# Patient Record
Sex: Female | Born: 1945 | Race: Black or African American | Hispanic: No | Marital: Single | Smoking: Never smoker
Health system: Southern US, Community
[De-identification: ages and names within clinical notes are randomized; demographics above are authoritative.]

## PROBLEM LIST (undated history)

## (undated) DIAGNOSIS — F0390 Unspecified dementia without behavioral disturbance: Secondary | ICD-10-CM

## (undated) DIAGNOSIS — E079 Disorder of thyroid, unspecified: Secondary | ICD-10-CM

## (undated) DIAGNOSIS — H4720 Unspecified optic atrophy: Secondary | ICD-10-CM

## (undated) DIAGNOSIS — R1310 Dysphagia, unspecified: Secondary | ICD-10-CM

## (undated) DIAGNOSIS — E785 Hyperlipidemia, unspecified: Secondary | ICD-10-CM

## (undated) DIAGNOSIS — H269 Unspecified cataract: Secondary | ICD-10-CM

## (undated) DIAGNOSIS — K029 Dental caries, unspecified: Secondary | ICD-10-CM

## (undated) DIAGNOSIS — E039 Hypothyroidism, unspecified: Secondary | ICD-10-CM

## (undated) DIAGNOSIS — A419 Sepsis, unspecified organism: Secondary | ICD-10-CM

## (undated) DIAGNOSIS — I2510 Atherosclerotic heart disease of native coronary artery without angina pectoris: Secondary | ICD-10-CM

## (undated) DIAGNOSIS — R569 Unspecified convulsions: Secondary | ICD-10-CM

## (undated) DIAGNOSIS — E7800 Pure hypercholesterolemia, unspecified: Secondary | ICD-10-CM

## (undated) DIAGNOSIS — I959 Hypotension, unspecified: Secondary | ICD-10-CM

## (undated) DIAGNOSIS — D649 Anemia, unspecified: Secondary | ICD-10-CM

## (undated) DIAGNOSIS — I509 Heart failure, unspecified: Secondary | ICD-10-CM

## (undated) DIAGNOSIS — G8220 Paraplegia, unspecified: Secondary | ICD-10-CM

## (undated) DIAGNOSIS — K219 Gastro-esophageal reflux disease without esophagitis: Secondary | ICD-10-CM

## (undated) DIAGNOSIS — I209 Angina pectoris, unspecified: Secondary | ICD-10-CM

## (undated) DIAGNOSIS — I480 Paroxysmal atrial fibrillation: Secondary | ICD-10-CM

## (undated) HISTORY — DX: Disorder of thyroid, unspecified: E07.9

## (undated) HISTORY — DX: Unspecified convulsions: R56.9

## (undated) HISTORY — PX: OTHER SURGICAL HISTORY: SHX169

## (undated) HISTORY — DX: Paraplegia, unspecified: G82.20

## (undated) HISTORY — DX: Unspecified cataract: H26.9

## (undated) HISTORY — PX: GASTROSTOMY TUBE PLACEMENT: SHX655

## (undated) HISTORY — DX: Pure hypercholesterolemia, unspecified: E78.00

## (undated) HISTORY — DX: Atherosclerotic heart disease of native coronary artery without angina pectoris: I25.10

---

## 1998-02-02 ENCOUNTER — Encounter (HOSPITAL_COMMUNITY): Admission: RE | Admit: 1998-02-02 | Discharge: 1998-05-05 | Payer: Self-pay | Admitting: Family Medicine

## 1999-05-20 ENCOUNTER — Emergency Department (HOSPITAL_COMMUNITY): Admission: EM | Admit: 1999-05-20 | Discharge: 1999-05-20 | Payer: Self-pay | Admitting: Emergency Medicine

## 1999-10-22 ENCOUNTER — Emergency Department (HOSPITAL_COMMUNITY): Admission: EM | Admit: 1999-10-22 | Discharge: 1999-10-23 | Payer: Self-pay | Admitting: *Deleted

## 2000-06-12 ENCOUNTER — Encounter: Payer: Self-pay | Admitting: Family Medicine

## 2000-06-12 ENCOUNTER — Encounter: Admission: RE | Admit: 2000-06-12 | Discharge: 2000-06-12 | Payer: Self-pay | Admitting: Family Medicine

## 2001-07-04 ENCOUNTER — Encounter: Admission: RE | Admit: 2001-07-04 | Discharge: 2001-07-04 | Payer: Self-pay | Admitting: Obstetrics & Gynecology

## 2001-07-04 ENCOUNTER — Encounter: Payer: Self-pay | Admitting: Family Medicine

## 2002-08-05 ENCOUNTER — Encounter: Admission: RE | Admit: 2002-08-05 | Discharge: 2002-08-05 | Payer: Self-pay | Admitting: Family Medicine

## 2003-02-15 ENCOUNTER — Encounter: Payer: Self-pay | Admitting: Family Medicine

## 2003-02-15 ENCOUNTER — Encounter: Admission: RE | Admit: 2003-02-15 | Discharge: 2003-02-15 | Payer: Self-pay | Admitting: Family Medicine

## 2004-02-11 ENCOUNTER — Ambulatory Visit (HOSPITAL_COMMUNITY): Admission: RE | Admit: 2004-02-11 | Discharge: 2004-02-11 | Payer: Self-pay | Admitting: Urology

## 2004-03-31 ENCOUNTER — Encounter: Admission: RE | Admit: 2004-03-31 | Discharge: 2004-03-31 | Payer: Self-pay | Admitting: Family Medicine

## 2005-04-04 ENCOUNTER — Encounter: Admission: RE | Admit: 2005-04-04 | Discharge: 2005-04-04 | Payer: Self-pay | Admitting: Family Medicine

## 2006-05-17 ENCOUNTER — Encounter: Admission: RE | Admit: 2006-05-17 | Discharge: 2006-05-17 | Payer: Self-pay | Admitting: Family Medicine

## 2006-08-26 ENCOUNTER — Encounter: Admission: RE | Admit: 2006-08-26 | Discharge: 2006-08-26 | Payer: Self-pay | Admitting: Family Medicine

## 2006-09-10 ENCOUNTER — Encounter: Admission: RE | Admit: 2006-09-10 | Discharge: 2006-09-10 | Payer: Self-pay | Admitting: Family Medicine

## 2006-10-23 ENCOUNTER — Emergency Department (HOSPITAL_COMMUNITY): Admission: EM | Admit: 2006-10-23 | Discharge: 2006-10-23 | Payer: Self-pay | Admitting: Emergency Medicine

## 2006-10-25 ENCOUNTER — Emergency Department (HOSPITAL_COMMUNITY): Admission: EM | Admit: 2006-10-25 | Discharge: 2006-10-26 | Payer: Self-pay | Admitting: Emergency Medicine

## 2007-05-16 ENCOUNTER — Encounter: Admission: RE | Admit: 2007-05-16 | Discharge: 2007-05-16 | Payer: Self-pay | Admitting: Family Medicine

## 2007-05-19 ENCOUNTER — Encounter: Admission: RE | Admit: 2007-05-19 | Discharge: 2007-05-19 | Payer: Self-pay | Admitting: Family Medicine

## 2007-05-30 ENCOUNTER — Encounter: Admission: RE | Admit: 2007-05-30 | Discharge: 2007-05-30 | Payer: Self-pay | Admitting: Family Medicine

## 2007-06-04 ENCOUNTER — Encounter: Admission: RE | Admit: 2007-06-04 | Discharge: 2007-06-04 | Payer: Self-pay | Admitting: Family Medicine

## 2007-06-20 ENCOUNTER — Encounter: Admission: RE | Admit: 2007-06-20 | Discharge: 2007-06-20 | Payer: Self-pay | Admitting: Family Medicine

## 2007-12-05 ENCOUNTER — Ambulatory Visit (HOSPITAL_COMMUNITY): Admission: RE | Admit: 2007-12-05 | Discharge: 2007-12-05 | Payer: Self-pay | Admitting: Family Medicine

## 2008-01-14 ENCOUNTER — Encounter: Admission: RE | Admit: 2008-01-14 | Discharge: 2008-01-14 | Payer: Self-pay | Admitting: Family Medicine

## 2008-05-24 ENCOUNTER — Encounter: Admission: RE | Admit: 2008-05-24 | Discharge: 2008-05-24 | Payer: Self-pay | Admitting: Family Medicine

## 2008-06-29 ENCOUNTER — Ambulatory Visit (HOSPITAL_COMMUNITY): Admission: RE | Admit: 2008-06-29 | Discharge: 2008-06-29 | Payer: Self-pay | Admitting: Family Medicine

## 2008-09-27 ENCOUNTER — Encounter: Admission: RE | Admit: 2008-09-27 | Discharge: 2008-09-27 | Payer: Self-pay | Admitting: Family Medicine

## 2008-11-11 ENCOUNTER — Ambulatory Visit: Payer: Self-pay | Admitting: Gynecology

## 2008-11-11 ENCOUNTER — Encounter: Payer: Self-pay | Admitting: Gynecology

## 2008-11-11 ENCOUNTER — Other Ambulatory Visit: Admission: RE | Admit: 2008-11-11 | Discharge: 2008-11-11 | Payer: Self-pay | Admitting: Gynecology

## 2009-05-30 ENCOUNTER — Encounter: Admission: RE | Admit: 2009-05-30 | Discharge: 2009-05-30 | Payer: Self-pay | Admitting: Family Medicine

## 2010-08-24 ENCOUNTER — Encounter: Admission: RE | Admit: 2010-08-24 | Discharge: 2010-08-24 | Payer: Self-pay | Admitting: Family Medicine

## 2010-11-04 ENCOUNTER — Encounter: Payer: Self-pay | Admitting: Family Medicine

## 2010-11-05 ENCOUNTER — Encounter: Payer: Self-pay | Admitting: Family Medicine

## 2010-12-20 ENCOUNTER — Other Ambulatory Visit (HOSPITAL_COMMUNITY): Payer: Self-pay | Admitting: Gastroenterology

## 2010-12-20 DIAGNOSIS — R634 Abnormal weight loss: Secondary | ICD-10-CM

## 2010-12-29 ENCOUNTER — Ambulatory Visit (HOSPITAL_COMMUNITY)
Admission: RE | Admit: 2010-12-29 | Discharge: 2010-12-29 | Disposition: A | Discharge status: Home / Self Care | Payer: Medicare Other | Source: Ambulatory Visit | Attending: Gastroenterology | Admitting: Gastroenterology

## 2010-12-29 DIAGNOSIS — R918 Other nonspecific abnormal finding of lung field: Secondary | ICD-10-CM | POA: Insufficient documentation

## 2010-12-29 DIAGNOSIS — I517 Cardiomegaly: Secondary | ICD-10-CM | POA: Insufficient documentation

## 2010-12-29 DIAGNOSIS — N269 Renal sclerosis, unspecified: Secondary | ICD-10-CM | POA: Insufficient documentation

## 2010-12-29 DIAGNOSIS — R634 Abnormal weight loss: Secondary | ICD-10-CM | POA: Insufficient documentation

## 2010-12-29 DIAGNOSIS — J9819 Other pulmonary collapse: Secondary | ICD-10-CM | POA: Insufficient documentation

## 2010-12-29 DIAGNOSIS — M4120 Other idiopathic scoliosis, site unspecified: Secondary | ICD-10-CM | POA: Insufficient documentation

## 2010-12-29 MED ORDER — IOHEXOL 300 MG/ML  SOLN
100.0000 mL | Freq: Once | INTRAMUSCULAR | Status: AC | PRN
  Administered 2010-12-29: 100 mL via INTRAVENOUS

## 2011-03-02 NOTE — Op Note (Signed)
NAME:  Dawn [name], Dawn [name]                      ACCOUNT NO.:  [id number]   MEDICAL RECORD NO.:  [id number]                   PATIENT TYPE:  AMB   LOCATION:  DAY                                  FACILITY:  [facility]   PHYSICIAN:  Lindaann Slough, M.D.               DATE OF BIRTH:  03-Jul-1946   DATE OF PROCEDURE:  02/11/2004  DATE OF DISCHARGE:                                 OPERATIVE REPORT   PREOPERATIVE DIAGNOSIS:  Recurrent urinary tract infection and left  hydronephrosis.   POSTOPERATIVE DIAGNOSIS:  Recurrent urinary tract infection and left  hydronephrosis secondary to ureteropelvic junction obstruction and meatal  stenosis.   PROCEDURE:  Cystoscopy, urethral dilation, left retrograde pyelogram, and  insertion of double-J catheter.   SURGEON:  Lindaann Slough, M.D.   ANESTHESIA:  General anesthesia.   INDICATIONS FOR PROCEDURE:  The patient is a 65 year old female with  cerebral palsy who has a history of recurrent urinary tract infections.  Renal ultrasound showed mild left hydronephrosis.  The patient is scheduled  today for cystoscopy and retrograde pyelogram.   DESCRIPTION OF PROCEDURE:  Under general anesthesia, the patient was prepped  and draped and placed in the dorsal lithotomy position.  A #22 Wappler  cystoscope could not inserted in the bladder because of meatal stenosis.  The urethra was then dilated with #32-French sound.  Then, the cystoscope  was passed into the bladder.  The bladder mucosa was reddened.  There was no  stone or tumor in the bladder.  The ureters were in normal position and  shape with clear efflux.  A cone-tipped catheter was then passed through the  cystoscope into the left ureteral orifice.  Contrast had been injected  through the cone-tipped catheter.  The ureter was normal.  There was  narrowing of UPJ with dilation of the renal pelvis and the collecting  system.  The cone-tipped catheter was then removed.  A glide wire was passed  through  an open-end catheter and the glide wire was passed through the  cystoscope and into the ureter.  Then the glide wire was passed up to the  UPJ and the open-end catheter was advanced over the UPJ into the renal  pelvis.  The glide wire was then removed and replaced with a guide wire and  the open-end catheter was removed.  A #6-24 double-J catheter was then  passed over the guide wire.  The proximal curl of the double-J  catheter is in the renal pelvis.  The distal curl is in the bladder.  The  bladder was then emptied and cystoscope removed.   The patient tolerated the procedure well and left the OR in satisfactory  condition to the post-anesthesia care unit.  Lindaann Slough, M.D.    MN/MEDQ  D:  02/11/2004  T:  02/11/2004  Job:  [id number]   cc:   Teena Irani. Arlyce Dice, M.D.  [id number]  [place]  [place] [address]  Fax: [phone]

## 2011-08-15 ENCOUNTER — Other Ambulatory Visit: Payer: Self-pay | Admitting: Family Medicine

## 2011-08-15 DIAGNOSIS — Z1231 Encounter for screening mammogram for malignant neoplasm of breast: Secondary | ICD-10-CM

## 2011-09-13 ENCOUNTER — Ambulatory Visit
Admission: RE | Admit: 2011-09-13 | Discharge: 2011-09-13 | Disposition: A | Discharge status: Home / Self Care | Payer: Medicare Other | Source: Ambulatory Visit | Attending: Family Medicine | Admitting: Family Medicine

## 2011-09-13 DIAGNOSIS — Z1231 Encounter for screening mammogram for malignant neoplasm of breast: Secondary | ICD-10-CM

## 2012-01-03 ENCOUNTER — Encounter: Payer: Medicare Other | Admitting: Gynecology

## 2012-01-15 ENCOUNTER — Encounter: Payer: Medicare Other | Admitting: Gynecology

## 2012-02-12 ENCOUNTER — Ambulatory Visit (INDEPENDENT_AMBULATORY_CARE_PROVIDER_SITE_OTHER): Payer: Medicare Other | Admitting: Gynecology

## 2012-02-12 ENCOUNTER — Encounter: Payer: Self-pay | Admitting: Gynecology

## 2012-02-12 DIAGNOSIS — N952 Postmenopausal atrophic vaginitis: Secondary | ICD-10-CM

## 2012-02-12 DIAGNOSIS — M810 Age-related osteoporosis without current pathological fracture: Secondary | ICD-10-CM

## 2012-02-12 NOTE — Patient Instructions (Signed)
Follow-up in one year.

## 2012-02-12 NOTE — Progress Notes (Signed)
66 year old G0 follow up exam from [facility] with a number of issues to include paraparesis, seizure disorder, mental retardation, cerebral palsy, wheelchair-bound. She is not having any issues but it has been 3 years since her last exam and she presents for routine examination. No history of vaginal bleeding reported by the nursing staff. Recently had mammography November 2012. Pap smear 2010 was negative.  She does have a large midline scar the nursing staff is unsure why that was done and I was unable to locate this in her medical chart.  GYN exam with Sherrilyn Rist chaperone present Breasts:  Right/left without masses retractions discharge adenopathy. Abdomen:  Midline scar.  Without masses, tenderness, rebound, guarding or organomegaly. Pelvic:  External with old apparent left labia minora tear. Atrophic changes noted. BUS vagina atrophic. Bimanual without gross masses or tenderness. Somewhat limited by voluntary guarding. Rectovaginal exam is normal.  Assessment and plan:  15. 66 year old with grossly normal GYN exam. Given the lack of other symptoms to include bleeding or visual/palpable abnormalities I do not feel any further evaluation needed at this point. Pap smear was not done. Discussed with nurses who accompanied her at age 66 without a history of abnormalities and given her clinical situation I feel no further Pap smears needed.  Did recommend though clinical exam annually particularly with breast exam and cursory pelvic. Need to report any vaginal bleeding or other abnormalities.  Continue with annual mammography. Colonoscopy at her primary physician's recommendation. Does have a history of osteoporosis with DEXA 2009 showing a T score of -3.7. I think given her situation some form of treatment particularly with her risk of falling would be prudent. If weekly bisphosphonate or monthly oral not possible alternatives such as once a year IV Reclast or twice yearly Prolia subcutaneous  injection would be suggested. I will leave that decision to her primary physician's discretion.

## 2012-05-30 ENCOUNTER — Other Ambulatory Visit (HOSPITAL_COMMUNITY): Payer: Self-pay | Admitting: Family Medicine

## 2012-05-30 DIAGNOSIS — R569 Unspecified convulsions: Secondary | ICD-10-CM

## 2012-06-25 ENCOUNTER — Ambulatory Visit (HOSPITAL_COMMUNITY)
Admission: RE | Admit: 2012-06-25 | Discharge: 2012-06-25 | Disposition: A | Discharge status: Home / Self Care | Payer: Medicare Other | Source: Ambulatory Visit | Attending: Family Medicine | Admitting: Family Medicine

## 2012-06-25 DIAGNOSIS — Z1389 Encounter for screening for other disorder: Secondary | ICD-10-CM | POA: Insufficient documentation

## 2012-06-25 DIAGNOSIS — R569 Unspecified convulsions: Secondary | ICD-10-CM

## 2012-06-25 NOTE — Progress Notes (Signed)
EEG completed.

## 2012-06-27 NOTE — Procedures (Signed)
EEG# [id number]  This routine EEG was requested in this 66 year old female with a history of mental retardation and frequent seizures.  She has episodes of body jerking followed by confusion and sleepiness.  Medications include Keppra and Depakote.  The EEG was done with the patient awake and drowsy.  Periods of maximal wakefulness were difficulty to interpret due to significant muscle artifact.  However, it did appear to be composed of low amplitude beta activities with no clear posterior dominant rhythm.  There was no photic driving response.  During drowsiness background activities were composed of diffuse theta and delta activities that had overlying frontally dominant as well as diffuse beta activities.  At times slowing appeared to be more pronounced over left hemisphere in particular the posterior regions while at other times the right hemisphere appeared slightly slower.  There did appear to be less fast activities consistently over the right hemisphere.  Infrequent left central parietal sharp waves were seen.  Clinical Interpretation:  This routine EEG done with the patient drowsy and awake is abnormal.  There are sharp waves best seen in the left central parietal region suggesting a possible source of seizures. There does appear to be multifocal slowing during drowsiness, but this is hard to discern due to generalized slowing related to the drowsy state.  An asymmetry of faster activities during drowsiness with faster activities being more prominent over the left hemisphere may suggest that the likely diffuse cortical dysfunction is worse over the right hemisphere.  Lupita Raider Modesto Charon, MD [facility], [place]

## 2012-07-08 ENCOUNTER — Ambulatory Visit (HOSPITAL_COMMUNITY): Admission: RE | Admit: 2012-07-08 | Payer: Medicare Other | Source: Ambulatory Visit

## 2012-07-29 ENCOUNTER — Ambulatory Visit (HOSPITAL_COMMUNITY)
Admission: RE | Admit: 2012-07-29 | Discharge: 2012-07-29 | Disposition: A | Discharge status: Home / Self Care | Payer: Medicare Other | Source: Ambulatory Visit | Attending: Family Medicine | Admitting: Family Medicine

## 2012-07-29 DIAGNOSIS — F79 Unspecified intellectual disabilities: Secondary | ICD-10-CM | POA: Insufficient documentation

## 2012-07-29 DIAGNOSIS — R011 Cardiac murmur, unspecified: Secondary | ICD-10-CM | POA: Insufficient documentation

## 2012-07-29 DIAGNOSIS — G809 Cerebral palsy, unspecified: Secondary | ICD-10-CM | POA: Insufficient documentation

## 2012-07-29 NOTE — Progress Notes (Signed)
  Echocardiogram 2D Echocardiogram has been performed.  Azarian Starace 07/29/2012, 1:40 PM

## 2012-08-27 ENCOUNTER — Other Ambulatory Visit: Payer: Self-pay | Admitting: Family Medicine

## 2012-08-27 DIAGNOSIS — Z1231 Encounter for screening mammogram for malignant neoplasm of breast: Secondary | ICD-10-CM

## 2012-10-10 ENCOUNTER — Ambulatory Visit: Payer: Medicare Other

## 2012-10-30 ENCOUNTER — Ambulatory Visit: Payer: Medicare Other

## 2012-11-06 ENCOUNTER — Ambulatory Visit: Payer: Medicare Other

## 2012-11-14 ENCOUNTER — Ambulatory Visit: Payer: Medicare Other

## 2012-12-05 ENCOUNTER — Ambulatory Visit
Admission: RE | Admit: 2012-12-05 | Discharge: 2012-12-05 | Disposition: A | Discharge status: Home / Self Care | Payer: Medicare Other | Source: Ambulatory Visit | Attending: Family Medicine | Admitting: Family Medicine

## 2012-12-05 DIAGNOSIS — Z1231 Encounter for screening mammogram for malignant neoplasm of breast: Secondary | ICD-10-CM

## 2013-03-23 ENCOUNTER — Encounter: Payer: Self-pay | Admitting: Gynecology

## 2013-10-28 ENCOUNTER — Inpatient Hospital Stay (HOSPITAL_COMMUNITY)
Admission: EM | Admit: 2013-10-28 | Discharge: 2013-11-02 | DRG: 281 | Disposition: A | Discharge status: Home / Self Care | Payer: Medicare Other | Attending: Internal Medicine | Admitting: Internal Medicine

## 2013-10-28 ENCOUNTER — Emergency Department (HOSPITAL_COMMUNITY): Payer: Medicare Other

## 2013-10-28 ENCOUNTER — Encounter (HOSPITAL_COMMUNITY): Payer: Self-pay | Admitting: Emergency Medicine

## 2013-10-28 DIAGNOSIS — M810 Age-related osteoporosis without current pathological fracture: Secondary | ICD-10-CM | POA: Diagnosis present

## 2013-10-28 DIAGNOSIS — I5030 Unspecified diastolic (congestive) heart failure: Secondary | ICD-10-CM

## 2013-10-28 DIAGNOSIS — E162 Hypoglycemia, unspecified: Secondary | ICD-10-CM | POA: Diagnosis not present

## 2013-10-28 DIAGNOSIS — G40909 Epilepsy, unspecified, not intractable, without status epilepticus: Secondary | ICD-10-CM

## 2013-10-28 DIAGNOSIS — H4720 Unspecified optic atrophy: Secondary | ICD-10-CM | POA: Diagnosis present

## 2013-10-28 DIAGNOSIS — N390 Urinary tract infection, site not specified: Secondary | ICD-10-CM | POA: Diagnosis present

## 2013-10-28 DIAGNOSIS — R799 Abnormal finding of blood chemistry, unspecified: Secondary | ICD-10-CM

## 2013-10-28 DIAGNOSIS — E039 Hypothyroidism, unspecified: Secondary | ICD-10-CM

## 2013-10-28 DIAGNOSIS — Z79899 Other long term (current) drug therapy: Secondary | ICD-10-CM

## 2013-10-28 DIAGNOSIS — D696 Thrombocytopenia, unspecified: Secondary | ICD-10-CM

## 2013-10-28 DIAGNOSIS — E785 Hyperlipidemia, unspecified: Secondary | ICD-10-CM

## 2013-10-28 DIAGNOSIS — R7989 Other specified abnormal findings of blood chemistry: Secondary | ICD-10-CM

## 2013-10-28 DIAGNOSIS — R627 Adult failure to thrive: Secondary | ICD-10-CM | POA: Diagnosis present

## 2013-10-28 DIAGNOSIS — D649 Anemia, unspecified: Secondary | ICD-10-CM

## 2013-10-28 DIAGNOSIS — B961 Klebsiella pneumoniae [K. pneumoniae] as the cause of diseases classified elsewhere: Secondary | ICD-10-CM | POA: Diagnosis present

## 2013-10-28 DIAGNOSIS — K219 Gastro-esophageal reflux disease without esophagitis: Secondary | ICD-10-CM | POA: Diagnosis present

## 2013-10-28 DIAGNOSIS — I959 Hypotension, unspecified: Secondary | ICD-10-CM

## 2013-10-28 DIAGNOSIS — E860 Dehydration: Secondary | ICD-10-CM

## 2013-10-28 DIAGNOSIS — I214 Non-ST elevation (NSTEMI) myocardial infarction: Secondary | ICD-10-CM

## 2013-10-28 DIAGNOSIS — R1310 Dysphagia, unspecified: Secondary | ICD-10-CM | POA: Diagnosis present

## 2013-10-28 DIAGNOSIS — H269 Unspecified cataract: Secondary | ICD-10-CM | POA: Diagnosis present

## 2013-10-28 DIAGNOSIS — E870 Hyperosmolality and hypernatremia: Secondary | ICD-10-CM | POA: Diagnosis present

## 2013-10-28 DIAGNOSIS — F79 Unspecified intellectual disabilities: Secondary | ICD-10-CM

## 2013-10-28 DIAGNOSIS — E46 Unspecified protein-calorie malnutrition: Secondary | ICD-10-CM | POA: Diagnosis present

## 2013-10-28 DIAGNOSIS — I4891 Unspecified atrial fibrillation: Principal | ICD-10-CM

## 2013-10-28 DIAGNOSIS — R778 Other specified abnormalities of plasma proteins: Secondary | ICD-10-CM

## 2013-10-28 HISTORY — DX: Angina pectoris, unspecified: I20.9

## 2013-10-28 HISTORY — DX: Hypothyroidism, unspecified: E03.9

## 2013-10-28 LAB — CBC WITH DIFFERENTIAL/PLATELET
Basophils Absolute: 0 10*3/uL (ref 0.0–0.1)
Basophils Relative: 0 % (ref 0–1)
EOS ABS: 0.1 10*3/uL (ref 0.0–0.7)
EOS PCT: 2 % (ref 0–5)
HCT: 32.2 % — ABNORMAL LOW (ref 36.0–46.0)
Hemoglobin: 10.7 g/dL — ABNORMAL LOW (ref 12.0–15.0)
LYMPHS ABS: 2.1 10*3/uL (ref 0.7–4.0)
Lymphocytes Relative: 28 % (ref 12–46)
MCH: 33.5 pg (ref 26.0–34.0)
MCHC: 33.2 g/dL (ref 30.0–36.0)
MCV: 100.9 fL — ABNORMAL HIGH (ref 78.0–100.0)
Monocytes Absolute: 0.6 10*3/uL (ref 0.1–1.0)
Monocytes Relative: 7 % (ref 3–12)
NEUTROS PCT: 63 % (ref 43–77)
Neutro Abs: 4.8 10*3/uL (ref 1.7–7.7)
PLATELETS: 95 10*3/uL — AB (ref 150–400)
RBC: 3.19 MIL/uL — AB (ref 3.87–5.11)
RDW: 15.7 % — AB (ref 11.5–15.5)
WBC: 7.6 10*3/uL (ref 4.0–10.5)

## 2013-10-28 LAB — COMPREHENSIVE METABOLIC PANEL
ALT: 36 U/L — AB (ref 0–35)
AST: 42 U/L — AB (ref 0–37)
Albumin: 2.5 g/dL — ABNORMAL LOW (ref 3.5–5.2)
Alkaline Phosphatase: 133 U/L — ABNORMAL HIGH (ref 39–117)
BUN: 26 mg/dL — ABNORMAL HIGH (ref 6–23)
CO2: 29 mEq/L (ref 19–32)
Calcium: 9.2 mg/dL (ref 8.4–10.5)
Chloride: 107 mEq/L (ref 96–112)
Creatinine, Ser: 0.6 mg/dL (ref 0.50–1.10)
GFR calc non Af Amer: >90 mL/min (ref >90)
GLUCOSE: 109 mg/dL — AB (ref 70–99)
POTASSIUM: 4 meq/L (ref 3.7–5.3)
SODIUM: 148 meq/L — AB (ref 137–147)
TOTAL PROTEIN: 6.7 g/dL (ref 6.0–8.3)
Total Bilirubin: <0.2 mg/dL — ABNORMAL LOW (ref 0.3–1.2)

## 2013-10-28 LAB — PROTIME-INR
INR: 1.05 (ref 0.00–1.49)
Prothrombin Time: 13.5 seconds (ref 11.6–15.2)

## 2013-10-28 LAB — TROPONIN I
TROPONIN I: 1.94 ng/mL — AB (ref <0.30)
Troponin I: 0.31 ng/mL (ref <0.30)

## 2013-10-28 LAB — GLUCOSE, CAPILLARY
Glucose-Capillary: 104 mg/dL — ABNORMAL HIGH (ref 70–99)
Glucose-Capillary: 67 mg/dL — ABNORMAL LOW (ref 70–99)
Glucose-Capillary: 99 mg/dL (ref 70–99)

## 2013-10-28 LAB — VALPROIC ACID LEVEL: Valproic Acid Lvl: 63.5 ug/mL (ref 50.0–100.0)

## 2013-10-28 LAB — MAGNESIUM: Magnesium: 1.8 mg/dL (ref 1.5–2.5)

## 2013-10-28 LAB — APTT: APTT: 32 s (ref 24–37)

## 2013-10-28 LAB — PRO B NATRIURETIC PEPTIDE: Pro B Natriuretic peptide (BNP): 488.2 pg/mL — ABNORMAL HIGH (ref 0–125)

## 2013-10-28 LAB — MRSA PCR SCREENING: MRSA by PCR: NEGATIVE

## 2013-10-28 MED ORDER — SODIUM CHLORIDE 0.9 % IV BOLUS (SEPSIS)
1000.0000 mL | Freq: Once | INTRAVENOUS | Status: AC
  Administered 2013-10-28: 1000 mL via INTRAVENOUS

## 2013-10-28 MED ORDER — HEPARIN (PORCINE) IN NACL 100-0.45 UNIT/ML-% IJ SOLN
600.0000 [IU]/h | INTRAMUSCULAR | Status: DC
  Administered 2013-10-28: 750 [IU]/h via INTRAVENOUS
  Filled 2013-10-28: qty 250

## 2013-10-28 MED ORDER — DIGOXIN 125 MCG PO TABS
0.1250 mg | ORAL_TABLET | Freq: Once | ORAL | Status: AC
  Administered 2013-10-29: 0.125 mg via ORAL
  Filled 2013-10-28: qty 1

## 2013-10-28 MED ORDER — LEVETIRACETAM 750 MG PO TABS
750.0000 mg | ORAL_TABLET | Freq: Three times a day (TID) | ORAL | Status: DC
  Administered 2013-10-28 – 2013-11-02 (×14): 750 mg via ORAL
  Filled 2013-10-28 (×17): qty 1

## 2013-10-28 MED ORDER — DEXTROSE-NACL 5-0.45 % IV SOLN
INTRAVENOUS | Status: DC
  Administered 2013-10-28: 21:00:00 via INTRAVENOUS

## 2013-10-28 MED ORDER — ATORVASTATIN CALCIUM 10 MG PO TABS
10.0000 mg | ORAL_TABLET | Freq: Every day | ORAL | Status: DC
  Administered 2013-10-28 – 2013-11-02 (×6): 10 mg via ORAL
  Filled 2013-10-28 (×6): qty 1

## 2013-10-28 MED ORDER — ASPIRIN EC 81 MG PO TBEC
81.0000 mg | DELAYED_RELEASE_TABLET | Freq: Every day | ORAL | Status: DC
  Administered 2013-10-28 – 2013-11-02 (×6): 81 mg via ORAL
  Filled 2013-10-28 (×6): qty 1

## 2013-10-28 MED ORDER — SODIUM CHLORIDE 0.9 % IV SOLN
INTRAVENOUS | Status: DC
  Administered 2013-10-28: 16:00:00 via INTRAVENOUS
  Administered 2013-10-29: 125 mL/h via INTRAVENOUS
  Administered 2013-10-29 – 2013-10-30 (×3): via INTRAVENOUS
  Administered 2013-10-30: 125 mL/h via INTRAVENOUS
  Administered 2013-10-31: 03:00:00 via INTRAVENOUS

## 2013-10-28 MED ORDER — DILTIAZEM HCL 100 MG IV SOLR
5.0000 mg/h | INTRAVENOUS | Status: DC
  Filled 2013-10-28: qty 100

## 2013-10-28 MED ORDER — LEVOTHYROXINE SODIUM 75 MCG PO TABS
75.0000 ug | ORAL_TABLET | Freq: Every day | ORAL | Status: DC
  Administered 2013-10-29 – 2013-11-02 (×5): 75 ug via ORAL
  Filled 2013-10-28 (×5): qty 1

## 2013-10-28 MED ORDER — CAMPHOR-MENTHOL 0.5-0.5 % EX LOTN: 1.0000 "application " | TOPICAL_LOTION | CUTANEOUS | Status: DC | PRN

## 2013-10-28 MED ORDER — ACETAMINOPHEN 325 MG PO TABS
650.0000 mg | ORAL_TABLET | Freq: Four times a day (QID) | ORAL | Status: DC | PRN
  Filled 2013-10-28: qty 2

## 2013-10-28 MED ORDER — ACETAMINOPHEN 650 MG RE SUPP: 650.0000 mg | Freq: Four times a day (QID) | RECTAL | Status: DC | PRN

## 2013-10-28 MED ORDER — ONDANSETRON HCL 4 MG/2ML IJ SOLN: 4.0000 mg | Freq: Four times a day (QID) | INTRAMUSCULAR | Status: DC | PRN

## 2013-10-28 MED ORDER — ONDANSETRON HCL 4 MG PO TABS: 4.0000 mg | ORAL_TABLET | Freq: Four times a day (QID) | ORAL | Status: DC | PRN

## 2013-10-28 MED ORDER — CALCIUM CARBONATE-VITAMIN D 500-200 MG-UNIT PO TABS
1.0000 | ORAL_TABLET | Freq: Every day | ORAL | Status: DC
  Administered 2013-10-29 – 2013-11-02 (×5): 1 via ORAL
  Filled 2013-10-28 (×5): qty 1

## 2013-10-28 MED ORDER — DIVALPROEX SODIUM 500 MG PO DR TAB
750.0000 mg | DELAYED_RELEASE_TABLET | Freq: Three times a day (TID) | ORAL | Status: DC
  Administered 2013-10-28 – 2013-11-02 (×14): 750 mg via ORAL
  Filled 2013-10-28 (×18): qty 1

## 2013-10-28 MED ORDER — VITAMIN C 500 MG PO TABS
500.0000 mg | ORAL_TABLET | Freq: Three times a day (TID) | ORAL | Status: DC
  Administered 2013-10-28 – 2013-11-02 (×14): 500 mg via ORAL
  Filled 2013-10-28 (×17): qty 1

## 2013-10-28 MED ORDER — DIGOXIN 0.25 MG/ML IJ SOLN: 0.1250 mg | Freq: Once | INTRAMUSCULAR | Status: DC

## 2013-10-28 MED ORDER — PANTOPRAZOLE SODIUM 40 MG PO TBEC
40.0000 mg | DELAYED_RELEASE_TABLET | Freq: Every day | ORAL | Status: DC
  Administered 2013-10-29 – 2013-11-02 (×4): 40 mg via ORAL
  Filled 2013-10-28 (×4): qty 1

## 2013-10-28 MED ORDER — DILTIAZEM HCL 100 MG IV SOLR: 5.0000 mg/h | Freq: Once | INTRAVENOUS | Status: DC

## 2013-10-28 MED ORDER — HEPARIN BOLUS VIA INFUSION
2500.0000 [IU] | Freq: Once | INTRAVENOUS | Status: AC
Start: 2013-10-28 — End: 2013-10-28
  Administered 2013-10-28: 2500 [IU] via INTRAVENOUS
  Filled 2013-10-28: qty 2500

## 2013-10-28 NOTE — ED Provider Notes (Signed)
CSN: [id number]     Arrival date & time 10/28/13  1513 History   First MD Initiated Contact with Patient 10/28/13 1517     Chief Complaint  Patient presents with  . Seizures   (Consider location/radiation/quality/duration/timing/severity/associated sxs/prior Treatment) Patient is a 68 y.o. female presenting with seizures. The history is provided by the EMS personnel. The history is limited by the condition of the patient.  Seizures  patient here after having a possible witnessed seizure at the nursing home. Patient does have a history of seizures. According to EMS, patient is noted in rocking back and forth. They observed was felt to be his seizure activity and did not look like a focal or generalized seizure. There was no postictal period. EMS did do an EKG which showed the patient to be in what is a new onset atrial fibrillation. Patient has a history of MR and cannot give any further history  Past Medical History  Diagnosis Date  . Seizures   . Osteoporosis   . Thyroid disease     hypothyroid  . High cholesterol   . Cataracts, bilateral   . Optic atrophy   . Paraparesis     mild   Past Surgical History  Procedure Laterality Date  . Midline incision     History reviewed. No pertinent family history. History  Substance Use Topics  . Smoking status: Never Smoker   . Smokeless tobacco: Never Used  . Alcohol Use: No   OB History   Grav Para Term Preterm Abortions TAB SAB Ect Mult Living   0              Review of Systems  Unable to perform ROS Neurological: Positive for seizures.    Allergies  Ppd  Home Medications   Current Outpatient Rx  Name  Route  Sig  Dispense  Refill  . atorvastatin (LIPITOR) 10 MG tablet   Oral   Take 10 mg by mouth daily.         . calcium-vitamin D (OYSTER CALCIUM 500 + D) 500-200 MG-UNIT per tablet   Oral   Take 1 tablet by mouth daily.         . camphor-menthol (SARNA) lotion   Topical   Apply 1 application topically 2 (two)  times daily.         . clobetasol (TEMOVATE) 0.05 % external solution   Topical   Apply 1 application topically at bedtime.          . divalproex (DEPAKOTE) 250 MG DR tablet   Oral   Take 750 mg by mouth 3 (three) times daily.          Marland Kitchen levETIRAcetam (KEPPRA) 750 MG tablet   Oral   Take 750 mg by mouth 3 (three) times daily.          Marland Kitchen levothyroxine (SYNTHROID, LEVOTHROID) 75 MCG tablet   Oral   Take 75 mcg by mouth daily.         . nitrofurantoin (MACRODANTIN) 50 MG capsule   Oral   Take 50 mg by mouth at bedtime.          Marland Kitchen tolnaftate (TINACTIN) 1 % spray   Topical   Apply 1 application topically at bedtime.          . vitamin C (ASCORBIC ACID) 500 MG tablet   Oral   Take 500 mg by mouth 3 (three) times daily.           There  were no vitals taken for this visit. Physical Exam  Nursing note and vitals reviewed. Constitutional: She appears well-developed and well-nourished.  Non-toxic appearance. No distress.  HENT:  Head: Normocephalic and atraumatic.  Eyes: Conjunctivae, EOM and lids are normal. Pupils are equal, round, and reactive to light.  Neck: Normal range of motion. Neck supple. No tracheal deviation present. No mass present.  Cardiovascular: Normal rate, regular rhythm and normal heart sounds.  Exam reveals no gallop.   No murmur heard. Pulmonary/Chest: Effort normal and breath sounds normal. No stridor. No respiratory distress. She has no decreased breath sounds. She has no wheezes. She has no rhonchi. She has no rales.  Abdominal: Soft. Normal appearance and bowel sounds are normal. She exhibits no distension. There is no tenderness. There is no rebound and no CVA tenderness.  Musculoskeletal: Normal range of motion. She exhibits no edema and no tenderness.  Neurological: She is alert. She exhibits normal muscle tone. GCS eye subscore is 4. GCS verbal subscore is 3. GCS motor subscore is 6.  Patient moves all 4 extremities  Skin: Skin is warm  and dry. No abrasion and no rash noted.  Psychiatric: Her affect is blunt. She is slowed.    ED Course  Procedures (including critical care time) Labs Review Labs Reviewed  TROPONIN I  CBC WITH DIFFERENTIAL  COMPREHENSIVE METABOLIC PANEL  VALPROIC ACID LEVEL  PROTIME-INR  APTT   Imaging Review Dg Chest Port 1 View  10/28/2013   CLINICAL DATA:  Seizure with pain  EXAM: PORTABLE CHEST - 1 VIEW  COMPARISON:  Chest CT December 29, 2010 and chest radiograph January 14, 2008  FINDINGS: The degree of inspiration is shallow. There is no edema or consolidation. Heart size and pulmonary vascularity are normal. No pneumothorax. No adenopathy. No bone lesions.  IMPRESSION: No edema or consolidation.  Note shallow degree of inspiration.   Electronically Signed   By: Lowella Grip M.D.   On: 10/28/2013 15:47    EKG Interpretation    Date/Time:  Wednesday October 28 2013 15:26:56 EST Ventricular Rate:  121 PR Interval:    QRS Duration: 92 QT Interval:  274 QTC Calculation: 389 R Axis:   30 Text Interpretation:  Atrial fibrillation Abnormal R-wave progression, early transition Repol abnrm suggests ischemia, diffuse leads Confirmed by [place] ([id number]) on 10/28/2013 3:55:58 PM            MDM  No diagnosis found. Patient has no prior history of CHF but does have an ejection fraction of 80% as noted by cardiac echo from 2013.  5:58 PM Patient given IV fluids here and remains in atrial fibrillation with rapid ventricular rate response of 240. She was started on Cardizem drip at 5 mg per hour. He was reassessed multiple times and heart rate has only slightly decreased. Cardizem drip was titrated up to 10 mg. She was also given an additional bolus of IV saline. I consult to cardiology due 2 the patient's elevated troponin. They recommended to continue to titrate the diltiazem drip to control the patient's rate. I spoke with the hospitalist service and they will come to admit the  patient.  CRITICAL CARE Performed by: Leota Jacobsen Total critical care time: 60 Critical care time was exclusive of separately billable procedures and treating other patients. Critical care was necessary to treat or prevent imminent or life-threatening deterioration. Critical care was time spent personally by me on the following activities: development of treatment plan with patient and/or surrogate as  well as nursing, discussions with consultants, evaluation of patient's response to treatment, examination of patient, obtaining history from patient or surrogate, ordering and performing treatments and interventions, ordering and review of laboratory studies, ordering and review of radiographic studies, pulse oximetry and re-evaluation of patient's condition.   Leota Jacobsen, MD 10/28/13 [phone]

## 2013-10-28 NOTE — H&P (Signed)
[facility] History and Physical  Dawn [name] [id number] DOB: [birth year] DOA: 10/28/2013  Referring physician: Dr. Zenia Resides PCP: Clint Guy, MD   Chief Complaint: a. Fib with RVR  HPI: Dawn [name] is a 68 y.o. female with pmh significant for HLD, MR and seizure disorder brought to ED from group home due to changes in usual behavior. Initially thought patient had seizure activity, but that was r/o by EMS as patient was able to look for her name after been called during activity and also no post ictal; found to be on a.fib with RVR and per staff not drinking a lot in past 1-2 days. Patient w/o fever, chills, nausea, vomiting, hematemesis, melena or hematochezia.  In ED confirmed to be on a.fib with RVR and with slight elevation on Troponin. TRH called to admit for further evaluation and treatment. New onset a. Fib.   Review of Systems:  unbale to be reviewed due to patient underlying MR. Per PE and records negative except as mentioned on HPI.  Past Medical History  Diagnosis Date  . Seizures   . Osteoporosis   . Thyroid disease     hypothyroid  . High cholesterol   . Cataracts, bilateral   . Optic atrophy   . Paraparesis     mild   Past Surgical History  Procedure Laterality Date  . Midline incision     Social History:  reports that she has never smoked. She has never used smokeless tobacco. She reports that she does not drink alcohol. Her drug history is not on file.  Allergies  Allergen Reactions  . Ppd [Tuberculin Purified Protein Derivative]     Per MAR    Family history: unable to reviewed due to patient mental status    Prior to Admission medications   Medication Sig Start Date End Date Taking? Authorizing Provider  atorvastatin (LIPITOR) 10 MG tablet Take 10 mg by mouth daily.   Yes Historical Provider, MD  calcium-vitamin D (OYSTER CALCIUM 500 + D) 500-200 MG-UNIT per tablet Take 1 tablet by mouth daily.   Yes Historical Provider, MD   camphor-menthol Timoteo Ace) lotion Apply 1 application topically 2 (two) times daily.   Yes Historical Provider, MD  clobetasol (TEMOVATE) 0.05 % external solution Apply 1 application topically at bedtime.    Yes Historical Provider, MD  divalproex (DEPAKOTE) 250 MG DR tablet Take 750 mg by mouth 3 (three) times daily.    Yes Historical Provider, MD  levETIRAcetam (KEPPRA) 750 MG tablet Take 750 mg by mouth 3 (three) times daily.    Yes Historical Provider, MD  levothyroxine (SYNTHROID, LEVOTHROID) 75 MCG tablet Take 75 mcg by mouth daily.   Yes Historical Provider, MD  nitrofurantoin (MACRODANTIN) 50 MG capsule Take 50 mg by mouth at bedtime.    Yes Historical Provider, MD  tolnaftate (TINACTIN) 1 % spray Apply 1 application topically at bedtime.    Yes Historical Provider, MD  vitamin C (ASCORBIC ACID) 500 MG tablet Take 500 mg by mouth 3 (three) times daily.    Yes Historical Provider, MD   Physical Exam: Filed Vitals:   10/28/13 1907  BP: 82/61  Pulse: 144  Resp: 19    BP 82/61  Pulse 144  Resp 19  SpO2 99%  General:  Appears calm and comfortable; slight dryness of MM; no fever. Nonverbal at baseline Eyes: PERRL, normal lids, irises & conjunctiva ENT: no drainage out of ears or nostrils, slight dryness of MM, no erythema or exudates inside her  mouth. Neck: no LAD, masses or thyromegaly Cardiovascular: soft SEM, no rubs or gallops; irregular EKG and telemetry: a.fib with RVR on EKG and tele  Respiratory: CTA bilaterally, no w/r/r. Normal respiratory effort. Abdomen: soft, nt/nd; positive BS Musculoskeletal: grossly normal tone Bilaterally, no joint swelling Neurologic: no new focal deficit appreciated; patient exam limited due to MR and inability to follow commands.          Labs on Admission:  Basic Metabolic Panel:  Recent Labs Lab 10/28/13 1547  NA 148*  K 4.0  CL 107  CO2 29  GLUCOSE 109*  BUN 26*  CREATININE 0.60  CALCIUM 9.2   Liver Function Tests:  Recent  Labs Lab 10/28/13 1547  AST 42*  ALT 36*  ALKPHOS 133*  BILITOT <0.2*  PROT 6.7  ALBUMIN 2.5*   CBC:  Recent Labs Lab 10/28/13 1547  WBC 7.6  NEUTROABS 4.8  HGB 10.7*  HCT 32.2*  MCV 100.9*  PLT 95*   Cardiac Enzymes:  Recent Labs Lab 10/28/13 1547  TROPONINI 0.31*    BNP (last 3 results)  Recent Labs  10/28/13 1547  PROBNP 488.2*   CBG:  Recent Labs Lab 10/28/13 1531  GLUCAP 104*    Radiological Exams on Admission: Dg Chest Port 1 View  10/28/2013   CLINICAL DATA:  Seizure with pain  EXAM: PORTABLE CHEST - 1 VIEW  COMPARISON:  Chest CT December 29, 2010 and chest radiograph January 14, 2008  FINDINGS: The degree of inspiration is shallow. There is no edema or consolidation. Heart size and pulmonary vascularity are normal. No pneumothorax. No adenopathy. No bone lesions.  IMPRESSION: No edema or consolidation.  Note shallow degree of inspiration.   Electronically Signed   By: Lowella Grip M.D.   On: 10/28/2013 15:47    EKG:  Ventricular Rate: 121  PR Interval:  QRS Duration: 92  QT Interval: 274  QTC Calculation: 389  R Axis: 30  Text Interpretation: Atrial fibrillation Abnormal R-wave progression  Assessment/Plan 1-Atrial fibrillation with rapid ventricular response: new onset. -patient EKG with a. Fib with RVR; no prior hx of rhythm disorder -admit to stepdown -IVF's (as patient mild dehydrated and with hypernatremia) -cardizem drip and heparin drip -will check 2-D echo, troponin and Thyroid function. -will check UA (to r/o infections); CXR w/o acute cardiopulmonary process -will cycle cardiac enzymes  2-Seizure disorder: will closely monitor for any seizure activity. -depakote level WNL  3-MR (mental retardation): no verbal at baseline. Will provide support care. -once medically stable plan is for patient to go back to group home  4-Hypothyroidism: continue synthroid for now. Will check TSH  5-HLD (hyperlipidemia): continue  statins  6-Elevated troponin:slightly elevated at 0.31 due to a. Fib most likely. -will start ASA and continue statins -will cycle CE'z and check 2-D echo -no B-blocker given soft BP and needs for cardizem to control rate.  7-GERD and GI prophylaxis: started on PPI    8-Dysphagia: continue dysphagia 1 and thin liquids as provided in outpatient setting.   Cardiology consulted by ED (Dr. Gwenlyn Found)  Code Status: Full Family Communication: nurse from group home at bedside and group home charge nurse by phone Disposition Plan: admit to stepdown, LOS > 2 midnights, inpatient  Time spent: 50 minutes  Jonatan Wilsey [facility] Pager [phone]

## 2013-10-28 NOTE — ED Notes (Signed)
Pt comes from a [place] home for seizures, per facility pt had 5 seizures in 21mins. This was not typical of her other seizures. Upon EMS arrival they witnessed the pt having agitation and purposeful movement, when they called pt's name during episode she was able to turn in their direction. Pt is non verbal per norm. When pt was put on monitor pt showed A-fib and she has no hx. 22 LAC

## 2013-10-28 NOTE — ED Notes (Addendum)
Receiving Nurse to call back after report.

## 2013-10-28 NOTE — Progress Notes (Signed)
ANTICOAGULATION CONSULT NOTE - Initial Consult  Pharmacy Consult for heparin Indication: atrial fibrillation  Allergies  Allergen Reactions  . Ppd [Tuberculin Purified Protein Derivative]     Per [facility]    Patient Measurements: Heparin Dosing Weight: 52.6 kg  Vital Signs: BP: 82/61 mmHg (01/14 1907) Pulse Rate: 144 (01/14 1907)  Labs:  Recent Labs  10/28/13 1547  HGB 10.7*  HCT 32.2*  PLT 95*  APTT 32  LABPROT 13.5  INR 1.05  CREATININE 0.60  TROPONINI 0.31*    Medical History: Past Medical History  Diagnosis Date  . Seizures   . Osteoporosis   . Thyroid disease     hypothyroid  . High cholesterol   . Cataracts, bilateral   . Optic atrophy   . Paraparesis     mild    Medications:  Prescriptions prior to admission  Medication Sig Dispense Refill  . atorvastatin (LIPITOR) 10 MG tablet Take 10 mg by mouth daily.      . calcium-vitamin D (OYSTER CALCIUM 500 + D) 500-200 MG-UNIT per tablet Take 1 tablet by mouth daily.      . camphor-menthol (SARNA) lotion Apply 1 application topically 2 (two) times daily.      . clobetasol (TEMOVATE) 0.05 % external solution Apply 1 application topically at bedtime.       . divalproex (DEPAKOTE) 250 MG DR tablet Take 750 mg by mouth 3 (three) times daily.       Marland Kitchen levETIRAcetam (KEPPRA) 750 MG tablet Take 750 mg by mouth 3 (three) times daily.       Marland Kitchen levothyroxine (SYNTHROID, LEVOTHROID) 75 MCG tablet Take 75 mcg by mouth daily.      . nitrofurantoin (MACRODANTIN) 50 MG capsule Take 50 mg by mouth at bedtime.       Marland Kitchen tolnaftate (TINACTIN) 1 % spray Apply 1 application topically at bedtime.       . vitamin C (ASCORBIC ACID) 500 MG tablet Take 500 mg by mouth 3 (three) times daily.         Assessment: Pt presented with seizures-found new onset afib with rvr to 240.  Rx consulted to dose heparin.  Hgb 10.7, plts 95.  No bleeding noted.  Goal of Therapy:  INR 2-3 Heparin level 0.3-0.7 units/ml Monitor platelets by  anticoagulation protocol: Yes   Plan:  Heparin 2500 units bolus x1 Heparin drip 750 units/hr Daily HL/CBC  Hughes Better, PharmD, BCPS Clinical Pharmacist 10/28/2013 8:15 PM

## 2013-10-29 ENCOUNTER — Encounter (HOSPITAL_COMMUNITY): Payer: Self-pay

## 2013-10-29 DIAGNOSIS — I959 Hypotension, unspecified: Secondary | ICD-10-CM

## 2013-10-29 DIAGNOSIS — I517 Cardiomegaly: Secondary | ICD-10-CM

## 2013-10-29 DIAGNOSIS — E860 Dehydration: Secondary | ICD-10-CM | POA: Diagnosis present

## 2013-10-29 LAB — CBC
HCT: 29.2 % — ABNORMAL LOW (ref 36.0–46.0)
Hemoglobin: 9.8 g/dL — ABNORMAL LOW (ref 12.0–15.0)
MCH: 33.2 pg (ref 26.0–34.0)
MCHC: 33.6 g/dL (ref 30.0–36.0)
MCV: 99 fL (ref 78.0–100.0)
PLATELETS: 88 10*3/uL — AB (ref 150–400)
RBC: 2.95 MIL/uL — ABNORMAL LOW (ref 3.87–5.11)
RDW: 15.7 % — AB (ref 11.5–15.5)
WBC: 9.7 10*3/uL (ref 4.0–10.5)

## 2013-10-29 LAB — TSH: TSH: 1.26 u[IU]/mL (ref 0.350–4.500)

## 2013-10-29 LAB — BASIC METABOLIC PANEL WITH GFR
BUN: 21 mg/dL (ref 6–23)
CO2: 27 meq/L (ref 19–32)
Calcium: 8.3 mg/dL — ABNORMAL LOW (ref 8.4–10.5)
Chloride: 111 meq/L (ref 96–112)
Creatinine, Ser: 0.54 mg/dL (ref 0.50–1.10)
GFR calc Af Amer: >90 mL/min
GFR calc non Af Amer: >90 mL/min
Glucose, Bld: 77 mg/dL (ref 70–99)
Potassium: 4.3 meq/L (ref 3.7–5.3)
Sodium: 145 meq/L (ref 137–147)

## 2013-10-29 LAB — TROPONIN I
Troponin I: 3.56 ng/mL
Troponin I: 9.33 ng/mL

## 2013-10-29 LAB — HEPARIN LEVEL (UNFRACTIONATED)
Heparin Unfractionated: 0.71 [IU]/mL — ABNORMAL HIGH (ref 0.30–0.70)
Heparin Unfractionated: 0.85 [IU]/mL — ABNORMAL HIGH (ref 0.30–0.70)

## 2013-10-29 LAB — URINALYSIS, ROUTINE W REFLEX MICROSCOPIC
Bilirubin Urine: NEGATIVE
Glucose, UA: NEGATIVE mg/dL
Hgb urine dipstick: NEGATIVE
Ketones, ur: NEGATIVE mg/dL
Leukocytes, UA: NEGATIVE
Nitrite: NEGATIVE
Protein, ur: NEGATIVE mg/dL
Specific Gravity, Urine: 1.018 (ref 1.005–1.030)
Urobilinogen, UA: 1 mg/dL (ref 0.0–1.0)
pH: 5.5 (ref 5.0–8.0)

## 2013-10-29 MED ORDER — ASPIRIN EC 81 MG PO TBEC: 81.0000 mg | DELAYED_RELEASE_TABLET | Freq: Every day | ORAL | Status: DC

## 2013-10-29 MED ORDER — CARVEDILOL 3.125 MG PO TABS
3.1250 mg | ORAL_TABLET | Freq: Two times a day (BID) | ORAL | Status: DC
  Administered 2013-10-29 – 2013-10-30 (×2): 3.125 mg via ORAL
  Filled 2013-10-29 (×4): qty 1

## 2013-10-29 NOTE — Plan of Care (Signed)
Presented with new AF/RVR and FTT for several days- felt to be dehydrated. Also TNI mildly elevated. Since admission was started on Cardizem IV. Later developed hypotension as low as 69/52. Converted to NSR ~3 am. TNI now 9.33 so will need cardiology eval. EKG from 3 am with NS ST changes. ECHO pending, Hopefully just demand ischemia. OVS ordered but pt unable to stand per RN notes. Since BP soft will dc Cardizem gtt and continue IVFs at 125/hr- will hold on rate control agents since RVR may have been either ischemic or volume depletion mediated- if AF/RVR recurs may need IV Amiodarone.  Erin Hearing, ANP

## 2013-10-29 NOTE — Progress Notes (Signed)
Pt has converted back into NSR and vital signs are stable. Tylene Fantasia, NP notified via text page.

## 2013-10-29 NOTE — Progress Notes (Signed)
Pt remains hypotensive, unable to start Cardizem gtt. Notified Tylene Fantasia, NP. New orders given.

## 2013-10-29 NOTE — Progress Notes (Signed)
Unable to obtain orthostatic vital signs because pt is not able to tolerate standing position.

## 2013-10-29 NOTE — Progress Notes (Signed)
[facility] Progress Note    Dawn [name] IRS:[id number] DOB: Feb 01, 1946 DOA: 10/28/2013 PCP: [name]  Brief narrative: 68 -year-old female with developmental disability and seizure disorder. Brought to the emergency department from her group home because of changes in behavior which were not typical for her. Patient is normally nonverbal and visually impaired so has very limited communication. Initially it was felt the patient may have had seizure activity but she displayed no typical postictal phase. Upon arrival to the emergency department she was found to be in atrial fibrillation with RVR which would be a new problem for her. The staff did note that the patient had not been drinking enough fluids over the past one to 2 days. She not go had any other constitutional symptoms or any signs of GI bleeding. In addition to the atrial fibrillation with RVR the patient has a subtle elevation in troponin.  Assessment/Plan: Active Problems:   Atrial fibrillation with rapid ventricular response -Converted to NSR @ 3 am - Have not ordered a rate controlling agent 2/2 hypotension -Appreciate cardiology assistance -Consider beta blocker once blood pressure stabilized -Agree not good candidate for anticoagulation with history of seizures and current thrombocytopenia noting [place] Vasc score only 2- d/c heparin     Hypotension -Multifactorial: Remission volume depletion, IV Cardizem infusion, RVR -BP still soft so continue IV fluids    Dehydration -Unable to obtain OVS to patient inability to cooperate -Continue fluids    Hypothyroidism -Continue Synthroid -TSH 1.26     Elevated troponin -Has peaked to 9.33-rpt in a.m. -Cardiology also monitoring -Echocardiogram this admission with preserved LV function and no regional wall motion abnormalities -Suspect demand ischemia from hypotension and RVR -Not appropriate candidate for cath or stress testing and recommend aspirin and  beta blocker only    New finding grade 2 diastolic dysfunction -Appears to be compensated without any respiratory symptoms    Thrombocytopenia -No signs of gram-negative infection and a baseline unknown -Suspect may be related to antiepileptic medications -Follow labs -Since maintaining sinus rhythm and not having any true ischemic symptoms such as EKG changes or regional wall motion abnormalities on echo, will discontinue IV heparin infusion    Anemia -Likely chronic disease nature -Check anemia panel    Seizure disorder -Continue home medications    MR (mental retardation)    HLD (hyperlipidemia)   DVT prophylaxis: Was on IV heparin infusion but likely can stop in favor of daily Lovenox versus SCDs since thrombocytopenia Code Status: Full Family Communication: No family at bedside Disposition Plan/Expected LOS: Remain in step down   Consultants: Cardiology  Procedures: 2-D echocardiogram - Procedure narrative: Transthoracic echocardiography. Image quality was poor. The study was technically difficult, as a result of poor acoustic windows, poor patient compliance, and restricted patient mobility. - Left ventricle: The cavity size was normal. There was mild focal basal hypertrophy of the septum. Systolic function was normal. The estimated ejection fraction was in the range of 60% to 65%. Wall motion was normal; there were no regional wall motion abnormalities. Features are consistent with a pseudonormal left ventricular filling pattern, with concomitant abnormal relaxation and increased filling pressure (grade 2 diastolic dysfunction).   Antibiotics: None  HPI/Subjective: Patient nonverbal. No apparent complaints. Slightly restless.  Objective: Blood pressure 90/58, pulse 84, temperature 97.4 F (36.3 C), temperature source Oral, resp. rate 18, height 5\' 4"  (1.626 m), weight 129 lb 13.6 oz (58.9 kg), SpO2 97.00%.  Intake/Output Summary (Last 24 hours) at  10/29/13 1429 Last  data filed at 10/29/13 1000  Gross per 24 hour  Intake 2804.85 ml  Output    150 ml  Net 2654.85 ml     Exam: General: No acute respiratory distress Lungs: Clear to auscultation bilaterally without wheezes or crackles, RA Cardiovascular: Regular rate and rhythm without murmur gallop or rub normal S1 and S2, no peripheral edema or JVD Abdomen: Nontender, nondistended, soft, bowel sounds positive, no rebound, no ascites, no appreciable mass Musculoskeletal: No significant cyanosis, clubbing of bilateral lower extremities Neurological: Awake but due to nonverbal state chronically unable to ascertain orientation, has spontaneous movement of all 4 extremities, prefers keeping legs flexed the chest but no evidence of contractures  Scheduled Meds:  Scheduled Meds: . aspirin EC  81 mg Oral Daily  . atorvastatin  10 mg Oral Daily  . calcium-vitamin D  1 tablet Oral Daily  . carvedilol  3.125 mg Oral BID WC  . divalproex  750 mg Oral TID  . levETIRAcetam  750 mg Oral TID  . levothyroxine  75 mcg Oral Daily  . pantoprazole  40 mg Oral Q1200  . vitamin C  500 mg Oral TID   Continuous Infusions: . sodium chloride 125 mL/hr at 10/29/13 0533  . heparin 600 Units/hr (10/29/13 1313)    **Reviewed in detail by the Attending Physician  Data Reviewed: Basic Metabolic Panel:  Recent Labs Lab 10/28/13 1547 10/28/13 2136 10/29/13 0406  NA 148*  --  145  K 4.0  --  4.3  CL 107  --  111  CO2 29  --  27  GLUCOSE 109*  --  77  BUN 26*  --  21  CREATININE 0.60  --  0.54  CALCIUM 9.2  --  8.3*  MG  --  1.8  --    Liver Function Tests:  Recent Labs Lab 10/28/13 1547  AST 42*  ALT 36*  ALKPHOS 133*  BILITOT <0.2*  PROT 6.7  ALBUMIN 2.5*   No results found for this basename: LIPASE, AMYLASE,  in the last 168 hours No results found for this basename: AMMONIA,  in the last 168 hours CBC:  Recent Labs Lab 10/28/13 1547 10/29/13 0406  WBC 7.6 9.7  NEUTROABS  4.8  --   HGB 10.7* 9.8*  HCT 32.2* 29.2*  MCV 100.9* 99.0  PLT 95* 88*   Cardiac Enzymes:  Recent Labs Lab 10/28/13 1547 10/28/13 1930 10/29/13 0105 10/29/13 0700  TROPONINI 0.31* 1.94* 3.56* 9.33*   BNP (last 3 results)  Recent Labs  10/28/13 1547  PROBNP 488.2*   CBG:  Recent Labs Lab 10/28/13 1531 10/28/13 2031 10/28/13 2258  GLUCAP 104* 67* 99    Recent Results (from the past 240 hour(s))  MRSA PCR SCREENING     Status: None   Collection Time    10/28/13  7:58 PM      Result Value Range Status   MRSA by PCR NEGATIVE  NEGATIVE Final   Comment:            The GeneXpert MRSA Assay (FDA     approved for NASAL specimens     only), is one component of a     comprehensive MRSA colonization     surveillance program. It is not     intended to diagnose MRSA     infection nor to guide or     monitor treatment for     MRSA infections.     Studies:  Recent x-ray studies have been  reviewed in detail by the Attending Physician  Time spent :     Erin Hearing, ANP [facility] Office  [phone] Pager [phone]  **If unable to reach the above provider after paging please contact the [place] @ [phone]  On-Call/Text Page:      [email or web address]      password [email or web address]  If 7PM-7AM, please contact night-coverage [email or web address] Password [facility] 10/29/2013, 2:29 PM   LOS: 1 day   I have examined the patient, reviewed the chart and modified the above note which I agree with.   [id number] 10/29/2013, 5:07 PM

## 2013-10-29 NOTE — Progress Notes (Signed)
Echo Lab  2D Echocardiogram completed.  [place], RDCS 10/29/2013 8:53 AM

## 2013-10-29 NOTE — Progress Notes (Addendum)
Clinical Social Work Department BRIEF PSYCHOSOCIAL ASSESSMENT 10/29/2013  Patient:  Dawn [name], Dawn [name]     Account Number:  [id number]     Admit date:  10/28/2013  Clinical Social Worker:  Freeman Caldron  Date/Time:  10/29/2013 04:17 PM  Referred by:  Physician  Date Referred:  10/29/2013 Referred for  Other - See comment   Other Referral:   From a group home   Interview type:  Other - See comment Other interview type:   Facility    PSYCHOSOCIAL DATA Living Status:  FACILITY Admitted from facility:  [place] Level of care:  Group Home Primary support name:  [name] ([phone]) and Amanda Cockayne, RN [phone]) Primary support relationship to patient:  SIBLING and RN at group home Degree of support available:   Good--pt lives in a group home in [place].    CURRENT CONCERNS Current Concerns  Post-Acute Placement   Other Concerns:    SOCIAL WORK ASSESSMENT / PLAN CSW left a voicemail for pt's sister requesting a call back to confirm pt is from [facility] group home. CSW found contact information for group home on the internet and spoke with administrator at group home. CSW explained role in discharge and asked administrator what paperwork they need when pt is ready for discharge. Administration states they need the discharge summary and if the pt is here for an extended period of time, 30 days or more, the facility will need SNF documents. Administrator explained that they also need to change information on documents they receive from the hospital. CSW provided the phone number for the hospital and recommended administrator ask for medical records to explain error in documentation. CSW waiting on return call from pt's sister.   Assessment/plan status:  Psychosocial Support/Ongoing Assessment of Needs Other assessment/ plan:   Information/referral to community resources:   Group Home ([facility]).    PATIENT'S/FAMILY'S RESPONSE TO PLAN OF  CARE: Good--administrator understanding of CSW role and will contact CSW if they need anything when pt is ready for discharge in addition to the discharge summary. CSW waiting for return call from pt's sister.       Ky Barban, MSW, [facility] Clinical Social Worker [phone]

## 2013-10-29 NOTE — Care Management Note (Addendum)
    Page 1 of 1   11/02/2013     4:20:51 PM   CARE MANAGEMENT NOTE 11/02/2013  Patient:  Dawn [name], Dawn [name]   Account Number:  [id number]  Date Initiated:  10/29/2013  Documentation initiated by:  Elissa Hefty  Subjective/Objective Assessment:   adm w at fib     Action/Plan:   from [place] Date:  11/02/2013   Anticipated DC Plan:  [place] referral  Clinical Social Worker      DC Planning Services  CM consult      Choice offered to / List presented to:             Status of service:  Completed, signed off Medicare Important Message given?   (If response is "NO", the following Medicare IM given date fields will be blank) Date Medicare IM given:   Date Additional Medicare IM given:    Discharge Disposition:  GROUP HOME  Per UR Regulation:  Reviewed for med. necessity/level of care/duration of stay  If discussed at [place] of Stay Meetings, dates discussed:    Comments:  11/02/13 Dawn Rapozo,RN,BSN [phone] PT DISCHARGED TO [place], PER CSW ARRANGEMENTS.

## 2013-10-29 NOTE — Progress Notes (Signed)
[place] for heparin Indication: atrial fibrillation  Allergies  Allergen Reactions  . Ppd [Tuberculin Purified Protein Derivative]     Per [facility]    Patient Measurements: Heparin Dosing Weight: 52.6 kg  Vital Signs: Temp: 97.4 F (36.3 C) (01/15 1134) Temp src: Oral (01/15 1134) BP: 90/58 mmHg (01/15 1134) Pulse Rate: 84 (01/15 1134)  Labs:  Recent Labs  10/28/13 1547 10/28/13 1930 10/29/13 0105 10/29/13 0406 10/29/13 0700 10/29/13 1209  HGB 10.7*  --   --  9.8*  --   --   HCT 32.2*  --   --  29.2*  --   --   PLT 95*  --   --  88*  --   --   APTT 32  --   --   --   --   --   LABPROT 13.5  --   --   --   --   --   INR 1.05  --   --   --   --   --   HEPARINUNFRC  --   --   --  0.71*  --  0.85*  CREATININE 0.60  --   --  0.54  --   --   TROPONINI 0.31* 1.94* 3.56*  --  9.33*  --    Assessment: 68 yo female with Afib for heparin. HL trended up to 0.85 (supratherapeutic) despite decrease in infusion rate. Plt trended down this AM to 88. H/H 9.8/29.2. Heparin line seems to be infusing well. No bleeding noted.   Goal of Therapy:  Heparin level 0.3-0.7 units/ml Monitor platelets by anticoagulation protocol: Yes   Plan:  1) Decrease heparin infusion rate to 600 units/hr  2) F/u 6 hour HL  3) Monitor daily CBC, HL, and s/s of bleeding   Albertina Parr, PharmD.  Clinical Pharmacist Pager [phone]

## 2013-10-29 NOTE — Progress Notes (Signed)
[place] for heparin Indication: atrial fibrillation  Allergies  Allergen Reactions  . Ppd [Tuberculin Purified Protein Derivative]     Per [facility]    Patient Measurements: Heparin Dosing Weight: 52.6 kg  Vital Signs: Temp: 98.6 F (37 C) (01/15 0338) Temp src: Oral (01/15 0338) BP: 96/54 mmHg (01/15 0200) Pulse Rate: 100 (01/15 0200)  Labs:  Recent Labs  10/28/13 1547 10/28/13 1930 10/29/13 0105 10/29/13 0406  HGB 10.7*  --   --  9.8*  HCT 32.2*  --   --  29.2*  PLT 95*  --   --  88*  APTT 32  --   --   --   LABPROT 13.5  --   --   --   INR 1.05  --   --   --   HEPARINUNFRC  --   --   --  0.71*  CREATININE 0.60  --   --   --   TROPONINI 0.31* 1.94* 3.56*  --    Assessment: 68 yo female with Afib for heparin Goal of Therapy:  INR 2-3 Heparin level 0.3-0.7 units/ml Monitor platelets by anticoagulation protocol: Yes   Plan:  Decrease heparin 700 units/hr  Phillis Knack, PharmD, BCPS   10/29/2013 5:02 AM

## 2013-10-29 NOTE — Consult Note (Signed)
CARDIOLOGY CONSULT NOTE       Patient ID: Dawn [name] MRN: [id number] DOB/AGE: 68-09-1946 68 y.o.  Admit date: 10/28/2013 Referring Physician:  Wynelle Cleveland Primary Physician: Clint Guy, MD Primary Cardiologist:  New Reason for Consultation: Afib elevated troponin  Principal Problem:   Atrial fibrillation with rapid ventricular response Active Problems:   Seizure disorder   MR (mental retardation)   Hypothyroidism   HLD (hyperlipidemia)   Elevated troponin   Atrial fibrillation with RVR   HPI:   68 yo with mental retardation lives in group home History of seizures  Taken to hospital for change in [place].  She is non verbal.  No seizure activity seen.  EEG in 2013 showed structural foci for seizure activity  Valproic Acid level normal on admission  Noted to be in afib in ER  Hemodynamics stable and now back in NSR.  Troponin minimally elevated with no acute ECG changes and as far as we can tell she is not in distress.  TTE done shows normal EF 60-65% with no RWMA;s  CRF;s elevated lipids  No history of CAD, stroke syncope.  No bowel or bladder incontinence   ROS All other systems reviewed and negative except as noted above  Past Medical History  Diagnosis Date  . Seizures   . Osteoporosis   . Thyroid disease     hypothyroid  . High cholesterol   . Cataracts, bilateral   . Optic atrophy   . Paraparesis     mild  . Hypothyroidism   . Anginal pain     History reviewed. No pertinent family history.  History   Social History  . Marital Status: Single    Spouse Name: N/A    Number of Children: N/A  . Years of Education: N/A   Occupational History  . Not on file.   Social History Main Topics  . Smoking status: Never Smoker   . Smokeless tobacco: Never Used  . Alcohol Use: No  . Drug Use: Not on file  . Sexual Activity: No   Other Topics Concern  . Not on file   Social History Narrative  . No narrative on file    Past Surgical History  Procedure  Laterality Date  . Midline incision       . aspirin EC  81 mg Oral Daily  . atorvastatin  10 mg Oral Daily  . calcium-vitamin D  1 tablet Oral Daily  . divalproex  750 mg Oral TID  . levETIRAcetam  750 mg Oral TID  . levothyroxine  75 mcg Oral Daily  . pantoprazole  40 mg Oral Q1200  . vitamin C  500 mg Oral TID   . sodium chloride 125 mL/hr at 10/29/13 0533  . heparin 700 Units/hr (10/29/13 0512)    Physical Exam: Blood pressure 92/59, pulse 88, temperature 97.2 F (36.2 C), temperature source Oral, resp. rate 18, height 5\' 4"  (1.626 m), weight 129 lb 13.6 oz (58.9 kg), SpO2 96.00%.   Non verbal  Chronically ill black female  HEENT: normal Neck supple with no adenopathy JVP normal no bruits no thyromegaly Lungs clear with no wheezing and good diaphragmatic motion Heart:  S1/S2 no murmur, no rub, gallop or click PMI normal Abdomen: benighn, BS positve, no tenderness, no AAA mid line scar  no bruit.  No HSM or HJR Distal pulses intact with no bruits No edema Skin warm and dry [place] back and forth Tracks with eyes    Labs:   Lab  Results  Component Value Date   WBC 9.7 10/29/2013   HGB 9.8* 10/29/2013   HCT 29.2* 10/29/2013   MCV 99.0 10/29/2013   PLT 88* 10/29/2013    Recent Labs Lab 10/28/13 1547 10/29/13 0406  NA 148* 145  K 4.0 4.3  CL 107 111  CO2 29 27  BUN 26* 21  CREATININE 0.60 0.54  CALCIUM 9.2 8.3*  PROT 6.7  --   BILITOT <0.2*  --   ALKPHOS 133*  --   ALT 36*  --   AST 42*  --   GLUCOSE 109* 77   Lab Results  Component Value Date   TROPONINI 9.33* 10/29/2013       Radiology: Dg Chest Port 1 View  10/28/2013   CLINICAL DATA:  Seizure with pain  EXAM: PORTABLE CHEST - 1 VIEW  COMPARISON:  Chest CT December 29, 2010 and chest radiograph January 14, 2008  FINDINGS: The degree of inspiration is shallow. There is no edema or consolidation. Heart size and pulmonary vascularity are normal. No pneumothorax. No adenopathy. No bone lesions.  IMPRESSION: No  edema or consolidation.  Note shallow degree of inspiration.   Electronically Signed   By: Lowella Grip M.D.   On: 10/28/2013 15:47    EKG:  Afib rate 118 LVH with strain no ST elevation  FU SR rate 85 normal minimal nonspecific ST changes    ASSESSMENT AND PLAN:  Elevated Troponin:  .33 then 9.33 this am  However patient non verbal and hemodynamically stable.  ECG with no acute changes and Echo with normal EF and no RWMAls  Given her overall functional  Status would not pursue cath or stress testing ASA and beta blocker only PAF:  Maint NSR not a good candidate for anticoagulation with seizures  ASA and beta blocker Seizures:  Neuro to see?/  Valproic Acid level ok on admission  Thyroid:  TSH normal 1.2  Continue replacement  Anemia/Thrombocytopenia :  Plan per primary service low PLT;s would also make anticoagulation riskier and CHADVASC score only 2 for age and female sex  Signed: Jenkins Rouge 10/29/2013, 10:23 AM

## 2013-10-30 DIAGNOSIS — I5030 Unspecified diastolic (congestive) heart failure: Secondary | ICD-10-CM

## 2013-10-30 LAB — CBC
HEMATOCRIT: 27.1 % — AB (ref 36.0–46.0)
HEMOGLOBIN: 9.1 g/dL — AB (ref 12.0–15.0)
MCH: 33.1 pg (ref 26.0–34.0)
MCHC: 33.6 g/dL (ref 30.0–36.0)
MCV: 98.5 fL (ref 78.0–100.0)
Platelets: 75 10*3/uL — ABNORMAL LOW (ref 150–400)
RBC: 2.75 MIL/uL — ABNORMAL LOW (ref 3.87–5.11)
RDW: 16.1 % — ABNORMAL HIGH (ref 11.5–15.5)
WBC: 9.9 10*3/uL (ref 4.0–10.5)

## 2013-10-30 LAB — IRON AND TIBC
Iron: 42 ug/dL (ref 42–135)
SATURATION RATIOS: 19 % — AB (ref 20–55)
TIBC: 222 ug/dL — AB (ref 250–470)
UIBC: 180 ug/dL (ref 125–400)

## 2013-10-30 LAB — RETICULOCYTES
RBC.: 2.75 MIL/uL — AB (ref 3.87–5.11)
Retic Count, Absolute: 68.8 10*3/uL (ref 19.0–186.0)
Retic Ct Pct: 2.5 % (ref 0.4–3.1)

## 2013-10-30 LAB — TROPONIN I: Troponin I: 4.07 ng/mL (ref <0.30)

## 2013-10-30 LAB — VITAMIN B12: Vitamin B-12: 708 pg/mL (ref 211–911)

## 2013-10-30 LAB — FERRITIN: Ferritin: 367 ng/mL — ABNORMAL HIGH (ref 10–291)

## 2013-10-30 LAB — FOLATE: FOLATE: 6.8 ng/mL

## 2013-10-30 MED ORDER — ENSURE PUDDING PO PUDG
1.0000 | Freq: Three times a day (TID) | ORAL | Status: DC
  Administered 2013-10-30 – 2013-11-02 (×7): 1 via ORAL

## 2013-10-30 MED ORDER — CARVEDILOL 3.125 MG PO TABS
3.1250 mg | ORAL_TABLET | Freq: Two times a day (BID) | ORAL | Status: DC
  Administered 2013-10-31 – 2013-11-01 (×4): 3.125 mg via ORAL
  Filled 2013-10-30 (×9): qty 1

## 2013-10-30 MED ORDER — CARVEDILOL 6.25 MG PO TABS
6.2500 mg | ORAL_TABLET | Freq: Two times a day (BID) | ORAL | Status: DC
  Filled 2013-10-30 (×2): qty 1

## 2013-10-30 MED ORDER — HEPARIN SODIUM (PORCINE) 5000 UNIT/ML IJ SOLN
5000.0000 [IU] | Freq: Three times a day (TID) | INTRAMUSCULAR | Status: DC
  Administered 2013-10-30 – 2013-10-31 (×2): 5000 [IU] via SUBCUTANEOUS
  Filled 2013-10-30 (×5): qty 1

## 2013-10-30 NOTE — Progress Notes (Signed)
Received pt to 2W-34. Pt's drowsy hard to arouse. SBP in the 80's. Dr. Wynelle Cleveland notified and aware of pt condition. No new order. Continue to monitor.

## 2013-10-30 NOTE — Progress Notes (Signed)
Patient ID: Dawn [name], female   DOB: 1946/03/15, 68 y.o.   MRN: [id number]    Subjective:  Nonverbal   Objective:  Filed Vitals:   10/30/13 0000 10/30/13 0400 10/30/13 0510 10/30/13 0800  BP: 82/46 99/50  84/55  Pulse: 88 86  103  Temp:   98.3 F (36.8 C) 98.7 F (37.1 C)  TempSrc:   Oral Oral  Resp: 15 16    Height:      Weight:   131 lb 9.8 oz (59.7 kg)   SpO2: 99% 95%  95%    Intake/Output from previous day:  Intake/Output Summary (Last 24 hours) at 10/30/13 9528 Last data filed at 10/30/13 0600  Gross per 24 hour  Intake 3290.02 ml  Output      0 ml  Net 3290.02 ml    Physical Exam: Non verbal  Chronically ill black female  HEENT: normal Neck supple with no adenopathy JVP normal no bruits no thyromegaly Lungs clear with no wheezing and good diaphragmatic motion Heart:  S1/S2 no murmur, no rub, gallop or click PMI normal Abdomen: benighn, BS positve, no tenderness, no AAA no bruit.  No HSM or HJR Distal pulses intact with no bruits Plus one bilateral  edema Neuro non-focal Skin warm and dry No muscular weakness   Lab Results: Basic Metabolic Panel:  Recent Labs  10/28/13 1547 10/28/13 2136 10/29/13 0406  NA 148*  --  145  K 4.0  --  4.3  CL 107  --  111  CO2 29  --  27  GLUCOSE 109*  --  77  BUN 26*  --  21  CREATININE 0.60  --  0.54  CALCIUM 9.2  --  8.3*  MG  --  1.8  --    Liver Function Tests:  Recent Labs  10/28/13 1547  AST 42*  ALT 36*  ALKPHOS 133*  BILITOT <0.2*  PROT 6.7  ALBUMIN 2.5*   CBC:  Recent Labs  10/28/13 1547 10/29/13 0406 10/30/13 0300  WBC 7.6 9.7 9.9  NEUTROABS 4.8  --   --   HGB 10.7* 9.8* 9.1*  HCT 32.2* 29.2* 27.1*  MCV 100.9* 99.0 98.5  PLT 95* 88* 75*   Cardiac Enzymes:  Recent Labs  10/29/13 0105 10/29/13 0700 10/30/13 0300  TROPONINI 3.56* 9.33* 4.07*   BNP: Thyroid Function Tests:  Recent Labs  10/28/13 2136  TSH 1.260   Anemia Panel:  Recent Labs  10/30/13 0300    RETICCTPCT 2.5    Imaging: Dg Chest Port 1 View  10/28/2013   CLINICAL DATA:  Seizure with pain  EXAM: PORTABLE CHEST - 1 VIEW  COMPARISON:  Chest CT December 29, 2010 and chest radiograph January 14, 2008  FINDINGS: The degree of inspiration is shallow. There is no edema or consolidation. Heart size and pulmonary vascularity are normal. No pneumothorax. No adenopathy. No bone lesions.  IMPRESSION: No edema or consolidation.  Note shallow degree of inspiration.   Electronically Signed   By: Lowella Grip M.D.   On: 10/28/2013 15:47    Cardiac Studies:  ECG:  afib 118 nonspecific ST/T wave changes    Telemetry:  SR rates 90-100  Echo:  Study Conclusions  - Procedure narrative: Transthoracic echocardiography. Image quality was poor. The study was technically difficult, as a result of poor acoustic windows, poor patient compliance, and restricted patient mobility. - Left ventricle: The cavity size was normal. There was mild focal basal hypertrophy of the septum. Systolic  function was normal. The estimated ejection fraction was in the range of 60% to 65%. Wall motion was normal; there were no regional wall motion abnormalities. Features are consistent with a pseudonormal left ventricular filling pattern, with concomitant abnormal relaxation and increased filling pressure (grade 2 diastolic  Medications:   . aspirin EC  81 mg Oral Daily  . atorvastatin  10 mg Oral Daily  . calcium-vitamin D  1 tablet Oral Daily  . carvedilol  3.125 mg Oral BID WC  . divalproex  750 mg Oral TID  . levETIRAcetam  750 mg Oral TID  . levothyroxine  75 mcg Oral Daily  . pantoprazole  40 mg Oral Q1200  . vitamin C  500 mg Oral TID     . sodium chloride 125 mL/hr at 10/30/13 0100    Assessment/Plan:  PAF:  Continue low dose beta blocker increase to 6.25 bid No anticoagulation  Troponin:  Echo with no RWMA  No cath or stress testing  Thyroid:  Continue replacement Anemia:  ? Further w/u  Hct down  to 27.1 now   Will sign off   Jenkins Rouge 10/30/2013, 9:26 AM

## 2013-10-30 NOTE — Progress Notes (Signed)
Report called to [facility] floor nurse

## 2013-10-30 NOTE — Progress Notes (Signed)
INITIAL NUTRITION ASSESSMENT  DOCUMENTATION CODES Per approved criteria  -Not Applicable   INTERVENTION: 1.  Supplements; Ensure Pudding po TID, each supplement provides 170 kcal and 4 grams of protein with medications 2.  General healthful diet; encourage intake of foods and beverages as able.  RD to follow and assess for nutritional adequacy.    NUTRITION DIAGNOSIS: Inadequate oral intake related to AMS as evidenced by PO 25% of meals.   Monitor:  1.  Food/Beverage; pt meeting >/=90% estimated needs with tolerance. 2.  Wt/wt change; monitor trends  Reason for Assessment: Low Braden  68 y.o. 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  Pt has resumed home diet of Dysphagia 1, thin liquids.  Pt with declining intake PTA, and has continued with suboptimal intake since admission.   Nutrition Focused Physical Exam: Subcutaneous Fat:  Orbital Region: WNL Upper Arm Region: WNL Thoracic and Lumbar Region: not assesed  Muscle:  Temple Region: WNL Clavicle Bone Region: not assessed Clavicle and Acromion Bone Region: WNL Scapular Bone Region: WNL Dorsal Hand: not assessed Patellar Region: not assessed Anterior Thigh Region: not assessed Posterior Calf Region: not assessed  Edema: none present  RD to order supplements to be given with medication or as needed. Will monitor for improvement in intake.   RD to follow.   Height: Ht Readings from Last 1 Encounters:  10/28/13 5\' 4"  (1.626 m)    Weight: Wt Readings from Last 1 Encounters:  10/30/13 131 lb 9.8 oz (59.7 kg)    Ideal Body Weight: 120 lbs  % Ideal Body Weight: 109%  Wt Readings from Last 10 Encounters:  10/30/13 131 lb 9.8 oz (59.7 kg)  02/12/12 116 lb (52.617 kg)    Usual Body Weight: unknown  % Usual Body Weight: unable to assess  BMI:  Body mass index is 22.58 kg/(m^2).  Estimated Nutritional Needs: Kcal:  3810-1751 Protein: 60-70g Fluid: ~1.8 L/day  Skin: intact  Diet Order: Dysphagia 1, thin  EDUCATION NEEDS: -Education needs addressed   Intake/Output Summary (Last 24 hours) at 10/30/13 1453 Last data filed at 10/30/13 0600  Gross per 24 hour  Intake 3050.02 ml  Output      0 ml  Net 3050.02 ml    Last BM: PTA  Labs:   Recent Labs Lab 10/28/13 1547 10/28/13 2136 10/29/13 0406  NA 148*  --  145  K 4.0  --  4.3  CL 107  --  111  CO2 29  --  27  BUN 26*  --  21  CREATININE 0.60  --  0.54  CALCIUM 9.2  --  8.3*  MG  --  1.8  --   GLUCOSE 109*  --  77    CBG (last 3)   Recent Labs  10/28/13 1531 10/28/13 2031 10/28/13 2258  GLUCAP 104* 67* 99    Scheduled Meds: . aspirin EC  81 mg Oral Daily  . atorvastatin  10 mg Oral Daily  . calcium-vitamin D  1 tablet Oral Daily  . carvedilol  3.125 mg Oral BID WC  . divalproex  750 mg Oral TID  . levETIRAcetam  750 mg Oral TID  . levothyroxine  75 mcg Oral Daily  . pantoprazole  40 mg Oral Q1200  . vitamin C  500 mg Oral TID    Continuous Infusions: . sodium chloride 125 mL/hr (10/30/13 1027)    Past Medical History  Diagnosis Date  .  Seizures   . Osteoporosis   . Thyroid disease     hypothyroid  . High cholesterol   . Cataracts, bilateral   . Optic atrophy   . Paraparesis     mild  . Hypothyroidism   . Anginal pain     Past Surgical History  Procedure Laterality Date  . Midline incision      Brynda Greathouse, MS RD LDN Clinical Inpatient Dietitian Pager: [phone] Weekend/After hours pager: [phone]

## 2013-10-30 NOTE — Progress Notes (Signed)
Administrator/RN from pt's group home called CSW asked if pt is discharging today. CSW explained that there is not a discharge note in the chart yet, and asked RN to call pt's RN here at the hospital for a clinical update on pt's progress. CSW provided the contact number for nursing station and explained that she can ask for the RN for room 20. Administrator thanked CSW and will do this.   Ky Barban, MSW, [facility] Clinical Social Worker [phone]

## 2013-10-30 NOTE — Progress Notes (Signed)
[facility] Progress Note    Dawn [name] KGU:[id number] DOB: 1946/04/03 DOA: 10/28/2013 PCP: [name]  Brief narrative: 68 -year-old female with developmental disability and seizure disorder. Brought to the emergency department from her group home because of changes in behavior which were not typical for her. Patient is normally nonverbal and visually impaired so has very limited communication. Initially it was felt the patient may have had seizure activity but she displayed no typical postictal phase. Upon arrival to the emergency department she was found to be in atrial fibrillation with RVR which would be a new problem for her. The staff did note that the patient had not been drinking enough fluids over the past one to 2 days. She not go had any other constitutional symptoms or any signs of GI bleeding. In addition to the atrial fibrillation with RVR the patient has a subtle elevation in troponin.  Assessment/Plan: Active Problems:   Atrial fibrillation with rapid ventricular response -Converted to NSR @ 3 am 1/15 -Appreciate cardiology assistance -started low dose Coreg 1/15- BP still soft so will defer up titration for now -Agree not good candidate for anticoagulation with history of seizures and current thrombocytopenia noting [place] Vasc score only 2- d/c'd heparin     Hypotension -Multifactorial: pre admission volume depletion, IV Cardizem infusion, RVR -BP still soft so continue IV fluids    Dehydration -Unable to obtain OVS to patient inability to cooperate -Continue fluids noting calculated water deficit is 2.3 L    Hypothyroidism -Continue Synthroid -TSH 1.26     Elevated troponin -Has peaked to 9.33-rpt today down to 4 -Cardiology also monitoring -Echocardiogram this admission with preserved LV function and no regional wall motion abnormalities -Suspect demand ischemia from hypotension and RVR -Not appropriate candidate for cath or stress testing -  recommend aspirin and beta blocker only    New finding grade 2 diastolic dysfunction -Appears to be compensated without any respiratory symptoms    Thrombocytopenia -No signs of gram-negative infection and a baseline unknown -Suspect may be related to antiepileptic medications -Follow labs -Since maintaining sinus rhythm and not having any true ischemic symptoms such as EKG changes or regional wall motion abnormalities on echo, will discontinue IV heparin infusion    Anemia -Likely chronic disease nature -Anemia panel pending    Seizure disorder -Continue home medications    MR (mental retardation)    HLD (hyperlipidemia)   DVT prophylaxis: Heparin Code Status: Full Family Communication: No family at bedside Disposition Plan/Expected LOS: Transfer to Telemetry   Consultants: Cardiology  Procedures: 2-D echocardiogram - Procedure narrative: Transthoracic echocardiography. Image quality was poor. The study was technically difficult, as a result of poor acoustic windows, poor patient compliance, and restricted patient mobility. - Left ventricle: The cavity size was normal. There was mild focal basal hypertrophy of the septum. Systolic function was normal. The estimated ejection fraction was in the range of 60% to 65%. Wall motion was normal; there were no regional wall motion abnormalities. Features are consistent with a pseudonormal left ventricular filling pattern, with concomitant abnormal relaxation and increased filling pressure (grade 2 diastolic dysfunction).   Antibiotics: None  HPI/Subjective: Patient nonverbal. No apparent complaints. Not restless today  Objective: Blood pressure 107/61, pulse 103, temperature 98.7 F (37.1 C), temperature source Oral, resp. rate 22, height 5\' 4"  (1.626 m), weight 131 lb 9.8 oz (59.7 kg), SpO2 95.00%.  Intake/Output Summary (Last 24 hours) at 10/30/13 1128 Last data filed at 10/30/13 0600  Gross per 24  hour  Intake  3050.02 ml  Output      0 ml  Net 3050.02 ml     Exam: General: No acute respiratory distress Lungs: Clear to auscultation bilaterally without wheezes or crackles, RA Cardiovascular: Regular somewhat tachycardic rate with SINUS rhythm without murmur gallop or rub normal S1 and S2, no peripheral edema or JVD Abdomen: Nontender, nondistended, soft, bowel sounds positive, no rebound, no ascites, no appreciable mass Musculoskeletal: No significant cyanosis, clubbing of bilateral lower extremities Neurological: Awake but due to nonverbal state chronically unable to ascertain orientation, has spontaneous movement of all 4 extremities, prefers keeping legs flexed the chest but no evidence of contractures  Scheduled Meds:  Scheduled Meds: . aspirin EC  81 mg Oral Daily  . atorvastatin  10 mg Oral Daily  . calcium-vitamin D  1 tablet Oral Daily  . carvedilol  3.125 mg Oral BID WC  . divalproex  750 mg Oral TID  . levETIRAcetam  750 mg Oral TID  . levothyroxine  75 mcg Oral Daily  . pantoprazole  40 mg Oral Q1200  . vitamin C  500 mg Oral TID   Continuous Infusions: . sodium chloride 125 mL/hr (10/30/13 1027)    **Reviewed in detail by the Attending Physician  Data Reviewed: Basic Metabolic Panel:  Recent Labs Lab 10/28/13 1547 10/28/13 2136 10/29/13 0406  NA 148*  --  145  K 4.0  --  4.3  CL 107  --  111  CO2 29  --  27  GLUCOSE 109*  --  77  BUN 26*  --  21  CREATININE 0.60  --  0.54  CALCIUM 9.2  --  8.3*  MG  --  1.8  --    Liver Function Tests:  Recent Labs Lab 10/28/13 1547  AST 42*  ALT 36*  ALKPHOS 133*  BILITOT <0.2*  PROT 6.7  ALBUMIN 2.5*   No results found for this basename: LIPASE, AMYLASE,  in the last 168 hours No results found for this basename: AMMONIA,  in the last 168 hours CBC:  Recent Labs Lab 10/28/13 1547 10/29/13 0406 10/30/13 0300  WBC 7.6 9.7 9.9  NEUTROABS 4.8  --   --   HGB 10.7* 9.8* 9.1*  HCT 32.2* 29.2* 27.1*  MCV 100.9*  99.0 98.5  PLT 95* 88* 75*   Cardiac Enzymes:  Recent Labs Lab 10/28/13 1547 10/28/13 1930 10/29/13 0105 10/29/13 0700 10/30/13 0300  TROPONINI 0.31* 1.94* 3.56* 9.33* 4.07*   BNP (last 3 results)  Recent Labs  10/28/13 1547  PROBNP 488.2*   CBG:  Recent Labs Lab 10/28/13 1531 10/28/13 2031 10/28/13 2258  GLUCAP 104* 67* 99    Recent Results (from the past 240 hour(s))  MRSA PCR SCREENING     Status: None   Collection Time    10/28/13  7:58 PM      Result Value Range Status   MRSA by PCR NEGATIVE  NEGATIVE Final   Comment:            The GeneXpert MRSA Assay (FDA     approved for NASAL specimens     only), is one component of a     comprehensive MRSA colonization     surveillance program. It is not     intended to diagnose MRSA     infection nor to guide or     monitor treatment for     MRSA infections.  URINE CULTURE     Status: None  Collection Time    10/29/13  5:15 AM      Result Value Range Status   Specimen Description URINE, CATHETERIZED   Final   Special Requests NONE   Final   Culture  Setup Time     Final   Value: 10/29/2013 06:19     Performed at [place]     Final   Value: 75,000 COLONIES/ML     Performed at [facility]   Culture     Final   Value: [place]     Performed at [facility]   Report Status PENDING   Incomplete     Studies:  Recent x-ray studies have been reviewed in detail by the Attending Physician  Time spent :     Erin Hearing, [place] [facility] Office  [phone] Pager [phone]  **If unable to reach the above provider after paging please contact the [place] @ [phone]  On-Call/Text Page:      [email or web address]      password [email or web address]  If 7PM-7AM, please contact night-coverage [email or web address] Password [email or web address] 10/30/2013, 11:28 AM   LOS: 2 days

## 2013-10-31 ENCOUNTER — Other Ambulatory Visit (HOSPITAL_COMMUNITY): Payer: Medicare Other

## 2013-10-31 LAB — BASIC METABOLIC PANEL
BUN: 9 mg/dL (ref 6–23)
CO2: 25 meq/L (ref 19–32)
CREATININE: 0.53 mg/dL (ref 0.50–1.10)
Calcium: 8.6 mg/dL (ref 8.4–10.5)
Chloride: 116 mEq/L — ABNORMAL HIGH (ref 96–112)
GFR calc Af Amer: >90 mL/min (ref >90)
GFR calc non Af Amer: >90 mL/min (ref >90)
Glucose, Bld: 65 mg/dL — ABNORMAL LOW (ref 70–99)
Potassium: 3.9 mEq/L (ref 3.7–5.3)
Sodium: 151 mEq/L — ABNORMAL HIGH (ref 137–147)

## 2013-10-31 LAB — GLUCOSE, CAPILLARY: Glucose-Capillary: 105 mg/dL — ABNORMAL HIGH (ref 70–99)

## 2013-10-31 LAB — CBC
HEMATOCRIT: 40.8 % (ref 36.0–46.0)
Hemoglobin: 13.6 g/dL (ref 12.0–15.0)
MCH: 32.8 pg (ref 26.0–34.0)
MCHC: 33.3 g/dL (ref 30.0–36.0)
MCV: 98.3 fL (ref 78.0–100.0)
PLATELETS: 47 10*3/uL — AB (ref 150–400)
RBC: 4.15 MIL/uL (ref 3.87–5.11)
RDW: 16.7 % — ABNORMAL HIGH (ref 11.5–15.5)
WBC: 4.9 10*3/uL (ref 4.0–10.5)

## 2013-10-31 MED ORDER — SODIUM CHLORIDE 0.45 % IV SOLN
INTRAVENOUS | Status: DC
Start: 2013-10-31 — End: 2013-11-01
  Administered 2013-10-31 (×2): via INTRAVENOUS

## 2013-10-31 NOTE — Progress Notes (Signed)
[facility] Progress Note    Dawn [name] [id number] DOB: 01/05/1946 DOA: 10/28/2013 PCP: [name]  Brief narrative: 68 -year-old female with developmental disability and seizure disorder. Brought to the emergency department from her group home because of changes in behavior which were not typical for her. Patient is normally nonverbal and visually impaired so has very limited communication. Initially it was felt the patient may have had seizure activity but she displayed no typical postictal phase. Upon arrival to the emergency department she was found to be in atrial fibrillation with RVR which would be a new problem for her. The staff did note that the patient had not been drinking enough fluids over the past one to 2 days. She not go had any other constitutional symptoms or any signs of GI bleeding. In addition to the atrial fibrillation with RVR the patient has a subtle elevation in troponin.  Assessment/Plan: Active Problems:   Atrial fibrillation with rapid ventricular response -Converted to NSR @ 3 am 1/15 -Appreciate cardiology assistance -started low dose Coreg 1/15- BP still soft so will defer up titration for now -Agree not good candidate for anticoagulation with history of seizures and current thrombocytopenia noting [place] Vasc score only 2- d/c'd heparin     Hypotension -Multifactorial: pre admission volume depletion, IV Cardizem infusion, RVR -BP still soft so continue IV fluids  Thrombocytopenia - stop heparin- follow platelets carefully   Hypoglycemia - check sugars tid ac    Dehydration -Unable to obtain OVS to patient inability to cooperate -Continue fluids noting calculated water deficit is 2.3 L    Hypothyroidism -Continue Synthroid -TSH 1.26     Elevated troponin -Has peaked to 9.33-rpt today down to 4 -Cardiology also monitoring -Echocardiogram this admission with preserved LV function and no regional wall motion  abnormalities -Suspect demand ischemia from hypotension and RVR -Not appropriate candidate for cath or stress testing - recommend aspirin and beta blocker only    New finding grade 2 diastolic dysfunction -Appears to be compensated without any respiratory symptoms    Thrombocytopenia -No signs of gram-negative infection and a baseline unknown -Suspect may be related to antiepileptic medications -Follow labs -Since maintaining sinus rhythm and not having any true ischemic symptoms such as EKG changes or regional wall motion abnormalities on echo, will discontinue IV heparin infusion    Anemia -Likely chronic disease nature -Anemia panel pending    Seizure disorder -Continue home medications    MR (mental retardation)    HLD (hyperlipidemia)   DVT prophylaxis: Heparin Code Status: Full Family Communication: No family at bedside Disposition Plan/Expected LOS: Transfer to Telemetry- back to group home   Consultants: Cardiology  Procedures: 2-D echocardiogram - Procedure narrative: Transthoracic echocardiography. Image quality was poor. The study was technically difficult, as a result of poor acoustic windows, poor patient compliance, and restricted patient mobility. - Left ventricle: The cavity size was normal. There was mild focal basal hypertrophy of the septum. Systolic function was normal. The estimated ejection fraction was in the range of 60% to 65%. Wall motion was normal; there were no regional wall motion abnormalities. Features are consistent with a pseudonormal left ventricular filling pattern, with concomitant abnormal relaxation and increased filling pressure (grade 2 diastolic dysfunction).   Antibiotics: None  HPI/Subjective: Patient nonverbal. No apparent complaints. Not restless today  Objective: Blood pressure 102/69, pulse 88, temperature 97.6 F (36.4 C), temperature source Axillary, resp. rate 19, height 5\' 4"  (1.626 m), weight 61.1 kg (134  lb 11.2 oz),  SpO2 96.00%.  Intake/Output Summary (Last 24 hours) at 10/31/13 1540 Last data filed at 10/31/13 1300  Gross per 24 hour  Intake   2295 ml  Output      0 ml  Net   2295 ml     Exam: General: No acute respiratory distress Lungs: Clear to auscultation bilaterally without wheezes or crackles, RA Cardiovascular: Regular somewhat tachycardic rate with SINUS rhythm without murmur gallop or rub normal S1 and S2, no peripheral edema or JVD Abdomen: Nontender, nondistended, soft, bowel sounds positive, no rebound, no ascites, no appreciable mass Musculoskeletal: No significant cyanosis, clubbing of bilateral lower extremities Neurological: Awake but due to nonverbal state chronically unable to ascertain orientation, has spontaneous movement of all 4 extremities, prefers keeping legs flexed the chest but no evidence of contractures  Scheduled Meds:  Scheduled Meds: . aspirin EC  81 mg Oral Daily  . atorvastatin  10 mg Oral Daily  . calcium-vitamin D  1 tablet Oral Daily  . carvedilol  3.125 mg Oral BID WC  . divalproex  750 mg Oral TID  . feeding supplement (ENSURE)  1 Container Oral TID BM  . levETIRAcetam  750 mg Oral TID  . levothyroxine  75 mcg Oral Daily  . pantoprazole  40 mg Oral Q1200  . vitamin C  500 mg Oral TID   Continuous Infusions: . sodium chloride 100 mL/hr at 10/31/13 1134    **Reviewed in detail by the Attending Physician  Data Reviewed: Basic Metabolic Panel:  Recent Labs Lab 10/28/13 1547 10/28/13 2136 10/29/13 0406 10/31/13 0315  NA 148*  --  145 151*  K 4.0  --  4.3 3.9  CL 107  --  111 116*  CO2 29  --  27 25  GLUCOSE 109*  --  77 65*  BUN 26*  --  21 9  CREATININE 0.60  --  0.54 0.53  CALCIUM 9.2  --  8.3* 8.6  MG  --  1.8  --   --    Liver Function Tests:  Recent Labs Lab 10/28/13 1547  AST 42*  ALT 36*  ALKPHOS 133*  BILITOT <0.2*  PROT 6.7  ALBUMIN 2.5*   No results found for this basename: LIPASE, AMYLASE,  in the  last 168 hours No results found for this basename: AMMONIA,  in the last 168 hours CBC:  Recent Labs Lab 10/28/13 1547 10/29/13 0406 10/30/13 0300 10/31/13 0315  WBC 7.6 9.7 9.9 4.9  NEUTROABS 4.8  --   --   --   HGB 10.7* 9.8* 9.1* 13.6  HCT 32.2* 29.2* 27.1* 40.8  MCV 100.9* 99.0 98.5 98.3  PLT 95* 88* 75* 47*   Cardiac Enzymes:  Recent Labs Lab 10/28/13 1547 10/28/13 1930 10/29/13 0105 10/29/13 0700 10/30/13 0300  TROPONINI 0.31* 1.94* 3.56* 9.33* 4.07*   BNP (last 3 results)  Recent Labs  10/28/13 1547  PROBNP 488.2*   CBG:  Recent Labs Lab 10/28/13 1531 10/28/13 2031 10/28/13 2258  GLUCAP 104* 67* 99    Recent Results (from the past 240 hour(s))  MRSA PCR SCREENING     Status: None   Collection Time    10/28/13  7:58 PM      Result Value Range Status   MRSA by PCR NEGATIVE  NEGATIVE Final   Comment:            The GeneXpert MRSA Assay (FDA     approved for NASAL specimens     only), is  one component of a     comprehensive MRSA colonization     surveillance program. It is not     intended to diagnose MRSA     infection nor to guide or     monitor treatment for     MRSA infections.  URINE CULTURE     Status: None   Collection Time    10/29/13  5:15 AM      Result Value Range Status   Specimen Description URINE, CATHETERIZED   Final   Special Requests NONE   Final   Culture  Setup Time     Final   Value: 10/29/2013 06:19     Performed at [place]     Final   Value: 75,000 COLONIES/ML     Performed at [facility]   Culture     Final   Value: KLEBSIELLA PNEUMONIAE     Performed at [facility]   Report Status PENDING   Incomplete   Organism ID, Bacteria KLEBSIELLA PNEUMONIAE   Final     Studies:  Recent x-ray studies have been reviewed in detail by the Attending Physician  Time spent :     Erin Hearing, ANP [facility]  [phone] Pager [phone]  **If unable to  reach the above provider after paging please contact the [place] @ [phone]  On-Call/Text Page:      [email or web address]      password [email or web address]  If 7PM-7AM, please contact night-coverage [email or web address] Password [email or web address] 10/31/2013, 3:40 PM   LOS: 3 days

## 2013-11-01 DIAGNOSIS — I214 Non-ST elevation (NSTEMI) myocardial infarction: Secondary | ICD-10-CM

## 2013-11-01 LAB — URINE CULTURE: Colony Count: 75000

## 2013-11-01 LAB — CBC
HCT: 29.2 % — ABNORMAL LOW (ref 36.0–46.0)
HEMOGLOBIN: 10 g/dL — AB (ref 12.0–15.0)
MCH: 33.6 pg (ref 26.0–34.0)
MCHC: 34.2 g/dL (ref 30.0–36.0)
MCV: 98 fL (ref 78.0–100.0)
Platelets: 76 10*3/uL — ABNORMAL LOW (ref 150–400)
RBC: 2.98 MIL/uL — ABNORMAL LOW (ref 3.87–5.11)
RDW: 16.2 % — ABNORMAL HIGH (ref 11.5–15.5)
WBC: 8.2 10*3/uL (ref 4.0–10.5)

## 2013-11-01 LAB — GLUCOSE, CAPILLARY
GLUCOSE-CAPILLARY: 80 mg/dL (ref 70–99)
Glucose-Capillary: 114 mg/dL — ABNORMAL HIGH (ref 70–99)
Glucose-Capillary: 103 mg/dL — ABNORMAL HIGH (ref 70–99)

## 2013-11-01 MED ORDER — ATORVASTATIN CALCIUM 10 MG PO TABS: 10.0000 mg | ORAL_TABLET | Freq: Every day | ORAL | Status: DC

## 2013-11-01 MED ORDER — CARVEDILOL 3.125 MG PO TABS: 3.1250 mg | ORAL_TABLET | Freq: Two times a day (BID) | ORAL | Status: DC

## 2013-11-01 NOTE — Discharge Summary (Addendum)
Physician Discharge Summary  Dawn [name] [id number] DOB: 04/05/1946 DOA: 10/28/2013  PCP: [name]  Admit date: 10/28/2013 Discharge date: 11/02/2013  Time spent: >45 minutes: Discharge delayed until 11/02/13 due to staffing issues at group home  Recommendations for Outpatient Follow-up:  1. oupt f/u with PCP in 1-2 wks 2. CBC in one week 3. Goals of care re: FTT and evolving EOL issues- recommend MOST form completion  Discharge Diagnoses:  Principal Problem:   Atrial fibrillation with rapid ventricular response Active Problems:   Hypotension   Dehydration   Hypothyroidism   Non-ST elevation myocardial infarction (NSTEMI)   Thrombocytopenia   Anemia   Seizure disorder   MR (mental retardation)   HLD (hyperlipidemia)   Diastolic heart failure, NYHA class 2   Discharge Condition: stable   Diet recommendation: heart healthy  Filed Weights   10/30/13 0510 10/31/13 0354 11/01/13 0440  Weight: 131 lb 9.8 oz (59.7 kg) 134 lb 11.2 oz (61.1 kg) 137 lb 2 oz (62.2 kg)    History of present illness:  68 -year-old female with developmental disability and seizure disorder. Brought to the emergency department from her group home because of changes in behavior which were not typical for her. Patient is normally nonverbal and visually impaired so has very limited communication. Initially it was felt the patient may have had seizure activity but she displayed no typical postictal phase. Upon arrival to the emergency department she was found to be in atrial fibrillation with RVR which would be a new problem for her. The staff did note that the patient had not been drinking enough fluids over the past one to 2 days. She not go had any other constitutional symptoms or any signs of GI bleeding. In addition to the atrial fibrillation with RVR the patient has a subtle elevation in troponin.   Hospital Course:  Atrial fibrillation with rapid ventricular response  -Converted to NSR @  3 am  On 1/15  -Appreciate cardiology assistance  -started low dose Coreg 1/15 and will continue after dc -Agree with Cardiology not good candidate for anticoagulation with history of seizures and current thrombocytopenia noting [place] Vasc score only 2  Hypotension  -Multifactorial: pre admission volume depletion, IV Cardizem infusion, a-fib with RVR  - now resolved   Klebsiella UTI -75,000 colonies but given presentation will treat as active infection -give Ceftin x 7 days  Dehydration/FTT -hydrated aggressively during the hospital stay - continue to ensure she has appropriate by mouth intake of liquids -based on anemia panel seems to have chronic anemia c/w malnutrition -need to clarify with guardian goals of care and any potential living will recommendations- needs to have MOST form completed after discharge.  Hypothyroidism  -Continue Synthroid  -TSH 1.26   NSTEMI? Elevated troponin  -Has peaked to 9.33- and has resolved -Cardiology also monitoring  -Echocardiogram this admission with preserved LV function and no regional wall motion abnormalities  -Not appropriate candidate for cath or stress testing - recommended aspirin and beta blocker only   New finding grade 2 diastolic dysfunction  -Appears to be compensated without any respiratory symptoms   Thrombocytopenia  - chronic and liklel precipitated by malnutrition  Anemia  -Chronic disease nature based on anemia panel -Hgb after rehydration was 8.8- will need FU CBC in one week  Seizure disorder  -Continue home medications   MR (mental retardation)   HLD (hyperlipidemia)   Procedures:  none  Consultations:  Cardiology  Discharge Exam: Filed Vitals:   11/02/13  1055  BP: 96/65  Pulse: 94  Temp:   Resp:    General: No acute respiratory distress  Lungs: Clear to auscultation bilaterally without wheezes or crackles, RA  Cardiovascular: Regular somewhat tachycardic rate with SINUS rhythm without murmur  gallop or rub normal S1 and S2, no peripheral edema or JVD  Abdomen: Nontender, nondistended, soft, bowel sounds positive, no rebound, no ascites, no appreciable mass  Musculoskeletal: No significant cyanosis, clubbing of bilateral lower extremities  Neurological: Awake but due to nonverbal state chronically unable to ascertain orientation, has spontaneous movement of all 4 extremities, prefers keeping legs flexed the chest but no evidence of contractures    Discharge Instructions      Discharge Orders   Future Orders Complete By Expires   Call MD for:  difficulty breathing, headache or visual disturbances  As directed    Call MD for:  persistant nausea and vomiting  As directed    Call MD for:  temperature >100.4  As directed    Diet - low sodium heart healthy  As directed         Increase activity slowly  As directed             Medication List         atorvastatin 10 MG tablet  Commonly known as:  LIPITOR  Take 10 mg by mouth daily.     camphor-menthol lotion  Commonly known as:  SARNA  Apply 1 application topically 2 (two) times daily.     carvedilol 3.125 MG tablet  Commonly known as:  COREG  Take 1 tablet (3.125 mg total) by mouth 2 (two) times daily with a meal.     cefUROXime 250 MG/5ML suspension  Commonly known as:  CEFTIN  Take 5 mLs (250 mg total) by mouth 2 (two) times daily.     clobetasol 0.05 % external solution  Commonly known as:  TEMOVATE  Apply 1 application topically at bedtime.     divalproex 250 MG DR tablet  Commonly known as:  DEPAKOTE  Take 750 mg by mouth 3 (three) times daily.     levETIRAcetam 750 MG tablet  Commonly known as:  KEPPRA  Take 750 mg by mouth 3 (three) times daily.     levothyroxine 75 MCG tablet  Commonly known as:  SYNTHROID, LEVOTHROID  Take 75 mcg by mouth daily.     nitrofurantoin 50 MG capsule  Commonly known as:  MACRODANTIN  Take 50 mg by mouth at bedtime.     OYSTER CALCIUM 500 + D 500-200 MG-UNIT per  tablet  Generic drug:  calcium-vitamin D  Take 1 tablet by mouth daily.     tolnaftate 1 % spray  Commonly known as:  TINACTIN  Apply 1 application topically at bedtime.     vitamin C 500 MG tablet  Commonly known as:  ASCORBIC ACID  Take 500 mg by mouth 3 (three) times daily.       Allergies  Allergen Reactions  . Ppd [Tuberculin Purified Protein Derivative]     Per MAR      The results of significant diagnostics from this hospitalization (including imaging, microbiology, ancillary and laboratory) are listed below for reference.    Significant Diagnostic Studies: Dg Chest Port 1 View  10/28/2013   CLINICAL DATA:  Seizure with pain  EXAM: PORTABLE CHEST - 1 VIEW  COMPARISON:  Chest CT December 29, 2010 and chest radiograph January 14, 2008  FINDINGS: The degree of inspiration is shallow.  There is no edema or consolidation. Heart size and pulmonary vascularity are normal. No pneumothorax. No adenopathy. No bone lesions.  IMPRESSION: No edema or consolidation.  Note shallow degree of inspiration.   Electronically Signed   By: Lowella Grip M.D.   On: 10/28/2013 15:47    Microbiology: Recent Results (from the past 240 hour(s))  MRSA PCR SCREENING     Status: None   Collection Time    10/28/13  7:58 PM      Result Value Range Status   MRSA by PCR NEGATIVE  NEGATIVE Final   Comment:            The GeneXpert MRSA Assay (FDA     approved for NASAL specimens     only), is one component of a     comprehensive MRSA colonization     surveillance program. It is not     intended to diagnose MRSA     infection nor to guide or     monitor treatment for     MRSA infections.  URINE CULTURE     Status: None   Collection Time    10/29/13  5:15 AM      Result Value Range Status   Specimen Description URINE, CATHETERIZED   Final   Special Requests NONE   Final   Culture  Setup Time     Final   Value: 10/29/2013 06:19     Performed at [facility]     Final    Value: 75,000 COLONIES/ML     Performed at [facility]   Culture     Final   Value: KLEBSIELLA PNEUMONIAE     Performed at [facility]   Report Status 11/01/2013 FINAL   Final   Organism ID, Bacteria KLEBSIELLA PNEUMONIAE   Final     Labs: Basic Metabolic Panel:  Recent Labs Lab 10/28/13 1547 10/28/13 2136 10/29/13 0406 10/31/13 0315  NA 148*  --  145 151*  K 4.0  --  4.3 3.9  CL 107  --  111 116*  CO2 29  --  27 25  GLUCOSE 109*  --  77 65*  BUN 26*  --  21 9  CREATININE 0.60  --  0.54 0.53  CALCIUM 9.2  --  8.3* 8.6  MG  --  1.8  --   --    Liver Function Tests:  Recent Labs Lab 10/28/13 1547  AST 42*  ALT 36*  ALKPHOS 133*  BILITOT <0.2*  PROT 6.7  ALBUMIN 2.5*   No results found for this basename: LIPASE, AMYLASE,  in the last 168 hours No results found for this basename: AMMONIA,  in the last 168 hours CBC:  Recent Labs Lab 10/28/13 1547 10/29/13 0406 10/30/13 0300 10/31/13 0315 11/01/13 0457 11/02/13 0345  WBC 7.6 9.7 9.9 4.9 8.2 8.2  NEUTROABS 4.8  --   --   --   --   --   HGB 10.7* 9.8* 9.1* 13.6 10.0* 8.8*  HCT 32.2* 29.2* 27.1* 40.8 29.2* 25.8*  MCV 100.9* 99.0 98.5 98.3 98.0 98.9  PLT 95* 88* 75* 47* 76* 77*   Cardiac Enzymes:  Recent Labs Lab 10/28/13 1547 10/28/13 1930 10/29/13 0105 10/29/13 0700 10/30/13 0300  TROPONINI 0.31* 1.94* 3.56* 9.33* 4.07*   BNP: BNP (last 3 results)  Recent Labs  10/28/13 1547  PROBNP 488.2*   CBG:  Recent Labs Lab 11/01/13 1126 11/01/13 1622 11/02/13 0622  11/02/13 0705 11/02/13 1110  GLUCAP 114* 103* 63* 89 88       Signed:  Debbe Odea MD  [facility] 11/01/2013, 11:22 AM

## 2013-11-01 NOTE — Progress Notes (Signed)
CSW spoke with group home and MD.  Unable to accept pt back today due to staffing issues, and having RN or doctor approve discharge orders.  Plan for pt to discharge Monday 1/19.  CSW continues to follow to assist with discharge planning.  [phone] (weekend CSW)

## 2013-11-01 NOTE — Progress Notes (Signed)
PT Cancellation Note  Patient Details Name: Dawn [name] MRN: [id number] DOB: 05-Nov-1945   Cancelled Treatment:    Reason Eval/Treat Not Completed: PT screened, no needs identified, will sign off Spoke with RN.  Pt was w/c dependent with hoyer lift for transfers PTA.    Dawn [name] 11/01/2013, 8:57 AM  Lorrin Goodell, PT  Office # [phone] Pager [phone]

## 2013-11-02 LAB — GLUCOSE, CAPILLARY
GLUCOSE-CAPILLARY: 89 mg/dL (ref 70–99)
GLUCOSE-CAPILLARY: 88 mg/dL (ref 70–99)
Glucose-Capillary: 63 mg/dL — ABNORMAL LOW (ref 70–99)

## 2013-11-02 LAB — CBC
HCT: 25.8 % — ABNORMAL LOW (ref 36.0–46.0)
Hemoglobin: 8.8 g/dL — ABNORMAL LOW (ref 12.0–15.0)
MCH: 33.7 pg (ref 26.0–34.0)
MCHC: 34.1 g/dL (ref 30.0–36.0)
MCV: 98.9 fL (ref 78.0–100.0)
PLATELETS: 77 10*3/uL — AB (ref 150–400)
RBC: 2.61 MIL/uL — ABNORMAL LOW (ref 3.87–5.11)
RDW: 16.3 % — AB (ref 11.5–15.5)
WBC: 8.2 10*3/uL (ref 4.0–10.5)

## 2013-11-02 MED ORDER — CEFUROXIME AXETIL 250 MG/5ML PO SUSR: 250.0000 mg | Freq: Two times a day (BID) | ORAL | Status: AC

## 2013-11-02 NOTE — Progress Notes (Addendum)
Update: [name] from group home stated they will pick her up this afternoon. CSW notified MD   CSW spoke to [name] from group home to inform her of dc today. [name] asked for dc summary by fax and then stated she will call social worker back to arrange transportation.  Jeanette Caprice, MSW, [place]

## 2013-11-02 NOTE — Progress Notes (Addendum)
[facility] Progress Note    Dawn [name] [id number] DOB: November 24, 1945 DOA: 10/28/2013 PCP: [name]  Brief narrative: 68 -year-old female with developmental disability and seizure disorder. Brought to the emergency department from her group home because of changes in behavior which were not typical for her. Patient is normally nonverbal and visually impaired so has very limited communication. Initially it was felt the patient may have had seizure activity but she displayed no typical postictal phase. Upon arrival to the emergency department she was found to be in atrial fibrillation with RVR which would be a new problem for her. The staff did note that the patient had not been drinking enough fluids over the past one to 2 days. She not go had any other constitutional symptoms or any signs of GI bleeding. In addition to the atrial fibrillation with RVR the patient has a subtle elevation in troponin.  Assessment/Plan:    Atrial fibrillation with rapid ventricular response -Converted to NSR @ 3 am On 1/15  -Appreciate cardiology assistance  -started low dose Coreg 1/15 and will continue after dc  -Agree with Cardiology not good candidate for anticoagulation with history of seizures and current thrombocytopenia noting [place] Vasc score only 2    Hypotension -Multifactorial: pre admission volume depletion, IV Cardizem infusion, RVR -resolved    Klebsiella UTI  -75,000 colonies but given presentation will treat as active infection  -give Ceftin x 7 days  Thrombocytopenia - chronic and likely precipitated by malnutrition  Dehydration/FTT  -hydrated aggressively during the hospital stay  - continue to ensure she has appropriate by mouth intake of liquids  -based on anemia panel seems to have chronic anemia c/w malnutrition  -need to clarify with guardian goals of care and any potential living will recommendations- needs to have MOST form completed after  discharge  Hypothyroidism -Continue Synthroid -TSH 1.26   NSTEMI? Elevated troponin  -Has peaked to 9.33- and has resolved  -Cardiology also monitoring  -Echocardiogram this admission with preserved LV function and no regional wall motion abnormalities  -Not appropriate candidate for cath or stress testing - recommended aspirin and beta blocker only    New finding grade 2 diastolic dysfunction -Appears to be compensated without any respiratory symptoms    Anemia -Chronic disease nature based on anemia panel  -Hgb after rehydration was 8.8- will need FU CBC in one week   Seizure disorder -Continue home medications    MR (mental retardation)    HLD (hyperlipidemia)   DVT prophylaxis: Heparin Code Status: Full Family Communication: No family at bedside Disposition Plan/Expected LOS: Return to group home today- please see dc summary completed 11/01/13   Consultants: Cardiology  Procedures: 2-D echocardiogram - Procedure narrative: Transthoracic echocardiography. Image quality was poor. The study was technically difficult, as a result of poor acoustic windows, poor patient compliance, and restricted patient mobility. - Left ventricle: The cavity size was normal. There was mild focal basal hypertrophy of the septum. Systolic function was normal. The estimated ejection fraction was in the range of 60% to 65%. Wall motion was normal; there were no regional wall motion abnormalities. Features are consistent with a pseudonormal left ventricular filling pattern, with concomitant abnormal relaxation and increased filling pressure (grade 2 diastolic dysfunction).   Antibiotics: None  HPI/Subjective: Patient nonverbal. Appears comfortable and in no distress  Objective: Blood pressure 96/65, pulse 94, temperature 98.9 F (37.2 C), temperature source Oral, resp. rate 19, height 5\' 4"  (1.626 m), weight 137 lb 2 oz (62.2 kg),  SpO2 96.00%.  Intake/Output Summary (Last 24  hours) at 11/02/13 1124 Last data filed at 11/01/13 1800  Gross per 24 hour  Intake    360 ml  Output      0 ml  Net    360 ml     Exam: General: No acute respiratory distress Lungs: Clear to auscultation bilaterally without wheezes or crackles, RA Cardiovascular: Regular somewhat tachycardic rate with SINUS rhythm without murmur gallop or rub normal S1 and S2, no peripheral edema or JVD Abdomen: Nontender, nondistended, soft, bowel sounds positive, no rebound, no ascites, no appreciable mass Musculoskeletal: No significant cyanosis, clubbing of bilateral lower extremities Neurological: Awake but due to nonverbal state chronically unable to ascertain orientation, has spontaneous movement of all 4 extremities, prefers keeping legs flexed the chest but no evidence of contractures  Scheduled Meds:  Scheduled Meds: . aspirin EC  81 mg Oral Daily  . atorvastatin  10 mg Oral Daily  . calcium-vitamin D  1 tablet Oral Daily  . carvedilol  3.125 mg Oral BID WC  . divalproex  750 mg Oral TID  . feeding supplement (ENSURE)  1 Container Oral TID BM  . levETIRAcetam  750 mg Oral TID  . levothyroxine  75 mcg Oral Daily  . pantoprazole  40 mg Oral Q1200  . vitamin C  500 mg Oral TID   Continuous Infusions:    **Reviewed in detail by the Attending Physician  Data Reviewed: Basic Metabolic Panel:  Recent Labs Lab 10/28/13 1547 10/28/13 2136 10/29/13 0406 10/31/13 0315  NA 148*  --  145 151*  K 4.0  --  4.3 3.9  CL 107  --  111 116*  CO2 29  --  27 25  GLUCOSE 109*  --  77 65*  BUN 26*  --  21 9  CREATININE 0.60  --  0.54 0.53  CALCIUM 9.2  --  8.3* 8.6  MG  --  1.8  --   --    Liver Function Tests:  Recent Labs Lab 10/28/13 1547  AST 42*  ALT 36*  ALKPHOS 133*  BILITOT <0.2*  PROT 6.7  ALBUMIN 2.5*   No results found for this basename: LIPASE, AMYLASE,  in the last 168 hours No results found for this basename: AMMONIA,  in the last 168 hours CBC:  Recent Labs Lab  10/28/13 1547 10/29/13 0406 10/30/13 0300 10/31/13 0315 11/01/13 0457 11/02/13 0345  WBC 7.6 9.7 9.9 4.9 8.2 8.2  NEUTROABS 4.8  --   --   --   --   --   HGB 10.7* 9.8* 9.1* 13.6 10.0* 8.8*  HCT 32.2* 29.2* 27.1* 40.8 29.2* 25.8*  MCV 100.9* 99.0 98.5 98.3 98.0 98.9  PLT 95* 88* 75* 47* 76* 77*   Cardiac Enzymes:  Recent Labs Lab 10/28/13 1547 10/28/13 1930 10/29/13 0105 10/29/13 0700 10/30/13 0300  TROPONINI 0.31* 1.94* 3.56* 9.33* 4.07*   BNP (last 3 results)  Recent Labs  10/28/13 1547  PROBNP 488.2*   CBG:  Recent Labs Lab 11/01/13 1126 11/01/13 1622 11/02/13 0622 11/02/13 0705 11/02/13 1110  GLUCAP 114* 103* 63* 89 88    Recent Results (from the past 240 hour(s))  MRSA PCR SCREENING     Status: None   Collection Time    10/28/13  7:58 PM      Result Value Range Status   MRSA by PCR NEGATIVE  NEGATIVE Final   Comment:  The GeneXpert MRSA Assay (FDA     approved for NASAL specimens     only), is one component of a     comprehensive MRSA colonization     surveillance program. It is not     intended to diagnose MRSA     infection nor to guide or     monitor treatment for     MRSA infections.  URINE CULTURE     Status: None   Collection Time    10/29/13  5:15 AM      Result Value Range Status   Specimen Description URINE, CATHETERIZED   Final   Special Requests NONE   Final   Culture  Setup Time     Final   Value: 10/29/2013 06:19     Performed at [facility]     Final   Value: 75,000 COLONIES/ML     Performed at [facility]   Culture     Final   Value: KLEBSIELLA PNEUMONIAE     Performed at [facility]   Report Status 11/01/2013 FINAL   Final   Organism ID, Bacteria KLEBSIELLA PNEUMONIAE   Final     Studies:  Recent x-ray studies have been reviewed in detail by the Attending Physician  Time spent :     Erin Hearing, ANP [facility] Office  [phone] Pager  [phone]  **If unable to reach the above provider after paging please contact the [facility] @ [phone]  On-Call/Text Page:      [email or web address]      password [email or web address]  If 7PM-7AM, please contact night-coverage [email or web address] Password [email or web address] 11/02/2013, 11:24 AM   LOS: 5 days   I have personally examined this patient and reviewed the entire database. I have reviewed the above note, made any necessary editorial changes, and agree with its content.  Cherene Altes, MD [facility]

## 2013-11-02 NOTE — Progress Notes (Signed)
Clinical Social Worker facilitated patient discharge by contacting patient's group home. The group home is setting up transport to pick patient up. CSW will sign off, as social work intervention is no longer needed.  Jeanette Caprice, MSW, [place]

## 2013-11-02 NOTE — Progress Notes (Signed)
Assisted patient in getting dressed. Used lift to transfer patient to personal wheelchair. Caregiver from patient's group home her to transport patient back to home. Belongings with caregiver.Cindee Salt

## 2013-11-02 NOTE — Progress Notes (Signed)
Hypoglycemic Event  CBG: 63  Treatment: applesauce  Symptoms: none  Follow-up CBG: Time:0700 CBG Result:83  Possible Reasons for Event: unknown  Comments/MD notified:no    [place] Shark, [place]  Remember to initiate Hypoglycemia Order Set & complete

## 2013-11-17 ENCOUNTER — Other Ambulatory Visit: Payer: Self-pay | Admitting: Family Medicine

## 2013-11-17 DIAGNOSIS — Z1231 Encounter for screening mammogram for malignant neoplasm of breast: Secondary | ICD-10-CM

## 2013-12-15 ENCOUNTER — Emergency Department (HOSPITAL_COMMUNITY): Payer: Medicare Other

## 2013-12-15 ENCOUNTER — Encounter (HOSPITAL_COMMUNITY): Payer: Self-pay | Admitting: Emergency Medicine

## 2013-12-15 ENCOUNTER — Inpatient Hospital Stay (HOSPITAL_COMMUNITY)
Admission: EM | Admit: 2013-12-15 | Discharge: 2013-12-21 | DRG: 948 | Disposition: A | Discharge status: Home / Self Care | Payer: Medicare Other | Attending: Internal Medicine | Admitting: Internal Medicine

## 2013-12-15 DIAGNOSIS — E039 Hypothyroidism, unspecified: Secondary | ICD-10-CM

## 2013-12-15 DIAGNOSIS — D72829 Elevated white blood cell count, unspecified: Secondary | ICD-10-CM

## 2013-12-15 DIAGNOSIS — R627 Adult failure to thrive: Secondary | ICD-10-CM | POA: Diagnosis present

## 2013-12-15 DIAGNOSIS — R509 Fever, unspecified: Secondary | ICD-10-CM

## 2013-12-15 DIAGNOSIS — K5900 Constipation, unspecified: Secondary | ICD-10-CM | POA: Diagnosis present

## 2013-12-15 DIAGNOSIS — E860 Dehydration: Secondary | ICD-10-CM

## 2013-12-15 DIAGNOSIS — I5030 Unspecified diastolic (congestive) heart failure: Secondary | ICD-10-CM

## 2013-12-15 DIAGNOSIS — I2510 Atherosclerotic heart disease of native coronary artery without angina pectoris: Secondary | ICD-10-CM | POA: Diagnosis present

## 2013-12-15 DIAGNOSIS — M810 Age-related osteoporosis without current pathological fracture: Secondary | ICD-10-CM | POA: Diagnosis present

## 2013-12-15 DIAGNOSIS — T68XXXA Hypothermia, initial encounter: Secondary | ICD-10-CM

## 2013-12-15 DIAGNOSIS — F79 Unspecified intellectual disabilities: Secondary | ICD-10-CM

## 2013-12-15 DIAGNOSIS — Z79899 Other long term (current) drug therapy: Secondary | ICD-10-CM

## 2013-12-15 DIAGNOSIS — R6510 Systemic inflammatory response syndrome (SIRS) of non-infectious origin without acute organ dysfunction: Secondary | ICD-10-CM

## 2013-12-15 DIAGNOSIS — I214 Non-ST elevation (NSTEMI) myocardial infarction: Secondary | ICD-10-CM

## 2013-12-15 DIAGNOSIS — R740 Nonspecific elevation of levels of transaminase and lactic acid dehydrogenase [LDH]: Secondary | ICD-10-CM

## 2013-12-15 DIAGNOSIS — H543 Unqualified visual loss, both eyes: Secondary | ICD-10-CM | POA: Diagnosis present

## 2013-12-15 DIAGNOSIS — R1310 Dysphagia, unspecified: Secondary | ICD-10-CM | POA: Diagnosis present

## 2013-12-15 DIAGNOSIS — D696 Thrombocytopenia, unspecified: Secondary | ICD-10-CM

## 2013-12-15 DIAGNOSIS — R7402 Elevation of levels of lactic acid dehydrogenase (LDH): Secondary | ICD-10-CM | POA: Diagnosis present

## 2013-12-15 DIAGNOSIS — R680 Hypothermia, not associated with low environmental temperature: Principal | ICD-10-CM | POA: Diagnosis present

## 2013-12-15 DIAGNOSIS — D649 Anemia, unspecified: Secondary | ICD-10-CM

## 2013-12-15 DIAGNOSIS — G40909 Epilepsy, unspecified, not intractable, without status epilepticus: Secondary | ICD-10-CM

## 2013-12-15 DIAGNOSIS — R7401 Elevation of levels of liver transaminase levels: Secondary | ICD-10-CM | POA: Diagnosis present

## 2013-12-15 DIAGNOSIS — I959 Hypotension, unspecified: Secondary | ICD-10-CM

## 2013-12-15 DIAGNOSIS — E785 Hyperlipidemia, unspecified: Secondary | ICD-10-CM

## 2013-12-15 DIAGNOSIS — G8220 Paraplegia, unspecified: Secondary | ICD-10-CM | POA: Diagnosis present

## 2013-12-15 DIAGNOSIS — Z810 Family history of intellectual disabilities: Secondary | ICD-10-CM

## 2013-12-15 DIAGNOSIS — I4891 Unspecified atrial fibrillation: Secondary | ICD-10-CM

## 2013-12-15 LAB — CBC WITH DIFFERENTIAL/PLATELET
BASOS PCT: 0 % (ref 0–1)
Basophils Absolute: 0 10*3/uL (ref 0.0–0.1)
Eosinophils Absolute: 0.1 10*3/uL (ref 0.0–0.7)
Eosinophils Relative: 3 % (ref 0–5)
HCT: 27.2 % — ABNORMAL LOW (ref 36.0–46.0)
Hemoglobin: 9.3 g/dL — ABNORMAL LOW (ref 12.0–15.0)
Lymphocytes Relative: 35 % (ref 12–46)
Lymphs Abs: 2 10*3/uL (ref 0.7–4.0)
MCH: 34.6 pg — AB (ref 26.0–34.0)
MCHC: 34.2 g/dL (ref 30.0–36.0)
MCV: 101.1 fL — ABNORMAL HIGH (ref 78.0–100.0)
MONOS PCT: 5 % (ref 3–12)
Monocytes Absolute: 0.3 10*3/uL (ref 0.1–1.0)
NEUTROS ABS: 3.3 10*3/uL (ref 1.7–7.7)
Neutrophils Relative %: 58 % (ref 43–77)
PLATELETS: 73 10*3/uL — AB (ref 150–400)
RBC: 2.69 MIL/uL — AB (ref 3.87–5.11)
RDW: 15.9 % — ABNORMAL HIGH (ref 11.5–15.5)
WBC: 5.7 10*3/uL (ref 4.0–10.5)

## 2013-12-15 LAB — I-STAT TROPONIN, ED: Troponin i, poc: 0.06 ng/mL (ref 0.00–0.08)

## 2013-12-15 LAB — CBG MONITORING, ED: Glucose-Capillary: 90 mg/dL (ref 70–99)

## 2013-12-15 LAB — URINALYSIS, ROUTINE W REFLEX MICROSCOPIC
BILIRUBIN URINE: NEGATIVE
Glucose, UA: NEGATIVE mg/dL
Hgb urine dipstick: NEGATIVE
Ketones, ur: 15 mg/dL — AB
LEUKOCYTES UA: NEGATIVE
Nitrite: NEGATIVE
Protein, ur: NEGATIVE mg/dL
Specific Gravity, Urine: 1.027 (ref 1.005–1.030)
UROBILINOGEN UA: 1 mg/dL (ref 0.0–1.0)
pH: 5.5 (ref 5.0–8.0)

## 2013-12-15 LAB — HEPATIC FUNCTION PANEL
ALK PHOS: 195 U/L — AB (ref 39–117)
ALT: 56 U/L — AB (ref 0–35)
AST: 56 U/L — ABNORMAL HIGH (ref 0–37)
Albumin: 2.6 g/dL — ABNORMAL LOW (ref 3.5–5.2)
Bilirubin, Direct: <0.2 mg/dL (ref 0.0–0.3)
Total Bilirubin: <0.2 mg/dL — ABNORMAL LOW (ref 0.3–1.2)
Total Protein: 7.2 g/dL (ref 6.0–8.3)

## 2013-12-15 LAB — ABO/RH: ABO/RH(D): B POS

## 2013-12-15 LAB — BASIC METABOLIC PANEL
BUN: 26 mg/dL — ABNORMAL HIGH (ref 6–23)
CHLORIDE: 107 meq/L (ref 96–112)
CO2: 29 mEq/L (ref 19–32)
Calcium: 9.2 mg/dL (ref 8.4–10.5)
Creatinine, Ser: 0.62 mg/dL (ref 0.50–1.10)
GFR calc Af Amer: >90 mL/min (ref >90)
Glucose, Bld: 97 mg/dL (ref 70–99)
POTASSIUM: 5.1 meq/L (ref 3.7–5.3)
SODIUM: 146 meq/L (ref 137–147)

## 2013-12-15 LAB — GLUCOSE, CAPILLARY
GLUCOSE-CAPILLARY: 47 mg/dL — AB (ref 70–99)
GLUCOSE-CAPILLARY: 114 mg/dL — AB (ref 70–99)

## 2013-12-15 LAB — TYPE AND SCREEN
ABO/RH(D): B POS
ANTIBODY SCREEN: NEGATIVE

## 2013-12-15 LAB — LACTIC ACID, PLASMA: LACTIC ACID, VENOUS: 2.5 mmol/L — AB (ref 0.5–2.2)

## 2013-12-15 LAB — POC OCCULT BLOOD, ED: Fecal Occult Bld: NEGATIVE

## 2013-12-15 LAB — MRSA PCR SCREENING: MRSA by PCR: NEGATIVE

## 2013-12-15 MED ORDER — TOLNAFTATE 1 % EX CREA
TOPICAL_CREAM | Freq: Every day | CUTANEOUS | Status: DC
  Filled 2013-12-15: qty 30

## 2013-12-15 MED ORDER — ONDANSETRON HCL 4 MG/2ML IJ SOLN: 4.0000 mg | Freq: Four times a day (QID) | INTRAMUSCULAR | Status: DC | PRN

## 2013-12-15 MED ORDER — CLOTRIMAZOLE 1 % EX CREA
TOPICAL_CREAM | Freq: Two times a day (BID) | CUTANEOUS | Status: DC
  Administered 2013-12-16: 1 via TOPICAL
  Administered 2013-12-16 – 2013-12-17 (×2): via TOPICAL
  Administered 2013-12-17: 1 via TOPICAL
  Administered 2013-12-18 – 2013-12-19 (×3): via TOPICAL
  Administered 2013-12-19: 1 via TOPICAL
  Administered 2013-12-20: 10:00:00 via TOPICAL
  Administered 2013-12-20: 1 via TOPICAL
  Administered 2013-12-21: 11:00:00 via TOPICAL
  Filled 2013-12-15: qty 15

## 2013-12-15 MED ORDER — POLYETHYLENE GLYCOL 3350 17 G PO PACK
17.0000 g | PACK | Freq: Every day | ORAL | Status: DC
  Administered 2013-12-16 – 2013-12-21 (×5): 17 g via ORAL
  Filled 2013-12-15 (×6): qty 1

## 2013-12-15 MED ORDER — ATORVASTATIN CALCIUM 10 MG PO TABS
10.0000 mg | ORAL_TABLET | Freq: Every day | ORAL | Status: DC
  Administered 2013-12-15 – 2013-12-20 (×6): 10 mg via ORAL
  Filled 2013-12-15 (×8): qty 1

## 2013-12-15 MED ORDER — PIPERACILLIN-TAZOBACTAM 3.375 G IVPB
3.3750 g | Freq: Three times a day (TID) | INTRAVENOUS | Status: DC
  Administered 2013-12-16 (×3): 3.375 g via INTRAVENOUS
  Filled 2013-12-15 (×4): qty 50

## 2013-12-15 MED ORDER — LEVETIRACETAM 750 MG PO TABS
750.0000 mg | ORAL_TABLET | Freq: Three times a day (TID) | ORAL | Status: DC
  Administered 2013-12-15 – 2013-12-20 (×16): 750 mg via ORAL
  Filled 2013-12-15 (×19): qty 1

## 2013-12-15 MED ORDER — SODIUM CHLORIDE 0.9 % IV SOLN
INTRAVENOUS | Status: DC
  Administered 2013-12-15: 16:00:00 via INTRAVENOUS

## 2013-12-15 MED ORDER — DEXTROSE-NACL 5-0.9 % IV SOLN
INTRAVENOUS | Status: DC
  Administered 2013-12-15 – 2013-12-16 (×2): via INTRAVENOUS
  Administered 2013-12-16: 1 mL via INTRAVENOUS
  Administered 2013-12-16: 05:00:00 via INTRAVENOUS

## 2013-12-15 MED ORDER — DIVALPROEX SODIUM 500 MG PO DR TAB
750.0000 mg | DELAYED_RELEASE_TABLET | Freq: Three times a day (TID) | ORAL | Status: DC
  Administered 2013-12-15 – 2013-12-17 (×5): 750 mg via ORAL
  Filled 2013-12-15 (×7): qty 1

## 2013-12-15 MED ORDER — VANCOMYCIN HCL IN DEXTROSE 1-5 GM/200ML-% IV SOLN
1000.0000 mg | Freq: Once | INTRAVENOUS | Status: DC
  Filled 2013-12-15: qty 200

## 2013-12-15 MED ORDER — ONDANSETRON HCL 4 MG PO TABS
4.0000 mg | ORAL_TABLET | Freq: Four times a day (QID) | ORAL | Status: DC | PRN
Start: 2013-12-15 — End: 2013-12-21

## 2013-12-15 MED ORDER — CLOBETASOL PROPIONATE 0.05 % EX SOLN: 1.0000 "application " | Freq: Every day | CUTANEOUS | Status: DC

## 2013-12-15 MED ORDER — LEVOTHYROXINE SODIUM 75 MCG PO TABS
75.0000 ug | ORAL_TABLET | Freq: Every day | ORAL | Status: DC
  Administered 2013-12-16 – 2013-12-21 (×6): 75 ug via ORAL
  Filled 2013-12-15 (×7): qty 1

## 2013-12-15 MED ORDER — SODIUM CHLORIDE 0.9 % IV SOLN
500.0000 mg | Freq: Two times a day (BID) | INTRAVENOUS | Status: DC
  Administered 2013-12-15 – 2013-12-16 (×3): 500 mg via INTRAVENOUS
  Filled 2013-12-15 (×4): qty 500

## 2013-12-15 MED ORDER — GUAIFENESIN-DM 100-10 MG/5ML PO SYRP: 5.0000 mL | ORAL_SOLUTION | ORAL | Status: DC | PRN

## 2013-12-15 MED ORDER — SODIUM CHLORIDE 0.9 % IV SOLN
INTRAVENOUS | Status: DC
  Administered 2013-12-15: 18:00:00 via INTRAVENOUS

## 2013-12-15 MED ORDER — SODIUM CHLORIDE 0.9 % IV BOLUS (SEPSIS)
500.0000 mL | Freq: Once | INTRAVENOUS | Status: AC
  Administered 2013-12-15: 500 mL via INTRAVENOUS

## 2013-12-15 MED ORDER — VITAMIN C 500 MG PO TABS
500.0000 mg | ORAL_TABLET | Freq: Three times a day (TID) | ORAL | Status: DC
  Administered 2013-12-15 – 2013-12-21 (×17): 500 mg via ORAL
  Filled 2013-12-15 (×19): qty 1

## 2013-12-15 MED ORDER — PIPERACILLIN-TAZOBACTAM 3.375 G IVPB 30 MIN
3.3750 g | Freq: Once | INTRAVENOUS | Status: AC
Start: 2013-12-15 — End: 2013-12-15
  Administered 2013-12-15: 3.375 g via INTRAVENOUS
  Filled 2013-12-15: qty 50

## 2013-12-15 MED ORDER — CAMPHOR-MENTHOL 0.5-0.5 % EX LOTN
1.0000 "application " | TOPICAL_LOTION | Freq: Two times a day (BID) | CUTANEOUS | Status: DC
  Administered 2013-12-15 – 2013-12-21 (×4): 1 via TOPICAL
  Filled 2013-12-15 (×2): qty 222

## 2013-12-15 MED ORDER — ALUM & MAG HYDROXIDE-SIMETH 200-200-20 MG/5ML PO SUSP: 30.0000 mL | Freq: Four times a day (QID) | ORAL | Status: DC | PRN

## 2013-12-15 MED ORDER — ACETAMINOPHEN 325 MG PO TABS: 650.0000 mg | ORAL_TABLET | Freq: Four times a day (QID) | ORAL | Status: DC | PRN

## 2013-12-15 MED ORDER — SODIUM CHLORIDE 0.9 % IV BOLUS (SEPSIS)
1000.0000 mL | Freq: Once | INTRAVENOUS | Status: AC
Start: 2013-12-15 — End: 2013-12-15
  Administered 2013-12-15: 1000 mL via INTRAVENOUS

## 2013-12-15 MED ORDER — ACETAMINOPHEN 650 MG RE SUPP: 650.0000 mg | Freq: Four times a day (QID) | RECTAL | Status: DC | PRN

## 2013-12-15 MED ORDER — ALBUTEROL SULFATE (2.5 MG/3ML) 0.083% IN NEBU: 2.5000 mg | INHALATION_SOLUTION | RESPIRATORY_TRACT | Status: DC | PRN

## 2013-12-15 MED ORDER — CLOBETASOL PROPIONATE 0.05 % EX CREA
TOPICAL_CREAM | Freq: Every day | CUTANEOUS | Status: DC
  Administered 2013-12-15 – 2013-12-16 (×2): via TOPICAL
  Administered 2013-12-17: 1 via TOPICAL
  Administered 2013-12-18: 23:00:00 via TOPICAL
  Administered 2013-12-19 – 2013-12-20 (×2): 1 via TOPICAL
  Filled 2013-12-15: qty 60
  Filled 2013-12-15: qty 15

## 2013-12-15 MED ORDER — TOLNAFTATE 1 % EX AERP: 1.0000 "application " | INHALATION_SPRAY | Freq: Every day | CUTANEOUS | Status: DC

## 2013-12-15 MED ORDER — CALCIUM CARBONATE-VITAMIN D 500-200 MG-UNIT PO TABS
1.0000 | ORAL_TABLET | Freq: Every day | ORAL | Status: DC
  Administered 2013-12-16 – 2013-12-21 (×6): 1 via ORAL
  Filled 2013-12-15 (×7): qty 1

## 2013-12-15 NOTE — H&P (Addendum)
PATIENT DETAILS Name: Dawn [name] Age: 68 y.o. Sex: female Date of Birth: [birth year] Admit Date: 12/15/2013 [id number], [place]   CHIEF COMPLAINT:  Hypothermia  HPI: Dawn [name] is a 68 y.o. female with a Past Medical History of mental retardation, seizure disorder who lives in a group home, brought to the emergency room after she was found to have hypothermia on a routine vital sign check at the facility. Please note, this patient is completely nonverbal, and most of the history is obtained from the ED staff and also from her to caregivers were present at bedside. Apparently, patient was in usual state of health, her caregivers have not noticed any unusual events, there is no history of any headache, nausea, vomiting, diarrhea. Patient has not indicated to her caregivers about any pain. She was at the daycare center at the facility, where a routine set of vitals found her to have hypothermia, she was then transferred to the emergency room. A chest x-ray was negative for pneumonia, urinalysis negative for UTI, she was placed on a bair hugger, given IV fluids, I was then asked to admit this patient for further evaluation and treatment  ALLERGIES:   Allergies  Allergen Reactions  . Ppd [Tuberculin Purified Protein Derivative]     Per [facility]    PAST MEDICAL HISTORY: Past Medical History  Diagnosis Date  . Seizures   . Osteoporosis   . Thyroid disease     hypothyroid  . High cholesterol   . Cataracts, bilateral   . Optic atrophy   . Paraparesis     mild  . Hypothyroidism   . Anginal pain     PAST SURGICAL HISTORY: Past Surgical History  Procedure Laterality Date  . Midline incision      MEDICATIONS AT HOME: Prior to Admission medications   Medication Sig Start Date End Date Taking? Authorizing Provider  atorvastatin (LIPITOR) 10 MG tablet Take 10 mg by mouth daily.   Yes Historical Provider, MD  calcium-vitamin D (OYSTER CALCIUM 500 + D) 500-200 MG-UNIT  per tablet Take 1 tablet by mouth daily.   Yes Historical Provider, MD  camphor-menthol Timoteo Ace) lotion Apply 1 application topically 2 (two) times daily.   Yes Historical Provider, MD  clobetasol (TEMOVATE) 0.05 % external solution Apply 1 application topically at bedtime.    Yes Historical Provider, MD  divalproex (DEPAKOTE) 250 MG DR tablet Take 750 mg by mouth 3 (three) times daily.    Yes Historical Provider, MD  levETIRAcetam (KEPPRA) 750 MG tablet Take 750 mg by mouth 3 (three) times daily.    Yes Historical Provider, MD  levothyroxine (SYNTHROID, LEVOTHROID) 75 MCG tablet Take 75 mcg by mouth daily.   Yes Historical Provider, MD  LORazepam (ATIVAN) 2 MG tablet Take 2 mg by mouth See admin instructions. Take 2mg  1hour prior to dental procedure   Yes Historical Provider, MD  nitrofurantoin (MACRODANTIN) 50 MG capsule Take 50 mg by mouth at bedtime.    Yes Historical Provider, MD  polyethylene glycol (MIRALAX / GLYCOLAX) packet Take 17 g by mouth daily.   Yes Historical Provider, MD  tolnaftate (TINACTIN) 1 % spray Apply 1 application topically at bedtime.    Yes Historical Provider, MD  vitamin C (ASCORBIC ACID) 500 MG tablet Take 500 mg by mouth 3 (three) times daily.    Yes Historical Provider, MD    FAMILY HISTORY: Family history is obtainable at this time given patient's mental retardation  SOCIAL  HISTORY:  reports that she has never smoked. She has never used smokeless tobacco. She reports that she does not drink alcohol. Her drug history is not on file.  REVIEW OF SYSTEMS: Per primary caregiver was negative for the following Constitutional:   No  weight loss, night sweats,  Fevers, chills, fatigue.  HEENT:    No headaches, Difficulty swallowing,Tooth/dental problems,Sore throat,  No sneezing, itching, ear ache, nasal congestion, post nasal drip,   Cardio-vascular: No chest pain,  Orthopnea, PND, swelling in lower extremities, anasarca,dizziness, palpitations  GI:  No  heartburn, indigestion, abdominal pain, nausea, vomiting, diarrhea, change in       bowel habits, loss of appetite  Resp: No shortness of breath with exertion or at rest.  No excess mucus, no productive cough, No non-productive cough,  No coughing up of blood.No change in color of mucus.No wheezing.No chest wall deformity  Skin:  no rash or lesions.  GU:  no dysuria, change in color of urine, no urgency or frequency.  No flank pain.  Musculoskeletal: No joint pain or swelling.  No decreased range of motion.  No back pain.  Psych: No change in mood or affect. No depression or anxiety.  No memory loss.   PHYSICAL EXAM: Blood pressure 88/70, pulse 86, temperature 93.6 F (34.2 C), temperature source Rectal, resp. rate 18, SpO2 100.00%.  General appearance:Awake,non verbal- not in any distress. Her caregivers at bedside at baseline HEENT: Atraumatic and Normocephalic Neck: supple, no JVD. No cervical lymphadenopathy.  Chest:Good air entry bilaterally, no added sounds  CVS: S1 S2 regular, no murmurs.  Abdomen: Bowel sounds present, Non tender and not distended with no gaurding, rigidity or rebound. Extremities: B/L Lower Ext seems to be contracted, however no cellulitis seen in the legs. No edema seen. Neurology: Awake, nonverbal at baseline. Nonambulatory due to contractures-dependent on caregivers for 24/7 care. Skin:No Rash Wounds:N/A  LABS ON ADMISSION:   Recent Labs  12/15/13 1608  NA 146  K 5.1  CL 107  CO2 29  GLUCOSE 97  BUN 26*  CREATININE 0.62  CALCIUM 9.2    Recent Labs  12/15/13 1608  AST 56*  ALT 56*  ALKPHOS 195*  BILITOT <0.2*  PROT 7.2  ALBUMIN 2.6*   No results found for this basename: LIPASE, AMYLASE,  in the last 72 hours  Recent Labs  12/15/13 1608  WBC 5.7  NEUTROABS 3.3  HGB 9.3*  HCT 27.2*  MCV 101.1*  PLT 73*   No results found for this basename: CKTOTAL, CKMB, CKMBINDEX, TROPONINI,  in the last 72 hours No results found for  this basename: DDIMER,  in the last 72 hours No components found with this basename: POCBNP,    RADIOLOGIC STUDIES ON ADMISSION: Dg Chest Port 1 View  12/15/2013   CLINICAL DATA:  Cold exposure.  EXAM: PORTABLE CHEST - 1 VIEW  COMPARISON:  10/28/2013.  FINDINGS: The cardiac silhouette, mediastinal and hilar contours are stable. There is tortuosity and ectasia of the thoracic aorta. Low lung volumes with vascular crowding and basilar atelectasis but no infiltrates, edema or effusions. The bony thorax is grossly normal.  IMPRESSION: No acute cardiopulmonary findings.   Electronically Signed   By: Kalman Jewels M.D.   On: 12/15/2013 16:34     ASSESSMENT AND PLAN: Present on Admission:  . SIRS (systemic inflammatory response syndrome) - Likely secondary to a occult infection, no foci of infection evident, will treat with IV fluids, empiric vancomycin and Zosyn. Await cultures.  Marland Kitchen  Hypothermia - Suspect likely secondary to an infection that is not evident. - Will start antibiotics, IV fluids, supportive care with warming blanket. Check TSH and cortisol level.  . Seizure disorder - Continue Depakote and Keppra  . History of mental retardation/dysphagia/incontinence - In a group home-dependent on caregivers for 24/7 care. Her caregivers at bedside, on a pure/minced diet, we'll cautiously start a dysphagia 1 diet and see how she does.  . History of CAD - Recent non-STEMI, currently initial troponin negative.  Marland Kitchen History of diastolic dysfunction - Currently compensated, watch while on IV fluids.  .Thrombocytopenia - Appears chronic, monitor. Avoid Lovenox/heparin   Further plan will depend as patient's clinical course evolves and further radiologic and laboratory data become available. Patient will be monitored closely.  Above noted plan was discussed with patient's sister-Ms. Sellers over the phone, t she was in agreement.   DVT Prophylaxis: SCD's  Code Status: Full Code- confirmed  with sister. Poor quality of life, poor overall prognosis. Sister at this time wants aggressive care.  Total time spent for admission equals 45 minutes.  [facility] Pager [phone]  If 7PM-7AM, please contact night-coverage [email or web address] Password [email or web address] 12/15/2013, 6:20 PM

## 2013-12-15 NOTE — ED Provider Notes (Signed)
  Face-to-face evaluation   History: Patient with presentation for evaluation of hypothermia. Otherwise, asymptomatic. She cannot contribute a history. She is here with caregivers from the group home, where she lives  Physical exam: Appears older than stated age. Heart regular rate and rhythm. No murmur. Lungs clear anteriorly and posteriorly.  Medical screening examination/treatment/procedure(s) were conducted as a shared visit with non-physician practitioner(s) and myself.  I personally evaluated the patient during the encounter  Richarda Blade, MD 12/15/13 2039

## 2013-12-15 NOTE — Progress Notes (Signed)
Hypoglycemic Event  CBG: 47  Treatment: 15 GM carbohydrate snack  Symptoms: None  Follow-up CBG: Time:2159 CBG Result:114  Possible Reasons for Event: Inadequate meal intake  Comments/MD notified: Schorr, NP notified.    Dawn [name]  Remember to initiate Hypoglycemia Order Set & complete

## 2013-12-15 NOTE — ED Notes (Signed)
Pt arrived per EMS from [place] day care sent related to temperature of 85 tympanic. Denies any other complaints. Pt at baseline. Tympanic temperature for  EMS 97.3

## 2013-12-15 NOTE — ED Notes (Signed)
Pt place on bear hugger related to temperature.

## 2013-12-15 NOTE — Progress Notes (Signed)
ANTIBIOTIC CONSULT NOTE - INITIAL  Pharmacy Consult for vancomycin and Zosyn Indication: rule out sepsis  Allergies  Allergen Reactions  . Ppd [Tuberculin Purified Protein Derivative]     [facility]    Patient Measurements: Height: 5\' 4"  (162.6 cm) Weight: 128 lb (58.06 kg) IBW/kg (Calculated) : 54.7  Vital Signs: Temp: 93.6 F (34.2 C) (03/03 1726) Temp src: Rectal (03/03 1535) BP: 88/70 mmHg (03/03 1715) Pulse Rate: 86 (03/03 1715) Intake/Output from previous day:   Intake/Output from this shift: Total I/O In: 1000 [I.V.:1000] Out: -   Labs:  Recent Labs  12/15/13 1608  WBC 5.7  HGB 9.3*  PLT 73*  CREATININE 0.62   Estimated Creatinine Clearance: 58.9 ml/min (by C-G formula based on Cr of 0.62). No results found for this basename: VANCOTROUGH, VANCOPEAK, VANCORANDOM, GENTTROUGH, GENTPEAK, GENTRANDOM, TOBRATROUGH, TOBRAPEAK, TOBRARND, AMIKACINPEAK, AMIKACINTROU, AMIKACIN,  in the last 72 hours   Microbiology: No results found for this or any previous visit (from the past 720 hour(s)).  Medical History: Past Medical History  Diagnosis Date  . Seizures   . Osteoporosis   . Thyroid disease     hypothyroid  . High cholesterol   . Cataracts, bilateral   . Optic atrophy   . Paraparesis     mild  . Hypothyroidism   . Anginal pain     Medications:  Infusions:  . sodium chloride 125 mL/hr at 12/15/13 1626  . sodium chloride 100 mL/hr at 12/15/13 1823  . piperacillin-tazobactam    . vancomycin     Assessment: 68 yo AAF (with CP, seizure disorder, non-verbal, partial blindness, and paraparesis) presents to ED from [place] d/t changes in behavior and recent decrease in fluid intake over past 2 days.  Patient found to be in new onset afib w/RVR, hypothermic, and hypotensive. Pharmacy consulted to dose vancomycin and Zosyn for empiric therapy.  Initial labs reveal WBC wnl and SCr 0.62 with estimated CrCl ~59.    Goal of Therapy:  Vancomycin trough level  15-20 mcg/ml Eradication of possible infection  Plan:  - give Vancomycin IV 1gm, followed by 500mg  q12h - draw VT when appropriate, consider earlier level in cerebral palsy patient - start Zosyn 3.375gm q8h (4 hour extended infusion) - monitor kidney function, WBC, temperature curve, and clinical progression  Ovid Curd E. Jacqlyn Larsen, PharmD Clinical Pharmacist - Resident Pager: [phone] Pharmacy: [phone] 12/15/2013 7:18 PM

## 2013-12-15 NOTE — ED Notes (Signed)
Pt's CBG is 90 mg/dl. RN notified.

## 2013-12-15 NOTE — ED Provider Notes (Signed)
CSN: [id number]     Arrival date & time 12/15/13  1515 History   First MD Initiated Contact with Patient 12/15/13 1516     Chief Complaint  Patient presents with  . Cold Exposure     (Consider location/radiation/quality/duration/timing/severity/associated sxs/prior Treatment) HPI Level 5 caveat- pt is nonverbal, CP, MR CODE STATUS: Full Code as of 10/2013  Patient to the ER, 68 -year-old female with developmental disability and seizure disorder.from [facility] day care for hypothermic temperature. She has CP, seizure disorder, is nonverbal with partial blindness, paraparesis, thyroid disease. Per day care facility she was acting completely normal and ate well today. They found the BP of 85 Tympanic incidentally during her discharge vitals.    Brought to the emergency department from her group home because of changes in behavior which were not typical for her. Patient is normally nonverbal and visually impaired so has very limited communication. Initially it was felt the patient may have had seizure activity but she displayed no typical postictal phase. Upon arrival to the emergency department she was found to be in atrial fibrillation with RVR which would be a new problem for her. The staff did note that the patient had not been drinking enough fluids over the past one to 2 days. She not go had any other constitutional symptoms or any signs of GI bleeding. In addition to the atrial fibrillation with RVR the patient has a subtle elevation in troponin.      Past Medical History  Diagnosis Date  . Seizures   . Osteoporosis   . Thyroid disease     hypothyroid  . High cholesterol   . Cataracts, bilateral   . Optic atrophy   . Paraparesis     mild  . Hypothyroidism   . Anginal pain    Past Surgical History  Procedure Laterality Date  . Midline incision     History reviewed. No pertinent family history. History  Substance Use Topics  . Smoking status: Never Smoker   . Smokeless tobacco:  Never Used  . Alcohol Use: No   OB History   Grav Para Term Preterm Abortions TAB SAB Ect Mult Living   0              Review of Systems  Level 5 caveat- pt is nonverbal, CP, MR  Allergies  Ppd  Home Medications   Current Outpatient Rx  Name  Route  Sig  Dispense  Refill  . atorvastatin (LIPITOR) 10 MG tablet   Oral   Take 10 mg by mouth daily.         . calcium-vitamin D (OYSTER CALCIUM 500 + D) 500-200 MG-UNIT per tablet   Oral   Take 1 tablet by mouth daily.         . camphor-menthol (SARNA) lotion   Topical   Apply 1 application topically 2 (two) times daily.         . carvedilol (COREG) 3.125 MG tablet   Oral   Take 1 tablet (3.125 mg total) by mouth 2 (two) times daily with a meal.   60 tablet   0   . clobetasol (TEMOVATE) 0.05 % external solution   Topical   Apply 1 application topically at bedtime.          . divalproex (DEPAKOTE) 250 MG DR tablet   Oral   Take 750 mg by mouth 3 (three) times daily.          Marland Kitchen levETIRAcetam (KEPPRA) 750 MG tablet  Oral   Take 750 mg by mouth 3 (three) times daily.          Marland Kitchen levothyroxine (SYNTHROID, LEVOTHROID) 75 MCG tablet   Oral   Take 75 mcg by mouth daily.         . nitrofurantoin (MACRODANTIN) 50 MG capsule   Oral   Take 50 mg by mouth at bedtime.          Marland Kitchen tolnaftate (TINACTIN) 1 % spray   Topical   Apply 1 application topically at bedtime.          . vitamin C (ASCORBIC ACID) 500 MG tablet   Oral   Take 500 mg by mouth 3 (three) times daily.           BP 88/70  Pulse 86  Temp(Src) 93.6 F (34.2 C) (Rectal)  Resp 18  SpO2 100% Physical Exam  Nursing note and vitals reviewed. Constitutional: She appears well-developed and well-nourished. She appears distressed (hypothermic).  HENT:  Head: Normocephalic and atraumatic.  Eyes: Pupils are equal, round, and reactive to light.  Pt keeps her eyes closed and will not allow me to open them to examine (she squints)  Neck:  Normal range of motion. Neck supple.  Cardiovascular: Normal rate and regular rhythm.   Pulmonary/Chest: Effort normal.  Abdominal: Soft. There is no tenderness. There is no guarding.  Musculoskeletal:  Good pulses to bilateral lower extremities, no color change or ulcerations noted.  Neurological: She is alert.  Unable to evaluate neurologic status Pt nonverbal and MR, does not follow commands and baseline  Skin: Skin is warm and dry.    ED Course  Procedures (including critical care time) Labs Review Labs Reviewed  URINALYSIS, ROUTINE W REFLEX MICROSCOPIC - Abnormal; Notable for the following:    Ketones, ur 15 (*)    All other components within normal limits  CBC WITH DIFFERENTIAL - Abnormal; Notable for the following:    RBC 2.69 (*)    Hemoglobin 9.3 (*)    HCT 27.2 (*)    MCV 101.1 (*)    MCH 34.6 (*)    RDW 15.9 (*)    Platelets 73 (*)    All other components within normal limits  BASIC METABOLIC PANEL - Abnormal; Notable for the following:    BUN 26 (*)    All other components within normal limits  LACTIC ACID, PLASMA - Abnormal; Notable for the following:    Lactic Acid, Venous 2.5 (*)    All other components within normal limits  CULTURE, BLOOD (ROUTINE X 2)  CULTURE, BLOOD (ROUTINE X 2)  OCCULT BLOOD X 1 CARD TO LAB, STOOL  HEPATIC FUNCTION PANEL  I-STAT TROPOININ, ED  CBG MONITORING, ED  TYPE AND SCREEN  ABO/RH   Imaging Review Dg Chest Port 1 View  12/15/2013   CLINICAL DATA:  Cold exposure.  EXAM: PORTABLE CHEST - 1 VIEW  COMPARISON:  10/28/2013.  FINDINGS: The cardiac silhouette, mediastinal and hilar contours are stable. There is tortuosity and ectasia of the thoracic aorta. Low lung volumes with vascular crowding and basilar atelectasis but no infiltrates, edema or effusions. The bony thorax is grossly normal.  IMPRESSION: No acute cardiopulmonary findings.   Electronically Signed   By: Kalman Jewels M.D.   On: 12/15/2013 16:34     EKG  Interpretation None      MDM   Final diagnoses:  Hypothermia  Hypotension   CRITICAL CARE Performed by: Linus Mako Total critical care time: 35  Critical care time was exclusive of separately billable procedures and treating other patients. Critical care was necessary to treat or prevent imminent or life-threatening deterioration. Critical care was time spent personally by me on the following activities: development of treatment plan with patient and/or surrogate as well as nursing, discussions with consultants, evaluation of patient's response to treatment, examination of patient, obtaining history from patient or surrogate, ordering and performing treatments and interventions, ordering and review of laboratory studies, ordering and review of radiographic studies, pulse oximetry and re-evaluation of patient's condition.  Patients blood pressure continues to drop and is now 88/56, she is receiving fluid boluses. She also continues to be hypthermic at 93 despite the warmer.  Unable to find source of infection. Urinalysis and chest xray show no infection, labs are baseline for patient.  [place], stepdown, [place], [address]    Linus Mako, PA-C 12/15/13 1748  Linus Mako, PA-C 12/15/13 1749

## 2013-12-15 NOTE — Progress Notes (Signed)
Pt admitted to ED with no belongings. Pt is non verbal and unable to follow commands. Group Home Manager at bedside and confirms that this is her baseline. T 100.8 (R) BP 75/36 H 97 RR 19 SpO2 100% on room air. Schorr, NP notified about BP. New orders received for 500 ml NS bolus. Will carry out orders and continue to monitor.

## 2013-12-16 LAB — GLUCOSE, CAPILLARY
GLUCOSE-CAPILLARY: 108 mg/dL — AB (ref 70–99)
GLUCOSE-CAPILLARY: 116 mg/dL — AB (ref 70–99)
GLUCOSE-CAPILLARY: 131 mg/dL — AB (ref 70–99)
GLUCOSE-CAPILLARY: 87 mg/dL (ref 70–99)
Glucose-Capillary: 93 mg/dL (ref 70–99)
Glucose-Capillary: 89 mg/dL (ref 70–99)
Glucose-Capillary: 85 mg/dL (ref 70–99)
Glucose-Capillary: 85 mg/dL (ref 70–99)
Glucose-Capillary: 138 mg/dL — ABNORMAL HIGH (ref 70–99)
Glucose-Capillary: 110 mg/dL — ABNORMAL HIGH (ref 70–99)

## 2013-12-16 LAB — BASIC METABOLIC PANEL
BUN: 21 mg/dL (ref 6–23)
CHLORIDE: 109 meq/L (ref 96–112)
CO2: 25 mEq/L (ref 19–32)
Calcium: 8.5 mg/dL (ref 8.4–10.5)
Creatinine, Ser: 0.71 mg/dL (ref 0.50–1.10)
GFR calc non Af Amer: 87 mL/min — ABNORMAL LOW (ref >90)
Glucose, Bld: 88 mg/dL (ref 70–99)
POTASSIUM: 5.6 meq/L — AB (ref 3.7–5.3)
SODIUM: 142 meq/L (ref 137–147)

## 2013-12-16 LAB — TSH: TSH: 2.495 u[IU]/mL (ref 0.350–4.500)

## 2013-12-16 LAB — CBC
HEMATOCRIT: 27.7 % — AB (ref 36.0–46.0)
HEMOGLOBIN: 9.2 g/dL — AB (ref 12.0–15.0)
MCH: 33 pg (ref 26.0–34.0)
MCHC: 33.2 g/dL (ref 30.0–36.0)
MCV: 99.3 fL (ref 78.0–100.0)
Platelets: 92 10*3/uL — ABNORMAL LOW (ref 150–400)
RBC: 2.79 MIL/uL — ABNORMAL LOW (ref 3.87–5.11)
RDW: 15.8 % — ABNORMAL HIGH (ref 11.5–15.5)
WBC: 6.5 10*3/uL (ref 4.0–10.5)

## 2013-12-16 LAB — CORTISOL: CORTISOL PLASMA: 13.5 ug/dL

## 2013-12-16 MED ORDER — ENSURE PUDDING PO PUDG
1.0000 | Freq: Three times a day (TID) | ORAL | Status: DC
  Administered 2013-12-16 – 2013-12-21 (×14): 1 via ORAL

## 2013-12-16 NOTE — Progress Notes (Signed)
Clinical Social Work Department BRIEF PSYCHOSOCIAL ASSESSMENT 12/16/2013  Patient:  Dawn [name], Dawn [name]     Account Number:  [id number]     Admit date:  12/15/2013  Clinical Social Worker:  Su Monks  Date/Time:  12/16/2013 02:42 PM  Referred by:  RN  Date Referred:  12/16/2013 Referred for  Other - See comment   Other Referral:   Patient from [place] type:  Family Other interview type:   CSW spoke with patient's sister Dawn [name] [phone] via telephone    PSYCHOSOCIAL DATA Living Status:  FACILITY Admitted from facility:  [facility] Level of care:  Group Home Primary support name:  [name] [phone] Primary support relationship to patient:  [place] Degree of support available:   Adequate    CURRENT CONCERNS Current Concerns  Post-Acute Placement   Other Concerns:    SOCIAL WORK ASSESSMENT / PLAN CSW informed by RN that patient from [facility] in [place]. Patient is developmentally delayed and is nonverbal and not oriented x4 per medical chart. CSW contacted sister [name] via telephone to complete assessment. CSW introduced self and explained CSW role, sister agreeable to speaking. Sister reports that patient has been living at group home for over 10 years. Sister does not have any concerns with the group home and reports that the faciity takes appropriate care of patient. Sister states that plan is for patient to return to group home. Sister inquired about medical status of patient- CSW provided sister with unit telephone number to speak with RN.    CSW left voicemail for group home administrator [phone] regarding patient return. Awaiting call back.   Assessment/plan status:  Psychosocial Support/Ongoing Assessment of Needs Other assessment/ plan:   CSW to follow for support and to facilitate transition back to group home when medically stable.   Information/referral to community resources:     PATIENT'S/FAMILY'S RESPONSE TO PLAN OF CARE: Patient's sister Dawn [name] states that her sister will be returning to [facility] at discharge. She reports that the group home "takes good care" of patient. Sister would like to be updated on patient's care/discharge planning.     Tilden Fossa, MSW, [place] Social Worker [phone]

## 2013-12-16 NOTE — Progress Notes (Signed)
Roxane, RN at group home where patient is from was called at [phone] and updated on patients new location and current status.

## 2013-12-16 NOTE — Progress Notes (Signed)
INITIAL NUTRITION ASSESSMENT  DOCUMENTATION CODES Per approved criteria  -Not Applicable   INTERVENTION: Add Ensure Pudding po TID, each supplement provides 170 kcal and 4 grams of protein. May provide with meds or between meals. RD to continue to follow nutrition care plan.  NUTRITION DIAGNOSIS: Increased nutrient needs related to acute illness as evidenced by estimated needs.   Goal: Intake to meet >90% of estimated nutrition needs.  Monitor:  weight trends, lab trends, I/O's, PO intake, supplement tolerance  Reason for Assessment: Malnutrition Screening Tool  68 y.o. female  Admitting Dx: SIRS  ASSESSMENT: PMHx significant for mental retardation, seizure disorder, from group home. Admitted with hypothermia. Work-up reveals SIRS.  Pt with hypoglycemic episode yesterday afternoon. Group home manager reported on admission that pt has normal appetite. Pt noted to have poor quality of life and poor overall prognosis, however sister wants aggressive care.  Currently ordered for Dysphagia 1 (pureed) diet with thin liquids. Pt requires full assistance with feeding. RN fed pt this morning, ate a majority of her foods. Did not eat eggs, but was able to eat oatmeal and magic cup.  Potassium is elevated at 5.6.  Height: Ht Readings from Last 1 Encounters:  12/15/13 5\' 3"  (1.6 m)    Weight: Wt Readings from Last 1 Encounters:  12/16/13 125 lb 10.6 oz (57 kg)    Ideal Body Weight: 115 lb  % Ideal Body Weight: 109%  Wt Readings from Last 10 Encounters:  12/16/13 125 lb 10.6 oz (57 kg)  11/01/13 137 lb 2 oz (62.2 kg)  02/12/12 116 lb (52.617 kg)    Usual Body Weight: n/a  % Usual Body Weight: n/a  BMI:  Body mass index is 22.27 kg/(m^2). Normal weight  Estimated Nutritional Needs: Kcal: 1500 - 1650 Protein: 60 - 70 grams Fluid: approx 1.8 liters daily  Skin: R heel blister  Diet Order: Dysphagia 1; thin liquids  EDUCATION NEEDS: -No education needs identified  at this time   Intake/Output Summary (Last 24 hours) at 12/16/13 1024 Last data filed at 12/16/13 0826  Gross per 24 hour  Intake 2919.17 ml  Output      0 ml  Net 2919.17 ml    Last BM: PTA  Labs:   Recent Labs Lab 12/15/13 1608 12/16/13 0240  NA 146 142  K 5.1 5.6*  CL 107 109  CO2 29 25  BUN 26* 21  CREATININE 0.62 0.71  CALCIUM 9.2 8.5  GLUCOSE 97 88    CBG (last 3)   Recent Labs  12/16/13 0410 12/16/13 0558 12/16/13 0801  GLUCAP 89 85 85    Scheduled Meds: . atorvastatin  10 mg Oral QHS  . calcium-vitamin D  1 tablet Oral Q breakfast  . camphor-menthol  1 application Topical BID  . clobetasol cream   Topical QHS  . clotrimazole   Topical BID  . divalproex  750 mg Oral TID  . levETIRAcetam  750 mg Oral TID  . levothyroxine  75 mcg Oral QAC breakfast  . piperacillin-tazobactam (ZOSYN)  IV  3.375 g Intravenous Q8H  . polyethylene glycol  17 g Oral Daily  . vancomycin  500 mg Intravenous Q12H  . vancomycin  1,000 mg Intravenous Once  . vitamin C  500 mg Oral TID    Continuous Infusions: . dextrose 5 % and 0.9% NaCl 125 mL/hr at 12/16/13 8299    Past Medical History  Diagnosis Date  . Seizures   . Osteoporosis   . Thyroid disease  hypothyroid  . High cholesterol   . Cataracts, bilateral   . Optic atrophy   . Paraparesis     mild  . Hypothyroidism   . Anginal pain     Past Surgical History  Procedure Laterality Date  . Midline incision      Inda Coke MS, RD, LDN Inpatient Registered Dietitian Pager: [phone] After-hours pager: [phone]

## 2013-12-16 NOTE — Progress Notes (Signed)
Utilization review completed.  

## 2013-12-16 NOTE — Progress Notes (Signed)
[facility]  SHELL BLANCHETTE KVQ:[id number] DOB: 1946/10/15 DOA: 12/15/2013 PCP: [name]  Brief narrative: 68 year old female with known developmental disability who is nonverbal and lives in a group home. She has a seizure disorder. Brought to the emergency department after being determined to be hypothermic on routine vital sign check. No apparent recent changes in status to explain the hypothermia. In the ER chest x-ray was unremarkable, urinalysis was negative for UTI. Treatment consisted of re warming with bair hugger and IV fluids.  Assessment/Plan:    Hypotension/Dehydration/ Hypothermia -Cortisol 13.7 so doubt adrenal insufficiency -Suspect patient has failure to thrive related to her underlying mental status -During previous admission suggested that facility have meeting with family to at least discuss goals of care and to determine if a MOST form was appropriate -At time of this admission The attending physician spoke with the patient's sister who confirmed the patient was to remain a FULL CODE despite poor quality of life and poor overall prognosis - sister stted she still wants aggressive care. -After hydration patient is now normotensive but until can establish eating adequately we'll continue IV fluids for at least another 24 hours    SIRS -No clear source of infection - discontinue antibiotics - blood cultures obtained at time of admission and chest x-ray negative    Hypothyroidism -TSH pending -Continue Synthroid    Mild transaminitis  -F/u in AM     Thrombocytopenia -Chronic and stable    Anemia -Chronic and stable    Seizure disorder -Continue home medications    Developmental disability -At baseline/nonverbal    HLD (hyperlipidemia)    Diastolic heart failure, NYHA class 2 -Compensated  DVT prophylaxis: SCDs  Code Status: Full  Family Communication: No family at bedside  Disposition Plan/Expected LOS:  Transfer to floor  Consultants: None  Procedures: None  Antibiotics: Zosyn 3/3 >>3/4 Vancomycin 3/3 >>3/4  HPI/Subjective: Patient awake but keeps eyes closed and is nonverbal, as per baseline.    Objective: Blood pressure 122/49, pulse 94, temperature 98.2 F (36.8 C), temperature source Oral, resp. rate 15, height 5\' 3"  (1.6 m), weight 125 lb 10.6 oz (57 kg), SpO2 98.00%.  Intake/Output Summary (Last 24 hours) at 12/16/13 1325 Last data filed at 12/16/13 1113  Gross per 24 hour  Intake 3557.09 ml  Output      0 ml  Net 3557.09 ml   Exam: General: No acute respiratory distress Lungs: Clear to auscultation bilaterally without wheezes or crackles, RA Cardiovascular: Regular rate and rhythm without murmur gallop or rub normal S1 and S2, no peripheral edema or JVD Abdomen: Nontender, nondistended, soft, bowel sounds positive, no rebound, no ascites, no appreciable mass Musculoskeletal: No significant cyanosis, clubbing of bilateral lower extremities  Scheduled Meds:  Scheduled Meds: . atorvastatin  10 mg Oral QHS  . calcium-vitamin D  1 tablet Oral Q breakfast  . camphor-menthol  1 application Topical BID  . clobetasol cream   Topical QHS  . clotrimazole   Topical BID  . divalproex  750 mg Oral TID  . feeding supplement (ENSURE)  1 Container Oral TID BM  . levETIRAcetam  750 mg Oral TID  . levothyroxine  75 mcg Oral QAC breakfast  . piperacillin-tazobactam (ZOSYN)  IV  3.375 g Intravenous Q8H  . polyethylene glycol  17 g Oral Daily  . vancomycin  500 mg Intravenous Q12H  . vancomycin  1,000 mg Intravenous Once  . vitamin C  500 mg Oral TID   Data Reviewed: Basic Metabolic Panel:  Recent Labs Lab 12/15/13 1608 12/16/13 0240  NA 146 142  K 5.1 5.6*  CL 107 109  CO2 29 25  GLUCOSE 97 88  BUN 26* 21  CREATININE 0.62 0.71  CALCIUM 9.2 8.5   Liver Function Tests:  Recent Labs Lab 12/15/13 1608  AST 56*  ALT 56*  ALKPHOS 195*  BILITOT <0.2*  PROT 7.2    ALBUMIN 2.6*   CBC:  Recent Labs Lab 12/15/13 1608 12/16/13 0240  WBC 5.7 6.5  NEUTROABS 3.3  --   HGB 9.3* 9.2*  HCT 27.2* 27.7*  MCV 101.1* 99.3  PLT 73* 92*   BNP (last 3 results)  Recent Labs  10/28/13 1547  PROBNP 488.2*   CBG:  Recent Labs Lab 12/16/13 0558 12/16/13 0801 12/16/13 1013 12/16/13 1114 12/16/13 1206  GLUCAP 85 85 116* 108* 138*    Recent Results (from the past 240 hour(s))  CULTURE, BLOOD (ROUTINE X 2)     Status: None   Collection Time    12/15/13  4:08 PM      Result Value Ref Range Status   Specimen Description BLOOD HAND LEFT   Final   Special Requests BOTTLES DRAWN AEROBIC AND ANAEROBIC 3CCBLUE 2CCRED   Final   Culture  Setup Time     Final   Value: 12/15/2013 22:32     Performed at [facility]   Culture     Final   Value:        BLOOD CULTURE RECEIVED NO GROWTH TO DATE CULTURE WILL BE HELD FOR 5 DAYS BEFORE ISSUING A FINAL NEGATIVE REPORT     Performed at [facility]   Report Status PENDING   Incomplete  CULTURE, BLOOD (ROUTINE X 2)     Status: None   Collection Time    12/15/13  4:24 PM      Result Value Ref Range Status   Specimen Description BLOOD HAND RIGHT   Final   Special Requests BOTTLES DRAWN AEROBIC AND ANAEROBIC 10CC   Final   Culture  Setup Time     Final   Value: 12/15/2013 22:32     Performed at [facility]   Culture     Final   Value:        BLOOD CULTURE RECEIVED NO GROWTH TO DATE CULTURE WILL BE HELD FOR 5 DAYS BEFORE ISSUING A FINAL NEGATIVE REPORT     Performed at [facility]   Report Status PENDING   Incomplete  MRSA PCR SCREENING     Status: None   Collection Time    12/15/13  8:58 PM      Result Value Ref Range Status   MRSA by PCR NEGATIVE  NEGATIVE Final   Comment:            The GeneXpert MRSA Assay (FDA     approved for NASAL specimens     only), is one component of a     comprehensive MRSA colonization     surveillance program. It is not     intended to  diagnose MRSA     infection nor to guide or     monitor treatment for     MRSA infections.     Studies:  Recent x-ray studies have been reviewed in detail by the Attending Physician  Time spent : 87mins     Allison Ellis, [place] [facility] Office  [phone]  Pager [phone]  **If unable to reach the above provider after paging please contact the Flow Manager @ [phone]  On-Call/Text Page:      [email or web address]      password [email or web address]  If 7PM-7AM, please contact night-coverage [email or web address] Password [email or web address] 12/16/2013, 1:25 PM   LOS: 1 day   I have personally examined this patient and reviewed the entire database. I have reviewed the above note, made any necessary editorial changes, and agree with its content.  Cherene Altes, MD [facility]

## 2013-12-17 ENCOUNTER — Ambulatory Visit: Payer: Medicare Other

## 2013-12-17 DIAGNOSIS — R509 Fever, unspecified: Secondary | ICD-10-CM

## 2013-12-17 DIAGNOSIS — E039 Hypothyroidism, unspecified: Secondary | ICD-10-CM

## 2013-12-17 DIAGNOSIS — R609 Edema, unspecified: Secondary | ICD-10-CM

## 2013-12-17 LAB — CBC
HCT: 26.7 % — ABNORMAL LOW (ref 36.0–46.0)
HEMOGLOBIN: 8.9 g/dL — AB (ref 12.0–15.0)
MCH: 33.3 pg (ref 26.0–34.0)
MCHC: 33.3 g/dL (ref 30.0–36.0)
MCV: 100 fL (ref 78.0–100.0)
Platelets: 105 10*3/uL — ABNORMAL LOW (ref 150–400)
RBC: 2.67 MIL/uL — AB (ref 3.87–5.11)
RDW: 16.6 % — ABNORMAL HIGH (ref 11.5–15.5)
WBC: 8.6 10*3/uL (ref 4.0–10.5)

## 2013-12-17 LAB — COMPREHENSIVE METABOLIC PANEL
ALT: 42 U/L — ABNORMAL HIGH (ref 0–35)
AST: 38 U/L — ABNORMAL HIGH (ref 0–37)
Albumin: 2.3 g/dL — ABNORMAL LOW (ref 3.5–5.2)
Alkaline Phosphatase: 169 U/L — ABNORMAL HIGH (ref 39–117)
BUN: 14 mg/dL (ref 6–23)
CALCIUM: 9.2 mg/dL (ref 8.4–10.5)
CO2: 24 mEq/L (ref 19–32)
CREATININE: 0.76 mg/dL (ref 0.50–1.10)
Chloride: 110 mEq/L (ref 96–112)
GFR calc Af Amer: >90 mL/min (ref >90)
GFR, EST NON AFRICAN AMERICAN: 85 mL/min — AB (ref >90)
GLUCOSE: 86 mg/dL (ref 70–99)
Potassium: 5.4 mEq/L — ABNORMAL HIGH (ref 3.7–5.3)
Sodium: 144 mEq/L (ref 137–147)
Total Bilirubin: 0.2 mg/dL — ABNORMAL LOW (ref 0.3–1.2)
Total Protein: 6.5 g/dL (ref 6.0–8.3)

## 2013-12-17 LAB — VALPROIC ACID LEVEL: Valproic Acid Lvl: 70 ug/mL (ref 50.0–100.0)

## 2013-12-17 LAB — GLUCOSE, CAPILLARY
GLUCOSE-CAPILLARY: 83 mg/dL (ref 70–99)
GLUCOSE-CAPILLARY: 75 mg/dL (ref 70–99)
GLUCOSE-CAPILLARY: 81 mg/dL (ref 70–99)
Glucose-Capillary: 99 mg/dL (ref 70–99)

## 2013-12-17 MED ORDER — DIVALPROEX SODIUM 125 MG PO CPSP
750.0000 mg | ORAL_CAPSULE | Freq: Three times a day (TID) | ORAL | Status: DC
  Administered 2013-12-17 – 2013-12-21 (×12): 750 mg via ORAL
  Filled 2013-12-17 (×16): qty 6

## 2013-12-17 NOTE — Progress Notes (Addendum)
Progress Note  Dawn [name] UXN:[id number] DOB: [birth year] DOA: 12/15/2013 PCP: [name]  Brief narrative: 68 year old female with known developmental disability who is nonverbal and lives in a group home. She has a seizure disorder. Brought to the emergency department after being determined to be hypothermic on routine vital sign check. No apparent recent changes in status to explain the hypothermia. In the ER chest x-ray was unremarkable, urinalysis was negative for UTI. Treatment consisted of re warming with bair hugger and IV fluids.  Assessment/Plan:    Hypotension/Dehydration/ Hypothermia -Cortisol 13.7 so doubt adrenal insufficiency -Suspect patient has failure to thrive related to her underlying mental status -During previous admission suggested that facility have meeting with family to at least discuss goals of care and to determine if a MOST form was appropriate - patient's sister who confirmed the patient was to remain a FULL CODE despite poor quality of life and poor overall prognosis - sister stated she still wants aggressive care. -After hydration patient is now normotensive but until can establish eating adequately we'll continue IV fluids for at least another 24 hours    SIRS/mild fever -No clear source of infection - discontinue antibiotics - blood cultures obtained at time of admission and chest x-ray negative Mild fever- will check duplex of LE    Hypothyroidism -TSH ok -Continue Synthroid    Mild transaminitis  -resolving    Thrombocytopenia -Chronic and stable    Anemia -Chronic and stable    Seizure disorder -Continue home medications    Developmental disability -At baseline/nonverbal    HLD (hyperlipidemia)    Diastolic heart failure, NYHA class 2 -Compensated  DVT prophylaxis: SCDs  Code Status: Full  Family Communication: No family at bedside  Disposition Plan/Expected LOS: back to group home 2-3  days  Consultants: None  Procedures: None  Antibiotics: Zosyn 3/3 >>3/4 Vancomycin 3/3 >>3/4  HPI/Subjective: Patient awake but keeps eyes closed and is nonverbal, as per baseline.    Objective: Blood pressure 135/82, pulse 101, temperature 100.4 F (38 C), temperature source Oral, resp. rate 16, height 5\' 3"  (1.6 m), weight 61.5 kg (135 lb 9.3 oz), SpO2 97.00%.  Intake/Output Summary (Last 24 hours) at 12/17/13 1107 Last data filed at 12/17/13 2542  Gross per 24 hour  Intake 1750.83 ml  Output      0 ml  Net 1750.83 ml   Exam: General: No acute respiratory distress Lungs: Clear to auscultation bilaterally without wheezes or crackles, RA Cardiovascular: Regular rate and rhythm without murmur gallop or rub normal S1 and S2, no peripheral edema or JVD Abdomen: Nontender, nondistended, soft, bowel sounds positive, no rebound, no ascites, no appreciable mass Musculoskeletal: No significant cyanosis, clubbing of bilateral lower extremities  Scheduled Meds:  Scheduled Meds: . atorvastatin  10 mg Oral QHS  . calcium-vitamin D  1 tablet Oral Q breakfast  . camphor-menthol  1 application Topical BID  . clobetasol cream   Topical QHS  . clotrimazole   Topical BID  . divalproex  750 mg Oral TID  . feeding supplement (ENSURE)  1 Container Oral TID BM  . levETIRAcetam  750 mg Oral TID  . levothyroxine  75 mcg Oral QAC breakfast  . polyethylene glycol  17 g Oral Daily  . vitamin C  500 mg Oral TID   Data Reviewed: Basic Metabolic Panel:  Recent Labs Lab 12/15/13 1608 12/16/13 0240 12/17/13 0311  NA 146 142 144  K 5.1 5.6* 5.4*  CL 107 109 110  CO2 29 25 24   GLUCOSE 97 88 86  BUN 26* 21 14  CREATININE 0.62 0.71 0.76  CALCIUM 9.2 8.5 9.2   Liver Function Tests:  Recent Labs Lab 12/15/13 1608 12/17/13 0311  AST 56* 38*  ALT 56* 42*  ALKPHOS 195* 169*  BILITOT <0.2* 0.2*  PROT 7.2 6.5  ALBUMIN 2.6* 2.3*   CBC:  Recent Labs Lab 12/15/13 1608  12/16/13 0240 12/17/13 0311  WBC 5.7 6.5 8.6  NEUTROABS 3.3  --   --   HGB 9.3* 9.2* 8.9*  HCT 27.2* 27.7* 26.7*  MCV 101.1* 99.3 100.0  PLT 73* 92* 105*   BNP (last 3 results)  Recent Labs  10/28/13 1547  PROBNP 488.2*   CBG:  Recent Labs Lab 12/16/13 1114 12/16/13 1206 12/16/13 1630 12/16/13 2150 12/17/13 0637  GLUCAP 108* 138* 131* 110* 83    Recent Results (from the past 240 hour(s))  CULTURE, BLOOD (ROUTINE X 2)     Status: None   Collection Time    12/15/13  4:08 PM      Result Value Ref Range Status   Specimen Description BLOOD HAND LEFT   Final   Special Requests BOTTLES DRAWN AEROBIC AND ANAEROBIC 3CCBLUE 2CCRED   Final   Culture  Setup Time     Final   Value: 12/15/2013 22:32     Performed at [facility]   Culture     Final   Value:        BLOOD CULTURE RECEIVED NO GROWTH TO DATE CULTURE WILL BE HELD FOR 5 DAYS BEFORE ISSUING A FINAL NEGATIVE REPORT     Performed at [facility]   Report Status PENDING   Incomplete  CULTURE, BLOOD (ROUTINE X 2)     Status: None   Collection Time    12/15/13  4:24 PM      Result Value Ref Range Status   Specimen Description BLOOD HAND RIGHT   Final   Special Requests BOTTLES DRAWN AEROBIC AND ANAEROBIC 10CC   Final   Culture  Setup Time     Final   Value: 12/15/2013 22:32     Performed at [facility]   Culture     Final   Value:        BLOOD CULTURE RECEIVED NO GROWTH TO DATE CULTURE WILL BE HELD FOR 5 DAYS BEFORE ISSUING A FINAL NEGATIVE REPORT     Performed at [facility]   Report Status PENDING   Incomplete  MRSA PCR SCREENING     Status: None   Collection Time    12/15/13  8:58 PM      Result Value Ref Range Status   MRSA by PCR NEGATIVE  NEGATIVE Final   Comment:            The GeneXpert MRSA Assay (FDA     approved for NASAL specimens     only), is one component of a     comprehensive MRSA colonization     surveillance program. It is not     intended to diagnose MRSA      infection nor to guide or     monitor treatment for     MRSA infections.     Studies:  Recent x-ray studies have been reviewed in detail by the Attending Physician  Time spent : 7mins     Yanuel Tagg DO [facility] Office  [phone] Pager [phone]  **If unable to reach the above provider  after paging please contact the Flow Manager @ [phone]  On-Call/Text Page:      [email or web address]      password [email or web address]  If 7PM-7AM, please contact night-coverage [email or web address] Password [email or web address] 12/17/2013, 11:07 AM   LOS: 2 days

## 2013-12-17 NOTE — Progress Notes (Signed)
VASCULAR LAB PRELIMINARY  PRELIMINARY  PRELIMINARY  PRELIMINARY  Bilateral lower extremity venous duplex completed.    Preliminary report:  Technically difficult and limited due to patient condition. No obvious evidence of DVT, superficial thrombosis, or Baker's cyst.  Dawn [name], RVS 12/17/2013, 2:36 PM

## 2013-12-18 ENCOUNTER — Inpatient Hospital Stay (HOSPITAL_COMMUNITY): Payer: Medicare Other

## 2013-12-18 DIAGNOSIS — D72829 Elevated white blood cell count, unspecified: Secondary | ICD-10-CM

## 2013-12-18 DIAGNOSIS — E860 Dehydration: Secondary | ICD-10-CM

## 2013-12-18 LAB — URINALYSIS W MICROSCOPIC (NOT AT ARMC)
BILIRUBIN URINE: NEGATIVE
Glucose, UA: NEGATIVE mg/dL
Hgb urine dipstick: NEGATIVE
Ketones, ur: NEGATIVE mg/dL
LEUKOCYTES UA: NEGATIVE
Nitrite: NEGATIVE
PROTEIN: NEGATIVE mg/dL
Specific Gravity, Urine: 1.013 (ref 1.005–1.030)
Urobilinogen, UA: 1 mg/dL (ref 0.0–1.0)
pH: 7 (ref 5.0–8.0)

## 2013-12-18 LAB — GLUCOSE, CAPILLARY
GLUCOSE-CAPILLARY: 103 mg/dL — AB (ref 70–99)
Glucose-Capillary: 85 mg/dL (ref 70–99)
Glucose-Capillary: 136 mg/dL — ABNORMAL HIGH (ref 70–99)
Glucose-Capillary: 91 mg/dL (ref 70–99)

## 2013-12-18 LAB — CBC
HCT: 30.5 % — ABNORMAL LOW (ref 36.0–46.0)
Hemoglobin: 10.2 g/dL — ABNORMAL LOW (ref 12.0–15.0)
MCH: 33 pg (ref 26.0–34.0)
MCHC: 33.4 g/dL (ref 30.0–36.0)
MCV: 98.7 fL (ref 78.0–100.0)
PLATELETS: 119 10*3/uL — AB (ref 150–400)
RBC: 3.09 MIL/uL — AB (ref 3.87–5.11)
RDW: 16.2 % — ABNORMAL HIGH (ref 11.5–15.5)
WBC: 17.3 10*3/uL — AB (ref 4.0–10.5)

## 2013-12-18 LAB — BASIC METABOLIC PANEL
BUN: 10 mg/dL (ref 6–23)
CHLORIDE: 102 meq/L (ref 96–112)
CO2: 24 meq/L (ref 19–32)
Calcium: 9.9 mg/dL (ref 8.4–10.5)
Creatinine, Ser: 0.57 mg/dL (ref 0.50–1.10)
GFR calc Af Amer: >90 mL/min (ref >90)
GFR calc non Af Amer: >90 mL/min (ref >90)
Glucose, Bld: 67 mg/dL — ABNORMAL LOW (ref 70–99)
Potassium: 5.2 mEq/L (ref 3.7–5.3)
Sodium: 140 mEq/L (ref 137–147)

## 2013-12-18 MED ORDER — BISACODYL 10 MG RE SUPP
10.0000 mg | Freq: Once | RECTAL | Status: AC
  Administered 2013-12-18: 10 mg via RECTAL
  Filled 2013-12-18: qty 1

## 2013-12-18 MED ORDER — SODIUM CHLORIDE 0.9 % IV SOLN
INTRAVENOUS | Status: DC
  Administered 2013-12-18 – 2013-12-21 (×8): via INTRAVENOUS

## 2013-12-18 NOTE — Progress Notes (Signed)
Progress Note  Dawn [name] NTI:[id number] DOB: [birth year] DOA: 12/15/2013 PCP: [name]  Brief narrative: 68 year old female with known developmental disability who is nonverbal and lives in a group home. She has a seizure disorder. Brought to the emergency department after being determined to be hypothermic on routine vital sign check. No apparent recent changes in status to explain the hypothermia. In the ER chest x-ray was unremarkable, urinalysis was negative for UTI. Treatment consisted of re warming with bair hugger and IV fluids.  Assessment/Plan:    Hypotension/Dehydration/ Hypothermia -Cortisol 13.7 so doubt adrenal insufficiency -Suspect patient has failure to thrive related to her underlying mental status -During previous admission suggested that facility have meeting with family to at least discuss goals of care and to determine if a MOST form was appropriate - patient's sister who confirmed the patient was to remain a FULL CODE despite poor quality of life and poor overall prognosis - sister stated she still wants aggressive care.     SIRS/mild fever -No clear source of infection - discontinue antibiotics - blood cultures obtained at time of admission and chest x-ray negative Mild fever- will check duplex of LE    Hypothyroidism -TSH ok -Continue Synthroid    Mild transaminitis  -resolving    Thrombocytopenia -Chronic and stable    Anemia -Chronic and stable    Seizure disorder -Continue home medications    Developmental disability -At baseline/nonverbal    HLD (hyperlipidemia)    Diastolic heart failure, NYHA class 2 -Compensated  DVT prophylaxis: SCDs  Code Status: Full  Family Communication: called sister Dawn [name]- LM on VM Disposition Plan/Expected LOS: back to group home 2-3 days  Consultants: None  Procedures: None  Antibiotics: Zosyn 3/3 >>3/4 Vancomycin 3/3 >>3/4  HPI/Subjective: Awake- opens eyes- non-verbal     Objective: Blood pressure 142/95, pulse 94, temperature 97.9 F (36.6 C), temperature source Axillary, resp. rate 20, height 5\' 3"  (1.6 m), weight 61.5 kg (135 lb 9.3 oz), SpO2 99.00%.  Intake/Output Summary (Last 24 hours) at 12/18/13 1053 Last data filed at 12/18/13 0807  Gross per 24 hour  Intake     75 ml  Output      0 ml  Net     75 ml   Exam: General: No acute respiratory distress Lungs: Clear to auscultation bilaterally without wheezes or crackles, RA Cardiovascular: Regular rate and rhythm without murmur gallop or rub normal S1 and S2, no peripheral edema or JVD Abdomen: Nontender, nondistended, soft, bowel sounds positive, no rebound, no ascites, no appreciable mass Musculoskeletal: No significant cyanosis, clubbing of bilateral lower extremities  Scheduled Meds:  Scheduled Meds: . atorvastatin  10 mg Oral QHS  . bisacodyl  10 mg Rectal Once  . calcium-vitamin D  1 tablet Oral Q breakfast  . camphor-menthol  1 application Topical BID  . clobetasol cream   Topical QHS  . clotrimazole   Topical BID  . divalproex  750 mg Oral 3 times per day  . feeding supplement (ENSURE)  1 Container Oral TID BM  . levETIRAcetam  750 mg Oral TID  . levothyroxine  75 mcg Oral QAC breakfast  . polyethylene glycol  17 g Oral Daily  . vitamin C  500 mg Oral TID   Data Reviewed: Basic Metabolic Panel:  Recent Labs Lab 12/15/13 1608 12/16/13 0240 12/17/13 0311 12/18/13 0500  NA 146 142 144 140  K 5.1 5.6* 5.4* 5.2  CL 107 109 110 102  CO2 29  25 24 24   GLUCOSE 97 88 86 67*  BUN 26* 21 14 10   CREATININE 0.62 0.71 0.76 0.57  CALCIUM 9.2 8.5 9.2 9.9   Liver Function Tests:  Recent Labs Lab 12/15/13 1608 12/17/13 0311  AST 56* 38*  ALT 56* 42*  ALKPHOS 195* 169*  BILITOT <0.2* 0.2*  PROT 7.2 6.5  ALBUMIN 2.6* 2.3*   CBC:  Recent Labs Lab 12/15/13 1608 12/16/13 0240 12/17/13 0311 12/18/13 0500  WBC 5.7 6.5 8.6 17.3*  NEUTROABS 3.3  --   --   --   HGB 9.3* 9.2*  8.9* 10.2*  HCT 27.2* 27.7* 26.7* 30.5*  MCV 101.1* 99.3 100.0 98.7  PLT 73* 92* 105* 119*   BNP (last 3 results)  Recent Labs  10/28/13 1547  PROBNP 488.2*   CBG:  Recent Labs Lab 12/17/13 0637 12/17/13 1202 12/17/13 1638 12/17/13 2217 12/18/13 0655  GLUCAP 83 75 99 81 85    Recent Results (from the past 240 hour(s))  CULTURE, BLOOD (ROUTINE X 2)     Status: None   Collection Time    12/15/13  4:08 PM      Result Value Ref Range Status   Specimen Description BLOOD HAND LEFT   Final   Special Requests BOTTLES DRAWN AEROBIC AND ANAEROBIC 3CCBLUE 2CCRED   Final   Culture  Setup Time     Final   Value: 12/15/2013 22:32     Performed at [facility]   Culture     Final   Value:        BLOOD CULTURE RECEIVED NO GROWTH TO DATE CULTURE WILL BE HELD FOR 5 DAYS BEFORE ISSUING A FINAL NEGATIVE REPORT     Performed at [facility]   Report Status PENDING   Incomplete  CULTURE, BLOOD (ROUTINE X 2)     Status: None   Collection Time    12/15/13  4:24 PM      Result Value Ref Range Status   Specimen Description BLOOD HAND RIGHT   Final   Special Requests BOTTLES DRAWN AEROBIC AND ANAEROBIC 10CC   Final   Culture  Setup Time     Final   Value: 12/15/2013 22:32     Performed at [facility]   Culture     Final   Value:        BLOOD CULTURE RECEIVED NO GROWTH TO DATE CULTURE WILL BE HELD FOR 5 DAYS BEFORE ISSUING A FINAL NEGATIVE REPORT     Performed at [facility]   Report Status PENDING   Incomplete  MRSA PCR SCREENING     Status: None   Collection Time    12/15/13  8:58 PM      Result Value Ref Range Status   MRSA by PCR NEGATIVE  NEGATIVE Final   Comment:            The GeneXpert MRSA Assay (FDA     approved for NASAL specimens     only), is one component of a     comprehensive MRSA colonization     surveillance program. It is not     intended to diagnose MRSA     infection nor to guide or     monitor treatment for     MRSA  infections.       Time spent : 48mins     Audryana Hockenberry DO [facility]  [phone] Pager [phone]  **If unable to reach the above provider  after paging please contact the Flow Manager @ [phone]  On-Call/Text Page:      [email or web address]      password [email or web address]  If 7PM-7AM, please contact night-coverage [email or web address] Password [email or web address] 12/18/2013, 10:53 AM   LOS: 3 days

## 2013-12-18 NOTE — Social Work (Signed)
Attempted to contact faciity to update and inquire about weekend admissions but no answer- will ask weekend CSW to follow up with ALF to confirm this and advise MD.  Eduard Clos, MSW, [place]

## 2013-12-19 DIAGNOSIS — F79 Unspecified intellectual disabilities: Secondary | ICD-10-CM

## 2013-12-19 LAB — BASIC METABOLIC PANEL
BUN: 14 mg/dL (ref 6–23)
CHLORIDE: 108 meq/L (ref 96–112)
CO2: 27 mEq/L (ref 19–32)
Calcium: 9 mg/dL (ref 8.4–10.5)
Creatinine, Ser: 0.6 mg/dL (ref 0.50–1.10)
GFR calc non Af Amer: >90 mL/min (ref >90)
Glucose, Bld: 63 mg/dL — ABNORMAL LOW (ref 70–99)
POTASSIUM: 4.6 meq/L (ref 3.7–5.3)
SODIUM: 144 meq/L (ref 137–147)

## 2013-12-19 LAB — CBC WITH DIFFERENTIAL/PLATELET
Basophils Absolute: 0 10*3/uL (ref 0.0–0.1)
Basophils Relative: 0 % (ref 0–1)
Eosinophils Absolute: 0.2 10*3/uL (ref 0.0–0.7)
Eosinophils Relative: 1 % (ref 0–5)
HCT: 27.4 % — ABNORMAL LOW (ref 36.0–46.0)
HEMOGLOBIN: 9.4 g/dL — AB (ref 12.0–15.0)
LYMPHS PCT: 21 % (ref 12–46)
Lymphs Abs: 2.9 10*3/uL (ref 0.7–4.0)
MCH: 34.4 pg — ABNORMAL HIGH (ref 26.0–34.0)
MCHC: 34.3 g/dL (ref 30.0–36.0)
MCV: 100.4 fL — ABNORMAL HIGH (ref 78.0–100.0)
MONOS PCT: 9 % (ref 3–12)
Monocytes Absolute: 1.2 10*3/uL — ABNORMAL HIGH (ref 0.1–1.0)
NEUTROS ABS: 9.5 10*3/uL — AB (ref 1.7–7.7)
NEUTROS PCT: 69 % (ref 43–77)
Platelets: 110 10*3/uL — ABNORMAL LOW (ref 150–400)
RBC: 2.73 MIL/uL — ABNORMAL LOW (ref 3.87–5.11)
RDW: 16.3 % — ABNORMAL HIGH (ref 11.5–15.5)
WBC: 13.8 10*3/uL — AB (ref 4.0–10.5)

## 2013-12-19 LAB — GLUCOSE, CAPILLARY
GLUCOSE-CAPILLARY: 64 mg/dL — AB (ref 70–99)
GLUCOSE-CAPILLARY: 125 mg/dL — AB (ref 70–99)
Glucose-Capillary: 87 mg/dL (ref 70–99)
Glucose-Capillary: 101 mg/dL — ABNORMAL HIGH (ref 70–99)
Glucose-Capillary: 104 mg/dL — ABNORMAL HIGH (ref 70–99)

## 2013-12-19 NOTE — Progress Notes (Signed)
Hypoglycemic Event  CBG: 64  Treatment: 15 GM carbohydrate snack  Symptoms: None  Follow-up CBG: [id number] CBG Result:87  Possible Reasons for Event: Inadequate meal intake  Comments/MD notified:    [place]  Remember to initiate Hypoglycemia Order Set & complete

## 2013-12-19 NOTE — Progress Notes (Signed)
Progress Note  Dawn [name] QIH:[id number] DOB: [birth year] DOA: 12/15/2013 PCP: [name]  Brief narrative: 68 year old female with known developmental disability who is nonverbal and lives in a group home. She has a seizure disorder. Brought to the emergency department after being determined to be hypothermic on routine vital sign check. No apparent recent changes in status to explain the hypothermia. In the ER chest x-ray was unremarkable, urinalysis was negative for UTI. Treatment consisted of re warming with bair hugger and IV fluids.  Assessment/Plan:    Hypotension/Dehydration/ Hypothermia -Cortisol 13.7 so doubt adrenal insufficiency -Suspect patient has failure to thrive related to her underlying mental status -During previous admission suggested that facility have meeting with family to at least discuss goals of care and to determine if a MOST form was appropriate - patient's sister who confirmed the patient was to remain a FULL CODE despite poor quality of life and poor overall prognosis - sister stated she still wants aggressive care.  Leukocytosis -decreasing    SIRS/mild fever- resolved -No clear source of infection - discontinue antibiotics - blood cultures obtained at time of admission and chest x-ray negative Mild fever- will check duplex of LE    Hypothyroidism -TSH ok -Continue Synthroid    Mild transaminitis  -resolving    Thrombocytopenia -Chronic and stable    Anemia -Chronic and stable    Seizure disorder -Continue home medications    Developmental disability -At baseline/nonverbal    HLD (hyperlipidemia)    Diastolic heart failure, NYHA class 2 -Compensated  DVT prophylaxis: SCDs  Code Status: Full  Family Communication: called sister [name] Sellars- LM on VM Disposition Plan/Expected LOS: back to group home Monday  Consultants: None  Procedures: None  Antibiotics: Zosyn 3/3 >>3/4 Vancomycin 3/3  >>3/4  HPI/Subjective: Awake- opens eyes- non-verbal    Objective: Blood pressure 123/71, pulse 91, temperature 97.9 F (36.6 C), temperature source Axillary, resp. rate 18, height 5\' 3"  (1.6 m), weight 61.3 kg (135 lb 2.3 oz), SpO2 95.00%.  Intake/Output Summary (Last 24 hours) at 12/19/13 0945 Last data filed at 12/18/13 1227  Gross per 24 hour  Intake     50 ml  Output      0 ml  Net     50 ml   Exam: General: No acute respiratory distress Lungs: Clear to auscultation bilaterally without wheezes or crackles, RA Cardiovascular: Regular rate and rhythm without murmur gallop or rub normal S1 and S2, no peripheral edema or JVD Abdomen: Nontender, nondistended, soft, bowel sounds positive, no rebound, no ascites, no appreciable mass Musculoskeletal: No significant cyanosis, clubbing of bilateral lower extremities  Scheduled Meds:  Scheduled Meds: . atorvastatin  10 mg Oral QHS  . calcium-vitamin D  1 tablet Oral Q breakfast  . camphor-menthol  1 application Topical BID  . clobetasol cream   Topical QHS  . clotrimazole   Topical BID  . divalproex  750 mg Oral 3 times per day  . feeding supplement (ENSURE)  1 Container Oral TID BM  . levETIRAcetam  750 mg Oral TID  . levothyroxine  75 mcg Oral QAC breakfast  . polyethylene glycol  17 g Oral Daily  . vitamin C  500 mg Oral TID   Data Reviewed: Basic Metabolic Panel:  Recent Labs Lab 12/15/13 1608 12/16/13 0240 12/17/13 0311 12/18/13 0500 12/19/13 0510  NA 146 142 144 140 144  K 5.1 5.6* 5.4* 5.2 4.6  CL 107 109 110 102 108  CO2 29 25  24 24 27   GLUCOSE 97 88 86 67* 63*  BUN 26* 21 14 10 14   CREATININE 0.62 0.71 0.76 0.57 0.60  CALCIUM 9.2 8.5 9.2 9.9 9.0   Liver Function Tests:  Recent Labs Lab 12/15/13 1608 12/17/13 0311  AST 56* 38*  ALT 56* 42*  ALKPHOS 195* 169*  BILITOT <0.2* 0.2*  PROT 7.2 6.5  ALBUMIN 2.6* 2.3*   CBC:  Recent Labs Lab 12/15/13 1608 12/16/13 0240 12/17/13 0311 12/18/13 0500  12/19/13 0510  WBC 5.7 6.5 8.6 17.3* 13.8*  NEUTROABS 3.3  --   --   --  9.5*  HGB 9.3* 9.2* 8.9* 10.2* 9.4*  HCT 27.2* 27.7* 26.7* 30.5* 27.4*  MCV 101.1* 99.3 100.0 98.7 100.4*  PLT 73* 92* 105* 119* 110*   BNP (last 3 results)  Recent Labs  10/28/13 1547  PROBNP 488.2*   CBG:  Recent Labs Lab 12/18/13 1152 12/18/13 1605 12/18/13 2232 12/19/13 0625 12/19/13 0655  GLUCAP 103* 136* 91 64* 87    Recent Results (from the past 240 hour(s))  CULTURE, BLOOD (ROUTINE X 2)     Status: None   Collection Time    12/15/13  4:08 PM      Result Value Ref Range Status   Specimen Description BLOOD HAND LEFT   Final   Special Requests BOTTLES DRAWN AEROBIC AND ANAEROBIC 3CCBLUE 2CCRED   Final   Culture  Setup Time     Final   Value: 12/15/2013 22:32     Performed at [facility]   Culture     Final   Value:        BLOOD CULTURE RECEIVED NO GROWTH TO DATE CULTURE WILL BE HELD FOR 5 DAYS BEFORE ISSUING A FINAL NEGATIVE REPORT     Performed at [facility]   Report Status PENDING   Incomplete  CULTURE, BLOOD (ROUTINE X 2)     Status: None   Collection Time    12/15/13  4:24 PM      Result Value Ref Range Status   Specimen Description BLOOD HAND RIGHT   Final   Special Requests BOTTLES DRAWN AEROBIC AND ANAEROBIC 10CC   Final   Culture  Setup Time     Final   Value: 12/15/2013 22:32     Performed at [facility]   Culture     Final   Value:        BLOOD CULTURE RECEIVED NO GROWTH TO DATE CULTURE WILL BE HELD FOR 5 DAYS BEFORE ISSUING A FINAL NEGATIVE REPORT     Performed at [facility]   Report Status PENDING   Incomplete  MRSA PCR SCREENING     Status: None   Collection Time    12/15/13  8:58 PM      Result Value Ref Range Status   MRSA by PCR NEGATIVE  NEGATIVE Final   Comment:            The GeneXpert MRSA Assay (FDA     approved for NASAL specimens     only), is one component of a     comprehensive MRSA colonization     surveillance  program. It is not     intended to diagnose MRSA     infection nor to guide or     monitor treatment for     MRSA infections.       Time spent : 27mins     Ilan Kahrs DO [facility] Office  [phone] Pager [phone]  **If unable to reach the above provider after paging please contact the Flow Manager @ [phone]  On-Call/Text Page:      [email or web address]      password [email or web address]  If 7PM-7AM, please contact night-coverage [email or web address] Password [email or web address] 12/19/2013, 9:45 AM   LOS: 4 days

## 2013-12-19 NOTE — Clinical Social Work Note (Signed)
Clinical Education officer, museum (CSW) continues to assess for D/C plan. CSW spoke with [facility] nurse administrator, Heath Gold [phone]) who made CSW aware of facility not accepting residents on the weekends due to difficulty with obtaining patient's medication. Nurse administrator further stated facility receives patients Monday through Friday. CSW to continue to be available as needs arise.    [place], [place] Weekend Clinical Social Worker [phone]

## 2013-12-20 DIAGNOSIS — D649 Anemia, unspecified: Secondary | ICD-10-CM

## 2013-12-20 LAB — GLUCOSE, CAPILLARY
GLUCOSE-CAPILLARY: 72 mg/dL (ref 70–99)
GLUCOSE-CAPILLARY: 105 mg/dL — AB (ref 70–99)
GLUCOSE-CAPILLARY: 97 mg/dL (ref 70–99)
Glucose-Capillary: 71 mg/dL (ref 70–99)

## 2013-12-20 LAB — BASIC METABOLIC PANEL
BUN: 15 mg/dL (ref 6–23)
CALCIUM: 8.7 mg/dL (ref 8.4–10.5)
CO2: 26 meq/L (ref 19–32)
CREATININE: 0.53 mg/dL (ref 0.50–1.10)
Chloride: 105 mEq/L (ref 96–112)
GFR calc Af Amer: >90 mL/min (ref >90)
GLUCOSE: 72 mg/dL (ref 70–99)
Potassium: 4.3 mEq/L (ref 3.7–5.3)
SODIUM: 140 meq/L (ref 137–147)

## 2013-12-20 LAB — CBC
HCT: 25.2 % — ABNORMAL LOW (ref 36.0–46.0)
HEMOGLOBIN: 8.4 g/dL — AB (ref 12.0–15.0)
MCH: 33.5 pg (ref 26.0–34.0)
MCHC: 33.3 g/dL (ref 30.0–36.0)
MCV: 100.4 fL — AB (ref 78.0–100.0)
Platelets: 104 10*3/uL — ABNORMAL LOW (ref 150–400)
RBC: 2.51 MIL/uL — ABNORMAL LOW (ref 3.87–5.11)
RDW: 16.3 % — ABNORMAL HIGH (ref 11.5–15.5)
WBC: 10.9 10*3/uL — ABNORMAL HIGH (ref 4.0–10.5)

## 2013-12-20 MED ORDER — FOLIC ACID 1 MG PO TABS
1.0000 mg | ORAL_TABLET | Freq: Every day | ORAL | Status: DC
  Administered 2013-12-20 – 2013-12-21 (×2): 1 mg via ORAL
  Filled 2013-12-20 (×2): qty 1

## 2013-12-20 MED ORDER — SODIUM CHLORIDE 0.9 % IV BOLUS (SEPSIS)
500.0000 mL | Freq: Once | INTRAVENOUS | Status: AC
  Administered 2013-12-20: 500 mL via INTRAVENOUS

## 2013-12-20 NOTE — Progress Notes (Signed)
Progress Note  Dawn [name] CZY:[id number] DOB: October 03, 1946 DOA: 12/15/2013 PCP: [name]  Brief narrative: 68 year old female with known developmental disability who is nonverbal and lives in a group home. She has a seizure disorder. Brought to the emergency department after being determined to be hypothermic on routine vital sign check. No apparent recent changes in status to explain the hypothermia. In the ER chest x-ray was unremarkable, urinalysis was negative for UTI. Treatment consisted of re warming with bair hugger and IV fluids.  Assessment/Plan:    Hypotension/Dehydration/ Hypothermia -Cortisol 13.7 so doubt adrenal insufficiency -Suspect patient has failure to thrive related to her underlying mental status -During previous admission suggested that facility have meeting with family to at least discuss goals of care and to determine if a MOST form was appropriate - patient's sister who confirmed the patient was to remain a FULL CODE despite poor quality of life and poor overall prognosis - sister stated she still wants aggressive care.  Leukocytosis -decreasing -?etiology    SIRS/mild fever- resolved -No clear source of infection - discontinue antibiotics - blood cultures obtained at time of admission and chest x-ray negative Mild fever- will check duplex of LE    Hypothyroidism -TSH ok -Continue Synthroid    Mild transaminitis  -resolving    Thrombocytopenia -Chronic and stable    Anemia -Chronic and stable -will add folic acid as on depakote    Seizure disorder -Continue home medications    Developmental disability -At baseline/nonverbal    HLD (hyperlipidemia)    Diastolic heart failure, NYHA class 2 -Compensated  DVT prophylaxis: SCDs  Code Status: Full  Family Communication: spoke with sister, several days ago Disposition Plan/Expected LOS: back to group home Monday  Consultants: None  Procedures: None  Antibiotics: Zosyn 3/3  >>3/4 Vancomycin 3/3 >>3/4  HPI/Subjective: Awake- opens eyes- non-verbal   Eating > 50% of meals  Objective: Blood pressure 119/44, pulse 82, temperature 97.5 F (36.4 C), temperature source Oral, resp. rate 18, height 5\' 3"  (1.6 m), weight 61.5 kg (135 lb 9.3 oz), SpO2 98.00%.  Intake/Output Summary (Last 24 hours) at 12/20/13 1100 Last data filed at 12/19/13 1800  Gross per 24 hour  Intake   1860 ml  Output      0 ml  Net   1860 ml   Exam: General: No acute respiratory distress Lungs: Clear to auscultation bilaterally without wheezes or crackles, RA Cardiovascular: Regular rate and rhythm without murmur gallop or rub normal S1 and S2, no peripheral edema or JVD Abdomen: Nontender, nondistended, soft, bowel sounds positive, no rebound, no ascites, no appreciable mass Musculoskeletal: No significant cyanosis, clubbing of bilateral lower extremities  Scheduled Meds:  Scheduled Meds: . atorvastatin  10 mg Oral QHS  . calcium-vitamin D  1 tablet Oral Q breakfast  . camphor-menthol  1 application Topical BID  . clobetasol cream   Topical QHS  . clotrimazole   Topical BID  . divalproex  750 mg Oral 3 times per day  . feeding supplement (ENSURE)  1 Container Oral TID BM  . levETIRAcetam  750 mg Oral TID  . levothyroxine  75 mcg Oral QAC breakfast  . polyethylene glycol  17 g Oral Daily  . vitamin C  500 mg Oral TID   Data Reviewed: Basic Metabolic Panel:  Recent Labs Lab 12/16/13 0240 12/17/13 0311 12/18/13 0500 12/19/13 0510 12/20/13 0705  NA 142 144 140 144 140  K 5.6* 5.4* 5.2 4.6 4.3  CL 109  110 102 108 105  CO2 25 24 24 27 26   GLUCOSE 88 86 67* 63* 72  BUN 21 14 10 14 15   CREATININE 0.71 0.76 0.57 0.60 0.53  CALCIUM 8.5 9.2 9.9 9.0 8.7   Liver Function Tests:  Recent Labs Lab 12/15/13 1608 12/17/13 0311  AST 56* 38*  ALT 56* 42*  ALKPHOS 195* 169*  BILITOT <0.2* 0.2*  PROT 7.2 6.5  ALBUMIN 2.6* 2.3*   CBC:  Recent Labs Lab 12/15/13 1608  12/16/13 0240 12/17/13 0311 12/18/13 0500 12/19/13 0510 12/20/13 0705  WBC 5.7 6.5 8.6 17.3* 13.8* 10.9*  NEUTROABS 3.3  --   --   --  9.5*  --   HGB 9.3* 9.2* 8.9* 10.2* 9.4* 8.4*  HCT 27.2* 27.7* 26.7* 30.5* 27.4* 25.2*  MCV 101.1* 99.3 100.0 98.7 100.4* 100.4*  PLT 73* 92* 105* 119* 110* 104*   BNP (last 3 results)  Recent Labs  10/28/13 1547  PROBNP 488.2*   CBG:  Recent Labs Lab 12/19/13 0655 12/19/13 1130 12/19/13 1638 12/19/13 2315 12/20/13 0609  GLUCAP 87 101* 125* 104* 71    Recent Results (from the past 240 hour(s))  CULTURE, BLOOD (ROUTINE X 2)     Status: None   Collection Time    12/15/13  4:08 PM      Result Value Ref Range Status   Specimen Description BLOOD HAND LEFT   Final   Special Requests BOTTLES DRAWN AEROBIC AND ANAEROBIC 3CCBLUE 2CCRED   Final   Culture  Setup Time     Final   Value: 12/15/2013 22:32     Performed at [facility]   Culture     Final   Value:        BLOOD CULTURE RECEIVED NO GROWTH TO DATE CULTURE WILL BE HELD FOR 5 DAYS BEFORE ISSUING A FINAL NEGATIVE REPORT     Performed at [facility]   Report Status PENDING   Incomplete  CULTURE, BLOOD (ROUTINE X 2)     Status: None   Collection Time    12/15/13  4:24 PM      Result Value Ref Range Status   Specimen Description BLOOD HAND RIGHT   Final   Special Requests BOTTLES DRAWN AEROBIC AND ANAEROBIC 10CC   Final   Culture  Setup Time     Final   Value: 12/15/2013 22:32     Performed at [facility]   Culture     Final   Value:        BLOOD CULTURE RECEIVED NO GROWTH TO DATE CULTURE WILL BE HELD FOR 5 DAYS BEFORE ISSUING A FINAL NEGATIVE REPORT     Performed at [facility]   Report Status PENDING   Incomplete  MRSA PCR SCREENING     Status: None   Collection Time    12/15/13  8:58 PM      Result Value Ref Range Status   MRSA by PCR NEGATIVE  NEGATIVE Final   Comment:            The GeneXpert MRSA Assay (FDA     approved for NASAL  specimens     only), is one component of a     comprehensive MRSA colonization     surveillance program. It is not     intended to diagnose MRSA     infection nor to guide or     monitor treatment for     MRSA infections.  Time spent : 56mins     Linda Biehn DO [facility] Office  [phone] Pager [phone]  **If unable to reach the above provider after paging please contact the [place] @ [phone]  On-Call/Text Page:      [email or web address]      password [email or web address]  If 7PM-7AM, please contact night-coverage [email or web address] Password [email or web address] 12/20/2013, 11:00 AM   LOS: 5 days

## 2013-12-20 NOTE — Progress Notes (Signed)
Pt's BP down to 90/61and temp 97.9. Paged on call  Physician and page returned with orders to bolus patient with 559ml NS. Will continue to monitor. Crista Nuon, Rande Brunt

## 2013-12-21 LAB — CULTURE, BLOOD (ROUTINE X 2)
Culture: NO GROWTH
Culture: NO GROWTH

## 2013-12-21 LAB — CBC
HCT: 23.6 % — ABNORMAL LOW (ref 36.0–46.0)
Hemoglobin: 8.1 g/dL — ABNORMAL LOW (ref 12.0–15.0)
MCH: 34.3 pg — ABNORMAL HIGH (ref 26.0–34.0)
MCHC: 34.3 g/dL (ref 30.0–36.0)
MCV: 100 fL (ref 78.0–100.0)
Platelets: 105 10*3/uL — ABNORMAL LOW (ref 150–400)
RBC: 2.36 MIL/uL — AB (ref 3.87–5.11)
RDW: 15.8 % — AB (ref 11.5–15.5)
WBC: 8.1 10*3/uL (ref 4.0–10.5)

## 2013-12-21 LAB — BASIC METABOLIC PANEL
BUN: 13 mg/dL (ref 6–23)
CHLORIDE: 108 meq/L (ref 96–112)
CO2: 27 meq/L (ref 19–32)
CREATININE: 0.45 mg/dL — AB (ref 0.50–1.10)
Calcium: 8.9 mg/dL (ref 8.4–10.5)
GFR calc non Af Amer: >90 mL/min (ref >90)
Glucose, Bld: 73 mg/dL (ref 70–99)
POTASSIUM: 4.2 meq/L (ref 3.7–5.3)
SODIUM: 145 meq/L (ref 137–147)

## 2013-12-21 LAB — GLUCOSE, CAPILLARY: GLUCOSE-CAPILLARY: 86 mg/dL (ref 70–99)

## 2013-12-21 MED ORDER — LEVETIRACETAM 750 MG PO TABS: 750.0000 mg | ORAL_TABLET | Freq: Two times a day (BID) | ORAL | Status: DC

## 2013-12-21 MED ORDER — LEVETIRACETAM 750 MG PO TABS
750.0000 mg | ORAL_TABLET | Freq: Every evening | ORAL | Status: DC
  Filled 2013-12-21: qty 1

## 2013-12-21 MED ORDER — FOLIC ACID 1 MG PO TABS: 1.0000 mg | ORAL_TABLET | Freq: Every day | ORAL | Status: DC

## 2013-12-21 MED ORDER — LEVETIRACETAM 500 MG PO TABS: 500.0000 mg | ORAL_TABLET | Freq: Every morning | ORAL | Status: DC

## 2013-12-21 MED ORDER — LEVETIRACETAM 750 MG PO TABS
750.0000 mg | ORAL_TABLET | Freq: Two times a day (BID) | ORAL | Status: DC
  Filled 2013-12-21 (×2): qty 1

## 2013-12-21 MED ORDER — ENSURE PUDDING PO PUDG: 1.0000 | Freq: Three times a day (TID) | ORAL | Status: DC

## 2013-12-21 MED ORDER — LEVETIRACETAM 500 MG PO TABS
500.0000 mg | ORAL_TABLET | Freq: Every morning | ORAL | Status: DC
  Filled 2013-12-21: qty 1

## 2013-12-21 MED ORDER — CLOBETASOL PROPIONATE 0.05 % EX CREA
TOPICAL_CREAM | Freq: Every day | CUTANEOUS | Status: DC
Start: 2013-12-21 — End: 2014-05-14

## 2013-12-21 MED ORDER — LEVETIRACETAM 750 MG PO TABS
750.0000 mg | ORAL_TABLET | Freq: Three times a day (TID) | ORAL | Status: DC
  Administered 2013-12-21: 750 mg via ORAL
  Filled 2013-12-21 (×3): qty 1

## 2013-12-21 NOTE — Social Work (Signed)
Patient for d/c today back to [facility] group home- plans confirmed with Suanne Marker at facility- MD spoke with Suanne Marker directly to get meds understood. Family and patient agreeable to this plan- plan transfer via group home staff. Eduard Clos, MSW, [place]

## 2013-12-21 NOTE — Progress Notes (Signed)
Pt d/c to group home . Condition at baseline  .picked up by group home transport representative. Ms Dawn [name] . Report called to receiving facility.

## 2013-12-21 NOTE — Discharge Summary (Addendum)
Physician Discharge Summary  Dawn [name] [id number] DOB: 14-Mar-1946 DOA: 12/15/2013  PCP: [name]  Admit date: 12/15/2013 Discharge date: 12/21/2013  Time spent: 35 minutes  Recommendations for Outpatient Follow-up:  1. Patient needs to be fed all meals and offered liquid and encouraged to drink every hour during the day 2. Periodic CBC 3. Check HR- can resume coreg if HR > 100 and SBP> 100  Discharge Diagnoses:  Active Problems:   Seizure disorder   MR (mental retardation)   Hypothyroidism   HLD (hyperlipidemia)   Thrombocytopenia   Hypotension   Dehydration   Anemia   Diastolic heart failure, NYHA class 2   Hypothermia   SIRS (systemic inflammatory response syndrome)   Discharge Condition: improved  Diet recommendation: DYS 1 thin  Filed Weights   12/19/13 0500 12/20/13 0458 12/21/13 0500  Weight: 61.3 kg (135 lb 2.3 oz) 61.5 kg (135 lb 9.3 oz) 60.8 kg (134 lb 0.6 oz)    History of present illness:  Dawn [name] is a 68 y.o. female with a Past Medical History of mental retardation, seizure disorder who lives in a group home, brought to the emergency room after she was found to have hypothermia on a routine vital sign check at the facility. Please note, this patient is completely nonverbal, and most of the history is obtained from the ED staff and also from her to caregivers were present at bedside. Apparently, patient was in usual state of health, her caregivers have not noticed any unusual events, there is no history of any headache, nausea, vomiting, diarrhea. Patient has not indicated to her caregivers about any pain. She was at the daycare center at the facility, where a routine set of vitals found her to have hypothermia, she was then transferred to the emergency room. A chest x-ray was negative for pneumonia, urinalysis negative for UTI, she was placed on a bair hugger, given IV fluids, I was then asked to admit this patient for further evaluation and  treatment   Hospital Course:  Hypotension/Dehydration/ Hypothermia  -Cortisol 13.7 so doubt adrenal insufficiency  -Suspect patient has failure to thrive related to her underlying mental status  -During previous admission suggested that facility have meeting with family to at least discuss goals of care and to determine if a MOST form was appropriate  - patient's sister who confirmed the patient was to remain a FULL CODE despite poor quality of life and poor overall prognosis - sister stated she still wants aggressive care.   Leukocytosis  -resolved -appears reactive to constipation  SIRS/mild fever- resolved  -No clear source of infection - discontinue antibiotics - blood cultures obtained at time of admission and chest x-ray negative  LE duplex negative for DVT  Hypothyroidism  -TSH ok  -Continue Synthroid   Mild transaminitis  -resolving   Thrombocytopenia  -Chronic and stable   Anemia  -Chronic and stable  -will add folic acid as on depakote  -heme negative  Seizure disorder  -Continue home medications  -keppra dosage in question- per Jan 2015 admission dose was 750 mg TID- dose upon admission this visit was verified with group home at 750 mg TID- now group home saying patient takes 500 mg in AM and 750 q PM  Developmental disability  -At baseline/nonverbal   HLD (hyperlipidemia)   Diastolic heart failure, NYHA class 2  -Compensated  H/o a fib -not been on coreg since she has been here- and has not had ANY fast HR  Per  [name] at group home has card appointment   Procedures:  none  Consultations:  none  Discharge Exam: Filed Vitals:   12/21/13 0544  BP: 118/67  Pulse: 74  Temp: 98.4 F (36.9 C)  Resp: 18    General: awake, non verbal Cardiovascular: rrr Respiratory: clear anterior  Discharge Instructions      Discharge Orders   Future Appointments Provider Department Dept Phone   12/29/2013 8:45 AM Pixie Casino, MD [facility]  [facility] [phone]   Future Orders Complete By Expires   Discharge instructions  As directed    Comments:     DYS diet thin liquids Encourage PO intake- requires full assistance Periodic cbc, bmp   Increase activity slowly  As directed        Medication List    STOP taking these medications       clobetasol 0.05 % external solution  Commonly known as:  TEMOVATE  Replaced by:  clobetasol cream 0.05 %     nitrofurantoin 50 MG capsule  Commonly known as:  MACRODANTIN      TAKE these medications       atorvastatin 10 MG tablet  Commonly known as:  LIPITOR  Take 10 mg by mouth daily.     camphor-menthol lotion  Commonly known as:  SARNA  Apply 1 application topically 2 (two) times daily.     clobetasol cream 0.05 %  Commonly known as:  TEMOVATE  Apply topically at bedtime.     divalproex 250 MG DR tablet  Commonly known as:  DEPAKOTE  Take 750 mg by mouth 3 (three) times daily.     feeding supplement (ENSURE) Pudg  Take 1 Container by mouth 3 (three) times daily between meals.     folic acid 1 MG tablet  Commonly known as:  FOLVITE  Take 1 tablet (1 mg total) by mouth daily.     levETIRAcetam 750 MG tablet  Commonly known as:  KEPPRA  Take 1 tablet (750 mg total) by mouth 2 (two) times daily before lunch and supper.     levETIRAcetam 500 MG tablet  Commonly known as:  KEPPRA  Take 1 tablet (500 mg total) by mouth every morning.  Start taking on:  12/22/2013     levothyroxine 75 MCG tablet  Commonly known as:  SYNTHROID, LEVOTHROID  Take 75 mcg by mouth daily.     LORazepam 2 MG tablet  Commonly known as:  ATIVAN  Take 2 mg by mouth See admin instructions. Take 2mg  1hour prior to dental procedure     OYSTER CALCIUM 500 + D 500-200 MG-UNIT per tablet  Generic drug:  calcium-vitamin D  Take 1 tablet by mouth daily.     polyethylene glycol packet  Commonly known as:  MIRALAX / GLYCOLAX  Take 17 g by mouth daily.     tolnaftate 1 % spray  Commonly  known as:  TINACTIN  Apply 1 application topically at bedtime.     vitamin C 500 MG tablet  Commonly known as:  ASCORBIC ACID  Take 500 mg by mouth 3 (three) times daily.       Allergies  Allergen Reactions  . Ppd [Tuberculin Purified Protein Derivative]     Per MAR      The results of significant diagnostics from this hospitalization (including imaging, microbiology, ancillary and laboratory) are listed below for reference.    Significant Diagnostic Studies: Dg Chest Port 1 View  12/18/2013   CLINICAL DATA:  Fever and  elevated white blood cell count  EXAM: PORTABLE CHEST - 1 VIEW  COMPARISON:  December 15, 2013  FINDINGS: Patient's mandible obscures much of the chest. There is no demonstrable edema or consolidation. The heart size and pulmonary vascularity appear within normal limits. No bone lesions. No adenopathy.  IMPRESSION: Somewhat limited examination due to patient's mandible obscuring much of the chest. No edema or consolidation identified.   Electronically Signed   By: Lowella Grip M.D.   On: 12/18/2013 08:48   Dg Chest Port 1 View  12/15/2013   CLINICAL DATA:  Cold exposure.  EXAM: PORTABLE CHEST - 1 VIEW  COMPARISON:  10/28/2013.  FINDINGS: The cardiac silhouette, mediastinal and hilar contours are stable. There is tortuosity and ectasia of the thoracic aorta. Low lung volumes with vascular crowding and basilar atelectasis but no infiltrates, edema or effusions. The bony thorax is grossly normal.  IMPRESSION: No acute cardiopulmonary findings.   Electronically Signed   By: Kalman Jewels M.D.   On: 12/15/2013 16:34    Microbiology: Recent Results (from the past 240 hour(s))  CULTURE, BLOOD (ROUTINE X 2)     Status: None   Collection Time    12/15/13  4:08 PM      Result Value Ref Range Status   Specimen Description BLOOD HAND LEFT   Final   Special Requests BOTTLES DRAWN AEROBIC AND ANAEROBIC 3CCBLUE 2CCRED   Final   Culture  Setup Time     Final   Value: 12/15/2013  22:32     Performed at [facility]   Culture     Final   Value: NO GROWTH 5 DAYS     Performed at [facility]   Report Status 12/21/2013 FINAL   Final  CULTURE, BLOOD (ROUTINE X 2)     Status: None   Collection Time    12/15/13  4:24 PM      Result Value Ref Range Status   Specimen Description BLOOD HAND RIGHT   Final   Special Requests BOTTLES DRAWN AEROBIC AND ANAEROBIC 10CC   Final   Culture  Setup Time     Final   Value: 12/15/2013 22:32     Performed at [facility]   Culture     Final   Value: NO GROWTH 5 DAYS     Performed at [facility]   Report Status 12/21/2013 FINAL   Final  MRSA PCR SCREENING     Status: None   Collection Time    12/15/13  8:58 PM      Result Value Ref Range Status   MRSA by PCR NEGATIVE  NEGATIVE Final   Comment:            The GeneXpert MRSA Assay (FDA     approved for NASAL specimens     only), is one component of a     comprehensive MRSA colonization     surveillance program. It is not     intended to diagnose MRSA     infection nor to guide or     monitor treatment for     MRSA infections.     Labs: Basic Metabolic Panel:  Recent Labs Lab 12/17/13 0311 12/18/13 0500 12/19/13 0510 12/20/13 0705 12/21/13 0729  NA 144 140 144 140 145  K 5.4* 5.2 4.6 4.3 4.2  CL 110 102 108 105 108  CO2 24 24 27 26 27   GLUCOSE 86 67* 63* 72 73  BUN 14 10 14  15 13  CREATININE 0.76 0.57 0.60 0.53 0.45*  CALCIUM 9.2 9.9 9.0 8.7 8.9   Liver Function Tests:  Recent Labs Lab 12/15/13 1608 12/17/13 0311  AST 56* 38*  ALT 56* 42*  ALKPHOS 195* 169*  BILITOT <0.2* 0.2*  PROT 7.2 6.5  ALBUMIN 2.6* 2.3*   No results found for this basename: LIPASE, AMYLASE,  in the last 168 hours No results found for this basename: AMMONIA,  in the last 168 hours CBC:  Recent Labs Lab 12/15/13 1608  12/17/13 0311 12/18/13 0500 12/19/13 0510 12/20/13 0705 12/21/13 0729  WBC 5.7  < > 8.6 17.3* 13.8* 10.9* 8.1   NEUTROABS 3.3  --   --   --  9.5*  --   --   HGB 9.3*  < > 8.9* 10.2* 9.4* 8.4* 8.1*  HCT 27.2*  < > 26.7* 30.5* 27.4* 25.2* 23.6*  MCV 101.1*  < > 100.0 98.7 100.4* 100.4* 100.0  PLT 73*  < > 105* 119* 110* 104* 105*  < > = values in this interval not displayed. Cardiac Enzymes: No results found for this basename: CKTOTAL, CKMB, CKMBINDEX, TROPONINI,  in the last 168 hours BNP: BNP (last 3 results)  Recent Labs  10/28/13 1547  PROBNP 488.2*   CBG:  Recent Labs Lab 12/20/13 0609 12/20/13 1155 12/20/13 1615 12/20/13 2254 12/21/13 0642  GLUCAP 71 72 105* 97 86       Signed:  Gurvir Schrom  [facility] 12/21/2013, 11:59 AM

## 2013-12-29 ENCOUNTER — Encounter: Payer: Self-pay | Admitting: Internal Medicine

## 2013-12-29 ENCOUNTER — Ambulatory Visit (INDEPENDENT_AMBULATORY_CARE_PROVIDER_SITE_OTHER): Payer: Medicare Other | Admitting: Internal Medicine

## 2013-12-29 VITALS — BP 123/76 | HR 76 | Ht 62.0 in | Wt 125.2 lb

## 2013-12-29 DIAGNOSIS — I214 Non-ST elevation (NSTEMI) myocardial infarction: Secondary | ICD-10-CM

## 2013-12-29 DIAGNOSIS — I5030 Unspecified diastolic (congestive) heart failure: Secondary | ICD-10-CM

## 2013-12-29 DIAGNOSIS — F79 Unspecified intellectual disabilities: Secondary | ICD-10-CM

## 2013-12-29 DIAGNOSIS — G40909 Epilepsy, unspecified, not intractable, without status epilepticus: Secondary | ICD-10-CM

## 2013-12-29 DIAGNOSIS — I959 Hypotension, unspecified: Secondary | ICD-10-CM

## 2013-12-29 DIAGNOSIS — I4891 Unspecified atrial fibrillation: Secondary | ICD-10-CM

## 2013-12-29 NOTE — Progress Notes (Signed)
OFFICE NOTE  Chief Complaint:  New patient, recent hospitalization for PAF, hypotension  Primary Care Physician: [name]  HPI:  Dawn [name] is a 68 y.o. female with a Past Medical History of mental retardation, PAF, recent NSTEMI, seizure disorder who lives in a group home.  She was initially seen in January and had atrial fibrillation with rapid ventricular response. She was consult on by Dr. Frances Nickels, who felt that she was not a candidate for long-term anticoagulation secondary to seizure disorder and mental retardation. She is noncommunicative. She was placed on low-dose Lipitor and carvedilol. 2 months later she was admitted again with hypotension and hypothermia. There was concern for sepsis. Her beta blocker was stopped and she remains normotensive. Her only cardiac meds he is Lipitor. As previously mentioned she cannot provide any symptoms. She is accompanied today by a group home representative and transportation provider.    PMHx:  Past Medical History  Diagnosis Date  . Seizures   . Osteoporosis   . Thyroid disease     hypothyroid  . High cholesterol   . Cataracts, bilateral   . Optic atrophy   . Paraparesis     mild  . Hypothyroidism   . Anginal pain   . Coronary artery disease     Past Surgical History  Procedure Laterality Date  . Midline incision      FAMHx:  History reviewed. No pertinent family history.  SOCHx:   reports that she has never smoked. She has never used smokeless tobacco. She reports that she does not drink alcohol. Her drug history is not on file.  ALLERGIES:  Allergies  Allergen Reactions  . Carvedilol   . Ppd [Tuberculin Purified Protein Derivative]     Per [facility]    ROS: Review of systems not obtained due to patient factors.  HOME MEDS: Current Outpatient Prescriptions  Medication Sig Dispense Refill  . atorvastatin (LIPITOR) 10 MG tablet Take 10 mg by mouth daily.      . calcium-vitamin D (OYSTER CALCIUM 500 + D)  500-200 MG-UNIT per tablet Take 1 tablet by mouth daily.      . camphor-menthol (SARNA) lotion Apply 1 application topically 2 (two) times daily.      . clobetasol cream (TEMOVATE) 0.05 % Apply topically at bedtime.  30 g  0  . divalproex (DEPAKOTE) 250 MG DR tablet Take 750 mg by mouth 3 (three) times daily.       . feeding supplement, ENSURE, (ENSURE) PUDG Take 1 Container by mouth 3 (three) times daily between meals.    0  . folic acid (FOLVITE) 1 MG tablet Take 1 tablet (1 mg total) by mouth daily.  30 tablet  0  . levETIRAcetam (KEPPRA) 500 MG tablet Take 1 tablet (500 mg total) by mouth every morning.      . levETIRAcetam (KEPPRA) 750 MG tablet Take 1 tablet (750 mg total) by mouth 2 (two) times daily before lunch and supper.      . levothyroxine (SYNTHROID, LEVOTHROID) 75 MCG tablet Take 75 mcg by mouth daily.      Marland Kitchen LORazepam (ATIVAN) 2 MG tablet Take 2 mg by mouth See admin instructions. Take 2mg  1hour prior to dental procedure      . polyethylene glycol (MIRALAX / GLYCOLAX) packet Take 17 g by mouth daily.      Marland Kitchen tolnaftate (TINACTIN) 1 % spray Apply 1 application topically at bedtime.       . vitamin C (ASCORBIC  ACID) 500 MG tablet Take 500 mg by mouth 3 (three) times daily.        No current facility-administered medications for this visit.    LABS/IMAGING: No results found for this or any previous visit (from the past 48 hour(s)). No results found.  VITALS: BP 123/76  Pulse 76  Ht 5\' 2"  (1.575 m)  Wt 125 lb 3.2 oz (56.79 kg)  BMI 22.89 kg/m2  EXAM: General appearance: braced in a wheelchair, moves, but not purposefully Neck: no carotid bruit, no JVD and thyroid not enlarged, symmetric, no tenderness/mass/nodules Lungs: clear to auscultation bilaterally Heart: regular rate and rhythm Abdomen: soft, non-tender; bowel sounds normal; no masses,  no organomegaly Extremities: extremities normal, atraumatic, no cyanosis or edema and TED hose in place Pulses: 2+ and  symmetric Skin: Skin color, texture, turgor normal. No rashes or lesions Neurologic: Mental status: Non-communicative .  EKG: Sinus rhythm at 76  ASSESSMENT: 1. PAF 2. Dyslipidemia 3. Hypertension and recent hypothermia 4. Mental retardation and non-communicative 5. Low functional status 6. Seizure disorder-not an anticoagulation candidate 7. Recent NSTEMI  PLAN: 1.  Unfortunately Dawn [name] has advanced mental retardation and a seizure disorder. She recently had a non-ST elevation MI in the setting of paroxysmal atrial fibrillation. This resolves spontaneously. I agree that she is not a candidate for warfarin or a novel oral anticoagulant. I do feel that she would tolerate low-dose aspirin 81 mg daily, which would be beneficial for both PAF and for her recent MI at reducing risk somewhat. It is unlikely that she'll have life-threatening bleeding on low-dose aspirin. I agree with holding carvedilol at this point due to her recent unexplained hypotension and hypothermia. I would continue her low-dose Lipitor. There are no plans in the future for cardiac catheterization or stress testing. She is not a candidate for advanced cardiac therapies.  Thank your kind referral. She could followup with me as needed.  Dawn Casino, MD, [facility] Attending Cardiologist [facility]  Dawn [name] C 12/29/2013, 9:08 AM

## 2014-02-16 ENCOUNTER — Encounter (INDEPENDENT_AMBULATORY_CARE_PROVIDER_SITE_OTHER): Payer: Self-pay

## 2014-02-16 ENCOUNTER — Ambulatory Visit
Admission: RE | Admit: 2014-02-16 | Discharge: 2014-02-16 | Disposition: A | Discharge status: Home / Self Care | Payer: Medicare Other | Source: Ambulatory Visit | Attending: Family Medicine | Admitting: Family Medicine

## 2014-02-16 DIAGNOSIS — Z1231 Encounter for screening mammogram for malignant neoplasm of breast: Secondary | ICD-10-CM

## 2014-05-14 ENCOUNTER — Encounter (HOSPITAL_COMMUNITY): Payer: Self-pay | Admitting: Emergency Medicine

## 2014-05-14 ENCOUNTER — Inpatient Hospital Stay (HOSPITAL_COMMUNITY)
Admission: EM | Admit: 2014-05-14 | Discharge: 2014-06-09 | DRG: 871 | Disposition: A | Discharge status: Skilled Nursing Facility | Payer: Medicare Other | Attending: Internal Medicine | Admitting: Internal Medicine

## 2014-05-14 ENCOUNTER — Emergency Department (HOSPITAL_COMMUNITY): Payer: Medicare Other

## 2014-05-14 DIAGNOSIS — Z6826 Body mass index (BMI) 26.0-26.9, adult: Secondary | ICD-10-CM | POA: Diagnosis not present

## 2014-05-14 DIAGNOSIS — F72 Severe intellectual disabilities: Secondary | ICD-10-CM | POA: Diagnosis present

## 2014-05-14 DIAGNOSIS — N390 Urinary tract infection, site not specified: Secondary | ICD-10-CM | POA: Diagnosis present

## 2014-05-14 DIAGNOSIS — A4151 Sepsis due to Escherichia coli [E. coli]: Principal | ICD-10-CM | POA: Diagnosis present

## 2014-05-14 DIAGNOSIS — G809 Cerebral palsy, unspecified: Secondary | ICD-10-CM | POA: Diagnosis present

## 2014-05-14 DIAGNOSIS — R29810 Facial weakness: Secondary | ICD-10-CM

## 2014-05-14 DIAGNOSIS — Y833 Surgical operation with formation of external stoma as the cause of abnormal reaction of the patient, or of later complication, without mention of misadventure at the time of the procedure: Secondary | ICD-10-CM | POA: Diagnosis present

## 2014-05-14 DIAGNOSIS — D649 Anemia, unspecified: Secondary | ICD-10-CM | POA: Diagnosis present

## 2014-05-14 DIAGNOSIS — K9422 Gastrostomy infection: Secondary | ICD-10-CM | POA: Diagnosis not present

## 2014-05-14 DIAGNOSIS — E039 Hypothyroidism, unspecified: Secondary | ICD-10-CM | POA: Diagnosis present

## 2014-05-14 DIAGNOSIS — E870 Hyperosmolality and hypernatremia: Secondary | ICD-10-CM | POA: Diagnosis present

## 2014-05-14 DIAGNOSIS — E785 Hyperlipidemia, unspecified: Secondary | ICD-10-CM | POA: Diagnosis present

## 2014-05-14 DIAGNOSIS — E8809 Other disorders of plasma-protein metabolism, not elsewhere classified: Secondary | ICD-10-CM | POA: Diagnosis present

## 2014-05-14 DIAGNOSIS — I5032 Chronic diastolic (congestive) heart failure: Secondary | ICD-10-CM | POA: Diagnosis present

## 2014-05-14 DIAGNOSIS — R32 Unspecified urinary incontinence: Secondary | ICD-10-CM | POA: Diagnosis not present

## 2014-05-14 DIAGNOSIS — R6520 Severe sepsis without septic shock: Secondary | ICD-10-CM

## 2014-05-14 DIAGNOSIS — R4702 Dysphasia: Secondary | ICD-10-CM

## 2014-05-14 DIAGNOSIS — Z7982 Long term (current) use of aspirin: Secondary | ICD-10-CM

## 2014-05-14 DIAGNOSIS — G808 Other cerebral palsy: Secondary | ICD-10-CM

## 2014-05-14 DIAGNOSIS — F0390 Unspecified dementia without behavioral disturbance: Secondary | ICD-10-CM | POA: Diagnosis present

## 2014-05-14 DIAGNOSIS — F79 Unspecified intellectual disabilities: Secondary | ICD-10-CM

## 2014-05-14 DIAGNOSIS — D352 Benign neoplasm of pituitary gland: Secondary | ICD-10-CM | POA: Diagnosis present

## 2014-05-14 DIAGNOSIS — Z515 Encounter for palliative care: Secondary | ICD-10-CM | POA: Diagnosis not present

## 2014-05-14 DIAGNOSIS — D353 Benign neoplasm of craniopharyngeal duct: Secondary | ICD-10-CM

## 2014-05-14 DIAGNOSIS — E43 Unspecified severe protein-calorie malnutrition: Secondary | ICD-10-CM | POA: Diagnosis present

## 2014-05-14 DIAGNOSIS — A419 Sepsis, unspecified organism: Secondary | ICD-10-CM | POA: Diagnosis not present

## 2014-05-14 DIAGNOSIS — I2510 Atherosclerotic heart disease of native coronary artery without angina pectoris: Secondary | ICD-10-CM | POA: Diagnosis present

## 2014-05-14 DIAGNOSIS — T182XXA Foreign body in stomach, initial encounter: Secondary | ICD-10-CM | POA: Diagnosis present

## 2014-05-14 DIAGNOSIS — R509 Fever, unspecified: Secondary | ICD-10-CM

## 2014-05-14 DIAGNOSIS — E7800 Pure hypercholesterolemia, unspecified: Secondary | ICD-10-CM | POA: Diagnosis present

## 2014-05-14 DIAGNOSIS — D696 Thrombocytopenia, unspecified: Secondary | ICD-10-CM | POA: Diagnosis present

## 2014-05-14 DIAGNOSIS — R1310 Dysphagia, unspecified: Secondary | ICD-10-CM | POA: Diagnosis present

## 2014-05-14 DIAGNOSIS — E878 Other disorders of electrolyte and fluid balance, not elsewhere classified: Secondary | ICD-10-CM | POA: Diagnosis present

## 2014-05-14 DIAGNOSIS — I4891 Unspecified atrial fibrillation: Secondary | ICD-10-CM | POA: Diagnosis present

## 2014-05-14 DIAGNOSIS — Z452 Encounter for adjustment and management of vascular access device: Secondary | ICD-10-CM

## 2014-05-14 DIAGNOSIS — I509 Heart failure, unspecified: Secondary | ICD-10-CM | POA: Diagnosis present

## 2014-05-14 DIAGNOSIS — I480 Paroxysmal atrial fibrillation: Secondary | ICD-10-CM

## 2014-05-14 DIAGNOSIS — I959 Hypotension, unspecified: Secondary | ICD-10-CM

## 2014-05-14 DIAGNOSIS — G9341 Metabolic encephalopathy: Secondary | ICD-10-CM | POA: Diagnosis present

## 2014-05-14 DIAGNOSIS — E860 Dehydration: Secondary | ICD-10-CM | POA: Diagnosis present

## 2014-05-14 DIAGNOSIS — R601 Generalized edema: Secondary | ICD-10-CM

## 2014-05-14 DIAGNOSIS — G40909 Epilepsy, unspecified, not intractable, without status epilepticus: Secondary | ICD-10-CM | POA: Diagnosis present

## 2014-05-14 DIAGNOSIS — Z993 Dependence on wheelchair: Secondary | ICD-10-CM

## 2014-05-14 DIAGNOSIS — K029 Dental caries, unspecified: Secondary | ICD-10-CM | POA: Diagnosis present

## 2014-05-14 DIAGNOSIS — Z66 Do not resuscitate: Secondary | ICD-10-CM | POA: Diagnosis present

## 2014-05-14 DIAGNOSIS — T68XXXA Hypothermia, initial encounter: Secondary | ICD-10-CM

## 2014-05-14 DIAGNOSIS — E876 Hypokalemia: Secondary | ICD-10-CM | POA: Diagnosis present

## 2014-05-14 DIAGNOSIS — J690 Pneumonitis due to inhalation of food and vomit: Secondary | ICD-10-CM

## 2014-05-14 DIAGNOSIS — R6521 Severe sepsis with septic shock: Secondary | ICD-10-CM

## 2014-05-14 DIAGNOSIS — J90 Pleural effusion, not elsewhere classified: Secondary | ICD-10-CM

## 2014-05-14 LAB — I-STAT TROPONIN, ED: TROPONIN I, POC: 0.02 ng/mL (ref 0.00–0.08)

## 2014-05-14 LAB — URINALYSIS, ROUTINE W REFLEX MICROSCOPIC
Bilirubin Urine: NEGATIVE
GLUCOSE, UA: NEGATIVE mg/dL
Ketones, ur: NEGATIVE mg/dL
Nitrite: POSITIVE — AB
Protein, ur: 30 mg/dL — AB
SPECIFIC GRAVITY, URINE: 1.015 (ref 1.005–1.030)
UROBILINOGEN UA: 1 mg/dL (ref 0.0–1.0)
pH: 5.5 (ref 5.0–8.0)

## 2014-05-14 LAB — COMPREHENSIVE METABOLIC PANEL
ALBUMIN: 1.1 g/dL — AB (ref 3.5–5.2)
ALK PHOS: 108 U/L (ref 39–117)
ALT: 13 U/L (ref 0–35)
ANION GAP: 12 (ref 5–15)
AST: 40 U/L — ABNORMAL HIGH (ref 0–37)
BUN: 38 mg/dL — ABNORMAL HIGH (ref 6–23)
CO2: 26 mEq/L (ref 19–32)
CREATININE: 1.38 mg/dL — AB (ref 0.50–1.10)
Calcium: 8.1 mg/dL — ABNORMAL LOW (ref 8.4–10.5)
Chloride: 128 mEq/L — ABNORMAL HIGH (ref 96–112)
GFR calc Af Amer: 45 mL/min — ABNORMAL LOW (ref >90)
GFR calc non Af Amer: 39 mL/min — ABNORMAL LOW (ref >90)
Glucose, Bld: 70 mg/dL (ref 70–99)
POTASSIUM: 4.8 meq/L (ref 3.7–5.3)
Sodium: 166 mEq/L (ref 137–147)
Total Bilirubin: 0.3 mg/dL (ref 0.3–1.2)
Total Protein: 6.9 g/dL (ref 6.0–8.3)

## 2014-05-14 LAB — I-STAT CHEM 8, ED
BUN: 44 mg/dL — AB (ref 6–23)
Calcium, Ion: 1.08 mmol/L — ABNORMAL LOW (ref 1.13–1.30)
Chloride: 128 mEq/L — ABNORMAL HIGH (ref 96–112)
Creatinine, Ser: 1.4 mg/dL — ABNORMAL HIGH (ref 0.50–1.10)
Glucose, Bld: 71 mg/dL (ref 70–99)
HCT: 22 % — ABNORMAL LOW (ref 36.0–46.0)
Hemoglobin: 7.5 g/dL — ABNORMAL LOW (ref 12.0–15.0)
POTASSIUM: 4.5 meq/L (ref 3.7–5.3)
SODIUM: 167 meq/L — AB (ref 137–147)
TCO2: 27 mmol/L (ref 0–100)

## 2014-05-14 LAB — CBC WITH DIFFERENTIAL/PLATELET
Basophils Absolute: 0.3 10*3/uL — ABNORMAL HIGH (ref 0.0–0.1)
Basophils Relative: 1 % (ref 0–1)
EOS PCT: 0 % (ref 0–5)
Eosinophils Absolute: 0 10*3/uL (ref 0.0–0.7)
HCT: 29.6 % — ABNORMAL LOW (ref 36.0–46.0)
Hemoglobin: 9.3 g/dL — ABNORMAL LOW (ref 12.0–15.0)
Lymphocytes Relative: 11 % — ABNORMAL LOW (ref 12–46)
Lymphs Abs: 3 10*3/uL (ref 0.7–4.0)
MCH: 30.7 pg (ref 26.0–34.0)
MCHC: 31.4 g/dL (ref 30.0–36.0)
MCV: 97.7 fL (ref 78.0–100.0)
MONOS PCT: 11 % (ref 3–12)
Monocytes Absolute: 3 10*3/uL — ABNORMAL HIGH (ref 0.1–1.0)
NEUTROS PCT: 77 % (ref 43–77)
Neutro Abs: 20.9 10*3/uL — ABNORMAL HIGH (ref 1.7–7.7)
PLATELETS: 159 10*3/uL (ref 150–400)
RBC: 3.03 MIL/uL — AB (ref 3.87–5.11)
RDW: 19.5 % — ABNORMAL HIGH (ref 11.5–15.5)
WBC: 27.2 10*3/uL — AB (ref 4.0–10.5)

## 2014-05-14 LAB — URINE MICROSCOPIC-ADD ON

## 2014-05-14 LAB — LACTIC ACID, PLASMA: Lactic Acid, Venous: 2.6 mmol/L — ABNORMAL HIGH (ref 0.5–2.2)

## 2014-05-14 LAB — I-STAT CG4 LACTIC ACID, ED: Lactic Acid, Venous: 4.51 mmol/L — ABNORMAL HIGH (ref 0.5–2.2)

## 2014-05-14 LAB — VALPROIC ACID LEVEL: VALPROIC ACID LVL: 98.9 ug/mL (ref 50.0–100.0)

## 2014-05-14 MED ORDER — PIPERACILLIN-TAZOBACTAM 3.375 G IVPB
3.3750 g | Freq: Three times a day (TID) | INTRAVENOUS | Status: DC
  Administered 2014-05-14 – 2014-05-17 (×9): 3.375 g via INTRAVENOUS
  Filled 2014-05-14 (×11): qty 50

## 2014-05-14 MED ORDER — DEXTROSE-NACL 5-0.45 % IV SOLN
INTRAVENOUS | Status: DC
  Administered 2014-05-14: 1000 mL via INTRAVENOUS

## 2014-05-14 MED ORDER — PIPERACILLIN-TAZOBACTAM 3.375 G IVPB 30 MIN
3.3750 g | Freq: Once | INTRAVENOUS | Status: AC
  Administered 2014-05-14: 3.375 g via INTRAVENOUS
  Filled 2014-05-14: qty 50

## 2014-05-14 MED ORDER — SODIUM CHLORIDE 0.9 % IV BOLUS (SEPSIS)
1000.0000 mL | Freq: Once | INTRAVENOUS | Status: AC
  Administered 2014-05-14: 1000 mL via INTRAVENOUS

## 2014-05-14 MED ORDER — LEVOTHYROXINE SODIUM 100 MCG IV SOLR
37.5000 ug | Freq: Every day | INTRAVENOUS | Status: DC
  Administered 2014-05-14 – 2014-05-22 (×9): 37.5 ug via INTRAVENOUS
  Filled 2014-05-14 (×9): qty 5

## 2014-05-14 MED ORDER — ALBUTEROL SULFATE (2.5 MG/3ML) 0.083% IN NEBU
2.5000 mg | INHALATION_SOLUTION | RESPIRATORY_TRACT | Status: DC | PRN
  Administered 2014-05-21 (×3): 2.5 mg via RESPIRATORY_TRACT
  Filled 2014-05-14 (×3): qty 3

## 2014-05-14 MED ORDER — LEVETIRACETAM IN NACL 500 MG/100ML IV SOLN
500.0000 mg | INTRAVENOUS | Status: DC
  Administered 2014-05-15 – 2014-05-22 (×8): 500 mg via INTRAVENOUS
  Filled 2014-05-14 (×9): qty 100

## 2014-05-14 MED ORDER — VANCOMYCIN HCL IN DEXTROSE 1-5 GM/200ML-% IV SOLN
1000.0000 mg | Freq: Once | INTRAVENOUS | Status: AC
  Administered 2014-05-14: 1000 mg via INTRAVENOUS
  Filled 2014-05-14: qty 200

## 2014-05-14 MED ORDER — SODIUM CHLORIDE 0.9 % IV SOLN
750.0000 mg | INTRAVENOUS | Status: DC
  Administered 2014-05-14 – 2014-05-22 (×16): 750 mg via INTRAVENOUS
  Filled 2014-05-14 (×17): qty 7.5

## 2014-05-14 MED ORDER — VANCOMYCIN HCL IN DEXTROSE 750-5 MG/150ML-% IV SOLN
750.0000 mg | INTRAVENOUS | Status: DC
  Administered 2014-05-15 – 2014-05-16 (×2): 750 mg via INTRAVENOUS
  Filled 2014-05-14 (×3): qty 150

## 2014-05-14 MED ORDER — LEVETIRACETAM IN NACL 500 MG/100ML IV SOLN: 500.0000 mg | INTRAVENOUS | Status: DC

## 2014-05-14 MED ORDER — PIPERACILLIN-TAZOBACTAM 3.375 G IVPB 30 MIN: 3.3750 g | Freq: Three times a day (TID) | INTRAVENOUS | Status: DC

## 2014-05-14 MED ORDER — PANTOPRAZOLE SODIUM 40 MG IV SOLR
40.0000 mg | Freq: Every day | INTRAVENOUS | Status: DC
  Administered 2014-05-14 – 2014-05-21 (×8): 40 mg via INTRAVENOUS
  Filled 2014-05-14 (×9): qty 40

## 2014-05-14 NOTE — ED Notes (Signed)
Pt caretaker at group home states that pt has been altered since yesterday. Caretakers were able to get pt to swallow yesterday but has had difficulty and is scheduled for a swallow study. Pt is non-responsive to verbal commands but is responsive to pain stimuli.

## 2014-05-14 NOTE — ED Notes (Signed)
Main lab at bedside to collect  chem 8

## 2014-05-14 NOTE — ED Notes (Signed)
Dawn [name], EDP made aware of CG4 Lactic results.

## 2014-05-14 NOTE — H&P (Signed)
PCP:  Dawn [name]  GI  Chief Complaint:  Stopped eating  HPI: Dawn [name] is a 68 y.o. female   has a past medical history of Seizures; Osteoporosis; Thyroid disease; High cholesterol; Cataracts, bilateral; Optic atrophy; Paraparesis; Hypothyroidism; Anginal pain; and Coronary artery disease.   Presented with  Baseline mental retardation wheelchair bound and non-verbal. Patient have stopped eating for the past few days seems to be unable to swallow. Nursing stuff denies any fever or chills. She has been pocketing food in her mouth. Reportedly had dental visit last week. Reportedly she had similar episodes in the past and recovered.  Upon arrival to emerge department patient was noted to be hypotensive down to 70s. She was given IV fluids and after 2 L blood pressure improved to her baseline of 110s. Sister at bedside discussed goals of care. Family wishes for her to be limited code. Patient appears to have urinary tract infection leading to sepsis. In emergency department Zosyn and vancomycin have been started. She is suspected to have aspiration although chest x-ray at this point is clear. Patient has been on mechanical soft diet for years with thin fluids for the past few days she was switched to nectar thick due to questions if she was aspirating. The past 24 hours she have stopped eating and drinking altogether.  Per group home 2 weeks ago patient was treated for mild pneumonia.  Hospitalist was called for admission for  sepsis likely secondary to UTI with possible severe dysphasia.  Review of Systems:   Unable to obtain  Constitutional:  No weight loss, night sweats, Fevers, chills, fatigue, weight loss  HEENT:  No headaches, Difficulty swallowing,Tooth/dental problems,Sore throat,  No sneezing, itching, ear ache, nasal congestion, post nasal drip,  Cardio-vascular:  No chest pain, Orthopnea, PND, anasarca, dizziness, palpitations.no Bilateral lower extremity  swelling  GI:  No heartburn, indigestion, abdominal pain, nausea, vomiting, diarrhea, change in bowel habits, loss of appetite, melena, blood in stool, hematemesis Resp:  no shortness of breath at rest. No dyspnea on exertion, No excess mucus, no productive cough, No non-productive cough, No coughing up of blood.No change in color of mucus.No wheezing. Skin:  no rash or lesions. No jaundice GU:  no dysuria, change in color of urine, no urgency or frequency. No straining to urinate.  No flank pain.  Musculoskeletal:  No joint pain or no joint swelling. No decreased range of motion. No back pain.  Psych:  No change in mood or affect. No depression or anxiety. No memory loss.  Neuro: no localizing neurological complaints, no tingling, no weakness, no double vision, no gait abnormality, no slurred speech, no confusion  Otherwise ROS are negative except for above, 10 systems were reviewed  Past Medical History: Past Medical History  Diagnosis Date  . Seizures   . Osteoporosis   . Thyroid disease     hypothyroid  . High cholesterol   . Cataracts, bilateral   . Optic atrophy   . Paraparesis     mild  . Hypothyroidism   . Anginal pain   . Coronary artery disease    Past Surgical History  Procedure Laterality Date  . Midline incision       Medications: Prior to Admission medications   Medication Sig Start Date End Date Taking? Authorizing Provider  aspirin 81 MG chewable tablet Chew 81 mg by mouth daily.   Yes Historical Provider, MD  atorvastatin (LIPITOR) 10 MG tablet Take 10 mg by mouth daily.  Yes Historical Provider, MD  calcium-vitamin D (OYSTER CALCIUM 500 + D) 500-200 MG-UNIT per tablet Take 1 tablet by mouth daily.   Yes Historical Provider, MD  divalproex (DEPAKOTE ER) 250 MG 24 hr tablet Take 750 mg by mouth 3 (three) times daily.   Yes Historical Provider, MD  levETIRAcetam (KEPPRA) 500 MG tablet Take 750 mg by mouth 2 (two) times daily.   Yes Historical Provider,  MD  levothyroxine (SYNTHROID, LEVOTHROID) 75 MCG tablet Take 75 mcg by mouth daily.   Yes Historical Provider, MD  LORazepam (ATIVAN) 2 MG tablet Take 2 mg by mouth See admin instructions. Take 2mg  1hour prior to dental procedure   Yes Historical Provider, MD  polyethylene glycol (MIRALAX / GLYCOLAX) packet Take 17 g by mouth daily.   Yes Historical Provider, MD  vitamin C (ASCORBIC ACID) 500 MG tablet Take 500 mg by mouth 3 (three) times daily.    Yes Historical Provider, MD    Allergies:   Allergies  Allergen Reactions  . Carvedilol   . Ppd [Tuberculin Purified Protein Derivative]     Per [facility]    Social History:  Ambulatory wheelchair bound   From facility [facility]  Dawn [name] [phone] Sister   reports that she has never smoked. She has never used smokeless tobacco. She reports that she does not drink alcohol or use illicit drugs.    Family History: family history includes Colon cancer in her mother; Diabetes type II in her sister.    Physical Exam: Patient Vitals for the past 24 hrs:  BP Temp Temp src Pulse Resp SpO2  05/14/14 1830 111/41 mmHg - - 90 22 100 %  05/14/14 1810 108/77 mmHg - - 91 18 99 %  05/14/14 1800 97/40 mmHg - - 95 15 98 %  05/14/14 1750 94/61 mmHg - - - - -  05/14/14 1740 92/73 mmHg - - - 18 -  05/14/14 1649 90/69 mmHg 99 F (37.2 C) Rectal 119 20 99 %  05/14/14 1617 112/94 mmHg 98.7 F (37.1 C) Oral 87 17 96 %    1. General:  in No Acute distress 2. Psychological: Alert but not verbal 3. Head/ENT:   Moist Mucous Membranes                          Head Non traumatic, neck supple                          very Poor Dentition with eroded teeth down to the gumline 4. SKIN: decreased Skin turgor,  Skin clean Dry and intact no rash 5. Heart: Regular rate and rhythm no Murmur, Rub or gallop 6. Lungs:, no wheezes or crackles   7. Abdomen: Soft, non-tender, Non distended 8. Lower extremities: no clubbing, cyanosis, or  edema 9. Neurologically Grossly intact, moving all 4 extremities equally not cooperative with exam 10. MSK: Normal range of motion  body mass index is unknown because there is no weight on file.   Labs on Admission:   Recent Labs  05/14/14 1912  NA 167*  K 4.5  CL 128*  GLUCOSE 71  BUN 44*  CREATININE 1.40*   No results found for this basename: AST, ALT, ALKPHOS, BILITOT, PROT, ALBUMIN,  in the last 72 hours No results found for this basename: LIPASE, AMYLASE,  in the last 72 hours  Recent Labs  05/14/14 1746 05/14/14 1912  WBC 27.2*  --   NEUTROABS 20.9*  --   HGB 9.3* 7.5*  HCT 29.6* 22.0*  MCV 97.7  --   PLT 159  --    No results found for this basename: CKTOTAL, CKMB, CKMBINDEX, TROPONINI,  in the last 72 hours No results found for this basename: TSH, T4TOTAL, FREET3, T3FREE, THYROIDAB,  in the last 72 hours No results found for this basename: VITAMINB12, FOLATE, FERRITIN, TIBC, IRON, RETICCTPCT,  in the last 72 hours No results found for this basename: HGBA1C    The CrCl is unknown because both a height and weight (above a minimum accepted value) are required for this calculation. ABG    Component Value Date/Time   TCO2 27 05/14/2014 1912     No results found for this basename: DDIMER    ECHO January 2015 Study Conclusions  - Procedure narrative: Transthoracic echocardiography. Image quality was poor. The study was technically difficult, as a result of poor acoustic windows, poor patient compliance, and restricted patient mobility. - Left ventricle: The cavity size was normal. There was mild focal basal hypertrophy of the septum. Systolic function was normal. The estimated ejection fraction was in the range of 60% to 65%. Wall motion was normal; there were no regional wall motion abnormalities. Features are consistent with a pseudonormal left ventricular filling pattern, with concomitant abnormal relaxation and increased filling pressure (grade 2 diastolic  dysfunction).   Other results:  I have pearsonaly reviewed this: ECG REPORT  Rate: 119   Rhythm: ST ST&T Change: repolarization abnormality   UA too numerous to count WBC  BNP (last 3 results)  Recent Labs  10/28/13 1547  PROBNP 488.2*    There were no vitals filed for this visit.   Cultures:    Component Value Date/Time   SDES BLOOD HAND RIGHT 12/15/2013 1624   SPECREQUEST BOTTLES DRAWN AEROBIC AND ANAEROBIC 10CC 12/15/2013 1624   CULT  Value: NO GROWTH 5 DAYS Performed at [facility] 12/15/2013 1624   REPTSTATUS 12/21/2013 FINAL 12/15/2013 1624      Radiological Exams on Admission: Dg Chest Portable 1 View  05/14/2014   CLINICAL DATA:  Decreased responsiveness  EXAM: PORTABLE CHEST - 1 VIEW  COMPARISON:  12/18/2013  FINDINGS: The heart size and mediastinal contours are within normal limits. Both lungs are clear. The visualized skeletal structures are unremarkable.  IMPRESSION: No active disease.   Electronically Signed   By: Inez Catalina M.D.   On: 05/14/2014 18:21    Chart has been reviewed  Assessment/Plan 68 yo F with mental retardation here with sepsis due to UTI and possible aspiration, severe dehydration due to poor by mouth intake likely secondary to severe tooth decay resulted in hypernatremia.   Present on Admission:  . Sepsis - admit to step down, give IV fluids, blood cultures, urine culture, cover with vancomycin and Zosyn. Repeat lactic acid  . Diastolic heart failure, NYHA class 2 - well compensated baseline currently appears to be dehydrated  . Paroxysmal a-fib - currently appears to be in sinus rhythm at baseline not on anticoagulation given anemia to hold off on anticoagulation for now  . Dysphasia - speech pathology evaluation in a.m. the likely need tooth care question tooth extraction. If no improvement with treatment of sepsis and rehydration may need tube feeding due to severe malnutrition  Severe protein malnutrition likely but poor by mouth  intake. Will need dietary consult once by mouth intake can be tolerated and or if tube feeding started  .  Anemia - obtain anemia panel and Hemoccult stool . UTI (lower urinary tract infection) - await results of urine culture for now cover with Zosyn  . Tooth decay- likely contributing to poor by mouth intake will need to be addressed  . Dehydration IV fluids with half normal saline D5 given hypernatremia  . Hypernatremia -likely due to poor by mouth intake and dehydration, will volume resuscitate the D5 half normal saline at 125 monitor frequent sodium adjust as needed avoid rapid correction History of seizure disorder. Will continue Keppra and Depakote IV per pharmacy  Prophylaxis: SCD hold off on Lovenox until bleeding excluded given anemia , Protonix  CODE STATUS:  limited code as per sister patient is okay to be defibrillated, IV pressors administered on having central axis otherwise  Other plan as per orders.  I have spent a total of 80 min on this admission extra time taken to discuss care with pharmacy, and PCCM  [place] 05/14/2014, 7:22 PM  [facility]  Pager [phone]   If 7AM-7PM, please contact the day team taking care of the patient  [email or web address]  Password [email or web address]

## 2014-05-14 NOTE — ED Notes (Signed)
Attempted IV access twice with no success. Dr Darl Householder notified

## 2014-05-14 NOTE — ED Notes (Addendum)
Per group home staff. Pt has become less interactive over past two days. Yesterday they had to hold head up to get pt to swallow. Pt responds to sternal rub, but not verbal stimuli. Recently treated for pnuemonia

## 2014-05-14 NOTE — ED Provider Notes (Signed)
CSN: [id number]     Arrival date & time 05/14/14  1609 History   First MD Initiated Contact with Patient 05/14/14 1627     Chief Complaint  Patient presents with  . Altered Mental Status     (Consider location/radiation/quality/duration/timing/severity/associated sxs/prior Treatment) The history is provided by a caregiver.  Dawn [name] is a 68 y.o. female hx of seizure, HL, CAD, mental retardation who is wheelchair bound here with AMS. Patient resides at [facility] group home. Has dysphagia at baseline. She wasn't able to swallow since yesterday. She was also noted to be less responsive. Per the aid, no vomiting but has been spitting up all food. She was recently given azithromycin for sinusitis.   Level V caveat- condition of patient    Past Medical History  Diagnosis Date  . Seizures   . Osteoporosis   . Thyroid disease     hypothyroid  . High cholesterol   . Cataracts, bilateral   . Optic atrophy   . Paraparesis     mild  . Hypothyroidism   . Anginal pain   . Coronary artery disease    Past Surgical History  Procedure Laterality Date  . Midline incision     History reviewed. No pertinent family history. History  Substance Use Topics  . Smoking status: Never Smoker   . Smokeless tobacco: Never Used  . Alcohol Use: No   OB History   Grav Para Term Preterm Abortions TAB SAB Ect Mult Living   0              Review of Systems  Unable to perform ROS: Mental status change      Allergies  Carvedilol and Ppd  Home Medications   Prior to Admission medications   Medication Sig Start Date End Date Taking? Authorizing Provider  aspirin 81 MG chewable tablet Chew 81 mg by mouth daily.   Yes Historical Provider, MD  atorvastatin (LIPITOR) 10 MG tablet Take 10 mg by mouth daily.   Yes Historical Provider, MD  calcium-vitamin D (OYSTER CALCIUM 500 + D) 500-200 MG-UNIT per tablet Take 1 tablet by mouth daily.   Yes Historical Provider, MD  divalproex (DEPAKOTE ER) 250  MG 24 hr tablet Take 750 mg by mouth 3 (three) times daily.   Yes Historical Provider, MD  levETIRAcetam (KEPPRA) 500 MG tablet Take 750 mg by mouth 2 (two) times daily.   Yes Historical Provider, MD  levothyroxine (SYNTHROID, LEVOTHROID) 75 MCG tablet Take 75 mcg by mouth daily.   Yes Historical Provider, MD  LORazepam (ATIVAN) 2 MG tablet Take 2 mg by mouth See admin instructions. Take 2mg  1hour prior to dental procedure   Yes Historical Provider, MD  polyethylene glycol (MIRALAX / GLYCOLAX) packet Take 17 g by mouth daily.   Yes Historical Provider, MD  vitamin C (ASCORBIC ACID) 500 MG tablet Take 500 mg by mouth 3 (three) times daily.    Yes Historical Provider, MD   BP 111/41  Pulse 90  Temp(Src) 99 F (37.2 C) (Rectal)  Resp 22  SpO2 100% Physical Exam  Nursing note and vitals reviewed. Constitutional:  Ill appearing   HENT:  Head: Normocephalic.  MM dry, dry food in mouth. Lower lip swollen (chronic)   Eyes: Conjunctivae are normal. Pupils are equal, round, and reactive to light.  Neck: Normal range of motion. Neck supple.  Cardiovascular: Regular rhythm and normal heart sounds.   tachy  Pulmonary/Chest:  Dec breath sounds bilaterally   Abdominal: Soft.  Bowel sounds are normal. She exhibits no distension. There is no tenderness. There is no rebound.  Musculoskeletal: Normal range of motion. She exhibits no edema.  Neurological:  Poorly responsive. Wakes up to sternal rub. Contracted.   Skin: Skin is dry.  Psychiatric:  Unable     ED Course  Procedures (including critical care time)  Angiocath insertion Performed by: Darl Householder, DAVID  Consent: Verbal consent obtained. Risks and benefits: risks, benefits and alternatives were discussed Time out: Immediately prior to procedure a "time out" was called to verify the correct patient, procedure, equipment, support staff and site/side marked as required.  Preparation: Patient was prepped and draped in the usual sterile  fashion.  Vein Location: L brachial  Ultrasound Guided  Gauge: 20 long   Normal blood return and flush without difficulty Patient tolerance: Patient tolerated the procedure well with no immediate complications.   CRITICAL CARE Performed by: Darl Householder, DAVID   Total critical care time: 30 min   Critical care time was exclusive of separately billable procedures and treating other patients.  Critical care was necessary to treat or prevent imminent or life-threatening deterioration.  Critical care was time spent personally by me on the following activities: development of treatment plan with patient and/or surrogate as well as nursing, discussions with consultants, evaluation of patient's response to treatment, examination of patient, obtaining history from patient or surrogate, ordering and performing treatments and interventions, ordering and review of laboratory studies, ordering and review of radiographic studies, pulse oximetry and re-evaluation of patient's condition.   Labs Review Labs Reviewed  CBC WITH DIFFERENTIAL - Abnormal; Notable for the following:    WBC 27.2 (*)    RBC 3.03 (*)    Hemoglobin 9.3 (*)    HCT 29.6 (*)    RDW 19.5 (*)    Lymphocytes Relative 11 (*)    Neutro Abs 20.9 (*)    Monocytes Absolute 3.0 (*)    Basophils Absolute 0.3 (*)    All other components within normal limits  URINALYSIS, ROUTINE W REFLEX MICROSCOPIC - Abnormal; Notable for the following:    APPearance TURBID (*)    Hgb urine dipstick LARGE (*)    Protein, ur 30 (*)    Nitrite POSITIVE (*)    Leukocytes, UA LARGE (*)    All other components within normal limits  I-STAT CG4 LACTIC ACID, ED - Abnormal; Notable for the following:    Lactic Acid, Venous 4.51 (*)    All other components within normal limits  I-STAT CHEM 8, ED - Abnormal; Notable for the following:    Sodium 167 (*)    Chloride 128 (*)    BUN 44 (*)    Creatinine, Ser 1.40 (*)    Calcium, Ion 1.08 (*)    Hemoglobin 7.5  (*)    HCT 22.0 (*)    All other components within normal limits  URINE CULTURE  CULTURE, BLOOD (ROUTINE X 2)  CULTURE, BLOOD (ROUTINE X 2)  VALPROIC ACID LEVEL  URINE MICROSCOPIC-ADD ON  COMPREHENSIVE METABOLIC PANEL  I-STAT TROPOININ, ED    Imaging Review Dg Chest Portable 1 View  05/14/2014   CLINICAL DATA:  Decreased responsiveness  EXAM: PORTABLE CHEST - 1 VIEW  COMPARISON:  12/18/2013  FINDINGS: The heart size and mediastinal contours are within normal limits. Both lungs are clear. The visualized skeletal structures are unremarkable.  IMPRESSION: No active disease.   Electronically Signed   By: Inez Catalina M.D.   On: 05/14/2014 18:21  EKG Interpretation   Date/Time:  Friday May 14 2014 16:49:49 EDT Ventricular Rate:  119 PR Interval:  99 QRS Duration: 71 QT Interval:  306 QTC Calculation: 430 R Axis:   44 Text Interpretation:  Sinus tachycardia Abnormal R-wave progression, early  transition Repol abnrm suggests ischemia, diffuse leads Since last tracing  rate faster Confirmed by YAO  MD, DAVID ([id number]) on 05/14/2014 4:58:54 PM      MDM   Final diagnoses:  None    Dawn [name] is a 68 y.o. female here with AMS. Hypotensive, tachy. Concerned for severe sepsis. Will do sepsis workup. Will need admission.   7:17 PM BP improved to 108/80 after 2 L NS. Clinically has aspiration pneumonia but CXR clear. Does have UTI. Given vanc/zosyn. WBC 27, lactate 4. Likely has severe sepsis. Also dehydrated with Na 167. Will admit to step down.    Wandra Arthurs, MD 05/14/14 [phone]

## 2014-05-14 NOTE — Progress Notes (Signed)
ANTIBIOTIC CONSULT NOTE - INITIAL  Pharmacy Consult for vancomycin Indication: rule out pneumonia and rule out sepsis  Allergies  Allergen Reactions  . Carvedilol   . Ppd [Tuberculin Purified Protein Derivative]     Per [facility]    Patient Measurements: Height: 5' 1.81" (157 cm) Weight: 125 lb 3.5 oz (56.8 kg) IBW/kg (Calculated) : 49.67  Vital Signs: Temp: 99 F (37.2 C) (07/31 1649) Temp src: Rectal (07/31 1649) BP: 111/41 mmHg (07/31 1830) Pulse Rate: 90 (07/31 1830)  Labs:  Recent Labs  05/14/14 1746 05/14/14 1905 05/14/14 1912  WBC 27.2*  --   --   HGB 9.3*  --  7.5*  PLT 159  --   --   CREATININE  --  1.38* 1.40*   Estimated Creatinine Clearance: 30.6 ml/min (by C-G formula based on Cr of 1.4).  Medical History: Past Medical History  Diagnosis Date  . Seizures   . Osteoporosis   . Thyroid disease     hypothyroid  . High cholesterol   . Cataracts, bilateral   . Optic atrophy   . Paraparesis     mild  . Hypothyroidism   . Anginal pain   . Coronary artery disease     Medications:  Prescriptions prior to admission  Medication Sig Dispense Refill  . aspirin 81 MG chewable tablet Chew 81 mg by mouth daily.      Marland Kitchen atorvastatin (LIPITOR) 10 MG tablet Take 10 mg by mouth daily.      . calcium-vitamin D (OYSTER CALCIUM 500 + D) 500-200 MG-UNIT per tablet Take 1 tablet by mouth daily.      . divalproex (DEPAKOTE ER) 250 MG 24 hr tablet Take 750 mg by mouth 3 (three) times daily.      Marland Kitchen levETIRAcetam (KEPPRA) 500 MG tablet Take 500-750 mg by mouth 3 (three) times daily. 500 mg at 0800, 750 mg at 1400 and 750 mg at 2000      . levothyroxine (SYNTHROID, LEVOTHROID) 75 MCG tablet Take 75 mcg by mouth daily.      Marland Kitchen LORazepam (ATIVAN) 2 MG tablet Take 2 mg by mouth See admin instructions. Take 2mg  1hour prior to dental procedure      . polyethylene glycol (MIRALAX / GLYCOLAX) packet Take 17 g by mouth daily.      . vitamin C (ASCORBIC ACID) 500 MG tablet Take 500 mg  by mouth 3 (three) times daily.        Scheduled:  . levETIRAcetam  750 mg Intravenous 2 times per day  . [START ON 05/15/2014] levETIRAcetam  500 mg Intravenous Q24H  . levothyroxine  37.5 mcg Intravenous Daily  . pantoprazole (PROTONIX) IV  40 mg Intravenous QHS  . piperacillin-tazobactam (ZOSYN)  IV  3.375 g Intravenous 3 times per day  . sodium chloride  1,000 mL Intravenous Once   Infusions:  . dextrose 5 % and 0.45% NaCl 1,000 mL (05/14/14 2103)    Assessment: 68yo female recently tx'd for PNA now less interactive w/ group home staff over past two days, newest CXR clear, to begin IV ABX for possible sepsis d/t UTI and possible aspiration.  Goal of Therapy:  Vancomycin trough level 15-20 mcg/ml  Plan:  Rec'd vanc 1g at [facility]; will continue with vancomycin 750mg  IV Q24H and monitor CBC, Cx, levels prn.  Wynona Neat, PharmD, BCPS  05/14/2014,11:26 PM

## 2014-05-15 ENCOUNTER — Inpatient Hospital Stay (HOSPITAL_COMMUNITY): Payer: Medicare Other

## 2014-05-15 ENCOUNTER — Encounter (HOSPITAL_COMMUNITY): Payer: Self-pay | Admitting: Internal Medicine

## 2014-05-15 DIAGNOSIS — R6521 Severe sepsis with septic shock: Secondary | ICD-10-CM

## 2014-05-15 DIAGNOSIS — A419 Sepsis, unspecified organism: Secondary | ICD-10-CM

## 2014-05-15 DIAGNOSIS — G40909 Epilepsy, unspecified, not intractable, without status epilepticus: Secondary | ICD-10-CM

## 2014-05-15 DIAGNOSIS — I959 Hypotension, unspecified: Secondary | ICD-10-CM

## 2014-05-15 DIAGNOSIS — I5032 Chronic diastolic (congestive) heart failure: Secondary | ICD-10-CM

## 2014-05-15 DIAGNOSIS — I4891 Unspecified atrial fibrillation: Secondary | ICD-10-CM

## 2014-05-15 HISTORY — PX: CENTRAL VENOUS CATHETER INSERTION: SHX401

## 2014-05-15 LAB — CBC WITH DIFFERENTIAL/PLATELET
BASOS ABS: 0 10*3/uL (ref 0.0–0.1)
BASOS ABS: 0.2 10*3/uL — AB (ref 0.0–0.1)
Basophils Relative: 0 % (ref 0–1)
Basophils Relative: 1 % (ref 0–1)
Eosinophils Absolute: 0 10*3/uL (ref 0.0–0.7)
Eosinophils Absolute: 0 10*3/uL (ref 0.0–0.7)
Eosinophils Relative: 0 % (ref 0–5)
Eosinophils Relative: 0 % (ref 0–5)
HCT: 39.8 % (ref 36.0–46.0)
HEMATOCRIT: 17.3 % — AB (ref 36.0–46.0)
Hemoglobin: 5.2 g/dL — CL (ref 12.0–15.0)
Hemoglobin: 13.5 g/dL (ref 12.0–15.0)
LYMPHS PCT: 7 % — AB (ref 12–46)
Lymphocytes Relative: 12 % (ref 12–46)
Lymphs Abs: 1.8 10*3/uL (ref 0.7–4.0)
Lymphs Abs: 1.4 10*3/uL (ref 0.7–4.0)
MCH: 29.9 pg (ref 26.0–34.0)
MCH: 30.5 pg (ref 26.0–34.0)
MCHC: 30.1 g/dL (ref 30.0–36.0)
MCHC: 33.9 g/dL (ref 30.0–36.0)
MCV: 99.4 fL (ref 78.0–100.0)
MCV: 90 fL (ref 78.0–100.0)
MONOS PCT: 2 % — AB (ref 3–12)
Monocytes Absolute: 0.9 10*3/uL (ref 0.1–1.0)
Monocytes Absolute: 0.4 10*3/uL (ref 0.1–1.0)
Monocytes Relative: 6 % (ref 3–12)
NEUTROS ABS: 12.7 10*3/uL — AB (ref 1.7–7.7)
Neutro Abs: 17.3 10*3/uL — ABNORMAL HIGH (ref 1.7–7.7)
Neutrophils Relative %: 82 % — ABNORMAL HIGH (ref 43–77)
Neutrophils Relative %: 90 % — ABNORMAL HIGH (ref 43–77)
PLATELETS: 80 10*3/uL — AB (ref 150–400)
Platelets: 70 10*3/uL — ABNORMAL LOW (ref 150–400)
RBC: 1.74 MIL/uL — ABNORMAL LOW (ref 3.87–5.11)
RBC: 4.42 MIL/uL (ref 3.87–5.11)
RDW: 18.9 % — ABNORMAL HIGH (ref 11.5–15.5)
RDW: 16.8 % — AB (ref 11.5–15.5)
WBC Morphology: INCREASED
WBC: 15.4 10*3/uL — AB (ref 4.0–10.5)
WBC: 19.3 10*3/uL — AB (ref 4.0–10.5)

## 2014-05-15 LAB — COMPREHENSIVE METABOLIC PANEL
ALBUMIN: 2.2 g/dL — AB (ref 3.5–5.2)
ALK PHOS: 70 U/L (ref 39–117)
ALK PHOS: 92 U/L (ref 39–117)
ALT: 12 U/L (ref 0–35)
ALT: 18 U/L (ref 0–35)
ANION GAP: 8 (ref 5–15)
ANION GAP: 16 — AB (ref 5–15)
AST: 32 U/L (ref 0–37)
AST: 36 U/L (ref 0–37)
Albumin: 1.8 g/dL — ABNORMAL LOW (ref 3.5–5.2)
BUN: 30 mg/dL — AB (ref 6–23)
BUN: 28 mg/dL — AB (ref 6–23)
CO2: 24 meq/L (ref 19–32)
CO2: 25 mEq/L (ref 19–32)
Calcium: 7.3 mg/dL — ABNORMAL LOW (ref 8.4–10.5)
Calcium: 7.9 mg/dL — ABNORMAL LOW (ref 8.4–10.5)
Chloride: 130 mEq/L — ABNORMAL HIGH (ref 96–112)
Chloride: 123 mEq/L — ABNORMAL HIGH (ref 96–112)
Creatinine, Ser: 1.24 mg/dL — ABNORMAL HIGH (ref 0.50–1.10)
Creatinine, Ser: 1.25 mg/dL — ABNORMAL HIGH (ref 0.50–1.10)
GFR calc Af Amer: 51 mL/min — ABNORMAL LOW (ref >90)
GFR calc Af Amer: 50 mL/min — ABNORMAL LOW (ref >90)
GFR calc non Af Amer: 44 mL/min — ABNORMAL LOW (ref >90)
GFR calc non Af Amer: 43 mL/min — ABNORMAL LOW (ref >90)
GLUCOSE: 148 mg/dL — AB (ref 70–99)
Glucose, Bld: 195 mg/dL — ABNORMAL HIGH (ref 70–99)
POTASSIUM: 3.6 meq/L — AB (ref 3.7–5.3)
Potassium: 3.7 mEq/L (ref 3.7–5.3)
SODIUM: 164 meq/L — AB (ref 137–147)
Sodium: 162 mEq/L — ABNORMAL HIGH (ref 137–147)
TOTAL PROTEIN: 5.6 g/dL — AB (ref 6.0–8.3)
Total Bilirubin: 0.3 mg/dL (ref 0.3–1.2)
Total Bilirubin: 1.2 mg/dL (ref 0.3–1.2)
Total Protein: 7 g/dL (ref 6.0–8.3)

## 2014-05-15 LAB — BASIC METABOLIC PANEL
BUN: 33 mg/dL — ABNORMAL HIGH (ref 6–23)
CO2: 26 meq/L (ref 19–32)
Calcium: 7.3 mg/dL — ABNORMAL LOW (ref 8.4–10.5)
Chloride: >130 mEq/L (ref 96–112)
Creatinine, Ser: 1.28 mg/dL — ABNORMAL HIGH (ref 0.50–1.10)
GFR calc Af Amer: 49 mL/min — ABNORMAL LOW (ref >90)
GFR, EST NON AFRICAN AMERICAN: 42 mL/min — AB (ref >90)
Glucose, Bld: 88 mg/dL (ref 70–99)
POTASSIUM: 3.8 meq/L (ref 3.7–5.3)
SODIUM: 166 meq/L — AB (ref 137–147)

## 2014-05-15 LAB — BLOOD GAS, ARTERIAL
ACID-BASE DEFICIT: 3.3 mmol/L — AB (ref 0.0–2.0)
Bicarbonate: 21.3 mEq/L (ref 20.0–24.0)
Drawn by: 246861
FIO2: 0.21 %
O2 SAT: 98.1 %
PO2 ART: 113 mmHg — AB (ref 80.0–100.0)
Patient temperature: 98.6
TCO2: 22.6 mmol/L (ref 0–100)
pCO2 arterial: 39.5 mmHg (ref 35.0–45.0)
pH, Arterial: 7.353 (ref 7.350–7.450)

## 2014-05-15 LAB — TROPONIN I: Troponin I: <0.3 ng/mL (ref <0.30)

## 2014-05-15 LAB — PROTIME-INR
INR: 1.46 (ref 0.00–1.49)
PROTHROMBIN TIME: 17.7 s — AB (ref 11.6–15.2)

## 2014-05-15 LAB — MRSA PCR SCREENING: MRSA by PCR: NEGATIVE

## 2014-05-15 LAB — PROCALCITONIN: Procalcitonin: 2.15 ng/mL

## 2014-05-15 LAB — VALPROIC ACID LEVEL: VALPROIC ACID LVL: 63.8 ug/mL (ref 50.0–100.0)

## 2014-05-15 LAB — ALBUMIN: ALBUMIN: 2 g/dL — AB (ref 3.5–5.2)

## 2014-05-15 LAB — PREPARE RBC (CROSSMATCH)

## 2014-05-15 LAB — RETICULOCYTES
RBC.: 1.74 MIL/uL — ABNORMAL LOW (ref 3.87–5.11)
RETIC CT PCT: 1.1 % (ref 0.4–3.1)
Retic Count, Absolute: 19.1 10*3/uL (ref 19.0–186.0)

## 2014-05-15 LAB — IRON AND TIBC
Iron: 38 ug/dL — ABNORMAL LOW (ref 42–135)
Saturation Ratios: 54 % (ref 20–55)
TIBC: 71 ug/dL — ABNORMAL LOW (ref 250–470)
UIBC: 33 ug/dL — ABNORMAL LOW (ref 125–400)

## 2014-05-15 LAB — FOLATE: Folate: 8.5 ng/mL

## 2014-05-15 LAB — VITAMIN B12: Vitamin B-12: 1503 pg/mL — ABNORMAL HIGH (ref 211–911)

## 2014-05-15 LAB — FERRITIN: FERRITIN: 1878 ng/mL — AB (ref 10–291)

## 2014-05-15 LAB — LACTIC ACID, PLASMA: Lactic Acid, Venous: 2.9 mmol/L — ABNORMAL HIGH (ref 0.5–2.2)

## 2014-05-15 LAB — STREP PNEUMONIAE URINARY ANTIGEN: Strep Pneumo Urinary Antigen: NEGATIVE

## 2014-05-15 MED ORDER — DOPAMINE-DEXTROSE 3.2-5 MG/ML-% IV SOLN
5.0000 ug/kg/min | INTRAVENOUS | Status: DC
  Administered 2014-05-15: 5 ug/kg/min via INTRAVENOUS
  Filled 2014-05-15: qty 250

## 2014-05-15 MED ORDER — CHLORHEXIDINE GLUCONATE 0.12 % MT SOLN
15.0000 mL | Freq: Two times a day (BID) | OROMUCOSAL | Status: DC
  Administered 2014-05-15 – 2014-06-09 (×52): 15 mL via OROMUCOSAL
  Filled 2014-05-15 (×56): qty 15

## 2014-05-15 MED ORDER — VALPROATE SODIUM 500 MG/5ML IV SOLN
750.0000 mg | Freq: Three times a day (TID) | INTRAVENOUS | Status: DC
  Administered 2014-05-15 – 2014-05-22 (×22): 750 mg via INTRAVENOUS
  Filled 2014-05-15 (×25): qty 7.5

## 2014-05-15 MED ORDER — SODIUM CHLORIDE 0.9 % IV BOLUS (SEPSIS): 1000.0000 mL | Freq: Once | INTRAVENOUS | Status: DC

## 2014-05-15 MED ORDER — SODIUM CHLORIDE 0.9 % IV SOLN
Freq: Once | INTRAVENOUS | Status: AC
  Administered 2014-05-15: 13:00:00 via INTRAVENOUS

## 2014-05-15 MED ORDER — SODIUM CHLORIDE 0.9 % IV SOLN
Freq: Once | INTRAVENOUS | Status: AC
  Administered 2014-05-15: 11:00:00 via INTRAVENOUS

## 2014-05-15 MED ORDER — DEXTROSE-NACL 5-0.45 % IV SOLN
INTRAVENOUS | Status: DC
  Administered 2014-05-15 – 2014-05-16 (×5): via INTRAVENOUS

## 2014-05-15 MED ORDER — ALBUMIN HUMAN 25 % IV SOLN
12.5000 g | Freq: Once | INTRAVENOUS | Status: AC
Start: 2014-05-15 — End: 2014-05-15
  Administered 2014-05-15: 12.5 g via INTRAVENOUS
  Filled 2014-05-15: qty 50

## 2014-05-15 MED ORDER — ALBUMIN HUMAN 5 % IV SOLN
25.0000 g | Freq: Once | INTRAVENOUS | Status: AC
  Administered 2014-05-15: 25 g via INTRAVENOUS
  Filled 2014-05-15: qty 500

## 2014-05-15 MED ORDER — CETYLPYRIDINIUM CHLORIDE 0.05 % MT LIQD
7.0000 mL | Freq: Two times a day (BID) | OROMUCOSAL | Status: DC
Start: 2014-05-15 — End: 2014-06-09
  Administered 2014-05-15 – 2014-06-09 (×51): 7 mL via OROMUCOSAL

## 2014-05-15 MED ORDER — FUROSEMIDE 10 MG/ML IJ SOLN
20.0000 mg | Freq: Two times a day (BID) | INTRAMUSCULAR | Status: DC
  Administered 2014-05-15 – 2014-05-16 (×3): 20 mg via INTRAVENOUS
  Filled 2014-05-15 (×2): qty 2

## 2014-05-15 MED ORDER — DEXTROSE 5 % IV BOLUS
1000.0000 mL | Freq: Once | INTRAVENOUS | Status: AC
  Administered 2014-05-15: 1000 mL via INTRAVENOUS

## 2014-05-15 MED ORDER — SODIUM CHLORIDE 0.9 % IV BOLUS (SEPSIS)
1000.0000 mL | Freq: Once | INTRAVENOUS | Status: AC
  Administered 2014-05-15: 1000 mL via INTRAVENOUS

## 2014-05-15 MED ORDER — HYDROCORTISONE NA SUCCINATE PF 100 MG IJ SOLR
100.0000 mg | Freq: Three times a day (TID) | INTRAMUSCULAR | Status: AC
  Administered 2014-05-15 (×3): 100 mg via INTRAVENOUS
  Filled 2014-05-15 (×3): qty 2

## 2014-05-15 NOTE — Progress Notes (Addendum)
[facility] TEAM 1 - Stepdown/ICU TEAM Progress Note  Dawn [name] MBT:[id number] DOB: 05-14-1946 DOA: 05/14/2014 PCP: [name]  Admit HPI / Brief Narrative: Dawn [name] is a 68 y.o. BF PMHx Seizures; diastolic CHF, atrial fibrillation, pituitary microadenoma (diagnosed 2009) hypothyroidism, Osteoporosis; Thyroid disease; High cholesterol; Cataracts, bilateral; Optic atrophy; Paraparesis; Hypothyroidism; Anginal pain; and Coronary artery disease.  Presented with  Baseline mental retardation wheelchair bound and non-verbal. Patient have stopped eating for the past few days seems to be unable to swallow. Nursing stuff denies any fever or chills. She has been pocketing food in her mouth. Reportedly had dental visit last week. Reportedly she had similar episodes in the past and recovered.  Upon arrival to emerge department patient was noted to be hypotensive down to 70s. She was given IV fluids and after 2 L blood pressure improved to her baseline of 110s. Sister at bedside discussed goals of care. Family wishes for her to be limited code. Patient appears to have urinary tract infection leading to sepsis. In emergency department Zosyn and vancomycin have been started. She is suspected to have aspiration although chest x-ray at this point is clear.  Patient has been on mechanical soft diet for years with thin fluids for the past few days she was switched to nectar thick due to questions if she was aspirating. The past 24 hours she have stopped eating and drinking altogether.  Per group home 2 weeks ago patient was treated for mild pneumonia.  Hospitalist was called for admission for sepsis likely secondary to UTI with possible severe dysphasia.   HPI/Subjective: 8/1 A/O. x0, will open eyes to painful stimuli, will not follow directions  Assessment/Plan: Goals of care; -Spoke with [name] [phone] via phone who is patient's guardian has okayed all treatment modalities  required to include central venous line placement, blooSpoke with [name] [phone] d products. Has also stated that she would like patient code to be changed to DO NOT RESUSCITATE  Sepsis/septic shock -Patient has received 2 L fluid in the ED which has improved patient's BP. Currently MAP> 60 -Fluid change from D5 and 1/2 NS and asked to D5W bolus secondary to patient's hypernatremia. -At completion of 1 L bolus D5W will restart D5 1/2 NS at 150 ml/hr  Hypotension -Patient continues to be borderline hypotensive secondary to sepsis. -Dopamine drip at 5 mcg/ kilogram/min, for goal MAP > 65, until patient is a volume resuscitated. -PCCM. aware of patient if with fluid and blood resuscitation along with low-dose dopamine on able to maintain adequate MAP were transfer patient to ICU   Hypernatremia -Bolus 1 L D5W, then restart D5W 1/2NS at 120ml/hr  Symptomatic anemia -Transfuse 3 units PRBC -Obtain CBC at 0488  Chronic Diastolic heart failure  -Patient will need to be monitored closely for Fluid overload   Paroxysmal atrial fibrillation  - Currrently NSR   Dehydration  -Continue aggressive hydration, will have to monitor closely for fluid overload  Hypocalcemia -Corrected calcium = 9.6  Seizure disorder  -Continue Keppra 500 mg qAm and 750 mg  @ 1400 and 2000 -Obtain Keppra level  -Continue Depacon 750 mg TID -Obtain Depacon level    Code Status: DNR Family Communication: Spoke with [name] [phone] via phone who is patient's guardian on home. Disposition Plan: Resolution septic shock   Consultants: NA   Procedure/Significant Events: 1/15 echocardiogram - Left ventricle: mild focal basal hypertrophy of the septum. -LVEF= 60% to 65%.  -(grade 2 diastolic  dysfunction).     Culture 7/31 urine pending 7/31 blood pending 7/31 MRSA by PCR negative   Antibiotics: Zosyn 7/31>> Vancomycin 7/31>>   DVT  prophylaxis: SCD   Devices NA   LINES / TUBES:  7/31 ga left antecubital 8./1 22ga right forearm 8/1 20ga left EJ       Continuous Infusions:   Objective: VITAL SIGNS: Temp: 92.2 F (33.4 C) (08/01 0818) Temp src: Core (Comment) (08/01 0818) BP: 91/65 mmHg (08/01 0830) Pulse Rate: 57 (08/01 0830) SPO2; 100% on 2 L via [place] FIO2:   Intake/Output Summary (Last 24 hours) at 05/15/14 0859 Last data filed at 05/15/14 0820  Gross per 24 hour  Intake 1501.25 ml  Output    550 ml  Net 951.25 ml     Exam: General: Lethargic, arousable, does not follow commands, contracted into fetal position (per Ms. Sellers this is her baseline physician), No acute respiratory distress Lungs: Clear to auscultation bilaterally without wheezes or crackles Cardiovascular:  Bradycardic,Regular rhythm without murmur gallop or rub normal S1 and S2 Abdomen: Nontender, nondistended, soft, bowel sounds positive, no rebound, no ascites, no appreciable mass Extremities: No significant cyanosis, clubbing. +2+ pitting edema bilateral lower extremities  Data Reviewed: Basic Metabolic Panel:  Recent Labs Lab 05/14/14 1905 05/14/14 1912 05/15/14 0056 05/15/14 0600  NA 166* 167* 166* 162*  K 4.8 4.5 3.8 3.6*  CL 128* 128* >130* 130*  CO2 26  --  26 24  GLUCOSE 70 71 88 148*  BUN 38* 44* 33* 30*  CREATININE 1.38* 1.40* 1.28* 1.24*  CALCIUM 8.1*  --  7.3* 7.3*   Liver Function Tests:  Recent Labs Lab 05/14/14 1905 05/15/14 0600  AST 40* 32  ALT 13 12  ALKPHOS 108 70  BILITOT 0.3 0.3  PROT 6.9 5.6*  ALBUMIN 1.1* 1.8*   No results found for this basename: LIPASE, AMYLASE,  in the last 168 hours No results found for this basename: AMMONIA,  in the last 168 hours CBC:  Recent Labs Lab 05/14/14 1746 05/14/14 1912 05/15/14 0740  WBC 27.2*  --  15.4*  NEUTROABS 20.9*  --  12.7*  HGB 9.3* 7.5* 5.2*  HCT 29.6* 22.0* 17.3*  MCV 97.7  --  99.4  PLT 159  --  70*   Cardiac  Enzymes:  Recent Labs Lab 05/15/14 0056 05/15/14 0600  TROPONINI <0.30 <0.30   BNP (last 3 results)  Recent Labs  10/28/13 1547  PROBNP 488.2*   CBG: No results found for this basename: GLUCAP,  in the last 168 hours  Recent Results (from the past 240 hour(s))  MRSA PCR SCREENING     Status: None   Collection Time    05/14/14 10:29 PM      Result Value Ref Range Status   MRSA by PCR NEGATIVE  NEGATIVE Final   Comment:            The GeneXpert MRSA Assay (FDA     approved for NASAL specimens     only), is one component of a     comprehensive MRSA colonization     surveillance program. It is not     intended to diagnose MRSA     infection nor to guide or     monitor treatment for     MRSA infections.     Studies:  Recent x-ray studies have been reviewed in detail by the Attending Physician  Scheduled Meds:  Scheduled Meds: . sodium chloride   Intravenous  Once  . antiseptic oral rinse  7 mL Mouth Rinse BID  . chlorhexidine  15 mL Mouth Rinse BID  . hydrocortisone sod succinate (SOLU-CORTEF) inj  100 mg Intravenous Q8H  . levETIRAcetam  750 mg Intravenous 2 times per day  . levETIRAcetam  500 mg Intravenous Q24H  . levothyroxine  37.5 mcg Intravenous Daily  . pantoprazole (PROTONIX) IV  40 mg Intravenous QHS  . piperacillin-tazobactam (ZOSYN)  IV  3.375 g Intravenous 3 times per day  . vancomycin  750 mg Intravenous Q24H    Time spent on care of this patient: 40 mins   Allie Bossier , MD   [facility] Office  [phone] Pager [phone]  On-Call/Text Page:      [email or web address]      password [email or web address]  If 7PM-7AM, please contact night-coverage [email or web address] Password [email or web address] 05/15/2014, 8:59 AM   LOS: 1 day

## 2014-05-15 NOTE — Progress Notes (Signed)
MD is notified of pt's following vital signs: Temp (core): 92.7, HR: 56, SBP: 70s-80s. New orders received. Will continue to monitor and assess pt.

## 2014-05-15 NOTE — Progress Notes (Signed)
CRITICAL VALUE ALERT  Critical value received:  Hgb= 5.2  Date of notification:  05/15/14   Time of notification:  0812  Critical value read back:Yes.    Nurse who received alert:   Luretha Eberly   MD notified (1st page):  Dr. Sherral Hammers  Time of first page:  0815  MD notified (2nd page):  Time of second page:  Responding MD:  Dr. Sherral Hammers  Time MD responded:  [phone]

## 2014-05-15 NOTE — Progress Notes (Signed)
SLP Cancellation Note  Patient Details Name: Dawn [name] MRN: [id number] DOB: [birth year]   Cancelled treatment:       Reason Eval/Treat Not Completed: Patient not medically ready. Pt not responsive this a.m.; RN and MD in room reported pt not ready. Will reattempt tomorrow.    Kern Reap, MA, CCC-SLP 05/15/2014, 8:45 AM

## 2014-05-15 NOTE — Procedures (Deleted)
After explaining the risk and benefits over the phone to Dawn [name] [phone] permission was granted to place central venous access. The area was prepared and a sterile fashion using chlorhexidine, and sterile drape, all person maintain sterility during procedure. A timeout was conducted to confirm site of procedure. 1% lidocaine without epinephrine was for local anesthesia, and a left subclavian central venous line (triple-lumen) was successfully placed. A stat CXR was obtained in order to confirm placement, and line is certified for use. Minimal blood loss, patient tolerated procedure well.

## 2014-05-15 NOTE — Consult Note (Signed)
PULMONARY / CRITICAL CARE MEDICINE   Name: Dawn [name] MRN: [id number] DOB: [birth year]    ADMISSION DATE:  05/14/2014 CONSULTATION DATE:  05/15/2014   REFERRING MD :  Dr. Roel Cluck  CHIEF COMPLAINT:  Sepsis from a urinary source, hypotension  INITIAL PRESENTATION: Brought to [facility] from SNF with fever.  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  Found to have very dirty urine, elevated WBC count and hypotension.  Was bolused 2L saline in ED with good response in BP (510 systolic from 25E), transferred to step down and BPs decreased again.  Does respond transiently to fluids but has now received 4L NS with persistent hypotension.  Of note she is also hypernatremic and hyperchloremic.  She has not been eating or drinking well for the last few days.  She does have a history of aspiration and dysphagia.   PAST MEDICAL HISTORY :  Past Medical History  Diagnosis Date  . Seizures   . Osteoporosis   . Thyroid disease     hypothyroid  . High cholesterol   . Cataracts, bilateral   . Optic atrophy   . Paraparesis     mild  . Hypothyroidism   . Anginal pain   . Coronary artery disease    Past Surgical History  Procedure Laterality Date  . Midline incision     Prior to Admission medications   Medication Sig Start Date End Date Taking? Authorizing Provider  aspirin 81 MG chewable tablet Chew 81 mg by mouth daily.   Yes Historical Provider, MD  atorvastatin (LIPITOR) 10 MG tablet Take 10 mg by mouth daily.   Yes Historical Provider, MD  calcium-vitamin D (OYSTER CALCIUM 500 + D) 500-200 MG-UNIT per tablet Take 1 tablet by mouth daily.   Yes Historical Provider, MD  divalproex (DEPAKOTE ER) 250 MG 24 hr tablet Take 750 mg by mouth 3 (three) times daily.   Yes Historical Provider, MD  levETIRAcetam (KEPPRA) 500 MG tablet Take 500-750 mg by mouth 3 (three) times  daily. 500 mg at 0800, 750 mg at 1400 and 750 mg at 2000   Yes Historical Provider, MD  levothyroxine (SYNTHROID, LEVOTHROID) 75 MCG tablet Take 75 mcg by mouth daily.   Yes Historical Provider, MD  LORazepam (ATIVAN) 2 MG tablet Take 2 mg by mouth See admin instructions. Take 2mg  1hour prior to dental procedure   Yes Historical Provider, MD  polyethylene glycol (MIRALAX / GLYCOLAX) packet Take 17 g by mouth daily.   Yes Historical Provider, MD  vitamin C (ASCORBIC ACID) 500 MG tablet Take 500 mg by mouth 3 (three) times daily.    Yes Historical Provider, MD   Allergies  Allergen Reactions  . Carvedilol   . Ppd [Tuberculin Purified Protein Derivative]     Per MAR    FAMILY HISTORY:  Family History  Problem Relation Age of Onset  . Colon cancer Mother   . Diabetes type II Sister    SOCIAL HISTORY:  reports that she has never smoked. She has never used smokeless tobacco. She reports that she does not drink alcohol or use illicit drugs.  REVIEW OF SYSTEMS:  Pt is non-verbal, not able to obtain full review of systems  SUBJECTIVE:   VITAL SIGNS: Temp:  [94.1 F (34.5 C)-99 F (37.2 C)] 94.1 F (34.5 C) (08/01 0045) Pulse Rate:  [  63-119] 65 (08/01 0230) Resp:  [9-26] 14 (08/01 0230) BP: (66-112)/(40-94) 66/44 mmHg (08/01 0230) SpO2:  [96 %-100 %] 99 % (08/01 0230) Weight:  [56.8 kg (125 lb 3.5 oz)] 56.8 kg (125 lb 3.5 oz) (07/31 2300) HEMODYNAMICS:   VENTILATOR SETTINGS:   INTAKE / OUTPUT:  Intake/Output Summary (Last 24 hours) at 05/15/14 0348 Last data filed at 05/14/14 2200  Gross per 24 hour  Intake      0 ml  Output     50 ml  Net    -50 ml    PHYSICAL EXAMINATION: General:  Laying in bed, no acute distress, non-verbal Neuro:  Non-verbal, localizes to pain (with IV stick) HEENT:  PERRL, EOMI, microcephalic Cardiovascular:  NRRR, no MRG Lungs:  CTAB, no w/r/r Abdomen:  Soft, NTND, +BS, no HSM Musculoskeletal:  Contractures of arms and legs Skin:  Multiple  bruises at IV stick sites.  Pressure ulcers on left arm and leg dressed.   LABS:  CBC  Recent Labs Lab 05/14/14 1746 05/14/14 1912  WBC 27.2*  --   HGB 9.3* 7.5*  HCT 29.6* 22.0*  PLT 159  --    Coag's No results found for this basename: APTT, INR,  in the last 168 hours BMET  Recent Labs Lab 05/14/14 1905 05/14/14 1912 05/15/14 0056  NA 166* 167* 166*  K 4.8 4.5 3.8  CL 128* 128* >130*  CO2 26  --  26  BUN 38* 44* 33*  CREATININE 1.38* 1.40* 1.28*  GLUCOSE 70 71 88   Electrolytes  Recent Labs Lab 05/14/14 1905 05/15/14 0056  CALCIUM 8.1* 7.3*   Sepsis Markers  Recent Labs Lab 05/14/14 0056 05/14/14 1740 05/14/14 2021  LATICACIDVEN  --  4.51* 2.6*  PROCALCITON 2.15  --   --    ABG No results found for this basename: PHART, PCO2ART, PO2ART,  in the last 168 hours Liver Enzymes  Recent Labs Lab 05/14/14 1905  AST 40*  ALT 13  ALKPHOS 108  BILITOT 0.3  ALBUMIN 1.1*   Cardiac Enzymes  Recent Labs Lab 05/15/14 0056  TROPONINI <0.30   Glucose No results found for this basename: GLUCAP,  in the last 168 hours  Imaging Dg Chest Portable 1 View  05/14/2014   CLINICAL DATA:  Decreased responsiveness  EXAM: PORTABLE CHEST - 1 VIEW  COMPARISON:  12/18/2013  FINDINGS: The heart size and mediastinal contours are within normal limits. Both lungs are clear. The visualized skeletal structures are unremarkable.  IMPRESSION: No active disease.   Electronically Signed   By: Inez Catalina M.D.   On: 05/14/2014 18:21     ASSESSMENT / PLAN:  PULMONARY  A:Currently stable on room air P:   H/x of aspiration No thin liquids Swallow eval pending  CARDIOVASCULAR Left EJ A: Hypotension P:  Transiently fluid responsive.  Albumin only 1.1.  Will transfuse 25g 5% albumin (596ml) Will also give 24 hours of hydrocortisone for fluid refractory shock. Would like to avoid central line placement and pressors if possibly but if patient does not respond to the  above measures this will be the next step.   RENAL A:  ARF P:   Likely 2/2 to volume depletion and shock Monitor UOP and Cr  GASTROINTESTINAL A:  Dysphagia P:   Nectar thick liquids Speech to eval swallow   HEMATOLOGIC A:  Anemia P:  Unclear etiology Guaiac stools, follow H&H Transfuse for hemoglobin less than 7.   INFECTIOUS A:  UTI P:  Continue  broad spectrum abx pending cx results BCx2 7/31 UC 7/31  Abx: Vanc/Zosyn, start date7/31, day 1/7  ENDOCRINE A:  Hypothyroidism   P:   TSH to be obtained with next blood draw.      TODAY'S SUMMARY: 68 y/o with CPMR and sepsis from a urinary source.  Treating hypotension with fluids, albumin (given hypoalbuminemia) and hydrocortisone, may need central line and pressors.  Continuing broad spectrum abx for UTI pending cx results.   I have personally obtained a history, examined the patient, evaluated laboratory and imaging results, formulated the assessment and plan and placed orders. CRITICAL CARE: The patient is critically ill with multiple organ systems failure and requires high complexity decision making for assessment and support, frequent evaluation and titration of therapies, application of advanced monitoring technologies and extensive interpretation of multiple databases. Critical Care Time devoted to patient care services described in this note is 45 minutes.    Pulmonary and [place] Pager: [phone]  05/15/2014, 3:48 AM

## 2014-05-16 DIAGNOSIS — E870 Hyperosmolality and hypernatremia: Secondary | ICD-10-CM

## 2014-05-16 DIAGNOSIS — I509 Heart failure, unspecified: Secondary | ICD-10-CM

## 2014-05-16 LAB — BASIC METABOLIC PANEL
ANION GAP: 12 (ref 5–15)
ANION GAP: 14 (ref 5–15)
Anion gap: 12 (ref 5–15)
BUN: 25 mg/dL — ABNORMAL HIGH (ref 6–23)
BUN: 22 mg/dL (ref 6–23)
BUN: 22 mg/dL (ref 6–23)
CALCIUM: 8 mg/dL — AB (ref 8.4–10.5)
CHLORIDE: 116 meq/L — AB (ref 96–112)
CO2: 27 mEq/L (ref 19–32)
CO2: 28 mEq/L (ref 19–32)
CO2: 29 mEq/L (ref 19–32)
Calcium: 7.8 mg/dL — ABNORMAL LOW (ref 8.4–10.5)
Calcium: 7.7 mg/dL — ABNORMAL LOW (ref 8.4–10.5)
Chloride: 126 mEq/L — ABNORMAL HIGH (ref 96–112)
Chloride: 116 mEq/L — ABNORMAL HIGH (ref 96–112)
Creatinine, Ser: 1.33 mg/dL — ABNORMAL HIGH (ref 0.50–1.10)
Creatinine, Ser: 1.24 mg/dL — ABNORMAL HIGH (ref 0.50–1.10)
Creatinine, Ser: 1.19 mg/dL — ABNORMAL HIGH (ref 0.50–1.10)
GFR calc Af Amer: 47 mL/min — ABNORMAL LOW (ref >90)
GFR calc Af Amer: 51 mL/min — ABNORMAL LOW (ref >90)
GFR calc non Af Amer: 44 mL/min — ABNORMAL LOW (ref >90)
GFR calc non Af Amer: 46 mL/min — ABNORMAL LOW (ref >90)
GFR, EST AFRICAN AMERICAN: 54 mL/min — AB (ref >90)
GFR, EST NON AFRICAN AMERICAN: 40 mL/min — AB (ref >90)
Glucose, Bld: 162 mg/dL — ABNORMAL HIGH (ref 70–99)
Glucose, Bld: 204 mg/dL — ABNORMAL HIGH (ref 70–99)
Glucose, Bld: 153 mg/dL — ABNORMAL HIGH (ref 70–99)
POTASSIUM: 2.6 meq/L — AB (ref 3.7–5.3)
Potassium: 3.4 mEq/L — ABNORMAL LOW (ref 3.7–5.3)
Potassium: 3.3 mEq/L — ABNORMAL LOW (ref 3.7–5.3)
SODIUM: 165 meq/L — AB (ref 137–147)
Sodium: 158 mEq/L — ABNORMAL HIGH (ref 137–147)
Sodium: 157 mEq/L — ABNORMAL HIGH (ref 137–147)

## 2014-05-16 LAB — TYPE AND SCREEN
ABO/RH(D): B POS
Antibody Screen: NEGATIVE
UNIT DIVISION: 0
Unit division: 0
Unit division: 0

## 2014-05-16 LAB — POTASSIUM: POTASSIUM: 2.5 meq/L — AB (ref 3.7–5.3)

## 2014-05-16 LAB — URINE CULTURE: Colony Count: >=100000

## 2014-05-16 LAB — LACTIC ACID, PLASMA
LACTIC ACID, VENOUS: 2.8 mmol/L — AB (ref 0.5–2.2)
Lactic Acid, Venous: 1.6 mmol/L (ref 0.5–2.2)

## 2014-05-16 LAB — PROCALCITONIN: PROCALCITONIN: 1.98 ng/mL

## 2014-05-16 LAB — MAGNESIUM: Magnesium: 2 mg/dL (ref 1.5–2.5)

## 2014-05-16 MED ORDER — POTASSIUM CHLORIDE 10 MEQ/100ML IV SOLN
10.0000 meq | INTRAVENOUS | Status: AC
  Administered 2014-05-16 (×5): 10 meq via INTRAVENOUS
  Filled 2014-05-16 (×4): qty 100

## 2014-05-16 MED ORDER — DEXTROSE 5 % IV SOLN
INTRAVENOUS | Status: DC
  Administered 2014-05-16 (×3): via INTRAVENOUS

## 2014-05-16 MED ORDER — DEXTROSE 5 % IV BOLUS
2000.0000 mL | Freq: Once | INTRAVENOUS | Status: AC
  Administered 2014-05-16: 2000 mL via INTRAVENOUS

## 2014-05-16 MED ORDER — POTASSIUM CL IN DEXTROSE 5% 20 MEQ/L IV SOLN
20.0000 meq | INTRAVENOUS | Status: DC
  Administered 2014-05-16 – 2014-05-17 (×2): 20 meq via INTRAVENOUS
  Filled 2014-05-16 (×4): qty 1000

## 2014-05-16 NOTE — Progress Notes (Signed)
CRITICAL VALUE ALERT  Critical value received:  K 2.6  Date of notification:  05/16/14   Time of notification:  5726  Critical value read back:Yes.    Nurse who received alert:  Curt Bears RN  MD notified (1st page):  Dr. Sherral Hammers  Time of first page:  1455  MD notified (2nd page):  Time of second page:  Responding MD:     Time MD responded:   [phone]

## 2014-05-16 NOTE — Evaluation (Signed)
Clinical/Bedside Swallow Evaluation Patient Details  Name: Dawn [name] MRN: [id number] Date of Birth: Mar 26, 1946  Today's Date: 05/16/2014 Time: 9562-1308 SLP Time Calculation (min): 15 min  Past Medical History:  Past Medical History  Diagnosis Date  . Seizures   . Osteoporosis   . Thyroid disease     hypothyroid  . High cholesterol   . Cataracts, bilateral   . Optic atrophy   . Paraparesis     mild  . Hypothyroidism   . Anginal pain   . Coronary artery disease    Past Surgical History:  Past Surgical History  Procedure Laterality Date  . Midline incision    . Central venous catheter insertion  05/15/2014        HPI:  Pt is a 68 y.o. female w/ PMH of Seizures; Osteoporosis; Thyroid disease; High cholesterol; Cataracts, bilateral; Optic atrophy; Paraparesis; Hypothyroidism; Anginal pain; and Coronary artery disease. Baseline intellectual disability, wheelchair bound, nonverbal. stopped eating past few days and seemed unable to swallow and has been pocketing food. Has had similar episodes in the past and has recovered. Likely UTI. Suspected aspiration but CXR clear. Normally on mech soft diet/ thin but downgraded to NTL at facility. Treated for PNA 2 weeks ago. Bedside swallow eval ordered to assess swallow function given hx of possible dysphagia.    Assessment / Plan / Recommendation Clinical Impression  Pt alert today and tolerated oral care with suction; however was unable to follow directions for oral motor exam. Provided ice chips- pt did not demonstrate labial closure during trials, so most of bolus lost anteriorly. Pt did trigger a swallow with 3/3 ice chip trials without overt s/s of aspiration but demonstrated decreased sustained attention to bolus as evidenced by anterior loss and impulsivity/ figdety throughout evaluation, frequently pulling at cords- informed RN. Given these findings, pt continues to be at high risk of aspiration. Pt has hx of intellectual  disability/ nonverbal/ impulsive and it is not known how far from baseline the pt currently is. Recent hx of PNA per MD report also risk factor. Recommend that pt remain NPO at this time, meds alt means. ST will continue to follow for PO readiness.    Aspiration Risk  Severe    Diet Recommendation NPO   Medication Administration: Via alternative means    Other  Recommendations     Follow Up Recommendations       Frequency and Duration min 2x/week  2 weeks   Pertinent Vitals/Pain n/a    SLP Swallow Goals     Swallow Study Prior Functional Status       General HPI: Pt is a 68 y.o. female w/ PMH of Seizures; Osteoporosis; Thyroid disease; High cholesterol; Cataracts, bilateral; Optic atrophy; Paraparesis; Hypothyroidism; Anginal pain; and Coronary artery disease. Baseline intellectual disability, wheelchair bound, nonverbal. stopped eating past few days and seemed unable to swallow and has been pocketing food. Has had similar episodes in the past and has recovered. Likely UTI. Suspected aspiration but CXR clear. Normally on mech soft diet/ thin but downgraded to NTL at facility. Treated for PNA 2 weeks ago. Bedside swallow eval ordered to assess swallow function given hx of possible dysphagia.  Type of Study: Bedside swallow evaluation Diet Prior to this Study: NPO Temperature Spikes Noted: No Respiratory Status: Room air History of Recent Intubation: No Behavior/Cognition: Alert;Requires cueing;Doesn't follow directions;Impulsive Patient Positioning: Upright in bed Baseline Vocal Quality: Other (comment) (did not vocalize) Volitional Cough: Cognitively unable to elicit Volitional Swallow: Unable to elicit  Oral/Motor/Sensory Function Overall Oral Motor/Sensory Function: Other (comment) (unable to fully assess)   Ice Chips Ice chips: Impaired Presentation: Spoon Oral Phase Impairments: Reduced labial seal;Poor awareness of bolus Oral Phase Functional Implications: Left  anterior spillage   Thin Liquid Thin Liquid: Not tested    Nectar Thick Nectar Thick Liquid: Not tested   Honey Thick Honey Thick Liquid: Not tested   Puree Puree: Not tested   Solid   GO    Solid: Not tested       Gilmore Laroche, Alister Staver K, MA, CCC-SLP 05/16/2014,9:53 AM

## 2014-05-16 NOTE — Progress Notes (Addendum)
[facility] TEAM 1 - Stepdown/ICU TEAM Progress Note  [name] WER:[id number] DOB: Mar 17, 1946 DOA: 05/14/2014 PCP: [name]  Admit HPI / Brief Narrative: Dawn [name] is a 68 y.o. BF PMHx Seizures; diastolic CHF, atrial fibrillation, pituitary microadenoma (diagnosed 2009) hypothyroidism, Osteoporosis; Thyroid disease; High cholesterol; Cataracts, bilateral; Optic atrophy; Paraparesis; Hypothyroidism; Anginal pain; and Coronary artery disease.  Presented with  Baseline mental retardation wheelchair bound and non-verbal. Patient have stopped eating for the past few days seems to be unable to swallow. Nursing stuff denies any fever or chills. She has been pocketing food in her mouth. Reportedly had dental visit last week. Reportedly she had similar episodes in the past and recovered.  Upon arrival to emerge department patient was noted to be hypotensive down to 70s. She was given IV fluids and after 2 L blood pressure improved to her baseline of 110s. Sister at bedside discussed goals of care. Family wishes for her to be limited code. Patient appears to have urinary tract infection leading to sepsis. In emergency department Zosyn and vancomycin have been started. She is suspected to have aspiration although chest x-ray at this point is clear.  Patient has been on mechanical soft diet for years with thin fluids for the past few days she was switched to nectar thick due to questions if she was aspirating. The past 24 hours she have stopped eating and drinking altogether.  Per group home 2 weeks ago patient was treated for mild pneumonia.  Hospitalist was called for admission for sepsis likely secondary to UTI with possible severe dysphasia.   HPI/Subjective: 8/2 actively moving around, spontaneously opens eyes, does not follow commands, pulling at minute mittens   Assessment/Plan: Goals of care; -Spoke with [name] [phone] via phone who is patient's guardian has  okayed all treatment modalities required to include central venous line placement, blooSpoke with [name] [phone] d products. Has also stated that she would like patient code to be changed to DO NOT RESUSCITATE  Sepsis/septic shock -Per intake/output intake patient is -1.7 liters (unsure this is accurate given him out all fluids patient has received) -Bolus of D5W 2 L, at completion continue D5W + K 27meq/ L at 168ml/hr  -Lactic Acid= 2.8  Hypotension -Patient continues to be borderline hypotensive secondary to sepsis. -continue Dopamine drip at 5 mcg/ kilogram/min, for goal MAP > 65, until patient is a volume resuscitated. -CVP =6  Hypernatremia -Bolus 2 L D5W, - repeat BMP q 6 hr and Lactric acid at1600  Hypokalemia -DC D5W, start D5W+ potassium 20 mEq/L @100ml /hr -Potassium 10 mEq x 5 runs -Obtain stat repeat potassium, and magnesium  Symptomatic anemia -8/1 Transfused 3 units PRBC; anemia resolved  -Obtain CBC at 8250  Chronic Diastolic heart failure  -Patient will need to be monitored closely for Fluid overload -CVP=6   Paroxysmal atrial fibrillation  - Currrently NSR   Dehydration  -Continue aggressive hydration, will have to monitor closely for fluid overload  Hypocalcemia -Corrected calcium = 9.6  Seizure disorder  -Continue Keppra 500 mg qAm and 750 mg  @ 1400 and 2000 -Keppra level pending -Continue Depacon 750 mg TID -Depacon level = 62 point    Code Status: DNR Family Communication: Spoke with [name] [phone] via phone who is patient's guardian on home. Disposition Plan: Resolution septic shock   Consultants: NA   Procedure/Significant Events: 1/15 echocardiogram - Left ventricle: mild focal basal hypertrophy of the septum. -LVEF= 60% to 65%.  -(grade  2 diastolic dysfunction).     Culture 7/31 urine positive gram-negative rods; awaiting speciation/susceptibility 7/31 blood right antecubital x2 NGTD 7/31 MRSA by  PCR negative   Antibiotics: Zosyn 7/31>> Vancomycin 7/31>>   DVT prophylaxis: SCD   Devices NA   LINES / TUBES:  7/31 ga left antecubital>> DC 8/1 8./1 22ga right forearm>> DC 8/1 8/1 20ga left EJ>> DC 8/1 8/1 triple-lumen CVC left subclavian       Continuous Infusions: . dextrose 150 mL/hr at 05/16/14 0853  . DOPamine 5 mcg/kg/min (05/15/14 1854)    Objective: VITAL SIGNS: Temp: 98.9 F (37.2 C) (08/02 0755) Temp src: Core (Comment) (08/02 0755) BP: 103/78 mmHg (08/02 1000) Pulse Rate: 66 (08/02 1000) SPO2; 100% on 2 L via [place] FIO2:   Intake/Output Summary (Last 24 hours) at 05/16/14 1049 Last data filed at 05/16/14 0836  Gross per 24 hour  Intake 3936.63 ml  Output   6850 ml  Net -2913.37 ml     Exam: General: Awake, does not follow commands, veins in fetal position (per Ms. Sellers this is her baseline physician), No acute respiratory distress Lungs: Clear to auscultation bilaterally without wheezes or crackles Cardiovascular:  Regular rhythm and rate without murmur gallop or rub normal S1 and S2 Abdomen: Nontender, nondistended, soft, bowel sounds positive, no rebound, no ascites, no appreciable mass Extremities: No significant cyanosis, clubbing. +2+ pitting edema bilateral lower extremities  Data Reviewed: Basic Metabolic Panel:  Recent Labs Lab 05/14/14 1905 05/14/14 1912 05/15/14 0056 05/15/14 0600 05/15/14 2119 05/16/14 0555  NA 166* 167* 166* 162* 164* 165*  K 4.8 4.5 3.8 3.6* 3.7 3.4*  CL 128* 128* >130* 130* 123* 126*  CO2 26  --  26 24 25 27   GLUCOSE 70 71 88 148* 195* 162*  BUN 38* 44* 33* 30* 28* 25*  CREATININE 1.38* 1.40* 1.28* 1.24* 1.25* 1.33*  CALCIUM 8.1*  --  7.3* 7.3* 7.9* 8.0*   Liver Function Tests:  Recent Labs Lab 05/14/14 1905 05/15/14 0600 05/15/14 0740 05/15/14 2119  AST 40* 32  --  36  ALT 13 12  --  18  ALKPHOS 108 70  --  92  BILITOT 0.3 0.3  --  1.2  PROT 6.9 5.6*  --  7.0  ALBUMIN 1.1* 1.8*  2.0* 2.2*   No results found for this basename: LIPASE, AMYLASE,  in the last 168 hours No results found for this basename: AMMONIA,  in the last 168 hours CBC:  Recent Labs Lab 05/14/14 1746 05/14/14 1912 05/15/14 0740 05/15/14 2119  WBC 27.2*  --  15.4* 19.3*  NEUTROABS 20.9*  --  12.7* 17.3*  HGB 9.3* 7.5* 5.2* 13.5  HCT 29.6* 22.0* 17.3* 39.8  MCV 97.7  --  99.4 90.0  PLT 159  --  70* 80*   Cardiac Enzymes:  Recent Labs Lab 05/15/14 0056 05/15/14 0600 05/15/14 2119  TROPONINI <0.30 <0.30 <0.30   BNP (last 3 results)  Recent Labs  10/28/13 1547  PROBNP 488.2*   CBG: No results found for this basename: GLUCAP,  in the last 168 hours  Recent Results (from the past 240 hour(s))  URINE CULTURE     Status: None   Collection Time    05/14/14  5:19 PM      Result Value Ref Range Status   Specimen Description URINE, CATHETERIZED   Final   Special Requests NONE   Final   Culture  Setup Time     Final  Value: 05/14/2014 21:39     Performed at [facility]     Final   Value: >=100,000 COLONIES/ML     Performed at [facility]   Culture     Final   Value: [place]     Performed at [facility]   Report Status PENDING   Incomplete  CULTURE, BLOOD (ROUTINE X 2)     Status: None   Collection Time    05/14/14  5:47 PM      Result Value Ref Range Status   Specimen Description BLOOD RIGHT ANTECUBITAL   Final   Special Requests BOTTLES DRAWN AEROBIC AND ANAEROBIC 2CC   Final   Culture  Setup Time     Final   Value: 05/14/2014 21:13     Performed at [facility]   Culture     Final   Value:        BLOOD CULTURE RECEIVED NO GROWTH TO DATE CULTURE WILL BE HELD FOR 5 DAYS BEFORE ISSUING A FINAL NEGATIVE REPORT     Performed at [facility]   Report Status PENDING   Incomplete  CULTURE, BLOOD (ROUTINE X 2)     Status: None   Collection Time    05/14/14  5:47 PM      Result Value Ref Range Status    Specimen Description BLOOD RIGHT ANTECUBITAL   Final   Special Requests BOTTLES DRAWN AEROBIC AND ANAEROBIC 2CC   Final   Culture  Setup Time     Final   Value: 05/14/2014 21:13     Performed at [facility]   Culture     Final   Value:        BLOOD CULTURE RECEIVED NO GROWTH TO DATE CULTURE WILL BE HELD FOR 5 DAYS BEFORE ISSUING A FINAL NEGATIVE REPORT     Performed at [facility]   Report Status PENDING   Incomplete  MRSA PCR SCREENING     Status: None   Collection Time    05/14/14 10:29 PM      Result Value Ref Range Status   MRSA by PCR NEGATIVE  NEGATIVE Final   Comment:            The GeneXpert MRSA Assay (FDA     approved for NASAL specimens     only), is one component of a     comprehensive MRSA colonization     surveillance program. It is not     intended to diagnose MRSA     infection nor to guide or     monitor treatment for     MRSA infections.     Studies:  Recent x-ray studies have been reviewed in detail by the Attending Physician  Scheduled Meds:  Scheduled Meds: . antiseptic oral rinse  7 mL Mouth Rinse BID  . chlorhexidine  15 mL Mouth Rinse BID  . furosemide  20 mg Intravenous BID  . levETIRAcetam  750 mg Intravenous 2 times per day  . levETIRAcetam  500 mg Intravenous Q24H  . levothyroxine  37.5 mcg Intravenous Daily  . pantoprazole (PROTONIX) IV  40 mg Intravenous QHS  . piperacillin-tazobactam (ZOSYN)  IV  3.375 g Intravenous 3 times per day  . valproate sodium  750 mg Intravenous 3 times per day  . vancomycin  750 mg Intravenous Q24H    Time spent on care of this patient: 40 mins   Timmey Lamba, J ,  MD   [facility] Office  [phone] Pager - [phone]  On-Call/Text Page:      [email or web address]      password [email or web address]  If 7PM-7AM, please contact night-coverage [email or web address] Password [email or web address] 05/16/2014, 10:49 AM   LOS: 2 days

## 2014-05-17 LAB — BASIC METABOLIC PANEL
ANION GAP: 10 (ref 5–15)
Anion gap: 9 (ref 5–15)
Anion gap: 8 (ref 5–15)
BUN: 20 mg/dL (ref 6–23)
BUN: 20 mg/dL (ref 6–23)
BUN: 20 mg/dL (ref 6–23)
CALCIUM: 7.4 mg/dL — AB (ref 8.4–10.5)
CALCIUM: 7.7 mg/dL — AB (ref 8.4–10.5)
CHLORIDE: 120 meq/L — AB (ref 96–112)
CO2: 28 mEq/L (ref 19–32)
CO2: 30 mEq/L (ref 19–32)
CO2: 27 meq/L (ref 19–32)
CREATININE: 1.08 mg/dL (ref 0.50–1.10)
CREATININE: 1.13 mg/dL — AB (ref 0.50–1.10)
Calcium: 8 mg/dL — ABNORMAL LOW (ref 8.4–10.5)
Chloride: 117 mEq/L — ABNORMAL HIGH (ref 96–112)
Chloride: 116 mEq/L — ABNORMAL HIGH (ref 96–112)
Creatinine, Ser: 1.05 mg/dL (ref 0.50–1.10)
GFR calc Af Amer: 57 mL/min — ABNORMAL LOW (ref >90)
GFR calc non Af Amer: 54 mL/min — ABNORMAL LOW (ref >90)
GFR calc non Af Amer: 49 mL/min — ABNORMAL LOW (ref >90)
GFR, EST AFRICAN AMERICAN: 62 mL/min — AB (ref >90)
GFR, EST AFRICAN AMERICAN: 60 mL/min — AB (ref >90)
GFR, EST NON AFRICAN AMERICAN: 52 mL/min — AB (ref >90)
GLUCOSE: 109 mg/dL — AB (ref 70–99)
GLUCOSE: 109 mg/dL — AB (ref 70–99)
Glucose, Bld: 110 mg/dL — ABNORMAL HIGH (ref 70–99)
POTASSIUM: 3.9 meq/L (ref 3.7–5.3)
Potassium: 3.8 mEq/L (ref 3.7–5.3)
Potassium: 3.6 mEq/L — ABNORMAL LOW (ref 3.7–5.3)
SODIUM: 155 meq/L — AB (ref 137–147)
Sodium: 155 mEq/L — ABNORMAL HIGH (ref 137–147)
Sodium: 155 mEq/L — ABNORMAL HIGH (ref 137–147)

## 2014-05-17 LAB — LEGIONELLA ANTIGEN, URINE: Legionella Antigen, Urine: NEGATIVE

## 2014-05-17 LAB — MAGNESIUM: Magnesium: 1.8 mg/dL (ref 1.5–2.5)

## 2014-05-17 MED ORDER — DEXTROSE 5 % IV SOLN
1.0000 g | INTRAVENOUS | Status: DC
  Administered 2014-05-17 – 2014-05-21 (×5): 1 g via INTRAVENOUS
  Filled 2014-05-17 (×6): qty 10

## 2014-05-17 MED ORDER — SODIUM CHLORIDE 0.9 % IV BOLUS (SEPSIS)
500.0000 mL | Freq: Once | INTRAVENOUS | Status: AC
  Administered 2014-05-17: 500 mL via INTRAVENOUS

## 2014-05-17 MED ORDER — DEXTROSE-NACL 5-0.45 % IV SOLN
INTRAVENOUS | Status: DC
  Administered 2014-05-17: 500 mL via INTRAVENOUS
  Administered 2014-05-17: 800 mL via INTRAVENOUS
  Administered 2014-05-18: 1000 mL via INTRAVENOUS
  Administered 2014-05-18: via INTRAVENOUS
  Administered 2014-05-18: 1000 mL via INTRAVENOUS
  Administered 2014-05-19 (×2): via INTRAVENOUS

## 2014-05-17 NOTE — Progress Notes (Signed)
ANTIBIOTIC CONSULT NOTE - FOLLOW UP  Pharmacy Consult for vancomycin and Zosyn Indication: sepsis, UTI  Allergies  Allergen Reactions  . Carvedilol   . Ppd [Tuberculin Purified Protein Derivative]     Per [facility]    Patient Measurements: Height: 5' 1.81" (157 cm) Weight: 131 lb 13.4 oz (59.8 kg) IBW/kg (Calculated) : 49.67  Vital Signs: Temp: 98.1 F (36.7 C) (08/03 0749) Temp src: Axillary (08/03 0749) BP: 88/51 mmHg (08/03 0749) Pulse Rate: 101 (08/03 0749) Intake/Output from previous day: 08/02 0701 - 08/03 0700 In: 2433.6 [I.V.:1933.6; IV Piggyback:500] Out: 4600 [Urine:4600] Intake/Output from this shift: Total I/O In: 250 [I.V.:200; IV Piggyback:50] Out: -   Labs:  Recent Labs  05/14/14 1746  05/14/14 1912  05/15/14 0740 05/15/14 2119  05/16/14 1745 05/17/14 0026 05/17/14 0611  WBC 27.2*  --   --   --  15.4* 19.3*  --   --   --   --   HGB 9.3*  --  7.5*  --  5.2* 13.5  --   --   --   --   PLT 159  --   --   --  70* 80*  --   --   --   --   CREATININE  --   < > 1.40*  < >  --  1.25*  < > 1.19* 1.05 1.08  < > = values in this interval not displayed. Estimated Creatinine Clearance: 42.9 ml/min (by C-G formula based on Cr of 1.08). No results found for this basename: VANCOTROUGH, Corlis Leak, VANCORANDOM, [place], [place], GENTRANDOM, [place], TOBRAPEAK, TOBRARND, AMIKACINPEAK, AMIKACINTROU, AMIKACIN,  in the last 72 hours   Microbiology: Recent Results (from the past 720 hour(s))  URINE CULTURE     Status: None   Collection Time    05/14/14  5:19 PM      Result Value Ref Range Status   Specimen Description URINE, CATHETERIZED   Final   Special Requests NONE   Final   Culture  Setup Time     Final   Value: 05/14/2014 21:39     Performed at [facility]     Final   Value: >=100,000 COLONIES/ML     Performed at [facility]   Culture     Final   Value: ESCHERICHIA COLI     Performed at [facility]   Report  Status 05/16/2014 FINAL   Final   Organism ID, Bacteria ESCHERICHIA COLI   Final  CULTURE, BLOOD (ROUTINE X 2)     Status: None   Collection Time    05/14/14  5:47 PM      Result Value Ref Range Status   Specimen Description BLOOD RIGHT ANTECUBITAL   Final   Special Requests BOTTLES DRAWN AEROBIC AND ANAEROBIC 2CC   Final   Culture  Setup Time     Final   Value: 05/14/2014 21:13     Performed at [facility]   Culture     Final   Value:        BLOOD CULTURE RECEIVED NO GROWTH TO DATE CULTURE WILL BE HELD FOR 5 DAYS BEFORE ISSUING A FINAL NEGATIVE REPORT     Performed at [facility]   Report Status PENDING   Incomplete  CULTURE, BLOOD (ROUTINE X 2)     Status: None   Collection Time    05/14/14  5:47 PM      Result Value Ref Range Status  Specimen Description BLOOD RIGHT ANTECUBITAL   Final   Special Requests BOTTLES DRAWN AEROBIC AND ANAEROBIC 2CC   Final   Culture  Setup Time     Final   Value: 05/14/2014 21:13     Performed at [facility]   Culture     Final   Value:        BLOOD CULTURE RECEIVED NO GROWTH TO DATE CULTURE WILL BE HELD FOR 5 DAYS BEFORE ISSUING A FINAL NEGATIVE REPORT     Performed at [facility]   Report Status PENDING   Incomplete  MRSA PCR SCREENING     Status: None   Collection Time    05/14/14 10:29 PM      Result Value Ref Range Status   MRSA by PCR NEGATIVE  NEGATIVE Final   Comment:            The GeneXpert MRSA Assay (FDA     approved for NASAL specimens     only), is one component of a     comprehensive MRSA colonization     surveillance program. It is not     intended to diagnose MRSA     infection nor to guide or     monitor treatment for     MRSA infections.    Anti-infectives   Start     Dose/Rate Route Frequency Ordered Stop   05/15/14 1800  vancomycin (VANCOCIN) IVPB 750 mg/150 ml premix     750 mg 150 mL/hr over 60 Minutes Intravenous Every 24 hours 05/14/14 2331     05/15/14 0000   piperacillin-tazobactam (ZOSYN) IVPB 3.375 g     3.375 g 12.5 mL/hr over 240 Minutes Intravenous 3 times per day 05/14/14 2316     05/14/14 2315  piperacillin-tazobactam (ZOSYN) IVPB 3.375 g  Status:  Discontinued     3.375 g 100 mL/hr over 30 Minutes Intravenous 3 times per day 05/14/14 2309 05/14/14 2316   05/14/14 1830  vancomycin (VANCOCIN) IVPB 1000 mg/200 mL premix     1,000 mg 200 mL/hr over 60 Minutes Intravenous  Once 05/14/14 1822 05/14/14 1958   05/14/14 1830  piperacillin-tazobactam (ZOSYN) IVPB 3.375 g     3.375 g 100 mL/hr over 30 Minutes Intravenous  Once 05/14/14 1822 05/14/14 1857      Assessment: 68 y/o female admitted with sepsis from a urinary source. She is on day 3 vancomycin and Zosyn for sepsis. Urine culture is growing E coli which is pan sensitive. ARF is improving. She is afebrile, WBC are trending down.  Goal of Therapy:  Vancomycin trough level 15-20 mcg/ml Eradication of infection  Plan:  - Continue vancomycin 750 mg IV q24h - Continue Zosyn 3.375 g IV q8h to be infused over 4 hours - Consider discontinue vancomycin since E coli growing and narrow Zosyn to ceftriaxone - Monitor renal function, clinical progress, and culture data   [facility], [place].D., BCPS Clinical Pharmacist Pager: [phone] 05/17/2014 9:10 AM

## 2014-05-17 NOTE — Progress Notes (Signed)
[facility] TEAM 1 - Stepdown/ICU TEAM Progress Note  Dawn [name] PXT:[id number] DOB: 12/11/1945 DOA: 05/14/2014 PCP: [name]  Admit HPI / Brief Narrative: Dawn [name] is a 68 y.o. PMH of Seizures; diastolic CHF, atrial fibrillation, pituitary microadenoma (diagnosed 2009), hypothyroidism; High cholesterol and CAD. She also has developmental disability and at baseline non verbal and wheelchair bound status  She presented after staff at the group home noticed she had stopped eating for the past few days and seemed to be unable to swallow. No apparent fever or chills. She had been pocketing food in her mouth.  Had dental visit the previous week. Reportedly she had similar episodes of these symptoms in the past and recovered.   Upon arrival to emerge department patient was noted to be hypotensive (SBP down to 70s). She was given IV fluids and after 2 L blood pressure improved to her baseline of 110s. Sister was at the bedside so the admitting MD discussed goals of care. Family wished for her to be limited code. Patient appeared to have urinary tract infection leading to sepsis. Zosyn and vancomycin had been started by the EDP. There was also concern over possible aspiration although chest x-ray was unremarkable. She had been on mechanical soft diet for years with thin fluids but for several days prior to presentation she was switched to nectar thick due to concerns of active aspiration.dysphagia progression. For the 24 hours prior to presenatation she had stopped eating and drinking altogether. Per group home 2 weeks ago patient was treated for mild pneumonia.   HPI/Subjective: Awakens but seems very sleepy  Assessment/Plan:  Sepsis/septic shock due to E.Coli UTI -Felt to be volume depleted based on negative balance on I/O -Source appears to be E. Coli urinary tract infection continue empiric anbx's-consider narrow and dc Vancomycin -Lactic Acid has decreased from 2.8 to  1.6 -BP remains soft but suspect remains volume depleted as evidenced by ongonig hypernatremia and low CVP (4-6) so will continue IVFs but increase rate and wean/dc Dopamine -Blood cx NGTD  Hypernatremia with dehydration -Was bolus with 2 L D5W on 8/2 but hypernatremia persists -change to D51/2 NS and increase rate to 150 cc/hr -follow lytes q 12 hours  Hypokalemia -follow lytes and replete prn -Mg stable  Symptomatic anemia -8/1 Transfused 3 units PRBC; anemia resolved and now hgb 62.7  Chronic Diastolic heart failure  -Patient will need to be monitored closely for Fluid overload with rehydration  Paroxysmal atrial fibrillation  - Currrently in NSR   Dehydration  -Continue aggressive hydration, will have to monitor closely for fluid overload  Hypocalcemia -Corrected calcium = 9.6  Seizure disorder  -Continue Keppra and Depacon -Valproic acid level 63.8 -Depacon level = 62 point    Code Status: DNR Family Communication: Spoke with Dawn [name] [phone] via phone who is patient's guardian on home. Goals of care reviewed by MD on 8/2 at which time code status was changed to DNR Disposition Plan: Resolution septic shock  Consultants: NA  Procedure/Significant Events: 1/15 echocardiogram - Left ventricle: mild focal basal hypertrophy of the septum. -LVEF= 60% to 65%.  -(grade 2 diastolic dysfunction).  Culture 7/31 urine cx: E Coli resistant to Ampicillin 7/31 blood cx x2 NGTD 7/31 MRSA by PCR negative  Antibiotics: Zosyn 7/31>> Vancomycin 7/31>>  DVT prophylaxis: SCD  Devices NA   LINES / TUBES:  7/31 ga left antecubital>> DC 8/1 8./1 22ga right forearm>> DC 8/1 8/1 20ga left EJ>> DC 8/1 8/1 triple-lumen CVC  left subclavian  Objective: VITAL SIGNS: Temp: 97.6 F (36.4 C) (08/03 1220) Temp src: Axillary (08/03 1220) BP: 90/58 mmHg (08/03 1220) Pulse Rate: 80 (08/03 1220) SPO2; 100% on 2 L via [place] FIO2:   Intake/Output Summary (Last 24  hours) at 05/17/14 1310 Last data filed at 05/17/14 1222  Gross per 24 hour  Intake 2063.6 ml  Output   2800 ml  Net -736.4 ml   Exam: General: Awake, does not follow commands, remains in fetal position (per Dawn [name] this is her baseline), No acute respiratory distress ENT: No jaw or facial swelling or cervical adenopathy, no obvious erythema at gumline Lungs: Clear to auscultation bilaterally without wheezes or crackles, RA Cardiovascular:  Regular rhythm and rate without murmur gallop or rub normal S1 and S2-SBP soft on Dopamine gtt Abdomen: Nontender, nondistended, soft, bowel sounds positive, no rebound, no ascites, no appreciable mass Extremities: No significant cyanosis, clubbing. 2+ pitting edema bilateral lower extremities  Data Reviewed: Basic Metabolic Panel:  Recent Labs Lab 05/16/14 0555 05/16/14 1200 05/16/14 1503 05/16/14 1745 05/17/14 0026 05/17/14 0611  NA 165* 158*  --  157* 155* 155*  K 3.4* 2.6* 2.5* 3.3* 3.9 3.8  CL 126* 116*  --  116* 117* 116*  CO2 27 28  --  29 28 30   GLUCOSE 162* 204*  --  153* 110* 109*  BUN 25* 22  --  22 20 20   CREATININE 1.33* 1.24*  --  1.19* 1.05 1.08  CALCIUM 8.0* 7.8*  --  7.7* 7.7* 8.0*  MG  --   --  2.0  --   --  1.8   Liver Function Tests:  Recent Labs Lab 05/14/14 1905 05/15/14 0600 05/15/14 0740 05/15/14 2119  AST 40* 32  --  36  ALT 13 12  --  18  ALKPHOS 108 70  --  92  BILITOT 0.3 0.3  --  1.2  PROT 6.9 5.6*  --  7.0  ALBUMIN 1.1* 1.8* 2.0* 2.2*   No results found for this basename: LIPASE, AMYLASE,  in the last 168 hours No results found for this basename: AMMONIA,  in the last 168 hours CBC:  Recent Labs Lab 05/14/14 1746 05/14/14 1912 05/15/14 0740 05/15/14 2119  WBC 27.2*  --  15.4* 19.3*  NEUTROABS 20.9*  --  12.7* 17.3*  HGB 9.3* 7.5* 5.2* 13.5  HCT 29.6* 22.0* 17.3* 39.8  MCV 97.7  --  99.4 90.0  PLT 159  --  70* 80*   Cardiac Enzymes:  Recent Labs Lab 05/15/14 0056  05/15/14 0600 05/15/14 2119  TROPONINI <0.30 <0.30 <0.30   BNP (last 3 results)  Recent Labs  10/28/13 1547  PROBNP 488.2*   CBG: No results found for this basename: GLUCAP,  in the last 168 hours  Recent Results (from the past 240 hour(s))  URINE CULTURE     Status: None   Collection Time    05/14/14  5:19 PM      Result Value Ref Range Status   Specimen Description URINE, CATHETERIZED   Final   Special Requests NONE   Final   Culture  Setup Time     Final   Value: 05/14/2014 21:39     Performed at [facility]     Final   Value: >=100,000 COLONIES/ML     Performed at [facility]   Culture     Final   Value: ESCHERICHIA COLI  Performed at [facility]   Report Status 05/16/2014 FINAL   Final   Organism ID, Bacteria ESCHERICHIA COLI   Final  CULTURE, BLOOD (ROUTINE X 2)     Status: None   Collection Time    05/14/14  5:47 PM      Result Value Ref Range Status   Specimen Description BLOOD RIGHT ANTECUBITAL   Final   Special Requests BOTTLES DRAWN AEROBIC AND ANAEROBIC 2CC   Final   Culture  Setup Time     Final   Value: 05/14/2014 21:13     Performed at [facility]   Culture     Final   Value:        BLOOD CULTURE RECEIVED NO GROWTH TO DATE CULTURE WILL BE HELD FOR 5 DAYS BEFORE ISSUING A FINAL NEGATIVE REPORT     Performed at [facility]   Report Status PENDING   Incomplete  CULTURE, BLOOD (ROUTINE X 2)     Status: None   Collection Time    05/14/14  5:47 PM      Result Value Ref Range Status   Specimen Description BLOOD RIGHT ANTECUBITAL   Final   Special Requests BOTTLES DRAWN AEROBIC AND ANAEROBIC 2CC   Final   Culture  Setup Time     Final   Value: 05/14/2014 21:13     Performed at [facility]   Culture     Final   Value:        BLOOD CULTURE RECEIVED NO GROWTH TO DATE CULTURE WILL BE HELD FOR 5 DAYS BEFORE ISSUING A FINAL NEGATIVE REPORT     Performed at [facility]   Report  Status PENDING   Incomplete  MRSA PCR SCREENING     Status: None   Collection Time    05/14/14 10:29 PM      Result Value Ref Range Status   MRSA by PCR NEGATIVE  NEGATIVE Final   Comment:            The GeneXpert MRSA Assay (FDA     approved for NASAL specimens     only), is one component of a     comprehensive MRSA colonization     surveillance program. It is not     intended to diagnose MRSA     infection nor to guide or     monitor treatment for     MRSA infections.     Studies:  Recent x-ray studies have been reviewed in detail by the Attending Physician  Scheduled Meds:  Scheduled Meds: . antiseptic oral rinse  7 mL Mouth Rinse BID  . chlorhexidine  15 mL Mouth Rinse BID  . levETIRAcetam  750 mg Intravenous 2 times per day  . levETIRAcetam  500 mg Intravenous Q24H  . levothyroxine  37.5 mcg Intravenous Daily  . pantoprazole (PROTONIX) IV  40 mg Intravenous QHS  . piperacillin-tazobactam (ZOSYN)  IV  3.375 g Intravenous 3 times per day  . valproate sodium  750 mg Intravenous 3 times per day  . vancomycin  750 mg Intravenous Q24H    Time spent on care of this patient: 35 mins   ELLIS,ALLISON L. , ANP  [facility] Office  [phone] Pager - [phone]  On-Call/Text Page:      [email or web address]      password [email or web address]  If 7PM-7AM, please contact night-coverage [email or web address] Password [email or web address] 05/17/2014, 1:10 PM   LOS: 3 days   I have personally examined this  patient and reviewed the entire database. I have reviewed the above note, made any necessary editorial changes, and agree with its content.  Cherene Altes, MD [facility]

## 2014-05-17 NOTE — Progress Notes (Signed)
Speech Language Pathology Treatment: Dysphagia  Patient Details Name: Dawn [name] MRN: [id number] DOB: 11-29-1945 Today's Date: 05/17/2014 Time: 6063-0160 SLP Time Calculation (min): 24 min  Assessment / Plan / Recommendation Clinical Impression  Pt awake but minimally responsive to stimuli. SLP provided total assist for appropriate posture and head positioning for PO trials. Pt initially responds to tactile sensation of spoon to mouth with brief labial closure but no lingual manipulation or transit of bolus observed. With total assist for labial closure and posterior head tilt pt was able to trigger a significantly delayed swallow response with 3 trials of ice cream. Severe oral and likely oropharyngeal residual remain. Attempted straw sips, again with initial oral response, but no active transit of bolus. Pts swallow function appears primarily impacted by mental status. Swallow response is present, but is mostly reflexive at this time, not active for safe PO intake. Aspiration risk is high. Continue to recommend NPO.    HPI HPI: Pt is a 68 y.o. female w/ PMH of Seizures; Osteoporosis; Thyroid disease; High cholesterol; Cataracts, bilateral; Optic atrophy; Paraparesis; Hypothyroidism; Anginal pain; and Coronary artery disease. Baseline intellectual disability, wheelchair bound, nonverbal. stopped eating past few days and seemed unable to swallow and has been pocketing food. Has had similar episodes in the past and has recovered. Likely UTI. Suspected aspiration but CXR clear. Normally on mech soft diet/ thin but downgraded to NTL at facility. Treated for PNA 2 weeks ago. Bedside swallow eval ordered to assess swallow function given hx of possible dysphagia.    Pertinent Vitals NA  SLP Plan  Continue with current plan of care    Recommendations Diet recommendations: NPO Medication Administration: Via alternative means              Oral Care Recommendations: Oral care BID Follow up  Recommendations: Skilled Nursing facility Plan: Continue with current plan of care    GO    [facility], MA CCC-SLP [phone]  [name] 05/17/2014, 9:37 AM

## 2014-05-18 ENCOUNTER — Inpatient Hospital Stay (HOSPITAL_COMMUNITY): Payer: Medicare Other

## 2014-05-18 DIAGNOSIS — D696 Thrombocytopenia, unspecified: Secondary | ICD-10-CM

## 2014-05-18 DIAGNOSIS — E876 Hypokalemia: Secondary | ICD-10-CM

## 2014-05-18 DIAGNOSIS — R29810 Facial weakness: Secondary | ICD-10-CM

## 2014-05-18 LAB — CBC
HEMATOCRIT: 38.2 % (ref 36.0–46.0)
Hemoglobin: 12.9 g/dL (ref 12.0–15.0)
MCH: 30.2 pg (ref 26.0–34.0)
MCHC: 33.8 g/dL (ref 30.0–36.0)
MCV: 89.5 fL (ref 78.0–100.0)
Platelets: 42 10*3/uL — ABNORMAL LOW (ref 150–400)
RBC: 4.27 MIL/uL (ref 3.87–5.11)
RDW: 17 % — AB (ref 11.5–15.5)
WBC: 17 10*3/uL — ABNORMAL HIGH (ref 4.0–10.5)

## 2014-05-18 LAB — BASIC METABOLIC PANEL
Anion gap: 9 (ref 5–15)
Anion gap: 8 (ref 5–15)
BUN: 18 mg/dL (ref 6–23)
BUN: 13 mg/dL (ref 6–23)
CALCIUM: 7.4 mg/dL — AB (ref 8.4–10.5)
CHLORIDE: 118 meq/L — AB (ref 96–112)
CO2: 27 meq/L (ref 19–32)
CO2: 26 mEq/L (ref 19–32)
CREATININE: 1.1 mg/dL (ref 0.50–1.10)
Calcium: 7.7 mg/dL — ABNORMAL LOW (ref 8.4–10.5)
Chloride: 117 mEq/L — ABNORMAL HIGH (ref 96–112)
Creatinine, Ser: 0.84 mg/dL (ref 0.50–1.10)
GFR calc Af Amer: 59 mL/min — ABNORMAL LOW (ref >90)
GFR calc non Af Amer: 51 mL/min — ABNORMAL LOW (ref >90)
GFR, EST AFRICAN AMERICAN: 82 mL/min — AB (ref >90)
GFR, EST NON AFRICAN AMERICAN: 70 mL/min — AB (ref >90)
GLUCOSE: 129 mg/dL — AB (ref 70–99)
GLUCOSE: 118 mg/dL — AB (ref 70–99)
POTASSIUM: 3.6 meq/L — AB (ref 3.7–5.3)
Potassium: 3.3 mEq/L — ABNORMAL LOW (ref 3.7–5.3)
Sodium: 154 mEq/L — ABNORMAL HIGH (ref 137–147)
Sodium: 151 mEq/L — ABNORMAL HIGH (ref 137–147)

## 2014-05-18 LAB — MAGNESIUM: Magnesium: 1.8 mg/dL (ref 1.5–2.5)

## 2014-05-18 MED ORDER — POTASSIUM CHLORIDE 10 MEQ/100ML IV SOLN
10.0000 meq | INTRAVENOUS | Status: AC
  Administered 2014-05-18 (×4): 10 meq via INTRAVENOUS
  Filled 2014-05-18 (×3): qty 100

## 2014-05-18 NOTE — Progress Notes (Signed)
[facility] TEAM 1 - Stepdown/ICU TEAM Progress Note  Dawn [name] SAY:[id number] DOB: 10-21-1945 DOA: 05/14/2014 PCP: [name]  Admit HPI / Brief Narrative: Dawn [name] is a 68 y.o. PMH of Seizures; diastolic CHF, atrial fibrillation, pituitary microadenoma (diagnosed 2009), hypothyroidism; High cholesterol and CAD. She also has developmental disability and at baseline non verbal and wheelchair bound status  She presented after staff at the group home noticed she had stopped eating for the past few days and seemed to be unable to swallow. No apparent fever or chills. She had been pocketing food in her mouth.  Had dental visit the previous week. Reportedly she had similar episodes of these symptoms in the past and recovered.   Upon arrival to emerge department patient was noted to be hypotensive (SBP down to 70s). She was given IV fluids and after 2 L blood pressure improved to her baseline of 110s. Sister was at the bedside so the admitting MD discussed goals of care. Family wished for her to be limited code. Patient appeared to have urinary tract infection leading to sepsis. Zosyn and vancomycin had been started by the EDP. There was also concern over possible aspiration although chest x-ray was unremarkable. She had been on mechanical soft diet for years with thin fluids but for several days prior to presentation she was switched to nectar thick due to concerns of active aspiration.dysphagia progression. For the 24 hours prior to presenatation she had stopped eating and drinking altogether. Per group home 2 weeks ago patient was treated for mild pneumonia.   HPI/Subjective: More awake today but seemed sleepy earlier this am- later when SLP evaluated pt she was more awake and able to smile- noted ? Right facial droop  Assessment/Plan:  Sepsis/septic shock due to E.Coli UTI -Felt to be volume depleted based on negative balance on I/O -Source appears to be E. Coli urinary tract  infection continue empiric anbx's-consider narrow and dc Vancomycin -Lactic Acid has decreased from 2.8 to 1.6 -BP remains soft but suspect remains volume depleted as evidenced by ongonig hypernatremia and low CVP (3-4) so continue IVFs-Dopamine has been weaned and dc'd -Blood cx NGTD  ? Right facial droop -possible low perfusion CVA in setting of recent hypotension -Non contrasted CT Head normal 8/4  Hypernatremia with dehydration -Was bolused with 2 L D5W on 8/2 but hypernatremia persists -changed to D51/2 NS on 8/3 and increased rate to 150 cc/hr -Na slowly trending down -follow lytes q 12 hours  Hypokalemia -follow lytes and replete prn -Mg stable  Symptomatic anemia -8/1 Transfused 3 units PRBC; anemia resolved and now hgb 12  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- follow  Chronic Diastolic heart failure  -Patient will need to be monitored closely for Fluid overload with rehydration  Paroxysmal atrial fibrillation  - Currrently in NSR   Dehydration  -Continue aggressive hydration, will have to monitor closely for fluid overload  Hypocalcemia -Corrected calcium = 9.6  Seizure disorder  -Continue Keppra and Depacon -Valproic acid level 63.8 -Depacon level = 62 point   DVT Prophylaxis: SCDs Code Status: DNR Family Communication: [name] [phone] via phone who is patient's guardian on home. Goals of care reviewed by MD on 8/2 at which time code status was changed to DNR-Today no family at bedside Disposition Plan: Resolution septic shock  Consultants: NA  Procedure/Significant Events: 1/15 echocardiogram - Left ventricle: mild focal basal hypertrophy of the septum. -LVEF= 60% to 65%.  -(grade 2 diastolic dysfunction).  8./4 CT  head without contrast;No acute intracranial abnormality. Prominent atrophy.    Culture 7/31 urine cx: E Coli resistant to Ampicillin 7/31 blood cx x2 NGTD 7/31 MRSA by PCR negative  Antibiotics: Zosyn  7/31>> Vancomycin 7/31>>  DVT prophylaxis: SCD  Devices NA   LINES / TUBES:  7/31 ga left antecubital>> DC 8/1 8./1 22ga right forearm>> DC 8/1 8/1 20ga left EJ>> DC 8/1 8/1 triple-lumen CVC left subclavian  Objective: VITAL SIGNS: Temp: 97.5 F (36.4 C) (08/04 0818) Temp src: Axillary (08/04 0818) BP: 99/47 mmHg (08/04 0818) Pulse Rate: 74 (08/04 0818) SPO2; 100% on 2 L via [place] FIO2:   Intake/Output Summary (Last 24 hours) at 05/18/14 1054 Last data filed at 05/18/14 2229  Gross per 24 hour  Intake   2450 ml  Output   2250 ml  Net    200 ml   Exam: General: Awake, does not follow commands, remains in fetal position (per Ms. Sellers this is her baseline), No acute respiratory distress ENT: No jaw or facial swelling or cervical adenopathy, no obvious erythema at gumline Lungs: Clear to auscultation bilaterally without wheezes or crackles, RA Cardiovascular:  Regular rhythm and rate without murmur gallop or rub normal S1 and S2-SBP soft on Dopamine gtt Abdomen: Nontender, nondistended, soft, bowel sounds positive, no rebound, no ascites, no appreciable mass Extremities: No significant cyanosis, clubbing. 2+ pitting edema primarily feet which may be chronic given immobile status at baseline  Data Reviewed: Basic Metabolic Panel:  Recent Labs Lab 05/16/14 1503 05/16/14 1745 05/17/14 0026 05/17/14 0611 05/17/14 1845 05/18/14 0342  NA  --  157* 155* 155* 155* 154*  K 2.5* 3.3* 3.9 3.8 3.6* 3.3*  CL  --  116* 117* 116* 120* 118*  CO2  --  29 28 30 27 27   GLUCOSE  --  153* 110* 109* 109* 129*  BUN  --  22 20 20 20 18   CREATININE  --  1.19* 1.05 1.08 1.13* 1.10  CALCIUM  --  7.7* 7.7* 8.0* 7.4* 7.4*  MG 2.0  --   --  1.8  --  1.8   Liver Function Tests:  Recent Labs Lab 05/14/14 1905 05/15/14 0600 05/15/14 0740 05/15/14 2119  AST 40* 32  --  36  ALT 13 12  --  18  ALKPHOS 108 70  --  92  BILITOT 0.3 0.3  --  1.2  PROT 6.9 5.6*  --  7.0  ALBUMIN 1.1*  1.8* 2.0* 2.2*   No results found for this basename: LIPASE, AMYLASE,  in the last 168 hours No results found for this basename: AMMONIA,  in the last 168 hours CBC:  Recent Labs Lab 05/14/14 1746 05/14/14 1912 05/15/14 0740 05/15/14 2119 05/18/14 0345  WBC 27.2*  --  15.4* 19.3* 17.0*  NEUTROABS 20.9*  --  12.7* 17.3*  --   HGB 9.3* 7.5* 5.2* 13.5 12.9  HCT 29.6* 22.0* 17.3* 39.8 38.2  MCV 97.7  --  99.4 90.0 89.5  PLT 159  --  70* 80* 42*   Cardiac Enzymes:  Recent Labs Lab 05/15/14 0056 05/15/14 0600 05/15/14 2119  TROPONINI <0.30 <0.30 <0.30   BNP (last 3 results)  Recent Labs  10/28/13 1547  PROBNP 488.2*   CBG: No results found for this basename: GLUCAP,  in the last 168 hours  Recent Results (from the past 240 hour(s))  URINE CULTURE     Status: None   Collection Time    05/14/14  5:19 PM  Result Value Ref Range Status   Specimen Description URINE, CATHETERIZED   Final   Special Requests NONE   Final   Culture  Setup Time     Final   Value: 05/14/2014 21:39     Performed at [facility]     Final   Value: >=100,000 COLONIES/ML     Performed at [facility]   Culture     Final   Value: ESCHERICHIA COLI     Performed at [facility]   Report Status 05/16/2014 FINAL   Final   Organism ID, Bacteria ESCHERICHIA COLI   Final  CULTURE, BLOOD (ROUTINE X 2)     Status: None   Collection Time    05/14/14  5:47 PM      Result Value Ref Range Status   Specimen Description BLOOD RIGHT ANTECUBITAL   Final   Special Requests BOTTLES DRAWN AEROBIC AND ANAEROBIC 2CC   Final   Culture  Setup Time     Final   Value: 05/14/2014 21:13     Performed at [facility]   Culture     Final   Value:        BLOOD CULTURE RECEIVED NO GROWTH TO DATE CULTURE WILL BE HELD FOR 5 DAYS BEFORE ISSUING A FINAL NEGATIVE REPORT     Performed at [facility]   Report Status PENDING   Incomplete  CULTURE, BLOOD (ROUTINE X  2)     Status: None   Collection Time    05/14/14  5:47 PM      Result Value Ref Range Status   Specimen Description BLOOD RIGHT ANTECUBITAL   Final   Special Requests BOTTLES DRAWN AEROBIC AND ANAEROBIC 2CC   Final   Culture  Setup Time     Final   Value: 05/14/2014 21:13     Performed at [facility]   Culture     Final   Value:        BLOOD CULTURE RECEIVED NO GROWTH TO DATE CULTURE WILL BE HELD FOR 5 DAYS BEFORE ISSUING A FINAL NEGATIVE REPORT     Performed at [facility]   Report Status PENDING   Incomplete  MRSA PCR SCREENING     Status: None   Collection Time    05/14/14 10:29 PM      Result Value Ref Range Status   MRSA by PCR NEGATIVE  NEGATIVE Final   Comment:            The GeneXpert MRSA Assay (FDA     approved for NASAL specimens     only), is one component of a     comprehensive MRSA colonization     surveillance program. It is not     intended to diagnose MRSA     infection nor to guide or     monitor treatment for     MRSA infections.     Studies:  Recent x-ray studies have been reviewed in detail by the Attending Physician  Scheduled Meds:  Scheduled Meds: . antiseptic oral rinse  7 mL Mouth Rinse BID  . cefTRIAXone (ROCEPHIN)  IV  1 g Intravenous Q24H  . chlorhexidine  15 mL Mouth Rinse BID  . levETIRAcetam  750 mg Intravenous 2 times per day  . levETIRAcetam  500 mg Intravenous Q24H  . levothyroxine  37.5 mcg Intravenous Daily  . pantoprazole (PROTONIX) IV  40 mg Intravenous QHS  . valproate  sodium  750 mg Intravenous 3 times per day    Time spent on care of this patient: 35 mins   ELLIS,ALLISON L. , ANP  [facility] Office  [phone] Pager - [phone]  On-Call/Text Page:      [email or web address]      password [email or web address]  If 7PM-7AM, please contact night-coverage [email or web address] Password [email or web address] 05/18/2014, 10:54 AM   LOS: 4 days    Examining patient and discussed assessment and plan with ANP Ebony Hail and agree with the  above. Patient with multiple complex medical problems per patient care>35 min

## 2014-05-18 NOTE — Progress Notes (Deleted)
INITIAL NUTRITION ASSESSMENT  DOCUMENTATION CODES Per approved criteria  -Not Applicable   INTERVENTION: Advance diet as medically appropriate  If TF started, recommend Jevity 1.2 formula -- initiate at 15 ml/hr and increase by 10 ml every 4 hours to goal rate of 55 ml/hr to provide 1584 kcals, 73 gm protein, 1065 ml of free water RD to follow for nutrition care plan  NUTRITION DIAGNOSIS: Inadequate oral intake related to inability to eat as evidenced by NPO status  Goal: Pt to meet >/= 90% of their estimated nutrition needs   Monitor:  PO diet advancement & intake, weight, labs, I/O's  Reason for Assessment: Low Braden  68 y.o. female  Admitting Dx: stopped eating, hypotension   ASSESSMENT: 68 y.o. Female with PMH of seizures, osteoporosis, thyroid disease, and CAD; admitted with sepsis due to UTI and possible aspiration, severe dehydration due to poor by mouth intake likely secondary to severe tooth decay resulted in hypernatremia.   RD unable to obtain nutrition hx; patient nonverbal; presented from her group home after she had stopped eating for the past few days and seemed unable to swallow; s/p bedside swallow evaluation 8/2 pt at severe aspiration risk, not ready for PO diet; question whether patient with need temporary means of nutrition.  Low braden score places patient at risk for skin breakdown.  RD unable to complete Nutrition Focused Physical Exam at this time.  Height: Ht Readings from Last 1 Encounters:  05/14/14 5' 1.81" (1.57 m)    Weight: Wt Readings from Last 1 Encounters:  05/17/14 131 lb 13.4 oz (59.8 kg)    Ideal Body Weight: 105 lb  % Ideal Body Weight: 125%  Wt Readings from Last 10 Encounters:  05/17/14 131 lb 13.4 oz (59.8 kg)  12/29/13 125 lb 3.2 oz (56.79 kg)  12/21/13 134 lb 0.6 oz (60.8 kg)  11/01/13 137 lb 2 oz (62.2 kg)  02/12/12 116 lb (52.617 kg)    Usual Body Weight: 134 lb  % Usual Body Weight: 97%  BMI:  Body mass index  is 24.26 kg/(m^2).  Estimated Nutritional Needs: Kcal: 1500-1700 Protein: 70-80 gm Fluid: 1.5-1.7 L  Skin: Intact  Diet Order: NPO  EDUCATION NEEDS: -No education needs identified at this time   Intake/Output Summary (Last 24 hours) at 05/18/14 1143 Last data filed at 05/18/14 0821  Gross per 24 hour  Intake   2450 ml  Output   2250 ml  Net    200 ml   Labs:   Recent Labs Lab 05/16/14 1503  05/17/14 0611 05/17/14 1845 05/18/14 0342  NA  --   < > 155* 155* 154*  K 2.5*  < > 3.8 3.6* 3.3*  CL  --   < > 116* 120* 118*  CO2  --   < > 30 27 27   BUN  --   < > 20 20 18   CREATININE  --   < > 1.08 1.13* 1.10  CALCIUM  --   < > 8.0* 7.4* 7.4*  MG 2.0  --  1.8  --  1.8  GLUCOSE  --   < > 109* 109* 129*  < > = values in this interval not displayed.  Scheduled Meds: . antiseptic oral rinse  7 mL Mouth Rinse BID  . cefTRIAXone (ROCEPHIN)  IV  1 g Intravenous Q24H  . chlorhexidine  15 mL Mouth Rinse BID  . levETIRAcetam  750 mg Intravenous 2 times per day  . levETIRAcetam  500 mg Intravenous Q24H  .  levothyroxine  37.5 mcg Intravenous Daily  . pantoprazole (PROTONIX) IV  40 mg Intravenous QHS  . potassium chloride  10 mEq Intravenous Q1 Hr x 4  . valproate sodium  750 mg Intravenous 3 times per day    Continuous Infusions: . dextrose 5 % and 0.45% NaCl 150 mL/hr at 05/18/14 2197    Past Medical History  Diagnosis Date  . Seizures   . Osteoporosis   . Thyroid disease     hypothyroid  . High cholesterol   . Cataracts, bilateral   . Optic atrophy   . Paraparesis     mild  . Hypothyroidism   . Anginal pain   . Coronary artery disease     Past Surgical History  Procedure Laterality Date  . Midline incision    . Central venous catheter insertion  05/15/2014         Arthur Holms, RD, LDN Pager #: [phone] After-Hours Pager #: [phone]

## 2014-05-18 NOTE — Care Management Note (Signed)
    Page 1 of 1   05/18/2014     10:08:47 AM CARE MANAGEMENT NOTE 05/18/2014  Patient:  Dawn [name], Dawn [name]   Account Number:  [id number]  Date Initiated:  05/18/2014  Documentation initiated by:  Cheila Wickstrom  Subjective/Objective Assessment:   dx hypotension; resident of group home     In-house referral  [place] consult      Medicare Important Message given?  YES (If response is "NO", the following Medicare IM given date fields will be blank) Date Medicare IM given:  05/17/2014 Medicare IM given by:  Jaslynn Thome Date Additional Medicare IM given:   Additional Medicare IM given by:

## 2014-05-18 NOTE — Progress Notes (Addendum)
INITIAL NUTRITION ASSESSMENT  DOCUMENTATION CODES Per approved criteria  -Not Applicable   INTERVENTION: Advance diet as medically appropriate  If TF started, recommend Jevity 1.2 formula -- initiate at 15 ml/hr and increase by 10 ml every 4 hours to goal rate of 55 ml/hr to provide 1584 kcals, 73 gm protein, 1065 ml of free water RD to follow for nutrition care plan  NUTRITION DIAGNOSIS: Inadequate oral intake related to inability to eat as evidenced by NPO status  Goal: Pt to meet >/= 90% of their estimated nutrition needs   Monitor:  PO diet advancement & intake, weight, labs, I/O's  Reason for Assessment: Low Braden  68 y.o. female  Admitting Dx: stopped eating, hypotension   ASSESSMENT: 68 y.o. Female with PMH of seizures, osteoporosis, thyroid disease, and CAD; admitted with sepsis due to UTI and possible aspiration, severe dehydration due to poor by mouth intake likely secondary to severe tooth decay resulted in hypernatremia.   RD unable to obtain nutrition hx; patient nonverbal; presented from her group home after she had stopped eating for the past few days and seemed unable to swallow; s/p bedside swallow evaluation 8/2 pt at severe aspiration risk, not ready for PO diet; question initiation of temporary means of nutrition.  Low braden score places patient at risk for skin breakdown.  RD unable to complete Nutrition Focused Physical Exam at this time.  Height: Ht Readings from Last 1 Encounters:  05/14/14 5' 1.81" (1.57 m)    Weight: Wt Readings from Last 1 Encounters:  05/17/14 131 lb 13.4 oz (59.8 kg)    Ideal Body Weight: 105 lb  % Ideal Body Weight: 125%  Wt Readings from Last 10 Encounters:  05/17/14 131 lb 13.4 oz (59.8 kg)  12/29/13 125 lb 3.2 oz (56.79 kg)  12/21/13 134 lb 0.6 oz (60.8 kg)  11/01/13 137 lb 2 oz (62.2 kg)  02/12/12 116 lb (52.617 kg)    Usual Body Weight: 134 lb  % Usual Body Weight: 97%  BMI:  Body mass index is 24.26  kg/(m^2).  Estimated Nutritional Needs: Kcal: 1500-1700 Protein: 70-80 gm Fluid: 1.5-1.7 L  Skin: Intact  Diet Order: NPO  EDUCATION NEEDS: -No education needs identified at this time   Intake/Output Summary (Last 24 hours) at 05/18/14 1133 Last data filed at 05/18/14 0821  Gross per 24 hour  Intake   2450 ml  Output   2250 ml  Net    200 ml   Labs:   Recent Labs Lab 05/16/14 1503  05/17/14 0611 05/17/14 1845 05/18/14 0342  NA  --   < > 155* 155* 154*  K 2.5*  < > 3.8 3.6* 3.3*  CL  --   < > 116* 120* 118*  CO2  --   < > 30 27 27   BUN  --   < > 20 20 18   CREATININE  --   < > 1.08 1.13* 1.10  CALCIUM  --   < > 8.0* 7.4* 7.4*  MG 2.0  --  1.8  --  1.8  GLUCOSE  --   < > 109* 109* 129*  < > = values in this interval not displayed.  Scheduled Meds: . antiseptic oral rinse  7 mL Mouth Rinse BID  . cefTRIAXone (ROCEPHIN)  IV  1 g Intravenous Q24H  . chlorhexidine  15 mL Mouth Rinse BID  . levETIRAcetam  750 mg Intravenous 2 times per day  . levETIRAcetam  500 mg Intravenous Q24H  .  levothyroxine  37.5 mcg Intravenous Daily  . pantoprazole (PROTONIX) IV  40 mg Intravenous QHS  . potassium chloride  10 mEq Intravenous Q1 Hr x 4  . valproate sodium  750 mg Intravenous 3 times per day    Continuous Infusions: . dextrose 5 % and 0.45% NaCl 150 mL/hr at 05/18/14 0569    Past Medical History  Diagnosis Date  . Seizures   . Osteoporosis   . Thyroid disease     hypothyroid  . High cholesterol   . Cataracts, bilateral   . Optic atrophy   . Paraparesis     mild  . Hypothyroidism   . Anginal pain   . Coronary artery disease     Past Surgical History  Procedure Laterality Date  . Midline incision    . Central venous catheter insertion  05/15/2014         Arthur Holms, RD, LDN Pager #: [phone] After-Hours Pager #: [phone]

## 2014-05-18 NOTE — Procedures (Signed)
After explaining the risk and benefits over the phone to Dawn [name] [phone] permission was granted to place central venous access.  The area was prepared in a sterile fashion using chlorhexidine, and sterile drape, all persons maintained sterility during procedure. A timeout was conducted to confirm site of procedure. 1% lidocaine without epinephrine was used for local anesthesia, and a left subclavian central venous line (triple-lumen) was successfully placed. A stat CXR was obtained in order to confirm placement, and line is certified for use. Minimal blood loss, patient tolerated procedure well.

## 2014-05-18 NOTE — Progress Notes (Signed)
Speech Language Pathology Treatment: Dysphagia  Patient Details Name: Dawn [name] MRN: [id number] DOB: [birth year] Today's Date: 05/18/2014 Time: 9381-0175 SLP Time Calculation (min): 17 min  Assessment / Plan / Recommendation Clinical Impression  Pt demonstrates improved arousal today. Pt making active attempts to consume PO, taking sips through a straw, though she is still unable to sustain any labial closure or lingually manipulate bolus for posterior propulsion. Pt demonstrated a few reflexive swallows but the majority of every bolus spilled from mouth. Pt is improving, but still not appropriate for PO diet. Discussed question of right labial/lingal weakness with MD.    HPI HPI: Pt is a 68 y.o. female w/ PMH of Seizures; Osteoporosis; Thyroid disease; High cholesterol; Cataracts, bilateral; Optic atrophy; Paraparesis; Hypothyroidism; Anginal pain; and Coronary artery disease. Baseline intellectual disability, wheelchair bound, nonverbal. stopped eating past few days and seemed unable to swallow and has been pocketing food. Has had similar episodes in the past and has recovered. Likely UTI. Suspected aspiration but CXR clear. Normally on mech soft diet/ thin but downgraded to NTL at facility. Treated for PNA 2 weeks ago. Bedside swallow eval ordered to assess swallow function given hx of possible dysphagia.    Pertinent Vitals NA  SLP Plan  Continue with current plan of care    Recommendations Diet recommendations: NPO Medication Administration: Via alternative means              Oral Care Recommendations: Oral care BID Follow up Recommendations: Skilled Nursing facility Plan: Continue with current plan of care    GO    [facility], [place] CCC-SLP [phone]  [name] 05/18/2014, 9:23 AM

## 2014-05-19 ENCOUNTER — Inpatient Hospital Stay (HOSPITAL_COMMUNITY): Payer: Medicare Other

## 2014-05-19 LAB — CBC WITH DIFFERENTIAL/PLATELET
Basophils Absolute: 0 10*3/uL (ref 0.0–0.1)
Basophils Relative: 0 % (ref 0–1)
EOS PCT: 0 % (ref 0–5)
Eosinophils Absolute: 0 10*3/uL (ref 0.0–0.7)
HEMATOCRIT: 40.3 % (ref 36.0–46.0)
Hemoglobin: 13.3 g/dL (ref 12.0–15.0)
LYMPHS ABS: 1.5 10*3/uL (ref 0.7–4.0)
LYMPHS PCT: 10 % — AB (ref 12–46)
MCH: 30.6 pg (ref 26.0–34.0)
MCHC: 33 g/dL (ref 30.0–36.0)
MCV: 92.6 fL (ref 78.0–100.0)
MONO ABS: 1.1 10*3/uL — AB (ref 0.1–1.0)
Monocytes Relative: 8 % (ref 3–12)
Neutro Abs: 11.7 10*3/uL — ABNORMAL HIGH (ref 1.7–7.7)
Neutrophils Relative %: 82 % — ABNORMAL HIGH (ref 43–77)
Platelets: 31 10*3/uL — ABNORMAL LOW (ref 150–400)
RBC: 4.35 MIL/uL (ref 3.87–5.11)
RDW: 17.1 % — ABNORMAL HIGH (ref 11.5–15.5)
WBC: 14.3 10*3/uL — AB (ref 4.0–10.5)

## 2014-05-19 LAB — BASIC METABOLIC PANEL
Anion gap: 10 (ref 5–15)
BUN: 11 mg/dL (ref 6–23)
CALCIUM: 7.7 mg/dL — AB (ref 8.4–10.5)
CO2: 25 meq/L (ref 19–32)
Chloride: 119 mEq/L — ABNORMAL HIGH (ref 96–112)
Creatinine, Ser: 0.76 mg/dL (ref 0.50–1.10)
GFR calc Af Amer: >90 mL/min (ref >90)
GFR calc non Af Amer: 85 mL/min — ABNORMAL LOW (ref >90)
GLUCOSE: 136 mg/dL — AB (ref 70–99)
Potassium: 3.6 mEq/L — ABNORMAL LOW (ref 3.7–5.3)
Sodium: 154 mEq/L — ABNORMAL HIGH (ref 137–147)

## 2014-05-19 LAB — LEVETIRACETAM LEVEL: LEVETIRACETAM: 54.2 ug/mL

## 2014-05-19 MED ORDER — JEVITY 1.2 CAL PO LIQD
1000.0000 mL | ORAL | Status: DC
Start: 2014-05-19 — End: 2014-06-09
  Administered 2014-05-19 – 2014-05-21 (×3): 1000 mL
  Administered 2014-05-22: 08:00:00
  Administered 2014-05-24 – 2014-06-02 (×9): 1000 mL
  Administered 2014-06-03: 22:00:00
  Administered 2014-06-04 (×2): 1000 mL
  Administered 2014-06-05: 15:00:00
  Administered 2014-06-06 – 2014-06-09 (×4): 1000 mL
  Filled 2014-05-19 (×28): qty 1000

## 2014-05-19 MED ORDER — FREE WATER
200.0000 mL | Freq: Four times a day (QID) | Status: DC
  Administered 2014-05-19 – 2014-06-09 (×74): 200 mL

## 2014-05-19 MED ORDER — FREE WATER
100.0000 mL | Freq: Three times a day (TID) | Status: DC
  Administered 2014-05-19: 100 mL

## 2014-05-19 MED ORDER — DEXTROSE 5 % IV SOLN
INTRAVENOUS | Status: DC
  Administered 2014-05-19: 1000 mL via INTRAVENOUS
  Administered 2014-05-21 – 2014-05-26 (×3): via INTRAVENOUS

## 2014-05-19 NOTE — Progress Notes (Signed)
NGT was inserted as ordered.  KUB done and Ms. Erin Hearing, NP was called to inform her the KUB result was in.  While talking to her, she ordered to place the mittens on so pt will not pull the tube out and when I went to see the patient, NGT is out lying on top of her.  Her mittens were off at that time.  Another NGT is reinserted, mittens were applied.

## 2014-05-19 NOTE — Progress Notes (Signed)
Ms. Dawn Hearing, NP read the KUB result and ordered that tube feeding can be started.

## 2014-05-19 NOTE — Progress Notes (Signed)
Dawn [name] - Stepdown/ICU TEAM Progress Note  Dawn [name] TGG:[id number] DOB: 06/21/1946 DOA: 05/14/2014 PCP: [name]  Admit HPI / Brief Narrative: 68 y.o. PMH of Seizures; diastolic CHF, atrial fibrillation, pituitary microadenoma (diagnosed 2009), hypothyroidism; High cholesterol and CAD. She also has developmental disability and at baseline non verbal and wheelchair bound status  She presented after staff at the group home noticed she had stopped eating for the past few days and seemed to be unable to swallow. No apparent fever or chills. She had been pocketing food in her mouth.  Had dental visit the previous week. Reportedly she had similar episodes of these symptoms in the past and recovered.   Upon arrival to emerge department patient was noted to be hypotensive (SBP down to 70s). She was given IV fluids and after 2 L blood pressure improved to her baseline of 110s. Sister was at the bedside so the admitting MD discussed goals of care. Family wished for her to be limited code. Patient appeared to have urinary tract infection leading to sepsis. Zosyn and vancomycin had been started by the EDP. There was also concern over possible aspiration although chest x-ray was unremarkable. She had been on mechanical soft diet for years with thin fluids but for several days prior to presentation she was switched to nectar thick due to concerns of active aspiration.dysphagia progression. For the 24 hours prior to presenatation she had stopped eating and drinking altogether. Per group home 2 weeks ago patient was treated for mild pneumonia.   HPI/Subjective: Awake and sitting up in bed with eyes open - grunting but not able to communicate effectively.    Assessment/Plan:  Sepsis/septic shock due to E.Coli UTI -Source appears to be E. Coli urinary tract infection - continue anbx -Lactic Acid has decreased from 2.8 to [name].6 -Blood cx NGTD  ? Right facial droop -possible low  perfusion CVA in setting of recent hypotension -non contrasted CT Head normal 8/4 -suspect this is her baseline facies    Cerebral palsy/Acute metabolic encephalopathy/dysphagia -has chronic dysphagia  -SLP has evaluated for several days and continue to recommend alternate route for diet/meds -today is day #6 so will place feeding tube and begin feeds  Hypernatremia with dehydration -Was bolused with 2 L D5W on 8/2 but hypernatremia persists -changed to D51/2 NS on 8/3 and increased rate to 150 cc/hr but Na has risen again so will change to D5W and will also add free water per tube once PANDA placed  Hypokalemia -follow lytes and replete prn -Mg stable  Symptomatic anemia -8/[name] Transfused 3 units PRBC; anemia resolved and now hgb >12  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- follow trends  Chronic Diastolic heart failure  -Patient will need to be monitored closely for fluid overload with rehydration  Paroxysmal atrial fibrillation  -Currrently in NSR   Dehydration  -Continue hydration, will have to monitor closely for fluid overload  Hypocalcemia -Corrected calcium = 9.6  Seizure disorder  -Continue Keppra and Depacon -Valproic acid level 63.8 -Depacon level = 62 point   DVT Prophylaxis: SCDs Code Status: DNR Family Communication: Dawn [name] [phone] Goals of care reviewed by MD on 8/2 at which time code status was changed to DNR - no family at bedside 8/5 Disposition Plan: SDU  Consultants: NA  Procedure/Significant Events: [name]/15 echocardiogram - Left ventricle: mild focal basal hypertrophy of the septum. -LVEF= 60% to 65%.  -(grade 2 diastolic dysfunction).  8./4 CT head without contrast;No acute intracranial abnormality. Prominent  atrophy.  Culture 7/31 urine cx: E Coli resistant to Ampicillin 7/31 blood cx x2 NGTD 7/31 MRSA by PCR negative  Antibiotics: Zosyn 7/31>>8/03 Vancomycin 7/31>>8/03 Rocephin 8/03 >>  DVT  prophylaxis: SCD  Devices NA   Objective: VITAL SIGNS: Temp: 97.4 F (36.3 C) (08/05 0738) Temp src: Axillary (08/05 0738) BP: 104/77 mmHg (08/05 0738) Pulse Rate: 84 (08/05 0800) SPO2; 100% on 2 L via Webber FIO2:  Intake/Output Summary (Last 24 hours) at 05/19/14 1128 Last data filed at 05/19/14 0800  Gross per 24 hour  Intake 3522.92 ml  Output   1500 ml  Net 2022.92 ml   Exam: General: In no respiratory distress-awake with eyes open- grunts (baseline) Lungs: Clear to auscultation bilaterally without wheezes or crackles, RA Cardiovascular:  Regular rhythm and rate without murmur gallop or rub  Abdomen: Nontender, nondistended, soft, bowel sounds positive, no rebound, no ascites, no appreciable mass Extremities: No significant cyanosis, clubbing. [name]+ edema  Data Reviewed: Basic Metabolic Panel:  Recent Labs Lab 05/16/14 1503  05/17/14 0611 05/17/14 1845 05/18/14 0342 05/18/14 1700 05/19/14 0449  NA  --   < > 155* 155* 154* 151* 154*  K 2.5*  < > 3.8 3.6* 3.3* 3.6* 3.6*  CL  --   < > 116* 120* 118* 117* 119*  CO2  --   < > 30 27 27 26 25   GLUCOSE  --   < > 109* 109* 129* 118* 136*  BUN  --   < > 20 20 18 13 11   CREATININE  --   < > [name].08 [name].13* [name].10 0.84 0.76  CALCIUM  --   < > 8.0* 7.4* 7.4* 7.7* 7.7*  MG 2.0  --  [name].8  --  [name].8  --   --   < > = values in this interval not displayed.  Liver Function Tests:  Recent Labs Lab 05/14/14 1905 05/15/14 0600 05/15/14 0740 05/15/14 2119  AST 40* 32  --  36  ALT 13 12  --  18  ALKPHOS 108 70  --  92  BILITOT 0.3 0.3  --  [name].2  PROT 6.9 5.6*  --  7.0  ALBUMIN [name].[name]* [name].8* 2.0* 2.2*   No results found for this basename: LIPASE, AMYLASE,  in the last 168 hours No results found for this basename: AMMONIA,  in the last 168 hours CBC:  Recent Labs Lab 05/14/14 1746 05/14/14 1912 05/15/14 0740 05/15/14 2119 05/18/14 0345 05/19/14 0449  WBC 27.2*  --  15.4* 19.3* 17.0* 14.3*  NEUTROABS 20.9*  --  12.7* 17.3*  --   11.7*  HGB 9.3* 7.5* 5.2* 13.5 12.9 13.3  HCT 29.6* 22.0* 17.3* 39.8 38.2 40.3  MCV 97.7  --  99.4 90.0 89.5 92.6  PLT 159  --  70* 80* 42* 31*   Cardiac Enzymes:  Recent Labs Lab 05/15/14 0056 05/15/14 0600 05/15/14 2119  TROPONINI <0.30 <0.30 <0.30   BNP (last 3 results)  Recent Labs  10/28/13 1547  PROBNP 488.2*   CBG: No results found for this basename: GLUCAP,  in the last 168 hours  Recent Results (from the past 240 hour(s))  URINE CULTURE     Status: None   Collection Time    05/14/14  5:19 PM      Result Value Ref Range Status   Specimen Description URINE, CATHETERIZED   Final   Special Requests NONE   Final   Culture  Setup Time     Final  Value: 05/14/2014 21:39     Performed at [facility]     Final   Value: >=100,000 COLONIES/ML     Performed at [facility]   Culture     Final   Value: ESCHERICHIA COLI     Performed at [facility]   Report Status 05/16/2014 FINAL   Final   Organism ID, Bacteria ESCHERICHIA COLI   Final  CULTURE, BLOOD (ROUTINE X 2)     Status: None   Collection Time    05/14/14  5:47 PM      Result Value Ref Range Status   Specimen Description BLOOD RIGHT ANTECUBITAL   Final   Special Requests BOTTLES DRAWN AEROBIC AND ANAEROBIC 2CC   Final   Culture  Setup Time     Final   Value: 05/14/2014 21:13     Performed at [facility]   Culture     Final   Value:        BLOOD CULTURE RECEIVED NO GROWTH TO DATE CULTURE WILL BE HELD FOR 5 DAYS BEFORE ISSUING A FINAL NEGATIVE REPORT     Performed at [facility]   Report Status PENDING   Incomplete  CULTURE, BLOOD (ROUTINE X 2)     Status: None   Collection Time    05/14/14  5:47 PM      Result Value Ref Range Status   Specimen Description BLOOD RIGHT ANTECUBITAL   Final   Special Requests BOTTLES DRAWN AEROBIC AND ANAEROBIC 2CC   Final   Culture  Setup Time     Final   Value: 05/14/2014 21:13     Performed at [facility]   Culture     Final   Value:        BLOOD CULTURE RECEIVED NO GROWTH TO DATE CULTURE WILL BE HELD FOR 5 DAYS BEFORE ISSUING A FINAL NEGATIVE REPORT     Performed at [facility]   Report Status PENDING   Incomplete  MRSA PCR SCREENING     Status: None   Collection Time    05/14/14 10:29 PM      Result Value Ref Range Status   MRSA by PCR NEGATIVE  NEGATIVE Final   Comment:            The GeneXpert MRSA Assay (FDA     approved for NASAL specimens     only), is one component of a     comprehensive MRSA colonization     surveillance program. It is not     intended to diagnose MRSA     infection nor to guide or     monitor treatment for     MRSA infections.     Studies:  Recent x-ray studies have been reviewed in detail by the Attending Physician  Scheduled Meds:  Scheduled Meds: . antiseptic oral rinse  7 mL Mouth Rinse BID  . cefTRIAXone (ROCEPHIN)  IV  [name] g Intravenous Q24H  . chlorhexidine  15 mL Mouth Rinse BID  . feeding supplement (JEVITY [name].2 CAL)  [name],000 mL Per Tube Q24H  . free water  100 mL Per Tube 3 times per day  . levETIRAcetam  750 mg Intravenous 2 times per day  . levETIRAcetam  500 mg Intravenous Q24H  . levothyroxine  37.5 mcg Intravenous Daily  . pantoprazole (PROTONIX) IV  40 mg Intravenous QHS  . valproate sodium  750 mg Intravenous 3 times per day  Time spent on care of this patient: 35 mins   ELLIS,ALLISON L. , ANP  [facility] Office  [phone] Pager - [phone]  On-Call/Text Page:      [email or web address]      password [email or web address]  If 7PM-7AM, please contact night-coverage [email or web address] Password [email or web address] 05/19/2014, 11:28 AM   LOS: 5 days    I have personally examined this patient and reviewed the entire database. I have reviewed the above note, made any necessary editorial changes, and agree with its content.  Cherene Altes, MD [facility]

## 2014-05-19 NOTE — Progress Notes (Signed)
Patient is on bilateral mittens.  She has several attempts to pull out the NGT.

## 2014-05-19 NOTE — Progress Notes (Signed)
Speech Language Pathology Treatment: Dysphagia  Patient Details Name: Dawn [name] MRN: [id number] DOB: 1946-08-04 Today's Date: 05/19/2014 Time: 1829-9371 SLP Time Calculation (min): 19 min  Assessment / Plan / Recommendation Clinical Impression  The pt again demonstrates modest improvements in swallow function. She is more aware and alert, smiling regularly, still with a noticeably decreased mobility of the right side of lips (head CT negative for acute findings). Pt eagerly responded to PO trials, closing lips and attempting to transit bolus, though lingual function still severely impaired with only minimal tongue thrusting and no functional oral transit noted despite SLP facilitating head position and tight labial closure.  All of puree boluses were suctioned from the oral cavity. She was suprisingly able to take straw sips of water, but could only consistently trigger the swallow when allowed to take large consecutive sips. Though there was no evidence of aspiration with these trials, the swallow was significantly delayed with high concern for aspiration. Pt has been NPO with no alternate means of nutrition for many days. Hopeful for continued improvement tomorrow, perhaps even enough to consider an MBS to objectively determine if pt could try thin liquids via straw. Still, suspect aspiration risk would be significant. Should pt have short term alternate method of feeding until consistent swallow recovery can be achieved?    HPI HPI: Pt is a 68 y.o. female w/ PMH of Seizures; Osteoporosis; Thyroid disease; High cholesterol; Cataracts, bilateral; Optic atrophy; Paraparesis; Hypothyroidism; Anginal pain; and Coronary artery disease. Baseline intellectual disability, wheelchair bound, nonverbal. stopped eating past few days and seemed unable to swallow and has been pocketing food. Has had similar episodes in the past and has recovered. Likely UTI. Suspected aspiration but CXR clear. Normally on mech  soft diet/ thin but downgraded to NTL at facility. Treated for PNA 2 weeks ago. Bedside swallow eval ordered to assess swallow function given hx of possible dysphagia.    Pertinent Vitals NA  SLP Plan  Continue with current plan of care    Recommendations Diet recommendations: NPO Medication Administration: Via alternative means              Oral Care Recommendations: Oral care BID Follow up Recommendations: Skilled Nursing facility Plan: Continue with current plan of care    GO    [facility], MA CCC-SLP [phone]  [name] 05/19/2014, 9:20 AM

## 2014-05-20 DIAGNOSIS — R4789 Other speech disturbances: Secondary | ICD-10-CM

## 2014-05-20 DIAGNOSIS — G808 Other cerebral palsy: Secondary | ICD-10-CM

## 2014-05-20 DIAGNOSIS — G9341 Metabolic encephalopathy: Secondary | ICD-10-CM

## 2014-05-20 LAB — CULTURE, BLOOD (ROUTINE X 2)
Culture: NO GROWTH
Culture: NO GROWTH

## 2014-05-20 LAB — CBC
HEMATOCRIT: 39.3 % (ref 36.0–46.0)
Hemoglobin: 13.1 g/dL (ref 12.0–15.0)
MCH: 29.8 pg (ref 26.0–34.0)
MCHC: 33.3 g/dL (ref 30.0–36.0)
MCV: 89.5 fL (ref 78.0–100.0)
Platelets: 31 10*3/uL — ABNORMAL LOW (ref 150–400)
RBC: 4.39 MIL/uL (ref 3.87–5.11)
RDW: 16.5 % — AB (ref 11.5–15.5)
WBC: 17.5 10*3/uL — ABNORMAL HIGH (ref 4.0–10.5)

## 2014-05-20 LAB — BASIC METABOLIC PANEL
Anion gap: 11 (ref 5–15)
BUN: 10 mg/dL (ref 6–23)
CALCIUM: 7.9 mg/dL — AB (ref 8.4–10.5)
CHLORIDE: 111 meq/L (ref 96–112)
CO2: 25 mEq/L (ref 19–32)
CREATININE: 0.82 mg/dL (ref 0.50–1.10)
GFR, EST AFRICAN AMERICAN: 84 mL/min — AB (ref >90)
GFR, EST NON AFRICAN AMERICAN: 72 mL/min — AB (ref >90)
Glucose, Bld: 115 mg/dL — ABNORMAL HIGH (ref 70–99)
Potassium: 3.2 mEq/L — ABNORMAL LOW (ref 3.7–5.3)
Sodium: 147 mEq/L (ref 137–147)

## 2014-05-20 MED ORDER — WHITE PETROLATUM GEL
Status: AC
  Administered 2014-05-20: 09:00:00
  Filled 2014-05-20: qty 5

## 2014-05-20 MED ORDER — POTASSIUM CHLORIDE 20 MEQ/15ML (10%) PO LIQD
40.0000 meq | Freq: Once | ORAL | Status: AC
  Administered 2014-05-20: 40 meq
  Filled 2014-05-20: qty 30

## 2014-05-20 NOTE — Progress Notes (Signed)
[place] TEAM 1 - Stepdown/ICU TEAM Progress Note  Dawn [name] GLO:[id number] DOB: January 02, 1946 DOA: 05/14/2014 PCP: [name]  Admit HPI / Brief Narrative: 68 y.o. PMH of Seizures; diastolic CHF, atrial fibrillation, pituitary microadenoma (diagnosed 2009), hypothyroidism; High cholesterol and CAD. She also has developmental disability and at baseline non verbal and wheelchair bound status  She presented after staff at the group home noticed she had stopped eating for the past few days and seemed to be unable to swallow. No apparent fever or chills. She had been pocketing food in her mouth.  Had dental visit the previous week. Reportedly she had similar episodes of these symptoms in the past and recovered.   Upon arrival to emerge department patient was noted to be hypotensive (SBP down to 70s). She was given IV fluids and after 2 L blood pressure improved to her baseline of 110s. Sister was at the bedside so the admitting MD discussed goals of care. Family wished for her to be limited code. Patient appeared to have urinary tract infection leading to sepsis. Zosyn and vancomycin had been started by the EDP. There was also concern over possible aspiration although chest x-ray was unremarkable. She had been on mechanical soft diet for years with thin fluids but for several days prior to presentation she was switched to nectar thick due to concerns of active aspiration.dysphagia progression. For the 24 hours prior to presenatation she had stopped eating and drinking altogether. Per group home 2 weeks ago patient was treated for mild pneumonia.   HPI/Subjective: Less awake and sitting up in bed-opens eyes to voice- grunting but not able to communicate effectively.    Assessment/Plan:  Sepsis/septic shock due to E.Coli UTI -Source E. Coli urinary tract infection - continue anbx -Lactic Acid decreased from 2.8 to 1.6 -Blood cx NGTD  ? Right facial droop -possible low perfusion CVA in  setting of recent hypotension -non contrasted CT Head normal 8/4 -suspect this is her baseline facies    Cerebral palsy/Acute metabolic encephalopathy/dysphagia -has chronic dysphagia  -SLP has evaluated for several days and continue to recommend alternate route for diet/meds -placed feeding tube 8/5 and began feeds  Hypernatremia with dehydration -Was bolused with 2 L D5W on 8/2 but hypernatremia persisted -changed to D51/2 NS on 8/3 and increased rate to 150 cc/hr  -Na had risen again on 8/5 so changed to D5W and added free water per tube -Na has normalized to 147 so will decrease rate of D5W and follow add'l 24hrs  Hypokalemia -follow lytes potassium 40 mEq x1 -Mg stable  Symptomatic anemia -8/1 Transfused 3 units PRBC; anemia resolved and now hgb >12  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 Dawn [name] [phone] Goals of care reviewed by MD on 8/2 at which time code status was changed to DNR - 8/6 Spoke at length with Dawn [name]- discussed need for temporary feeding tube and concerns over possible need for PEG-made aware that PEG may only prolong her life in setting of progressive dementia Disposition Plan: SDU  Consultants: NA  Procedure/Significant Events: 1/15  echocardiogram - Left ventricle: mild focal basal hypertrophy of the septum. -LVEF= 60% to 65%.  -(grade 2 diastolic dysfunction).  8./4 CT head without contrast;No acute intracranial abnormality. Prominent atrophy.  Culture 7/31 urine  cx: E Coli resistant to Ampicillin 7/31 blood cx x2 NGTD 7/31 MRSA by PCR negative  Antibiotics: Zosyn 7/31>>8/03 Vancomycin 7/31>>8/03 Rocephin 8/03 >>  DVT prophylaxis: SCD  Devices NA   Objective: VITAL SIGNS: Temp: 97.2 F (36.2 C) (08/06 0758) Temp src: Axillary (08/06 0758) BP: 142/94 mmHg (08/06 0758) Pulse Rate: 93 (08/06 0758) SPO2; 100% on 2 L via [place] FIO2:  Intake/Output Summary (Last 24 hours) at 05/20/14 1039 Last data filed at 05/20/14 0951  Gross per 24 hour  Intake 2961.25 ml  Output   1250 ml  Net 1711.25 ml   Exam: General: In no respiratory distress-awake with eyes open- grunts (baseline) Lungs: Clear to auscultation bilaterally without wheezes or crackles, RA Cardiovascular:  Regular rhythm and rate without murmur gallop or rub  Abdomen: Nontender, nondistended, soft, bowel sounds positive, no rebound, no ascites, no appreciable mass Extremities: No significant cyanosis, clubbing. 1+ edema  Data Reviewed: Basic Metabolic Panel:  Recent Labs Lab 05/16/14 1503  05/17/14 0611 05/17/14 1845 05/18/14 0342 05/18/14 1700 05/19/14 0449 05/20/14 0500  NA  --   < > 155* 155* 154* 151* 154* 147  K 2.5*  < > 3.8 3.6* 3.3* 3.6* 3.6* 3.2*  CL  --   < > 116* 120* 118* 117* 119* 111  CO2  --   < > 30 27 27 26 25 25   GLUCOSE  --   < > 109* 109* 129* 118* 136* 115*  BUN  --   < > 20 20 18 13 11 10   CREATININE  --   < > 1.08 1.13* 1.10 0.84 0.76 0.82  CALCIUM  --   < > 8.0* 7.4* 7.4* 7.7* 7.7* 7.9*  MG 2.0  --  1.8  --  1.8  --   --   --   < > = values in this interval not displayed.  Liver Function Tests:  Recent Labs Lab 05/14/14 1905 05/15/14 0600 05/15/14 0740 05/15/14 2119  AST 40* 32  --  36  ALT 13 12  --  18  ALKPHOS 108 70  --  92  BILITOT 0.3 0.3  --  1.2  PROT 6.9 5.6*  --  7.0  ALBUMIN 1.1* 1.8* 2.0* 2.2*   No results found for this basename: LIPASE, AMYLASE,  in the last 168 hours No results found for this basename: AMMONIA,   in the last 168 hours CBC:  Recent Labs Lab 05/14/14 1746  05/15/14 0740 05/15/14 2119 05/18/14 0345 05/19/14 0449 05/20/14 0159  WBC 27.2*  --  15.4* 19.3* 17.0* 14.3* 17.5*  NEUTROABS 20.9*  --  12.7* 17.3*  --  11.7*  --   HGB 9.3*  < > 5.2* 13.5 12.9 13.3 13.1  HCT 29.6*  < > 17.3* 39.8 38.2 40.3 39.3  MCV 97.7  --  99.4 90.0 89.5 92.6 89.5  PLT 159  --  70* 80* 42* 31* 31*  < > = values in this interval not displayed. Cardiac Enzymes:  Recent Labs Lab 05/15/14 0056 05/15/14 0600 05/15/14 2119  TROPONINI <0.30 <0.30 <0.30   BNP (last 3 results)  Recent Labs  10/28/13 1547  PROBNP 488.2*   CBG: No results found for this basename: GLUCAP,  in the last 168 hours  Recent Results (from  the past 240 hour(s))  URINE CULTURE     Status: None   Collection Time    05/14/14  5:19 PM      Result Value Ref Range Status   Specimen Description URINE, CATHETERIZED   Final   Special Requests NONE   Final   Culture  Setup Time     Final   Value: 05/14/2014 21:39     Performed at [facility]     Final   Value: >=100,000 COLONIES/ML     Performed at [facility]   Culture     Final   Value: ESCHERICHIA COLI     Performed at [facility]   Report Status 05/16/2014 FINAL   Final   Organism ID, Bacteria ESCHERICHIA COLI   Final  CULTURE, BLOOD (ROUTINE X 2)     Status: None   Collection Time    05/14/14  5:47 PM      Result Value Ref Range Status   Specimen Description BLOOD RIGHT ANTECUBITAL   Final   Special Requests BOTTLES DRAWN AEROBIC AND ANAEROBIC 2CC   Final   Culture  Setup Time     Final   Value: 05/14/2014 21:13     Performed at [facility]   Culture     Final   Value: NO GROWTH 5 DAYS     Performed at [facility]   Report Status 05/20/2014 FINAL   Final  CULTURE, BLOOD (ROUTINE X 2)     Status: None   Collection Time    05/14/14  5:47 PM      Result Value Ref Range Status   Specimen Description  BLOOD RIGHT ANTECUBITAL   Final   Special Requests BOTTLES DRAWN AEROBIC AND ANAEROBIC 2CC   Final   Culture  Setup Time     Final   Value: 05/14/2014 21:13     Performed at [facility]   Culture     Final   Value: NO GROWTH 5 DAYS     Performed at [facility]   Report Status 05/20/2014 FINAL   Final  MRSA PCR SCREENING     Status: None   Collection Time    05/14/14 10:29 PM      Result Value Ref Range Status   MRSA by PCR NEGATIVE  NEGATIVE Final   Comment:            The GeneXpert MRSA Assay (FDA     approved for NASAL specimens     only), is one component of a     comprehensive MRSA colonization     surveillance program. It is not     intended to diagnose MRSA     infection nor to guide or     monitor treatment for     MRSA infections.     Studies:  Recent x-ray studies have been reviewed in detail by the Attending Physician  Scheduled Meds:  Scheduled Meds: . antiseptic oral rinse  7 mL Mouth Rinse BID  . cefTRIAXone (ROCEPHIN)  IV  1 g Intravenous Q24H  . chlorhexidine  15 mL Mouth Rinse BID  . feeding supplement (JEVITY 1.2 CAL)  1,000 mL Per Tube Q24H  . free water  200 mL Per Tube 4 times per day  . levETIRAcetam  750 mg Intravenous 2 times per day  . levETIRAcetam  500 mg Intravenous Q24H  . levothyroxine  37.5 mcg Intravenous Daily  .  pantoprazole (PROTONIX) IV  40 mg Intravenous QHS  . valproate sodium  750 mg Intravenous 3 times per day    Time spent on care of this patient: 35 mins   ELLIS,ALLISON L. , ANP  [facility] Office  [phone] Pager - [phone]  On-Call/Text Page:      [email or web address]      password [email or web address]  If 7PM-7AM, please contact night-coverage [email or web address] Password [email or web address] 05/20/2014, 10:39 AM   LOS: 6 days   Examined patient with ANP Ebony Hail and agree with assessment and plan. Patient with multiple complex medical problems direct patient care> 40 minute

## 2014-05-20 NOTE — Clinical Social Work Psychosocial (Signed)
Clinical Social Work Department BRIEF PSYCHOSOCIAL ASSESSMENT 05/20/2014  Patient:  Dawn [name], Dawn [name]     Account Number:  [id number]     Admit date:  05/14/2014  Clinical Social Worker:  Dawn [name]  Date/Time:  05/20/2014 01:46 PM  Referred by:  Physician  Date Referred:  05/20/2014 Referred for  SNF Placement   Other Referral:   Interview type:  Family Other interview type:   SISTERBerneta [name] [phone]    PSYCHOSOCIAL DATA Living Status:  FACILITY Admitted from facility:  [facility] Level of care:  Assisted Living Primary support name:  Dawn [name] Primary support relationship to patient:  FAMILY Degree of support available:   GOOD    CURRENT CONCERNS Current Concerns  Post-Acute Placement   Other Concerns:    SOCIAL WORK ASSESSMENT / PLAN CSW spoke with sister who is the [place] as well as with Dawn [name] at [facility]. Pateint has resided at the group home for over 12 years and is "like family to [place]" the staff share.  They are hopeful for take her back at discharge but this will depend on how she progresses, if she requires PEG tube, etc-  Discussed with both sister/guardian as well as the group home staff the possible need for a higher level (SNF?) which they understand but are hopeful will not be necessary.   Assessment/plan status:  Other - See comment Other assessment/ plan:   FL2 completed- PASARR pending for SNF IF needed-   Information/referral to community resources:    PATIENT'S/FAMILY'S RESPONSE TO PLAN OF CARE: Both sister and group home staff are very supportive and understand the complexity of her needs at this time and the possibility of her needing SNF level of care at discharge. CSW reassured them we will keep them posted and involved in decsion making- both sister and group home staff appreciative of call and follow up.       Dawn [name], MSW, [place]

## 2014-05-20 NOTE — Progress Notes (Signed)
Speech Language Pathology Treatment: Dysphagia  Patient Details Name: Dawn [name] MRN: [id number] DOB: 1945-10-21 Today's Date: 05/20/2014 Time: 7782-4235 SLP Time Calculation (min): 14 min  Assessment / Plan / Recommendation Clinical Impression  Pt with no functional gains this session; pt is less responsive than yesterday to PO trials. With total assist for labial closure and transit of bolus, pt made no attempt to manipulate ice chip or sip water from straw, as seen yesterday. No swallow was elicited this session. SLP will continue efforts, but given slow progress, suggest conversation with family regarding comfort care vs alternative methods of nutrition beyond a few days.    HPI HPI: Pt is a 68 y.o. female w/ PMH of Seizures; Osteoporosis; Thyroid disease; High cholesterol; Cataracts, bilateral; Optic atrophy; Paraparesis; Hypothyroidism; Anginal pain; and Coronary artery disease. Baseline intellectual disability, wheelchair bound, nonverbal. stopped eating past few days and seemed unable to swallow and has been pocketing food. Has had similar episodes in the past and has recovered. Likely UTI. Suspected aspiration but CXR clear. Normally on mech soft diet/ thin but downgraded to NTL at facility. Treated for PNA 2 weeks ago. Bedside swallow eval ordered to assess swallow function given hx of possible dysphagia.    Pertinent Vitals    SLP Plan  Continue with current plan of care    Recommendations Diet recommendations: NPO Medication Administration: Via alternative means              Oral Care Recommendations: Oral care BID;Oral care Q4 per protocol Follow up Recommendations: Skilled Nursing facility Plan: Continue with current plan of care    GO    [facility], [place] CCC-SLP [phone]  [name] 05/20/2014, 1:41 PM

## 2014-05-20 NOTE — Progress Notes (Signed)
Patient's sister, Bertram Millard, called this AM for updates and to start communication with Pt's group home about change in feeding status and whether she will be eligible to remain in group home. Enquiry passed to PA and Case Manager.

## 2014-05-21 ENCOUNTER — Inpatient Hospital Stay (HOSPITAL_COMMUNITY): Payer: Medicare Other

## 2014-05-21 MED ORDER — DOCUSATE SODIUM 50 MG/5ML PO LIQD
100.0000 mg | Freq: Two times a day (BID) | ORAL | Status: DC
  Administered 2014-05-21 – 2014-05-27 (×14): 100 mg
  Filled 2014-05-21 (×16): qty 10

## 2014-05-21 MED ORDER — POTASSIUM CHLORIDE 20 MEQ/15ML (10%) PO LIQD
40.0000 meq | Freq: Two times a day (BID) | ORAL | Status: AC
  Administered 2014-05-21 (×2): 40 meq via ORAL
  Filled 2014-05-21 (×2): qty 30

## 2014-05-21 MED ORDER — BISACODYL 5 MG PO TBEC
10.0000 mg | DELAYED_RELEASE_TABLET | Freq: Once | ORAL | Status: AC
  Administered 2014-05-21: 10 mg via ORAL
  Filled 2014-05-21: qty 2

## 2014-05-21 MED ORDER — MAGNESIUM HYDROXIDE 400 MG/5ML PO SUSP
15.0000 mL | Freq: Once | ORAL | Status: AC
  Administered 2014-05-21: 15 mL
  Filled 2014-05-21: qty 30

## 2014-05-21 NOTE — Progress Notes (Signed)
Rt gave pt neb tx. Pt has wheezing and some wob. Rn aware, x-ray pending.

## 2014-05-21 NOTE — Progress Notes (Signed)
Speech Language Pathology Treatment: Dysphagia  Patient Details Name: Dawn [name] MRN: [id number] DOB: Nov 21, 1945 Today's Date: 05/21/2014 Time: 1205-1220 SLP Time Calculation (min): 15 min  Assessment / Plan / Recommendation Clinical Impression  Patient presents with severe cognitive-based oral dysphagia. Patient opened mouth and readily accepted puree solids and pudding thick liquid boluses into oral cavity, however did not attempt to elicit swallow, and no further oral manipulation of bolus. SLP removed boluses from oral cavity after attempts to use dry spoon and tactile cues to elicit swallow.    HPI HPI: Pt is a 68 y.o. female w/ PMH of Seizures; Osteoporosis; Thyroid disease; High cholesterol; Cataracts, bilateral; Optic atrophy; Paraparesis; Hypothyroidism; Anginal pain; and Coronary artery disease. Baseline intellectual disability, wheelchair bound, nonverbal. stopped eating past few days and seemed unable to swallow and has been pocketing food. Has had similar episodes in the past and has recovered. Likely UTI. Suspected aspiration but CXR clear. Normally on mech soft diet/ thin but downgraded to NTL at facility. Treated for PNA 2 weeks ago. Bedside swallow eval ordered to assess swallow function given hx of possible dysphagia.    Pertinent Vitals Pain Assessment: No/denies pain  SLP Plan  Continue with current plan of care    Recommendations Diet recommendations: NPO Medication Administration: Via alternative means              Oral Care Recommendations: Oral care Q4 per protocol Follow up Recommendations: Skilled Nursing facility Plan: Continue with current plan of care    GO     Dannial Monarch 05/21/2014, 2:54 PM  Sonia Baller, MA, CCC-SLP [facility] Speech-Language Pathologist

## 2014-05-21 NOTE — Progress Notes (Signed)
Notified RT Lattie Haw about audible exp wheeze.

## 2014-05-21 NOTE — Progress Notes (Signed)
[place] TEAM 1 - Stepdown/ICU TEAM Progress Note  Dawn QUEBEDEAUX OFB:[id number] DOB: 05/09/1946 DOA: 05/14/2014 PCP: [name]  Admit HPI / Brief Narrative: 68 y.o. PMH of Seizures; diastolic CHF, atrial fibrillation, pituitary microadenoma (diagnosed 2009), hypothyroidism; High cholesterol and CAD. She also has developmental disability and at baseline non verbal and wheelchair bound status  She presented after staff at the group home noticed she had stopped eating for the past few days and seemed to be unable to swallow. No apparent fever or chills. She had been pocketing food in her mouth.  Had dental visit the previous week. Reportedly she had similar episodes of these symptoms in the past and recovered.   Upon arrival to emerge department patient was noted to be hypotensive (SBP down to 70s). She was given IV fluids and after 2 L blood pressure improved to her baseline of 110s. Sister was at the bedside so the admitting MD discussed goals of care. Family wished for her to be limited code. Patient appeared to have urinary tract infection leading to sepsis. Zosyn and vancomycin had been started by the EDP. There was also concern over possible aspiration although chest x-ray was unremarkable. She had been on mechanical soft diet for years with thin fluids but for several days prior to presentation she was switched to nectar thick due to concerns of active aspiration.dysphagia progression. For the 24 hours prior to presenatation she had stopped eating and drinking altogether. Per group home 2 weeks ago patient was treated for mild pneumonia.   HPI/Subjective: Awake and nonverbal - baseline    Assessment/Plan:  Sepsis/septic shock due to E coli UTI -Source E. Coli urinary tract infection - continue anbx -Lactic Acid decreased from 2.8 to 1.6 -Blood cx NGTD  Right facial droop -possible low perfusion CVA in setting of recent hypotension -non contrasted CT Head normal 8/4 -suspect  this is her baseline facies   Cerebral palsy / Acute metabolic encephalopathy / dysphagia -has chronic dysphagia  -SLP has evaluated for several days and continue to recommend alternate route for diet/meds -placed feeding tube 8/5 and began feeds -has started wheezing so check PCXR to ensure no aspiration and proper placement if feeding tube -no BM since admit so begin laxatives  Hypernatremia with dehydration -Na has normalized   Hypokalemia -replete per tube  Symptomatic anemia -8/1 Transfused 3 units PRBC; anemia resolved and now hgb >12  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 [phone] Goals of care reviewed by MD on 8/2 at which time code status was changed to DNR - 8/6 Spoke at length with [name]- discussed need for temporary feeding tube and concerns over possible need for PEG-made aware that PEG may only prolong her life in setting of progressive dementia Disposition Plan: SDU  Consultants: NA  Procedure: 1/15 echocardiogram - Left ventricle: mild focal basal hypertrophy of the septum. -LVEF= 60% to 65%.  -(grade 2 diastolic dysfunction).  8/4 CT head without contrast;No acute intracranial abnormality. Prominent atrophy.  Antibiotics: Zosyn 7/31>>8/03 Vancomycin 7/31>>8/03 Rocephin 8/03 >>  DVT prophylaxis: SCD   Objective: VITAL SIGNS: Temp: 98.3 F (36.8 C) (08/07  1153) Temp src: Axillary (08/07 1153) BP: 136/83 mmHg (08/07 1153) Pulse Rate: 113 (08/07 1153) SPO2; 100% on 2 L via [place] FIO2:  Intake/Output  Summary (Last 24 hours) at 05/21/14 1256 Last data filed at 05/21/14 1201  Gross per 24 hour  Intake 2811.08 ml  Output   1325 ml  Net 1486.08 ml   Exam: General: In no respiratory distress - awake with eyes open Lungs: Clear to auscultation bilaterally - later developed wheezes, RA Cardiovascular:  Regular rhythm and rate without murmur gallop or rub  Abdomen: Nontender, nondistended, soft, bowel sounds positive, no rebound, no ascites, no appreciable mass Extremities: No significant cyanosis, clubbing - trace edema  Data Reviewed:  Basic Metabolic Panel:  Recent Labs Lab 05/16/14 1503  05/17/14 0611 05/17/14 1845 05/18/14 0342 05/18/14 1700 05/19/14 0449 05/20/14 0500  NA  --   < > 155* 155* 154* 151* 154* 147  K 2.5*  < > 3.8 3.6* 3.3* 3.6* 3.6* 3.2*  CL  --   < > 116* 120* 118* 117* 119* 111  CO2  --   < > 30 27 27 26 25 25   GLUCOSE  --   < > 109* 109* 129* 118* 136* 115*  BUN  --   < > 20 20 18 13 11 10   CREATININE  --   < > 1.08 1.13* 1.10 0.84 0.76 0.82  CALCIUM  --   < > 8.0* 7.4* 7.4* 7.7* 7.7* 7.9*  MG 2.0  --  1.8  --  1.8  --   --   --   < > = values in this interval not displayed.  Liver Function Tests:  Recent Labs Lab 05/14/14 1905 05/15/14 0600 05/15/14 0740 05/15/14 2119  AST 40* 32  --  36  ALT 13 12  --  18  ALKPHOS 108 70  --  92  BILITOT 0.3 0.3  --  1.2  PROT 6.9 5.6*  --  7.0  ALBUMIN 1.1* 1.8* 2.0* 2.2*   No results found for this basename: LIPASE, AMYLASE,  in the last 168 hours No results found for this basename: AMMONIA,  in the last 168 hours CBC:  Recent Labs Lab 05/14/14 1746  05/15/14 0740 05/15/14 2119 05/18/14 0345 05/19/14 0449 05/20/14 0159  WBC 27.2*  --  15.4* 19.3* 17.0* 14.3* 17.5*  NEUTROABS 20.9*  --  12.7* 17.3*  --  11.7*  --   HGB 9.3*  < > 5.2* 13.5 12.9 13.3 13.1  HCT 29.6*  < > 17.3* 39.8 38.2 40.3 39.3  MCV 97.7  --  99.4 90.0 89.5 92.6 89.5  PLT 159  --  70* 80* 42* 31* 31*  < > = values in this  interval not displayed. Cardiac Enzymes:  Recent Labs Lab 05/15/14 0056 05/15/14 0600 05/15/14 2119  TROPONINI <0.30 <0.30 <0.30   BNP (last 3 results)  Recent Labs  10/28/13 1547  PROBNP 488.2*   CBG: No results found for this basename: GLUCAP,  in the last 168 hours  Recent Results (from the past 240 hour(s))  URINE CULTURE     Status: None   Collection Time    05/14/14  5:19 PM      Result Value Ref Range Status   Specimen Description URINE, CATHETERIZED   Final   Special Requests NONE   Final   Culture  Setup Time     Final   Value: 05/14/2014 21:39     Performed at [facility]  Colony Count     Final   Value: >=100,000 COLONIES/ML     Performed at [facility]   Culture     Final   Value: ESCHERICHIA COLI     Performed at [facility]   Report Status 05/16/2014 FINAL   Final   Organism ID, Bacteria ESCHERICHIA COLI   Final  CULTURE, BLOOD (ROUTINE X 2)     Status: None   Collection Time    05/14/14  5:47 PM      Result Value Ref Range Status   Specimen Description BLOOD RIGHT ANTECUBITAL   Final   Special Requests BOTTLES DRAWN AEROBIC AND ANAEROBIC 2CC   Final   Culture  Setup Time     Final   Value: 05/14/2014 21:13     Performed at [facility]   Culture     Final   Value: NO GROWTH 5 DAYS     Performed at [facility]   Report Status 05/20/2014 FINAL   Final  CULTURE, BLOOD (ROUTINE X 2)     Status: None   Collection Time    05/14/14  5:47 PM      Result Value Ref Range Status   Specimen Description BLOOD RIGHT ANTECUBITAL   Final   Special Requests BOTTLES DRAWN AEROBIC AND ANAEROBIC 2CC   Final   Culture  Setup Time     Final   Value: 05/14/2014 21:13     Performed at [facility]   Culture     Final   Value: NO GROWTH 5 DAYS     Performed at [facility]   Report Status 05/20/2014 FINAL   Final  MRSA PCR SCREENING     Status: None   Collection Time    05/14/14 10:29 PM      Result  Value Ref Range Status   MRSA by PCR NEGATIVE  NEGATIVE Final   Comment:            The GeneXpert MRSA Assay (FDA     approved for NASAL specimens     only), is one component of a     comprehensive MRSA colonization     surveillance program. It is not     intended to diagnose MRSA     infection nor to guide or     monitor treatment for     MRSA infections.     Studies:  Recent x-ray studies have been reviewed in detail by the Attending Physician  Scheduled Meds:  Scheduled Meds: . antiseptic oral rinse  7 mL Mouth Rinse BID  . cefTRIAXone (ROCEPHIN)  IV  1 g Intravenous Q24H  . chlorhexidine  15 mL Mouth Rinse BID  . docusate  100 mg Per Tube BID  . feeding supplement (JEVITY 1.2 CAL)  1,000 mL Per Tube Q24H  . free water  200 mL Per Tube 4 times per day  . levETIRAcetam  750 mg Intravenous 2 times per day  . levETIRAcetam  500 mg Intravenous Q24H  . levothyroxine  37.5 mcg Intravenous Daily  . magnesium hydroxide  15 mL Per Tube Once  . pantoprazole (PROTONIX) IV  40 mg Intravenous QHS  . valproate sodium  750 mg Intravenous 3 times per day    Time spent on care of this patient: 25 mins   ELLIS,ALLISON L. , ANP  [facility] Office  [phone] Pager - [phone]  On-Call/Text Page:      [email or web address]  password [email or web address]  If 7PM-7AM, please contact night-coverage [email or web address] Password [email or web address] 05/21/2014, 12:56 PM   LOS: 7 days   I have personally examined this patient and reviewed the entire database. I have reviewed the above note, made any necessary editorial changes, and agree with its content.  Cherene Altes, MD [facility]

## 2014-05-21 NOTE — Progress Notes (Signed)
Speech Language Pathology Treatment: Dysphagia  Patient Details Name: Dawn [name] MRN: [id number] DOB: Feb 20, 1946 Today's Date: 05/21/2014 Time: 6203-5597 SLP Time Calculation (min): 21 min  Assessment / Plan / Recommendation Clinical Impression  Pt continues with no functional gains this session.  Pt required total cues/verbal/visual/tactile to awaken to participate in therapy.  Open lower labia posture noted at baseline. She did open mouth to accept po intake of ice and tsp apple juice with adequate reflexive labial closure but she did not transit boluses - requiring SlP to orally suction to remove. No swallow was elicited this session and pt with minimal amount of wheeze and increased RR rate.  Given pt needed modification to diet prior to admission and was previously tx for asp pna, suspect poor prognosis for functional swallow function during this acute stay.  Agree with previous SLP for indication of discussion re: comfort care vs alternative methods of nutrition beyond a few days.   Aspiration will not be prevented with feeding tube but would provide nutrition for pt.  Will follow up for family education, continued therapy efforts - will down grade to 2 times a week due to lack of current progress.  Please call if pt demonstrates improved mentation and indication of possible ability to consume po present.  Thanks.     HPI HPI: Pt is a 68 y.o. female w/ PMH of Seizures; Osteoporosis; Thyroid disease; High cholesterol; Cataracts, bilateral; Optic atrophy; Paraparesis; Hypothyroidism; Anginal pain; and Coronary artery disease. Baseline intellectual disability, wheelchair bound, nonverbal. stopped eating past few days and seemed unable to swallow and has been pocketing food. Has had similar episodes in the past and has recovered. Likely UTI. Suspected aspiration but CXR clear. Normally on mech soft diet/ thin but downgraded to NTL at facility. Treated for PNA 2 weeks ago. Bedside swallow eval  ordered to assess swallow function given hx of possible dysphagia.    Pertinent Vitals Pain Assessment: No/denies pain (no s/s of pain)  SLP Plan  Continue with current plan of care    Recommendations Diet recommendations: NPO Medication Administration: Via alternative means              Oral Care Recommendations: Oral care Q4 per protocol Follow up Recommendations: Skilled Nursing facility Plan: Continue with current plan of care    [place], [place] [facility]  SLP [phone]

## 2014-05-22 LAB — BASIC METABOLIC PANEL
Anion gap: 8 (ref 5–15)
BUN: 12 mg/dL (ref 6–23)
CO2: 28 mEq/L (ref 19–32)
Calcium: 7.8 mg/dL — ABNORMAL LOW (ref 8.4–10.5)
Chloride: 112 mEq/L (ref 96–112)
Creatinine, Ser: 0.77 mg/dL (ref 0.50–1.10)
GFR calc Af Amer: >90 mL/min (ref >90)
GFR calc non Af Amer: 85 mL/min — ABNORMAL LOW (ref >90)
GLUCOSE: 139 mg/dL — AB (ref 70–99)
POTASSIUM: 4.9 meq/L (ref 3.7–5.3)
Sodium: 148 mEq/L — ABNORMAL HIGH (ref 137–147)

## 2014-05-22 LAB — CBC
HCT: 34.8 % — ABNORMAL LOW (ref 36.0–46.0)
HEMOGLOBIN: 11.6 g/dL — AB (ref 12.0–15.0)
MCH: 30.3 pg (ref 26.0–34.0)
MCHC: 33.3 g/dL (ref 30.0–36.0)
MCV: 90.9 fL (ref 78.0–100.0)
PLATELETS: 41 10*3/uL — AB (ref 150–400)
RBC: 3.83 MIL/uL — AB (ref 3.87–5.11)
RDW: 17.1 % — ABNORMAL HIGH (ref 11.5–15.5)
WBC: 12.1 10*3/uL — ABNORMAL HIGH (ref 4.0–10.5)

## 2014-05-22 LAB — VALPROIC ACID LEVEL: Valproic Acid Lvl: 75.1 ug/mL (ref 50.0–100.0)

## 2014-05-22 MED ORDER — LEVOTHYROXINE SODIUM 75 MCG PO TABS
75.0000 ug | ORAL_TABLET | Freq: Every day | ORAL | Status: DC
  Administered 2014-05-23 – 2014-05-27 (×5): 75 ug
  Filled 2014-05-22 (×8): qty 1

## 2014-05-22 MED ORDER — VALPROIC ACID 250 MG/5ML PO SYRP
750.0000 mg | ORAL_SOLUTION | Freq: Three times a day (TID) | ORAL | Status: DC
  Administered 2014-05-22 – 2014-05-31 (×25): 750 mg
  Administered 2014-05-31: 250 mg
  Administered 2014-05-31 – 2014-06-09 (×27): 750 mg
  Filled 2014-05-22 (×60): qty 15

## 2014-05-22 MED ORDER — LEVETIRACETAM 100 MG/ML PO SOLN
750.0000 mg | Freq: Two times a day (BID) | ORAL | Status: DC
  Administered 2014-05-22 – 2014-06-09 (×36): 750 mg
  Filled 2014-05-22 (×37): qty 7.5

## 2014-05-22 MED ORDER — LEVETIRACETAM 100 MG/ML PO SOLN
500.0000 mg | Freq: Every day | ORAL | Status: DC
  Administered 2014-05-23 – 2014-05-27 (×5): 500 mg
  Filled 2014-05-22 (×9): qty 5

## 2014-05-22 NOTE — Progress Notes (Signed)
[place] TEAM 1 - Stepdown/ICU TEAM Progress Note  Dawn [name] KYH:[id number] DOB: [birth year] DOA: 05/14/2014 PCP: [name]  Admit HPI / Brief Narrative: 68 y.o. PMH of Seizures; diastolic CHF, atrial fibrillation, pituitary microadenoma (diagnosed 2009), hypothyroidism; High cholesterol and CAD. She also has developmental disability and at baseline non verbal and wheelchair bound status  She presented after staff at the group home noticed she had stopped eating for the past few days and seemed to be unable to swallow. No apparent fever or chills. She had been pocketing food in her mouth.  Had dental visit the previous week. Reportedly she had similar episodes of these symptoms in the past and recovered.   Upon arrival to emerge department patient was noted to be hypotensive (SBP down to 70s). She was given IV fluids and after 2 L blood pressure improved to her baseline of 110s. Sister was at the bedside so the admitting MD discussed goals of care. Family wished for her to be limited code. Patient appeared to have urinary tract infection leading to sepsis. Zosyn and vancomycin had been started by the EDP. There was also concern over possible aspiration although chest x-ray was unremarkable. She had been on mechanical soft diet for years with thin fluids but for several days prior to presentation she was switched to nectar thick due to concerns of active aspiration.dysphagia progression. For the 24 hours prior to presenatation she had stopped eating and drinking altogether. Per group home 2 weeks ago patient was treated for mild pneumonia.   HPI/Subjective: Awake.  Non communicative as per baseline.      Assessment/Plan:  Sepsis / septic shock due to pan sensitive E coli UTI -continued anbx to complete a 7 day course - blood cx negative x 5 days - sepsis physiology has resolved   Right facial droop -non contrasted CT Head normal 8/4 - suspect this is her baseline facies    Cerebral palsy / Acute metabolic encephalopathy / dysphagia -has chronic dysphagia  -SLP has evaluated for several days and continue to recommend alternate route for diet/meds -placed feeding tube 8/5 and began feeds  Hypernatremia with dehydration -Na improved but fluctuating - follow trend   Hypokalemia -repleted per tube  Symptomatic anemia -8/1 transfused 3 units PRBC - anemia resolved - Hgb stable   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 [phone] Goals of care reviewed by MD on 8/2 at which time code status was changed to DNR - 8/6 Spoke at length with [name]- discussed need for temporary feeding tube and concerns over possible need for PEG - made aware that PEG may only prolong her life in setting of progressive dementia Disposition Plan: SDU due to inability to communicate needs to staff/call for help and high risk of aspiration if NG removed/dislodged   Consultants: NA  Procedures:  8/4 CT head without contrast - No acute intracranial abnormality. Prominent atrophy.  Antibiotics: Zosyn 7/31>>8/03 Vancomycin 7/31>>8/03 Rocephin 8/03 >>8/07  DVT prophylaxis: SCD   Objective: VITAL SIGNS: Temp: 99.5 F (37.5 C) (08/08 1300) Temp src: Axillary (08/08 1300) BP: 104/50 mmHg (08/08 1300) Pulse Rate: 97 (08/08 1300)  SPO2; 100% on 2 L via [place] FIO2:  Intake/Output Summary (Last 24 hours) at 05/22/14 1546 Last data filed at 05/22/14 1500  Gross per 24 hour  Intake 2942.83 ml  Output   3050 ml  Net -107.17 ml   Exam: General: no respiratory distress - awake  with eyes open Lungs: Clear to auscultation bilaterally w/o wheeze  Cardiovascular:  Regular rhythm without murmur gallop or rub  Abdomen: Nontender, nondistended, soft, bowel sounds positive, no rebound, no ascites, no appreciable mass Extremities: No significant cyanosis, clubbing - trace edema  Data Reviewed:  Basic Metabolic Panel:  Recent Labs Lab 05/16/14 1503  05/17/14 0611  05/18/14 0342 05/18/14 1700 05/19/14 0449 05/20/14 0500 05/22/14 0420  NA  --   < > 155*  < > 154* 151* 154* 147 148*  K 2.5*  < > 3.8  < > 3.3* 3.6* 3.6* 3.2* 4.9  CL  --   < > 116*  < > 118* 117* 119* 111 112  CO2  --   < > 30  < > 27 26 25 25 28   GLUCOSE  --   < > 109*  < > 129* 118* 136* 115* 139*  BUN  --   < > 20  < > 18 13 11 10 12   CREATININE  --   < > 1.08  < > 1.10 0.84 0.76 0.82 0.77  CALCIUM  --   < > 8.0*  < > 7.4* 7.7* 7.7* 7.9* 7.8*  MG 2.0  --  1.8  --  1.8  --   --   --   --   < > = values in this interval not displayed.  Liver Function Tests:  Recent Labs Lab 05/15/14 2119  AST 36  ALT 18  ALKPHOS 92  BILITOT 1.2  PROT 7.0  ALBUMIN 2.2*   CBC:  Recent Labs Lab 05/15/14 2119 05/18/14 0345 05/19/14 0449 05/20/14 0159 05/22/14 0420  WBC 19.3* 17.0* 14.3* 17.5* 12.1*  NEUTROABS 17.3*  --  11.7*  --   --   HGB 13.5 12.9 13.3 13.1 11.6*  HCT 39.8 38.2 40.3 39.3 34.8*  MCV 90.0 89.5 92.6 89.5 90.9  PLT 80* 42* 31* 31* 41*   Cardiac Enzymes:  Recent Labs Lab 05/15/14 2119  TROPONINI <0.30   BNP (last 3 results)  Recent Labs  10/28/13 1547  PROBNP 488.2*    Recent Results (from the past 240 hour(s))  URINE CULTURE     Status: None   Collection Time    05/14/14  5:19 PM      Result Value Ref Range Status   Specimen Description URINE, CATHETERIZED   Final   Special Requests NONE   Final   Culture  Setup Time     Final   Value: 05/14/2014 21:39     Performed at [place]     Final   Value: >=100,000 COLONIES/ML      Performed at [facility]   Culture     Final   Value: ESCHERICHIA COLI     Performed at [facility]   Report Status 05/16/2014 FINAL   Final   Organism ID, Bacteria ESCHERICHIA COLI   Final  CULTURE, BLOOD (ROUTINE X 2)     Status: None   Collection Time    05/14/14  5:47 PM      Result Value Ref Range Status   Specimen Description BLOOD RIGHT ANTECUBITAL   Final  Special Requests BOTTLES DRAWN AEROBIC AND ANAEROBIC 2CC   Final   Culture  Setup Time     Final   Value: 05/14/2014 21:13     Performed at [facility]   Culture     Final   Value: NO GROWTH 5 DAYS     Performed at [facility]   Report Status 05/20/2014 FINAL   Final  CULTURE, BLOOD (ROUTINE X 2)     Status: None   Collection Time    05/14/14  5:47 PM      Result Value Ref Range Status   Specimen Description BLOOD RIGHT ANTECUBITAL   Final   Special Requests BOTTLES DRAWN AEROBIC AND ANAEROBIC 2CC   Final   Culture  Setup Time     Final   Value: 05/14/2014 21:13     Performed at [facility]   Culture     Final   Value: NO GROWTH 5 DAYS     Performed at [facility]   Report Status 05/20/2014 FINAL   Final  MRSA PCR SCREENING     Status: None   Collection Time    05/14/14 10:29 PM      Result Value Ref Range Status   MRSA by PCR NEGATIVE  NEGATIVE Final   Comment:            The GeneXpert MRSA Assay (FDA     approved for NASAL specimens     only), is one component of a     comprehensive MRSA colonization     surveillance program. It is not     intended to diagnose MRSA     infection nor to guide or     monitor treatment for     MRSA infections.     Studies:  Recent x-ray studies have been reviewed in detail by the Attending Physician  Scheduled Meds:  Scheduled Meds: . antiseptic oral rinse  7 mL Mouth Rinse BID  . cefTRIAXone (ROCEPHIN)  IV  1 g Intravenous Q24H  . chlorhexidine  15 mL Mouth Rinse BID  . docusate  100 mg Per Tube BID  . feeding  supplement (JEVITY 1.2 CAL)  1,000 mL Per Tube Q24H  . free water  200 mL Per Tube 4 times per day  . levETIRAcetam  750 mg Intravenous 2 times per day  . levETIRAcetam  500 mg Intravenous Q24H  . levothyroxine  37.5 mcg Intravenous Daily  . pantoprazole (PROTONIX) IV  40 mg Intravenous QHS  . valproate sodium  750 mg Intravenous 3 times per day    Time spent on care of this patient: 25 mins  Cherene Altes, MD [facility] For Consults/Admissions - Flow Manager - [phone] Office  [phone] Pager [phone]  On-Call/Text Page:      [email or web address]      password [facility]  05/22/2014, 3:46 PM   LOS: 8 days

## 2014-05-23 LAB — HEPATIC FUNCTION PANEL
ALBUMIN: 1.3 g/dL — AB (ref 3.5–5.2)
ALT: 11 U/L (ref 0–35)
AST: 19 U/L (ref 0–37)
Alkaline Phosphatase: 148 U/L — ABNORMAL HIGH (ref 39–117)
Bilirubin, Direct: <0.2 mg/dL (ref 0.0–0.3)
TOTAL PROTEIN: 6.1 g/dL (ref 6.0–8.3)
Total Bilirubin: 0.2 mg/dL — ABNORMAL LOW (ref 0.3–1.2)

## 2014-05-23 LAB — BASIC METABOLIC PANEL
ANION GAP: 8 (ref 5–15)
BUN: 12 mg/dL (ref 6–23)
CALCIUM: 7.8 mg/dL — AB (ref 8.4–10.5)
CO2: 29 mEq/L (ref 19–32)
Chloride: 108 mEq/L (ref 96–112)
Creatinine, Ser: 0.63 mg/dL (ref 0.50–1.10)
GFR calc non Af Amer: >90 mL/min (ref >90)
GLUCOSE: 147 mg/dL — AB (ref 70–99)
POTASSIUM: 4.5 meq/L (ref 3.7–5.3)
SODIUM: 145 meq/L (ref 137–147)

## 2014-05-23 NOTE — Progress Notes (Signed)
[facility] TEAM 1 - Stepdown/ICU TEAM Progress Note  Dawn [name] XBD:[id number] DOB: 09-12-1946 DOA: 05/14/2014 PCP: [name]  Admit HPI / Brief Narrative: 68 y.o. PMH of Seizures; diastolic CHF, atrial fibrillation, pituitary microadenoma (diagnosed 2009), hypothyroidism; High cholesterol and CAD. She also has developmental disability and at baseline non verbal and wheelchair bound status  She presented after staff at the group home noticed she had stopped eating for the past few days and seemed to be unable to swallow. No apparent fever or chills. She had been pocketing food in her mouth.  Had dental visit the previous week. Reportedly she had similar episodes of these symptoms in the past and recovered.   Upon arrival to emerge department patient was noted to be hypotensive (SBP down to 70s). She was given IV fluids and after 2 L blood pressure improved to her baseline of 110s. Sister was at the bedside so the admitting MD discussed goals of care. Family wished for her to be limited code. Patient appeared to have urinary tract infection leading to sepsis. Zosyn and vancomycin had been started by the EDP. There was also concern over possible aspiration although chest x-ray was unremarkable. She had been on mechanical soft diet for years with thin fluids but for several days prior to presentation she was switched to nectar thick due to concerns of active aspiration.dysphagia progression. For the 24 hours prior to presenatation she had stopped eating and drinking altogether. Per group home 2 weeks ago patient was treated for mild pneumonia.   HPI/Subjective: No new issues noted today.  Non communicative as per baseline.      Assessment/Plan:  Sepsis / septic shock due to pan sensitive E coli UTI -completed a 7 day abx course - blood cx negative x 5 days - sepsis physiology has resolved   Right facial droop -non contrasted CT Head normal 8/4 - suspect this is her baseline facies    Cerebral palsy / Acute metabolic encephalopathy / dysphagia -has chronic dysphagia  -SLP has evaluated for several days and continue to recommend alternate route for diet/meds -placed feeding tube 8/5 and began feeds -doubtful she will improve to point of oral intake - will need to address long term goals in regard to PEG w/ family this week   Hypernatremia with dehydration -Na improved / normalized   Hypokalemia -repleted per tube and normal   Symptomatic anemia -8/1 transfused 3 units PRBC - anemia resolved - Hgb stable   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 [phone] Goals of care reviewed by MD on 8/2 at which time code status was changed to DNR - 8/6 Spoke at length with [name]- discussed need for temporary feeding tube and concerns over possible need for PEG - made aware that PEG may only prolong her life in setting of progressive dementia Disposition Plan: SDU due to inability to communicate needs to staff/call for help and high risk of aspiration if NG removed/dislodged   Consultants: NA  Procedures:  8/4 CT head without contrast - No acute intracranial abnormality. Prominent atrophy.  Antibiotics: Zosyn 7/31>>8/03 Vancomycin 7/31>>8/03 Rocephin 8/03 >>8/07  DVT prophylaxis: SCD  Objective: VITAL SIGNS: Temp: 99 F (37.2 C) (08/09 1154) Temp src: Oral (08/09 1154) BP: 115/55 mmHg (08/09 1154) Pulse Rate: 95 (08/09 1154) SPO2; 100% on 2 L via [place] FIO2:  Intake/Output Summary (Last 24 hours) at 05/23/14 1239 Last data filed at 05/23/14 1157  Gross per 24 hour   Intake 1003.91 ml  Output   2700 ml  Net -1696.09 ml   Exam: General: no respiratory distress - awake with eyes open Lungs: Clear to auscultation bilaterally Cardiovascular:  Regular rhythm without murmur gallop or rub  Abdomen: Nontender, nondistended, soft, bowel sounds positive, no rebound, no ascites, no appreciable mass Extremities: No significant cyanosis, clubbing - trace edema  Data Reviewed:  Basic Metabolic Panel:  Recent Labs Lab 05/16/14 1503  05/17/14 0611  05/18/14 0342 05/18/14 1700 05/19/14 0449 05/20/14 0500 05/22/14 0420 05/23/14 0300  NA  --   < > 155*  < > 154* 151* 154* 147 148* 145  K 2.5*  < > 3.8  < > 3.3* 3.6* 3.6* 3.2* 4.9 4.5  CL  --   < > 116*  < > 118* 117* 119* 111 112 108  CO2  --   < > 30  < > 27 26 25 25 28 29   GLUCOSE  --   < > 109*  < > 129* 118* 136* 115* 139* 147*  BUN  --   < > 20  < > 18 13 11 10 12 12   CREATININE  --   < > 1.08  < > 1.10 0.84 0.76 0.82 0.77 0.63  CALCIUM  --   < > 8.0*  < > 7.4* 7.7* 7.7* 7.9* 7.8* 7.8*  MG 2.0  --  1.8  --  1.8  --   --   --   --   --   < > = values in this interval not displayed.  Liver Function Tests:  Recent Labs Lab 05/23/14 0300  AST 19  ALT 11  ALKPHOS 148*  BILITOT 0.2*  PROT 6.1  ALBUMIN 1.3*   CBC:  Recent Labs Lab 05/18/14 0345 05/19/14 0449 05/20/14 0159 05/22/14 0420  WBC 17.0* 14.3* 17.5* 12.1*  NEUTROABS  --  11.7*  --   --   HGB 12.9 13.3 13.1 11.6*  HCT 38.2 40.3 39.3 34.8*  MCV 89.5 92.6 89.5 90.9  PLT 42* 31* 31* 41*    Recent Results (from the past 240 hour(s))  URINE CULTURE     Status: None   Collection Time    05/14/14  5:19 PM      Result Value Ref Range Status   Specimen Description URINE, CATHETERIZED   Final   Special Requests NONE   Final   Culture  Setup Time     Final   Value: 05/14/2014 21:39     Performed at [facility]     Final   Value: >=100,000 COLONIES/ML     Performed at [facility]   Culture      Final   Value: ESCHERICHIA COLI     Performed at [facility]   Report Status 05/16/2014 FINAL   Final   Organism ID, Bacteria ESCHERICHIA COLI   Final  CULTURE, BLOOD (ROUTINE X 2)     Status: None   Collection Time    05/14/14  5:47 PM      Result Value Ref Range Status   Specimen Description BLOOD RIGHT ANTECUBITAL   Final  Special Requests BOTTLES DRAWN AEROBIC AND ANAEROBIC 2CC   Final   Culture  Setup Time     Final   Value: 05/14/2014 21:13     Performed at [facility]   Culture     Final   Value: NO GROWTH 5 DAYS     Performed at [facility]   Report Status 05/20/2014 FINAL   Final  CULTURE, BLOOD (ROUTINE X 2)     Status: None   Collection Time    05/14/14  5:47 PM      Result Value Ref Range Status   Specimen Description BLOOD RIGHT ANTECUBITAL   Final   Special Requests BOTTLES DRAWN AEROBIC AND ANAEROBIC 2CC   Final   Culture  Setup Time     Final   Value: 05/14/2014 21:13     Performed at [facility]   Culture     Final   Value: NO GROWTH 5 DAYS     Performed at [facility]   Report Status 05/20/2014 FINAL   Final  MRSA PCR SCREENING     Status: None   Collection Time    05/14/14 10:29 PM      Result Value Ref Range Status   MRSA by PCR NEGATIVE  NEGATIVE Final   Comment:            The GeneXpert MRSA Assay (FDA     approved for NASAL specimens     only), is one component of a     comprehensive MRSA colonization     surveillance program. It is not     intended to diagnose MRSA     infection nor to guide or     monitor treatment for     MRSA infections.     Studies:  Recent x-ray studies have been reviewed in detail by the Attending Physician  Scheduled Meds:  Scheduled Meds: . antiseptic oral rinse  7 mL Mouth Rinse BID  . chlorhexidine  15 mL Mouth Rinse BID  . docusate  100 mg Per Tube BID  . feeding supplement (JEVITY 1.2 CAL)  1,000 mL Per Tube Q24H  . free water  200 mL Per Tube 4 times per day    . levETIRAcetam  500 mg Per Tube Q0600  . levETIRAcetam  750 mg Per Tube BID  . levothyroxine  75 mcg Per Tube QAC breakfast  . Valproic Acid  750 mg Per Tube TID    Time spent on care of this patient: 25 mins  Cherene Altes, MD [facility] For Consults/Admissions - Flow Manager - [phone] Office  [phone] Pager [phone]  On-Call/Text Page:      [email or web address]      password [facility]  05/23/2014, 12:39 PM   LOS: 9 days

## 2014-05-24 ENCOUNTER — Inpatient Hospital Stay (HOSPITAL_COMMUNITY): Payer: Medicare Other

## 2014-05-24 LAB — BASIC METABOLIC PANEL
ANION GAP: 11 (ref 5–15)
BUN: 12 mg/dL (ref 6–23)
CALCIUM: 8 mg/dL — AB (ref 8.4–10.5)
CHLORIDE: 103 meq/L (ref 96–112)
CO2: 28 mEq/L (ref 19–32)
Creatinine, Ser: 0.64 mg/dL (ref 0.50–1.10)
GFR calc Af Amer: >90 mL/min (ref >90)
GFR calc non Af Amer: >90 mL/min (ref >90)
GLUCOSE: 142 mg/dL — AB (ref 70–99)
POTASSIUM: 4.9 meq/L (ref 3.7–5.3)
SODIUM: 142 meq/L (ref 137–147)

## 2014-05-24 LAB — CBC
HCT: 36.5 % (ref 36.0–46.0)
Hemoglobin: 11.5 g/dL — ABNORMAL LOW (ref 12.0–15.0)
MCH: 30 pg (ref 26.0–34.0)
MCHC: 31.5 g/dL (ref 30.0–36.0)
MCV: 95.3 fL (ref 78.0–100.0)
Platelets: 77 10*3/uL — ABNORMAL LOW (ref 150–400)
RBC: 3.83 MIL/uL — ABNORMAL LOW (ref 3.87–5.11)
RDW: 17.3 % — ABNORMAL HIGH (ref 11.5–15.5)
WBC: 12.2 10*3/uL — ABNORMAL HIGH (ref 4.0–10.5)

## 2014-05-24 MED ORDER — LORAZEPAM 2 MG/ML IJ SOLN
1.0000 mg | Freq: Once | INTRAMUSCULAR | Status: AC
  Administered 2014-05-24: 05:00:00 via INTRAVENOUS
  Filled 2014-05-24: qty 1

## 2014-05-24 MED ORDER — ACETAMINOPHEN 650 MG RE SUPP
650.0000 mg | Freq: Four times a day (QID) | RECTAL | Status: DC | PRN
  Administered 2014-05-24 – 2014-05-30 (×3): 650 mg via RECTAL
  Filled 2014-05-24 (×3): qty 1

## 2014-05-24 MED ORDER — SODIUM CHLORIDE 0.9 % IV BOLUS (SEPSIS)
250.0000 mL | Freq: Once | INTRAVENOUS | Status: AC
  Administered 2014-05-24: 250 mL via INTRAVENOUS

## 2014-05-24 MED ORDER — OXYCODONE HCL 5 MG/5ML PO SOLN
5.0000 mg | Freq: Once | ORAL | Status: AC
  Administered 2014-05-24: 5 mg via ORAL
  Filled 2014-05-24: qty 5

## 2014-05-24 NOTE — Progress Notes (Signed)
Pt has been very restless this evening. While repositioning pt, RN noticed pt left grand toe nail was bent back/hanging. RN clipped off toe nail, slight bleeding occurred. Washed with warm soapy water, placed ointment and covered with dressing. Pt tolerated well.

## 2014-05-24 NOTE — Progress Notes (Signed)
Speech Language Pathology Treatment: Dysphagia  Patient Details Name: Dawn [name] MRN: [id number] DOB: 02-22-1946 Today's Date: 05/24/2014 Time: 1017-5102 SLP Time Calculation (min): 10 min  Assessment / Plan / Recommendation Clinical Impression  Pt. Lethargic requiring total multimodal stimulation for moderate arousal.  Labial seal on spoon appeared to be reflexive as lips immediately closed around spoon as puree placed in oral cavity then opened when spoon removed.  Despite manual labial closure via SLP , dry spoon presentations, pt. Unable to manipulate or propel applesauce which eventually was suctioned.  Prognosis for po's at present appears fair-poor.  Agree that family should decide on more permanent means of nutrition (with risks) or Palliative/comfort feeds.  Frequency has been decreased to 2 x week due to decreased progress toward goals.  SLP will check on status later in week.    HPI HPI: Pt is a 68 y.o. female w/ PMH of Seizures; Osteoporosis; Thyroid disease; High cholesterol; Cataracts, bilateral; Optic atrophy; Paraparesis; Hypothyroidism; Anginal pain; and Coronary artery disease. Baseline intellectual disability, wheelchair bound, nonverbal. stopped eating past few days and seemed unable to swallow and has been pocketing food. Has had similar episodes in the past and has recovered. Likely UTI. Suspected aspiration but CXR clear. Normally on mech soft diet/ thin but downgraded to NTL at facility. Treated for PNA 2 weeks ago. Bedside swallow eval ordered to assess swallow function given hx of possible dysphagia.    Pertinent Vitals Pain Assessment: No/denies pain  SLP Plan  Continue with current plan of care    Recommendations Diet recommendations: Pudding-thick liquid Medication Administration: Via alternative means              Oral Care Recommendations:  (per protocol) Follow up Recommendations: Skilled Nursing facility Plan: Continue with current plan of care          Houston Siren M.Ed [employer] [phone]  05/24/2014

## 2014-05-24 NOTE — Progress Notes (Signed)
Pt remains restless with minimal sleep t/o night. [name], with Rapid response, at the bedside assessing with primary RN. Pt HR remains in the 130's. SR. Lung sounds are diminished on the LLL. Audible wheeze occasionally has been baseline. BS are hypoactive, some gaurding noted. Foley intact and draining. [facility] texted. NP called RN with new orders.

## 2014-05-24 NOTE — Progress Notes (Signed)
Dawn [name] TEAM 1 - Stepdown/ICU TEAM Progress Note  Dawn [name] OFB:[id number] DOB: 11/30/1945 DOA: 05/14/2014 PCP: [name]  Admit HPI / Brief Narrative: 68 y.o. PMH of Seizures; diastolic CHF, atrial fibrillation, pituitary microadenoma (diagnosed 2009), hypothyroidism; High cholesterol and CAD. She also has developmental disability and at baseline non verbal and wheelchair bound status  She presented after staff at the group home noticed she had stopped eating for the past few days and seemed to be unable to swallow. No apparent fever or chills. She had been pocketing food in her mouth.  Had dental visit the previous week. Reportedly she had similar episodes of these symptoms in the past and recovered.   Upon arrival to emerge department patient was noted to be hypotensive (SBP down to 70s). She was given IV fluids and after 2 L blood pressure improved to her baseline of 110s. Sister was at the bedside so the admitting MD discussed goals of care. Family wished for her to be limited code. Patient appeared to have urinary tract infection leading to sepsis. Zosyn and vancomycin had been started by the EDP. There was also concern over possible aspiration although chest x-ray was unremarkable. She had been on mechanical soft diet for years with thin fluids but for several days prior to presentation she was switched to nectar thick due to concerns of active aspiration.dysphagia progression. For the 24 hours prior to presenatation she had stopped eating and drinking altogether. Per group home 2 weeks ago patient was treated for mild pneumonia.   HPI/Subjective: Non communicative as per baseline.   Assessment/Plan:  Sepsis / septic shock due to pan sensitive E coli UTI -completed a 7 day abx course - blood cx negative x 5 days - sepsis physiology has resolved   Low grade fever -pt with tachycardia and wheezing last night and now with low grade fever so will check PCXR  Right facial  droop -non contrasted CT Head normal 8/4 - suspect this is her baseline facies   Cerebral palsy / Acute metabolic encephalopathy / dysphagia -has chronic dysphagia  -SLP has evaluated for several days and continue to recommend alternate route for diet/meds -placed feeding tube 8/5 and began feeds -SLP eval 8/10 with recommendation for pudding thick liquids at best but still rec PEG - d/w'd sister Rod Holler who wishes to pursue a PEG so ordered to be done by IR  Hypernatremia with dehydration -Na improved / normalized   Hypokalemia -repleted per tube and now resolved  Symptomatic anemia -8/1 transfused 3 units PRBC - anemia resolved - Hgb stable   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 [phone] Goals of care reviewed by MD on 8/2 at which time code status was changed to DNR - 8/6 Spoke at length with [name]- discussed need for temporary feeding tube and concerns over possible need for PEG - made aware that PEG may only prolong her life in setting of progressive dementia but still desires to pursue PEG (8/10) Disposition Plan: SDU due to inability to communicate needs to staff/call for help and high risk of aspiration if NG removed/dislodged - PEG pending  Consultants: NA  Procedures:  8/4 CT head without contrast - No acute intracranial abnormality. Prominent atrophy.  Antibiotics: Zosyn 7/31>>8/03 Vancomycin 7/31>>8/03 Rocephin 8/03 >>8/07  DVT prophylaxis: SCD   Objective: VITAL SIGNS: Temp: 99.8 F (37.7 C) (08/10 1130) Temp src: Oral (08/10 1130) BP: 111/52 mmHg (08/10  1130) Pulse Rate: 104 (08/10 1130) SPO2; 100% on 2 L via [place] FIO2:  Intake/Output Summary (Last 24 hours) at 05/24/14 1344 Last data filed at 05/24/14 0826  Gross per 24 hour  Intake    325 ml  Output   1775 ml  Net  -1450 ml   Exam: General: no respiratory distress - awake with eyes open Lungs: Clear to auscultation bilaterally Cardiovascular:  Regular rhythm without murmur gallop or rub  Abdomen: Nontender, nondistended, soft, bowel sounds positive, no rebound, no ascites, no appreciable mass-TF infusing per NGT Extremities: No significant cyanosis, clubbing - trace edema  Data Reviewed:  Basic Metabolic Panel:  Recent Labs Lab 05/18/14 0342  05/19/14 0449 05/20/14 0500 05/22/14 0420 05/23/14 0300 05/24/14 0245  NA 154*  < > 154* 147 148* 145 142  K 3.3*  < > 3.6* 3.2* 4.9 4.5 4.9  CL 118*  < > 119* 111 112 108 103  CO2 27  < > 25 25 28 29 28   GLUCOSE 129*  < > 136* 115* 139* 147* 142*  BUN 18  < > 11 10 12 12 12   CREATININE 1.10  < > 0.76 0.82 0.77 0.63 0.64  CALCIUM 7.4*  < > 7.7* 7.9* 7.8* 7.8* 8.0*  MG 1.8  --   --   --   --   --   --   < > = values in this interval not displayed.  Liver Function Tests:  Recent Labs Lab 05/23/14 0300  AST 19  ALT 11  ALKPHOS 148*  BILITOT 0.2*  PROT 6.1  ALBUMIN 1.3*   CBC:  Recent Labs Lab 05/18/14 0345 05/19/14 0449 05/20/14 0159 05/22/14 0420 05/24/14 0245  WBC 17.0* 14.3* 17.5* 12.1* 12.2*  NEUTROABS  --  11.7*  --   --   --   HGB 12.9 13.3 13.1 11.6* 11.5*  HCT 38.2 40.3 39.3 34.8* 36.5  MCV 89.5 92.6 89.5 90.9 95.3  PLT 42* 31* 31* 41* 77*    Recent Results (from the past 240 hour(s))  URINE CULTURE     Status: None   Collection Time    05/14/14  5:19 PM      Result Value Ref Range Status   Specimen Description URINE, CATHETERIZED   Final   Special Requests NONE   Final   Culture  Setup Time     Final   Value: 05/14/2014 21:39     Performed at [facility]     Final    Value: >=100,000 COLONIES/ML     Performed at [facility]   Culture     Final   Value: ESCHERICHIA COLI     Performed at [facility]   Report Status 05/16/2014 FINAL   Final   Organism ID, Bacteria ESCHERICHIA COLI   Final  CULTURE, BLOOD (ROUTINE X 2)     Status: None   Collection Time    05/14/14  5:47 PM      Result Value Ref Range Status   Specimen Description BLOOD RIGHT ANTECUBITAL   Final   Special Requests BOTTLES DRAWN AEROBIC AND ANAEROBIC 2CC   Final   Culture  Setup Time  Final   Value: 05/14/2014 21:13     Performed at [facility]   Culture     Final   Value: NO GROWTH 5 DAYS     Performed at [facility]   Report Status 05/20/2014 FINAL   Final  CULTURE, BLOOD (ROUTINE X 2)     Status: None   Collection Time    05/14/14  5:47 PM      Result Value Ref Range Status   Specimen Description BLOOD RIGHT ANTECUBITAL   Final   Special Requests BOTTLES DRAWN AEROBIC AND ANAEROBIC 2CC   Final   Culture  Setup Time     Final   Value: 05/14/2014 21:13     Performed at [facility]   Culture     Final   Value: NO GROWTH 5 DAYS     Performed at [facility]   Report Status 05/20/2014 FINAL   Final  MRSA PCR SCREENING     Status: None   Collection Time    05/14/14 10:29 PM      Result Value Ref Range Status   MRSA by PCR NEGATIVE  NEGATIVE Final   Comment:            The GeneXpert MRSA Assay (FDA     approved for NASAL specimens     only), is one component of a     comprehensive MRSA colonization     surveillance program. It is not     intended to diagnose MRSA     infection nor to guide or     monitor treatment for     MRSA infections.     Studies:  Recent x-ray studies have been reviewed in detail by the Attending Physician  Scheduled Meds:  Scheduled Meds: . antiseptic oral rinse  7 mL Mouth Rinse BID  . chlorhexidine  15 mL Mouth Rinse BID  . docusate  100 mg Per Tube BID  . feeding supplement (JEVITY  1.2 CAL)  1,000 mL Per Tube Q24H  . free water  200 mL Per Tube 4 times per day  . levETIRAcetam  500 mg Per Tube Q0600  . levETIRAcetam  750 mg Per Tube BID  . levothyroxine  75 mcg Per Tube QAC breakfast  . Valproic Acid  750 mg Per Tube TID    Time spent on care of this patient: 25 mins  Erin Hearing, ANP [facility] For Consults/Admissions - Flow Manager - [phone] Office  [phone] Pager [phone]  On-Call/Text Page:      [email or web address]      password [facility]  05/24/2014, 1:44 PM   LOS: 10 days   I have personally examined this patient and reviewed the entire database. I have reviewed the above note, made any necessary editorial changes, and agree with its content.  Cherene Altes, MD [facility]

## 2014-05-24 NOTE — Progress Notes (Signed)
IR PA aware of request for percutaneous gastrostomy tube. Patient with temp of 100.9 today, will need patient to be afebrile x 48 hrs for procedure. Dr. Barbie Banner has reviewed patient's images, anatomy is amendable to percutaneous approach, patient will need barium enema at time of procedure.   Tsosie Billing PA-C Interventional Radiology  05/24/14 2:06 PM

## 2014-05-24 NOTE — Progress Notes (Signed)
Pt has relaxed some. HR has decreased, though RR has increased. Note low grade fever- see vital sign flowsheet. Informed [facility] midlevel per text. No new orders at this time.

## 2014-05-25 ENCOUNTER — Inpatient Hospital Stay (HOSPITAL_COMMUNITY): Payer: Medicare Other

## 2014-05-25 LAB — URINALYSIS, ROUTINE W REFLEX MICROSCOPIC
Bilirubin Urine: NEGATIVE
Glucose, UA: NEGATIVE mg/dL
Ketones, ur: NEGATIVE mg/dL
NITRITE: NEGATIVE
PROTEIN: NEGATIVE mg/dL
Specific Gravity, Urine: 1.008 (ref 1.005–1.030)
UROBILINOGEN UA: 0.2 mg/dL (ref 0.0–1.0)
pH: 8 (ref 5.0–8.0)

## 2014-05-25 LAB — URINE MICROSCOPIC-ADD ON

## 2014-05-25 LAB — CBC
HEMATOCRIT: 29.4 % — AB (ref 36.0–46.0)
Hemoglobin: 9.2 g/dL — ABNORMAL LOW (ref 12.0–15.0)
MCH: 29.8 pg (ref 26.0–34.0)
MCHC: 31.3 g/dL (ref 30.0–36.0)
MCV: 95.1 fL (ref 78.0–100.0)
Platelets: 85 10*3/uL — ABNORMAL LOW (ref 150–400)
RBC: 3.09 MIL/uL — AB (ref 3.87–5.11)
RDW: 17.1 % — ABNORMAL HIGH (ref 11.5–15.5)
WBC: 11.8 10*3/uL — ABNORMAL HIGH (ref 4.0–10.5)

## 2014-05-25 LAB — PROTIME-INR
INR: 1.11 (ref 0.00–1.49)
PROTHROMBIN TIME: 14.3 s (ref 11.6–15.2)

## 2014-05-25 LAB — APTT: aPTT: 39 seconds — ABNORMAL HIGH (ref 24–37)

## 2014-05-25 MED ORDER — DEXTROSE 5 % IV SOLN
1.0000 g | INTRAVENOUS | Status: DC
  Administered 2014-05-25: 1 g via INTRAVENOUS
  Filled 2014-05-25 (×2): qty 10

## 2014-05-25 NOTE — H&P (Signed)
Dawn [name] is an 68 y.o. female.   Chief Complaint: Mentally disabled pt; wheel chair bound/paraperesis Lives in SNF/group home Has not been eating x several days Not swallowing; dysphagia Probable aspiration Admitted with prob pneumonia and UTI/ fever; hypotension Swallow study failed x 2 NG was placed and pt has pulled out 1-2x Malnutrition; dehydration; long term care Request for percutaneous gastric tube placement Dr Barbie Banner has reviewed films and chart I have seen and examined pt Now scheduled for perc G tube when appropriate T: 100.9 8/10 am- afeb since 3pm 8/10  HPI: mentally disabled; malnutrition; dysphagia; Sz; hypothyroid; HLD; B cataracts; paraparesis; CAD; pituitary microadenoma; afib  Past Medical History  Diagnosis Date  . Seizures   . Osteoporosis   . Thyroid disease     hypothyroid  . High cholesterol   . Cataracts, bilateral   . Optic atrophy   . Paraparesis     mild  . Hypothyroidism   . Anginal pain   . Coronary artery disease     Past Surgical History  Procedure Laterality Date  . Midline incision    . Central venous catheter insertion  05/15/2014         Family History  Problem Relation Age of Onset  . Colon cancer Mother   . Diabetes type II Sister    Social History:  reports that Dawn [name] has never smoked. Dawn [name] has never used smokeless tobacco. Dawn [name] reports that Dawn [name] does not drink alcohol or use illicit drugs.  Allergies:  Allergies  Allergen Reactions  . Carvedilol   . Ppd [Tuberculin Purified Protein Derivative]     Per MAR    Medications Prior to Admission  Medication Sig Dispense Refill  . aspirin 81 MG chewable tablet Chew 81 mg by mouth daily.      Marland Kitchen atorvastatin (LIPITOR) 10 MG tablet Take 10 mg by mouth daily.      . calcium-vitamin D (OYSTER CALCIUM 500 + D) 500-200 MG-UNIT per tablet Take 1 tablet by mouth daily.      . divalproex (DEPAKOTE ER) 250 MG 24 hr tablet Take 750 mg by mouth 3 (three) times daily.      Marland Kitchen  levETIRAcetam (KEPPRA) 500 MG tablet Take 500-750 mg by mouth 3 (three) times daily. 500 mg at 0800, 750 mg at 1400 and 750 mg at 2000      . levothyroxine (SYNTHROID, LEVOTHROID) 75 MCG tablet Take 75 mcg by mouth daily.      Marland Kitchen LORazepam (ATIVAN) 2 MG tablet Take 2 mg by mouth See admin instructions. Take 20m 1hour prior to dental procedure      . polyethylene glycol (MIRALAX / GLYCOLAX) packet Take 17 g by mouth daily.      . vitamin C (ASCORBIC ACID) 500 MG tablet Take 500 mg by mouth 3 (three) times daily.         Results for orders placed during the hospital encounter of 05/14/14 (from the past 48 hour(s))  BASIC METABOLIC PANEL     Status: Abnormal   Collection Time    05/24/14  2:45 AM      Result Value Ref Range   Sodium 142  137 - 147 mEq/L   Potassium 4.9  3.7 - 5.3 mEq/L   Chloride 103  96 - 112 mEq/L   CO2 28  19 - 32 mEq/L   Glucose, Bld 142 (*) 70 - 99 mg/dL   BUN 12  6 - 23 mg/dL   Creatinine, Ser 0.64  0.50 - 1.10 mg/dL   Calcium 8.0 (*) 8.4 - 10.5 mg/dL   GFR calc non Af Amer >90  >90 mL/min   GFR calc Af Amer >90  >90 mL/min   Comment: (NOTE)     The eGFR has been calculated using the CKD EPI equation.     This calculation has not been validated in all clinical situations.     eGFR's persistently <90 mL/min signify possible Chronic Kidney     Disease.   Anion gap 11  5 - 15  CBC     Status: Abnormal   Collection Time    05/24/14  2:45 AM      Result Value Ref Range   WBC 12.2 (*) 4.0 - 10.5 K/uL   RBC 3.83 (*) 3.87 - 5.11 MIL/uL   Hemoglobin 11.5 (*) 12.0 - 15.0 g/dL   HCT 36.5  36.0 - 46.0 %   MCV 95.3  78.0 - 100.0 fL   MCH 30.0  26.0 - 34.0 pg   MCHC 31.5  30.0 - 36.0 g/dL   RDW 17.3 (*) 11.5 - 15.5 %   Platelets 77 (*) 150 - 400 K/uL   Comment: REPEATED TO VERIFY     CONSISTENT WITH PREVIOUS RESULT  CBC     Status: Abnormal   Collection Time    05/25/14  5:00 AM      Result Value Ref Range   WBC 11.8 (*) 4.0 - 10.5 K/uL   RBC 3.09 (*) 3.87 - 5.11  MIL/uL   Hemoglobin 9.2 (*) 12.0 - 15.0 g/dL   Comment: REPEATED TO VERIFY   HCT 29.4 (*) 36.0 - 46.0 %   MCV 95.1  78.0 - 100.0 fL   MCH 29.8  26.0 - 34.0 pg   MCHC 31.3  30.0 - 36.0 g/dL   RDW 17.1 (*) 11.5 - 15.5 %   Platelets 85 (*) 150 - 400 K/uL  URINALYSIS, ROUTINE W REFLEX MICROSCOPIC     Status: Abnormal   Collection Time    05/25/14  9:47 AM      Result Value Ref Range   Color, Urine YELLOW  YELLOW   APPearance CLEAR  CLEAR   Specific Gravity, Urine 1.008  1.005 - 1.030   pH 8.0  5.0 - 8.0   Glucose, UA NEGATIVE  NEGATIVE mg/dL   Hgb urine dipstick SMALL (*) NEGATIVE   Bilirubin Urine NEGATIVE  NEGATIVE   Ketones, ur NEGATIVE  NEGATIVE mg/dL   Protein, ur NEGATIVE  NEGATIVE mg/dL   Urobilinogen, UA 0.2  0.0 - 1.0 mg/dL   Nitrite NEGATIVE  NEGATIVE   Leukocytes, UA MODERATE (*) NEGATIVE  URINE MICROSCOPIC-ADD ON     Status: None   Collection Time    05/25/14  9:47 AM      Result Value Ref Range   Squamous Epithelial / LPF RARE  RARE   WBC, UA 7-10  <3 WBC/hpf   RBC / HPF 3-6  <3 RBC/hpf   Bacteria, UA RARE  RARE   Dg Chest Port 1 View  05/24/2014   CLINICAL DATA:  Fever, possible aspiration  EXAM: PORTABLE CHEST - 1 VIEW  COMPARISON:  05/21/2014  FINDINGS: No change in position of left central line or NG tube.  Limited inspiratory effect with elevated left diaphragm similar to prior study. Heart size is normal. Hazy opacity right lung base, mild and stable. Left, there is also hazy basilar opacity, stable. Although somewhat likely exaggerated by limited inspiratory effect,  there appears to be mild vascular congestion.  IMPRESSION: Hazy bibasilar opacities unchanged from prior study.  Mild vascular congestion.   Electronically Signed   By: Skipper Cliche M.D.   On: 05/24/2014 15:38   Dg Abd Portable 1v  05/25/2014   CLINICAL DATA:  Feeding tube placement.  EXAM: PORTABLE ABDOMEN - 1 VIEW  COMPARISON:  Single view of the abdomen 05/19/2014.  FINDINGS: Feeding tube is in  place with the tip projecting in the fundus of the stomach. There is a massive volume of stool in the ascending and transverse colon. The bowel gas pattern is nonobstructive.  IMPRESSION: Feeding tube tip is in the fundus of the stomach.  Massive volume of stool ascending and transverse colon.   Electronically Signed   By: Inge Rise M.D.   On: 05/25/2014 08:00    Review of Systems  Constitutional: Positive for fever and weight loss.       Afeb 3pm 8/10  Respiratory: Positive for shortness of breath.   Cardiovascular: Negative for chest pain.  Gastrointestinal: Positive for nausea.  Genitourinary:       UTI treated; foley out  Neurological: Positive for weakness.    Blood pressure 93/58, pulse 89, temperature 97.4 F (36.3 C), temperature source Axillary, resp. rate 20, height 5' 1.81" (1.57 m), weight 65.4 kg (144 lb 2.9 oz), SpO2 99.00%. Physical Exam  Constitutional: Dawn [name] appears well-developed.  Non verbal  Cardiovascular: Normal rate.   No murmur heard. Respiratory: Effort normal. Dawn [name] has no wheezes.  GI: Soft. There is tenderness.  Musculoskeletal:  Able to move all extremities passively  Neurological:  Non verbal; does not focus on me  Skin: Skin is warm.  Psychiatric:  Consented sister via phone     Assessment/Plan Scheduled for Perc G tube when afeb x 48 hrs Dysphagia; malnutrition/dehydration Aspiration Long term care; mentally disabled Plan for prob 8/13 am Sister aware of procedure benefits and risks and agreeable to proceed connset signed and in chart    [place] A 05/25/2014, 1:38 PM

## 2014-05-25 NOTE — Progress Notes (Addendum)
Pt pulled out Panda tube. K.Schorr, NP notified. Orders given to replace tube. Tube feeding on hold. Waiting on Abdominal X-ray to confirm placement. Will continue to monitor.

## 2014-05-25 NOTE — Progress Notes (Signed)
[facility] TEAM 1 - Stepdown/ICU TEAM Progress Note  Dawn [name] WCH:[id number] DOB: 11/20/1945 DOA: 05/14/2014 PCP: [name]  Admit HPI / Brief Narrative: 68 y.o. PMH of Seizures; diastolic CHF, atrial fibrillation, pituitary microadenoma (diagnosed 2009), hypothyroidism; High cholesterol and CAD. She also has developmental disability and at baseline non verbal and wheelchair bound status  She presented after staff at the group home noticed she had stopped eating for the past few days and seemed to be unable to swallow. No apparent fever or chills. She had been pocketing food in her mouth.  Had dental visit the previous week. Reportedly she had similar episodes of these symptoms in the past and recovered.   Upon arrival to emerge department patient was noted to be hypotensive (SBP down to 70s). She was given IV fluids and after 2 L blood pressure improved to her baseline of 110s. Sister was at the bedside so the admitting MD discussed goals of care. Family wished for her to be limited code. Patient appeared to have urinary tract infection leading to sepsis. Zosyn and vancomycin had been started by the EDP. There was also concern over possible aspiration although chest x-ray was unremarkable. She had been on mechanical soft diet for years with thin fluids but for several days prior to presentation she was switched to nectar thick due to concerns of active aspiration.dysphagia progression. For the 24 hours prior to presenatation she had stopped eating and drinking altogether. Per group home 2 weeks ago patient was treated for mild pneumonia.   HPI/Subjective: Non communicative as per baseline.   Assessment/Plan:  Sepsis / septic shock due to pan sensitive E coli UTI -completed a 7 day abx course - blood cx negative x 5 days - sepsis physiology has resolved   Low grade fever -pt with tachycardia and wheezing into 8/10 and now with low grade fever- PCXR negative -TM 101 past 24 hrs  so ?? UTI vs bacteremia  -ck UA/cx and dc foley-UA appears c/w UTI so begin empiric Rocephin -insert PIV and dc CL- ck blood cx's -IR holding PEG until fever resolves  Right facial droop -non contrasted CT Head normal 8/4 - suspect this is her baseline facies   Cerebral palsy / Acute metabolic encephalopathy / dysphagia -has chronic dysphagia  -SLP has evaluated for several days and continued to recommend alternate route for diet/meds -placed feeding tube 8/5 and began feeds -SLP eval 8/10 with recommendation for pudding thick liquids at best but still rec PEG - d/w'd sister Rod Holler who wishes to pursue a PEG so ordered to be done by IR -pulled out feeding tube last night for the 3rd time- replaced  Hypernatremia with dehydration -Na improved / normalized   Hypokalemia -repleted per tube and now resolved  Symptomatic anemia -8/1 transfused 3 units PRBC - anemia resolved - Hgb stable   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 - PLT count climbing slowly   Chronic Diastolic heart failure  -no evidence of volume overload at this time   Paroxysmal atrial fibrillation  -Currrently sinus  Dehydration  -resolved  Hypocalcemia -Corrected calcium normal  Seizure disorder  -Continue Keppra and Depacon -repeat drug levels pending     DVT Prophylaxis: SCDs Code Status: DNR Family Communication: [name] [phone] Goals of care reviewed by MD on 8/2 at which time code status was changed to DNR - 8/6 Spoke at length with [name]- discussed need for temporary feeding tube and concerns over possible need for PEG - made aware  that PEG may only prolong her life in setting of progressive dementia but still desires to pursue PEG (8/10) Disposition Plan: SDU due to inability to communicate needs to staff/call for help and high risk of aspiration if NG removed/dislodged - PEG pending after fever resolves  Consultants: NA  Procedures:  8/4 CT head without contrast - No acute  intracranial abnormality. Prominent atrophy.  Antibiotics: Zosyn 7/31>>8/03 Vancomycin 7/31>>8/03 Rocephin 8/03 >>8/07 Rocephin 8/11 >>>  DVT prophylaxis: SCD   Objective: VITAL SIGNS: Temp: 97.2 F (36.2 C) (08/11 0839) Temp src: Oral (08/11 0839) BP: 101/70 mmHg (08/11 0839) Pulse Rate: 93 (08/11 0839) SPO2; 100% on 2 L via [place] FIO2:  Intake/Output Summary (Last 24 hours) at 05/25/14 1024 Last data filed at 05/25/14 0003  Gross per 24 hour  Intake   1115 ml  Output   1350 ml  Net   -235 ml   Exam: General: no respiratory distress - awake with eyes open-slightly restless but not agitated Lungs: Clear to auscultation bilaterally Cardiovascular:  Regular rhythm without murmur gallop or rub  Abdomen: Nontender, nondistended, soft, bowel sounds positive, no rebound, no ascites, no appreciable mass-TF infusing per NGT Extremities: No significant cyanosis, clubbing -minimal pedal edema which appears chronic  Data Reviewed:  Basic Metabolic Panel:  Recent Labs Lab 05/19/14 0449 05/20/14 0500 05/22/14 0420 05/23/14 0300 05/24/14 0245  NA 154* 147 148* 145 142  K 3.6* 3.2* 4.9 4.5 4.9  CL 119* 111 112 108 103  CO2 25 25 28 29 28   GLUCOSE 136* 115* 139* 147* 142*  BUN 11 10 12 12 12   CREATININE 0.76 0.82 0.77 0.63 0.64  CALCIUM 7.7* 7.9* 7.8* 7.8* 8.0*    Liver Function Tests:  Recent Labs Lab 05/23/14 0300  AST 19  ALT 11  ALKPHOS 148*  BILITOT 0.2*  PROT 6.1  ALBUMIN 1.3*   CBC:  Recent Labs Lab 05/19/14 0449 05/20/14 0159 05/22/14 0420 05/24/14 0245 05/25/14 0500  WBC 14.3* 17.5* 12.1* 12.2* 11.8*  NEUTROABS 11.7*  --   --   --   --   HGB 13.3 13.1 11.6* 11.5* 9.2*  HCT 40.3 39.3 34.8* 36.5 29.4*  MCV 92.6 89.5 90.9 95.3 95.1  PLT 31* 31* 41* 77* 85*    No results found for this or any previous visit (from the past 240 hour(s)).   Studies:  Recent x-ray studies have been reviewed in detail by the Attending Physician  Scheduled  Meds:  Scheduled Meds: . antiseptic oral rinse  7 mL Mouth Rinse BID  . chlorhexidine  15 mL Mouth Rinse BID  . docusate  100 mg Per Tube BID  . feeding supplement (JEVITY 1.2 CAL)  1,000 mL Per Tube Q24H  . free water  200 mL Per Tube 4 times per day  . levETIRAcetam  500 mg Per Tube Q0600  . levETIRAcetam  750 mg Per Tube BID  . levothyroxine  75 mcg Per Tube QAC breakfast  . Valproic Acid  750 mg Per Tube TID    Time spent on care of this patient: 25 mins  Erin Hearing, ANP [facility] For Consults/Admissions - Flow Manager - [phone] Office  [phone] Pager [phone]  On-Call/Text Page:      [email or web address]      password [facility]  05/25/2014, 10:24 AM   LOS: 11 days    Examined Pt and discussed A/P with ANP Ebony Hail and agree with above plan.  Pt w/ Multiple complex medical problems, > 40 min  spent in direct medical care

## 2014-05-25 NOTE — Progress Notes (Signed)
Patient ID: Dawn [name], female   DOB: 04-27-1946, 68 y.o.   MRN: [id number]    Request for perc G tube in IR  afeb since 3pm 8/10 Need afeb 48 hrs  Plan for prob 8/13  We will prepare pt for same

## 2014-05-25 NOTE — Progress Notes (Signed)
NUTRITION FOLLOW UP  INTERVENTION: Continue current TF regimen RD to follow for nutrition care plan  NUTRITION DIAGNOSIS: Inadequate oral intake related to inability to eat as evidenced by NPO status, ongoing  Goal: Pt to meet >/= 90% of their estimated nutrition needs, met  Monitor:  TF regimen & tolerance, weight, labs, I/O's  ASSESSMENT: 68 y.o. Female with PMH of seizures, osteoporosis, thyroid disease, and CAD; admitted with sepsis due to UTI and possible aspiration, severe dehydration due to poor by mouth intake likely secondary to severe tooth decay resulted in hypernatremia.   Jevity 1.2 formula currently infusing at 55 ml/hr via Panda tube (tip in fundus of stomach) providing 1584 kcals, 73 gm protein, 1065 ml of free water.  Noted patient pulled out feeding tube 5:54 AM; replaced at 08:00.  Speech Path notes reviewed.  Prognosis for PO diet initiation appears fair-poor.  IR request for PEG tube placement.  Patient with temp of 100.9 today and needs to be afebrile x 48 hours for procedure.  Height: Ht Readings from Last 1 Encounters:  05/14/14 5' 1.81" (1.57 m)    Weight: Wt Readings from Last 1 Encounters:  05/25/14 144 lb 2.9 oz (65.4 kg)  Admit wt 125 lb   BMI:  Body mass index is 26.53 kg/(m^2).  Estimated Nutritional Needs: Kcal: 1500-1700 Protein: 70-80 gm Fluid: 1.5-1.7 L  Skin: Intact  Diet Order: NPO   Intake/Output Summary (Last 24 hours) at 05/25/14 1136 Last data filed at 05/25/14 1057  Gross per 24 hour  Intake   1317 ml  Output   2320 ml  Net  -1003 ml   Labs:   Recent Labs Lab 05/22/14 0420 05/23/14 0300 05/24/14 0245  NA 148* 145 142  K 4.9 4.5 4.9  CL 112 108 103  CO2 _0 BUN _1 CREATININE 0.77 0.63 0.64  CALCIUM 7.8* 7.8* 8.0*  GLUCOSE 139* 147* 142*    Scheduled Meds: . antiseptic oral rinse  7 mL Mouth Rinse BID  . cefTRIAXone (ROCEPHIN)  IV  1 g Intravenous Q24H  . chlorhexidine  15 mL Mouth Rinse  BID  . docusate  100 mg Per Tube BID  . feeding supplement (JEVITY 1.2 CAL)  1,000 mL Per Tube Q24H  . free water  200 mL Per Tube 4 times per day  . levETIRAcetam  500 mg Per Tube Q0600  . levETIRAcetam  750 mg Per Tube BID  . levothyroxine  75 mcg Per Tube QAC breakfast  . Valproic Acid  750 mg Per Tube TID    Continuous Infusions: . dextrose 10 mL/hr at 05/24/14 2015    Past Medical History  Diagnosis Date  . Seizures   . Osteoporosis   . Thyroid disease     hypothyroid  . High cholesterol   . Cataracts, bilateral   . Optic atrophy   . Paraparesis     mild  . Hypothyroidism   . Anginal pain   . Coronary artery disease     Past Surgical History  Procedure Laterality Date  . Midline incision    . Central venous catheter insertion  05/15/2014         Arthur Holms, RD, LDN Pager #: [phone] After-Hours Pager #: [phone]

## 2014-05-26 LAB — BASIC METABOLIC PANEL
Anion gap: 9 (ref 5–15)
BUN: 14 mg/dL (ref 6–23)
CO2: 29 meq/L (ref 19–32)
Calcium: 7.8 mg/dL — ABNORMAL LOW (ref 8.4–10.5)
Chloride: 107 mEq/L (ref 96–112)
Creatinine, Ser: 0.55 mg/dL (ref 0.50–1.10)
GFR calc Af Amer: >90 mL/min (ref >90)
GFR calc non Af Amer: >90 mL/min (ref >90)
GLUCOSE: 155 mg/dL — AB (ref 70–99)
POTASSIUM: 4.4 meq/L (ref 3.7–5.3)
SODIUM: 145 meq/L (ref 137–147)

## 2014-05-26 LAB — URINE CULTURE
COLONY COUNT: NO GROWTH
Culture: NO GROWTH

## 2014-05-26 LAB — CBC
HEMATOCRIT: 29.1 % — AB (ref 36.0–46.0)
Hemoglobin: 9.5 g/dL — ABNORMAL LOW (ref 12.0–15.0)
MCH: 29.9 pg (ref 26.0–34.0)
MCHC: 32.6 g/dL (ref 30.0–36.0)
MCV: 91.5 fL (ref 78.0–100.0)
PLATELETS: 93 10*3/uL — AB (ref 150–400)
RBC: 3.18 MIL/uL — AB (ref 3.87–5.11)
RDW: 16.9 % — ABNORMAL HIGH (ref 11.5–15.5)
WBC: 10 10*3/uL (ref 4.0–10.5)

## 2014-05-26 LAB — LEVETIRACETAM LEVEL: LEVETIRACETAM: 50.3 ug/mL

## 2014-05-26 MED ORDER — CEFAZOLIN SODIUM-DEXTROSE 2-3 GM-% IV SOLR: 2.0000 g | Freq: Once | INTRAVENOUS | Status: DC

## 2014-05-26 MED ORDER — DEXTROSE 5 % IV SOLN
1.0000 g | INTRAVENOUS | Status: DC
  Administered 2014-05-26: 1 g via INTRAVENOUS
  Filled 2014-05-26 (×2): qty 10

## 2014-05-26 NOTE — Progress Notes (Signed)
Patient discussed with RNCM today- she will require a PEG placement - tentatively scheduled for tomorrow.  Patient is a resident of [facility] group home and thus will not be able to return there when medically stable. Active SNF search will be initiated.  PASARR submitted today- may require a Level 2 PASARR screen.  Patient's sister lives in [place] and would prefer a facility in or near [facility] it possible.  Lorie Phenix. Pauline Good, [place]

## 2014-05-26 NOTE — Progress Notes (Signed)
[facility] TEAM 1 - Stepdown/ICU TEAM Progress Note  Dawn [name] ZLD:[id number] DOB: 01-25-1946 DOA: 05/14/2014 PCP: [name]  Admit HPI / Brief Narrative: 68 y.o. PMH of Seizures; diastolic CHF, atrial fibrillation, pituitary microadenoma (diagnosed 2009), hypothyroidism; High cholesterol and CAD. She also has developmental disability and at baseline non verbal and wheelchair bound status  She presented after staff at the group home noticed she had stopped eating for the past few days and seemed to be unable to swallow. No apparent fever or chills. She had been pocketing food in her mouth.  Had dental visit the previous week. Reportedly she had similar episodes of these symptoms in the past and recovered.   Upon arrival to emerge department patient was noted to be hypotensive (SBP down to 70s). She was given IV fluids and after 2 L blood pressure improved to her baseline of 110s. Sister was at the bedside so the admitting MD discussed goals of care. Family wished for her to be limited code. Patient appeared to have urinary tract infection leading to sepsis. Zosyn and vancomycin had been started by the EDP. There was also concern over possible aspiration although chest x-ray was unremarkable. She had been on mechanical soft diet for years with thin fluids but for several days prior to presentation she was switched to nectar thick due to concerns of active aspiration.dysphagia progression. For the 24 hours prior to presenatation she had stopped eating and drinking altogether. Per group home 2 weeks ago patient was treated for mild pneumonia.   HPI/Subjective: Non communicative as per baseline. Requiring mitten restraints.  Assessment/Plan:  Sepsis / septic shock due to pan sensitive E coli UTI -completed a 7 day abx course - blood cx negative x 5 days - sepsis physiology has resolved   Low grade fever / ? UTI -UA appeared c/w UTI - started on Rocephin 8/11 -blood cxs pending -  cath tip negative  Right facial droop -non contrasted CT Head normal 8/4 - suspect this is her baseline facies   Cerebral palsy / Acute metabolic encephalopathy / dysphagia -has chronic dysphagia  -SLP has evaluated for several days and continued to recommend alternate route for diet/meds -placed feeding tube 8/5 and began feeds -SLP eval 8/10 with recommendation for pudding thick liquids at best but still rec PEG - d/w'd sister Rod Holler who wishes to pursue a PEG so ordered to be done by IR  -pulled out feeding tube for the 3rd time on 8/10 - replaced  Hypernatremia with dehydration -Na improved / normalized   Hypokalemia -repleted per tube and now resolved  Symptomatic anemia -8/1 transfused 3 units PRBC - Hgb stable   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 [phone] Goals of care reviewed by MD on 8/2 at which time code status was changed to DNR - 8/6 Spoke at length with [name]- discussed need for temporary feeding tube and concerns over possible need for PEG - made aware that PEG may only prolong her life in setting of progressive dementia but still desires to pursue PEG (8/10) Disposition Plan: SDU due to inability to communicate needs to staff/call for help and high  risk of aspiration if NG removed/dislodged - PEG pending after fever resolves  Consultants: NA  Procedures:  8/4 CT head without contrast - No acute intracranial abnormality. Prominent atrophy.  Antibiotics: Zosyn 7/31>>8/03 Vancomycin 7/31>>8/03 Rocephin 8/03 >>8/07 Rocephin 8/11 >  DVT  prophylaxis: SCD   Objective: VITAL SIGNS: Temp: 97.4 F (36.3 C) (08/12 0832) Temp src: Oral (08/12 0832) BP: 97/82 mmHg (08/12 0832) Pulse Rate: 87 (08/12 0832) SPO2; 100% on 2 L via Blue FIO2:  Intake/Output Summary (Last 24 hours) at 05/26/14 1059 Last data filed at 05/26/14 0500  Gross per 24 hour  Intake   1325 ml  Output      0 ml  Net   1325 ml   Exam: General: no respiratory distress - awake with eyes open - not agitated Lungs: Clear to auscultation bilaterally, RA Cardiovascular:  Regular rhythm without murmur gallop or rub  Abdomen: Nontender, nondistended, soft, bowel sounds positive, no rebound, no ascites, no appreciable mass Extremities: No significant cyanosis, clubbing - minimal pedal edema which appears chronic - mittens in place  Data Reviewed:  Basic Metabolic Panel:  Recent Labs Lab 05/20/14 0500 05/22/14 0420 05/23/14 0300 05/24/14 0245 05/26/14 0359  NA 147 148* 145 142 145  K 3.2* 4.9 4.5 4.9 4.4  CL 111 112 108 103 107  CO2 25 28 29 28 29   GLUCOSE 115* 139* 147* 142* 155*  BUN 10 12 12 12 14   CREATININE 0.82 0.77 0.63 0.64 0.55  CALCIUM 7.9* 7.8* 7.8* 8.0* 7.8*    Liver Function Tests:  Recent Labs Lab 05/23/14 0300  AST 19  ALT 11  ALKPHOS 148*  BILITOT 0.2*  PROT 6.1  ALBUMIN 1.3*   CBC:  Recent Labs Lab 05/20/14 0159 05/22/14 0420 05/24/14 0245 05/25/14 0500 05/26/14 0359  WBC 17.5* 12.1* 12.2* 11.8* 10.0  HGB 13.1 11.6* 11.5* 9.2* 9.5*  HCT 39.3 34.8* 36.5 29.4* 29.1*  MCV 89.5 90.9 95.3 95.1 91.5  PLT 31* 41* 77* 85* 93*    Recent Results (from the past 240 hour(s))  CULTURE, BLOOD (ROUTINE X 2)     Status: None   Collection Time    05/25/14 12:15 PM      Result Value Ref Range Status   Specimen Description BLOOD LEFT HAND   Final   Special Requests BOTTLES DRAWN AEROBIC AND ANAEROBIC 10CC   Final   Culture  Setup Time     Final   Value: 05/25/2014 16:18     Performed at [facility]   Culture      Final   Value:        BLOOD CULTURE RECEIVED NO GROWTH TO DATE CULTURE WILL BE HELD FOR 5 DAYS BEFORE ISSUING A FINAL NEGATIVE REPORT     Performed at [facility]   Report Status PENDING   Incomplete  CULTURE, BLOOD (ROUTINE X 2)     Status: None   Collection Time    05/25/14 12:27 PM      Result Value Ref Range Status   Specimen Description BLOOD RIGHT HAND   Final   Special Requests BOTTLES DRAWN AEROBIC AND ANAEROBIC 10CC   Final   Culture  Setup Time     Final   Value: 05/25/2014 16:18     Performed at [facility]   Culture     Final   Value:        BLOOD CULTURE RECEIVED NO GROWTH TO DATE CULTURE WILL BE HELD FOR 5  DAYS BEFORE ISSUING A FINAL NEGATIVE REPORT     Performed at [facility]   Report Status PENDING   Incomplete  CATH TIP CULTURE     Status: None   Collection Time    05/25/14  3:30 PM      Result Value Ref Range Status   Specimen Description CATH TIP   Final   Special Requests LEFT SUBCLAVIAN   Final   Culture     Final   Value: NO GROWTH     Performed at [facility]   Report Status PENDING   Incomplete     Studies:  Recent x-ray studies have been reviewed in detail by the Attending Physician  Scheduled Meds:  Scheduled Meds: . antiseptic oral rinse  7 mL Mouth Rinse BID  . cefTRIAXone (ROCEPHIN)  IV  1 g Intravenous Q24H  . chlorhexidine  15 mL Mouth Rinse BID  . docusate  100 mg Per Tube BID  . feeding supplement (JEVITY 1.2 CAL)  1,000 mL Per Tube Q24H  . free water  200 mL Per Tube 4 times per day  . levETIRAcetam  500 mg Per Tube Q0600  . levETIRAcetam  750 mg Per Tube BID  . levothyroxine  75 mcg Per Tube QAC breakfast  . Valproic Acid  750 mg Per Tube TID    Time spent on care of this patient: 25 mins  Erin Hearing, ANP [facility] For Consults/Admissions - Flow Manager - [phone] Office  [phone] Pager [phone]  On-Call/Text Page:      [email or web address]      password [facility]  05/26/2014,  10:59 AM   LOS: 12 days   I have personally examined this patient and reviewed the entire database. I have reviewed the above note, made any necessary editorial changes, and agree with its content.  Cherene Altes, MD [facility]

## 2014-05-27 ENCOUNTER — Inpatient Hospital Stay (HOSPITAL_COMMUNITY): Payer: Medicare Other

## 2014-05-27 LAB — GLUCOSE, CAPILLARY
Glucose-Capillary: 96 mg/dL (ref 70–99)
Glucose-Capillary: 92 mg/dL (ref 70–99)

## 2014-05-27 MED ORDER — CEFAZOLIN SODIUM-DEXTROSE 2-3 GM-% IV SOLR
2.0000 g | Freq: Once | INTRAVENOUS | Status: AC
  Administered 2014-05-28 (×2): 2 g via INTRAVENOUS
  Filled 2014-05-27: qty 50

## 2014-05-27 NOTE — Progress Notes (Signed)
Speech Language Pathology Treatment: Dysphagia  Patient Details Name: YSENIA FILICE MRN: [id number] DOB: 05-Nov-1945 Today's Date: 05/27/2014 Time: 9038-3338 SLP Time Calculation (min): 10 min  Assessment / Plan / Recommendation Clinical Impression  Brief session this morning for oral-motor stimulation for facilitation of oral transit.  Oral care provided with demonstration of primative reflexes via closing mouth when lips touched, attempting to bite toothette.  No po's attempted (receiving PEG today).  Pt. Given total verbal/visual/tactile assist to follow commands for volitional oral movements, however she was unable to do so.  Prognosis for swallow function appears significantly decreased at this time.  She is receiving PEG this am; ST will sign off at this time.  Recommend pt. Receive ST at SNF when/if appropriate.   HPI HPI: Pt is a 68 y.o. female w/ PMH of Seizures; Osteoporosis; Thyroid disease; High cholesterol; Cataracts, bilateral; Optic atrophy; Paraparesis; Hypothyroidism; Anginal pain; and Coronary artery disease. Baseline intellectual disability, wheelchair bound, nonverbal. stopped eating past few days and seemed unable to swallow and has been pocketing food. Has had similar episodes in the past and has recovered. Likely UTI. Suspected aspiration but CXR clear. Normally on mech soft diet/ thin but downgraded to NTL at facility. Treated for PNA 2 weeks ago. Bedside swallow eval ordered to assess swallow function given hx of possible dysphagia.    Pertinent Vitals Pain Assessment:  (no signs pain)  SLP Plan  Discharge SLP treatment due to (comment) (poor prognosis for swallow function, no gains made)    Recommendations Diet recommendations: NPO Medication Administration: Via alternative means              Oral Care Recommendations:  (per protocol) Follow up Recommendations: Skilled Nursing facility Plan: Discharge SLP treatment due to (comment) (poor prognosis for swallow  function, no gains made)    GO     Houston Siren 05/27/2014, 8:54 AM [phone]

## 2014-05-27 NOTE — Progress Notes (Signed)
Patient ID: Dawn [name], female   DOB: August 29, 1946, 68 y.o.   MRN: [id number]   Had intended to move forward with Perc G tube today Unfortunately, stool burden too large(per KUB) to safely proceed per Dr Barbie Banner  Will order enema in room Will recheck KUB in am  Plan for poss G tube 8/14 New orders in chart

## 2014-05-27 NOTE — Progress Notes (Signed)
[place] TEAM 1 - Stepdown/ICU TEAM Progress Note  HARRY BARK NFA:[id number] DOB: November 21, 1945 DOA: 05/14/2014 PCP: [name]  Admit HPI / Brief Narrative: 68 y.o. PMH of Seizures; diastolic CHF, atrial fibrillation, pituitary microadenoma (diagnosed 2009), hypothyroidism; High cholesterol and CAD. She also has developmental disability and at baseline non verbal and wheelchair bound status  She presented after staff at the group home noticed she had stopped eating for the past few days and seemed to be unable to swallow. No apparent fever or chills. She had been pocketing food in her mouth.  Had dental visit the previous week. Reportedly she had similar episodes of these symptoms in the past and recovered.   Upon arrival to emerge department patient was noted to be hypotensive (SBP down to 70s). She was given IV fluids and after 2 L blood pressure improved to her baseline of 110s. Sister was at the bedside so the admitting MD discussed goals of care. Family wished for her to be limited code. Patient appeared to have urinary tract infection leading to sepsis. Zosyn and vancomycin had been started by the EDP. There was also concern over possible aspiration although chest x-ray was unremarkable. She had been on mechanical soft diet for years with thin fluids but for several days prior to presentation she was switched to nectar thick due to concerns of active aspiration.dysphagia progression. For the 24 hours prior to presenatation she had stopped eating and drinking altogether. Per group home 2 weeks ago patient was treated for mild pneumonia.   HPI/Subjective: Non communicative as per baseline. Alert and making some eye contact  Assessment/Plan:  Sepsis / septic shock due to pan sensitive E coli UTI -completed a 7 day abx course - blood cx negative x 5 days - sepsis physiology has resolved   Low grade fever / ? UTI -UA appeared c/w UTI  But culture no growth -blood cxs pending -  cath tip negative -suspect fevers and leukocytosis from catheter and CL which have now been removed; has been afebrile for >48 hrs and leukocytosis has resolved so will dc Rocephin  Right facial droop -non contrasted CT Head normal 8/4 - suspect this is her baseline facies   Cerebral palsy / Acute metabolic encephalopathy / dysphagia -has chronic dysphagia  -SLP has evaluated for several days and continued to recommend alternate route for diet/meds -placed feeding tube 8/5 and began feeds -SLP eval 8/10 with recommendation for pudding thick liquids at best but still rec PEG - d/w'd sister Rod Holler who wishes to pursue a PEG so ordered to be done by IR  -PEG pending-delayed today due to excessive stool in colon-will need abdominal binder post procedure  Hypernatremia with dehydration -Na improved / normalized   Hypokalemia -repleted per tube and now resolved  Symptomatic anemia -8/1 transfused 3 units PRBC - Hgb stable   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 [phone] Goals of care reviewed by MD on 8/2 at which time code status was changed to DNR - 8/6 Spoke at length with [name]- discussed need for temporary feeding tube and concerns over possible need for PEG - made aware that PEG  may only prolong her life in setting of progressive dementia but still desires to pursue PEG (8/10) Disposition Plan: ? Transfer to floor after PEG placed  Consultants: NA  Procedures:  8/4 CT head without contrast - No acute intracranial abnormality. Prominent atrophy.  Antibiotics: Zosyn  7/31>>8/03 Vancomycin 7/31>>8/03 Rocephin 8/03 >>8/07 Rocephin 8/11 >  DVT prophylaxis: SCD   Objective: VITAL SIGNS: Temp: 97 F (36.1 C) (08/13 9509) Temp src: Oral (08/13 0812) BP: 118/61 mmHg (08/13 0800) Pulse Rate: 91 (08/13 0812) SPO2; 100% on 2 L via [place] FIO2:  Intake/Output Summary (Last 24 hours) at 05/27/14 1000 Last data filed at 05/27/14 0602  Gross per 24 hour  Intake   2150 ml  Output      0 ml  Net   2150 ml   Exam: General: no respiratory distress - awake with eyes open - not agitated Lungs: Clear to auscultation bilaterally, RA Cardiovascular:  Regular rhythm without murmur gallop or rub  Abdomen: Nontender, nondistended, soft, bowel sounds positive, no rebound, no ascites, no appreciable mass Extremities: No significant cyanosis, clubbing - minimal pedal edema which appears chronic - mittens in place  Data Reviewed:  Basic Metabolic Panel:  Recent Labs Lab 05/22/14 0420 05/23/14 0300 05/24/14 0245 05/26/14 0359  NA 148* 145 142 145  K 4.9 4.5 4.9 4.4  CL 112 108 103 107  CO2 28 29 28 29   GLUCOSE 139* 147* 142* 155*  BUN 12 12 12 14   CREATININE 0.77 0.63 0.64 0.55  CALCIUM 7.8* 7.8* 8.0* 7.8*    Liver Function Tests:  Recent Labs Lab 05/23/14 0300  AST 19  ALT 11  ALKPHOS 148*  BILITOT 0.2*  PROT 6.1  ALBUMIN 1.3*   CBC:  Recent Labs Lab 05/22/14 0420 05/24/14 0245 05/25/14 0500 05/26/14 0359  WBC 12.1* 12.2* 11.8* 10.0  HGB 11.6* 11.5* 9.2* 9.5*  HCT 34.8* 36.5 29.4* 29.1*  MCV 90.9 95.3 95.1 91.5  PLT 41* 77* 85* 93*    Recent Results (from the past 240 hour(s))  URINE CULTURE     Status: None   Collection Time    05/25/14  9:47 AM      Result Value Ref Range Status   Specimen Description URINE, CATHETERIZED   Final   Special Requests NONE   Final   Culture  Setup Time     Final   Value: 05/25/2014 17:20     Performed at [facility]     Final   Value: NO GROWTH     Performed at  [facility]   Culture     Final   Value: NO GROWTH     Performed at [facility]   Report Status 05/26/2014 FINAL   Final  CULTURE, BLOOD (ROUTINE X 2)     Status: None   Collection Time    05/25/14 12:15 PM      Result Value Ref Range Status   Specimen Description BLOOD LEFT HAND   Final   Special Requests BOTTLES DRAWN AEROBIC AND ANAEROBIC 10CC   Final   Culture  Setup Time     Final   Value: 05/25/2014 16:18     Performed at [facility]   Culture     Final   Value:        BLOOD CULTURE RECEIVED NO GROWTH TO DATE CULTURE WILL BE HELD FOR 5 DAYS BEFORE ISSUING A FINAL NEGATIVE REPORT     Performed  at [facility]   Report Status PENDING   Incomplete  CULTURE, BLOOD (ROUTINE X 2)     Status: None   Collection Time    05/25/14 12:27 PM      Result Value Ref Range Status   Specimen Description BLOOD RIGHT HAND   Final   Special Requests BOTTLES DRAWN AEROBIC AND ANAEROBIC 10CC   Final   Culture  Setup Time     Final   Value: 05/25/2014 16:18     Performed at [facility]   Culture     Final   Value:        BLOOD CULTURE RECEIVED NO GROWTH TO DATE CULTURE WILL BE HELD FOR 5 DAYS BEFORE ISSUING A FINAL NEGATIVE REPORT     Performed at [facility]   Report Status PENDING   Incomplete  CATH TIP CULTURE     Status: None   Collection Time    05/25/14  3:30 PM      Result Value Ref Range Status   Specimen Description CATH TIP   Final   Special Requests LEFT SUBCLAVIAN   Final   Culture     Final   Value: NO GROWTH 1 DAY     Performed at [facility]   Report Status PENDING   Incomplete     Studies:  Recent x-ray studies have been reviewed in detail by the Attending Physician  Scheduled Meds:  Scheduled Meds: . antiseptic oral rinse  7 mL Mouth Rinse BID  . cefTRIAXone (ROCEPHIN)  IV  1 g Intravenous Q24H  . chlorhexidine  15 mL Mouth Rinse BID  . docusate  100 mg Per Tube BID  . feeding supplement (JEVITY 1.2  CAL)  1,000 mL Per Tube Q24H  . free water  200 mL Per Tube 4 times per day  . levETIRAcetam  500 mg Per Tube Q0600  . levETIRAcetam  750 mg Per Tube BID  . levothyroxine  75 mcg Per Tube QAC breakfast  . Valproic Acid  750 mg Per Tube TID    Time spent on care of this patient: 25 mins  Erin Hearing, ANP [facility] For Consults/Admissions - Flow Manager - [phone] Office  [phone] Pager [phone]  On-Call/Text Page:      [email or web address] [email or web address]      password [email or web address]  05/27/2014, 10:00 AM   LOS: 13 days   Examining patient and discussed assessment and plan with ANP Ebony Hail, agree with plan.  Patient with multiple complex medical problems spent > 40 minutes on direct patient care

## 2014-05-28 ENCOUNTER — Inpatient Hospital Stay (HOSPITAL_COMMUNITY): Payer: Medicare Other

## 2014-05-28 LAB — CATH TIP CULTURE: CULTURE: NO GROWTH

## 2014-05-28 MED ORDER — MAGNESIUM CITRATE PO SOLN: 1.0000 | Freq: Once | ORAL | Status: DC

## 2014-05-28 MED ORDER — FENTANYL CITRATE 0.05 MG/ML IJ SOLN
INTRAMUSCULAR | Status: AC
  Filled 2014-05-28: qty 2

## 2014-05-28 MED ORDER — FENTANYL CITRATE 0.05 MG/ML IJ SOLN
INTRAMUSCULAR | Status: AC | PRN
  Administered 2014-05-28: 50 ug via INTRAVENOUS

## 2014-05-28 MED ORDER — DEXTROSE-NACL 5-0.9 % IV SOLN
INTRAVENOUS | Status: DC
  Administered 2014-05-28: 75 mL/h via INTRAVENOUS
  Administered 2014-05-29: 1000 mL via INTRAVENOUS

## 2014-05-28 MED ORDER — LEVOTHYROXINE SODIUM 75 MCG PO TABS
75.0000 ug | ORAL_TABLET | Freq: Every day | ORAL | Status: DC
  Administered 2014-05-29 – 2014-06-02 (×5): 75 ug
  Filled 2014-05-28 (×7): qty 1

## 2014-05-28 MED ORDER — FENTANYL CITRATE 0.05 MG/ML IJ SOLN
INTRAMUSCULAR | Status: AC
  Administered 2014-05-28: 50 ug
  Filled 2014-05-28: qty 2

## 2014-05-28 MED ORDER — GLUCAGON HCL RDNA (DIAGNOSTIC) 1 MG IJ SOLR
INTRAMUSCULAR | Status: AC
  Filled 2014-05-28: qty 1

## 2014-05-28 MED ORDER — MIDAZOLAM HCL 2 MG/2ML IJ SOLN
INTRAMUSCULAR | Status: AC | PRN
  Administered 2014-05-28 (×2): 1 mg via INTRAVENOUS

## 2014-05-28 MED ORDER — LEVOTHYROXINE SODIUM 100 MCG IV SOLR
37.5000 ug | Freq: Once | INTRAVENOUS | Status: AC
  Administered 2014-05-28: 37.5 ug via INTRAVENOUS
  Filled 2014-05-28: qty 5

## 2014-05-28 MED ORDER — MORPHINE SULFATE 2 MG/ML IJ SOLN
0.5000 mg | Freq: Once | INTRAMUSCULAR | Status: AC
  Administered 2014-05-28: 0.5 mg via INTRAVENOUS
  Filled 2014-05-28: qty 1

## 2014-05-28 MED ORDER — DOCUSATE SODIUM 50 MG/5ML PO LIQD
200.0000 mg | Freq: Two times a day (BID) | ORAL | Status: DC
  Administered 2014-05-28 – 2014-05-31 (×7): 200 mg via ORAL
  Filled 2014-05-28 (×8): qty 20

## 2014-05-28 MED ORDER — MIDAZOLAM HCL 2 MG/2ML IJ SOLN
INTRAMUSCULAR | Status: AC
Start: 2014-05-28 — End: 2014-05-29
  Filled 2014-05-28: qty 4

## 2014-05-28 MED ORDER — LIDOCAINE HCL 1 % IJ SOLN
INTRAMUSCULAR | Status: AC
  Filled 2014-05-28: qty 20

## 2014-05-28 MED ORDER — IOHEXOL 300 MG/ML  SOLN
50.0000 mL | Freq: Once | INTRAMUSCULAR | Status: AC | PRN
  Administered 2014-05-28: 10 mL

## 2014-05-28 MED ORDER — LEVETIRACETAM IN NACL 500 MG/100ML IV SOLN
500.0000 mg | Freq: Once | INTRAVENOUS | Status: AC
  Administered 2014-05-28: 500 mg via INTRAVENOUS
  Filled 2014-05-28: qty 100

## 2014-05-28 MED ORDER — LEVOTHYROXINE SODIUM 100 MCG IV SOLR
75.0000 ug | Freq: Once | INTRAVENOUS | Status: DC
  Filled 2014-05-28: qty 5

## 2014-05-28 MED ORDER — LEVETIRACETAM 100 MG/ML PO SOLN
500.0000 mg | Freq: Every day | ORAL | Status: DC
  Administered 2014-05-29 – 2014-06-09 (×11): 500 mg
  Filled 2014-05-28 (×15): qty 5

## 2014-05-28 NOTE — Progress Notes (Signed)
[facility] ([facility]) requested additional information and this was sent to them today.  Awaiting probably level 2 screen. Patient had PEG inserted today. Will continue active SNF search but cannot place without [facility] number. Report will be left for weekend CSW re: above.  Lorie Phenix. Pauline Good, [place]

## 2014-05-28 NOTE — Progress Notes (Signed)
Upon assessment of patient, feeding tube out and patient still wearing safety mittens.  Patient is supposed to have percutaneous gastrostomy tube place today, so feeding tube is currently not in use.  Will continue to monitor patient.

## 2014-05-28 NOTE — Sedation Documentation (Signed)
2GM IV Ancef hung pre IR procedure as ordered. Med was accidentally charted as Given earlier but was not given.

## 2014-05-28 NOTE — Progress Notes (Signed)
[place] TEAM 1 - Stepdown/ICU TEAM Progress Note  Dawn [name] WUJ:[id number] DOB: [birth year] DOA: 05/14/2014 PCP: [name]  Admit HPI / Brief Narrative: 68 y.o. PMH of Seizures; diastolic CHF, atrial fibrillation, pituitary microadenoma (diagnosed 2009), hypothyroidism; High cholesterol and CAD. She also has developmental disability and at baseline non verbal and wheelchair bound status  She presented after staff at the group home noticed she had stopped eating for the past few days and seemed to be unable to swallow. No apparent fever or chills. She had been pocketing food in her mouth.  Had dental visit the previous week. Reportedly she had similar episodes of these symptoms in the past and recovered.   Upon arrival to emerge department patient was noted to be hypotensive (SBP down to 70s). She was given IV fluids and after 2 L blood pressure improved to her baseline of 110s. Sister was at the bedside so the admitting MD discussed goals of care. Family wished for her to be limited code. Patient appeared to have urinary tract infection leading to sepsis. Zosyn and vancomycin had been started by the EDP. There was also concern over possible aspiration although chest x-ray was unremarkable. She had been on mechanical soft diet for years with thin fluids but for several days prior to presentation she was switched to nectar thick due to concerns of active aspiration.dysphagia progression. For the 24 hours prior to presenatation she had stopped eating and drinking altogether. Per group home 2 weeks ago patient was treated for mild pneumonia.   HPI/Subjective: Non communicative as per baseline. Sleeping  Assessment/Plan:  Sepsis / septic shock due to pan sensitive E coli UTI -completed a 7 day abx course - blood cx negative x 5 days - sepsis physiology has resolved   Low grade fever / ? UTI -UA appeared c/w UTI  But culture no growth -blood cxs pending - cath tip negative -suspect  fevers and leukocytosis from catheter and CVC which have now been removed; has been afebrile for >48 hrs and leukocytosis has resolved so will dc Rocephin  Right facial droop -non contrasted CT Head normal 8/4 - suspect this is her baseline facies   Cerebral palsy / Acute metabolic encephalopathy / dysphagia -has chronic dysphagia  -SLP has evaluated for several days and continued to recommend alternate route for diet/meds -placed feeding tube 8/5 and began feeds -SLP eval 8/10 with recommendation for pudding thick liquids at best but still rec PEG - d/w'd sister Rod Holler who wishes to pursue a PEG so ordered to be done by IR  -PEG pending-delayed due to excessive stool in colon ut plan to proceed 8/14 -IVF until OK to use PEG -will need abdominal binder post procedure  Hypernatremia with dehydration -Na improved / normalized   Hypokalemia -repleted per tube and now resolved  Symptomatic anemia -8/1 transfused 3 units PRBC - Hgb stable   Thrombocytopenia -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 [phone] Goals of care reviewed by MD on 8/2 at which time code status was changed to DNR - 8/10 made aware that PEG may only prolong her life in setting of progressive dementia but still  desired to pursue PEG  Disposition Plan: ? Transfer to floor after PEG placed  Consultants: NA  Procedures:  8/4 CT head without contrast - No acute intracranial abnormality. Prominent atrophy.  Antibiotics: Zosyn 7/31>>8/03 Vancomycin 7/31>>8/03 Rocephin 8/03 >>8/07 Rocephin 8/11 >  DVT  prophylaxis: SCD   Objective: VITAL SIGNS: Temp: 97.9 F (36.6 C) (08/14 1145) Temp src: Oral (08/14 1145) BP: 109/91 mmHg (08/14 0800) Pulse Rate: 91 (08/14 0800) SPO2; 100% on 2 L via [place] FIO2:  Intake/Output Summary (Last 24 hours) at 05/28/14 1358 Last data filed at 05/28/14 0535  Gross per 24 hour  Intake    640 ml  Output      0 ml  Net    640 ml   Exam: General: no respiratory distress - sleeping Lungs: Clear to auscultation bilaterally, RA Cardiovascular:  Regular rhythm without gallop or rub- 2/6 systolic murmur RSB 2nd ICS  Abdomen: Nontender, nondistended, soft, bowel sounds positive, no rebound, no ascites, no appreciable mass Extremities: No significant cyanosis, clubbing - minimal pedal edema which appears chronic - mittens in place  Data Reviewed:  Basic Metabolic Panel:  Recent Labs Lab 05/22/14 0420 05/23/14 0300 05/24/14 0245 05/26/14 0359  NA 148* 145 142 145  K 4.9 4.5 4.9 4.4  CL 112 108 103 107  CO2 28 29 28 29   GLUCOSE 139* 147* 142* 155*  BUN 12 12 12 14   CREATININE 0.77 0.63 0.64 0.55  CALCIUM 7.8* 7.8* 8.0* 7.8*    Liver Function Tests:  Recent Labs Lab 05/23/14 0300  AST 19  ALT 11  ALKPHOS 148*  BILITOT 0.2*  PROT 6.1  ALBUMIN 1.3*   CBC:  Recent Labs Lab 05/22/14 0420 05/24/14 0245 05/25/14 0500 05/26/14 0359  WBC 12.1* 12.2* 11.8* 10.0  HGB 11.6* 11.5* 9.2* 9.5*  HCT 34.8* 36.5 29.4* 29.1*  MCV 90.9 95.3 95.1 91.5  PLT 41* 77* 85* 93*    Recent Results (from the past 240 hour(s))  URINE CULTURE     Status: None   Collection Time    05/25/14  9:47 AM      Result Value Ref Range Status   Specimen Description URINE, CATHETERIZED   Final   Special Requests NONE   Final   Culture  Setup Time     Final   Value: 05/25/2014 17:20     Performed at [place]     Final   Value: NO GROWTH     Performed at [facility]   Culture     Final   Value: NO GROWTH     Performed at  [facility]   Report Status 05/26/2014 FINAL   Final  CULTURE, BLOOD (ROUTINE X 2)     Status: None   Collection Time    05/25/14 12:15 PM      Result Value Ref Range Status   Specimen Description BLOOD LEFT HAND   Final   Special Requests BOTTLES DRAWN AEROBIC AND ANAEROBIC 10CC   Final   Culture  Setup Time     Final   Value: 05/25/2014 16:18     Performed at [facility]   Culture     Final   Value:        BLOOD CULTURE RECEIVED NO GROWTH TO DATE CULTURE WILL BE HELD FOR 5 DAYS BEFORE ISSUING A FINAL NEGATIVE REPORT     Performed at [facility]   Report Status PENDING  Incomplete  CULTURE, BLOOD (ROUTINE X 2)     Status: None   Collection Time    05/25/14 12:27 PM      Result Value Ref Range Status   Specimen Description BLOOD RIGHT HAND   Final   Special Requests BOTTLES DRAWN AEROBIC AND ANAEROBIC 10CC   Final   Culture  Setup Time     Final   Value: 05/25/2014 16:18     Performed at [facility]   Culture     Final   Value:        BLOOD CULTURE RECEIVED NO GROWTH TO DATE CULTURE WILL BE HELD FOR 5 DAYS BEFORE ISSUING A FINAL NEGATIVE REPORT     Performed at [facility]   Report Status PENDING   Incomplete  CATH TIP CULTURE     Status: None   Collection Time    05/25/14  3:30 PM      Result Value Ref Range Status   Specimen Description CATH TIP   Final   Special Requests LEFT SUBCLAVIAN   Final   Culture     Final   Value: NO GROWTH 2 DAYS     Performed at [facility]   Report Status 05/28/2014 FINAL   Final     Studies:  Recent x-ray studies have been reviewed in detail by the Attending Physician  Scheduled Meds:  Scheduled Meds: . antiseptic oral rinse  7 mL Mouth Rinse BID  . chlorhexidine  15 mL Mouth Rinse BID  . docusate  200 mg Oral BID  . feeding supplement (JEVITY 1.2 CAL)  1,000 mL Per Tube Q24H  . free water  200 mL Per Tube 4 times per day  . [START ON 05/29/2014] levETIRAcetam  500 mg Per Tube  Q0600  . levETIRAcetam  750 mg Per Tube BID  . [START ON 05/29/2014] levothyroxine  75 mcg Per Tube QAC breakfast  . Valproic Acid  750 mg Per Tube TID    Time spent on care of this patient: 25 mins  Erin Hearing, ANP [facility] For Consults/Admissions - Flow Manager - [phone] Office  [phone] Pager [phone]  On-Call/Text Page:      [email or web address]      password [facility]  05/28/2014, 1:58 PM   LOS: 14 days   Examining patient and discussed assessment and plan with ANP Ebony Hail and agree with the above plan.  Patient with multiple complex medical problems> 40 minutes spent in direct care

## 2014-05-28 NOTE — Procedures (Signed)
Procedure:  Gastrostomy 24 Fr bumper retention gastrostomy placed with tip in body of stomach.  OK to use for feeds tomorrow PM.

## 2014-05-28 NOTE — Sedation Documentation (Signed)
Successful Gtube placed 

## 2014-05-29 NOTE — Progress Notes (Signed)
[place] TEAM 1 - Stepdown/ICU TEAM Progress Note  Dawn [name] CBJ:[id number] DOB: 1946-06-26 DOA: 05/14/2014 PCP: [name]  Admit HPI / Brief Narrative: 68 y.o. BF PMHx of Seizures; diastolic CHF, atrial fibrillation, pituitary microadenoma (diagnosed 2009), hypothyroidism; High cholesterol and CAD. She also has developmental disability and at baseline non verbal and wheelchair bound status  She presented after staff at the group home noticed she had stopped eating for the past few days and seemed to be unable to swallow. No apparent fever or chills. She had been pocketing food in her mouth. Had dental visit the previous week. Reportedly she had similar episodes of these symptoms in the past and recovered.  Upon arrival to emerge department patient was noted to be hypotensive (SBP down to 70s). She was given IV fluids and after 2 L blood pressure improved to her baseline of 110s. Sister was at the bedside so the admitting MD discussed goals of care. Family wished for her to be limited code. Patient appeared to have urinary tract infection leading to sepsis. Zosyn and vancomycin had been started by the EDP. There was also concern over possible aspiration although chest x-ray was unremarkable. She had been on mechanical soft diet for years with thin fluids but for several days prior to presentation she was switched to nectar thick due to concerns of active aspiration.dysphagia progression. For the 24 hours prior to presenatation she had stopped eating and drinking altogether. Per group home 2 weeks ago patient was treated for mild pneumonia.    HPI/Subjective: Non communicative as per baseline. Eyes open   Assessment/Plan: Sepsis / septic shock due to pan sensitive E coli UTI  -completed a 7 day abx course  - blood cx negative x 5 days  - sepsis physiology has resolved   Low grade fever / ? UTI  -UA appeared c/w UTI But culture no growth  -blood cxs pending - cath tip negative    -suspect fevers and leukocytosis from catheter and CVC which have now been removed; has been afebrile for >48 hrs and leukocytosis has resolved so will dc Rocephin   Right facial droop  -non contrasted CT Head normal 8/4 - suspect this is her baseline facies   Cerebral palsy / Acute metabolic encephalopathy / dysphagia  -has chronic dysphagia  -SLP has evaluated for several days and continued to recommend alternate route for diet/meds  -placed feeding tube 8/5 and began feeds  -SLP eval 8/10 with recommendation for pudding thick liquids at best but still rec PEG - d/w'd sister Rod Holler who wishes to pursue a PEG so ordered to be done by IR  -PEG pending-delayed due to excessive stool in colon ut plan to proceed 8/14  -IVF until OK to use PEG  -Has abdominal binder post procedure   Hypernatremia with dehydration  -Na improved / normalized -Continue to monitor  Hypokalemia  -repleted per tube and now resolved  -Continue to monitor  Symptomatic anemia  -8/1 transfused 3 units PRBC - Hgb stable   Thrombocytopenia  -Stable, continue to monitor    Chronic Diastolic heart failure  -no evidence of volume overload at this time   Paroxysmal atrial fibrillation  -Currrently sinus   Dehydration  -resolved   Hypocalcemia  -Corrected calcium normal   Seizure disorder  -Continue Keppra and Depacon  -repeat drug levels therapeutic 8/8     Code Status: FULL Family Communication: no family present at time of exam Disposition Plan: SNF   Consultants: PA Olin Hauser  [place] (interventional radiology) Dr. Aletta Edouard (interventional radiology)  Procedure/Significant Events: 8/14 gastrostomy tube placed 90fr   Culture   Antibiotics:    DVT prophylaxis: SCDs   Devices Gastrostomy tube   LINES / TUBES:      Continuous Infusions: . dextrose 5 % and 0.9% NaCl 1,000 mL (05/29/14 0628)    Objective: VITAL SIGNS: Temp: 99.7 F (37.6 C) (08/15 1200) Temp src: Oral  (08/15 1200) BP: 60/38 mmHg (08/15 1200) Pulse Rate: 114 (08/15 1200) SPO2; 93% on room air FIO2:   Intake/Output Summary (Last 24 hours) at 05/29/14 1947 Last data filed at 05/29/14 1800  Gross per 24 hour  Intake 1895.42 ml  Output    350 ml  Net 1545.42 ml     Exam: General: no respiratory distress - sleeping  Lungs: Clear to auscultation bilaterally, RA  Cardiovascular: Regular rhythm without gallop or rub- 2/6 systolic murmur RSB 2nd ICS  Abdomen: Nontender, nondistended, soft, bowel sounds positive, no rebound, no ascites, no appreciable mass , gastrostomy tube present negative sign of infection or leak  Extremities: No significant cyanosis, clubbing - minimal pedal edema which appears chronic - mittens in place  Data Reviewed: Basic Metabolic Panel:  Recent Labs Lab 05/23/14 0300 05/24/14 0245 05/26/14 0359  NA 145 142 145  K 4.5 4.9 4.4  CL 108 103 107  CO2 29 28 29   GLUCOSE 147* 142* 155*  BUN 12 12 14   CREATININE 0.63 0.64 0.55  CALCIUM 7.8* 8.0* 7.8*   Liver Function Tests:  Recent Labs Lab 05/23/14 0300  AST 19  ALT 11  ALKPHOS 148*  BILITOT 0.2*  PROT 6.1  ALBUMIN 1.3*   No results found for this basename: LIPASE, AMYLASE,  in the last 168 hours No results found for this basename: AMMONIA,  in the last 168 hours CBC:  Recent Labs Lab 05/24/14 0245 05/25/14 0500 05/26/14 0359  WBC 12.2* 11.8* 10.0  HGB 11.5* 9.2* 9.5*  HCT 36.5 29.4* 29.1*  MCV 95.3 95.1 91.5  PLT 77* 85* 93*   Cardiac Enzymes: No results found for this basename: CKTOTAL, CKMB, CKMBINDEX, TROPONINI,  in the last 168 hours BNP (last 3 results)  Recent Labs  10/28/13 1547  PROBNP 488.2*   CBG:  Recent Labs Lab 05/27/14 1007 05/27/14 1235  GLUCAP 96 92    Recent Results (from the past 240 hour(s))  URINE CULTURE     Status: None   Collection Time    05/25/14  9:47 AM      Result Value Ref Range Status   Specimen Description URINE, CATHETERIZED   Final     Special Requests NONE   Final   Culture  Setup Time     Final   Value: 05/25/2014 17:20     Performed at [place]     Final   Value: NO GROWTH     Performed at [facility]   Culture     Final   Value: NO GROWTH     Performed at [facility]   Report Status 05/26/2014 FINAL   Final  CULTURE, BLOOD (ROUTINE X 2)     Status: None   Collection Time    05/25/14 12:15 PM      Result Value Ref Range Status   Specimen Description BLOOD LEFT HAND   Final   Special Requests BOTTLES DRAWN AEROBIC AND ANAEROBIC 10CC   Final   Culture  Setup Time  Final   Value: 05/25/2014 16:18     Performed at [facility]   Culture     Final   Value:        BLOOD CULTURE RECEIVED NO GROWTH TO DATE CULTURE WILL BE HELD FOR 5 DAYS BEFORE ISSUING A FINAL NEGATIVE REPORT     Performed at [facility]   Report Status PENDING   Incomplete  CULTURE, BLOOD (ROUTINE X 2)     Status: None   Collection Time    05/25/14 12:27 PM      Result Value Ref Range Status   Specimen Description BLOOD RIGHT HAND   Final   Special Requests BOTTLES DRAWN AEROBIC AND ANAEROBIC 10CC   Final   Culture  Setup Time     Final   Value: 05/25/2014 16:18     Performed at [facility]   Culture     Final   Value:        BLOOD CULTURE RECEIVED NO GROWTH TO DATE CULTURE WILL BE HELD FOR 5 DAYS BEFORE ISSUING A FINAL NEGATIVE REPORT     Performed at [facility]   Report Status PENDING   Incomplete  CATH TIP CULTURE     Status: None   Collection Time    05/25/14  3:30 PM      Result Value Ref Range Status   Specimen Description CATH TIP   Final   Special Requests LEFT SUBCLAVIAN   Final   Culture     Final   Value: NO GROWTH 2 DAYS     Performed at [facility]   Report Status 05/28/2014 FINAL   Final     Studies:  Recent x-ray studies have been reviewed in detail by the Attending Physician  Scheduled Meds:  Scheduled Meds: .  antiseptic oral rinse  7 mL Mouth Rinse BID  . chlorhexidine  15 mL Mouth Rinse BID  . docusate  200 mg Oral BID  . feeding supplement (JEVITY 1.2 CAL)  1,000 mL Per Tube Q24H  . free water  200 mL Per Tube 4 times per day  . levETIRAcetam  500 mg Per Tube Q0600  . levETIRAcetam  750 mg Per Tube BID  . levothyroxine  75 mcg Per Tube QAC breakfast  . Valproic Acid  750 mg Per Tube TID    Time spent on care of this patient: 40 mins   Allie Bossier , MD   [facility] Office  [phone] Pager - [phone]  On-Call/Text Page:      [email or web address]      password [email or web address]  If 7PM-7AM, please contact night-coverage [email or web address] Password [facility] 05/29/2014, 7:47 PM   LOS: 15 days

## 2014-05-30 ENCOUNTER — Inpatient Hospital Stay (HOSPITAL_COMMUNITY): Payer: Medicare Other

## 2014-05-30 LAB — COMPREHENSIVE METABOLIC PANEL
ALT: 11 U/L (ref 0–35)
ANION GAP: 10 (ref 5–15)
AST: 17 U/L (ref 0–37)
Albumin: 1.3 g/dL — ABNORMAL LOW (ref 3.5–5.2)
Alkaline Phosphatase: 258 U/L — ABNORMAL HIGH (ref 39–117)
BILIRUBIN TOTAL: 0.4 mg/dL (ref 0.3–1.2)
BUN: 6 mg/dL (ref 6–23)
CHLORIDE: 107 meq/L (ref 96–112)
CO2: 23 meq/L (ref 19–32)
CREATININE: 0.62 mg/dL (ref 0.50–1.10)
Calcium: 8 mg/dL — ABNORMAL LOW (ref 8.4–10.5)
GFR calc Af Amer: >90 mL/min (ref >90)
GLUCOSE: 163 mg/dL — AB (ref 70–99)
Potassium: 4 mEq/L (ref 3.7–5.3)
Sodium: 140 mEq/L (ref 137–147)
Total Protein: 6.4 g/dL (ref 6.0–8.3)

## 2014-05-30 LAB — CBC WITH DIFFERENTIAL/PLATELET
BASOS ABS: 0 10*3/uL (ref 0.0–0.1)
Basophils Relative: 0 % (ref 0–1)
Eosinophils Absolute: 0 10*3/uL (ref 0.0–0.7)
Eosinophils Relative: 0 % (ref 0–5)
HCT: 26 % — ABNORMAL LOW (ref 36.0–46.0)
Hemoglobin: 8.6 g/dL — ABNORMAL LOW (ref 12.0–15.0)
LYMPHS ABS: 2 10*3/uL (ref 0.7–4.0)
Lymphocytes Relative: 11 % — ABNORMAL LOW (ref 12–46)
MCH: 31.3 pg (ref 26.0–34.0)
MCHC: 33.1 g/dL (ref 30.0–36.0)
MCV: 94.5 fL (ref 78.0–100.0)
MONOS PCT: 22 % — AB (ref 3–12)
Monocytes Absolute: 4.1 10*3/uL — ABNORMAL HIGH (ref 0.1–1.0)
Neutro Abs: 12.4 10*3/uL — ABNORMAL HIGH (ref 1.7–7.7)
Neutrophils Relative %: 67 % (ref 43–77)
PLATELETS: 257 10*3/uL (ref 150–400)
RBC: 2.75 MIL/uL — ABNORMAL LOW (ref 3.87–5.11)
RDW: 16.6 % — AB (ref 11.5–15.5)
WBC: 18.5 10*3/uL — AB (ref 4.0–10.5)

## 2014-05-30 LAB — URINALYSIS, ROUTINE W REFLEX MICROSCOPIC
Bilirubin Urine: NEGATIVE
GLUCOSE, UA: NEGATIVE mg/dL
Ketones, ur: NEGATIVE mg/dL
Nitrite: NEGATIVE
PH: 5.5 (ref 5.0–8.0)
Protein, ur: NEGATIVE mg/dL
SPECIFIC GRAVITY, URINE: 1.01 (ref 1.005–1.030)
Urobilinogen, UA: 1 mg/dL (ref 0.0–1.0)

## 2014-05-30 LAB — URINE MICROSCOPIC-ADD ON

## 2014-05-30 MED ORDER — SODIUM CHLORIDE 0.9 % IV BOLUS (SEPSIS)
1000.0000 mL | Freq: Once | INTRAVENOUS | Status: AC
  Administered 2014-05-30: 1000 mL via INTRAVENOUS

## 2014-05-30 MED ORDER — SODIUM CHLORIDE 0.9 % IV SOLN
INTRAVENOUS | Status: DC
  Administered 2014-05-30: 1000 mL via INTRAVENOUS
  Administered 2014-05-31 – 2014-06-02 (×3): via INTRAVENOUS

## 2014-05-30 NOTE — Progress Notes (Signed)
TEAM 1 - Stepdown/ICU TEAM Progress Note  ANBERLYN FEIMSTER MBW:[id number] DOB: May 10, 1946 DOA: 05/14/2014 PCP: [name]  Admit HPI / Brief Narrative: 68 y.o. BF PMHx of Seizures; diastolic CHF, atrial fibrillation, pituitary microadenoma (diagnosed 2009), hypothyroidism; High cholesterol and CAD. She also has developmental disability and at baseline non verbal and wheelchair bound status  She presented after staff at the group home noticed she had stopped eating for the past few days and seemed to be unable to swallow. No apparent fever or chills. She had been pocketing food in her mouth. Had dental visit the previous week. Reportedly she had similar episodes of these symptoms in the past and recovered.  Upon arrival to emerge department patient was noted to be hypotensive (SBP down to 70s). She was given IV fluids and after 2 L blood pressure improved to her baseline of 110s. Sister was at the bedside so the admitting MD discussed goals of care. Family wished for her to be limited code. Patient appeared to have urinary tract infection leading to sepsis. Zosyn and vancomycin had been started by the EDP. There was also concern over possible aspiration although chest x-ray was unremarkable. She had been on mechanical soft diet for years with thin fluids but for several days prior to presentation she was switched to nectar thick due to concerns of active aspiration.dysphagia progression. For the 24 hours prior to presenatation she had stopped eating and drinking altogether. Per group home 2 weeks ago patient was treated for mild pneumonia.    HPI/Subjective: Non communicative as per baseline. Eyes open   Assessment/Plan: Sepsis / septic shock due to pan sensitive E coli UTI  -completed a 7 day abx course  - blood cx negative x 5 days  - sepsis physiology has resolved   Low grade fever / ? UTI  -UA appeared c/w UTI But culture no growth  -blood cxs pending - cath tip negative    -suspect fevers and leukocytosis from catheter and CVC which have now been removed; has been afebrile for >48 hrs and leukocytosis has resolved so will dc Rocephin   Right facial droop  -non contrasted CT Head normal 8/4 - suspect this is her baseline facies   Cerebral palsy / Acute metabolic encephalopathy / dysphagia  -has chronic dysphagia  -SLP has evaluated for several days and continued to recommend alternate route for diet/meds  -placed feeding tube 8/5 and began feeds  -SLP eval 8/10 with recommendation for pudding thick liquids at best but still rec PEG - d/w'd sister Rod Holler who wishes to pursue a PEG so ordered to be done by IR  -PEG pending-delayed due to excessive stool in colon ut plan to proceed 8/14  -IVF until OK to use PEG  -Has abdominal binder post procedure   Hypernatremia with dehydration  -Na improved / normalized -Continue to monitor  Hypokalemia  -repleted per tube and now resolved  -Continue to monitor  Symptomatic anemia  -8/1 transfused 3 units PRBC - Hgb stable   Thrombocytopenia  -Stable, continue to monitor    Chronic Diastolic heart failure  -no evidence of volume overload at this time   Paroxysmal atrial fibrillation  -Currrently sinus   Dehydration  -resolved   Hypocalcemia  -Corrected calcium normal   Seizure disorder  -Continue Keppra and Depacon  -repeat drug levels therapeutic 8/8     Code Status: FULL Family Communication: no family present at time of exam Disposition Plan: SNF   Consultants: PA Olin Hauser  [place] (interventional radiology) Dr. Aletta Edouard (interventional radiology)  Procedure/Significant Events: 8/14 gastrostomy tube placed 48fr   Culture   Antibiotics:    DVT prophylaxis: SCDs   Devices Gastrostomy tube   LINES / TUBES:      Continuous Infusions: . dextrose 5 % and 0.9% NaCl 1,000 mL (05/29/14 0628)    Objective: VITAL SIGNS: Temp: 100 F (37.8 C) (08/16 0851) Temp src:  Axillary (08/16 0851) BP: 94/47 mmHg (08/16 0851) Pulse Rate: 106 (08/16 0851) SPO2; 93% on room air FIO2:   Intake/Output Summary (Last 24 hours) at 05/30/14 1049 Last data filed at 05/30/14 0800  Gross per 24 hour  Intake 2740.42 ml  Output      0 ml  Net 2740.42 ml     Exam: General: no respiratory distress - sleeping  Lungs: Clear to auscultation bilaterally, RA  Cardiovascular: Regular rhythm without gallop or rub- 2/6 systolic murmur RSB 2nd ICS  Abdomen: Nontender, nondistended, soft, bowel sounds positive, no rebound, no ascites, no appreciable mass , gastrostomy tube present negative sign of infection or leak  Extremities: No significant cyanosis, clubbing - minimal pedal edema which appears chronic - mittens in place  Data Reviewed: Basic Metabolic Panel:  Recent Labs Lab 05/24/14 0245 05/26/14 0359 05/30/14 0409  NA 142 145 140  K 4.9 4.4 4.0  CL 103 107 107  CO2 28 29 23   GLUCOSE 142* 155* 163*  BUN 12 14 6   CREATININE 0.64 0.55 0.62  CALCIUM 8.0* 7.8* 8.0*   Liver Function Tests:  Recent Labs Lab 05/30/14 0409  AST 17  ALT 11  ALKPHOS 258*  BILITOT 0.4  PROT 6.4  ALBUMIN 1.3*   No results found for this basename: LIPASE, AMYLASE,  in the last 168 hours No results found for this basename: AMMONIA,  in the last 168 hours CBC:  Recent Labs Lab 05/24/14 0245 05/25/14 0500 05/26/14 0359 05/30/14 0409  WBC 12.2* 11.8* 10.0 18.5*  NEUTROABS  --   --   --  12.4*  HGB 11.5* 9.2* 9.5* 8.6*  HCT 36.5 29.4* 29.1* 26.0*  MCV 95.3 95.1 91.5 94.5  PLT 77* 85* 93* 257   Cardiac Enzymes: No results found for this basename: CKTOTAL, CKMB, CKMBINDEX, TROPONINI,  in the last 168 hours BNP (last 3 results)  Recent Labs  10/28/13 1547  PROBNP 488.2*   CBG:  Recent Labs Lab 05/27/14 1007 05/27/14 1235  GLUCAP 96 92    Recent Results (from the past 240 hour(s))  URINE CULTURE     Status: None   Collection Time    05/25/14  9:47 AM       Result Value Ref Range Status   Specimen Description URINE, CATHETERIZED   Final   Special Requests NONE   Final   Culture  Setup Time     Final   Value: 05/25/2014 17:20     Performed at [facility]     Final   Value: NO GROWTH     Performed at [facility]   Culture     Final   Value: NO GROWTH     Performed at [facility]   Report Status 05/26/2014 FINAL   Final  CULTURE, BLOOD (ROUTINE X 2)     Status: None   Collection Time    05/25/14 12:15 PM      Result Value Ref Range Status   Specimen Description BLOOD LEFT HAND   Final  Special Requests BOTTLES DRAWN AEROBIC AND ANAEROBIC 10CC   Final   Culture  Setup Time     Final   Value: 05/25/2014 16:18     Performed at [facility]   Culture     Final   Value:        BLOOD CULTURE RECEIVED NO GROWTH TO DATE CULTURE WILL BE HELD FOR 5 DAYS BEFORE ISSUING A FINAL NEGATIVE REPORT     Performed at [facility]   Report Status PENDING   Incomplete  CULTURE, BLOOD (ROUTINE X 2)     Status: None   Collection Time    05/25/14 12:27 PM      Result Value Ref Range Status   Specimen Description BLOOD RIGHT HAND   Final   Special Requests BOTTLES DRAWN AEROBIC AND ANAEROBIC 10CC   Final   Culture  Setup Time     Final   Value: 05/25/2014 16:18     Performed at [facility]   Culture     Final   Value:        BLOOD CULTURE RECEIVED NO GROWTH TO DATE CULTURE WILL BE HELD FOR 5 DAYS BEFORE ISSUING A FINAL NEGATIVE REPORT     Performed at [facility]   Report Status PENDING   Incomplete  CATH TIP CULTURE     Status: None   Collection Time    05/25/14  3:30 PM      Result Value Ref Range Status   Specimen Description CATH TIP   Final   Special Requests LEFT SUBCLAVIAN   Final   Culture     Final   Value: NO GROWTH 2 DAYS     Performed at [facility]   Report Status 05/28/2014 FINAL   Final     Studies:  Recent x-ray studies have been reviewed in  detail by the Attending Physician  Scheduled Meds:  Scheduled Meds: . antiseptic oral rinse  7 mL Mouth Rinse BID  . chlorhexidine  15 mL Mouth Rinse BID  . docusate  200 mg Oral BID  . feeding supplement (JEVITY 1.2 CAL)  1,000 mL Per Tube Q24H  . free water  200 mL Per Tube 4 times per day  . levETIRAcetam  500 mg Per Tube Q0600  . levETIRAcetam  750 mg Per Tube BID  . levothyroxine  75 mcg Per Tube QAC breakfast  . Valproic Acid  750 mg Per Tube TID    Time spent on care of this patient: 40 mins   Allie Bossier , MD   [facility] Office  [phone] Pager - [phone]  On-Call/Text Page:      [email or web address]      password [email or web address]  If 7PM-7AM, please contact night-coverage [email or web address] Password [email or web address] 05/30/2014, 10:49 AM   LOS: 16 days

## 2014-05-30 NOTE — Progress Notes (Signed)
[facility] TEAM 1 - Stepdown/ICU TEAM Progress Note  Dawn [name] YQM:[id number] DOB: Aug 12, 1946 DOA: 05/14/2014 PCP: [name]  Admit HPI / Brief Narrative: 68 y.o. BF PMHx of Seizures; diastolic CHF, atrial fibrillation, pituitary microadenoma (diagnosed 2009), hypothyroidism; High cholesterol and CAD. She also has developmental disability and at baseline non verbal and wheelchair bound status  She presented after staff at the group home noticed she had stopped eating for the past few days and seemed to be unable to swallow. No apparent fever or chills. She had been pocketing food in her mouth. Had dental visit the previous week. Reportedly she had similar episodes of these symptoms in the past and recovered.  Upon arrival to emerge department patient was noted to be hypotensive (SBP down to 70s). She was given IV fluids and after 2 L blood pressure improved to her baseline of 110s. Sister was at the bedside so the admitting MD discussed goals of care. Family wished for her to be limited code. Patient appeared to have urinary tract infection leading to sepsis. Zosyn and vancomycin had been started by the EDP. There was also concern over possible aspiration although chest x-ray was unremarkable. She had been on mechanical soft diet for years with thin fluids but for several days prior to presentation she was switched to nectar thick due to concerns of active aspiration.dysphagia progression. For the 24 hours prior to presenatation she had stopped eating and drinking altogether. Per group home 2 weeks ago patient was treated for mild pneumonia.    HPI/Subjective: 8/16 Non communicative as per baseline. Eyes open   Assessment/Plan: Sepsis / septic shock due to pan sensitive E coli UTI  -completed a 7 day abx course  - 8/16 overnight patient became hypotensive, spiked a fever Tmax= 38.5, became tachycardic  -Bolus 1L NS -Continue normal saline at 162ml/hr -Obtain blood culture  x2 -Obtain urine culture -Obtain PCXR -Hold off on starting antibiotics unless patient becomes unstable; what are we treating?  Low grade fever / ? UTI  -8/16 Patient spiked fever overnight Tmax= 38.5,, increase in white blood cells to 18.5. And hypotensive Patient becoming septic? -Panculture, PCXR -   Right facial droop  -non contrasted CT Head normal 8/4 - suspect this is her baseline facies   Cerebral palsy / Acute metabolic encephalopathy / dysphagia  -has chronic dysphagia  -Has abdominal binder post PEG procedure   Hypernatremia with dehydration  -Na improved / normalized -Continue to monitor  Hypokalemia  -repleted per tube and now resolved  -Continue to monitor  Symptomatic anemia  -8/1 transfused 3 units PRBC - Hgb stable   Thrombocytopenia  -Stable, continue to monitor    Chronic Diastolic heart failure  -no evidence of volume overload at this time   Paroxysmal atrial fibrillation  -Currrently sinus tachycardia   Dehydration  -resolved   Hypocalcemia  -Corrected calcium normal   Seizure disorder  -Continue Keppra and Depacon  -repeat drug levels therapeutic 8/8     Code Status: FULL Family Communication: no family present at time of exam Disposition Plan: SNF   Consultants: PA Monia Sabal (interventional radiology) Dr. Aletta Edouard (interventional radiology)  Procedure/Significant Events: 8/14 gastrostomy tube placed 37fr 8/16 PCXR;Persistent infiltrate in LEFT lower lobe with improved aeration at  RIGHT base.      Culture -8/16 urine pending -8/16 blood pending    Antibiotics: Zosyn 7/31>> stopped 8/03  Vancomycin 7/31>> stopped 8/03  Rocephin 8/03 >> stopped 8/07  Rocephin 8/11 > stopped 8/13  DVT prophylaxis: SCDs   Devices Gastrostomy tube   LINES / TUBES:  8/15 22ga left hand    Continuous Infusions: . dextrose 5 % and 0.9% NaCl 1,000 mL (05/29/14 0628)    Objective: VITAL SIGNS: Temp: 100 F (37.8  C) (08/16 0851) Temp src: Axillary (08/16 0851) BP: 94/47 mmHg (08/16 0851) Pulse Rate: 106 (08/16 0851) SPO2; 97% on room air FIO2:   Intake/Output Summary (Last 24 hours) at 05/30/14 1101 Last data filed at 05/30/14 0800  Gross per 24 hour  Intake 2740.42 ml  Output      0 ml  Net 2740.42 ml     Exam: General: Awake, noncommunicative, no respiratory distress   Lungs: Clear to auscultation bilaterally, RA  Cardiovascular: Tachycardic, Regular rhythm without gallop or rub- 2/6 systolic murmur RSB 2nd ICS  Abdomen: Nontender, nondistended, soft, bowel sounds positive, no rebound, no ascites, no appreciable mass , gastrostomy tube present negative sign of infection or leak  Extremities: No significant cyanosis, clubbing - minimal pedal edema which appears chronic - mittens in place  Data Reviewed: Basic Metabolic Panel:  Recent Labs Lab 05/24/14 0245 05/26/14 0359 05/30/14 0409  NA 142 145 140  K 4.9 4.4 4.0  CL 103 107 107  CO2 28 29 23   GLUCOSE 142* 155* 163*  BUN 12 14 6   CREATININE 0.64 0.55 0.62  CALCIUM 8.0* 7.8* 8.0*   Liver Function Tests:  Recent Labs Lab 05/30/14 0409  AST 17  ALT 11  ALKPHOS 258*  BILITOT 0.4  PROT 6.4  ALBUMIN 1.3*   No results found for this basename: LIPASE, AMYLASE,  in the last 168 hours No results found for this basename: AMMONIA,  in the last 168 hours CBC:  Recent Labs Lab 05/24/14 0245 05/25/14 0500 05/26/14 0359 05/30/14 0409  WBC 12.2* 11.8* 10.0 18.5*  NEUTROABS  --   --   --  12.4*  HGB 11.5* 9.2* 9.5* 8.6*  HCT 36.5 29.4* 29.1* 26.0*  MCV 95.3 95.1 91.5 94.5  PLT 77* 85* 93* 257   Cardiac Enzymes: No results found for this basename: CKTOTAL, CKMB, CKMBINDEX, TROPONINI,  in the last 168 hours BNP (last 3 results)  Recent Labs  10/28/13 1547  PROBNP 488.2*   CBG:  Recent Labs Lab 05/27/14 1007 05/27/14 1235  GLUCAP 96 92    Recent Results (from the past 240 hour(s))  URINE CULTURE      Status: None   Collection Time    05/25/14  9:47 AM      Result Value Ref Range Status   Specimen Description URINE, CATHETERIZED   Final   Special Requests NONE   Final   Culture  Setup Time     Final   Value: 05/25/2014 17:20     Performed at [facility]     Final   Value: NO GROWTH     Performed at [facility]   Culture     Final   Value: NO GROWTH     Performed at [facility]   Report Status 05/26/2014 FINAL   Final  CULTURE, BLOOD (ROUTINE X 2)     Status: None   Collection Time    05/25/14 12:15 PM      Result Value Ref Range Status   Specimen Description BLOOD LEFT HAND   Final   Special Requests BOTTLES DRAWN AEROBIC AND ANAEROBIC 10CC   Final   Culture  Setup Time  Final   Value: 05/25/2014 16:18     Performed at [facility]   Culture     Final   Value:        BLOOD CULTURE RECEIVED NO GROWTH TO DATE CULTURE WILL BE HELD FOR 5 DAYS BEFORE ISSUING A FINAL NEGATIVE REPORT     Performed at [facility]   Report Status PENDING   Incomplete  CULTURE, BLOOD (ROUTINE X 2)     Status: None   Collection Time    05/25/14 12:27 PM      Result Value Ref Range Status   Specimen Description BLOOD RIGHT HAND   Final   Special Requests BOTTLES DRAWN AEROBIC AND ANAEROBIC 10CC   Final   Culture  Setup Time     Final   Value: 05/25/2014 16:18     Performed at [facility]   Culture     Final   Value:        BLOOD CULTURE RECEIVED NO GROWTH TO DATE CULTURE WILL BE HELD FOR 5 DAYS BEFORE ISSUING A FINAL NEGATIVE REPORT     Performed at [facility]   Report Status PENDING   Incomplete  CATH TIP CULTURE     Status: None   Collection Time    05/25/14  3:30 PM      Result Value Ref Range Status   Specimen Description CATH TIP   Final   Special Requests LEFT SUBCLAVIAN   Final   Culture     Final   Value: NO GROWTH 2 DAYS     Performed at [facility]   Report Status 05/28/2014 FINAL   Final       Studies:  Recent x-ray studies have been reviewed in detail by the Attending Physician  Scheduled Meds:  Scheduled Meds: . antiseptic oral rinse  7 mL Mouth Rinse BID  . chlorhexidine  15 mL Mouth Rinse BID  . docusate  200 mg Oral BID  . feeding supplement (JEVITY 1.2 CAL)  1,000 mL Per Tube Q24H  . free water  200 mL Per Tube 4 times per day  . levETIRAcetam  500 mg Per Tube Q0600  . levETIRAcetam  750 mg Per Tube BID  . levothyroxine  75 mcg Per Tube QAC breakfast  . Valproic Acid  750 mg Per Tube TID    Time spent on care of this patient: 40 mins   Allie Bossier , MD   [facility] Office  [phone] Pager - [phone]  On-Call/Text Page:      [email or web address]      password [email or web address]  If 7PM-7AM, please contact night-coverage [email or web address] Password [email or web address] 05/30/2014, 11:01 AM   LOS: 16 days

## 2014-05-31 LAB — CBC WITH DIFFERENTIAL/PLATELET
Basophils Absolute: 0 10*3/uL (ref 0.0–0.1)
Basophils Relative: 0 % (ref 0–1)
Eosinophils Absolute: 0 10*3/uL (ref 0.0–0.7)
Eosinophils Relative: 0 % (ref 0–5)
HEMATOCRIT: 23.1 % — AB (ref 36.0–46.0)
HEMOGLOBIN: 7.4 g/dL — AB (ref 12.0–15.0)
LYMPHS ABS: 3.2 10*3/uL (ref 0.7–4.0)
Lymphocytes Relative: 17 % (ref 12–46)
MCH: 29.6 pg (ref 26.0–34.0)
MCHC: 32 g/dL (ref 30.0–36.0)
MCV: 92.4 fL (ref 78.0–100.0)
MONOS PCT: 17 % — AB (ref 3–12)
Monocytes Absolute: 3.2 10*3/uL — ABNORMAL HIGH (ref 0.1–1.0)
NEUTROS ABS: 12.9 10*3/uL — AB (ref 1.7–7.7)
Neutrophils Relative %: 66 % (ref 43–77)
Platelets: 244 10*3/uL (ref 150–400)
RBC: 2.5 MIL/uL — ABNORMAL LOW (ref 3.87–5.11)
RDW: 16.9 % — ABNORMAL HIGH (ref 11.5–15.5)
WBC: 19.3 10*3/uL — ABNORMAL HIGH (ref 4.0–10.5)

## 2014-05-31 LAB — CULTURE, BLOOD (ROUTINE X 2)
Culture: NO GROWTH
Culture: NO GROWTH

## 2014-05-31 LAB — COMPREHENSIVE METABOLIC PANEL
ALBUMIN: 1.2 g/dL — AB (ref 3.5–5.2)
ALT: 9 U/L (ref 0–35)
ANION GAP: 10 (ref 5–15)
AST: 13 U/L (ref 0–37)
Alkaline Phosphatase: 200 U/L — ABNORMAL HIGH (ref 39–117)
BUN: 7 mg/dL (ref 6–23)
CO2: 22 mEq/L (ref 19–32)
Calcium: 7.7 mg/dL — ABNORMAL LOW (ref 8.4–10.5)
Chloride: 109 mEq/L (ref 96–112)
Creatinine, Ser: 0.58 mg/dL (ref 0.50–1.10)
GFR calc Af Amer: >90 mL/min (ref >90)
GFR calc non Af Amer: >90 mL/min (ref >90)
Glucose, Bld: 128 mg/dL — ABNORMAL HIGH (ref 70–99)
POTASSIUM: 3.9 meq/L (ref 3.7–5.3)
SODIUM: 141 meq/L (ref 137–147)
TOTAL PROTEIN: 6.1 g/dL (ref 6.0–8.3)
Total Bilirubin: 0.3 mg/dL (ref 0.3–1.2)

## 2014-05-31 MED ORDER — ACETAMINOPHEN 160 MG/5ML PO SOLN
650.0000 mg | Freq: Four times a day (QID) | ORAL | Status: DC | PRN
  Administered 2014-05-31 – 2014-06-04 (×2): 650 mg
  Filled 2014-05-31 (×3): qty 20.3

## 2014-05-31 MED ORDER — DOCUSATE SODIUM 50 MG/5ML PO LIQD
200.0000 mg | Freq: Two times a day (BID) | ORAL | Status: DC
  Administered 2014-05-31 – 2014-06-09 (×18): 200 mg
  Filled 2014-05-31 (×20): qty 20

## 2014-05-31 NOTE — Progress Notes (Signed)
[place] TEAM 1 - Stepdown/ICU TEAM Progress Note  EARLY ORD HUT:[id number] DOB: 1946/01/01 DOA: 05/14/2014 PCP: [name]  Admit HPI / Brief Narrative: 68 y.o. F Hx of Seizures; diastolic CHF, atrial fibrillation, pituitary microadenoma (diagnosed 2009), hypothyroidism; High cholesterol and CAD. She also has developmental disability and at baseline non verbal and wheelchair bound status   She presented after staff at the group home noticed she had stopped eating for the past few days and seemed to be unable to swallow. No apparent fever or chills. She had been pocketing food in her mouth. Had dental visit the previous week. Reportedly she had similar episodes of these symptoms in the past and recovered.   Upon arrival to emerge department patient was noted to be hypotensive (SBP down to 70s). She was given IV fluids and after 2 L blood pressure improved to her baseline of 110s. Sister was at the bedside so the admitting MD discussed goals of care. Family wished for her to be limited code. Patient appeared to have urinary tract infection leading to sepsis. Zosyn and vancomycin had been started by the EDP. There was also concern over possible aspiration although chest x-ray was unremarkable. She had been on mechanical soft diet for years with thin fluids but for several days prior to presentation she was switched to nectar thick due to concerns of active aspiration.dysphagia progression. For the 24 hours prior to presenatation she had stopped eating and drinking altogether. Per group home 2 weeks ago patient was treated for mild pneumonia.    HPI/Subjective: Non verbal as per baseline. Not in any apparent distress.  Assessment/Plan:  Sepsis / septic shock due to pan sensitive E coli UTI  -completed a 7 day abx course  - 8/16 overnight patient became hypotensive, spiked a fever and became tachycardic so was bolused 1L NS and normal saline at 114ml/hr -FU blood and urine cx's  remain negative - WBCs continue to trend up -PCXR persistent LLL infiltrate and improved aeration RLL -Hold off on starting antibiotics unless patient becomes unstable as we are unsure what we would be treating    Right facial droop  -non contrasted CT Head normal 8/4 - suspect this is her baseline facies   Cerebral palsy / Acute metabolic encephalopathy / dysphagia  -has chronic dysphagia  -Has abdominal binder post PEG procedure   Hypernatremia with dehydration  -Na improved / normalized -Continue to monitor  Hypokalemia  -repleted per tube and now resolved  -Continue to monitor  Symptomatic anemia  -8/1 transfused 3 units PRBC - Hgb drifting down but not obvious gross blood loss - follow trend   Thrombocytopenia  -Stable, continue to monitor    Chronic Diastolic heart failure  -no evidence of volume overload at this time   Paroxysmal atrial fibrillation  -Currrently sinus tachycardia   Dehydration  -resolved   Hypocalcemia  -Corrected calcium normal   Seizure disorder  -Continue Keppra and Depacon  -repeat drug levels therapeutic 8/8  Code Status: FULL Family Communication: no family present at time of exam Disposition Plan: SNF  Consultants: Dr. Aletta Edouard (interventional radiology)  Procedure/Significant Events: 8/14 gastrostomy tube placed 49fr 8/16 PCXR;Persistent infiltrate in LEFT lower lobe with improved aeration at  RIGHT base.   Antibiotics: Zosyn 7/31>> stopped 8/03  Vancomycin 7/31>> stopped 8/03  Rocephin 8/03 >> stopped 8/07  Rocephin 8/11 > stopped 8/13  DVT prophylaxis: SCDs  Devices Gastrostomy tube   Objective: VITAL SIGNS: Temp: 97.7 F (36.5 C) (08/17 0728)  Temp src: Axillary (08/17 0728) BP: 105/53 mmHg (08/17 0728) Pulse Rate: 106 (08/17 0728) SPO2; 97% on room air FIO2:  Intake/Output Summary (Last 24 hours) at 05/31/14 1109 Last data filed at 05/31/14 0700  Gross per 24 hour  Intake   2975 ml  Output    105 ml   Net   2870 ml   Exam: General: Awake, non communicative/non verbal, no respiratory distress   Lungs: Clear to auscultation bilaterally Cardiovascular: Regular rhythm without gallop or rub- 2/6 systolic murmur Abdomen: Nontender, nondistended, soft, bowel sounds positive, no rebound, no ascites, no appreciable mass, gastrostomy tube site unremarkable Extremities: No significant cyanosis, clubbing - minimal pedal edema which appears chronic - mittens in place  Data Reviewed: Basic Metabolic Panel:  Recent Labs Lab 05/26/14 0359 05/30/14 0409 05/31/14 0520  NA 145 140 141  K 4.4 4.0 3.9  CL 107 107 109  CO2 29 23 22   GLUCOSE 155* 163* 128*  BUN 14 6 7   CREATININE 0.55 0.62 0.58  CALCIUM 7.8* 8.0* 7.7*   Liver Function Tests:  Recent Labs Lab 05/30/14 0409 05/31/14 0520  AST 17 13  ALT 11 9  ALKPHOS 258* 200*  BILITOT 0.4 0.3  PROT 6.4 6.1  ALBUMIN 1.3* 1.2*   CBC:  Recent Labs Lab 05/25/14 0500 05/26/14 0359 05/30/14 0409 05/31/14 0520  WBC 11.8* 10.0 18.5* 19.3*  NEUTROABS  --   --  12.4* 12.9*  HGB 9.2* 9.5* 8.6* 7.4*  HCT 29.4* 29.1* 26.0* 23.1*  MCV 95.1 91.5 94.5 92.4  PLT 85* 93* 257 244   CBG:  Recent Labs Lab 05/27/14 1007 05/27/14 1235  GLUCAP 96 92    Recent Results (from the past 240 hour(s))  URINE CULTURE     Status: None   Collection Time    05/25/14  9:47 AM      Result Value Ref Range Status   Specimen Description URINE, CATHETERIZED   Final   Special Requests NONE   Final   Culture  Setup Time     Final   Value: 05/25/2014 17:20     Performed at [facility]     Final   Value: NO GROWTH     Performed at [facility]   Culture     Final   Value: NO GROWTH     Performed at [facility]   Report Status 05/26/2014 FINAL   Final  CULTURE, BLOOD (ROUTINE X 2)     Status: None   Collection Time    05/25/14 12:15 PM      Result Value Ref Range Status   Specimen Description BLOOD LEFT  HAND   Final   Special Requests BOTTLES DRAWN AEROBIC AND ANAEROBIC 10CC   Final   Culture  Setup Time     Final   Value: 05/25/2014 16:18     Performed at [facility]   Culture     Final   Value: NO GROWTH 5 DAYS     Performed at [facility]   Report Status 05/31/2014 FINAL   Final  CULTURE, BLOOD (ROUTINE X 2)     Status: None   Collection Time    05/25/14 12:27 PM      Result Value Ref Range Status   Specimen Description BLOOD RIGHT HAND   Final   Special Requests BOTTLES DRAWN AEROBIC AND ANAEROBIC 10CC   Final   Culture  Setup Time  Final   Value: 05/25/2014 16:18     Performed at [facility]   Culture     Final   Value: NO GROWTH 5 DAYS     Performed at [facility]   Report Status 05/31/2014 FINAL   Final  CATH TIP CULTURE     Status: None   Collection Time    05/25/14  3:30 PM      Result Value Ref Range Status   Specimen Description CATH TIP   Final   Special Requests LEFT SUBCLAVIAN   Final   Culture     Final   Value: NO GROWTH 2 DAYS     Performed at [facility]   Report Status 05/28/2014 FINAL   Final  CULTURE, BLOOD (ROUTINE X 2)     Status: None   Collection Time    05/30/14 12:00 PM      Result Value Ref Range Status   Specimen Description BLOOD RIGHT ARM   Final   Special Requests BOTTLES DRAWN AEROBIC AND ANAEROBIC 10CC   Final   Culture  Setup Time     Final   Value: 05/30/2014 19:00     Performed at [facility]   Culture     Final   Value:        BLOOD CULTURE RECEIVED NO GROWTH TO DATE CULTURE WILL BE HELD FOR 5 DAYS BEFORE ISSUING A FINAL NEGATIVE REPORT     Performed at [facility]   Report Status PENDING   Incomplete  CULTURE, BLOOD (ROUTINE X 2)     Status: None   Collection Time    05/30/14 12:10 PM      Result Value Ref Range Status   Specimen Description BLOOD RIGHT HAND   Final   Special Requests BOTTLES DRAWN AEROBIC AND ANAEROBIC 5CC   Final   Culture  Setup Time      Final   Value: 05/30/2014 19:00     Performed at [facility]   Culture     Final   Value:        BLOOD CULTURE RECEIVED NO GROWTH TO DATE CULTURE WILL BE HELD FOR 5 DAYS BEFORE ISSUING A FINAL NEGATIVE REPORT     Performed at [facility]   Report Status PENDING   Incomplete     Studies:  Recent x-ray studies have been reviewed in detail by the Attending Physician  Scheduled Meds:  Scheduled Meds: . antiseptic oral rinse  7 mL Mouth Rinse BID  . chlorhexidine  15 mL Mouth Rinse BID  . docusate  200 mg Oral BID  . feeding supplement (JEVITY 1.2 CAL)  1,000 mL Per Tube Q24H  . free water  200 mL Per Tube 4 times per day  . levETIRAcetam  500 mg Per Tube Q0600  . levETIRAcetam  750 mg Per Tube BID  . levothyroxine  75 mcg Per Tube QAC breakfast  . Valproic Acid  750 mg Per Tube TID   Time spent on care of this patient: 35 mins  ELLIS,ALLISON L. , ANP   [facility] Office  [phone] Pager - [phone]  On-Call/Text Page:      [email or web address]      password [email or web address]  If 7PM-7AM, please contact night-coverage [email or web address] Password [email or web address] 05/31/2014, 11:09 AM   LOS: 17 days   I have personally examined this patient and reviewed the entire database. I have reviewed the above note, made any necessary editorial changes, and  agree with its content.  Cherene Altes, MD [facility]

## 2014-05-31 NOTE — Progress Notes (Signed)
Pt transferred from [facility] to room 6E01 via bed.  Pt reponds to voice.  VSS.  Jevity going at 55 ml/hr.  NS @ 125 ml/hr.  SCD's on.

## 2014-05-31 NOTE — Progress Notes (Signed)
Pt has been spiking temps of 101.3/99.7, BC pending and pt not on any antibiotics at this time. Paged Dr. Hilbert Corrigan.

## 2014-05-31 NOTE — Clinical Social Work Placement (Addendum)
    Clinical Social Work Department CLINICAL SOCIAL WORK PLACEMENT NOTE 05/31/2014  Patient:  NOVIA, LANSBERRY  Account Number:  [id number] Admit date:  05/14/2014  Clinical Social Worker:  Butch Penny CROWDER, LCSWA  Date/time:  05/28/2014 05:36 PM  Clinical Social Work is seeking post-discharge placement for this patient at the following level of care:   SKILLED NURSING   (*CSW will update this form in Epic as items are completed)   05/28/2014  Patient/family provided with [place] Department of Clinical Social Work's list of facilities offering this level of care within the geographic area requested by the patient (or if unable, by the patient's family).  05/28/2014  Patient/family informed of their freedom to choose among providers that offer the needed level of care, that participate in Medicare, Medicaid or managed care program needed by the patient, have an available bed and are willing to accept the patient.  05/28/2014  Patient/family informed of [facility]' ownership interest in [facility], as well as of the fact that they are under no obligation to receive care at this facility.  PASARR submitted to EDS on 05/26/2014 PASARR number received on 06/02/2014 - [phone] F - expires 08/01/14  FL2 transmitted to all facilities in geographic area requested by pt/family on  05/31/2014 FL2 transmitted to all facilities within larger geographic area on   Patient informed that his/her managed care company has contracts with or will negotiate with  certain facilities, including the following:   NA     Patient/family informed of bed offers received: 06/01/14 (Sister contacted). Patient chooses bed at [facility] Physician recommends and patient chooses bed at    Patient to be transferred to [facility] on 06/09/14   Patient to be transferred to facility by [facility]) Patient and family notified of transfer on 06/09/14 Name of family member notified:  Sister, [name] - (at the bedside)  The following physician request were entered in Epic:  Additional Comments: 06/08/14: Call made to [facility] and spoke with admissions director, Lexine Baton regarding [place] rehab. She will review patient's information and contact CSW Wednesday morning.

## 2014-05-31 NOTE — Progress Notes (Signed)
UR completed. Rosana Farnell RN CCM Case Mgmt phone [phone] 

## 2014-06-01 ENCOUNTER — Inpatient Hospital Stay (HOSPITAL_COMMUNITY): Payer: Medicare Other

## 2014-06-01 ENCOUNTER — Encounter (HOSPITAL_COMMUNITY): Payer: Self-pay | Admitting: Radiology

## 2014-06-01 DIAGNOSIS — F79 Unspecified intellectual disabilities: Secondary | ICD-10-CM

## 2014-06-01 DIAGNOSIS — R0609 Other forms of dyspnea: Secondary | ICD-10-CM

## 2014-06-01 DIAGNOSIS — R509 Fever, unspecified: Secondary | ICD-10-CM

## 2014-06-01 DIAGNOSIS — R0989 Other specified symptoms and signs involving the circulatory and respiratory systems: Secondary | ICD-10-CM

## 2014-06-01 LAB — CBC
HCT: 24.3 % — ABNORMAL LOW (ref 36.0–46.0)
HEMOGLOBIN: 7.9 g/dL — AB (ref 12.0–15.0)
MCH: 30.3 pg (ref 26.0–34.0)
MCHC: 32.5 g/dL (ref 30.0–36.0)
MCV: 93.1 fL (ref 78.0–100.0)
PLATELETS: 276 10*3/uL (ref 150–400)
RBC: 2.61 MIL/uL — AB (ref 3.87–5.11)
RDW: 17.3 % — ABNORMAL HIGH (ref 11.5–15.5)
WBC: 18.9 10*3/uL — AB (ref 4.0–10.5)

## 2014-06-01 LAB — URINE CULTURE
Colony Count: NO GROWTH
Culture: NO GROWTH

## 2014-06-01 LAB — CLOSTRIDIUM DIFFICILE BY PCR: CDIFFPCR: NEGATIVE

## 2014-06-01 MED ORDER — IOHEXOL 300 MG/ML  SOLN
80.0000 mL | Freq: Once | INTRAMUSCULAR | Status: AC | PRN
  Administered 2014-06-01: 80 mL via INTRAVENOUS

## 2014-06-01 MED ORDER — IOHEXOL 300 MG/ML  SOLN
25.0000 mL | INTRAMUSCULAR | Status: AC
  Administered 2014-06-01 (×2): 25 mL via ORAL

## 2014-06-01 NOTE — Progress Notes (Signed)
Progress Note  ALIANYS CHACKO QIW:[id number] DOB: 19-Jun-1946 DOA: 05/14/2014 PCP: [name]  Admit HPI / Brief Narrative: 68 y.o. F Hx of Seizures; diastolic CHF, atrial fibrillation, pituitary microadenoma (diagnosed 2009), hypothyroidism; High cholesterol and CAD. She also has developmental disability and at baseline non verbal and wheelchair bound status   She presented after staff at the group home noticed she had stopped eating for the past few days and seemed to be unable to swallow. No apparent fever or chills. She had been pocketing food in her mouth. Had dental visit the previous week. Reportedly she had similar episodes of these symptoms in the past and recovered.   Upon arrival to emergency department patient was noted to be hypotensive (SBP down to 70s). She was given IV fluids and after 2 L blood pressure improved to her baseline of 110s. Sister was at the bedside so the admitting MD discussed goals of care. Family wished for her to be limited code. Patient appeared to have urinary tract infection leading to sepsis. Zosyn and vancomycin had been started by the EDP. There was also concern over possible aspiration although chest x-ray was unremarkable. She had been on mechanical soft diet for years with thin fluids but for several days prior to presentation she was switched to nectar thick due to concerns of active aspiration.dysphagia progression. For the 24 hours prior to presenatation she had stopped eating and drinking altogether. Per group home 2 weeks ago patient was treated for mild pneumonia.   Patient has been febrile the last 2 days without clear source of infection. She is of of antibiotics. C diff was negative. Will consult ID.  HPI: Non verbal as per baseline.   Assessment/Plan:  Sepsis / septic shock due to pan sensitive E coli UTI  -She completed a 7 day abx course for UTI - 8/16 overnight patient became hypotensive, spiked a fever and became tachycardic so  was bolused 1L NS and normal saline at 138ml/hr -FU blood and urine cx's remain negative - WBCs continue to trend up -PCXR persistent LLL infiltrate and improved aeration RLL -So far antibiotics have been held. C diff is negative. No clear source of fever. Will involve ID to assist. Might have to consider Ct imaging.   Right facial droop  -non contrasted CT Head normal 8/4 - suspect this is her baseline facies   Cerebral palsy / Acute metabolic encephalopathy / dysphagia  -has chronic dysphagia  -Has abdominal binder post PEG procedure   Hypernatremia with dehydration  -Na improved / normalized -Continue to monitor  Hypokalemia  -repleted per tube and now resolved  -Continue to monitor  Symptomatic anemia  -8/1 transfused 3 units PRBC - Hgb drifting down but not obvious gross blood loss - follow trend   Thrombocytopenia  -Stable, continue to monitor    Chronic Diastolic heart failure  -no evidence of volume overload at this time   Paroxysmal atrial fibrillation  -Currrently sinus tachycardia   Dehydration  -resolved   Hypocalcemia  -Corrected calcium normal   Seizure disorder  -Continue Keppra and Depacon  -repeat drug levels therapeutic 8/8  Code Status: DNR Family Communication: no family present at time of exam. Patients Guardian is Nehemiah Settle [phone].  Disposition Plan: SNF  Consultants: Dr. Aletta Edouard (interventional radiology)  Procedure/Significant Events: 8/14 gastrostomy tube placed 8fr 8/16 PCXR;Persistent infiltrate in LEFT lower lobe with improved aeration at  RIGHT base.   Antibiotics: Zosyn 7/31>> stopped 8/03  Vancomycin 7/31>> stopped 8/03  Rocephin  8/03 >> stopped 8/07  Rocephin 8/11 > stopped 8/13  DVT prophylaxis: SCDs  Devices Gastrostomy tube   Objective: VITAL SIGNS: Temp: 100.9 F (38.3 C) (08/18 0644) Temp src: Oral (08/18 0644) BP: 132/111 mmHg (08/18 0644) Pulse Rate: 111 (08/18 0644) SPO2; 97% on room  air FIO2:  Intake/Output Summary (Last 24 hours) at 06/01/14 0815 Last data filed at 05/31/14 1100  Gross per 24 hour  Intake    375 ml  Output      0 ml  Net    375 ml   Exam: General: Awake, non communicative/non verbal, no respiratory distress   Lungs: decreased air entry at bases. No rhonchi. Cardiovascular: Regular rhythm without gallop or rub- 2/6 systolic murmur Abdomen: Slight tenderness elicited on left side. No rebound rigidity or guarding. bowel sounds positive. gastrostomy tube site unremarkable Extremities: No significant cyanosis, clubbing - minimal pedal edema which appears chronic - mittens in place  Data Reviewed: Basic Metabolic Panel:  Recent Labs Lab 05/26/14 0359 05/30/14 0409 05/31/14 0520  NA 145 140 141  K 4.4 4.0 3.9  CL 107 107 109  CO2 29 23 22   GLUCOSE 155* 163* 128*  BUN 14 6 7   CREATININE 0.55 0.62 0.58  CALCIUM 7.8* 8.0* 7.7*   Liver Function Tests:  Recent Labs Lab 05/30/14 0409 05/31/14 0520  AST 17 13  ALT 11 9  ALKPHOS 258* 200*  BILITOT 0.4 0.3  PROT 6.4 6.1  ALBUMIN 1.3* 1.2*   CBC:  Recent Labs Lab 05/26/14 0359 05/30/14 0409 05/31/14 0520 06/01/14 0657  WBC 10.0 18.5* 19.3* 18.9*  NEUTROABS  --  12.4* 12.9*  --   HGB 9.5* 8.6* 7.4* 7.9*  HCT 29.1* 26.0* 23.1* 24.3*  MCV 91.5 94.5 92.4 93.1  PLT 93* 257 244 276   CBG:  Recent Labs Lab 05/27/14 1007 05/27/14 1235  GLUCAP 96 92    Recent Results (from the past 240 hour(s))  URINE CULTURE     Status: None   Collection Time    05/25/14  9:47 AM      Result Value Ref Range Status   Specimen Description URINE, CATHETERIZED   Final   Special Requests NONE   Final   Culture  Setup Time     Final   Value: 05/25/2014 17:20     Performed at [facility]     Final   Value: NO GROWTH     Performed at [facility]   Culture     Final   Value: NO GROWTH     Performed at [facility]   Report Status 05/26/2014 FINAL    Final  CULTURE, BLOOD (ROUTINE X 2)     Status: None   Collection Time    05/25/14 12:15 PM      Result Value Ref Range Status   Specimen Description BLOOD LEFT HAND   Final   Special Requests BOTTLES DRAWN AEROBIC AND ANAEROBIC 10CC   Final   Culture  Setup Time     Final   Value: 05/25/2014 16:18     Performed at [facility]   Culture     Final   Value: NO GROWTH 5 DAYS     Performed at [facility]   Report Status 05/31/2014 FINAL   Final  CULTURE, BLOOD (ROUTINE X 2)     Status: None   Collection Time    05/25/14 12:27 PM  Result Value Ref Range Status   Specimen Description BLOOD RIGHT HAND   Final   Special Requests BOTTLES DRAWN AEROBIC AND ANAEROBIC 10CC   Final   Culture  Setup Time     Final   Value: 05/25/2014 16:18     Performed at [facility]   Culture     Final   Value: NO GROWTH 5 DAYS     Performed at [facility]   Report Status 05/31/2014 FINAL   Final  CATH TIP CULTURE     Status: None   Collection Time    05/25/14  3:30 PM      Result Value Ref Range Status   Specimen Description CATH TIP   Final   Special Requests LEFT SUBCLAVIAN   Final   Culture     Final   Value: NO GROWTH 2 DAYS     Performed at [facility]   Report Status 05/28/2014 FINAL   Final  CULTURE, BLOOD (ROUTINE X 2)     Status: None   Collection Time    05/30/14 12:00 PM      Result Value Ref Range Status   Specimen Description BLOOD RIGHT ARM   Final   Special Requests BOTTLES DRAWN AEROBIC AND ANAEROBIC 10CC   Final   Culture  Setup Time     Final   Value: 05/30/2014 19:00     Performed at [facility]   Culture     Final   Value:        BLOOD CULTURE RECEIVED NO GROWTH TO DATE CULTURE WILL BE HELD FOR 5 DAYS BEFORE ISSUING A FINAL NEGATIVE REPORT     Performed at [facility]   Report Status PENDING   Incomplete  CULTURE, BLOOD (ROUTINE X 2)     Status: None   Collection Time    05/30/14 12:10 PM      Result Value  Ref Range Status   Specimen Description BLOOD RIGHT HAND   Final   Special Requests BOTTLES DRAWN AEROBIC AND ANAEROBIC 5CC   Final   Culture  Setup Time     Final   Value: 05/30/2014 19:00     Performed at [facility]   Culture     Final   Value:        BLOOD CULTURE RECEIVED NO GROWTH TO DATE CULTURE WILL BE HELD FOR 5 DAYS BEFORE ISSUING A FINAL NEGATIVE REPORT     Performed at [facility]   Report Status PENDING   Incomplete  URINE CULTURE     Status: None   Collection Time    05/30/14  5:16 PM      Result Value Ref Range Status   Specimen Description URINE, CATHETERIZED   Final   Special Requests NONE   Final   Culture  Setup Time     Final   Value: 05/31/2014 02:09     Performed at [facility]     Final   Value: NO GROWTH     Performed at [facility]   Culture     Final   Value: NO GROWTH     Performed at [facility]   Report Status 06/01/2014 FINAL   Final      Scheduled Meds:  Scheduled Meds: . antiseptic oral rinse  7 mL Mouth Rinse BID  . chlorhexidine  15 mL Mouth Rinse BID  . docusate  200 mg  Per Tube BID  . feeding supplement (JEVITY 1.2 CAL)  1,000 mL Per Tube Q24H  . free water  200 mL Per Tube 4 times per day  . levETIRAcetam  500 mg Per Tube Q0600  . levETIRAcetam  750 mg Per Tube BID  . levothyroxine  75 mcg Per Tube QAC breakfast  . Valproic Acid  750 mg Per Tube TID   Time spent on care of this patient: 35 mins  [facility] Office  [phone] Pager - [phone]  On-Call/Text Page:      [email or web address]      password [email or web address]  If 7PM-7AM, please contact night-coverage [email or web address] Password [email or web address] 06/01/2014, 8:15 AM   LOS: 18 days

## 2014-06-01 NOTE — Progress Notes (Signed)
Pt had a very large stool form med brow

## 2014-06-01 NOTE — Progress Notes (Signed)
NUTRITION FOLLOW UP  INTERVENTION: -Continue current TF regimen: Jevity 1.2 formula at 55 ml/hr via PEG, which provides 1584 kcals, 73 gm protein, and 1065 ml of free water.   NUTRITION DIAGNOSIS: Inadequate oral intake related to inability to eat as evidenced by NPO status, ongoing  Goal: Pt to meet >/= 90% of their estimated nutrition needs, met  Monitor:  TF regimen & tolerance, weight, labs, I/O's  ASSESSMENT: 68 y.o. Female with PMH of seizures, osteoporosis, thyroid disease, and CAD; admitted with sepsis due to UTI and possible aspiration, severe dehydration due to poor by mouth intake likely secondary to severe tooth decay resulted in hypernatremia.   8/11-Jevity 1.2 formula currently infusing at 55 ml/hr via Panda tube (tip in fundus of stomach) providing 1584 kcals, 73 gm protein, 1065 ml of free water.  Noted patient pulled out feeding tube 5:54 AM; replaced at 08:00. -Speech Path notes reviewed.  Prognosis for PO diet initiation appears fair-poor. -IR request for PEG tube placement.  Patient with temp of 100.9.  8/18- PEG tube placed 8/14. Sepsis has resolved, but pt temperature has been spiking to 101. Per SLP note, poor prognosis for swallow function, no gains made.  Jevity 1.2 formula currently infusing at 55 ml/hr via PEG tube providing 1584 kcals, 73 gm protein, 1065 ml of free water.   Per RN, pt has been tolerating the tube feeding well.   Labs: Low calcium. High alkaline phosphatase.  Height: Ht Readings from Last 1 Encounters:  05/14/14 5' 1.81" (1.57 m)    Weight: Wt Readings from Last 1 Encounters:  05/31/14 142 lb 10.2 oz (64.7 kg)  Admit wt 125 lb   BMI:  Body mass index is 26.25 kg/(m^2).  Re-Estimated Nutritional Needs: Kcal: 1500-1700 Protein: 70-85 gm Fluid: 1.5-1.7 L  Skin: Stage II pressure ulcer on buttocks, +1 generalized edema  Diet Order: NPO  Last BM: 8/14   Intake/Output Summary (Last 24 hours) at 06/01/14 0851 Last data filed  at 05/31/14 1100  Gross per 24 hour  Intake    375 ml  Output      0 ml  Net    375 ml   Labs:   Recent Labs Lab 05/26/14 0359 05/30/14 0409 05/31/14 0520  NA 145 140 141  K 4.4 4.0 3.9  CL 107 107 109  CO2 '29 23 22  ' BUN '14 6 7  ' CREATININE 0.55 0.62 0.58  CALCIUM 7.8* 8.0* 7.7*  GLUCOSE 155* 163* 128*    Scheduled Meds: . antiseptic oral rinse  7 mL Mouth Rinse BID  . chlorhexidine  15 mL Mouth Rinse BID  . docusate  200 mg Per Tube BID  . feeding supplement (JEVITY 1.2 CAL)  1,000 mL Per Tube Q24H  . free water  200 mL Per Tube 4 times per day  . levETIRAcetam  500 mg Per Tube Q0600  . levETIRAcetam  750 mg Per Tube BID  . levothyroxine  75 mcg Per Tube QAC breakfast  . Valproic Acid  750 mg Per Tube TID    Continuous Infusions: . sodium chloride 50 mL/hr at 06/01/14 0042    Past Medical History  Diagnosis Date  . Seizures   . Osteoporosis   . Thyroid disease     hypothyroid  . High cholesterol   . Cataracts, bilateral   . Optic atrophy   . Paraparesis     mild  . Hypothyroidism   . Anginal pain   . Coronary artery disease  Past Surgical History  Procedure Laterality Date  . Midline incision    . Central venous catheter insertion  05/15/2014         Kallie Locks, MS, [facility] LDN Pager # [phone] After hours/ weekend pager # [phone]

## 2014-06-01 NOTE — Progress Notes (Signed)
Note/chart reviewed.  Katie Mayu Ronk, RD, LDN Pager #: [phone] After-Hours Pager #: [phone]  

## 2014-06-01 NOTE — Consult Note (Signed)
[facility]     Reason for Consult: Fever without a source    Referring Physician: Dr. Curly Rim  Active Problems:   Seizure disorder   MR (mental retardation)   Hypothyroidism   HLD (hyperlipidemia)   Hypotension   Dehydration   Anemia   Diastolic heart failure, NYHA class 2   Hypothermia   Sepsis   Paroxysmal a-fib   Dysphasia   UTI (lower urinary tract infection)   Tooth decay   Hypernatremia   . antiseptic oral rinse  7 mL Mouth Rinse BID  . chlorhexidine  15 mL Mouth Rinse BID  . docusate  200 mg Per Tube BID  . feeding supplement (JEVITY 1.2 CAL)  1,000 mL Per Tube Q24H  . free water  200 mL Per Tube 4 times per day  . levETIRAcetam  500 mg Per Tube Q0600  . levETIRAcetam  750 mg Per Tube BID  . levothyroxine  75 mcg Per Tube QAC breakfast  . Valproic Acid  750 mg Per Tube TID    Recommendations: CT C/A/P with contrast for ? Occult abscess, history of surgery, or other etiology  Will monitor off antibiotics pending a source  Will check TSH to assure not related to thyroid Not likely medications   Assessment: Fever, some hypotension 8/16, no localizing signs.  No diarrhea, some mild dypsnea noted on exam.  ? If aspirating, pneumonitis.   ? Urinary but UA clear now.  Alk phos mildly up but no gall bladder.     Antibiotics: Completed ceftriaxone for E coli pylenephritis with sepsis  HPI: Dawn [name] is a 68 y.o. female with cerebral palsy, mental dysfunction, non verbal at baseline, diastolic CHF, hypothyroid who presented 7/31 with SIRS related to UTI/pyelonephritis.  Culture grew E coli and she completed IV antibiotics.  Around 8/16 began to develop fever, had an episode of hypotension. Work up with CXR, UA, has been unrevealing.  Line removed and blood cultures have remained negative.  Minimal loose stool but C diff neg.  Got a PEG tube for feeding.  Has a history too of recurrent infections and hydronephrosis, left kidney  atrophy.  History of cholecystectomy.      Review of Systems: Review of systems not obtained due to patient factors.  Past Medical History  Diagnosis Date  . Seizures   . Osteoporosis   . Thyroid disease     hypothyroid  . High cholesterol   . Cataracts, bilateral   . Optic atrophy   . Paraparesis     mild  . Hypothyroidism   . Anginal pain   . Coronary artery disease     History  Substance Use Topics  . Smoking status: Never Smoker   . Smokeless tobacco: Never Used  . Alcohol Use: No    Family History  Problem Relation Age of Onset  . Colon cancer Mother   . Diabetes type II Sister    Allergies  Allergen Reactions  . Carvedilol   . Ppd [Tuberculin Purified Protein Derivative]     Per MAR    OBJECTIVE: Blood pressure 116/56, pulse 108, temperature 99.9 F (37.7 C), temperature source Oral, resp. rate 28, height 5' 1.81" (1.57 m), weight 142 lb 10.2 oz (64.7 kg), SpO2 97.00%. General: awake, does nt respond to questions Skin: no rashes, no lesions, no ulcers Lungs: CTA B Cor: RRR without m Abdomen: soft, nt, nd, +PEG in place Ext: 1+ edema in right foot, otherwise no  edema  Microbiology: Recent Results (from the past 240 hour(s))  URINE CULTURE     Status: None   Collection Time    05/25/14  9:47 AM      Result Value Ref Range Status   Specimen Description URINE, CATHETERIZED   Final   Special Requests NONE   Final   Culture  Setup Time     Final   Value: 05/25/2014 17:20     Performed at [facility]     Final   Value: NO GROWTH     Performed at [facility]   Culture     Final   Value: NO GROWTH     Performed at [facility]   Report Status 05/26/2014 FINAL   Final  CULTURE, BLOOD (ROUTINE X 2)     Status: None   Collection Time    05/25/14 12:15 PM      Result Value Ref Range Status   Specimen Description BLOOD LEFT HAND   Final   Special Requests BOTTLES DRAWN AEROBIC AND ANAEROBIC 10CC   Final    Culture  Setup Time     Final   Value: 05/25/2014 16:18     Performed at [facility]   Culture     Final   Value: NO GROWTH 5 DAYS     Performed at [facility]   Report Status 05/31/2014 FINAL   Final  CULTURE, BLOOD (ROUTINE X 2)     Status: None   Collection Time    05/25/14 12:27 PM      Result Value Ref Range Status   Specimen Description BLOOD RIGHT HAND   Final   Special Requests BOTTLES DRAWN AEROBIC AND ANAEROBIC 10CC   Final   Culture  Setup Time     Final   Value: 05/25/2014 16:18     Performed at [facility]   Culture     Final   Value: NO GROWTH 5 DAYS     Performed at [facility]   Report Status 05/31/2014 FINAL   Final  CATH TIP CULTURE     Status: None   Collection Time    05/25/14  3:30 PM      Result Value Ref Range Status   Specimen Description CATH TIP   Final   Special Requests LEFT SUBCLAVIAN   Final   Culture     Final   Value: NO GROWTH 2 DAYS     Performed at [facility]   Report Status 05/28/2014 FINAL   Final  CULTURE, BLOOD (ROUTINE X 2)     Status: None   Collection Time    05/30/14 12:00 PM      Result Value Ref Range Status   Specimen Description BLOOD RIGHT ARM   Final   Special Requests BOTTLES DRAWN AEROBIC AND ANAEROBIC 10CC   Final   Culture  Setup Time     Final   Value: 05/30/2014 19:00     Performed at [facility]   Culture     Final   Value:        BLOOD CULTURE RECEIVED NO GROWTH TO DATE CULTURE WILL BE HELD FOR 5 DAYS BEFORE ISSUING A FINAL NEGATIVE REPORT     Performed at [facility]   Report Status PENDING   Incomplete  CULTURE, BLOOD (ROUTINE X 2)     Status: None   Collection Time    05/30/14  12:10 PM      Result Value Ref Range Status   Specimen Description BLOOD RIGHT HAND   Final   Special Requests BOTTLES DRAWN AEROBIC AND ANAEROBIC 5CC   Final   Culture  Setup Time     Final   Value: 05/30/2014 19:00     Performed at [facility]   Culture      Final   Value:        BLOOD CULTURE RECEIVED NO GROWTH TO DATE CULTURE WILL BE HELD FOR 5 DAYS BEFORE ISSUING A FINAL NEGATIVE REPORT     Performed at [facility]   Report Status PENDING   Incomplete  URINE CULTURE     Status: None   Collection Time    05/30/14  5:16 PM      Result Value Ref Range Status   Specimen Description URINE, CATHETERIZED   Final   Special Requests NONE   Final   Culture  Setup Time     Final   Value: 05/31/2014 02:09     Performed at [facility]     Final   Value: NO GROWTH     Performed at [facility]   Culture     Final   Value: NO GROWTH     Performed at [facility]   Report Status 06/01/2014 FINAL   Final  CLOSTRIDIUM DIFFICILE BY PCR     Status: None   Collection Time    06/01/14  8:27 AM      Result Value Ref Range Status   C difficile by pcr NEGATIVE  NEGATIVE Final    Scharlene Gloss, [email or web address] [phone] pager  [phone] cell 06/01/2014, 2:17 PM

## 2014-06-02 DIAGNOSIS — E039 Hypothyroidism, unspecified: Secondary | ICD-10-CM

## 2014-06-02 DIAGNOSIS — J90 Pleural effusion, not elsewhere classified: Secondary | ICD-10-CM

## 2014-06-02 DIAGNOSIS — D72829 Elevated white blood cell count, unspecified: Secondary | ICD-10-CM

## 2014-06-02 DIAGNOSIS — R609 Edema, unspecified: Secondary | ICD-10-CM

## 2014-06-02 LAB — TSH: TSH: 12.96 u[IU]/mL — AB (ref 0.350–4.500)

## 2014-06-02 MED ORDER — LEVOTHYROXINE SODIUM 88 MCG PO TABS
88.0000 ug | ORAL_TABLET | Freq: Every day | ORAL | Status: DC
  Administered 2014-06-03 – 2014-06-09 (×7): 88 ug
  Filled 2014-06-02 (×10): qty 1

## 2014-06-02 MED ORDER — ATORVASTATIN CALCIUM 10 MG PO TABS
10.0000 mg | ORAL_TABLET | Freq: Every day | ORAL | Status: DC
  Administered 2014-06-02 – 2014-06-09 (×8): 10 mg
  Filled 2014-06-02 (×8): qty 1

## 2014-06-02 MED ORDER — ASPIRIN 81 MG PO CHEW
81.0000 mg | CHEWABLE_TABLET | Freq: Every day | ORAL | Status: DC
  Administered 2014-06-02 – 2014-06-09 (×8): 81 mg
  Filled 2014-06-02 (×5): qty 1

## 2014-06-02 MED ORDER — FUROSEMIDE 10 MG/ML IJ SOLN
40.0000 mg | Freq: Once | INTRAMUSCULAR | Status: AC
  Administered 2014-06-02: 40 mg via INTRAVENOUS
  Filled 2014-06-02: qty 4

## 2014-06-02 NOTE — Progress Notes (Addendum)
PROGRESS NOTE    Dawn [name] CXK:[id number] DOB: [birth year] DOA: 05/14/2014 PCP: [name]  HPI/Brief narrative 68 y.o. 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 Culture grew E coli and she completed IV antibiotics. Around 8/16 began to develop fever, had an episode of hypotension. Work up with CXR, UA, has been unrevealing. Line removed and blood cultures have remained negative. Minimal loose stool but C diff neg. Got a PEG tube for feeding. Has a history too of recurrent infections and hydronephrosis, left kidney atrophy. History of cholecystectomy. ID consulted 8/18.   Assessment/Plan:  Sepsis / septic shock due to pan sensitive E coli UTI  -She completed a 7 day abx course for UTI  - 8/16 overnight patient became hypotensive, spiked a fever and became tachycardic so was bolused 1L NS and normal saline at 112ml/hr  -FU blood and urine cx's remain negative - persistent Leukocytosis. -PCXR persistent LLL infiltrate and improved aeration RLL  -So far antibiotics have been held. C diff is negative. No clear source of fever.  - ID consultation 8/18 appreciated. CT chest shows b/l pleural effusions R>L. Await ID FU re tapping. CT Abdomen shows unusual FB in stomach and ? Mild pancreatitis. GI/Dr. Collene Mares consulted 8/19 re evaluation of FB.  Right facial droop  -non contrasted CT Head normal 8/4 - suspect this is her baseline facies   Cerebral palsy / Acute metabolic encephalopathy / dysphagia/Seizure disorder  -has chronic dysphagia  -Has abdominal binder post PEG procedure  - Tolerating PEG feeds @ 39ml/hour - Continue Keppra and Depacon  - Keppra level: 50.3 & Valproate level: 75.1  Hypernatremia with dehydration  -Na normalized  - now volume overloaded- DC IVF  Hypokalemia    -repleted per tube and now resolved  -Continue to monitor   Symptomatic anemia  -8/1 transfused 3 units PRBC - Hgb drifting down but not obvious gross blood loss - follow trend - stable over last 24 hours.  Thrombocytopenia  -Resolved  Chronic Diastolic heart failure/Anasarca/b/l Pleural effusions  - appears volume overloaded clinically and on CT. Not on diuretics PTA. Provide single dose of IV lasix and monitor clinically  Paroxysmal atrial fibrillation  -Currrently sinus tachycardia. Not on tele.  Hypocalcemia  -Corrected calcium normal   FUO - as above. TSH elevated and hence not cause of this. We'll be difficult to get lower extremity venous Doppler secondary to contractures but will try to rule out DVT.  Hypothyroid - Increase Synthroid to 88 mcg daily. Followup TSH in 4-6 weeks.   Code Status: DNR Family Communication: None at bedside. Disposition Plan:  SNF when medically stable.   Consultants:  IR- Dr. Kathlene Cote  GI/Dr. Collene Mares- pending  ID- Dr. Scharlene Gloss.  Procedures:  8/14 gastrostomy tube placed 22fr   Antibiotics: Zosyn 7/31>> stopped 8/03  Vancomycin 7/31>> stopped 8/03  Rocephin 8/03 >> stopped 8/07  Rocephin 8/11 > stopped 8/13   Subjective: Non verbal. As per nursing, no acute changes.  Objective: Filed Vitals:   06/01/14 1817 06/01/14 2028 06/02/14 0402 06/02/14 0951  BP: 120/58 131/72 135/70 97/71  Pulse: 105 108 105 118  Temp: 99.5 F (37.5 C) 97.7 F (36.5 C) 97.6 F (36.4 C) 99.7 F (37.6 C)  TempSrc: Oral Oral Oral Oral  Resp: 22 20 20 21   Height:      Weight:  64.7 kg (142  lb 10.2 oz)    SpO2: 98% 95% 93% 96%    Intake/Output Summary (Last 24 hours) at 06/02/14 1045 Last data filed at 06/02/14 0900  Gross per 24 hour  Intake      0 ml  Output      4 ml  Net     -4 ml   Filed Weights   05/30/14 0431 05/31/14 0459 06/01/14 2028  Weight: 62.143 kg (137 lb) 64.7 kg (142 lb 10.2 oz) 64.7 kg (142 lb 10.2 oz)      Exam:  General exam: Moderately built and poorly nourished female lying comfortably in right lateral position without distress. Respiratory system: Poor inspiratory effort but reduced breath sounds in the bases L>R. Rest of lung fields clear to auscultation. No increased work of breathing. Cardiovascular system: S1 & S2 heard, regular tachycardic. No JVD, murmurs, gallops, clicks. 1+ bilateral leg edema. Gastrointestinal system: Abdomen is nondistended, soft and nontender. Normal bowel sounds heard. PEG tube site without acute findings. Midline laparotomy scar. Central nervous system: Alert but nonverbal and does not respond to questions Extremities: Moves upper extremities spontaneously. Contractures of bilateral lower extremities.   Data Reviewed: Basic Metabolic Panel:  Recent Labs Lab 05/30/14 0409 05/31/14 0520  NA 140 141  K 4.0 3.9  CL 107 109  CO2 23 22  GLUCOSE 163* 128*  BUN 6 7  CREATININE 0.62 0.58  CALCIUM 8.0* 7.7*   Liver Function Tests:  Recent Labs Lab 05/30/14 0409 05/31/14 0520  AST 17 13  ALT 11 9  ALKPHOS 258* 200*  BILITOT 0.4 0.3  PROT 6.4 6.1  ALBUMIN 1.3* 1.2*   No results found for this basename: LIPASE, AMYLASE,  in the last 168 hours No results found for this basename: AMMONIA,  in the last 168 hours CBC:  Recent Labs Lab 05/30/14 0409 05/31/14 0520 06/01/14 0657  WBC 18.5* 19.3* 18.9*  NEUTROABS 12.4* 12.9*  --   HGB 8.6* 7.4* 7.9*  HCT 26.0* 23.1* 24.3*  MCV 94.5 92.4 93.1  PLT 257 244 276   Cardiac Enzymes: No results found for this basename: CKTOTAL, CKMB, CKMBINDEX, TROPONINI,  in the last 168 hours BNP (last 3 results)  Recent Labs  10/28/13 1547  PROBNP 488.2*   CBG:  Recent Labs Lab 05/27/14 1007 05/27/14 1235  GLUCAP 96 92    Recent Results (from the past 240 hour(s))  URINE CULTURE     Status: None   Collection Time    05/25/14  9:47 AM      Result Value Ref Range Status   Specimen Description  URINE, CATHETERIZED   Final   Special Requests NONE   Final   Culture  Setup Time     Final   Value: 05/25/2014 17:20     Performed at [place]     Final   Value: NO GROWTH     Performed at [facility]   Culture     Final   Value: NO GROWTH     Performed at [facility]   Report Status 05/26/2014 FINAL   Final  CULTURE, BLOOD (ROUTINE X 2)     Status: None   Collection Time    05/25/14 12:15 PM      Result Value Ref Range Status   Specimen Description BLOOD LEFT HAND   Final   Special Requests BOTTLES DRAWN AEROBIC AND ANAEROBIC 10CC   Final   Culture  Setup Time  Final   Value: 05/25/2014 16:18     Performed at [facility]   Culture     Final   Value: NO GROWTH 5 DAYS     Performed at [facility]   Report Status 05/31/2014 FINAL   Final  CULTURE, BLOOD (ROUTINE X 2)     Status: None   Collection Time    05/25/14 12:27 PM      Result Value Ref Range Status   Specimen Description BLOOD RIGHT HAND   Final   Special Requests BOTTLES DRAWN AEROBIC AND ANAEROBIC 10CC   Final   Culture  Setup Time     Final   Value: 05/25/2014 16:18     Performed at [facility]   Culture     Final   Value: NO GROWTH 5 DAYS     Performed at [facility]   Report Status 05/31/2014 FINAL   Final  CATH TIP CULTURE     Status: None   Collection Time    05/25/14  3:30 PM      Result Value Ref Range Status   Specimen Description CATH TIP   Final   Special Requests LEFT SUBCLAVIAN   Final   Culture     Final   Value: NO GROWTH 2 DAYS     Performed at [facility]   Report Status 05/28/2014 FINAL   Final  CULTURE, BLOOD (ROUTINE X 2)     Status: None   Collection Time    05/30/14 12:00 PM      Result Value Ref Range Status   Specimen Description BLOOD RIGHT ARM   Final   Special Requests BOTTLES DRAWN AEROBIC AND ANAEROBIC 10CC   Final   Culture  Setup Time     Final   Value: 05/30/2014 19:00      Performed at [facility]   Culture     Final   Value:        BLOOD CULTURE RECEIVED NO GROWTH TO DATE CULTURE WILL BE HELD FOR 5 DAYS BEFORE ISSUING A FINAL NEGATIVE REPORT     Performed at [facility]   Report Status PENDING   Incomplete  CULTURE, BLOOD (ROUTINE X 2)     Status: None   Collection Time    05/30/14 12:10 PM      Result Value Ref Range Status   Specimen Description BLOOD RIGHT HAND   Final   Special Requests BOTTLES DRAWN AEROBIC AND ANAEROBIC 5CC   Final   Culture  Setup Time     Final   Value: 05/30/2014 19:00     Performed at [facility]   Culture     Final   Value:        BLOOD CULTURE RECEIVED NO GROWTH TO DATE CULTURE WILL BE HELD FOR 5 DAYS BEFORE ISSUING A FINAL NEGATIVE REPORT     Performed at [facility]   Report Status PENDING   Incomplete  URINE CULTURE     Status: None   Collection Time    05/30/14  5:16 PM      Result Value Ref Range Status   Specimen Description URINE, CATHETERIZED   Final   Special Requests NONE   Final   Culture  Setup Time     Final   Value: 05/31/2014 02:09     Performed at [place]     Final   Value: NO GROWTH  Performed at [facility]     Final   Value: NO GROWTH     Performed at [facility]   Report Status 06/01/2014 FINAL   Final  CLOSTRIDIUM DIFFICILE BY PCR     Status: None   Collection Time    06/01/14  8:27 AM      Result Value Ref Range Status   C difficile by pcr NEGATIVE  NEGATIVE Final       Studies: Ct Chest W Contrast  06/01/2014   CLINICAL DATA:  Fever of unknown origin.  EXAM: CT CHEST, ABDOMEN, AND PELVIS WITH CONTRAST  TECHNIQUE: Multidetector CT imaging of the chest, abdomen and pelvis was performed following the standard protocol during bolus administration of intravenous contrast.  CONTRAST:  18mL OMNIPAQUE IOHEXOL 300 MG/ML  SOLN  COMPARISON:  CT scans dated 12/29/2010  FINDINGS: CT CHEST FINDINGS  There is  a large left effusion and a slightly smaller with moderate right pleural effusion with secondary slight compressive atelectasis in both lower lobes. Overall heart size is normal. No acute osseous abnormality. Pulmonary vascularity appears normal.  CT ABDOMEN AND PELVIS FINDINGS  There is a 13 x 3 cm foreign body in the fundus and body of the stomach of unknown origin. Gastrostomy tube appears in good position.  The patient has a small amount of ascites with edema in the soft tissues around the fundus of the stomach. The small intestine and colon appear normal including the terminal ileum and appendix. There is a moderate amount of stool in the rectum. There is slight rectal prolapse.  The liver appears normal. Numerous surgical clips in the gallbladder fossa. No dilated bile ducts. The spleen is normal. There is soft tissue stranding around the distal body and tail of the pancreas. The possibility of pancreatitis should be considered.  The patient has chronic atrophy of the left kidney although the renal cortex has increased in the thickness since the prior CT scan. There is chronic dilatation of the left renal pelvis of the ureter is not dilated. There is compensatory hypertrophy of the right kidney.  Uterus ovaries are normal.  The patient has edema in the subcutaneous soft tissues consistent with anasarca. This could also represent cellulitis. The subcutaneous edema as asymmetric to the left side of the abdomen. The patient has low albumin in this probably accounts for the anasarca hand the ascites and probably the soft tissue stranding around the pancreas. Is the patient's lipase normal?  IMPRESSION: 1. Large left pleural effusion and moderate right pleural effusion. 2. Unusual foreign body in the stomach measuring 13 x 3 cm. 3. Ascites and anasarca with slight soft tissue stranding around the pancreas which could represent mild pancreatitis.   Electronically Signed   By: Rozetta Nunnery M.D.   On: 06/01/2014 20:40    Ct Abdomen Pelvis W Contrast  06/01/2014   CLINICAL DATA:  Fever of unknown origin.  EXAM: CT CHEST, ABDOMEN, AND PELVIS WITH CONTRAST  TECHNIQUE: Multidetector CT imaging of the chest, abdomen and pelvis was performed following the standard protocol during bolus administration of intravenous contrast.  CONTRAST:  20mL OMNIPAQUE IOHEXOL 300 MG/ML  SOLN  COMPARISON:  CT scans dated 12/29/2010  FINDINGS: CT CHEST FINDINGS  There is a large left effusion and a slightly smaller with moderate right pleural effusion with secondary slight compressive atelectasis in both lower lobes. Overall heart size is normal. No acute osseous abnormality. Pulmonary vascularity appears normal.  CT ABDOMEN AND PELVIS  FINDINGS  There is a 13 x 3 cm foreign body in the fundus and body of the stomach of unknown origin. Gastrostomy tube appears in good position.  The patient has a small amount of ascites with edema in the soft tissues around the fundus of the stomach. The small intestine and colon appear normal including the terminal ileum and appendix. There is a moderate amount of stool in the rectum. There is slight rectal prolapse.  The liver appears normal. Numerous surgical clips in the gallbladder fossa. No dilated bile ducts. The spleen is normal. There is soft tissue stranding around the distal body and tail of the pancreas. The possibility of pancreatitis should be considered.  The patient has chronic atrophy of the left kidney although the renal cortex has increased in the thickness since the prior CT scan. There is chronic dilatation of the left renal pelvis of the ureter is not dilated. There is compensatory hypertrophy of the right kidney.  Uterus ovaries are normal.  The patient has edema in the subcutaneous soft tissues consistent with anasarca. This could also represent cellulitis. The subcutaneous edema as asymmetric to the left side of the abdomen. The patient has low albumin in this probably accounts for the anasarca  hand the ascites and probably the soft tissue stranding around the pancreas. Is the patient's lipase normal?  IMPRESSION: 1. Large left pleural effusion and moderate right pleural effusion. 2. Unusual foreign body in the stomach measuring 13 x 3 cm. 3. Ascites and anasarca with slight soft tissue stranding around the pancreas which could represent mild pancreatitis.   Electronically Signed   By: Rozetta Nunnery M.D.   On: 06/01/2014 20:40        Scheduled Meds: . antiseptic oral rinse  7 mL Mouth Rinse BID  . chlorhexidine  15 mL Mouth Rinse BID  . docusate  200 mg Per Tube BID  . feeding supplement (JEVITY 1.2 CAL)  1,000 mL Per Tube Q24H  . free water  200 mL Per Tube 4 times per day  . levETIRAcetam  500 mg Per Tube Q0600  . levETIRAcetam  750 mg Per Tube BID  . levothyroxine  75 mcg Per Tube QAC breakfast  . Valproic Acid  750 mg Per Tube TID   Continuous Infusions: . sodium chloride 50 mL/hr at 06/02/14 3086    Active Problems:   Seizure disorder   MR (mental retardation)   Hypothyroidism   HLD (hyperlipidemia)   Hypotension   Dehydration   Anemia   Diastolic heart failure, NYHA class 2   Hypothermia   Sepsis   Paroxysmal a-fib   Dysphasia   UTI (lower urinary tract infection)   Tooth decay   Hypernatremia    Time spent: 40 minutes.    Vernell Leep, MD, FACP, FHM. [facility] Pager [phone]  If 7PM-7AM, please contact night-coverage [email or web address] Password [email or web address] 06/02/2014, 10:45 AM    LOS: 19 days

## 2014-06-02 NOTE — Consult Note (Signed)
Unassigned patient Reason for Consult: Foreign body in stomach.  Referring Physician: Dr. Marshall Cork Dawn [name] is an 68 y.o. female.  HPI: 68 year old, black female with mental retardation, cerebral palsy, diastolic HF, dysphagia and feeding difficulties, initially admitted with E. Coli UTI/pyleneprhritis induced sepsis, finished a course of antibiotics and developed FUO 3 days ago. So far her work up has been unrevealing. CT scan incidentally found a large foreign body in the stomach, 13 x 3 cm, along with pleural effusions. After speaking to her sister, who happened to be at her bedside and is also her gaurdian, I was told that she has had a history of "putting things in her mouth" but as she has been living in a group home recently, she was not sure if she had swallowed any foreign objects. Patient is not able to provide any details on her history.  Past Medical History  Diagnosis Date  . Seizures   . Osteoporosis   . Thyroid disease     hypothyroid  . High cholesterol   . Cataracts, bilateral   . Optic atrophy   . Paraparesis     mild  . Hypothyroidism   . Anginal pain   . Coronary artery disease    Past Surgical History  Procedure Laterality Date  . Midline incision    . Central venous catheter insertion  05/15/2014        Family History  Problem Relation Age of Onset  . Colon cancer Mother   . Diabetes type II Sister    Social History:  reports that she has never smoked. She has never used smokeless tobacco. She reports that she does not drink alcohol or use illicit drugs.  Allergies:  Allergies  Allergen Reactions  . Carvedilol   . Ppd [Tuberculin Purified Protein Derivative]     Per MAR   Medications: I have reviewed the patient's current medications.  Results for orders placed during the hospital encounter of 05/14/14 (from the past 48 hour(s))  CBC     Status: Abnormal   Collection Time    06/01/14  6:57 AM      Result Value Ref Range   WBC 18.9  (*) 4.0 - 10.5 K/uL   RBC 2.61 (*) 3.87 - 5.11 MIL/uL   Hemoglobin 7.9 (*) 12.0 - 15.0 g/dL   HCT 24.3 (*) 36.0 - 46.0 %   MCV 93.1  78.0 - 100.0 fL   MCH 30.3  26.0 - 34.0 pg   MCHC 32.5  30.0 - 36.0 g/dL   RDW 17.3 (*) 11.5 - 15.5 %   Platelets 276  150 - 400 K/uL  CLOSTRIDIUM DIFFICILE BY PCR     Status: None   Collection Time    06/01/14  8:27 AM      Result Value Ref Range   C difficile by pcr NEGATIVE  NEGATIVE  TSH     Status: Abnormal   Collection Time    06/02/14  6:14 AM      Result Value Ref Range   TSH 12.960 (*) 0.350 - 4.500 uIU/mL   Ct Chest W Contrast  06/01/2014   CLINICAL DATA:  Fever of unknown origin.  EXAM: CT CHEST, ABDOMEN, AND PELVIS WITH CONTRAST  TECHNIQUE: Multidetector CT imaging of the chest, abdomen and pelvis was performed following the standard protocol during bolus administration of intravenous contrast.  CONTRAST:  70mL OMNIPAQUE IOHEXOL 300 MG/ML  SOLN  COMPARISON:  CT scans dated 12/29/2010  FINDINGS: CT  CHEST FINDINGS  There is a large left effusion and a slightly smaller with moderate right pleural effusion with secondary slight compressive atelectasis in both lower lobes. Overall heart size is normal. No acute osseous abnormality. Pulmonary vascularity appears normal.  CT ABDOMEN AND PELVIS FINDINGS  There is a 13 x 3 cm foreign body in the fundus and body of the stomach of unknown origin. Gastrostomy tube appears in good position.  The patient has a small amount of ascites with edema in the soft tissues around the fundus of the stomach. The small intestine and colon appear normal including the terminal ileum and appendix. There is a moderate amount of stool in the rectum. There is slight rectal prolapse.  The liver appears normal. Numerous surgical clips in the gallbladder fossa. No dilated bile ducts. The spleen is normal. There is soft tissue stranding around the distal body and tail of the pancreas. The possibility of pancreatitis should be considered.   The patient has chronic atrophy of the left kidney although the renal cortex has increased in the thickness since the prior CT scan. There is chronic dilatation of the left renal pelvis of the ureter is not dilated. There is compensatory hypertrophy of the right kidney.  Uterus ovaries are normal.  The patient has edema in the subcutaneous soft tissues consistent with anasarca. This could also represent cellulitis. The subcutaneous edema as asymmetric to the left side of the abdomen. The patient has low albumin in this probably accounts for the anasarca hand the ascites and probably the soft tissue stranding around the pancreas. Is the patient's lipase normal?  IMPRESSION: 1. Large left pleural effusion and moderate right pleural effusion. 2. Unusual foreign body in the stomach measuring 13 x 3 cm. 3. Ascites and anasarca with slight soft tissue stranding around the pancreas which could represent mild pancreatitis.   Electronically Signed   By: Rozetta Nunnery M.D.   On: 06/01/2014 20:40   Ct Abdomen Pelvis W Contrast  06/01/2014   CLINICAL DATA:  Fever of unknown origin.  EXAM: CT CHEST, ABDOMEN, AND PELVIS WITH CONTRAST  TECHNIQUE: Multidetector CT imaging of the chest, abdomen and pelvis was performed following the standard protocol during bolus administration of intravenous contrast.  CONTRAST:  64mL OMNIPAQUE IOHEXOL 300 MG/ML  SOLN  COMPARISON:  CT scans dated 12/29/2010  FINDINGS: CT CHEST FINDINGS  There is a large left effusion and a slightly smaller with moderate right pleural effusion with secondary slight compressive atelectasis in both lower lobes. Overall heart size is normal. No acute osseous abnormality. Pulmonary vascularity appears normal.  CT ABDOMEN AND PELVIS FINDINGS  There is a 13 x 3 cm foreign body in the fundus and body of the stomach of unknown origin. Gastrostomy tube appears in good position.  The patient has a small amount of ascites with edema in the soft tissues around the fundus of  the stomach. The small intestine and colon appear normal including the terminal ileum and appendix. There is a moderate amount of stool in the rectum. There is slight rectal prolapse.  The liver appears normal. Numerous surgical clips in the gallbladder fossa. No dilated bile ducts. The spleen is normal. There is soft tissue stranding around the distal body and tail of the pancreas. The possibility of pancreatitis should be considered.  The patient has chronic atrophy of the left kidney although the renal cortex has increased in the thickness since the prior CT scan. There is chronic dilatation of the left renal pelvis of  the ureter is not dilated. There is compensatory hypertrophy of the right kidney.  Uterus ovaries are normal.  The patient has edema in the subcutaneous soft tissues consistent with anasarca. This could also represent cellulitis. The subcutaneous edema as asymmetric to the left side of the abdomen. The patient has low albumin in this probably accounts for the anasarca hand the ascites and probably the soft tissue stranding around the pancreas. Is the patient's lipase normal?  IMPRESSION: 1. Large left pleural effusion and moderate right pleural effusion. 2. Unusual foreign body in the stomach measuring 13 x 3 cm. 3. Ascites and anasarca with slight soft tissue stranding around the pancreas which could represent mild pancreatitis.   Electronically Signed   By: Rozetta Nunnery M.D.   On: 06/01/2014 20:40   Review of Systems  Unable to perform ROS  Blood pressure 97/71, pulse 118, temperature 99.7 F (37.6 C), temperature source Oral, resp. rate 21, height 5' 1.81" (1.57 m), weight 64.7 kg (142 lb 10.2 oz), SpO2 96.00%. Physical Exam  Constitutional: She appears well-developed and well-nourished.  Cardiovascular: Normal rate and regular rhythm.   Respiratory: Breath sounds normal.  GI: Soft. Bowel sounds are normal. She exhibits no distension and no mass. There is no tenderness. There is no  rebound and no guarding.  PEG noted in the epigastric region  Skin: Skin is warm and dry.   Assessment/Plan: 1) FUO-Etiology unclear: will proceed with an EGD tomorrow. I have discussed this with her gaurdian and have had her sign a consent for an EGD. I am not sure what this foreign body is but once it is evaluated endoscopically, further decisions will be made about what should be done about it.  2) Feeding difficulties: PEG in place. 3) Ascites and anasarca on CT.  Abelino Tippin 06/02/2014, 4:14 PM

## 2014-06-02 NOTE — Progress Notes (Signed)
[facility]  Date of Admission:  05/14/2014  Antibiotics: none  Subjective: CT done  Objective: Temp:  [97.6 F (36.4 C)-99.7 F (37.6 C)] 99.7 F (37.6 C) (08/19 0951) Pulse Rate:  [105-118] 118 (08/19 0951) Resp:  [20-22] 21 (08/19 0951) BP: (97-135)/(58-72) 97/71 mmHg (08/19 0951) SpO2:  [93 %-98 %] 96 % (08/19 0951) Weight:  [142 lb 10.2 oz (64.7 kg)] 142 lb 10.2 oz (64.7 kg) (08/18 2028)  General: awake, nad Skin: no rashes Lungs: diminished Cor: RRR Abdomen: soft, nt, PEG in place Ext: contracted  Lab Results Lab Results  Component Value Date   WBC 18.9* 06/01/2014   HGB 7.9* 06/01/2014   HCT 24.3* 06/01/2014   MCV 93.1 06/01/2014   PLT 276 06/01/2014    Lab Results  Component Value Date   CREATININE 0.58 05/31/2014   BUN 7 05/31/2014   NA 141 05/31/2014   K 3.9 05/31/2014   CL 109 05/31/2014   CO2 22 05/31/2014    Lab Results  Component Value Date   ALT 9 05/31/2014   AST 13 05/31/2014   ALKPHOS 200* 05/31/2014   BILITOT 0.3 05/31/2014      Microbiology: Recent Results (from the past 240 hour(s))  URINE CULTURE     Status: None   Collection Time    05/25/14  9:47 AM      Result Value Ref Range Status   Specimen Description URINE, CATHETERIZED   Final   Special Requests NONE   Final   Culture  Setup Time     Final   Value: 05/25/2014 17:20     Performed at [place]     Final   Value: NO GROWTH     Performed at [facility]   Culture     Final   Value: NO GROWTH     Performed at [facility]   Report Status 05/26/2014 FINAL   Final  CULTURE, BLOOD (ROUTINE X 2)     Status: None   Collection Time    05/25/14 12:15 PM      Result Value Ref Range Status   Specimen Description BLOOD LEFT HAND   Final   Special Requests BOTTLES DRAWN AEROBIC AND ANAEROBIC 10CC   Final   Culture  Setup Time     Final   Value: 05/25/2014 16:18     Performed at [facility]   Culture      Final   Value: NO GROWTH 5 DAYS     Performed at [facility]   Report Status 05/31/2014 FINAL   Final  CULTURE, BLOOD (ROUTINE X 2)     Status: None   Collection Time    05/25/14 12:27 PM      Result Value Ref Range Status   Specimen Description BLOOD RIGHT HAND   Final   Special Requests BOTTLES DRAWN AEROBIC AND ANAEROBIC 10CC   Final   Culture  Setup Time     Final   Value: 05/25/2014 16:18     Performed at [facility]   Culture     Final   Value: NO GROWTH 5 DAYS     Performed at [facility]   Report Status 05/31/2014 FINAL   Final  CATH TIP CULTURE     Status: None   Collection Time    05/25/14  3:30 PM      Result Value Ref Range Status  Specimen Description CATH TIP   Final   Special Requests LEFT SUBCLAVIAN   Final   Culture     Final   Value: NO GROWTH 2 DAYS     Performed at [facility]   Report Status 05/28/2014 FINAL   Final  CULTURE, BLOOD (ROUTINE X 2)     Status: None   Collection Time    05/30/14 12:00 PM      Result Value Ref Range Status   Specimen Description BLOOD RIGHT ARM   Final   Special Requests BOTTLES DRAWN AEROBIC AND ANAEROBIC 10CC   Final   Culture  Setup Time     Final   Value: 05/30/2014 19:00     Performed at [facility]   Culture     Final   Value:        BLOOD CULTURE RECEIVED NO GROWTH TO DATE CULTURE WILL BE HELD FOR 5 DAYS BEFORE ISSUING A FINAL NEGATIVE REPORT     Performed at [facility]   Report Status PENDING   Incomplete  CULTURE, BLOOD (ROUTINE X 2)     Status: None   Collection Time    05/30/14 12:10 PM      Result Value Ref Range Status   Specimen Description BLOOD RIGHT HAND   Final   Special Requests BOTTLES DRAWN AEROBIC AND ANAEROBIC 5CC   Final   Culture  Setup Time     Final   Value: 05/30/2014 19:00     Performed at [facility]   Culture     Final   Value:        BLOOD CULTURE RECEIVED NO GROWTH TO DATE CULTURE WILL BE HELD FOR 5 DAYS BEFORE ISSUING A  FINAL NEGATIVE REPORT     Performed at [facility]   Report Status PENDING   Incomplete  URINE CULTURE     Status: None   Collection Time    05/30/14  5:16 PM      Result Value Ref Range Status   Specimen Description URINE, CATHETERIZED   Final   Special Requests NONE   Final   Culture  Setup Time     Final   Value: 05/31/2014 02:09     Performed at [place]     Final   Value: NO GROWTH     Performed at [facility]   Culture     Final   Value: NO GROWTH     Performed at [facility]   Report Status 06/01/2014 FINAL   Final  CLOSTRIDIUM DIFFICILE BY PCR     Status: None   Collection Time    06/01/14  8:27 AM      Result Value Ref Range Status   C difficile by pcr NEGATIVE  NEGATIVE Final    Studies/Results: Ct Chest W Contrast  06/01/2014   CLINICAL DATA:  Fever of unknown origin.  EXAM: CT CHEST, ABDOMEN, AND PELVIS WITH CONTRAST  TECHNIQUE: Multidetector CT imaging of the chest, abdomen and pelvis was performed following the standard protocol during bolus administration of intravenous contrast.  CONTRAST:  84mL OMNIPAQUE IOHEXOL 300 MG/ML  SOLN  COMPARISON:  CT scans dated 12/29/2010  FINDINGS: CT CHEST FINDINGS  There is a large left effusion and a slightly smaller with moderate right pleural effusion with secondary slight compressive atelectasis in both lower lobes. Overall heart size is normal. No acute osseous abnormality. Pulmonary vascularity appears normal.  CT ABDOMEN AND PELVIS FINDINGS  There is a 13 x 3 cm foreign body in the fundus and body of the stomach of unknown origin. Gastrostomy tube appears in good position.  The patient has a small amount of ascites with edema in the soft tissues around the fundus of the stomach. The small intestine and colon appear normal including the terminal ileum and appendix. There is a moderate amount of stool in the rectum. There is slight rectal prolapse.  The liver appears normal.  Numerous surgical clips in the gallbladder fossa. No dilated bile ducts. The spleen is normal. There is soft tissue stranding around the distal body and tail of the pancreas. The possibility of pancreatitis should be considered.  The patient has chronic atrophy of the left kidney although the renal cortex has increased in the thickness since the prior CT scan. There is chronic dilatation of the left renal pelvis of the ureter is not dilated. There is compensatory hypertrophy of the right kidney.  Uterus ovaries are normal.  The patient has edema in the subcutaneous soft tissues consistent with anasarca. This could also represent cellulitis. The subcutaneous edema as asymmetric to the left side of the abdomen. The patient has low albumin in this probably accounts for the anasarca hand the ascites and probably the soft tissue stranding around the pancreas. Is the patient's lipase normal?  IMPRESSION: 1. Large left pleural effusion and moderate right pleural effusion. 2. Unusual foreign body in the stomach measuring 13 x 3 cm. 3. Ascites and anasarca with slight soft tissue stranding around the pancreas which could represent mild pancreatitis.   Electronically Signed   By: Rozetta Nunnery M.D.   On: 06/01/2014 20:40   Ct Abdomen Pelvis W Contrast  06/01/2014   CLINICAL DATA:  Fever of unknown origin.  EXAM: CT CHEST, ABDOMEN, AND PELVIS WITH CONTRAST  TECHNIQUE: Multidetector CT imaging of the chest, abdomen and pelvis was performed following the standard protocol during bolus administration of intravenous contrast.  CONTRAST:  63mL OMNIPAQUE IOHEXOL 300 MG/ML  SOLN  COMPARISON:  CT scans dated 12/29/2010  FINDINGS: CT CHEST FINDINGS  There is a large left effusion and a slightly smaller with moderate right pleural effusion with secondary slight compressive atelectasis in both lower lobes. Overall heart size is normal. No acute osseous abnormality. Pulmonary vascularity appears normal.  CT ABDOMEN AND PELVIS FINDINGS   There is a 13 x 3 cm foreign body in the fundus and body of the stomach of unknown origin. Gastrostomy tube appears in good position.  The patient has a small amount of ascites with edema in the soft tissues around the fundus of the stomach. The small intestine and colon appear normal including the terminal ileum and appendix. There is a moderate amount of stool in the rectum. There is slight rectal prolapse.  The liver appears normal. Numerous surgical clips in the gallbladder fossa. No dilated bile ducts. The spleen is normal. There is soft tissue stranding around the distal body and tail of the pancreas. The possibility of pancreatitis should be considered.  The patient has chronic atrophy of the left kidney although the renal cortex has increased in the thickness since the prior CT scan. There is chronic dilatation of the left renal pelvis of the ureter is not dilated. There is compensatory hypertrophy of the right kidney.  Uterus ovaries are normal.  The patient has edema in the subcutaneous soft tissues consistent with anasarca. This could also represent cellulitis. The subcutaneous edema as asymmetric  to the left side of the abdomen. The patient has low albumin in this probably accounts for the anasarca hand the ascites and probably the soft tissue stranding around the pancreas. Is the patient's lipase normal?  IMPRESSION: 1. Large left pleural effusion and moderate right pleural effusion. 2. Unusual foreign body in the stomach measuring 13 x 3 cm. 3. Ascites and anasarca with slight soft tissue stranding around the pancreas which could represent mild pancreatitis.   Electronically Signed   By: Rozetta Nunnery M.D.   On: 06/01/2014 20:40    Assessment/Plan: 1) fever - afebrile now.  Will continue to watch.  Significant leukocytosis though.  Pulmonary effusions? FB?.  Could we tap the pleural effusion?    2) FB - not sure what this is.  GI to see.    Scharlene Gloss, [facility] [place] [email or web address] [phone] pager   [phone] cell 06/02/2014, 2:06 PM

## 2014-06-03 ENCOUNTER — Encounter (HOSPITAL_COMMUNITY): Payer: Self-pay | Admitting: *Deleted

## 2014-06-03 ENCOUNTER — Encounter (HOSPITAL_COMMUNITY)
Admission: EM | Disposition: A | Discharge status: Skilled Nursing Facility | Payer: Medicare Other | Source: Home / Self Care | Attending: Internal Medicine

## 2014-06-03 HISTORY — PX: ESOPHAGOGASTRODUODENOSCOPY: SHX5428

## 2014-06-03 LAB — CBC WITH DIFFERENTIAL/PLATELET
Basophils Absolute: 0 10*3/uL (ref 0.0–0.1)
Basophils Relative: 0 % (ref 0–1)
EOS ABS: 0 10*3/uL (ref 0.0–0.7)
Eosinophils Relative: 0 % (ref 0–5)
HCT: 22.2 % — ABNORMAL LOW (ref 36.0–46.0)
HEMOGLOBIN: 7.5 g/dL — AB (ref 12.0–15.0)
Lymphocytes Relative: 10 % — ABNORMAL LOW (ref 12–46)
Lymphs Abs: 1.9 10*3/uL (ref 0.7–4.0)
MCH: 30.9 pg (ref 26.0–34.0)
MCHC: 33.8 g/dL (ref 30.0–36.0)
MCV: 91.4 fL (ref 78.0–100.0)
Monocytes Absolute: 3 10*3/uL — ABNORMAL HIGH (ref 0.1–1.0)
Monocytes Relative: 15 % — ABNORMAL HIGH (ref 3–12)
NEUTROS PCT: 75 % (ref 43–77)
Neutro Abs: 14.6 10*3/uL — ABNORMAL HIGH (ref 1.7–7.7)
PLATELETS: 250 10*3/uL (ref 150–400)
RBC: 2.43 MIL/uL — ABNORMAL LOW (ref 3.87–5.11)
RDW: 17.4 % — ABNORMAL HIGH (ref 11.5–15.5)
WBC: 19.5 10*3/uL — AB (ref 4.0–10.5)

## 2014-06-03 LAB — BASIC METABOLIC PANEL
Anion gap: 11 (ref 5–15)
BUN: 7 mg/dL (ref 6–23)
CO2: 25 mEq/L (ref 19–32)
Calcium: 8 mg/dL — ABNORMAL LOW (ref 8.4–10.5)
Chloride: 113 mEq/L — ABNORMAL HIGH (ref 96–112)
Creatinine, Ser: 0.58 mg/dL (ref 0.50–1.10)
Glucose, Bld: 80 mg/dL (ref 70–99)
POTASSIUM: 3.4 meq/L — AB (ref 3.7–5.3)
Sodium: 149 mEq/L — ABNORMAL HIGH (ref 137–147)

## 2014-06-03 LAB — GLUCOSE, CAPILLARY
GLUCOSE-CAPILLARY: 71 mg/dL (ref 70–99)
GLUCOSE-CAPILLARY: 65 mg/dL — AB (ref 70–99)
GLUCOSE-CAPILLARY: 76 mg/dL (ref 70–99)
Glucose-Capillary: 90 mg/dL (ref 70–99)
Glucose-Capillary: 106 mg/dL — ABNORMAL HIGH (ref 70–99)

## 2014-06-03 SURGERY — EGD (ESOPHAGOGASTRODUODENOSCOPY)
Anesthesia: Moderate Sedation

## 2014-06-03 MED ORDER — DEXTROSE 50 % IV SOLN
25.0000 mL | Freq: Once | INTRAVENOUS | Status: AC | PRN
  Administered 2014-06-03: 25 mL via INTRAVENOUS

## 2014-06-03 MED ORDER — DIPHENHYDRAMINE HCL 50 MG/ML IJ SOLN
INTRAMUSCULAR | Status: AC
  Filled 2014-06-03: qty 1

## 2014-06-03 MED ORDER — SODIUM CHLORIDE 0.9 % IV SOLN: INTRAVENOUS | Status: DC

## 2014-06-03 MED ORDER — FENTANYL CITRATE 0.05 MG/ML IJ SOLN
INTRAMUSCULAR | Status: DC | PRN
  Administered 2014-06-03 (×2): 25 ug via INTRAVENOUS

## 2014-06-03 MED ORDER — AMOXICILLIN-POT CLAVULANATE 600-42.9 MG/5ML PO SUSR
875.0000 mg | Freq: Two times a day (BID) | ORAL | Status: DC
  Administered 2014-06-03 – 2014-06-09 (×12): 875 mg
  Filled 2014-06-03 (×14): qty 7.3

## 2014-06-03 MED ORDER — POTASSIUM CHLORIDE 20 MEQ/15ML (10%) PO LIQD
20.0000 meq | Freq: Once | ORAL | Status: AC
  Administered 2014-06-03: 20 meq
  Filled 2014-06-03: qty 15

## 2014-06-03 MED ORDER — DEXTROSE 50 % IV SOLN
INTRAVENOUS | Status: AC
  Administered 2014-06-03: 25 mL via INTRAVENOUS
  Filled 2014-06-03: qty 50

## 2014-06-03 MED ORDER — MIDAZOLAM HCL 10 MG/2ML IJ SOLN
INTRAMUSCULAR | Status: DC | PRN
  Administered 2014-06-03: 2 mg via INTRAVENOUS
  Administered 2014-06-03: 1 mg via INTRAVENOUS
  Administered 2014-06-03: 2 mg via INTRAVENOUS

## 2014-06-03 MED ORDER — MIDAZOLAM HCL 5 MG/ML IJ SOLN
INTRAMUSCULAR | Status: AC
  Filled 2014-06-03: qty 2

## 2014-06-03 MED ORDER — FENTANYL CITRATE 0.05 MG/ML IJ SOLN
INTRAMUSCULAR | Status: AC
  Filled 2014-06-03: qty 2

## 2014-06-03 NOTE — Progress Notes (Addendum)
PROGRESS NOTE    Dawn [name] GSU:[id number] DOB: 1946-08-09 DOA: 05/14/2014 PCP: [name]  HPI/Brief narrative 68 y.o. 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 Culture grew E coli and she completed IV antibiotics. Around 8/16 began to develop fever, had an episode of hypotension. Work up with CXR, UA, has been unrevealing. Line removed and blood cultures have remained negative. Minimal loose stool but C diff neg. Got a PEG tube for feeding. Has a history too of recurrent infections and hydronephrosis, left kidney atrophy. History of cholecystectomy. ID consulted 8/18.   Assessment/Plan:  Sepsis / septic shock due to pan sensitive E coli UTI  -She completed a 7 day abx course for UTI  - 8/16 overnight patient became hypotensive, spiked a fever and became tachycardic so was bolused 1L NS and normal saline at 13ml/hr  -FU blood and urine cx's remain negative - persistent Leukocytosis. -PCXR persistent LLL infiltrate and improved aeration RLL  -So far antibiotics have been held. C diff is negative. No clear source of fever.  - ID consultation 8/18 appreciated. CT chest shows b/l pleural effusions L>R. CT Abdomen shows unusual FB in stomach and ? Mild pancreatitis. GI/Dr. Collene Mares consulted 8/19 & plans for EGD 8/20 to evaluate. - Persistent leukocytosis, low grade fever 99 range. Discussed with Dr. Linus Salmons- suggests diagnostic thoracentesis- discussed with sister Ms. [name]- agrees. Will consult IR for same and can go post EGD.  Right facial droop  -non contrasted CT Head normal 8/4 - suspect this is her baseline facies   Cerebral palsy / Acute metabolic encephalopathy / dysphagia/Seizure disorder  -has chronic dysphagia  -Has abdominal binder post PEG procedure  - Tolerating PEG  feeds @ 64ml/hour - Continue Keppra and Depacon  - Keppra level: 50.3 & Valproate level: 75.1  Hypernatremia with dehydration  -Na had normalized only to increase 8/20, s/p a dose of IV lasix 8/19. Hold further lasix. Continue free water via PEG and check BMP in am. ?? Lab error. - now volume overloaded/3rd spacing.   Hypokalemia  -replace as needed and follow.  Symptomatic anemia  -8/1 transfused 3 units PRBC - Hgb drifting down but not obvious gross blood loss - follow trend - stable over last 72 hours.  Thrombocytopenia  -Resolved  Chronic Diastolic heart failure/Anasarca/b/l Pleural effusions  - appears volume overloaded clinically and on CT. Not on diuretics PTA. s/p single dose of IV lasix on 8/19. Incontinent and cant monitor I/O. Edema seems less. Hold further Lasix due to Hypernatremia.  Paroxysmal atrial fibrillation  -Currrently sinus tachycardia. Not on tele.  Hypocalcemia  -Corrected calcium normal   FUO - as above. TSH elevated and hence not cause of this. We'll be difficult to get lower extremity venous Doppler secondary to contractures but will try to rule out DVT- pending.  Hypothyroid - Increased Synthroid to 88 mcg daily. Followup TSH in 4-6 weeks.   Code Status: DNR Family Communication: Discussed with sister, Ms. [name]- updated care on 8/20. Disposition Plan:  SNF when medically stable- not stable for DC.   Consultants:  IR- Dr. Kathlene Cote  GI/Dr. Collene Mares  ID- Dr. Scharlene Gloss.  Procedures:  8/14 gastrostomy tube placed 47fr   Antibiotics: Zosyn 7/31>> stopped 8/03  Vancomycin 7/31>> stopped 8/03  Rocephin 8/03 >> stopped 8/07  Rocephin 8/11 > stopped 8/13   Subjective: Non verbal.  As per nursing, no acute changes. Urinary incontinence.  Objective: Filed Vitals:   06/02/14 1840 06/02/14 2043 06/03/14 0519 06/03/14 1000  BP: 98/55 100/53 115/56 111/50  Pulse: 101 105 101 106  Temp: 99.4 F (37.4 C) 98.9 F (37.2 C) 98.1 F  (36.7 C) 98 F (36.7 C)  TempSrc: Oral Oral Oral Oral  Resp: 20 20 20 20   Height:      Weight:  64.7 kg (142 lb 10.2 oz)    SpO2: 96% 96% 96% 98%    Intake/Output Summary (Last 24 hours) at 06/03/14 1034 Last data filed at 06/03/14 0900  Gross per 24 hour  Intake      0 ml  Output      0 ml  Net      0 ml   Filed Weights   05/31/14 0459 06/01/14 2028 06/02/14 2043  Weight: 64.7 kg (142 lb 10.2 oz) 64.7 kg (142 lb 10.2 oz) 64.7 kg (142 lb 10.2 oz)     Exam:  General exam: Moderately built and poorly nourished female lying comfortably in bed. Respiratory system: Poor inspiratory effort but reduced breath sounds in the bases & occasional basal crackles. Rest of lung fields clear to auscultation. No increased work of breathing. Cardiovascular system: S1 & S2 heard, regular tachycardic. No JVD, murmurs, gallops, clicks. 1+ bilateral leg edema. Gastrointestinal system: Abdomen is nondistended, soft and nontender. Normal bowel sounds heard. PEG tube site without acute findings. Midline laparotomy scar. Central nervous system: Alert but nonverbal and does not respond to questions Extremities: Moves upper extremities spontaneously. Contractures of bilateral lower extremities.   Data Reviewed: Basic Metabolic Panel:  Recent Labs Lab 05/30/14 0409 05/31/14 0520 06/03/14 0500  NA 140 141 149*  K 4.0 3.9 3.4*  CL 107 109 113*  CO2 23 22 25   GLUCOSE 163* 128* 80  BUN 6 7 7   CREATININE 0.62 0.58 0.58  CALCIUM 8.0* 7.7* 8.0*   Liver Function Tests:  Recent Labs Lab 05/30/14 0409 05/31/14 0520  AST 17 13  ALT 11 9  ALKPHOS 258* 200*  BILITOT 0.4 0.3  PROT 6.4 6.1  ALBUMIN 1.3* 1.2*   No results found for this basename: LIPASE, AMYLASE,  in the last 168 hours No results found for this basename: AMMONIA,  in the last 168 hours CBC:  Recent Labs Lab 05/30/14 0409 05/31/14 0520 06/01/14 0657 06/03/14 0500  WBC 18.5* 19.3* 18.9* 19.5*  NEUTROABS 12.4* 12.9*  --   14.6*  HGB 8.6* 7.4* 7.9* 7.5*  HCT 26.0* 23.1* 24.3* 22.2*  MCV 94.5 92.4 93.1 91.4  PLT 257 244 276 250   Cardiac Enzymes: No results found for this basename: CKTOTAL, CKMB, CKMBINDEX, TROPONINI,  in the last 168 hours BNP (last 3 results)  Recent Labs  10/28/13 1547  PROBNP 488.2*   CBG:  Recent Labs Lab 05/27/14 1235 06/03/14 0823  GLUCAP 92 90    Recent Results (from the past 240 hour(s))  URINE CULTURE     Status: None   Collection Time    05/25/14  9:47 AM      Result Value Ref Range Status   Specimen Description URINE, CATHETERIZED   Final   Special Requests NONE   Final   Culture  Setup Time     Final   Value: 05/25/2014 17:20     Performed at [facility]     Final   Value: NO GROWTH     Performed at  [facility]     Final   Value: NO GROWTH     Performed at [facility]   Report Status 05/26/2014 FINAL   Final  CULTURE, BLOOD (ROUTINE X 2)     Status: None   Collection Time    05/25/14 12:15 PM      Result Value Ref Range Status   Specimen Description BLOOD LEFT HAND   Final   Special Requests BOTTLES DRAWN AEROBIC AND ANAEROBIC 10CC   Final   Culture  Setup Time     Final   Value: 05/25/2014 16:18     Performed at [facility]   Culture     Final   Value: NO GROWTH 5 DAYS     Performed at [facility]   Report Status 05/31/2014 FINAL   Final  CULTURE, BLOOD (ROUTINE X 2)     Status: None   Collection Time    05/25/14 12:27 PM      Result Value Ref Range Status   Specimen Description BLOOD RIGHT HAND   Final   Special Requests BOTTLES DRAWN AEROBIC AND ANAEROBIC 10CC   Final   Culture  Setup Time     Final   Value: 05/25/2014 16:18     Performed at [facility]   Culture     Final   Value: NO GROWTH 5 DAYS     Performed at [facility]   Report Status 05/31/2014 FINAL   Final  CATH TIP CULTURE     Status: None   Collection Time    05/25/14  3:30 PM       Result Value Ref Range Status   Specimen Description CATH TIP   Final   Special Requests LEFT SUBCLAVIAN   Final   Culture     Final   Value: NO GROWTH 2 DAYS     Performed at [facility]   Report Status 05/28/2014 FINAL   Final  CULTURE, BLOOD (ROUTINE X 2)     Status: None   Collection Time    05/30/14 12:00 PM      Result Value Ref Range Status   Specimen Description BLOOD RIGHT ARM   Final   Special Requests BOTTLES DRAWN AEROBIC AND ANAEROBIC 10CC   Final   Culture  Setup Time     Final   Value: 05/30/2014 19:00     Performed at [facility]   Culture     Final   Value:        BLOOD CULTURE RECEIVED NO GROWTH TO DATE CULTURE WILL BE HELD FOR 5 DAYS BEFORE ISSUING A FINAL NEGATIVE REPORT     Performed at [facility]   Report Status PENDING   Incomplete  CULTURE, BLOOD (ROUTINE X 2)     Status: None   Collection Time    05/30/14 12:10 PM      Result Value Ref Range Status   Specimen Description BLOOD RIGHT HAND   Final   Special Requests BOTTLES DRAWN AEROBIC AND ANAEROBIC 5CC   Final   Culture  Setup Time     Final   Value: 05/30/2014 19:00     Performed at [facility]   Culture     Final   Value:        BLOOD CULTURE RECEIVED NO GROWTH TO DATE CULTURE WILL BE HELD FOR 5 DAYS BEFORE ISSUING A FINAL NEGATIVE REPORT  Performed at [facility]   Report Status PENDING   Incomplete  URINE CULTURE     Status: None   Collection Time    05/30/14  5:16 PM      Result Value Ref Range Status   Specimen Description URINE, CATHETERIZED   Final   Special Requests NONE   Final   Culture  Setup Time     Final   Value: 05/31/2014 02:09     Performed at [facility]     Final   Value: NO GROWTH     Performed at [facility]   Culture     Final   Value: NO GROWTH     Performed at [facility]   Report Status 06/01/2014 FINAL   Final  CLOSTRIDIUM DIFFICILE BY PCR     Status: None   Collection Time      06/01/14  8:27 AM      Result Value Ref Range Status   C difficile by pcr NEGATIVE  NEGATIVE Final       Studies: Ct Chest W Contrast  06/01/2014   CLINICAL DATA:  Fever of unknown origin.  EXAM: CT CHEST, ABDOMEN, AND PELVIS WITH CONTRAST  TECHNIQUE: Multidetector CT imaging of the chest, abdomen and pelvis was performed following the standard protocol during bolus administration of intravenous contrast.  CONTRAST:  57mL OMNIPAQUE IOHEXOL 300 MG/ML  SOLN  COMPARISON:  CT scans dated 12/29/2010  FINDINGS: CT CHEST FINDINGS  There is a large left effusion and a slightly smaller with moderate right pleural effusion with secondary slight compressive atelectasis in both lower lobes. Overall heart size is normal. No acute osseous abnormality. Pulmonary vascularity appears normal.  CT ABDOMEN AND PELVIS FINDINGS  There is a 13 x 3 cm foreign body in the fundus and body of the stomach of unknown origin. Gastrostomy tube appears in good position.  The patient has a small amount of ascites with edema in the soft tissues around the fundus of the stomach. The small intestine and colon appear normal including the terminal ileum and appendix. There is a moderate amount of stool in the rectum. There is slight rectal prolapse.  The liver appears normal. Numerous surgical clips in the gallbladder fossa. No dilated bile ducts. The spleen is normal. There is soft tissue stranding around the distal body and tail of the pancreas. The possibility of pancreatitis should be considered.  The patient has chronic atrophy of the left kidney although the renal cortex has increased in the thickness since the prior CT scan. There is chronic dilatation of the left renal pelvis of the ureter is not dilated. There is compensatory hypertrophy of the right kidney.  Uterus ovaries are normal.  The patient has edema in the subcutaneous soft tissues consistent with anasarca. This could also represent cellulitis. The subcutaneous edema as  asymmetric to the left side of the abdomen. The patient has low albumin in this probably accounts for the anasarca hand the ascites and probably the soft tissue stranding around the pancreas. Is the patient's lipase normal?  IMPRESSION: 1. Large left pleural effusion and moderate right pleural effusion. 2. Unusual foreign body in the stomach measuring 13 x 3 cm. 3. Ascites and anasarca with slight soft tissue stranding around the pancreas which could represent mild pancreatitis.   Electronically Signed   By: Rozetta Nunnery M.D.   On: 06/01/2014 20:40   Ct Abdomen Pelvis W Contrast  06/01/2014  CLINICAL DATA:  Fever of unknown origin.  EXAM: CT CHEST, ABDOMEN, AND PELVIS WITH CONTRAST  TECHNIQUE: Multidetector CT imaging of the chest, abdomen and pelvis was performed following the standard protocol during bolus administration of intravenous contrast.  CONTRAST:  65mL OMNIPAQUE IOHEXOL 300 MG/ML  SOLN  COMPARISON:  CT scans dated 12/29/2010  FINDINGS: CT CHEST FINDINGS  There is a large left effusion and a slightly smaller with moderate right pleural effusion with secondary slight compressive atelectasis in both lower lobes. Overall heart size is normal. No acute osseous abnormality. Pulmonary vascularity appears normal.  CT ABDOMEN AND PELVIS FINDINGS  There is a 13 x 3 cm foreign body in the fundus and body of the stomach of unknown origin. Gastrostomy tube appears in good position.  The patient has a small amount of ascites with edema in the soft tissues around the fundus of the stomach. The small intestine and colon appear normal including the terminal ileum and appendix. There is a moderate amount of stool in the rectum. There is slight rectal prolapse.  The liver appears normal. Numerous surgical clips in the gallbladder fossa. No dilated bile ducts. The spleen is normal. There is soft tissue stranding around the distal body and tail of the pancreas. The possibility of pancreatitis should be considered.  The  patient has chronic atrophy of the left kidney although the renal cortex has increased in the thickness since the prior CT scan. There is chronic dilatation of the left renal pelvis of the ureter is not dilated. There is compensatory hypertrophy of the right kidney.  Uterus ovaries are normal.  The patient has edema in the subcutaneous soft tissues consistent with anasarca. This could also represent cellulitis. The subcutaneous edema as asymmetric to the left side of the abdomen. The patient has low albumin in this probably accounts for the anasarca hand the ascites and probably the soft tissue stranding around the pancreas. Is the patient's lipase normal?  IMPRESSION: 1. Large left pleural effusion and moderate right pleural effusion. 2. Unusual foreign body in the stomach measuring 13 x 3 cm. 3. Ascites and anasarca with slight soft tissue stranding around the pancreas which could represent mild pancreatitis.   Electronically Signed   By: Rozetta Nunnery M.D.   On: 06/01/2014 20:40        Scheduled Meds: . antiseptic oral rinse  7 mL Mouth Rinse BID  . aspirin  81 mg Per Tube Daily  . atorvastatin  10 mg Per Tube Daily  . chlorhexidine  15 mL Mouth Rinse BID  . docusate  200 mg Per Tube BID  . feeding supplement (JEVITY 1.2 CAL)  1,000 mL Per Tube Q24H  . free water  200 mL Per Tube 4 times per day  . levETIRAcetam  500 mg Per Tube Q0600  . levETIRAcetam  750 mg Per Tube BID  . levothyroxine  88 mcg Per Tube QAC breakfast  . potassium chloride  20 mEq Per Tube Once  . Valproic Acid  750 mg Per Tube TID   Continuous Infusions:    Principal Problem:   FUO (fever of unknown origin) Active Problems:   Seizure disorder   MR (mental retardation)   Hypothyroidism   HLD (hyperlipidemia)   Hypotension   Anemia   Diastolic heart failure, NYHA class 2   Paroxysmal a-fib   Dysphasia   UTI (lower urinary tract infection)   Tooth decay    Time spent: 40 minutes.    Vernell Leep, MD,  [place],  FHM. [facility] Pager [phone]  If 7PM-7AM, please contact night-coverage [email or web address] Password [email or web address] 06/03/2014, 10:34 AM    LOS: 20 days

## 2014-06-03 NOTE — Op Note (Addendum)
[facility] [place], [id number]   OPERATIVE PROCEDURE REPORT  PATIENT: Dawn [name], Dawn [name].  MR#: [id number] BIRTHDATE: January 14, 1946  GENDER: Female ENDOSCOPIST: Carol Ada, MD ASSISTANT:   Mahala Menghini, technician and Clemmie Krill, RN, BSN PROCEDURE DATE: 06/03/2014 PROCEDURE:   EGD w/ fb removal ASA CLASS:   Class III INDICATIONS:abnormal CT of the GI tract. MEDICATIONS: Versed 5 mg IV and Fentanyl 50 mcg IV TOPICAL ANESTHETIC:   none  DESCRIPTION OF PROCEDURE:   After the risks benefits and alternatives of the procedure were thoroughly explained, informed consent was obtained.  The Pentax Gastroscope [id number]  endoscope was introduced through the mouth  and advanced to the second portion of the duodenum Without limitations.      The instrument was slowly withdrawn as the mucosa was fully examined.      FINDINGS: Upon initial inspection the esophagus appear to be normal and patent.  In the gastric funduct a long 1.5 cm diameter dark foreign object was identified.  Additionally the internal PEG bumper was noted to be buried.  Inspection of the external bumper revealed that there was too much tension from the external bumper, which essentially caused an ulceration on the internal PEG site. Once I loosened the external bumper a copious amount of pus spilled out around the PEG site.  This is the origin of the fever.  The buried bumper was able to be pushed into the gastric lumen, which revealed the pressure ulcer.  No duodenal abnormalities were noted. My attention then turned back to the foreign object.  The distal end was captured with the snare and it was being drawn up into the esophagus, however, significant resistance was encountered.  After several failed attempts to gentlely pull the object through the esophagus the maneuver was abandoned.  Inspection of the esophagus revealed mucosal tears consistent with a mild stenosis.  It  is patent to for PO intake, but not wide enough to remove the object. I tried to cut the object into small her portions, but only two small portions were removed.  I was not able to cut through with the snare or endoscissors.  Finally I tried to push the object through the pylorus into the small bowel, but this was also a difficult maneuver.  At this point I stopped any further attempts to address the issue.   Retroflexed views revealed no abnormalities.     The scope was then withdrawn from the patient and the procedure terminated.  COMPLICATIONS: There were no complications.  IMPRESSION: 1) Hard rubber foreign object.  (I did remove a small portion of the object and identified it externally.  It most likely absorbed fluid and expended in size.) 2) Buried bumper  - Resolved. 3) Cellulitis around the PEG site.  Active drainage of pus.  RECOMMENDATIONS: 1) Augmentin 875 mg suspension BID. 2) Repeat imaging in the future to see if the object has spontaneously passed into the small  bowel. 3) DO NOT TOUCH the external bumper.  Leave the PEG tube loose.  DO DRESS the area.  _______________________________ eSigned:  Carol Ada, MD 06/03/2014 6:17 PM  Revised: 06/03/2014 6:17 PM    PATIENT NAME:  Dawn [name]. MR#: [id number]

## 2014-06-03 NOTE — Progress Notes (Signed)
[facility]  Date of Admission:  05/14/2014  Antibiotics: none  Subjective: EDG, throacentesis tomorrow  Objective: Temp:  [98 F (36.7 C)-99.4 F (37.4 C)] 98 F (36.7 C) (08/20 1000) Pulse Rate:  [101-106] 106 (08/20 1000) Resp:  [20-25] 25 (08/20 1605) BP: (92-115)/(40-56) 92/40 mmHg (08/20 1606) SpO2:  [94 %-98 %] 94 % (08/20 1606) Weight:  [142 lb 10.2 oz (64.7 kg)] 142 lb 10.2 oz (64.7 kg) (08/19 2043)  General: awake, nad Skin: no rashes Lungs: diminished Cor: RRR Abdomen: soft, nt, PEG in place Ext: contracted  Lab Results Lab Results  Component Value Date   WBC 19.5* 06/03/2014   HGB 7.5* 06/03/2014   HCT 22.2* 06/03/2014   MCV 91.4 06/03/2014   PLT 250 06/03/2014    Lab Results  Component Value Date   CREATININE 0.58 06/03/2014   BUN 7 06/03/2014   NA 149* 06/03/2014   K 3.4* 06/03/2014   CL 113* 06/03/2014   CO2 25 06/03/2014    Lab Results  Component Value Date   ALT 9 05/31/2014   AST 13 05/31/2014   ALKPHOS 200* 05/31/2014   BILITOT 0.3 05/31/2014      Microbiology: Recent Results (from the past 240 hour(s))  URINE CULTURE     Status: None   Collection Time    05/25/14  9:47 AM      Result Value Ref Range Status   Specimen Description URINE, CATHETERIZED   Final   Special Requests NONE   Final   Culture  Setup Time     Final   Value: 05/25/2014 17:20     Performed at [place]     Final   Value: NO GROWTH     Performed at [facility]   Culture     Final   Value: NO GROWTH     Performed at [facility]   Report Status 05/26/2014 FINAL   Final  CULTURE, BLOOD (ROUTINE X 2)     Status: None   Collection Time    05/25/14 12:15 PM      Result Value Ref Range Status   Specimen Description BLOOD LEFT HAND   Final   Special Requests BOTTLES DRAWN AEROBIC AND ANAEROBIC 10CC   Final   Culture  Setup Time     Final   Value: 05/25/2014 16:18     Performed at [facility]    Culture     Final   Value: NO GROWTH 5 DAYS     Performed at [facility]   Report Status 05/31/2014 FINAL   Final  CULTURE, BLOOD (ROUTINE X 2)     Status: None   Collection Time    05/25/14 12:27 PM      Result Value Ref Range Status   Specimen Description BLOOD RIGHT HAND   Final   Special Requests BOTTLES DRAWN AEROBIC AND ANAEROBIC 10CC   Final   Culture  Setup Time     Final   Value: 05/25/2014 16:18     Performed at [facility]   Culture     Final   Value: NO GROWTH 5 DAYS     Performed at [facility]   Report Status 05/31/2014 FINAL   Final  CATH TIP CULTURE     Status: None   Collection Time    05/25/14  3:30 PM      Result Value Ref Range Status  Specimen Description CATH TIP   Final   Special Requests LEFT SUBCLAVIAN   Final   Culture     Final   Value: NO GROWTH 2 DAYS     Performed at [facility]   Report Status 05/28/2014 FINAL   Final  CULTURE, BLOOD (ROUTINE X 2)     Status: None   Collection Time    05/30/14 12:00 PM      Result Value Ref Range Status   Specimen Description BLOOD RIGHT ARM   Final   Special Requests BOTTLES DRAWN AEROBIC AND ANAEROBIC 10CC   Final   Culture  Setup Time     Final   Value: 05/30/2014 19:00     Performed at [facility]   Culture     Final   Value:        BLOOD CULTURE RECEIVED NO GROWTH TO DATE CULTURE WILL BE HELD FOR 5 DAYS BEFORE ISSUING A FINAL NEGATIVE REPORT     Performed at [facility]   Report Status PENDING   Incomplete  CULTURE, BLOOD (ROUTINE X 2)     Status: None   Collection Time    05/30/14 12:10 PM      Result Value Ref Range Status   Specimen Description BLOOD RIGHT HAND   Final   Special Requests BOTTLES DRAWN AEROBIC AND ANAEROBIC 5CC   Final   Culture  Setup Time     Final   Value: 05/30/2014 19:00     Performed at [facility]   Culture     Final   Value:        BLOOD CULTURE RECEIVED NO GROWTH TO DATE CULTURE WILL BE HELD FOR 5 DAYS  BEFORE ISSUING A FINAL NEGATIVE REPORT     Performed at [facility]   Report Status PENDING   Incomplete  URINE CULTURE     Status: None   Collection Time    05/30/14  5:16 PM      Result Value Ref Range Status   Specimen Description URINE, CATHETERIZED   Final   Special Requests NONE   Final   Culture  Setup Time     Final   Value: 05/31/2014 02:09     Performed at [place]     Final   Value: NO GROWTH     Performed at [facility]   Culture     Final   Value: NO GROWTH     Performed at [facility]   Report Status 06/01/2014 FINAL   Final  CLOSTRIDIUM DIFFICILE BY PCR     Status: None   Collection Time    06/01/14  8:27 AM      Result Value Ref Range Status   C difficile by pcr NEGATIVE  NEGATIVE Final    Studies/Results: Ct Chest W Contrast  06/01/2014   CLINICAL DATA:  Fever of unknown origin.  EXAM: CT CHEST, ABDOMEN, AND PELVIS WITH CONTRAST  TECHNIQUE: Multidetector CT imaging of the chest, abdomen and pelvis was performed following the standard protocol during bolus administration of intravenous contrast.  CONTRAST:  67mL OMNIPAQUE IOHEXOL 300 MG/ML  SOLN  COMPARISON:  CT scans dated 12/29/2010  FINDINGS: CT CHEST FINDINGS  There is a large left effusion and a slightly smaller with moderate right pleural effusion with secondary slight compressive atelectasis in both lower lobes. Overall heart size is normal. No acute osseous abnormality. Pulmonary vascularity appears normal.  CT ABDOMEN AND PELVIS FINDINGS  There is a 13 x 3 cm foreign body in the fundus and body of the stomach of unknown origin. Gastrostomy tube appears in good position.  The patient has a small amount of ascites with edema in the soft tissues around the fundus of the stomach. The small intestine and colon appear normal including the terminal ileum and appendix. There is a moderate amount of stool in the rectum. There is slight rectal prolapse.  The liver  appears normal. Numerous surgical clips in the gallbladder fossa. No dilated bile ducts. The spleen is normal. There is soft tissue stranding around the distal body and tail of the pancreas. The possibility of pancreatitis should be considered.  The patient has chronic atrophy of the left kidney although the renal cortex has increased in the thickness since the prior CT scan. There is chronic dilatation of the left renal pelvis of the ureter is not dilated. There is compensatory hypertrophy of the right kidney.  Uterus ovaries are normal.  The patient has edema in the subcutaneous soft tissues consistent with anasarca. This could also represent cellulitis. The subcutaneous edema as asymmetric to the left side of the abdomen. The patient has low albumin in this probably accounts for the anasarca hand the ascites and probably the soft tissue stranding around the pancreas. Is the patient's lipase normal?  IMPRESSION: 1. Large left pleural effusion and moderate right pleural effusion. 2. Unusual foreign body in the stomach measuring 13 x 3 cm. 3. Ascites and anasarca with slight soft tissue stranding around the pancreas which could represent mild pancreatitis.   Electronically Signed   By: Rozetta Nunnery M.D.   On: 06/01/2014 20:40   Ct Abdomen Pelvis W Contrast  06/01/2014   CLINICAL DATA:  Fever of unknown origin.  EXAM: CT CHEST, ABDOMEN, AND PELVIS WITH CONTRAST  TECHNIQUE: Multidetector CT imaging of the chest, abdomen and pelvis was performed following the standard protocol during bolus administration of intravenous contrast.  CONTRAST:  15mL OMNIPAQUE IOHEXOL 300 MG/ML  SOLN  COMPARISON:  CT scans dated 12/29/2010  FINDINGS: CT CHEST FINDINGS  There is a large left effusion and a slightly smaller with moderate right pleural effusion with secondary slight compressive atelectasis in both lower lobes. Overall heart size is normal. No acute osseous abnormality. Pulmonary vascularity appears normal.  CT ABDOMEN AND  PELVIS FINDINGS  There is a 13 x 3 cm foreign body in the fundus and body of the stomach of unknown origin. Gastrostomy tube appears in good position.  The patient has a small amount of ascites with edema in the soft tissues around the fundus of the stomach. The small intestine and colon appear normal including the terminal ileum and appendix. There is a moderate amount of stool in the rectum. There is slight rectal prolapse.  The liver appears normal. Numerous surgical clips in the gallbladder fossa. No dilated bile ducts. The spleen is normal. There is soft tissue stranding around the distal body and tail of the pancreas. The possibility of pancreatitis should be considered.  The patient has chronic atrophy of the left kidney although the renal cortex has increased in the thickness since the prior CT scan. There is chronic dilatation of the left renal pelvis of the ureter is not dilated. There is compensatory hypertrophy of the right kidney.  Uterus ovaries are normal.  The patient has edema in the subcutaneous soft tissues consistent with anasarca. This could also represent cellulitis. The subcutaneous edema as asymmetric  to the left side of the abdomen. The patient has low albumin in this probably accounts for the anasarca hand the ascites and probably the soft tissue stranding around the pancreas. Is the patient's lipase normal?  IMPRESSION: 1. Large left pleural effusion and moderate right pleural effusion. 2. Unusual foreign body in the stomach measuring 13 x 3 cm. 3. Ascites and anasarca with slight soft tissue stranding around the pancreas which could represent mild pancreatitis.   Electronically Signed   By: Rozetta Nunnery M.D.   On: 06/01/2014 20:40    Assessment/Plan: 1) fever - afebrile now.  Will continue to watch.  Significant leukocytosis presists.  Thoracentesis tomorrow.   2) FB - not sure what this is.  GI to EGD tomorrow.    Scharlene Gloss, [facility] [place] [email or web address] [phone] pager   [phone] cell 06/03/2014, 4:49 PM

## 2014-06-03 NOTE — Progress Notes (Signed)
Bilateral lower extremity venous duplex has been ordered, the patient remains very contracted and we are unable to perform duplex. I have spoken with Cybill, RN and she will inform us if/when the patient relaxes her legs. I have discussed this with Dr.Hongalgi.  06/03/2014 11:37 AM Maudry Mayhew, RVT, RDCS, RDMS

## 2014-06-03 NOTE — Progress Notes (Signed)
BP 89/46, HR 103. Dr. Algis Liming notified. Will recheck and monitor.  Joellen Jersey, RN.

## 2014-06-04 ENCOUNTER — Encounter (HOSPITAL_COMMUNITY): Payer: Self-pay | Admitting: Gastroenterology

## 2014-06-04 DIAGNOSIS — IMO0002 Reserved for concepts with insufficient information to code with codable children: Secondary | ICD-10-CM

## 2014-06-04 DIAGNOSIS — T189XXA Foreign body of alimentary tract, part unspecified, initial encounter: Secondary | ICD-10-CM

## 2014-06-04 LAB — BASIC METABOLIC PANEL
ANION GAP: 9 (ref 5–15)
BUN: 9 mg/dL (ref 6–23)
CO2: 27 mEq/L (ref 19–32)
Calcium: 8 mg/dL — ABNORMAL LOW (ref 8.4–10.5)
Chloride: 115 mEq/L — ABNORMAL HIGH (ref 96–112)
Creatinine, Ser: 0.64 mg/dL (ref 0.50–1.10)
Glucose, Bld: 77 mg/dL (ref 70–99)
POTASSIUM: 3.7 meq/L (ref 3.7–5.3)
Sodium: 151 mEq/L — ABNORMAL HIGH (ref 137–147)

## 2014-06-04 LAB — GLUCOSE, CAPILLARY
GLUCOSE-CAPILLARY: 59 mg/dL — AB (ref 70–99)
Glucose-Capillary: 70 mg/dL (ref 70–99)
Glucose-Capillary: 84 mg/dL (ref 70–99)
Glucose-Capillary: 79 mg/dL (ref 70–99)
Glucose-Capillary: 164 mg/dL — ABNORMAL HIGH (ref 70–99)

## 2014-06-04 LAB — CBC WITH DIFFERENTIAL/PLATELET
Basophils Absolute: 0 10*3/uL (ref 0.0–0.1)
Basophils Relative: 0 % (ref 0–1)
Eosinophils Absolute: 0 10*3/uL (ref 0.0–0.7)
Eosinophils Relative: 0 % (ref 0–5)
HCT: 21.9 % — ABNORMAL LOW (ref 36.0–46.0)
Hemoglobin: 7.1 g/dL — ABNORMAL LOW (ref 12.0–15.0)
LYMPHS ABS: 1.8 10*3/uL (ref 0.7–4.0)
LYMPHS PCT: 15 % (ref 12–46)
MCH: 30.9 pg (ref 26.0–34.0)
MCHC: 32.4 g/dL (ref 30.0–36.0)
MCV: 95.2 fL (ref 78.0–100.0)
Monocytes Absolute: 1.6 10*3/uL — ABNORMAL HIGH (ref 0.1–1.0)
Monocytes Relative: 14 % — ABNORMAL HIGH (ref 3–12)
NEUTROS ABS: 8.4 10*3/uL — AB (ref 1.7–7.7)
NEUTROS PCT: 71 % (ref 43–77)
PLATELETS: 198 10*3/uL (ref 150–400)
RBC: 2.3 MIL/uL — AB (ref 3.87–5.11)
RDW: 17.8 % — ABNORMAL HIGH (ref 11.5–15.5)
WBC: 11.9 10*3/uL — ABNORMAL HIGH (ref 4.0–10.5)

## 2014-06-04 MED ORDER — DEXTROSE-NACL 5-0.45 % IV SOLN
INTRAVENOUS | Status: DC
Start: 2014-06-04 — End: 2014-06-05
  Administered 2014-06-04 – 2014-06-05 (×2): via INTRAVENOUS

## 2014-06-04 MED ORDER — DEXTROSE 50 % IV SOLN
25.0000 mL | Freq: Once | INTRAVENOUS | Status: AC | PRN
Start: 2014-06-04 — End: 2014-06-04
  Administered 2014-06-04: 25 mL via INTRAVENOUS

## 2014-06-04 MED ORDER — DEXTROSE 5 % IV SOLN
INTRAVENOUS | Status: AC
  Administered 2014-06-04: 1000 mL via INTRAVENOUS

## 2014-06-04 MED ORDER — DEXTROSE 50 % IV SOLN
INTRAVENOUS | Status: AC
  Filled 2014-06-04: qty 50

## 2014-06-04 MED ORDER — SODIUM CHLORIDE 0.45 % IV BOLUS: 250.0000 mL | Freq: Once | INTRAVENOUS | Status: DC

## 2014-06-04 NOTE — Progress Notes (Signed)
Dawn [name]:[id number] DOB: 1946-04-28 DOA: 05/14/2014 PCP: [name]   Subj: 68 y.o. BF PMHx of Seizures; diastolic CHF, atrial fibrillation, pituitary microadenoma (diagnosed 2009), hypothyroidism; High cholesterol and CAD. She also has developmental disability and at baseline non verbal and wheelchair bound status  She presented after staff at the group home noticed she had stopped eating for the past few days and seemed to be unable to swallow. No apparent fever or chills. She had been pocketing food in her mouth. Had dental visit the previous week. Reportedly she had similar episodes of these symptoms in the past and recovered.  Upon arrival to emerge department patient was noted to be hypotensive (SBP down to 70s). She was given IV fluids and after 2 L blood pressure improved to her baseline of 110s. Sister was at the bedside so the admitting MD discussed goals of care. Family wished for her to be limited code. Patient appeared to have urinary tract infection leading to sepsis. Zosyn and vancomycin had been started by the EDP. There was also concern over possible aspiration although chest x-ray was unremarkable. She had been on mechanical soft diet for years with thin fluids but for several days prior to presentation she was switched to nectar thick due to concerns of active aspiration.dysphagia progression. For the 24 hours prior to presenatation she had stopped eating and drinking altogether. Per group home 2 weeks ago patient was treated for mild pneumonia.  8./21 patient patient's bedside secondary to hypotension   Obj: Objective: VITAL SIGNS: Temp: 97.7 F (36.5 C) (08/21 2017) Temp src: Oral (08/21 2017) BP: 76/39 mmHg (08/21 2017) Pulse Rate: 81 (08/21 2017) SPO2; FIO2:   Intake/Output Summary (Last 24 hours) at 06/04/14 2144 Last data filed at 06/04/14 1855  Gross per 24 hour  Intake    675 ml  Output      0 ml  Net    675 ml     Exam: General: Awake,  noncommunicative, no respiratory distress  Lungs: Clear to auscultation bilaterally, RA  Cardiovascular: Tachycardic, Regular rhythm without gallop or rub- 2/6 systolic murmur RSB 2nd ICS  Abdomen: Nontender, nondistended, soft, bowel sounds positive, no rebound, no ascites, no appreciable mass , gastrostomy tube present negative sign of infection or leak  Extremities: No significant cyanosis, clubbing - minimal pedal edema which appears chronic - mittens in place      Procedure/Significant Events:    Culture   Antibiotics:    A/P Hypotension -Increased patient's D5-0.45% normal saline to 225 ml/hr  Hypernatremia -See hypotension

## 2014-06-04 NOTE — Progress Notes (Signed)
PROGRESS NOTE    Dawn [name] EZM:[id number] DOB: [birth year] DOA: 05/14/2014 PCP: [name]  HPI/Brief narrative 68 y.o. 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 Culture grew E coli and she completed IV antibiotics. Around 8/16 began to develop fever, had an episode of hypotension. Work up with CXR, UA, has been unrevealing. Line removed and blood cultures have remained negative. Minimal loose stool but C diff neg. Got a PEG tube for feeding. Has a history too of recurrent infections and hydronephrosis, left kidney atrophy. History of cholecystectomy. ID consulted 8/18.   Assessment/Plan:  Sepsis / septic shock due to pan sensitive E coli UTI  -She completed a 7 day abx course for UTI  - Patient developed fever couple days back and initially clear source could not be identified. Please see discussion below under FUO.  Right facial droop  -non contrasted CT Head normal 8/4 - suspect this is her baseline facies   Cerebral palsy / Acute metabolic encephalopathy / dysphagia/Seizure disorder  -has chronic dysphagia  -Has abdominal binder post PEG procedure  - Tolerating PEG feeds @ 52ml/hour - Continue Keppra and Depacon  - Keppra level: 50.3 & Valproate level: 75.1  Hypernatremia with dehydration  -Na had normalized only to increase 8/20, s/p a dose of IV lasix 8/19. Hold further lasix. Continue free water via PEG and check BMP in am. Sodium has continued to increase to 151. Of note patient has not been resumed on PEG tube feeding post EGD yesterday afternoon. Starting D5 water and resume PEG feeding with free water via tube. Monitor BMP closely. - now volume overloaded/3rd spacing.   Hypokalemia  -replace as needed and follow.  Symptomatic anemia  -8/1 transfused 3 units PRBC  - Hgb drifting down but not obvious gross blood loss - follow trend - stable over last 72 hours. Transfuse if Hb < 7.  Thrombocytopenia  -Resolved  Chronic Diastolic heart failure/Anasarca/b/l Pleural effusions  - appears volume overloaded clinically and on CT. Not on diuretics PTA. s/p single dose of IV lasix on 8/19. Incontinent and cant monitor I/O. Edema seems less. Hold further Lasix due to Hypernatremia.  Paroxysmal atrial fibrillation  -Currrently sinus SR.   Hypocalcemia  -Corrected calcium normal   FUO - as above. TSH elevated and hence not cause of this. May be secondary to PEG site infection-started on Augmentin. Temperatures have settled and leukocytosis has improved. We'll hold earlier plans for thoracentesis.  Foreign body in the stomach - Seen on EGD. GI hopeful for spontaneous passage. Periodic imaging to follow.  Hypothyroid - Increased Synthroid to 88 mcg daily. Followup TSH in 4-6 weeks.   Code Status: DNR Family Communication: Discussed with sister, Ms. [name]- updated care on 8/20. Disposition Plan:  SNF when medically stable- not stable for DC.   Consultants:  IR- Dr. Kathlene Cote  GI/Dr. Collene Mares  ID- Dr. Scharlene Gloss.  Procedures:  8/14 gastrostomy tube placed 38fr  EGD 8/20   Antibiotics: Zosyn 7/31>> stopped 8/03  Vancomycin 7/31>> stopped 8/03  Rocephin 8/03 >> stopped 8/07  Rocephin 8/11 > stopped 8/13 Augmentin 8/20 >   Subjective: Non verbal. As per nursing, no acute changes.  Objective: Filed Vitals:   06/04/14 0056 06/04/14 0444 06/04/14 0947 06/04/14 1635  BP: 94/55 86/51 92/38  75/27  Pulse: 100 91 93 87  Temp: 100 F (37.8 C) 98.7  F (37.1 C) 98.7 F (37.1 C) 98.7 F (37.1 C)  TempSrc: Oral Oral Oral Oral  Resp: 32 22 24 21   Height:      Weight:      SpO2: 93% 99% 98% 99%    Intake/Output Summary (Last 24 hours) at 06/04/14 1716 Last data filed at 06/04/14 0900  Gross per 24 hour  Intake    300 ml  Output       0 ml  Net    300 ml   Filed Weights   05/31/14 0459 06/01/14 2028 06/02/14 2043  Weight: 64.7 kg (142 lb 10.2 oz) 64.7 kg (142 lb 10.2 oz) 64.7 kg (142 lb 10.2 oz)     Exam:  General exam: Moderately built and poorly nourished female lying comfortably in bed. Respiratory system: Poor inspiratory effort & reduced breath sounds in the bases. Rest of lung fields clear to auscultation. No increased work of breathing. Cardiovascular system: S1 & S2 heard, RRR. No JVD, murmurs, gallops, clicks. 1+ bilateral leg edema. Gastrointestinal system: Abdomen is nondistended, soft and nontender. Normal bowel sounds heard. PEG tube site with some purulent drainage. Midline laparotomy scar. Central nervous system: Alert but nonverbal and does not respond to questions Extremities: Moves upper extremities spontaneously. Contractures of bilateral lower extremities.   Data Reviewed: Basic Metabolic Panel:  Recent Labs Lab 05/30/14 0409 05/31/14 0520 06/03/14 0500 06/04/14 0635  NA 140 141 149* 151*  K 4.0 3.9 3.4* 3.7  CL 107 109 113* 115*  CO2 23 22 25 27   GLUCOSE 163* 128* 80 77  BUN 6 7 7 9   CREATININE 0.62 0.58 0.58 0.64  CALCIUM 8.0* 7.7* 8.0* 8.0*   Liver Function Tests:  Recent Labs Lab 05/30/14 0409 05/31/14 0520  AST 17 13  ALT 11 9  ALKPHOS 258* 200*  BILITOT 0.4 0.3  PROT 6.4 6.1  ALBUMIN 1.3* 1.2*   No results found for this basename: LIPASE, AMYLASE,  in the last 168 hours No results found for this basename: AMMONIA,  in the last 168 hours CBC:  Recent Labs Lab 05/30/14 0409 05/31/14 0520 06/01/14 0657 06/03/14 0500 06/04/14 0635  WBC 18.5* 19.3* 18.9* 19.5* 11.9*  NEUTROABS 12.4* 12.9*  --  14.6* 8.4*  HGB 8.6* 7.4* 7.9* 7.5* 7.1*  HCT 26.0* 23.1* 24.3* 22.2* 21.9*  MCV 94.5 92.4 93.1 91.4 95.2  PLT 257 244 276 250 198   Cardiac Enzymes: No results found for this basename: CKTOTAL, CKMB, CKMBINDEX, TROPONINI,  in the last 168 hours BNP (last 3  results)  Recent Labs  10/28/13 1547  PROBNP 488.2*   CBG:  Recent Labs Lab 06/03/14 1616 06/03/14 1836 06/04/14 0827 06/04/14 1155 06/04/14 1634  GLUCAP 106* 76 70 84 79    Recent Results (from the past 240 hour(s))  CULTURE, BLOOD (ROUTINE X 2)     Status: None   Collection Time    05/30/14 12:00 PM      Result Value Ref Range Status   Specimen Description BLOOD RIGHT ARM   Final   Special Requests BOTTLES DRAWN AEROBIC AND ANAEROBIC 10CC   Final   Culture  Setup Time     Final   Value: 05/30/2014 19:00     Performed at [facility]   Culture     Final   Value:        BLOOD CULTURE RECEIVED NO GROWTH TO DATE CULTURE WILL BE HELD FOR 5 DAYS BEFORE ISSUING A FINAL NEGATIVE REPORT  Performed at [facility]   Report Status PENDING   Incomplete  CULTURE, BLOOD (ROUTINE X 2)     Status: None   Collection Time    05/30/14 12:10 PM      Result Value Ref Range Status   Specimen Description BLOOD RIGHT HAND   Final   Special Requests BOTTLES DRAWN AEROBIC AND ANAEROBIC 5CC   Final   Culture  Setup Time     Final   Value: 05/30/2014 19:00     Performed at [facility]   Culture     Final   Value:        BLOOD CULTURE RECEIVED NO GROWTH TO DATE CULTURE WILL BE HELD FOR 5 DAYS BEFORE ISSUING A FINAL NEGATIVE REPORT     Performed at [facility]   Report Status PENDING   Incomplete  URINE CULTURE     Status: None   Collection Time    05/30/14  5:16 PM      Result Value Ref Range Status   Specimen Description URINE, CATHETERIZED   Final   Special Requests NONE   Final   Culture  Setup Time     Final   Value: 05/31/2014 02:09     Performed at [facility]     Final   Value: NO GROWTH     Performed at [facility]   Culture     Final   Value: NO GROWTH     Performed at [facility]   Report Status 06/01/2014 FINAL   Final  CLOSTRIDIUM DIFFICILE BY PCR     Status: None   Collection Time     06/01/14  8:27 AM      Result Value Ref Range Status   C difficile by pcr NEGATIVE  NEGATIVE Final       Studies: No results found.      Scheduled Meds: . amoxicillin-clavulanate  875 mg Per Tube BID  . antiseptic oral rinse  7 mL Mouth Rinse BID  . aspirin  81 mg Per Tube Daily  . atorvastatin  10 mg Per Tube Daily  . chlorhexidine  15 mL Mouth Rinse BID  . docusate  200 mg Per Tube BID  . feeding supplement (JEVITY 1.2 CAL)  1,000 mL Per Tube Q24H  . free water  200 mL Per Tube 4 times per day  . levETIRAcetam  500 mg Per Tube Q0600  . levETIRAcetam  750 mg Per Tube BID  . levothyroxine  88 mcg Per Tube QAC breakfast  . Valproic Acid  750 mg Per Tube TID   Continuous Infusions: . dextrose 75 mL/hr at 06/04/14 1510    Principal Problem:   FUO (fever of unknown origin) Active Problems:   Seizure disorder   MR (mental retardation)   Hypothyroidism   HLD (hyperlipidemia)   Hypotension   Anemia   Diastolic heart failure, NYHA class 2   Paroxysmal a-fib   Dysphasia   UTI (lower urinary tract infection)   Tooth decay    Time spent: 40 minutes.    Vernell Leep, MD, FACP, FHM. [facility] Pager [phone]  If 7PM-7AM, please contact night-coverage [email or web address] Password [facility] 06/04/2014, 5:16 PM    LOS: 21 days

## 2014-06-04 NOTE — Progress Notes (Signed)
Subjective: No acute events.  Tmax 100.  Objective: Vital signs in last 24 hours: Temp:  [98 F (36.7 C)-100 F (37.8 C)] 98.7 F (37.1 C) (08/21 0444) Pulse Rate:  [91-107] 91 (08/21 0444) Resp:  [18-32] 22 (08/21 0444) BP: (71-125)/(10-73) 86/51 mmHg (08/21 0444) SpO2:  [93 %-99 %] 99 % (08/21 0444) Last BM Date: 06/04/14  Intake/Output from previous day: 08/20 0701 - 08/21 0700 In: 300 [I.V.:300] Out: -  Intake/Output this shift:    General appearance: contracted state, nonverbal, sleeping GI: mild ulceration at the external bumper, no pus drainage  Lab Results:  Recent Labs  06/03/14 0500  WBC 19.5*  HGB 7.5*  HCT 22.2*  PLT 250   BMET  Recent Labs  06/03/14 0500  NA 149*  K 3.4*  CL 113*  CO2 25  GLUCOSE 80  BUN 7  CREATININE 0.58  CALCIUM 8.0*   LFT No results found for this basename: PROT, ALBUMIN, AST, ALT, ALKPHOS, BILITOT, BILIDIR, IBILI,  in the last 72 hours PT/INR No results found for this basename: LABPROT, INR,  in the last 72 hours Hepatitis Panel No results found for this basename: HEPBSAG, HCVAB, HEPAIGM, HEPBIGM,  in the last 72 hours C-Diff No results found for this basename: CDIFFTOX,  in the last 72 hours Fecal Lactopherrin No results found for this basename: FECLLACTOFRN,  in the last 72 hours  Studies/Results: No results found.  Medications:  Scheduled: . amoxicillin-clavulanate  875 mg Per Tube BID  . antiseptic oral rinse  7 mL Mouth Rinse BID  . aspirin  81 mg Per Tube Daily  . atorvastatin  10 mg Per Tube Daily  . chlorhexidine  15 mL Mouth Rinse BID  . docusate  200 mg Per Tube BID  . feeding supplement (JEVITY 1.2 CAL)  1,000 mL Per Tube Q24H  . free water  200 mL Per Tube 4 times per day  . levETIRAcetam  500 mg Per Tube Q0600  . levETIRAcetam  750 mg Per Tube BID  . levothyroxine  88 mcg Per Tube QAC breakfast  . Valproic Acid  750 mg Per Tube TID   Continuous:   Assessment/Plan: 1) PEG site  infection. 2) Buried PEG bumper - resolved. 3) Persistent foreign body in the gastric lumen.   Hopefully the PEG site will respond to Augmentin.  It looks good at this time.  In the next week the external bumper can be moved down to the 7-8 cm marker, but not any lower.  She has an ulceration on the gastric mucosal side that needs to heal.  As for the foreign body it may be able to pass spontaneously through the intestines.  I cannot remove it endoscopically as it has expanded, which I explained in my EGD note.  I doubt elective surgery is an option with her prior history of abdominal surgery.   Plan: 1) Monitor PEG site. 2) Continue supportive care.  LOS: 21 days   Tiron Suski D 06/04/2014, 7:08 AM

## 2014-06-04 NOTE — Progress Notes (Signed)
[facility]  Date of Admission:  05/14/2014  Antibiotics: Augmentin  Subjective: EGD done  Objective: Temp:  [98.3 F (36.8 C)-100 F (37.8 C)] 98.7 F (37.1 C) (08/21 0947) Pulse Rate:  [91-107] 93 (08/21 0947) Resp:  [18-32] 24 (08/21 0947) BP: (71-125)/(10-73) 92/38 mmHg (08/21 0947) SpO2:  [93 %-99 %] 98 % (08/21 0947)  General: awake, nad Skin: no rashes Lungs: diminished   Lab Results Lab Results  Component Value Date   WBC 11.9* 06/04/2014   HGB 7.1* 06/04/2014   HCT 21.9* 06/04/2014   MCV 95.2 06/04/2014   PLT 198 06/04/2014    Lab Results  Component Value Date   CREATININE 0.64 06/04/2014   BUN 9 06/04/2014   NA 151* 06/04/2014   K 3.7 06/04/2014   CL 115* 06/04/2014   CO2 27 06/04/2014    Lab Results  Component Value Date   ALT 9 05/31/2014   AST 13 05/31/2014   ALKPHOS 200* 05/31/2014   BILITOT 0.3 05/31/2014      Microbiology: Recent Results (from the past 240 hour(s))  CULTURE, BLOOD (ROUTINE X 2)     Status: None   Collection Time    05/25/14 12:15 PM      Result Value Ref Range Status   Specimen Description BLOOD LEFT HAND   Final   Special Requests BOTTLES DRAWN AEROBIC AND ANAEROBIC 10CC   Final   Culture  Setup Time     Final   Value: 05/25/2014 16:18     Performed at [facility]   Culture     Final   Value: NO GROWTH 5 DAYS     Performed at [facility]   Report Status 05/31/2014 FINAL   Final  CULTURE, BLOOD (ROUTINE X 2)     Status: None   Collection Time    05/25/14 12:27 PM      Result Value Ref Range Status   Specimen Description BLOOD RIGHT HAND   Final   Special Requests BOTTLES DRAWN AEROBIC AND ANAEROBIC 10CC   Final   Culture  Setup Time     Final   Value: 05/25/2014 16:18     Performed at [facility]   Culture     Final   Value: NO GROWTH 5 DAYS     Performed at [facility]   Report Status 05/31/2014 FINAL   Final  CATH TIP CULTURE     Status: None   Collection Time    05/25/14  3:30 PM      Result Value Ref Range Status   Specimen Description CATH TIP   Final   Special Requests LEFT SUBCLAVIAN   Final   Culture     Final   Value: NO GROWTH 2 DAYS     Performed at [facility]   Report Status 05/28/2014 FINAL   Final  CULTURE, BLOOD (ROUTINE X 2)     Status: None   Collection Time    05/30/14 12:00 PM      Result Value Ref Range Status   Specimen Description BLOOD RIGHT ARM   Final   Special Requests BOTTLES DRAWN AEROBIC AND ANAEROBIC 10CC   Final   Culture  Setup Time     Final   Value: 05/30/2014 19:00     Performed at [facility]   Culture     Final   Value:        BLOOD CULTURE RECEIVED NO  GROWTH TO DATE CULTURE WILL BE HELD FOR 5 DAYS BEFORE ISSUING A FINAL NEGATIVE REPORT     Performed at [facility]   Report Status PENDING   Incomplete  CULTURE, BLOOD (ROUTINE X 2)     Status: None   Collection Time    05/30/14 12:10 PM      Result Value Ref Range Status   Specimen Description BLOOD RIGHT HAND   Final   Special Requests BOTTLES DRAWN AEROBIC AND ANAEROBIC 5CC   Final   Culture  Setup Time     Final   Value: 05/30/2014 19:00     Performed at [facility]   Culture     Final   Value:        BLOOD CULTURE RECEIVED NO GROWTH TO DATE CULTURE WILL BE HELD FOR 5 DAYS BEFORE ISSUING A FINAL NEGATIVE REPORT     Performed at [facility]   Report Status PENDING   Incomplete  URINE CULTURE     Status: None   Collection Time    05/30/14  5:16 PM      Result Value Ref Range Status   Specimen Description URINE, CATHETERIZED   Final   Special Requests NONE   Final   Culture  Setup Time     Final   Value: 05/31/2014 02:09     Performed at [place]     Final   Value: NO GROWTH     Performed at [facility]   Culture     Final   Value: NO GROWTH     Performed at [facility]   Report Status 06/01/2014 FINAL   Final  CLOSTRIDIUM DIFFICILE  BY PCR     Status: None   Collection Time    06/01/14  8:27 AM      Result Value Ref Range Status   C difficile by pcr NEGATIVE  NEGATIVE Final    Studies/Results: No results found.  Assessment/Plan: 1) fever - continues to be afebrile.  WBC improved. Some pus around PEG. On augmentin.  FB evaluated and hopeful it will pass.   -continue with augmentin for 7 days.    Dr. Megan Salon available over the weekend if needed     Scharlene Gloss, [facility] [place] [email or web address] [phone] pager   [phone] cell 06/04/2014, 11:21 AM

## 2014-06-04 NOTE — Progress Notes (Addendum)
Called vs 89/45 hr 85,orders received for ns bolus. Will continue to monitor.Drainage remains around peg site . Area cleansed and gauze applied

## 2014-06-05 ENCOUNTER — Inpatient Hospital Stay (HOSPITAL_COMMUNITY): Payer: Medicare Other

## 2014-06-05 DIAGNOSIS — K9422 Gastrostomy infection: Secondary | ICD-10-CM

## 2014-06-05 LAB — CBC
HEMATOCRIT: 20.8 % — AB (ref 36.0–46.0)
Hemoglobin: 7 g/dL — ABNORMAL LOW (ref 12.0–15.0)
MCH: 30.8 pg (ref 26.0–34.0)
MCHC: 33.7 g/dL (ref 30.0–36.0)
MCV: 91.6 fL (ref 78.0–100.0)
Platelets: 170 10*3/uL (ref 150–400)
RBC: 2.27 MIL/uL — ABNORMAL LOW (ref 3.87–5.11)
RDW: 17.7 % — AB (ref 11.5–15.5)
WBC: 8.2 10*3/uL (ref 4.0–10.5)

## 2014-06-05 LAB — BASIC METABOLIC PANEL
Anion gap: 10 (ref 5–15)
BUN: 8 mg/dL (ref 6–23)
CALCIUM: 7.8 mg/dL — AB (ref 8.4–10.5)
CO2: 26 mEq/L (ref 19–32)
CREATININE: 0.49 mg/dL — AB (ref 0.50–1.10)
Chloride: 109 mEq/L (ref 96–112)
Glucose, Bld: 191 mg/dL — ABNORMAL HIGH (ref 70–99)
Potassium: 3.3 mEq/L — ABNORMAL LOW (ref 3.7–5.3)
Sodium: 145 mEq/L (ref 137–147)

## 2014-06-05 LAB — CULTURE, BLOOD (ROUTINE X 2)
CULTURE: NO GROWTH
Culture: NO GROWTH

## 2014-06-05 LAB — GLUCOSE, CAPILLARY
Glucose-Capillary: 108 mg/dL — ABNORMAL HIGH (ref 70–99)
Glucose-Capillary: 134 mg/dL — ABNORMAL HIGH (ref 70–99)
Glucose-Capillary: 143 mg/dL — ABNORMAL HIGH (ref 70–99)
Glucose-Capillary: 155 mg/dL — ABNORMAL HIGH (ref 70–99)
Glucose-Capillary: 159 mg/dL — ABNORMAL HIGH (ref 70–99)

## 2014-06-05 LAB — HEMOGLOBIN AND HEMATOCRIT, BLOOD
HEMATOCRIT: 31.5 % — AB (ref 36.0–46.0)
Hemoglobin: 10.9 g/dL — ABNORMAL LOW (ref 12.0–15.0)

## 2014-06-05 LAB — PREPARE RBC (CROSSMATCH)

## 2014-06-05 MED ORDER — SODIUM CHLORIDE 0.9 % IV SOLN: Freq: Once | INTRAVENOUS | Status: DC

## 2014-06-05 MED ORDER — POTASSIUM CHLORIDE 20 MEQ/15ML (10%) PO LIQD
40.0000 meq | Freq: Once | ORAL | Status: AC
  Administered 2014-06-05: 40 meq
  Filled 2014-06-05: qty 30

## 2014-06-05 NOTE — Progress Notes (Addendum)
PROGRESS NOTE    WINONA SISON ZOX:[id number] DOB: 1945-12-31 DOA: 05/14/2014 PCP: [name]  HPI/Brief narrative 68 y.o. 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 Culture grew E coli and she completed IV antibiotics. Around 8/16 began to develop fever, had an episode of hypotension. Work up with CXR, UA, has been unrevealing. Line removed and blood cultures have remained negative. Minimal loose stool but C diff neg. Got a PEG tube for feeding. Has a history too of recurrent infections and hydronephrosis, left kidney atrophy. History of cholecystectomy. ID consulted 8/18.   Assessment/Plan:  Sepsis / septic shock due to pan sensitive E coli UTI  -She completed a 7 day abx course for UTI  - Patient developed fever couple days back and initially clear source could not be identified. Please see discussion below under FUO.  Right facial droop  -non contrasted CT Head normal 8/4 - suspect this is her baseline facies   Cerebral palsy / Acute metabolic encephalopathy / dysphagia/Seizure disorder  - has chronic dysphagia  - treating for PEG site infection - Tolerating PEG feeds @ 76ml/hour - Continue Keppra and Depacon  - Keppra level: 50.3 & Valproate level: 75.1  Hypernatremia with dehydration  -Na had normalized only to increase 8/20, s/p a dose of IV lasix 8/19. Hold further lasix. Sodium continued to increase to 151. Improved after D5 water and resume PEG feeding with free water via tube. Continue free water via tube feeds. - now volume overloaded/3rd spacing.   Hypokalemia  -replace as needed and follow.  Symptomatic anemia  -8/1 transfused 3 units PRBC - Hgb drifting down but not obvious gross blood loss - follow trend - stable over last 72 hours. Hb 7. Transfuse 1 PRBC and  FU CBC  Thrombocytopenia  -Resolved  Chronic Diastolic heart failure/Anasarca/b/l Pleural effusions  - appears volume overloaded clinically and on CT. Not on diuretics PTA. s/p single dose of IV lasix on 8/19. Incontinent and cant monitor I/O. Edema seems less. Hold further Lasix due to Hypernatremia.  Paroxysmal atrial fibrillation  -Currrently sinus SR.   Hypocalcemia  -Corrected calcium normal   FUO/PEG site infection - as above. TSH elevated and hence not cause of this. May be secondary to PEG site infection-started on Augmentin. Temperatures have settled and leukocytosis has improved. We'll hold earlier plans for thoracentesis.  Foreign body in the stomach - Seen on EGD. GI hopeful for spontaneous passage. Periodic imaging to follow.  Hypothyroid - Increased Synthroid to 88 mcg daily. Followup TSH in 4-6 weeks.  Bilateral pleural effusions R > L. - Does not seem to be symptomatic currently. Worsened compared to chest x-ray 8/16. ? Thoracentesis versus monitor clinically and with serial x-rays. Discussed with family regarding aggressiveness of care.  Hypotension -  Secondary to intravascular volume depletion. Improved.    Code Status: DNR Family Communication: tried to call sister [name] 8/22 - no response and unable to leave voice message. Disposition Plan:  SNF when medically stable- not stable for DC.   Consultants:  IR- Dr. Kathlene Cote  GI/Dr. Collene Mares  ID- Dr. Scharlene Gloss.  Procedures:  8/14 gastrostomy tube placed 42fr  EGD 8/20   Antibiotics: Zosyn 7/31>> stopped 8/03  Vancomycin 7/31>> stopped 8/03  Rocephin 8/03 >> stopped 8/07  Rocephin 8/11 > stopped 8/13 Augmentin 8/20 >   Subjective: Non verbal.  Objective: Filed Vitals:   06/05/14 1315 06/05/14 1415 06/05/14 1445 06/05/14 1505  BP: 86/59 97/56 152/116 135/99  Pulse: 82 81  95  Temp: 98 F (36.7 C) 97.8 F (36.6 C)    TempSrc: Oral Oral  Oral  Resp:      Height:        Weight:      SpO2: 95% 95% 95% 97%    Intake/Output Summary (Last 24 hours) at 06/05/14 1652 Last data filed at 06/05/14 1505  Gross per 24 hour  Intake 2849.59 ml  Output      0 ml  Net 2849.59 ml   Filed Weights   05/31/14 0459 06/01/14 2028 06/02/14 2043  Weight: 64.7 kg (142 lb 10.2 oz) 64.7 kg (142 lb 10.2 oz) 64.7 kg (142 lb 10.2 oz)     Exam:  General exam: Moderately built and poorly nourished female looks much improved compared to yesterday. Mucosa moist. Respiratory system: Poor inspiratory effort. Reduced breath sounds in the bases R>L. Rest of lung fields clear to auscultation. No increased work of breathing. Cardiovascular system: S1 & S2 heard, RRR. No JVD, murmurs, gallops, clicks. 1+ bilateral leg edema. Gastrointestinal system: Abdomen is nondistended, soft and nontender. Normal bowel sounds heard. PEG tube site with some purulent drainage. Midline laparotomy scar. Central nervous system: Alert but nonverbal and does not respond to questions Extremities: Moves upper extremities spontaneously. Contractures of bilateral lower extremities.   Data Reviewed: Basic Metabolic Panel:  Recent Labs Lab 05/30/14 0409 05/31/14 0520 06/03/14 0500 06/04/14 0635 06/05/14 0449  NA 140 141 149* 151* 145  K 4.0 3.9 3.4* 3.7 3.3*  CL 107 109 113* 115* 109  CO2 23 22 25 27 26   GLUCOSE 163* 128* 80 77 191*  BUN 6 7 7 9 8   CREATININE 0.62 0.58 0.58 0.64 0.49*  CALCIUM 8.0* 7.7* 8.0* 8.0* 7.8*   Liver Function Tests:  Recent Labs Lab 05/30/14 0409 05/31/14 0520  AST 17 13  ALT 11 9  ALKPHOS 258* 200*  BILITOT 0.4 0.3  PROT 6.4 6.1  ALBUMIN 1.3* 1.2*   No results found for this basename: LIPASE, AMYLASE,  in the last 168 hours No results found for this basename: AMMONIA,  in the last 168 hours CBC:  Recent Labs Lab 05/30/14 0409 05/31/14 0520 06/01/14 0657 06/03/14 0500 06/04/14 0635 06/05/14 0449  WBC 18.5* 19.3* 18.9* 19.5* 11.9* 8.2  NEUTROABS  12.4* 12.9*  --  14.6* 8.4*  --   HGB 8.6* 7.4* 7.9* 7.5* 7.1* 7.0*  HCT 26.0* 23.1* 24.3* 22.2* 21.9* 20.8*  MCV 94.5 92.4 93.1 91.4 95.2 91.6  PLT 257 244 276 250 198 170   Cardiac Enzymes: No results found for this basename: CKTOTAL, CKMB, CKMBINDEX, TROPONINI,  in the last 168 hours BNP (last 3 results)  Recent Labs  10/28/13 1547  PROBNP 488.2*   CBG:  Recent Labs Lab 06/04/14 2116 06/05/14 0010 06/05/14 0655 06/05/14 0806 06/05/14 1151  GLUCAP 164* 143* 159* 155* 134*    Recent Results (from the past 240 hour(s))  CULTURE, BLOOD (ROUTINE X 2)     Status: None   Collection Time    05/30/14 12:00 PM      Result Value Ref Range Status   Specimen Description BLOOD RIGHT ARM   Final   Special Requests BOTTLES DRAWN AEROBIC AND ANAEROBIC 10CC   Final   Culture  Setup Time     Final   Value: 05/30/2014 19:00  Performed at [facility]     Final   Value: NO GROWTH 5 DAYS     Performed at [facility]   Report Status 06/05/2014 FINAL   Final  CULTURE, BLOOD (ROUTINE X 2)     Status: None   Collection Time    05/30/14 12:10 PM      Result Value Ref Range Status   Specimen Description BLOOD RIGHT HAND   Final   Special Requests BOTTLES DRAWN AEROBIC AND ANAEROBIC 5CC   Final   Culture  Setup Time     Final   Value: 05/30/2014 19:00     Performed at [facility]   Culture     Final   Value: NO GROWTH 5 DAYS     Performed at [facility]   Report Status 06/05/2014 FINAL   Final  URINE CULTURE     Status: None   Collection Time    05/30/14  5:16 PM      Result Value Ref Range Status   Specimen Description URINE, CATHETERIZED   Final   Special Requests NONE   Final   Culture  Setup Time     Final   Value: 05/31/2014 02:09     Performed at [facility]     Final   Value: NO GROWTH     Performed at [facility]   Culture     Final   Value: NO GROWTH     Performed at [facility]   Report Status 06/01/2014 FINAL   Final  CLOSTRIDIUM DIFFICILE BY PCR     Status: None   Collection Time    06/01/14  8:27 AM      Result Value Ref Range Status   C difficile by pcr NEGATIVE  NEGATIVE Final       Studies: Dg Chest Port 1 View  06/05/2014   CLINICAL DATA:  Followup pleural effusion  EXAM: PORTABLE CHEST - 1 VIEW  COMPARISON:  Chest CT 06/01/2014, CXR 05/30/2014  FINDINGS: Decreased lung volume compared with prior studies. Increase in diffuse density on the right due to layering effusion and airspace disease on the right. This suggests increase in right pleural effusion  Smaller left effusion and left lower lobe atelectasis appear similar.  IMPRESSION: Decreased lung volume compared with prior studies  Increased density on the right likely due layering effusion and Right lung compressive atelectasis.  Smaller left effusion and left lower lobe atelectasis unchanged.   Electronically Signed   By: Franchot Gallo M.D.   On: 06/05/2014 10:52        Scheduled Meds: . sodium chloride   Intravenous Once  . amoxicillin-clavulanate  875 mg Per Tube BID  . antiseptic oral rinse  7 mL Mouth Rinse BID  . aspirin  81 mg Per Tube Daily  . atorvastatin  10 mg Per Tube Daily  . chlorhexidine  15 mL Mouth Rinse BID  . docusate  200 mg Per Tube BID  . feeding supplement (JEVITY 1.2 CAL)  1,000 mL Per Tube Q24H  . free water  200 mL Per Tube 4 times per day  . levETIRAcetam  500 mg Per Tube Q0600  . levETIRAcetam  750 mg Per Tube BID  . levothyroxine  88 mcg Per Tube QAC breakfast  . sodium chloride  250 mL Intravenous Once  . Valproic Acid  750 mg Per Tube TID   Continuous Infusions:  Principal Problem:   FUO (fever of unknown origin) Active Problems:   Seizure disorder   MR (mental retardation)   Hypothyroidism   HLD (hyperlipidemia)   Hypotension   Anemia   Diastolic heart failure, NYHA class 2   Paroxysmal a-fib   Dysphasia   UTI (lower urinary tract  infection)   Tooth decay    Time spent: 30 minutes.    Vernell Leep, MD, FACP, FHM. [facility] Pager [phone]  If 7PM-7AM, please contact night-coverage [email or web address] Password [facility] 06/05/2014, 4:52 PM    LOS: 22 days

## 2014-06-06 LAB — BASIC METABOLIC PANEL
Anion gap: 5 (ref 5–15)
BUN: 6 mg/dL (ref 6–23)
CO2: 30 meq/L (ref 19–32)
Calcium: 7.8 mg/dL — ABNORMAL LOW (ref 8.4–10.5)
Chloride: 110 mEq/L (ref 96–112)
Creatinine, Ser: 0.56 mg/dL (ref 0.50–1.10)
GFR calc Af Amer: >90 mL/min (ref >90)
GFR calc non Af Amer: >90 mL/min (ref >90)
GLUCOSE: 127 mg/dL — AB (ref 70–99)
Potassium: 3.7 mEq/L (ref 3.7–5.3)
SODIUM: 145 meq/L (ref 137–147)

## 2014-06-06 LAB — CBC
HEMATOCRIT: 30.5 % — AB (ref 36.0–46.0)
Hemoglobin: 10.5 g/dL — ABNORMAL LOW (ref 12.0–15.0)
MCH: 30 pg (ref 26.0–34.0)
MCHC: 34.4 g/dL (ref 30.0–36.0)
MCV: 87.1 fL (ref 78.0–100.0)
Platelets: 160 10*3/uL (ref 150–400)
RBC: 3.5 MIL/uL — ABNORMAL LOW (ref 3.87–5.11)
RDW: 17.5 % — ABNORMAL HIGH (ref 11.5–15.5)
WBC: 9.4 10*3/uL (ref 4.0–10.5)

## 2014-06-06 LAB — TYPE AND SCREEN
ABO/RH(D): B POS
Antibody Screen: NEGATIVE
UNIT DIVISION: 0

## 2014-06-06 LAB — GLUCOSE, CAPILLARY
GLUCOSE-CAPILLARY: 90 mg/dL (ref 70–99)
GLUCOSE-CAPILLARY: 101 mg/dL — AB (ref 70–99)
GLUCOSE-CAPILLARY: 113 mg/dL — AB (ref 70–99)
GLUCOSE-CAPILLARY: 116 mg/dL — AB (ref 70–99)
Glucose-Capillary: 102 mg/dL — ABNORMAL HIGH (ref 70–99)
Glucose-Capillary: 102 mg/dL — ABNORMAL HIGH (ref 70–99)
Glucose-Capillary: 116 mg/dL — ABNORMAL HIGH (ref 70–99)

## 2014-06-06 NOTE — Plan of Care (Signed)
Problem: Consults Goal: Pneumonia Patient Education See Patient Educatio Module for education specifics.  Outcome: Not Progressing Pt u/a to learn, no family present today  Problem: Phase I Progression Outcomes Goal: OOB as tolerated unless otherwise ordered Outcome: Not Applicable Date Met:  67/89/38 Pt is wheelchair bound as outpatient  Problem: Phase II Progression Outcomes Goal: Encourage coughing & deep breathing Outcome: Not Progressing D/t AMS

## 2014-06-06 NOTE — Progress Notes (Signed)
PROGRESS NOTE    Dawn [name] TJQ:[id number] DOB: [birth year] DOA: 05/14/2014 PCP: Dawn [name]  HPI/Brief narrative 68 y.o. 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 Culture grew E coli and she completed IV antibiotics. Around 8/16 began to develop fever, had an episode of hypotension. Work up with CXR, UA, has been unrevealing. Line removed and blood cultures have remained negative. Minimal loose stool but C diff neg. Got a PEG tube for feeding. Has a history too of recurrent infections and hydronephrosis, left kidney atrophy. History of cholecystectomy. ID consulted 8/18.   Assessment/Plan:  Sepsis / septic shock due to pan sensitive E coli UTI  -She completed a 7 day abx course for UTI  - Patient developed fever couple days back and initially clear source could not be identified. Please see discussion below under FUO.  Right facial droop  -non contrasted CT Head normal 8/4 - suspect this is her baseline facies   Cerebral palsy / Acute metabolic encephalopathy / dysphagia/Seizure disorder  - has chronic dysphagia  - treating for PEG site infection - Tolerating PEG feeds @ 72ml/hour - Continue Keppra and Depacon  - Keppra level: 50.3 & Valproate level: 75.1  Hypernatremia with dehydration  -Na had normalized only to increase 8/20, s/p a dose of IV lasix 8/19. Hold further lasix. Sodium continued to increase to 151. Improved after D5 water and resume PEG feeding with free water via tube. Continue free water via tube feeds. - now volume overloaded/3rd spacing.   Hypokalemia  -replace as needed and follow.  Symptomatic anemia  -8/1 transfused 3 units PRBC - Hgb drifting down but not obvious gross blood loss - follow trend - stable over last 72 hours. Hb 7. S/p 1 PRBC 8/22 - Hb  10.5  Thrombocytopenia  -Resolved  Chronic Diastolic heart failure/Anasarca/b/l Pleural effusions  - appears volume overloaded clinically and on CT. Not on diuretics PTA. s/p single dose of IV lasix on 8/19. Incontinent and cant monitor I/O. Edema seems less. Hold further Lasix due to Hypernatremia.  Paroxysmal atrial fibrillation  -Currrently sinus SR.   Hypocalcemia  -Corrected calcium normal   FUO/PEG site infection - as above. TSH elevated and hence not cause of this. May be secondary to PEG site infection-started on Augmentin. Temperatures have settled and leukocytosis has improved. We'll hold earlier plans for thoracentesis. Manage tube feeds as per protocol.  Foreign body in the stomach - Seen on EGD. GI hopeful for spontaneous passage. Periodic imaging to follow.  Hypothyroid - Increased Synthroid to 88 mcg daily. Followup TSH in 4-6 weeks.  Bilateral pleural effusions R > L. - Does not seem to be symptomatic currently. Worsened compared to chest x-ray 8/16- layering. ? Thoracentesis versus monitor clinically and with serial x-rays. Discuss with family regarding aggressiveness of care- unable to reach- will try again in am.  Hypotension -  Secondary to intravascular volume depletion. Improved.    Code Status: DNR Family Communication: tried to call sister Ms. Dawn [name]- no response and unable to leave voice message. Disposition Plan:  SNF when medically stable- not stable for DC.   Consultants:  IR- Dr. Kathlene [name]  GI/Dr. Collene [name]  ID- Dr. Scharlene [name].  Procedures:  8/14 gastrostomy tube placed 11fr  EGD 8/20   Antibiotics: Zosyn 7/31>> stopped 8/03  Vancomycin 7/31>> stopped 8/03  Rocephin 8/03 >> stopped 8/07  Rocephin 8/11 > stopped 8/13 Augmentin 8/20 >   Subjective: Non verbal.  As per nursing- had significant tube feed residuals this AM. No reported vomiting.  Objective: Filed Vitals:   06/05/14 1716 06/05/14 2010 06/06/14 0435 06/06/14 0910    BP: 94/52 101/59 115/71 105/59  Pulse: 89 95 77 80  Temp: 98.2 F (36.8 C) 97.4 F (36.3 C) 97.6 F (36.4 C) 98 F (36.7 C)  TempSrc: Oral Oral Oral Oral  Resp: 18 16 18 18   Height:      Weight:  65.59 kg (144 lb 9.6 oz)    SpO2: 96% 97% 100% 100%    Intake/Output Summary (Last 24 hours) at 06/06/14 1559 Last data filed at 06/06/14 1000  Gross per 24 hour  Intake   1506 ml  Output      0 ml  Net   1506 ml   Filed Weights   06/01/14 2028 06/02/14 2043 06/05/14 2010  Weight: 64.7 kg (142 lb 10.2 oz) 64.7 kg (142 lb 10.2 oz) 65.59 kg (144 lb 9.6 oz)     Exam:  General exam: Moderately built and poorly nourished female lying comfortably in bed. Mucosa moist. Respiratory system: Poor inspiratory effort. Reduced breath sounds in the bases R>L. Rest of lung fields clear to auscultation. No increased work of breathing. Cardiovascular system: S1 & S2 heard, RRR. No JVD, murmurs, gallops, clicks. 1+ bilateral leg edema. Gastrointestinal system: Abdomen is nondistended, soft and nontender. Normal bowel sounds heard. PEG tube site with some purulent drainage. Midline laparotomy scar. Central nervous system: Alert but nonverbal and does not respond to questions Extremities: Moves upper extremities spontaneously. Contractures of bilateral lower extremities.   Data Reviewed: Basic Metabolic Panel:  Recent Labs Lab 05/31/14 0520 06/03/14 0500 06/04/14 0635 06/05/14 0449 06/06/14 0545  NA 141 149* 151* 145 145  K 3.9 3.4* 3.7 3.3* 3.7  CL 109 113* 115* 109 110  CO2 22 25 27 26 30   GLUCOSE 128* 80 77 191* 127*  BUN 7 7 9 8 6   CREATININE 0.58 0.58 0.64 0.49* 0.56  CALCIUM 7.7* 8.0* 8.0* 7.8* 7.8*   Liver Function Tests:  Recent Labs Lab 05/31/14 0520  AST 13  ALT 9  ALKPHOS 200*  BILITOT 0.3  PROT 6.1  ALBUMIN 1.2*   No results found for this basename: LIPASE, AMYLASE,  in the last 168 hours No results found for this basename: AMMONIA,  in the last 168  hours CBC:  Recent Labs Lab 05/31/14 0520 06/01/14 0657 06/03/14 0500 06/04/14 0635 06/05/14 0449 06/05/14 2125 06/06/14 0545  WBC 19.3* 18.9* 19.5* 11.9* 8.2  --  9.4  NEUTROABS 12.9*  --  14.6* 8.4*  --   --   --   HGB 7.4* 7.9* 7.5* 7.1* 7.0* 10.9* 10.5*  HCT 23.1* 24.3* 22.2* 21.9* 20.8* 31.5* 30.5*  MCV 92.4 93.1 91.4 95.2 91.6  --  87.1  PLT 244 276 250 198 170  --  160   Cardiac Enzymes: No results found for this basename: CKTOTAL, CKMB, CKMBINDEX, TROPONINI,  in the last 168 hours BNP (last 3 results)  Recent Labs  10/28/13 1547  PROBNP 488.2*   CBG:  Recent Labs Lab 06/05/14 2006 06/06/14 0001 06/06/14 0427 06/06/14 0727 06/06/14 1140  GLUCAP 90 116* 101* 113* 116*    Recent Results (from the past 240 hour(s))  CULTURE, BLOOD (ROUTINE X 2)     Status: None   Collection Time    05/30/14 12:00 PM  Result Value Ref Range Status   Specimen Description BLOOD RIGHT ARM   Final   Special Requests BOTTLES DRAWN AEROBIC AND ANAEROBIC 10CC   Final   Culture  Setup Time     Final   Value: 05/30/2014 19:00     Performed at [facility]   Culture     Final   Value: NO GROWTH 5 DAYS     Performed at [facility]   Report Status 06/05/2014 FINAL   Final  CULTURE, BLOOD (ROUTINE X 2)     Status: None   Collection Time    05/30/14 12:10 PM      Result Value Ref Range Status   Specimen Description BLOOD RIGHT HAND   Final   Special Requests BOTTLES DRAWN AEROBIC AND ANAEROBIC 5CC   Final   Culture  Setup Time     Final   Value: 05/30/2014 19:00     Performed at [facility]   Culture     Final   Value: NO GROWTH 5 DAYS     Performed at [facility]   Report Status 06/05/2014 FINAL   Final  URINE CULTURE     Status: None   Collection Time    05/30/14  5:16 PM      Result Value Ref Range Status   Specimen Description URINE, CATHETERIZED   Final   Special Requests NONE   Final   Culture  Setup Time     Final   Value:  05/31/2014 02:09     Performed at [place]     Final   Value: NO GROWTH     Performed at [facility]   Culture     Final   Value: NO GROWTH     Performed at [facility]   Report Status 06/01/2014 FINAL   Final  CLOSTRIDIUM DIFFICILE BY PCR     Status: None   Collection Time    06/01/14  8:27 AM      Result Value Ref Range Status   C difficile by pcr NEGATIVE  NEGATIVE Final       Studies: Dg Chest Port 1 View  06/05/2014   CLINICAL DATA:  Followup pleural effusion  EXAM: PORTABLE CHEST - 1 VIEW  COMPARISON:  Chest CT 06/01/2014, CXR 05/30/2014  FINDINGS: Decreased lung volume compared with prior studies. Increase in diffuse density on the right due to layering effusion and airspace disease on the right. This suggests increase in right pleural effusion  Smaller left effusion and left lower lobe atelectasis appear similar.  IMPRESSION: Decreased lung volume compared with prior studies  Increased density on the right likely due layering effusion and Right lung compressive atelectasis.  Smaller left effusion and left lower lobe atelectasis unchanged.   Electronically Signed   By: Franchot Gallo M.D.   On: 06/05/2014 10:52        Scheduled Meds: . sodium chloride   Intravenous Once  . amoxicillin-clavulanate  875 mg Per Tube BID  . antiseptic oral rinse  7 mL Mouth Rinse BID  . aspirin  81 mg Per Tube Daily  . atorvastatin  10 mg Per Tube Daily  . chlorhexidine  15 mL Mouth Rinse BID  . docusate  200 mg Per Tube BID  . feeding supplement (JEVITY 1.2 CAL)  1,000 mL Per Tube Q24H  . free water  200 mL Per Tube 4 times per day  . levETIRAcetam  500 mg Per Tube Q0600  . levETIRAcetam  750 mg Per Tube BID  . levothyroxine  88 mcg Per Tube QAC breakfast  . sodium chloride  250 mL Intravenous Once  . Valproic Acid  750 mg Per Tube TID   Continuous Infusions:    Principal Problem:   FUO (fever of unknown origin) Active Problems:    Seizure disorder   MR (mental retardation)   Hypothyroidism   HLD (hyperlipidemia)   Hypotension   Anemia   Diastolic heart failure, NYHA class 2   Paroxysmal a-fib   Dysphasia   UTI (lower urinary tract infection)   Tooth decay    Time spent: 30 minutes.    Vernell Leep, MD, FACP, FHM. [facility] Pager [phone]  If 7PM-7AM, please contact night-coverage [email or web address] Password [email or web address] 06/06/2014, 3:59 PM    LOS: 23 days

## 2014-06-06 NOTE — Progress Notes (Signed)
Gastric residual 210. Will continue to monitor. Spoke with Dietary, states notify if GR >400.

## 2014-06-06 NOTE — Progress Notes (Signed)
Drainage noted to g-tube dressing this am, dressing changed. Gastric residual 170 mls this am. Will continue to monitor.

## 2014-06-07 ENCOUNTER — Inpatient Hospital Stay (HOSPITAL_COMMUNITY): Payer: Medicare Other

## 2014-06-07 LAB — GLUCOSE, CAPILLARY
GLUCOSE-CAPILLARY: 71 mg/dL (ref 70–99)
Glucose-Capillary: 106 mg/dL — ABNORMAL HIGH (ref 70–99)

## 2014-06-07 MED ORDER — FUROSEMIDE 20 MG PO TABS
20.0000 mg | ORAL_TABLET | Freq: Once | ORAL | Status: AC
  Administered 2014-06-07: 20 mg
  Filled 2014-06-07: qty 1

## 2014-06-07 NOTE — Progress Notes (Signed)
NUTRITION FOLLOW UP  INTERVENTION: -Continue current TF regimen: Jevity 1.2 formula at 55 ml/hr via PEG, which provides 1584 kcals, 73 gm protein, and 1065 ml of free water.   NUTRITION DIAGNOSIS: Inadequate oral intake related to inability to eat as evidenced by NPO status, ongoing  Goal: Pt to meet >/= 90% of their estimated nutrition needs, met  Monitor:  TF regimen & tolerance, weight, labs, I/O's  ASSESSMENT: 68 y.o. Female with PMH of seizures, osteoporosis, thyroid disease, and CAD; admitted with sepsis due to UTI and possible aspiration, severe dehydration due to poor by mouth intake likely secondary to severe tooth decay resulted in hypernatremia.   8/11-Jevity 1.2 formula currently infusing at 55 ml/hr via Panda tube (tip in fundus of stomach) providing 1584 kcals, 73 gm protein, 1065 ml of free water.  Noted patient pulled out feeding tube 5:54 AM; replaced at 08:00. -Speech Path notes reviewed.  Prognosis for PO diet initiation appears fair-poor. -IR request for PEG tube placement.  Patient with temp of 100.9.  8/18- PEG tube placed 8/14. Sepsis has resolved, but pt temperature has been spiking to 101. Per SLP note, poor prognosis for swallow function, no gains made. Per RN, pt has been tolerating the tube feeding well.   8/24- Pt with infection at PEG site. Pt body temperature have been normal. Per RN, Pt has been tolerating the tube feeding, pt with gastric residuals around 250 ml.   Jevity 1.2 formula currently infusing at 55 ml/hr via PEG tube providing 1584 kcals, 73 gm protein, 1065 ml of free water.   Labs: Low calcium. High glucose (127 mg/dL).  Height: Ht Readings from Last 1 Encounters:  05/14/14 5' 1.81" (1.57 m)    Weight: Wt Readings from Last 1 Encounters:  06/06/14 144 lb 9.6 oz (65.59 kg)  Admit wt 125 lb   Net: +18 L since admission (7/31)  BMI:  Body mass index is 26.61 kg/(m^2).  Re-Estimated Nutritional Needs: Kcal: 1500-1700 Protein:  70-85 gm Fluid: 1.5-1.7 L  Skin: Stage II pressure ulcer on buttocks, +2 generalized edema  Diet Order: NPO  Last BM: 8/24   Intake/Output Summary (Last 24 hours) at 06/07/14 1334 Last data filed at 06/07/14 0900  Gross per 24 hour  Intake    560 ml  Output      0 ml  Net    560 ml   Labs:   Recent Labs Lab 06/04/14 0635 06/05/14 0449 06/06/14 0545  NA 151* 145 145  K 3.7 3.3* 3.7  CL 115* 109 110  CO2 _0 BUN _1 CREATININE 0.64 0.49* 0.56  CALCIUM 8.0* 7.8* 7.8*  GLUCOSE 77 191* 127*    Scheduled Meds: . sodium chloride   Intravenous Once  . amoxicillin-clavulanate  875 mg Per Tube BID  . antiseptic oral rinse  7 mL Mouth Rinse BID  . aspirin  81 mg Per Tube Daily  . atorvastatin  10 mg Per Tube Daily  . chlorhexidine  15 mL Mouth Rinse BID  . docusate  200 mg Per Tube BID  . feeding supplement (JEVITY 1.2 CAL)  1,000 mL Per Tube Q24H  . free water  200 mL Per Tube 4 times per day  . levETIRAcetam  500 mg Per Tube Q0600  . levETIRAcetam  750 mg Per Tube BID  . levothyroxine  88 mcg Per Tube QAC breakfast  . sodium chloride  250 mL Intravenous Once  . Valproic Acid  750 mg  Per Tube TID    Continuous Infusions:    Past Medical History  Diagnosis Date  . Seizures   . Osteoporosis   . Thyroid disease     hypothyroid  . High cholesterol   . Cataracts, bilateral   . Optic atrophy   . Paraparesis     mild  . Hypothyroidism   . Anginal pain   . Coronary artery disease     Past Surgical History  Procedure Laterality Date  . Midline incision    . Central venous catheter insertion  05/15/2014       . Esophagogastroduodenoscopy N/A 06/03/2014    Procedure: ESOPHAGOGASTRODUODENOSCOPY (EGD);  Surgeon: Beryle Beams, MD;  Location: [facility] ENDOSCOPY;  Service: Endoscopy;  Laterality: N/A;    Kallie Locks, MS, Provisional LDN Pager # [phone] After hours/ weekend pager # [phone]

## 2014-06-07 NOTE — Progress Notes (Signed)
PROGRESS NOTE    Dawn [name] IFO:[id number] DOB: 06-10-1946 DOA: 05/14/2014 PCP: [name]  HPI/Brief narrative 68 y.o. 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 Culture grew E coli and she completed IV antibiotics. Around 8/16 began to develop fever, had an episode of hypotension. Work up with CXR, UA, has been unrevealing. Line removed and blood cultures have remained negative. Minimal loose stool but C diff neg. Got a PEG tube for feeding. Has a history too of recurrent infections and hydronephrosis, left kidney atrophy. History of cholecystectomy. ID consulted 8/18.   Assessment/Plan:  Sepsis / septic shock due to pan sensitive E coli UTI  -She completed a 7 day abx course for UTI  - Patient developed fever couple days back and initially clear source could not be identified. Please see discussion below under FUO.  Right facial droop  -non contrasted CT Head normal 8/4 - suspect this is her baseline facies   Cerebral palsy / Acute metabolic encephalopathy / dysphagia/Seizure disorder  - Patient apparently had meningitis as a baby and since then has severe mental retardation  - has chronic dysphagia  - treating for PEG site infection - Tolerating PEG feeds @ 86ml/hour - Continue Keppra and Depacon  - Keppra level: 50.3 & Valproate level: 75.1  Hypernatremia with dehydration  - Resolved after IV hypotonic fluids. Discontinued IVF. Now anasarca.  Hypokalemia  -replace as needed and follow.  Symptomatic anemia  -8/1 transfused 3 units PRBC - Hgb drifting down but not obvious gross blood loss - follow trend - stable over last 72 hours. Hb 7. S/p 1 PRBC 8/22 - Hb 10.5  Thrombocytopenia  -Resolved  Chronic Diastolic heart failure/Anasarca/b/l Pleural effusions  - appears  volume overloaded clinically and on CT. Not on diuretics PTA. s/p single dose of IV lasix on 8/19. Incontinent and cant monitor I/O. Significant anasarca-lower extremity edema, sacral and back edema, edema of upper extremities. Trial of Lasix 20 mg via PEG x1 dose. Unable to monitor urine output secondary to incontinence.  Paroxysmal atrial fibrillation  -Currrently sinus SR.   Hypocalcemia  -Corrected calcium normal   FUO/PEG site infection - as above. TSH elevated and hence not cause of this. May be secondary to PEG site infection-started on Augmentin. Temperatures have settled and leukocytosis has improved. We'll hold earlier plans for thoracentesis. Manage tube feeds as per protocol.  Foreign body in the stomach - Seen on EGD. GI hopeful for spontaneous passage. Discussed with radiologist on 8/24-foreign body has been present on CT scans as far back as 2012. Less likely to pass. Not causing discomfort or complications. Unable to remove by EGD. Not surgical candidate.  Hypothyroid - Increased Synthroid to 88 mcg daily. Followup TSH in 4-6 weeks.  Bilateral pleural effusions R > L. - Does not seem to be symptomatic currently. Worsened compared to chest x-ray 8/16- layering. Repeat chest x-ray 8/24 shows improvement. Trial of Lasix. Discussed with sister-agrees with holding off on thoracentesis and treating supportively. She is agreeable for palliative care consultation for goals of care.  Hypotension -  Secondary to intravascular volume depletion. Improved.    Code Status: DNR Family Communication: Discussed with Ms. [name] 8/24. Agreeable to palliative care consultation for goals of care. Disposition Plan:  SNF when medically stable- not stable for DC.   Consultants:  IR- Dr. Kathlene Cote  GI/Dr. Collene Mares  ID- Dr. Scharlene Gloss.  Palliative care team  Procedures:  8/14 gastrostomy tube placed 59fr  EGD 8/20   Antibiotics: Zosyn 7/31>> stopped 8/03  Vancomycin 7/31>>  stopped 8/03  Rocephin 8/03 >> stopped 8/07  Rocephin 8/11 > stopped 8/13 Augmentin 8/20 >   Subjective: Non verbal.    Objective: Filed Vitals:   06/06/14 1718 06/06/14 2007 06/07/14 0427 06/07/14 1000  BP: 106/68 114/75 112/70 118/61  Pulse: 82 94 93 96  Temp: 98.2 F (36.8 C) 98.5 F (36.9 C) 98.6 F (37 C) 98.7 F (37.1 C)  TempSrc: Oral Oral Oral Axillary  Resp: 18 18 18    Height:      Weight:  65.59 kg (144 lb 9.6 oz)    SpO2: 100% 95% 93% 98%    Intake/Output Summary (Last 24 hours) at 06/07/14 1430 Last data filed at 06/07/14 0900  Gross per 24 hour  Intake    560 ml  Output      0 ml  Net    560 ml   Filed Weights   06/02/14 2043 06/05/14 2010 06/06/14 2007  Weight: 64.7 kg (142 lb 10.2 oz) 65.59 kg (144 lb 9.6 oz) 65.59 kg (144 lb 9.6 oz)     Exam:  General exam: Moderately built and poorly nourished female lying comfortably in bed. Mucosa moist. Respiratory system: Poor inspiratory effort. Reduced breath sounds in the bases R>L. Rest of lung fields clear to auscultation. No increased work of breathing. Cardiovascular system: S1 & S2 heard, RRR. No JVD, murmurs, gallops, clicks. 1-2+ pitting edema of bilateral legs extending to sacrum, lower back. Edema of upper extremities. Gastrointestinal system: Abdomen is nondistended, soft and nontender. Normal bowel sounds heard. PEG tube site with some purulent drainage. Midline laparotomy scar. Central nervous system: Alert but nonverbal and does not respond to questions Extremities: Moves upper extremities spontaneously. Contractures of bilateral lower extremities.   Data Reviewed: Basic Metabolic Panel:  Recent Labs Lab 06/03/14 0500 06/04/14 0635 06/05/14 0449 06/06/14 0545  NA 149* 151* 145 145  K 3.4* 3.7 3.3* 3.7  CL 113* 115* 109 110  CO2 25 27 26 30   GLUCOSE 80 77 191* 127*  BUN 7 9 8 6   CREATININE 0.58 0.64 0.49* 0.56  CALCIUM 8.0* 8.0* 7.8* 7.8*   Liver Function Tests: No results found  for this basename: AST, ALT, ALKPHOS, BILITOT, PROT, ALBUMIN,  in the last 168 hours No results found for this basename: LIPASE, AMYLASE,  in the last 168 hours No results found for this basename: AMMONIA,  in the last 168 hours CBC:  Recent Labs Lab 06/01/14 0657 06/03/14 0500 06/04/14 0635 06/05/14 0449 06/05/14 2125 06/06/14 0545  WBC 18.9* 19.5* 11.9* 8.2  --  9.4  NEUTROABS  --  14.6* 8.4*  --   --   --   HGB 7.9* 7.5* 7.1* 7.0* 10.9* 10.5*  HCT 24.3* 22.2* 21.9* 20.8* 31.5* 30.5*  MCV 93.1 91.4 95.2 91.6  --  87.1  PLT 276 250 198 170  --  160   Cardiac Enzymes: No results found for this basename: CKTOTAL, CKMB, CKMBINDEX, TROPONINI,  in the last 168 hours BNP (last 3 results)  Recent Labs  10/28/13 1547  PROBNP 488.2*   CBG:  Recent Labs Lab 06/06/14 1140 06/06/14 1623 06/06/14 2002 06/07/14 0003 06/07/14 0406  GLUCAP 116* 102* 102* 106* 71    Recent Results (from the past 240 hour(s))  CULTURE, BLOOD (ROUTINE X 2)     Status:  None   Collection Time    05/30/14 12:00 PM      Result Value Ref Range Status   Specimen Description BLOOD RIGHT ARM   Final   Special Requests BOTTLES DRAWN AEROBIC AND ANAEROBIC 10CC   Final   Culture  Setup Time     Final   Value: 05/30/2014 19:00     Performed at [facility]   Culture     Final   Value: NO GROWTH 5 DAYS     Performed at [facility]   Report Status 06/05/2014 FINAL   Final  CULTURE, BLOOD (ROUTINE X 2)     Status: None   Collection Time    05/30/14 12:10 PM      Result Value Ref Range Status   Specimen Description BLOOD RIGHT HAND   Final   Special Requests BOTTLES DRAWN AEROBIC AND ANAEROBIC 5CC   Final   Culture  Setup Time     Final   Value: 05/30/2014 19:00     Performed at [facility]   Culture     Final   Value: NO GROWTH 5 DAYS     Performed at [facility]   Report Status 06/05/2014 FINAL   Final  URINE CULTURE     Status: None   Collection Time     05/30/14  5:16 PM      Result Value Ref Range Status   Specimen Description URINE, CATHETERIZED   Final   Special Requests NONE   Final   Culture  Setup Time     Final   Value: 05/31/2014 02:09     Performed at [place]     Final   Value: NO GROWTH     Performed at [facility]   Culture     Final   Value: NO GROWTH     Performed at [facility]   Report Status 06/01/2014 FINAL   Final  CLOSTRIDIUM DIFFICILE BY PCR     Status: None   Collection Time    06/01/14  8:27 AM      Result Value Ref Range Status   C difficile by pcr NEGATIVE  NEGATIVE Final       Studies: Dg Chest Port 1 View  06/07/2014   CLINICAL DATA:  Followup of pleural effusion.  EXAM: PORTABLE CHEST - 1 VIEW  COMPARISON:  06/05/2014  FINDINGS: Normal heart size. Small right pleural effusion, likely similar. Cannot exclude trace left pleural fluid. Low lung volumes. No pneumothorax. Mild interstitial edema. Improved right-sided aeration with lower lobe predominant airspace disease remaining.  IMPRESSION: Improved right-sided aeration with decreased atelectasis or infection.  Decrease in small right pleural effusion.  Mild interstitial edema, similar.   Electronically Signed   By: Abigail Miyamoto M.D.   On: 06/07/2014 07:53        Scheduled Meds: . sodium chloride   Intravenous Once  . amoxicillin-clavulanate  875 mg Per Tube BID  . antiseptic oral rinse  7 mL Mouth Rinse BID  . aspirin  81 mg Per Tube Daily  . atorvastatin  10 mg Per Tube Daily  . chlorhexidine  15 mL Mouth Rinse BID  . docusate  200 mg Per Tube BID  . feeding supplement (JEVITY 1.2 CAL)  1,000 mL Per Tube Q24H  . free water  200 mL Per Tube 4 times per day  . levETIRAcetam  500 mg Per Tube Q0600  .  levETIRAcetam  750 mg Per Tube BID  . levothyroxine  88 mcg Per Tube QAC breakfast  . sodium chloride  250 mL Intravenous Once  . Valproic Acid  750 mg Per Tube TID   Continuous Infusions:     Principal Problem:   FUO (fever of unknown origin) Active Problems:   Seizure disorder   MR (mental retardation)   Hypothyroidism   HLD (hyperlipidemia)   Hypotension   Anemia   Diastolic heart failure, NYHA class 2   Paroxysmal a-fib   Dysphasia   UTI (lower urinary tract infection)   Tooth decay    Time spent: 30 minutes.    Vernell Leep, MD, FACP, FHM. [facility] Pager [phone]  If 7PM-7AM, please contact night-coverage [email or web address] Password [email or web address] 06/07/2014, 2:30 PM    LOS: 24 days

## 2014-06-07 NOTE — Progress Notes (Signed)
Note and chart reviewed. If weight continues to trend up, may need to decrease TF rate. Note, current TF regimen plus free water flushes provides 1865 ml of water daily. RD monitoring appropriately.   Pryor Ochoa RD, LDN Inpatient Clinical Dietitian Pager: [phone] After Hours Pager: [phone]

## 2014-06-07 NOTE — Progress Notes (Signed)
Patient Dawn [name]      DOB: 12/14/1945      KDT:[id number]  Consult received for PMT GOC.  No family at bedside.  Patient comfortable.  Called sisters number [phone] , no answer and rings until a voice says "please enter your remote access code".  Will continue to attempt to reach patient's sister to arrange [place] meeting.  Kasten Leveque L. Lovena Le, MD MBA The Palliative Medicine Team at [facility] Phone: [phone] Pager: [phone] ( Use team phone after hours)

## 2014-06-08 LAB — CBC
HCT: 31.8 % — ABNORMAL LOW (ref 36.0–46.0)
Hemoglobin: 10.3 g/dL — ABNORMAL LOW (ref 12.0–15.0)
MCH: 29.7 pg (ref 26.0–34.0)
MCHC: 32.4 g/dL (ref 30.0–36.0)
MCV: 91.6 fL (ref 78.0–100.0)
Platelets: 164 10*3/uL (ref 150–400)
RBC: 3.47 MIL/uL — ABNORMAL LOW (ref 3.87–5.11)
RDW: 17.1 % — AB (ref 11.5–15.5)
WBC: 9.7 10*3/uL (ref 4.0–10.5)

## 2014-06-08 LAB — GLUCOSE, CAPILLARY: GLUCOSE-CAPILLARY: 99 mg/dL (ref 70–99)

## 2014-06-08 LAB — BASIC METABOLIC PANEL
ANION GAP: 9 (ref 5–15)
BUN: 6 mg/dL (ref 6–23)
CALCIUM: 7.9 mg/dL — AB (ref 8.4–10.5)
CO2: 29 mEq/L (ref 19–32)
Chloride: 106 mEq/L (ref 96–112)
Creatinine, Ser: 0.46 mg/dL — ABNORMAL LOW (ref 0.50–1.10)
Glucose, Bld: 105 mg/dL — ABNORMAL HIGH (ref 70–99)
Potassium: 3.7 mEq/L (ref 3.7–5.3)
Sodium: 144 mEq/L (ref 137–147)

## 2014-06-08 LAB — PRO B NATRIURETIC PEPTIDE: Pro B Natriuretic peptide (BNP): 899.3 pg/mL — ABNORMAL HIGH (ref 0–125)

## 2014-06-08 MED ORDER — FUROSEMIDE 20 MG PO TABS
20.0000 mg | ORAL_TABLET | Freq: Every day | ORAL | Status: AC
  Administered 2014-06-08 – 2014-06-09 (×2): 20 mg
  Filled 2014-06-08 (×2): qty 1

## 2014-06-08 NOTE — Progress Notes (Signed)
Patient AL:[id number] J Klopf      DOB: [birth year]      OXB:[id number]  No call from family overnight will attempt again this am.  Allisson Schindel L. Lovena Le, MD MBA The Palliative Medicine Team at [facility] Phone: [phone] Pager: [phone] ( Use team phone after hours)

## 2014-06-08 NOTE — Progress Notes (Signed)
PROGRESS NOTE    Dawn [name]:[id number] DOB: [birth year] DOA: 05/14/2014 PCP: [name]  HPI/Brief narrative 68 y.o. 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 Culture grew E coli and she completed IV antibiotics. She again developed fever-eventually attributed to PEG site infection-fevers resolved after Augmentin initiation. Status post EGD-old foreign body in the stomach and peg tube fixed. Treating for anasarca. Discussed with sister/guardian and palliative care consulted for goals of care-meeting on 8/26  Assessment/Plan:  Sepsis / septic shock due to pan sensitive E coli UTI  -She completed a 7 day abx course for UTI  - Patient developed fever couple days back and initially clear source could not be identified. Please see discussion below under FUO.  Right facial droop  -non contrasted CT Head normal 8/4 - suspect this is her baseline facies   Cerebral palsy / Acute metabolic encephalopathy / dysphagia/Seizure disorder  - Patient apparently had meningitis as a baby and since then has severe mental retardation  - has chronic dysphagia  - treating for PEG site infection - Tolerating PEG feeds @ 75ml/hour - Continue Keppra and Depacon  - Keppra level: 50.3 & Valproate level: 75.1  Hypernatremia with dehydration  - Resolved after IV hypotonic fluids. Discontinued IVF. Now anasarca.  Hypokalemia  -replace as needed and follow.  Symptomatic anemia  -8/1 transfused 3 units PRBC - Hgb drifting down but not obvious gross blood loss - follow trend - stable over last 72 hours. Hb 7. S/p 1 PRBC 8/22 - Hb stable.  Thrombocytopenia  -Resolved  Chronic Diastolic heart failure/Anasarca/b/l Pleural effusions  - appears volume overloaded clinically and on CT. Not on diuretics  PTA. s/p single dose of IV lasix on 8/19. Incontinent and cant monitor I/O. Significant anasarca-lower extremity edema, sacral and back edema, edema of upper extremities. Trial of Lasix 20 mg via PEG x1 dose. Unable to monitor urine output secondary to incontinence voiding frequently per nursing.   Paroxysmal atrial fibrillation  -Currrently sinus SR.   Hypocalcemia  -Corrected calcium normal   FUO/PEG site infection - as above. TSH elevated and hence not cause of this. May be secondary to PEG site infection-started on Augmentin. Temperatures have settled and leukocytosis has improved. We'll hold earlier plans for thoracentesis. Manage tube feeds as per protocol.  Foreign body in the stomach - Seen on EGD. GI hopeful for spontaneous passage. Discussed with radiologist on 8/24-foreign body has been present on CT scans as far back as 2012. Less likely to pass. Not causing discomfort or complications. Unable to remove by EGD. Not surgical candidate.  Hypothyroid - Increased Synthroid to 88 mcg daily. Followup TSH in 4-6 weeks.  Bilateral pleural effusions R > L. - Does not seem to be symptomatic currently. Worsened compared to chest x-ray 8/16- layering. Repeat chest x-ray 8/24 shows improvement. Trial of Lasix. Discussed with sister-agrees with holding off on thoracentesis and treating supportively. She is agreeable for palliative care consultation for goals of care.  Hypotension -  Secondary to intravascular volume depletion. Resolved. Some of readings are not accurate.   Code Status: DNR Family Communication: Discussed with Ms. [name] 8/24. Agreeable to palliative care consultation for goals of care. Disposition Plan:  SNF when medically stable- not stable for DC.   Consultants:  IR- Dr. Kathlene Cote  GI/Dr. Collene Mares  ID- Dr. Scharlene Gloss.  Palliative care  team  Procedures:  8/14 gastrostomy tube placed 37fr  EGD 8/20   Antibiotics: Zosyn 7/31>> stopped 8/03  Vancomycin  7/31>> stopped 8/03  Rocephin 8/03 >> stopped 8/07  Rocephin 8/11 > stopped 8/13 Augmentin 8/20 >   Subjective: Non verbal.  As per nursing PEG tube residuals of 150 mL this morning. Increased frequency of voiding.  Objective: Filed Vitals:   06/07/14 2029 06/08/14 0515 06/08/14 0929 06/08/14 0938  BP: 121/60 119/62 95/59 101/64  Pulse: 90 73 97 98  Temp: 98.8 F (37.1 C) 98.2 F (36.8 C) 97.9 F (36.6 C) 98.3 F (36.8 C)  TempSrc: Oral Oral Axillary Oral  Resp: 18 18 18 18   Height:      Weight: 65.59 kg (144 lb 9.6 oz)     SpO2: 99% 93% 91% 99%    Intake/Output Summary (Last 24 hours) at 06/08/14 1722 Last data filed at 06/08/14 1100  Gross per 24 hour  Intake    400 ml  Output      0 ml  Net    400 ml   Filed Weights   06/05/14 2010 06/06/14 2007 06/07/14 2029  Weight: 65.59 kg (144 lb 9.6 oz) 65.59 kg (144 lb 9.6 oz) 65.59 kg (144 lb 9.6 oz)     Exam:  General exam: Moderately built and poorly nourished female lying comfortably in bed. Mucosa moist. Respiratory system: Poor inspiratory effort. Reduced breath sounds in the bases R>L. Rest of lung fields clear to auscultation. No increased work of breathing. Cardiovascular system: S1 & S2 heard, RRR. No JVD, murmurs, gallops, clicks. 1-2+ pitting edema of bilateral legs extending to sacrum, lower back. Edema of upper extremities. Edema seems to be slightly less. Gastrointestinal system: Abdomen is nondistended, soft and nontender. Normal bowel sounds heard. PEG tube site with less purulence around. Midline laparotomy scar. Central nervous system: Alert but nonverbal and does not respond to questions Extremities: Moves upper extremities spontaneously. Contractures of bilateral lower extremities.   Data Reviewed: Basic Metabolic Panel:  Recent Labs Lab 06/03/14 0500 06/04/14 0635 06/05/14 0449 06/06/14 0545 06/08/14 0715  NA 149* 151* 145 145 144  K 3.4* 3.7 3.3* 3.7 3.7  CL 113* 115* 109 110 106  CO2 25  27 26 30 29   GLUCOSE 80 77 191* 127* 105*  BUN 7 9 8 6 6   CREATININE 0.58 0.64 0.49* 0.56 0.46*  CALCIUM 8.0* 8.0* 7.8* 7.8* 7.9*   Liver Function Tests: No results found for this basename: AST, ALT, ALKPHOS, BILITOT, PROT, ALBUMIN,  in the last 168 hours No results found for this basename: LIPASE, AMYLASE,  in the last 168 hours No results found for this basename: AMMONIA,  in the last 168 hours CBC:  Recent Labs Lab 06/03/14 0500 06/04/14 0635 06/05/14 0449 06/05/14 2125 06/06/14 0545 06/08/14 0715  WBC 19.5* 11.9* 8.2  --  9.4 9.7  NEUTROABS 14.6* 8.4*  --   --   --   --   HGB 7.5* 7.1* 7.0* 10.9* 10.5* 10.3*  HCT 22.2* 21.9* 20.8* 31.5* 30.5* 31.8*  MCV 91.4 95.2 91.6  --  87.1 91.6  PLT 250 198 170  --  160 164   Cardiac Enzymes: No results found for this basename: CKTOTAL, CKMB, CKMBINDEX, TROPONINI,  in the last 168 hours BNP (last 3 results)  Recent Labs  10/28/13 1547 06/08/14 0715  PROBNP 488.2* 899.3*   CBG:  Recent Labs Lab 06/06/14 1623 06/06/14 2002 06/07/14 0003 06/07/14 0406 06/08/14 1129  GLUCAP  102* 102* 106* 71 99    Recent Results (from the past 240 hour(s))  CULTURE, BLOOD (ROUTINE X 2)     Status: None   Collection Time    05/30/14 12:00 PM      Result Value Ref Range Status   Specimen Description BLOOD RIGHT ARM   Final   Special Requests BOTTLES DRAWN AEROBIC AND ANAEROBIC 10CC   Final   Culture  Setup Time     Final   Value: 05/30/2014 19:00     Performed at [facility]   Culture     Final   Value: NO GROWTH 5 DAYS     Performed at [facility]   Report Status 06/05/2014 FINAL   Final  CULTURE, BLOOD (ROUTINE X 2)     Status: None   Collection Time    05/30/14 12:10 PM      Result Value Ref Range Status   Specimen Description BLOOD RIGHT HAND   Final   Special Requests BOTTLES DRAWN AEROBIC AND ANAEROBIC 5CC   Final   Culture  Setup Time     Final   Value: 05/30/2014 19:00     Performed at [facility]   Culture     Final   Value: NO GROWTH 5 DAYS     Performed at [facility]   Report Status 06/05/2014 FINAL   Final  URINE CULTURE     Status: None   Collection Time    05/30/14  5:16 PM      Result Value Ref Range Status   Specimen Description URINE, CATHETERIZED   Final   Special Requests NONE   Final   Culture  Setup Time     Final   Value: 05/31/2014 02:09     Performed at [facility]     Final   Value: NO GROWTH     Performed at [facility]   Culture     Final   Value: NO GROWTH     Performed at [facility]   Report Status 06/01/2014 FINAL   Final  CLOSTRIDIUM DIFFICILE BY PCR     Status: None   Collection Time    06/01/14  8:27 AM      Result Value Ref Range Status   C difficile by pcr NEGATIVE  NEGATIVE Final       Studies: Dg Chest Port 1 View  06/07/2014   CLINICAL DATA:  Followup of pleural effusion.  EXAM: PORTABLE CHEST - 1 VIEW  COMPARISON:  06/05/2014  FINDINGS: Normal heart size. Small right pleural effusion, likely similar. Cannot exclude trace left pleural fluid. Low lung volumes. No pneumothorax. Mild interstitial edema. Improved right-sided aeration with lower lobe predominant airspace disease remaining.  IMPRESSION: Improved right-sided aeration with decreased atelectasis or infection.  Decrease in small right pleural effusion.  Mild interstitial edema, similar.   Electronically Signed   By: Abigail Miyamoto M.D.   On: 06/07/2014 07:53        Scheduled Meds: . amoxicillin-clavulanate  875 mg Per Tube BID  . antiseptic oral rinse  7 mL Mouth Rinse BID  . aspirin  81 mg Per Tube Daily  . atorvastatin  10 mg Per Tube Daily  . chlorhexidine  15 mL Mouth Rinse BID  . docusate  200 mg Per Tube BID  . feeding supplement (JEVITY 1.2 CAL)  1,000 mL Per Tube Q24H  . free water  200  mL Per Tube 4 times per day  . levETIRAcetam  500 mg Per Tube Q0600  . levETIRAcetam  750 mg Per Tube BID  . levothyroxine   88 mcg Per Tube QAC breakfast  . Valproic Acid  750 mg Per Tube TID   Continuous Infusions:    Principal Problem:   FUO (fever of unknown origin) Active Problems:   Seizure disorder   MR (mental retardation)   Hypothyroidism   HLD (hyperlipidemia)   Hypotension   Anemia   Diastolic heart failure, NYHA class 2   Paroxysmal a-fib   Dysphasia   UTI (lower urinary tract infection)   Tooth decay    Time spent: 30 minutes.    Vernell Leep, MD, FACP, FHM. [facility] Pager [phone]  If 7PM-7AM, please contact night-coverage [email or web address] Password [email or web address] 06/08/2014, 5:22 PM    LOS: 25 days

## 2014-06-08 NOTE — Progress Notes (Signed)
Patient OR:[id number] J Coats      DOB: 1946-09-10      KFE:[id number]  Was able to reach [name], patient's sister.  She is unable to come to the hospital today, but is willing to meet with me at 11 am on Wed 8/26.   Zunairah Devers L. Lovena Le, MD MBA The Palliative Medicine Team at [facility] Phone: [phone] Pager: [phone] ( Use team phone after hours)

## 2014-06-09 ENCOUNTER — Inpatient Hospital Stay (HOSPITAL_COMMUNITY): Payer: Medicare Other

## 2014-06-09 DIAGNOSIS — Z515 Encounter for palliative care: Secondary | ICD-10-CM

## 2014-06-09 LAB — BASIC METABOLIC PANEL
Anion gap: 8 (ref 5–15)
BUN: 6 mg/dL (ref 6–23)
CALCIUM: 8 mg/dL — AB (ref 8.4–10.5)
CO2: 29 meq/L (ref 19–32)
CREATININE: 0.51 mg/dL (ref 0.50–1.10)
Chloride: 110 mEq/L (ref 96–112)
GFR calc Af Amer: >90 mL/min (ref >90)
GFR calc non Af Amer: >90 mL/min (ref >90)
Glucose, Bld: 123 mg/dL — ABNORMAL HIGH (ref 70–99)
Potassium: 4 mEq/L (ref 3.7–5.3)
Sodium: 147 mEq/L (ref 137–147)

## 2014-06-09 LAB — GLUCOSE, CAPILLARY
Glucose-Capillary: 127 mg/dL — ABNORMAL HIGH (ref 70–99)
Glucose-Capillary: 121 mg/dL — ABNORMAL HIGH (ref 70–99)
Glucose-Capillary: 95 mg/dL (ref 70–99)

## 2014-06-09 MED ORDER — ASPIRIN EC 81 MG PO TBEC: 81.0000 mg | DELAYED_RELEASE_TABLET | Freq: Every day | ORAL | Status: DC

## 2014-06-09 MED ORDER — ALBUTEROL SULFATE (2.5 MG/3ML) 0.083% IN NEBU: 2.5000 mg | INHALATION_SOLUTION | Freq: Four times a day (QID) | RESPIRATORY_TRACT | Status: DC | PRN

## 2014-06-09 MED ORDER — VALPROIC ACID 250 MG/5ML PO SYRP: 750.0000 mg | ORAL_SOLUTION | Freq: Three times a day (TID) | ORAL | Status: AC

## 2014-06-09 MED ORDER — FUROSEMIDE 20 MG PO TABS: 20.0000 mg | ORAL_TABLET | Freq: Every day | ORAL | Status: DC

## 2014-06-09 MED ORDER — POTASSIUM CHLORIDE ER 10 MEQ PO TBCR: 20.0000 meq | EXTENDED_RELEASE_TABLET | Freq: Every day | ORAL | Status: DC

## 2014-06-09 MED ORDER — LEVOTHYROXINE SODIUM 88 MCG PO TABS: 88.0000 ug | ORAL_TABLET | Freq: Every day | ORAL | Status: DC

## 2014-06-09 MED ORDER — DOCUSATE SODIUM 50 MG/5ML PO LIQD: 200.0000 mg | Freq: Two times a day (BID) | ORAL | Status: AC

## 2014-06-09 MED ORDER — FREE WATER
200.0000 mL | Freq: Four times a day (QID) | Status: DC
Start: 2014-06-09 — End: 2014-11-15

## 2014-06-09 MED ORDER — ACETAMINOPHEN 160 MG/5ML PO SOLN: 650.0000 mg | Freq: Four times a day (QID) | ORAL | Status: DC | PRN

## 2014-06-09 MED ORDER — AMOXICILLIN-POT CLAVULANATE 600-42.9 MG/5ML PO SUSR: 875.0000 mg | Freq: Two times a day (BID) | ORAL | Status: DC

## 2014-06-09 MED ORDER — LEVETIRACETAM 100 MG/ML PO SOLN: 500.0000 mg | Freq: Every day | ORAL | Status: AC

## 2014-06-09 MED ORDER — JEVITY 1.2 CAL PO LIQD
1000.0000 mL | ORAL | Status: DC
Start: 2014-06-09 — End: 2014-11-15

## 2014-06-09 MED ORDER — LEVETIRACETAM 100 MG/ML PO SOLN: ORAL | Status: DC

## 2014-06-09 NOTE — Consult Note (Addendum)
Patient WE:XHBZJIR J Ovitt      DOB: 01-Dec-1945      CVE:[id number]     Consult Note from the Palliative Medicine Team at [facility] Requested by: Dr. 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  Patient was cared for her who life until her mother passed away. Just prior to that her mother asked the patient's sister to become her guardian and take care of her.  She was transferred to the group home where she has resided for the the last 20 yrs.  [name] reports that her sister has had very little sickness over the years.  She reports at this time in her life while she recognizes that she is aging like the rest of [place] that she would still want her to have 'life'. She reports for now she desires that we treat the treatable and return her to the hospital if needed.  She affirmed her DNR status. She asked about her catheter being removed. We talked about risks for infection weighed against skin breakdown from incontinence.  Goals of Care: 1.  Code Status: DNR; Reviewed MOST form with [name]. She and her husband are going to look over but for now : DNR; limited return to hospital , abx, iv fluids and tube feeds are the desired goals.     2. Scope of Treatment: Continue to treat infections. Continue tube feeds . Restore oral feeding for please if possible. Transition to SNF level of care from Group home.  4. Disposition: SNF . Recommend PCS to follow in out patient setting as palliative needs will continue to be present.  Offered to see patient again and empowered family to request consultation when she returns to the hospital in the future.   3. Symptom Management:   1. Peg Infection: asked for dressing change 2. Foreign Body in GI track -  long standing can not be accessed 3. Seizures: continue current meds- keppra and depakote 4. Multiple stools- on docusate, cdiff negative.  Hold docusate if continues to have copious soft stools. 5. Pleural effusions: received lasix, not symptomatic. Follow, likely iatrogenic related to sepsis but could have other source.  4. Psychosocial: patient has had limited cognition all her life. Sister relates she used to smile when she came to visit. She has been nonverbal her whole life but did use to hum to herself.  5. Spiritual: Sister is [place].   Patient Documents Completed or Given: Document Given Completed  Advanced Directives Pkt    MOST    DNR    Gone from My Sight    Hard Choices      Brief HPI: 68 yrs old [place] American female with cognitive delay and damage s/p meningitis. Patient was admitted with sepsis, feeding tube was placed and then we were asked to assist with goals of care....   ROS: unable to obtain due to cognitive issues.    PMH:  Past Medical History  Diagnosis Date  . Seizures   . Osteoporosis   . Thyroid disease     hypothyroid  . High cholesterol   . Cataracts, bilateral   . Optic atrophy   . Paraparesis     mild  . Hypothyroidism   . Anginal pain   . Coronary  artery disease      PSH: Past Surgical History  Procedure Laterality Date  . Midline incision    . Central venous catheter insertion  05/15/2014       . Esophagogastroduodenoscopy N/A 06/03/2014    Procedure: ESOPHAGOGASTRODUODENOSCOPY (EGD);  Surgeon: Beryle Beams, MD;  Location: [facility] ENDOSCOPY;  Service: Endoscopy;  Laterality: N/A;   I have reviewed the [place] and SH and  If appropriate update it with new information. Allergies  Allergen Reactions  . Carvedilol   . Ppd [Tuberculin Purified Protein Derivative]     Per MAR   Scheduled Meds: . amoxicillin-clavulanate  875 mg Per Tube BID  . antiseptic oral rinse  7 mL Mouth Rinse BID  . aspirin  81 mg Per Tube Daily  .  atorvastatin  10 mg Per Tube Daily  . chlorhexidine  15 mL Mouth Rinse BID  . docusate  200 mg Per Tube BID  . feeding supplement (JEVITY 1.2 CAL)  1,000 mL Per Tube Q24H  . free water  200 mL Per Tube 4 times per day  . levETIRAcetam  500 mg Per Tube Q0600  . levETIRAcetam  750 mg Per Tube BID  . levothyroxine  88 mcg Per Tube QAC breakfast  . Valproic Acid  750 mg Per Tube TID   Continuous Infusions:  PRN Meds:.acetaminophen (TYLENOL) oral liquid 160 mg/5 mL, albuterol    BP 148/72  Pulse 81  Temp(Src) 98.2 F (36.8 C) (Oral)  Resp 18  Ht 5' 1.81" (1.57 m)  Wt 65.59 kg (144 lb 9.6 oz)  BMI 26.61 kg/m2  SpO2 98%   PPS: 20%   Intake/Output Summary (Last 24 hours) at 06/09/14 1313 Last data filed at 06/09/14 0600  Gross per 24 hour  Intake 1851.25 ml  Output      0 ml  Net 1851.25 ml   LBM: x3         today            Physical Exam:  General: no acute distress, mouth chewing movements, will stop and try to focus to kind voice and touch, comforts herself by chewing on mitt, on and off sleeping. HEENT: bilateral cataracts present, Chest:   Decreased but clear anteriorly CVS: regular, S1, S2  Abdomen:obese soft not tender or distended, PEG with creamy drainage Ext: contractures of the upper and lower extremities Neuro:cognitively impaired but appears comfortable  Labs: CBC    Component Value Date/Time   WBC 9.7 06/08/2014 0715   RBC 3.47* 06/08/2014 0715   RBC 1.74* 05/15/2014 0740   HGB 10.3* 06/08/2014 0715   HCT 31.8* 06/08/2014 0715   PLT 164 06/08/2014 0715   MCV 91.6 06/08/2014 0715   MCH 29.7 06/08/2014 0715   MCHC 32.4 06/08/2014 0715   RDW 17.1* 06/08/2014 0715   LYMPHSABS 1.8 06/04/2014 0635   MONOABS 1.6* 06/04/2014 0635   EOSABS 0.0 06/04/2014 0635   BASOSABS 0.0 06/04/2014 0635     CMP     Component Value Date/Time   NA 147 06/09/2014 0557   K 4.0 06/09/2014 0557   CL 110 06/09/2014 0557   CO2 29 06/09/2014 0557   GLUCOSE 123* 06/09/2014 0557   BUN 6  06/09/2014 0557   CREATININE 0.51 06/09/2014 0557   CALCIUM 8.0* 06/09/2014 0557   PROT 6.1 05/31/2014 0520   ALBUMIN 1.2* 05/31/2014 0520   AST 13 05/31/2014 0520   ALT 9 05/31/2014 0520   ALKPHOS 200* 05/31/2014 0520   BILITOT  0.3 05/31/2014 0520   GFRNONAA >90 06/09/2014 0557   GFRAA >90 06/09/2014 0557    Chest Xray Reviewed/Impressions: Persistent infiltrate in LEFT lower lobe with improved aeration at  RIGHT base.  Bronchitic changes and suspect small LEFT pleural effusion.    CT scan of the Head Reviewed/Impressions: 1. Large left pleural effusion and moderate right pleural effusion.  2. Unusual foreign body in the stomach measuring 13 x 3 cm.  3. Ascites and anasarca with slight soft tissue stranding around the  pancreas which could represent mild pancreatitis.      Time In Time Out Total Time Spent with Patient Total Overall Time  1100 am 1200  50 min 60 min   Discussed with Dr. [place] Pink  Greater than 50%  of this time was spent counseling and coordinating care related to the above assessment and plan.  Lindee Leason L. Lovena Le, MD MBA The Palliative Medicine Team at [facility] Phone: [phone] Pager: [phone] ( Use team phone after hours)

## 2014-06-09 NOTE — Plan of Care (Signed)
Problem: Phase III Progression Outcomes Goal: Convert IV antibiotics to PO Outcome: Completed/Met Date Met:  06/09/14 Per peg

## 2014-06-09 NOTE — Care Management Note (Signed)
CARE MANAGEMENT NOTE 06/09/2014  Patient:  Dawn [name], Dawn [name]   Account Number:  [id number]  Date Initiated:  05/18/2014  Documentation initiated by:  Dawn [name],Dawn [name]  Subjective/Objective Assessment:   dx hypotension; resident of group home     Action/Plan:   06/09/2014 Pt for d/c to SNF, IM left at bedside for pt sister.   Anticipated DC Date:  06/09/2014   Anticipated DC Plan:  SKILLED NURSING FACILITY  In-house referral  Clinical Social Worker      DC Planning Services  CM consult      Choice offered to / List presented to:             Status of service:  Completed, signed off Medicare Important Message given?  YES (If response is "NO", the following Medicare IM given date fields will be blank) Date Medicare IM given:  05/24/2014 Medicare IM given by:  Dawn [name],Dawn [name] Date Additional Medicare IM given:  05/31/2014 Additional Medicare IM given by:  [facility] Dawn [name]  Discharge Disposition:    Per UR Regulation:  Reviewed for med. necessity/level of care/duration of stay  If discussed at [place] of Stay Meetings, dates discussed:   05/20/2014  05/25/2014  05/27/2014  06/01/2014    Comments:   06/09/2014 IM left at bedside for pt sister, pt for d/c to SNF.  CRoyal RN MPH, case manager, [phone]  05/31/14 [place] MSN BSN CCM PEG placed 8/14, SNF bed search initiated by CSW.  05/24/14 [place] MSN BSN CCM Pt's sister has chosen PEG placement.  CSW notified as pt will need SNF.  Sister requests [place] area if possible.  05/20/14 [place] MSN BSN CCM Talked with Dawn [name] with [employer].  Sister, Dawn [name] [phone]), is legal guardian.  Pt has NG and will need MBS to determine whether she will need PEG.  She will not be able to return to group home with PEG.  NP provided information to guardian.

## 2014-06-09 NOTE — Progress Notes (Signed)
Patient Discharge:  Patient discharged to [facility].  She was taken by the EMS with 2 EMS staff members accompanying.    Education: Given report to the nurse at [facility] about the patient. IV: Not applicable. Telemetry: Not applicable. Follow-up appointments:  Given a handout of follow up appointments, medications, and prescriptions and discharge instructions. Belongings: All belongings were sent with the patient to the facility.

## 2014-06-09 NOTE — Discharge Summary (Signed)
[facility]  Physician Discharge Summary   Patient ID: Dawn [name] MRN: [id number] DOB/AGE: 02/23/1946 68 y.o.  Admit date: 05/14/2014 Discharge date: 06/09/2014  PCP: [name]  DISCHARGE DIAGNOSES:  Principal Problem:   FUO (fever of unknown origin) Active Problems:   Seizure disorder   MR (mental retardation)   Hypothyroidism   HLD (hyperlipidemia)   Hypotension   Anemia   Diastolic heart failure, NYHA class 2   Paroxysmal a-fib   Dysphasia   UTI (lower urinary tract infection)   Tooth decay   RECOMMENDATIONS FOR OUTPATIENT FOLLOW UP: 1. Consider palliative care input at SNF. She was seen by our palliative care team.  2. See DC instructions 3. Please assess volume status and use Lasix judiciously. Check Bmet closely if on Lasix.   DISCHARGE CONDITION: poor  Diet recommendation: PEG feeds as per orders  Filed Weights   06/05/14 2010 06/06/14 2007 06/07/14 2029  Weight: 65.59 kg (144 lb 9.6 oz) 65.59 kg (144 lb 9.6 oz) 65.59 kg (144 lb 9.6 oz)    INITIAL HISTORY: 68 y.o. 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 Culture grew E coli and she completed IV antibiotics. She again developed fever-eventually attributed to PEG site infection-fevers resolved after Augmentin initiation. Status post EGD-old foreign body in the stomach and peg tube fixed. Treating for anasarca. Discussed with sister/guardian and palliative care consulted for goals of care-meeting on 8/26. Discussed with Dr. Lovena Le. Sister wants patient to go to SNF and continue current treatment.  Consultants:  IR- Dr. Kathlene Cote  GI/Dr. Collene Mares  ID- Dr. Scharlene Gloss.  Palliative care team  Procedures:  8/14 gastrostomy tube placed 56f   EGD 8/20:  IMPRESSION:  1) Hard rubber foreign object.  (Dr. HBenson Norwaydid remove a small portion of the object and identified it externally. It most likely absorbed fluid and expended in size.)  2) Buried bumper - Resolved.  3) Cellulitis around the PEG site. Active drainage of pus.  Antibiotics:  Zosyn 7/31>> stopped 8/03  Vancomycin 7/31>> stopped 8/03  Rocephin 8/03 >> stopped 8/07  Rocephin 8/11 > stopped 8/13  Augmentin 8/20 >   HOSPITAL COURSE:   Sepsis / septic shock due to pan sensitive E coli UTI  She completed a 7 day abx course for UTI. Patient developed fever again and initially clear source could not be identified. Please see discussion below under FUO.   Fever of Unknown Origin  As above. TSH elevated and hence not cause of this. Further investigations revealed that PEG site was infected. She was started on Augmentin. Temperatures have settled and leukocytosis has improved.   Foreign body in the stomach  This was seen on CT and she underwent EGD. It was apparently seen on previous CT's as well. Unclear if this will pass spontaneously. Patient not candidate for surgery. Follow clinically. May consider images periodically.   Right facial droop  Non contrasted CT Head normal 8/4. Suspect this is her baseline facies   Cerebral palsy / Acute metabolic encephalopathy / dysphagia/Seizure disorder  She has chronic dysphagia. Has abdominal binder post PEG procedure. Tolerating PEG feeds @ 517mhour. Continue Keppra and Depacon. Keppra level: 50.3 & Valproate level: 75.1   Hypernatremia with dehydration  Sodium had normalized only to increase 8/20, s/p a dose of IV lasix 8/19. Lasix was held. Continue free water via PEG. Sodium returned  to normal. She was then noted to retain volume. She was given 2 doses of IV lasix. She doesn't seem overtly fluid overloaded currently. Volume status can be assessed at SNF and she can be given further lasix as needed.  Symptomatic anemia  On 8/1 transfused 3 units PRBC. Hgb has been stable the last several  days.   Thrombocytopenia  Resolved   Chronic Diastolic heart failure/Anasarca/b/l Pleural effusions  Pleural fluids could be due to fluid overload. She is saturating well. She was given 2 doses of Lasix for fluid overload. UO was not monitored unfortunately. I/O is not accurate. SNF to assess volume status and use Lasix judiciously and get Bmet periodically. Not discharging on scheduled doses due to development of hypernatremia previously. Thoracentesis was initially considered as she was febrile but then as she improved it was not felt to be necessary.   Paroxysmal atrial fibrillation  Currrently sinus SR. Not on anticoagulation.  Hypocalcemia  Corrected calcium normal   Hypothyroid  Increased Synthroid to 88 mcg daily. Followup TSH/FT4 in 4-6 weeks.  Code Status: DNR  Patient has poor overall long term prognosis. Palliative medicine met with patient's sister. Patient is already DNR. Sister wants treatment for reversible processes. At this time patient is stable for discharge.   PERTINENT LABS: The results of significant diagnostics from this hospitalization (including imaging, microbiology, ancillary and laboratory) are listed below for reference.    Microbiology: Recent Results (from the past 240 hour(s))  URINE CULTURE     Status: None   Collection Time    05/30/14  5:16 PM      Result Value Ref Range Status   Specimen Description URINE, CATHETERIZED   Final   Special Requests NONE   Final   Culture  Setup Time     Final   Value: 05/31/2014 02:09     Performed at [facility]     Final   Value: NO GROWTH     Performed at [facility]   Culture     Final   Value: NO GROWTH     Performed at [facility]   Report Status 06/01/2014 FINAL   Final  CLOSTRIDIUM DIFFICILE BY PCR     Status: None   Collection Time    06/01/14  8:27 AM      Result Value Ref Range Status   C difficile by pcr NEGATIVE  NEGATIVE Final     Labs: Basic  Metabolic Panel:  Recent Labs Lab 06/04/14 0635 06/05/14 0449 06/06/14 0545 06/08/14 0715 06/09/14 0557  NA 151* 145 145 144 147  K 3.7 3.3* 3.7 3.7 4.0  CL 115* 109 110 106 110  CO2 _0 GLUCOSE 77 191* 127* 105* 123*  BUN _1 CREATININE 0.64 0.49* 0.56 0.46* 0.51  CALCIUM 8.0* 7.8* 7.8* 7.9* 8.0*   CBC:  Recent Labs Lab 06/03/14 0500 06/04/14 0635 06/05/14 0449 06/05/14 2125 06/06/14 0545 06/08/14 0715  WBC 19.5* 11.9* 8.2  --  9.4 9.7  NEUTROABS 14.6* 8.4*  --   --   --   --   HGB 7.5* 7.1* 7.0* 10.9* 10.5* 10.3*  HCT 22.2* 21.9* 20.8* 31.5* 30.5* 31.8*  MCV 91.4 95.2 91.6  --  87.1 91.6  PLT 250 198 170  --  160 164   BNP: BNP (last 3 results)  Recent Labs  10/28/13 1547 06/08/14 0715  PROBNP 488.2* 899.3*  CBG:  Recent Labs Lab 06/07/14 0406 06/08/14 1129 06/09/14 0459 06/09/14 0851 06/09/14 1217  GLUCAP 71 99 127* 121* 95     IMAGING STUDIES Ct Head Wo Contrast  05/18/2014   CLINICAL DATA:  New right facial droop. Recent hypotension. Difficulty swallowing.  EXAM: CT HEAD WITHOUT CONTRAST  TECHNIQUE: Contiguous axial images were obtained from the base of the skull through the vertex without intravenous contrast.  COMPARISON:  MRI of the brain dated 12/05/2007 and CT scan dated 05/16/2007  FINDINGS: There is no acute intracranial hemorrhage, infarction, or mass lesion. The patient has chronic severe cerebral cortical and cerebellar atrophy with chronic dilatation of the lateral ventricles.  The skull is diffusely thickened. There is partial opacification of the extensive mastoid air cells bilaterally.  IMPRESSION: No acute intracranial abnormality.  Prominent atrophy.   Electronically Signed   By: Rozetta Nunnery M.D.   On: 05/18/2014 11:14   Ct Chest W Contrast  06/01/2014   CLINICAL DATA:  Fever of unknown origin.  EXAM: CT CHEST, ABDOMEN, AND PELVIS WITH CONTRAST  TECHNIQUE: Multidetector CT imaging of the chest, abdomen and pelvis  was performed following the standard protocol during bolus administration of intravenous contrast.  CONTRAST:  6m OMNIPAQUE IOHEXOL 300 MG/ML  SOLN  COMPARISON:  CT scans dated 12/29/2010  FINDINGS: CT CHEST FINDINGS  There is a large left effusion and a slightly smaller with moderate right pleural effusion with secondary slight compressive atelectasis in both lower lobes. Overall heart size is normal. No acute osseous abnormality. Pulmonary vascularity appears normal.  CT ABDOMEN AND PELVIS FINDINGS  There is a 13 x 3 cm foreign body in the fundus and body of the stomach of unknown origin. Gastrostomy tube appears in good position.  The patient has a small amount of ascites with edema in the soft tissues around the fundus of the stomach. The small intestine and colon appear normal including the terminal ileum and appendix. There is a moderate amount of stool in the rectum. There is slight rectal prolapse.  The liver appears normal. Numerous surgical clips in the gallbladder fossa. No dilated bile ducts. The spleen is normal. There is soft tissue stranding around the distal body and tail of the pancreas. The possibility of pancreatitis should be considered.  The patient has chronic atrophy of the left kidney although the renal cortex has increased in the thickness since the prior CT scan. There is chronic dilatation of the left renal pelvis of the ureter is not dilated. There is compensatory hypertrophy of the right kidney.  Uterus ovaries are normal.  The patient has edema in the subcutaneous soft tissues consistent with anasarca. This could also represent cellulitis. The subcutaneous edema as asymmetric to the left side of the abdomen. The patient has low albumin in this probably accounts for the anasarca hand the ascites and probably the soft tissue stranding around the pancreas. Is the patient's lipase normal?  IMPRESSION: 1. Large left pleural effusion and moderate right pleural effusion. 2. Unusual foreign  body in the stomach measuring 13 x 3 cm. 3. Ascites and anasarca with slight soft tissue stranding around the pancreas which could represent mild pancreatitis.   Electronically Signed   By: JRozetta NunneryM.D.   On: 06/01/2014 20:40   Ct Abdomen Pelvis W Contrast  06/01/2014   CLINICAL DATA:  Fever of unknown origin.  EXAM: CT CHEST, ABDOMEN, AND PELVIS WITH CONTRAST  TECHNIQUE: Multidetector CT imaging of the chest, abdomen and pelvis was performed following  the standard protocol during bolus administration of intravenous contrast.  CONTRAST:  62m OMNIPAQUE IOHEXOL 300 MG/ML  SOLN  COMPARISON:  CT scans dated 12/29/2010  FINDINGS: CT CHEST FINDINGS  There is a large left effusion and a slightly smaller with moderate right pleural effusion with secondary slight compressive atelectasis in both lower lobes. Overall heart size is normal. No acute osseous abnormality. Pulmonary vascularity appears normal.  CT ABDOMEN AND PELVIS FINDINGS  There is a 13 x 3 cm foreign body in the fundus and body of the stomach of unknown origin. Gastrostomy tube appears in good position.  The patient has a small amount of ascites with edema in the soft tissues around the fundus of the stomach. The small intestine and colon appear normal including the terminal ileum and appendix. There is a moderate amount of stool in the rectum. There is slight rectal prolapse.  The liver appears normal. Numerous surgical clips in the gallbladder fossa. No dilated bile ducts. The spleen is normal. There is soft tissue stranding around the distal body and tail of the pancreas. The possibility of pancreatitis should be considered.  The patient has chronic atrophy of the left kidney although the renal cortex has increased in the thickness since the prior CT scan. There is chronic dilatation of the left renal pelvis of the ureter is not dilated. There is compensatory hypertrophy of the right kidney.  Uterus ovaries are normal.  The patient has edema in the  subcutaneous soft tissues consistent with anasarca. This could also represent cellulitis. The subcutaneous edema as asymmetric to the left side of the abdomen. The patient has low albumin in this probably accounts for the anasarca hand the ascites and probably the soft tissue stranding around the pancreas. Is the patient's lipase normal?  IMPRESSION: 1. Large left pleural effusion and moderate right pleural effusion. 2. Unusual foreign body in the stomach measuring 13 x 3 cm. 3. Ascites and anasarca with slight soft tissue stranding around the pancreas which could represent mild pancreatitis.   Electronically Signed   By: JRozetta NunneryM.D.   On: 06/01/2014 20:40   Ir Gastrostomy Tube Mod Sed  05/28/2014   CLINICAL DATA:  Dysphagia and aspiration. The patient requires a gastrostomy tube for nutritional needs.  EXAM: PERCUTANEOUS GASTROSTOMY TUBE PLACEMENT  ANESTHESIA/SEDATION: 2.0 mg IV Versed; 100 mcg IV Fentanyl.  Total Moderate Sedation Time  15 minutes.  CONTRAST:  165mOMNIPAQUE IOHEXOL 300 MG/ML  SOLN  MEDICATIONS: 2 g IV Ancef. As antibiotic prophylaxis, Ancef was ordered pre-procedure and administered intravenously within one hour of incision.  FLUOROSCOPY TIME:  5 minutes and 42 seconds.  PROCEDURE: The procedure, risks, benefits, and alternatives were explained to the patient's sister. Questions regarding the procedure were encouraged and answered. The patient's sister understands and consents to the procedure.  A 5-French catheter was then advanced through the the patient's mouth under fluoroscopy into the esophagus and to the level of the stomach. This catheter was used to insufflate the stomach with air under fluoroscopy.  The abdominal wall was prepped with Betadine in a sterile fashion, and a sterile drape was applied covering the operative field. A sterile gown and sterile gloves were used for the procedure. Local anesthesia was provided with 1% Lidocaine.  A skin incision was made in the upper  abdominal wall. Under fluoroscopy, an 18 gauge trocar needle was advanced into the stomach. Contrast injection was performed to confirm intraluminal position of the needle tip. A single T tack was then  deployed in the lumen of the stomach. This was brought up to tension at the skin surface.  Over a guidewire, a 9-French sheath was advanced into the lumen of the stomach. The wire was left in place as a safety wire. A loop snare device from a percutaneous gastrostomy kit was then advanced into the stomach.  A floppy guide wire was advanced through the orogastric catheter under fluoroscopy in the stomach. The loop snare advanced through the percutaneous gastric access was used to snare the guide wire. This allowed withdrawal of the loop snare out of the patient's mouth by retraction of the orogastric catheter and wire.  A 24-French bumper retention gastrostomy tube was looped around the snare device. It was then pulled back through the patient's mouth. The retention bumper was brought up to the anterior gastric wall. The T tack suture was cut at the skin. The exiting gastrostomy tube was cut to appropriate length and a feeding adapter applied. The catheter was injected with contrast material to confirm position and a fluoroscopic spot image saved. The tube was then flushed with saline. A dressing was applied over the gastrostomy exit site.  COMPLICATIONS: None.  FINDINGS: The stomach distended well with air allowing safe placement of the gastrostomy tube. After placement, the tip of the gastrostomy tube lies in the body of the stomach.  IMPRESSION: Percutaneous gastrostomy with placement of a 24-French bumper retention tube in the body of the stomach. This tube can be used for percutaneous feeds beginning in 24 hours after placement.   Electronically Signed   By: Aletta Edouard M.D.   On: 05/28/2014 17:51   Dg Chest Port 1 View  06/09/2014   CLINICAL DATA:  Pleural effusions.  EXAM: PORTABLE CHEST - 1 VIEW   COMPARISON:  06/07/2014.  FINDINGS: Trachea is midline.  Heart size normal.  Bilateral effusions, right greater than left, with mild basilar dependent airspace disease. Findings are similar to 06/07/2014. Percutaneous feeding tube projects over the left upper quadrant.  IMPRESSION: Bilateral pleural effusions with probable compressive atelectasis at the lung bases. Difficult to exclude pneumonia.   Electronically Signed   By: Lorin Picket M.D.   On: 06/09/2014 07:42   Dg Chest Port 1 View  06/07/2014   CLINICAL DATA:  Followup of pleural effusion.  EXAM: PORTABLE CHEST - 1 VIEW  COMPARISON:  06/05/2014  FINDINGS: Normal heart size. Small right pleural effusion, likely similar. Cannot exclude trace left pleural fluid. Low lung volumes. No pneumothorax. Mild interstitial edema. Improved right-sided aeration with lower lobe predominant airspace disease remaining.  IMPRESSION: Improved right-sided aeration with decreased atelectasis or infection.  Decrease in small right pleural effusion.  Mild interstitial edema, similar.   Electronically Signed   By: Abigail Miyamoto M.D.   On: 06/07/2014 07:53   Dg Chest Port 1 View  06/05/2014   CLINICAL DATA:  Followup pleural effusion  EXAM: PORTABLE CHEST - 1 VIEW  COMPARISON:  Chest CT 06/01/2014, CXR 05/30/2014  FINDINGS: Decreased lung volume compared with prior studies. Increase in diffuse density on the right due to layering effusion and airspace disease on the right. This suggests increase in right pleural effusion  Smaller left effusion and left lower lobe atelectasis appear similar.  IMPRESSION: Decreased lung volume compared with prior studies  Increased density on the right likely due layering effusion and Right lung compressive atelectasis.  Smaller left effusion and left lower lobe atelectasis unchanged.   Electronically Signed   By: Franchot Gallo M.D.  On: 06/05/2014 10:52   Dg Chest Port 1 View  05/30/2014   CLINICAL DATA:  Increasing leukocytosis, fever,  history coronary disease  EXAM: PORTABLE CHEST - 1 VIEW  COMPARISON:  Portable exam 1125 hr compared 05/24/2014  FINDINGS: Mild enlargement of cardiac silhouette.  Mediastinal contours and pulmonary vascularity normal.  Atelectasis versus consolidation LEFT lower lobe little changed.  Improved aeration at RIGHT base.  Upper lungs clear.  Central peribronchial thickening again identified.  Suspect small LEFT pleural effusion.  No pneumothorax.  IMPRESSION: Persistent infiltrate in LEFT lower lobe with improved aeration at RIGHT base.  Bronchitic changes and suspect small LEFT pleural effusion.   Electronically Signed   By: Lavonia Dana M.D.   On: 05/30/2014 11:42   Dg Chest Port 1 View  05/24/2014   CLINICAL DATA:  Fever, possible aspiration  EXAM: PORTABLE CHEST - 1 VIEW  COMPARISON:  05/21/2014  FINDINGS: No change in position of left central line or NG tube.  Limited inspiratory effect with elevated left diaphragm similar to prior study. Heart size is normal. Hazy opacity right lung base, mild and stable. Left, there is also hazy basilar opacity, stable. Although somewhat likely exaggerated by limited inspiratory effect, there appears to be mild vascular congestion.  IMPRESSION: Hazy bibasilar opacities unchanged from prior study.  Mild vascular congestion.   Electronically Signed   By: Skipper Cliche M.D.   On: 05/24/2014 15:38   Dg Chest Port 1 View  05/21/2014   CLINICAL DATA:  Wheezing  EXAM: PORTABLE CHEST - 1 VIEW  COMPARISON:  05/15/2014  FINDINGS: A left subclavian central venous line is again identified. A feeding tube is now seen within the stomach. The cardiac shadow is stable. Bibasilar atelectasis is noted as well as small pleural effusions bilaterally.  IMPRESSION: Bibasilar changes increased from the prior exam.   Electronically Signed   By: Inez Catalina M.D.   On: 05/21/2014 13:08   Dg Chest Port 1 View  05/15/2014   CLINICAL DATA:  Central line placement  EXAM: PORTABLE CHEST - 1 VIEW   COMPARISON:  None.  FINDINGS: Low lung volumes. No focal consolidation. No pleural effusion or pneumothorax.  The heart is normal in size.  Left subclavian venous catheter terminating at the cavoatrial junction.  IMPRESSION: Left subclavian venous catheter terminating at the cavoatrial junction.   Electronically Signed   By: Julian Hy M.D.   On: 05/15/2014 14:39   Dg Chest Portable 1 View  05/14/2014   CLINICAL DATA:  Decreased responsiveness  EXAM: PORTABLE CHEST - 1 VIEW  COMPARISON:  12/18/2013  FINDINGS: The heart size and mediastinal contours are within normal limits. Both lungs are clear. The visualized skeletal structures are unremarkable.  IMPRESSION: No active disease.   Electronically Signed   By: Inez Catalina M.D.   On: 05/14/2014 18:21   Dg Abd Portable 1v  05/28/2014   CLINICAL DATA:  68 year old female evaluate stool burden prior 2 percutaneous gastrostomy tube. Initial encounter.  EXAM: PORTABLE ABDOMEN - 1 VIEW  COMPARISON:  05/27/2014 and earlier.  FINDINGS: Portable AP supine view at 0455 hrs. Mild motion artifact. Mildly decreased volume of retained stool in the colon since the prior film. Abundant stool remains in the right colon, at the hepatic flexure, and at the splenic flexure. Bowel gas pattern is stable and non obstructed. Stable surgical clips. No acute osseous abnormality identified.  IMPRESSION: Only mildly decreased volume of retained stool since 05/27/2014.   Electronically Signed   By:  Lars Pinks M.D.   On: 05/28/2014 07:33   Dg Abd Portable 1v  05/27/2014   CLINICAL DATA:  Asses stool burden.  EXAM: PORTABLE ABDOMEN - 1 VIEW  COMPARISON:  Single view of the abdomen 05/25/2014 and 05/19/2014.  FINDINGS: Although there remains a large volume of stool in the colon, particularly the ascending and transverse, overall stool burden appears mildly improved. There is no evidence of bowel obstruction.  IMPRESSION: Large stool burden for cyst but appears mildly improved.    Electronically Signed   By: Inge Rise M.D.   On: 05/27/2014 11:16   Dg Abd Portable 1v  05/25/2014   CLINICAL DATA:  Feeding tube placement.  EXAM: PORTABLE ABDOMEN - 1 VIEW  COMPARISON:  Single view of the abdomen 05/19/2014.  FINDINGS: Feeding tube is in place with the tip projecting in the fundus of the stomach. There is a massive volume of stool in the ascending and transverse colon. The bowel gas pattern is nonobstructive.  IMPRESSION: Feeding tube tip is in the fundus of the stomach.  Massive volume of stool ascending and transverse colon.   Electronically Signed   By: Inge Rise M.D.   On: 05/25/2014 08:00   Dg Abd Portable 1v  05/19/2014   CLINICAL DATA:  Feeding tube placement.  EXAM: PORTABLE ABDOMEN - 1 VIEW  COMPARISON:  05/19/2014  FINDINGS: A feeding tube remains in place with the tip in the fundus of the stomach. No evidence of dilated bowel loops. Elevation of left hemidiaphragm again noted.  IMPRESSION: Feeding tube tip in fundus of stomach.   Electronically Signed   By: Earle Gell M.D.   On: 05/19/2014 14:31   Dg Abd Portable 1v  05/19/2014   CLINICAL DATA:  Feeding tube placement  EXAM: PORTABLE ABDOMEN - 1 VIEW  COMPARISON:  None.  FINDINGS: Feeding tube tip is in the stomach. The bowel gas pattern is unremarkable. No obstruction or free air is seen on this supine examination.  IMPRESSION: Feeding tube tip in stomach.   Electronically Signed   By: Lowella Grip M.D.   On: 05/19/2014 12:23    DISCHARGE EXAMINATION: Filed Vitals:   06/08/14 2034 06/09/14 0008 06/09/14 0502 06/09/14 1000  BP: 94/53 100/79 141/62 148/72  Pulse: 103 98 96 81  Temp: 100 F (37.8 C) 99.3 F (37.4 C) 98.5 F (36.9 C) 98.2 F (36.8 C)  TempSrc: Oral Oral Oral Oral  Resp: _0 Height:      Weight:      SpO2: 96% 92% 100% 98%   General appearance: non verbal. not cooperative. Resp: decreased air entry at bases. no wheezing. Cardio: regular rate and rhythm, S1, S2 normal,  no murmur, click, rub or gallop GI: soft, non-tender; bowel sounds normal; no masses,  no organomegaly Neurologic: Non verbal. MR at baseline.  DISPOSITION: SNF [facility]  Discharge Instructions   Discharge instructions    Complete by:  As directed   Please give all oral medications via PEG tube. Continue PEG feeds as mentioned. Please flush PEG tube as ordered. Please change dressing around PEG tube for leakage. Consider palliative care follow up at SNF.     Increase activity slowly    Complete by:  As directed            ALLERGIES:  Allergies  Allergen Reactions  . Carvedilol   . Ppd [Tuberculin Purified Protein Derivative]     Per [facility]    Current Discharge Medication  List    START taking these medications   Details  acetaminophen (TYLENOL) 160 MG/5ML solution Place 20.3 mLs (650 mg total) into feeding tube every 6 (six) hours as needed for mild pain, headache or fever. Qty: 120 mL, Refills: 0    albuterol (PROVENTIL) (2.5 MG/3ML) 0.083% nebulizer solution Take 3 mLs (2.5 mg total) by nebulization every 6 (six) hours as needed for wheezing or shortness of breath. Qty: 75 mL, Refills: 12    amoxicillin-clavulanate (AUGMENTIN) 600-42.9 MG/5ML suspension Place 7.3 mLs (875 mg total) into feeding tube 2 (two) times daily. For 7 more days Qty: 200 mL, Refills: 0    aspirin EC 81 MG tablet Take 1 tablet (81 mg total) by mouth daily.    docusate (COLACE) 50 MG/5ML liquid Place 20 mLs (200 mg total) into feeding tube 2 (two) times daily. Qty: 100 mL, Refills: 0    !! levETIRAcetam (KEPPRA) 100 MG/ML solution Place 5 mLs (500 mg total) into feeding tube daily at 6 (six) AM. Qty: 473 mL, Refills: 12    !! levETIRAcetam (KEPPRA) 100 MG/ML solution 782m every day at 1400hrs and 2000hrs Qty: 473 mL, Refills: 12    Nutritional Supplements (FEEDING SUPPLEMENT, JEVITY 1.2 CAL,) LIQD Place 1,000 mLs into feeding tube daily. At 566mhr Refills: 0    Valproic Acid (DEPAKENE) 250  MG/5ML SYRP syrup Place 15 mLs (750 mg total) into feeding tube 3 (three) times daily. Qty: 600 mL    Water For Irrigation, Sterile (FREE WATER) SOLN Place 200 mLs into feeding tube every 6 (six) hours.     !! - Potential duplicate medications found. Please discuss with provider.    CONTINUE these medications which have CHANGED   Details  levothyroxine (SYNTHROID, LEVOTHROID) 88 MCG tablet Take 1 tablet (88 mcg total) by mouth daily.      CONTINUE these medications which have NOT CHANGED   Details  atorvastatin (LIPITOR) 10 MG tablet Take 10 mg by mouth daily.    calcium-vitamin D (OYSTER CALCIUM 500 + D) 500-200 MG-UNIT per tablet Take 1 tablet by mouth daily.    polyethylene glycol (MIRALAX / GLYCOLAX) packet Take 17 g by mouth daily.    vitamin C (ASCORBIC ACID) 500 MG tablet Take 500 mg by mouth 3 (three) times daily.       STOP taking these medications     aspirin 81 MG chewable tablet      divalproex (DEPAKOTE ER) 250 MG 24 hr tablet      levETIRAcetam (KEPPRA) 500 MG tablet      LORazepam (ATIVAN) 2 MG tablet          TOTAL DISCHARGE TIME: 35 mins  [facility] Pager [phone]/26/2015, 12:51 PM  Disclaimer: This note was dictated with voice recognition software. Similar sounding words can inadvertently be transcribed and may not be corrected upon review.

## 2014-06-09 NOTE — Plan of Care (Signed)
Problem: Phase III Progression Outcomes Goal: Tolerating diet Outcome: Not Applicable Date Met:  48/25/00 ON continuous feeding, NPO  Problem: Discharge Progression Outcomes Goal: Tolerating diet Outcome: Not Applicable Date Met:  37/04/88 NPO, Continuous feeding Peg tube

## 2014-06-10 LAB — GLUCOSE, CAPILLARY
GLUCOSE-CAPILLARY: 109 mg/dL — AB (ref 70–99)
GLUCOSE-CAPILLARY: 118 mg/dL — AB (ref 70–99)
GLUCOSE-CAPILLARY: 89 mg/dL (ref 70–99)
Glucose-Capillary: 102 mg/dL — ABNORMAL HIGH (ref 70–99)
Glucose-Capillary: 103 mg/dL — ABNORMAL HIGH (ref 70–99)
Glucose-Capillary: 108 mg/dL — ABNORMAL HIGH (ref 70–99)
Glucose-Capillary: 87 mg/dL (ref 70–99)
Glucose-Capillary: 87 mg/dL (ref 70–99)
Glucose-Capillary: 92 mg/dL (ref 70–99)
Glucose-Capillary: 92 mg/dL (ref 70–99)
Glucose-Capillary: 98 mg/dL (ref 70–99)

## 2014-06-11 ENCOUNTER — Non-Acute Institutional Stay (SKILLED_NURSING_FACILITY): Payer: Medicare Other | Admitting: Internal Medicine

## 2014-06-11 DIAGNOSIS — A419 Sepsis, unspecified organism: Secondary | ICD-10-CM

## 2014-06-11 DIAGNOSIS — E0789 Other specified disorders of thyroid: Secondary | ICD-10-CM

## 2014-06-11 DIAGNOSIS — I480 Paroxysmal atrial fibrillation: Secondary | ICD-10-CM

## 2014-06-11 DIAGNOSIS — G40909 Epilepsy, unspecified, not intractable, without status epilepticus: Secondary | ICD-10-CM

## 2014-06-11 DIAGNOSIS — R29810 Facial weakness: Secondary | ICD-10-CM

## 2014-06-11 DIAGNOSIS — A4151 Sepsis due to Escherichia coli [E. coli]: Secondary | ICD-10-CM

## 2014-06-11 DIAGNOSIS — I5030 Unspecified diastolic (congestive) heart failure: Secondary | ICD-10-CM

## 2014-06-11 DIAGNOSIS — R4702 Dysphasia: Secondary | ICD-10-CM

## 2014-06-11 DIAGNOSIS — E870 Hyperosmolality and hypernatremia: Secondary | ICD-10-CM

## 2014-06-11 DIAGNOSIS — R509 Fever, unspecified: Secondary | ICD-10-CM

## 2014-06-11 DIAGNOSIS — I4891 Unspecified atrial fibrillation: Secondary | ICD-10-CM

## 2014-06-11 DIAGNOSIS — F79 Unspecified intellectual disabilities: Secondary | ICD-10-CM

## 2014-06-11 DIAGNOSIS — E038 Other specified hypothyroidism: Secondary | ICD-10-CM

## 2014-06-11 DIAGNOSIS — R4789 Other speech disturbances: Secondary | ICD-10-CM

## 2014-06-11 DIAGNOSIS — E034 Atrophy of thyroid (acquired): Secondary | ICD-10-CM

## 2014-06-11 DIAGNOSIS — Z931 Gastrostomy status: Secondary | ICD-10-CM

## 2014-06-11 NOTE — Progress Notes (Signed)
MRN: [id number] Name: Dawn [name]  Sex: female Age: 68 y.o. DOB: 1945/11/19  [place] #: [facility] Facility/Room: 311D Level Of Care: SNF Provider: Inocencio Homes D Emergency Contacts: Extended Emergency Contact Information Primary Emergency Contact: Sellars,[name] Address: [address]          [place] [address] [place] Phone: [phone] Relation: Sister Secondary Emergency Contact: Timya Cower [place] Phone: [phone] Relation: None  Code Status: DNR; treat reversible causes  Allergies: Carvedilol and Ppd  Chief Complaint  Patient presents with  . New Admit To SNF    HPI: Patient is 68 y.o. female who Has MR, seizures and CHF who is admitted to SNF s/p urosepsis followed by FUO.  Past Medical History  Diagnosis Date  . Seizures   . Osteoporosis   . Thyroid disease     hypothyroid  . High cholesterol   . Cataracts, bilateral   . Optic atrophy   . Paraparesis     mild  . Hypothyroidism   . Anginal pain   . Coronary artery disease     Past Surgical History  Procedure Laterality Date  . Midline incision    . Central venous catheter insertion  05/15/2014       . Esophagogastroduodenoscopy N/A 06/03/2014    Procedure: ESOPHAGOGASTRODUODENOSCOPY (EGD);  Surgeon: Beryle Beams, MD;  Location: [facility] ENDOSCOPY;  Service: Endoscopy;  Laterality: N/A;      Medication List       This list is accurate as of: 06/11/14 11:59 PM.  Always use your most recent med list.               acetaminophen 160 MG/5ML solution  Commonly known as:  TYLENOL  Place 20.3 mLs (650 mg total) into feeding tube every 6 (six) hours as needed for mild pain, headache or fever.     albuterol (2.5 MG/3ML) 0.083% nebulizer solution  Commonly known as:  PROVENTIL  Take 3 mLs (2.5 mg total) by nebulization every 6 (six) hours as needed for wheezing or shortness of breath.     amoxicillin-clavulanate 600-42.9 MG/5ML suspension  Commonly  known as:  AUGMENTIN  Place 7.3 mLs (875 mg total) into feeding tube 2 (two) times daily. For 7 more days     aspirin EC 81 MG tablet  Take 1 tablet (81 mg total) by mouth daily.     atorvastatin 10 MG tablet  Commonly known as:  LIPITOR  Take 10 mg by mouth daily.     docusate 50 MG/5ML liquid  Commonly known as:  COLACE  Place 20 mLs (200 mg total) into feeding tube 2 (two) times daily.     feeding supplement (JEVITY 1.2 CAL) Liqd  Place 1,000 mLs into feeding tube daily. At 86m/hr     free water Soln  Place 200 mLs into feeding tube every 6 (six) hours.     levETIRAcetam 100 MG/ML solution  Commonly known as:  KEPPRA  Place 5 mLs (500 mg total) into feeding tube daily at 6 (six) AM.     levETIRAcetam 100 MG/ML solution  Commonly known as:  KEPPRA  7574mevery day at 1400hrs and 2000hrs     levothyroxine 88 MCG tablet  Commonly known as:  SYNTHROID, LEVOTHROID  Take 1 tablet (88 mcg total) by mouth daily.     OYSTER CALCIUM 500 + D 500-200 MG-UNIT per tablet  Generic drug:  calcium-vitamin D  Take 1 tablet by mouth daily.  polyethylene glycol packet  Commonly known as:  MIRALAX / GLYCOLAX  Take 17 g by mouth daily.     Valproic Acid 250 MG/5ML Syrp syrup  Commonly known as:  DEPAKENE  Place 15 mLs (750 mg total) into feeding tube 3 (three) times daily.     vitamin C 500 MG tablet  Commonly known as:  ASCORBIC ACID  Take 500 mg by mouth 3 (three) times daily.        No orders of the defined types were placed in this encounter.     There is no immunization history on file for this patient.  History  Substance Use Topics  . Smoking status: Never Smoker   . Smokeless tobacco: Never Used  . Alcohol Use: No    Family history is noncontributory    Review of Systems  UTO - pt non verbal MR     Filed Vitals:   06/12/14 1009  BP: 140/71  Pulse: 80  Temp: 96.9 F (36.1 C)  Resp: 16    Physical Exam  GENERAL APPEARANCE: Alert,non  conversant SKIN: No diaphoresis rash HEAD: Normocephalic, atraumatic  EYES: Conjunctiva/lids clear. Pupils round, reactive. EOMs intact.  EARS: External exam WNL, canals clear. Hearing grossly normal.  NOSE: No deformity or discharge.  MOUTH/THROAT: Lips w/o lesions. RESPIRATORY: Breathing is even, unlabored. Lung sounds are clear   CARDIOVASCULAR: Heart RRR no murmurs, rubs or gallops. Pedal edema.   GASTROINTESTINAL: Abdomen is soft, non-tender, not distended w/ normal bowel sounds;PEG GENITOURINARY: Bladder non tender, not distended  MUSCULOSKELETAL: legs contractured in flexion; BUE contractures NEUROLOGIC:  Pt's eyes were open but pt didn't respond to verbal or visual; pt moved L arm a little PSYCHIATRIC: flat affect  Patient Active Problem List   Diagnosis Date Noted  . Sepsis 06/12/2014  . Facial droop 06/12/2014  . Hypernatremia 06/12/2014  . PEG (percutaneous endoscopic gastrostomy) status 06/12/2014  . FUO (fever of unknown origin) 06/02/2014  . Paroxysmal a-fib 05/14/2014  . Dysphasia 05/14/2014  . UTI (lower urinary tract infection) 05/14/2014  . Tooth decay 05/14/2014  . SIRS (systemic inflammatory response syndrome) 12/15/2013  . Thrombocytopenia 10/29/2013  . Hypotension 10/29/2013  . Anemia 10/29/2013  . Diastolic heart failure, NYHA class 2 10/29/2013  . Atrial fibrillation with rapid ventricular response 10/28/2013  . Seizure disorder 10/28/2013  . MR (mental retardation) 10/28/2013  . Hypothyroidism 10/28/2013  . HLD (hyperlipidemia) 10/28/2013  . Non-ST elevation myocardial infarction (NSTEMI) 10/28/2013    CBC    Component Value Date/Time   WBC 9.7 06/08/2014 0715   RBC 3.47* 06/08/2014 0715   RBC 1.74* 05/15/2014 0740   HGB 10.3* 06/08/2014 0715   HCT 31.8* 06/08/2014 0715   PLT 164 06/08/2014 0715   MCV 91.6 06/08/2014 0715   LYMPHSABS 1.8 06/04/2014 0635   MONOABS 1.6* 06/04/2014 0635   EOSABS 0.0 06/04/2014 0635   BASOSABS 0.0 06/04/2014 0635     CMP     Component Value Date/Time   NA 147 06/09/2014 0557   K 4.0 06/09/2014 0557   CL 110 06/09/2014 0557   CO2 29 06/09/2014 0557   GLUCOSE 123* 06/09/2014 0557   BUN 6 06/09/2014 0557   CREATININE 0.51 06/09/2014 0557   CALCIUM 8.0* 06/09/2014 0557   PROT 6.1 05/31/2014 0520   ALBUMIN 1.2* 05/31/2014 0520   AST 13 05/31/2014 0520   ALT 9 05/31/2014 0520   ALKPHOS 200* 05/31/2014 0520   BILITOT 0.3 05/31/2014 0520   GFRNONAA >90 06/09/2014 9323  GFRAA >90 06/09/2014 0557    Assessment and Plan  Sepsis Due to pansensitive E. Coli;tx with abx for 7 days  FUO (fever of unknown origin) Patient developed fever again and initially clear source could not be identified.TSH elevated and hence not cause of this. Further investigations revealed that PEG site was infected. She was started on Augmentin. Temperatures have settled and leukocytosis has improved    Facial droop Non contrasted CT Head normal 8/4. Suspect this is her baseline facies    Dysphasia She has chronic dysphagia. Has abdominal binder post PEG procedure. Tolerating PEG feeds @ 7m/hour   Seizure disorder Continue Keppra and Depacon. Keppra level: 50.3 & Valproate level: 75.1    Hypernatremia Sodium had normalized only to increase 8/20, s/p a dose of IV lasix 8/19. Lasix was held. Continue free water via PEG. Sodium returned to normal. She was then noted to retain volume. She was given 2 doses of IV lasix. She doesn't seem overtly fluid overloaded currently. Volume status can be assessed at SNF and she can be given further lasix as needed   Anemia On 8/1 transfused 3 units PRBC. Hgb has been stable the last several days   Diastolic heart failure, NYHA class 2 Pleural fluids could be due to fluid overload. She is saturating well. She was given 2 doses of Lasix for fluid overload. UO was not monitored unfortunately. I/O is not accurate. SNF to assess volume status and use Lasix judiciously and get Bmet periodically.  Not discharging on scheduled doses due to development of hypernatremia previously. Thoracentesis was initially considered as she was febrile but then as she improved it was not felt to be necessary   Hypothyroidism Increased Synthroid to 88 mcg daily. Followup TSH/FT4 in 4-6 weeks   Paroxysmal a-fib In NSR; ASA for prophylAXIS;not candidate for anticoagulation  MR (mental retardation) Patient has poor overall long term prognosis. Palliative medicine met with patient's sister. Patient is already DNR. Sister wants treatment for reversible processes. At this time patient is stable for discharge.   PEG (percutaneous endoscopic gastrostomy) status PEG tube ensures that pt's very poor quality of life can be extended artificially for a long time.    AHennie Duos MD

## 2014-06-12 ENCOUNTER — Encounter: Payer: Self-pay | Admitting: Internal Medicine

## 2014-06-12 DIAGNOSIS — Z931 Gastrostomy status: Secondary | ICD-10-CM | POA: Insufficient documentation

## 2014-06-12 NOTE — Assessment & Plan Note (Signed)
Pleural fluids could be due to fluid overload. She is saturating well. She was given 2 doses of Lasix for fluid overload. UO was not monitored unfortunately. I/O is not accurate. SNF to assess volume status and use Lasix judiciously and get Bmet periodically. Not discharging on scheduled doses due to development of hypernatremia previously. Thoracentesis was initially considered as she was febrile but then as she improved it was not felt to be necessary

## 2014-06-12 NOTE — Assessment & Plan Note (Signed)
In NSR; ASA for prophylAXIS;not candidate for anticoagulation

## 2014-06-12 NOTE — Assessment & Plan Note (Signed)
Increased Synthroid to 88 mcg daily. Followup TSH/FT4 in 4-6 weeks

## 2014-06-12 NOTE — Assessment & Plan Note (Signed)
Due to pansensitive E. Coli;tx with abx for 7 days

## 2014-06-12 NOTE — Assessment & Plan Note (Signed)
Patient developed fever again and initially clear source could not be identified.TSH elevated and hence not cause of this. Further investigations revealed that PEG site was infected. She was started on Augmentin. Temperatures have settled and leukocytosis has improved

## 2014-06-12 NOTE — Assessment & Plan Note (Signed)
Non contrasted CT Head normal 8/4. Suspect this is her baseline facies

## 2014-06-12 NOTE — Assessment & Plan Note (Addendum)
PEG tube ensures that pt's very poor quality of life can be extended artificially for a long time.

## 2014-06-12 NOTE — Assessment & Plan Note (Signed)
She has chronic dysphagia. Has abdominal binder post PEG procedure. Tolerating PEG feeds @ 50ml/hour

## 2014-06-12 NOTE — Assessment & Plan Note (Signed)
Continue Keppra and Depacon. Keppra level: 50.3 & Valproate level: 75.1

## 2014-06-12 NOTE — Assessment & Plan Note (Signed)
Sodium had normalized only to increase 8/20, s/p a dose of IV lasix 8/19. Lasix was held. Continue free water via PEG. Sodium returned to normal. She was then noted to retain volume. She was given 2 doses of IV lasix. She doesn't seem overtly fluid overloaded currently. Volume status can be assessed at SNF and she can be given further lasix as needed

## 2014-06-12 NOTE — Assessment & Plan Note (Signed)
On 8/1 transfused 3 units PRBC. Hgb has been stable the last several days

## 2014-06-12 NOTE — Assessment & Plan Note (Addendum)
Patient has poor overall long term prognosis. Palliative medicine met with patient's sister. Patient is already DNR. Sister wants treatment for reversible processes. At this time patient is stable for discharge.

## 2014-06-16 ENCOUNTER — Non-Acute Institutional Stay (SKILLED_NURSING_FACILITY): Payer: Medicare Other | Admitting: Internal Medicine

## 2014-06-16 ENCOUNTER — Encounter: Payer: Self-pay | Admitting: Internal Medicine

## 2014-06-16 DIAGNOSIS — Z931 Gastrostomy status: Secondary | ICD-10-CM

## 2014-06-16 DIAGNOSIS — I5030 Unspecified diastolic (congestive) heart failure: Secondary | ICD-10-CM

## 2014-06-16 DIAGNOSIS — R509 Fever, unspecified: Secondary | ICD-10-CM

## 2014-06-16 DIAGNOSIS — A4151 Sepsis due to Escherichia coli [E. coli]: Secondary | ICD-10-CM

## 2014-06-16 DIAGNOSIS — D638 Anemia in other chronic diseases classified elsewhere: Secondary | ICD-10-CM

## 2014-06-16 DIAGNOSIS — I480 Paroxysmal atrial fibrillation: Secondary | ICD-10-CM

## 2014-06-16 DIAGNOSIS — F79 Unspecified intellectual disabilities: Secondary | ICD-10-CM

## 2014-06-16 DIAGNOSIS — G40909 Epilepsy, unspecified, not intractable, without status epilepticus: Secondary | ICD-10-CM

## 2014-06-16 DIAGNOSIS — I4891 Unspecified atrial fibrillation: Secondary | ICD-10-CM

## 2014-06-16 DIAGNOSIS — A419 Sepsis, unspecified organism: Secondary | ICD-10-CM

## 2014-06-16 NOTE — Assessment & Plan Note (Signed)
Noted  

## 2014-06-16 NOTE — Assessment & Plan Note (Signed)
noted 

## 2014-06-16 NOTE — Progress Notes (Signed)
MRN: [id number] Name: Dawn [name]  Sex: female Age: 68 y.o. DOB: 1946/02/13  [place] [name] Facility/Room:311 Level Of Care: SNF Provider: Inocencio Homes D Emergency Contacts: Extended Emergency Contact Information Primary Emergency Contact: Sellars,[name] Address: [address]          [place] [address] [place] Phone: [phone] Relation: Sister Secondary Emergency Contact: Rakel Cower [place] of [place] Phone: [phone] Relation: None  Code Status: DNR  Allergies: Carvedilol and Ppd  Chief Complaint  Patient presents with  . Acute Visit    HPI: Patient is 68 y.o. 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  Past Medical History  Diagnosis Date  . Seizures   . Osteoporosis   . Thyroid disease     hypothyroid  . High cholesterol   . Cataracts, bilateral   . Optic atrophy   . Paraparesis     mild  . Hypothyroidism   . Anginal pain   . Coronary artery disease     Past Surgical History  Procedure Laterality Date  . Midline incision    . Central venous catheter insertion  05/15/2014       . Esophagogastroduodenoscopy N/A 06/03/2014    Procedure: ESOPHAGOGASTRODUODENOSCOPY (EGD);  Surgeon: Beryle Beams, MD;  Location: [facility] ENDOSCOPY;  Service: Endoscopy;  Laterality: N/A;      Medication List       This list is accurate as of: 06/16/14  1:16 PM.  Always use your most recent med list.               acetaminophen 160 MG/5ML solution  Commonly known as:  TYLENOL  Place 20.3 mLs (650 mg total) into feeding tube every 6 (six) hours as needed for mild pain, headache or fever.     albuterol (2.5 MG/3ML) 0.083% nebulizer solution  Commonly known as:  PROVENTIL  Take 3 mLs (2.5 mg total) by nebulization every 6 (six) hours as needed for wheezing or shortness of breath.     amoxicillin-clavulanate 600-42.9 MG/5ML suspension  Commonly known as:   AUGMENTIN  Place 7.3 mLs (875 mg total) into feeding tube 2 (two) times daily. For 7 more days     aspirin EC 81 MG tablet  Take 1 tablet (81 mg total) by mouth daily.     atorvastatin 10 MG tablet  Commonly known as:  LIPITOR  Take 10 mg by mouth daily.     docusate 50 MG/5ML liquid  Commonly known as:  COLACE  Place 20 mLs (200 mg total) into feeding tube 2 (two) times daily.     feeding supplement (JEVITY 1.2 CAL) Liqd  Place 1,000 mLs into feeding tube daily. At 56ml/hr     free water Soln  Place 200 mLs into feeding tube every 6 (six) hours.     levETIRAcetam 100 MG/ML solution  Commonly known as:  KEPPRA  Place 5 mLs (500 mg total) into feeding tube daily at 6 (six) AM.     levETIRAcetam 100 MG/ML solution  Commonly known as:  KEPPRA  750mg  every day at 1400hrs and 2000hrs     levothyroxine 88 MCG tablet  Commonly known as:  SYNTHROID, LEVOTHROID  Take 1 tablet (88 mcg total) by mouth daily.     OYSTER CALCIUM 500 + D 500-200 MG-UNIT per tablet  Generic drug:  calcium-vitamin D  Take 1 tablet by mouth daily.  polyethylene glycol packet  Commonly known as:  MIRALAX / GLYCOLAX  Take 17 g by mouth daily.     Valproic Acid 250 MG/5ML Syrp syrup  Commonly known as:  DEPAKENE  Place 15 mLs (750 mg total) into feeding tube 3 (three) times daily.     vitamin C 500 MG tablet  Commonly known as:  ASCORBIC ACID  Take 500 mg by mouth 3 (three) times daily.        No orders of the defined types were placed in this encounter.     There is no immunization history on file for this patient.  History  Substance Use Topics  . Smoking status: Never Smoker   . Smokeless tobacco: Never Used  . Alcohol Use: No    Review of Systems   UTO-pt baseline non conv and dec MS-per nursing fever and today is last day of augmentin from pt's hosp for urosepsis.  Danley Danker Vitals:   06/16/14 1308  BP: 136/64  Pulse: 113  Temp: 101.5 F (38.6 C)  Resp: 20    Physical  Exam  GENERAL APPEARANCE:Mod  Alert, nonconversant  SKIN: No diaphoresis rash; peg site looks OK HEENT: Unremarkable RESPIRATORY: Breathing is even, unlabored, RR inc. Lung sounds are coarse   CARDIOVASCULAR: Heart RRR no murmurs, rubs or gallops. No peripheral edema  GASTROINTESTINAL: Abdomen is soft, non-tender, not distended w/ normal bowel sounds.  GENITOURINARY: Bladder non tender, not distended  MUSCULOSKELETAL: contractures NEUROLOGIC: Cranial nerves 2-12 grossly intact; have not seen pt use arms or legs PSYCHIATRIC: depressed, MR no behavioral issues  Patient Active Problem List   Diagnosis Date Noted  . Sepsis 06/12/2014  . Facial droop 06/12/2014  . Hypernatremia 06/12/2014  . PEG (percutaneous endoscopic gastrostomy) status 06/12/2014  . FUO (fever of unknown origin) 06/02/2014  . Paroxysmal a-fib 05/14/2014  . Dysphasia 05/14/2014  . UTI (lower urinary tract infection) 05/14/2014  . Tooth decay 05/14/2014  . SIRS (systemic inflammatory response syndrome) 12/15/2013  . Thrombocytopenia 10/29/2013  . Hypotension 10/29/2013  . Anemia of chronic disease 10/29/2013  . Diastolic heart failure, NYHA class 2 10/29/2013  . Atrial fibrillation with rapid ventricular response 10/28/2013  . Seizure disorder 10/28/2013  . MR (mental retardation) 10/28/2013  . Hypothyroidism 10/28/2013  . HLD (hyperlipidemia) 10/28/2013  . Non-ST elevation myocardial infarction (NSTEMI) 10/28/2013    CBC    Component Value Date/Time   WBC 9.7 06/08/2014 0715   RBC 3.47* 06/08/2014 0715   RBC 1.74* 05/15/2014 0740   HGB 10.3* 06/08/2014 0715   HCT 31.8* 06/08/2014 0715   PLT 164 06/08/2014 0715   MCV 91.6 06/08/2014 0715   LYMPHSABS 1.8 06/04/2014 0635   MONOABS 1.6* 06/04/2014 0635   EOSABS 0.0 06/04/2014 0635   BASOSABS 0.0 06/04/2014 0635    CMP     Component Value Date/Time   NA 147 06/09/2014 0557   K 4.0 06/09/2014 0557   CL 110 06/09/2014 0557   CO2 29 06/09/2014 0557   GLUCOSE 123*  06/09/2014 0557   BUN 6 06/09/2014 0557   CREATININE 0.51 06/09/2014 0557   CALCIUM 8.0* 06/09/2014 0557   PROT 6.1 05/31/2014 0520   ALBUMIN 1.2* 05/31/2014 0520   AST 13 05/31/2014 0520   ALT 9 05/31/2014 0520   ALKPHOS 200* 05/31/2014 0520   BILITOT 0.3 05/31/2014 0520   GFRNONAA >90 06/09/2014 0557   GFRAA >90 06/09/2014 0557    Assessment and Plan  NEW ONSET FEVER - pt with  all problems listed below just had temp 101.5; Pt is on her last day of augmentin for urosepsis. Of note , pt has FUO in hospital. Pt is at risk for multiple problems. Pt does have inc work breathing and is at risk for aspiration PNA so CXR ordered. Also  U/A, CBC and BMP. PEG site doesn't look like the source.Tylenol was given and fever beginning to fall.  Diastolic heart failure, NYHA class 2 Noted  Paroxysmal a-fib noted  Seizure disorder noted  Anemia of chronic disease noted  MR (mental retardation) noted  PEG (percutaneous endoscopic gastrostomy) status noted  Sepsis noted    Hennie Duos, MD

## 2014-06-17 ENCOUNTER — Non-Acute Institutional Stay (SKILLED_NURSING_FACILITY): Payer: Medicare Other | Admitting: Internal Medicine

## 2014-06-17 DIAGNOSIS — J181 Lobar pneumonia, unspecified organism: Principal | ICD-10-CM

## 2014-06-17 DIAGNOSIS — J189 Pneumonia, unspecified organism: Secondary | ICD-10-CM

## 2014-06-17 DIAGNOSIS — R509 Fever, unspecified: Secondary | ICD-10-CM

## 2014-06-17 NOTE — Progress Notes (Signed)
Patient ID: Dawn [name], female   DOB: 1946-01-15, 68 y.o.   MRN: [id number]   this is an acute visit.  Level of care skilled.  Facility AF.   Chief Complaint      .   acute visit secondary to pneumonia    HPI: Patient is 68 y.o. female who Has MR, seizures and CHF who is admitted to SNF s/p urosepsis followed by FUO--- she was treated aggressively with antibiotics--it was thought the subsequent fever of unknown origin was caused by PEG tube infection apparently this improved and she was- recently admitted to this facility .  Apparently yesterday she developed a fever of over 101-she was assessed by Dr. Sheppard Coil lab work was ordered as well as a chest x-ray-chest x-ray came back last night for left lower lung pneumonia and she has been started on Avelox.  It appears her urine culture is pending blood work has come back significant for an elevated white count of 16.9 with granulocytes of 12.2 absolute.  According to nursing staff she is doing better today she has been running a low-grade fever currently 99.4-vital signs are stable she is mildly tachycardic with a pulse 105   Patient is essentially nonverbal and cannot give any review of systems however nursing staff feels she's doing better today  Past Medical History   Diagnosis  Date   .  Seizures    .  Osteoporosis    .  Thyroid disease      hypothyroid   .  High cholesterol    .  Cataracts, bilateral    .  Optic atrophy    .  Paraparesis      mild   .  Hypothyroidism    .  Anginal pain    .  Coronary artery disease     Past Surgical History   Procedure  Laterality  Date   .  Midline incision     .  Central venous catheter insertion   05/15/2014       .  Esophagogastroduodenoscopy  N/A  06/03/2014     Procedure: ESOPHAGOGASTRODUODENOSCOPY (EGD); Surgeon: Beryle Beams, MD; Location: [facility] ENDOSCOPY; Service: Endoscopy; Laterality: N/A;      Medication List                     acetaminophen 160 MG/5ML solution     Commonly known as: TYLENOL    Place 20.3 mLs (650 mg total) into feeding tube every 6 (six) hours as needed for mild pain, headache or fever.    albuterol (2.5 MG/3ML) 0.083% nebulizer solution    Commonly known as: PROVENTIL    Take 3 mLs (2.5 mg total) by nebulization every 6 (six) hours as needed for wheezing or shortness of breath.     Avelox 400 mg daily via PEG on day 2 of 10          aspirin EC 81 MG tablet    Take 1 tablet (81 mg total) by mouth daily.    atorvastatin 10 MG tablet    Commonly known as: LIPITOR    Take 10 mg by mouth daily.    docusate 50 MG/5ML liquid    Commonly known as: COLACE    Place 20 mLs (200 mg total) into feeding tube 2 (two) times daily.    feeding supplement (JEVITY 1.2 CAL) Liqd    Place 1,000 mLs into feeding tube daily. At 59ml/hr    free water Soln  Place 200 mLs into feeding tube every 6 (six) hours.    levETIRAcetam 100 MG/ML solution    Commonly known as: KEPPRA    Place 5 mLs (500 mg total) into feeding tube daily at 6 (six) AM.    levETIRAcetam 100 MG/ML solution    Commonly known as: KEPPRA    750mg  every day at 1400hrs and 2000hrs    levothyroxine 88 MCG tablet    Commonly known as: SYNTHROID, LEVOTHROID    Take 1 tablet (88 mcg total) by mouth daily.    OYSTER CALCIUM 500 + D 500-200 MG-UNIT per tablet    Generic drug: calcium-vitamin D    Take 1 tablet by mouth daily.    polyethylene glycol packet    Commonly known as: MIRALAX / GLYCOLAX    Take 17 g by mouth daily.    Valproic Acid 250 MG/5ML Syrp syrup    Commonly known as: DEPAKENE    Place 15 mLs (750 mg total) into feeding tube 3 (three) times daily.    vitamin C 500 MG tablet    Commonly known as: ASCORBIC ACID    Take 500 mg by mouth 3 (three) times daily.     No orders of the defined types were placed in this encounter.  There is no immunization history on file for this patient.  History   Substance Use Topics   .  Smoking status:  Never Smoker   .   Smokeless tobacco:  Never Used   .  Alcohol Use:  No   Family history is noncontributory  Review of Systems UTO - pt non verbal MR  Filed Vitals:                   Physical Exam emperature is 99.4 pulse 105 respirations 20 blood pressure 103/74----O2 sat 95% on room air  GENERAL APPEARANCE: Alert,non conversant  SKIN: No diaphoresis rash  HEAD: Normocephalic, atraumatic  EYES: Conjunctiva/lids clear. Pupils round, reactive. EOMs intact.  --O2 sat 95% on room air EARS: . Hearing grossly normal.  NOSE: No deformity or discharge.  MOUTH/THROAT: Lips w/o lesions.  RESPIRATORY: Breathing is even, unlabored. Lung sounds are clear however poor respiratory  but no labored breathing  no wheezing CARDIOVASCULAR: Heart RRR-slightly tachycardic  no murmurs, rubs or gallops. Pedal edema.  GASTROINTESTINAL: Abdomen is soft, non-tender, not distended w/ normal bowel sounds;PEG site appears unremarkable there is no drainage tenderness or erythema surrounding the site   GENITOURINARY: Bladder non tender, not distended  MUSCULOSKELETAL: legs contractured in flexion; BUE contractures  NEUROLOGIC: Pt's eyes were open but pt didn't respond to verbal or visual; pt moved L arm a little  PSYCHIATRIC: flat affect  Patient Active Problem List    Diagnosis  Date Noted   .  Sepsis  06/12/2014   .  Facial droop  06/12/2014   .  Hypernatremia  06/12/2014   .  PEG (percutaneous endoscopic gastrostomy) status  06/12/2014   .  FUO (fever of unknown origin)  06/02/2014   .  Paroxysmal a-fib  05/14/2014   .  Dysphasia  05/14/2014   .  UTI (lower urinary tract infection)  05/14/2014   .  Tooth decay  05/14/2014   .  SIRS (systemic inflammatory response syndrome)  12/15/2013   .  Thrombocytopenia  10/29/2013   .  Hypotension  10/29/2013   .  Anemia  10/29/2013   .  Diastolic heart failure, NYHA class 2  10/29/2013   .  Atrial fibrillation with rapid ventricular response  10/28/2013   .  Seizure disorder   10/28/2013   .  MR (mental retardation)  10/28/2013   .  Hypothyroidism  10/28/2013   .  HLD (hyperlipidemia)  10/28/2013   .  Non-ST elevation myocardial infarction (NSTEMI)  10/28/2013    06/16/2014.  WBC 16.9 hemoglobin hemoglobin 9.5 platelets 266.  Sodium 133 potassium 4.3 BUN 13 creatinine 0.65   Component  Value  Date/Time    WBC  9.7  06/08/2014 0715    RBC  3.47*  06/08/2014 0715    RBC  1.74*  05/15/2014 0740    HGB  10.3*  06/08/2014 0715    HCT  31.8*  06/08/2014 0715    PLT  164  06/08/2014 0715    MCV  91.6  06/08/2014 0715    LYMPHSABS  1.8  06/04/2014 0635    MONOABS  1.6*  06/04/2014 0635    EOSABS  0.0  06/04/2014 0635    BASOSABS  0.0  06/04/2014 0635   CMP    Component  Value  Date/Time    NA  147  06/09/2014 0557    K  4.0  06/09/2014 0557    CL  110  06/09/2014 0557    CO2  29  06/09/2014 0557    GLUCOSE  123*  06/09/2014 0557    BUN  6  06/09/2014 0557    CREATININE  0.51  06/09/2014 0557    CALCIUM  8.0*  06/09/2014 0557    PROT  6.1  05/31/2014 0520    ALBUMIN  1.2*  05/31/2014 0520    AST  13  05/31/2014 0520    ALT  9  05/31/2014 0520    ALKPHOS  200*  05/31/2014 0520    BILITOT  0.3  05/31/2014 0520    GFRNONAA  >90  06/09/2014 0557    GFRAA  >90  06/09/2014 0557   Assessment and Plan  Fever of unknown origin-suspect pneumonia-at this point will continue Avelox on day 2 of 10-she appears to be stable still is running a temperature at times and mildly tachycardic according to nursing staff and after speaking with Dr. Sheppard Coil via phone this appears to be  Stable- gradually improving although will have to be watched   Will await urine culture results-at this point Peg site appears unremarkable.  Will repeat CBC with differential and metabolic panel tomorrow for followup.  Also will monitor vital signs pulse ox every shift --also has when necessary nebulizers every 6 hours when necessary  .  This was discussed with Dr. Sheppard Coil via phone-  [phone]  .

## 2014-06-29 ENCOUNTER — Other Ambulatory Visit (HOSPITAL_COMMUNITY): Payer: Self-pay | Admitting: Internal Medicine

## 2014-06-29 DIAGNOSIS — R1310 Dysphagia, unspecified: Secondary | ICD-10-CM

## 2014-06-30 ENCOUNTER — Ambulatory Visit (HOSPITAL_COMMUNITY)
Admission: RE | Admit: 2014-06-30 | Discharge: 2014-06-30 | Disposition: A | Discharge status: Home / Self Care | Payer: PRIVATE HEALTH INSURANCE | Source: Ambulatory Visit | Attending: Internal Medicine | Admitting: Internal Medicine

## 2014-06-30 DIAGNOSIS — R1310 Dysphagia, unspecified: Secondary | ICD-10-CM | POA: Insufficient documentation

## 2014-06-30 NOTE — Procedures (Signed)
MBSS complete. Full report located under chart review in imaging section.  Arsal Tappan Paiewonsky, M.A. CCC-SLP [phone]  

## 2014-07-05 ENCOUNTER — Non-Acute Institutional Stay (SKILLED_NURSING_FACILITY): Payer: Medicare Other | Admitting: Internal Medicine

## 2014-07-05 DIAGNOSIS — D72829 Elevated white blood cell count, unspecified: Secondary | ICD-10-CM

## 2014-07-05 DIAGNOSIS — R000 Tachycardia, unspecified: Secondary | ICD-10-CM

## 2014-07-11 ENCOUNTER — Encounter: Payer: Self-pay | Admitting: Internal Medicine

## 2014-07-11 NOTE — Progress Notes (Signed)
Patient ID: Dawn [name], female   DOB: 1946-03-08, 68 y.o.   MRN: [id number]   this is an acute visit.  Level of care skilled.  Facility AF.  Chief Complaint     .  acute visit followup leukocytosis   HPI: Patient is 68 y.o. female who Has MR, seizures and CHF who is admitted to SNF s/p urosepsis followed by FUO--- she was treated aggressively with antibiotics--it was thought the subsequent fever of unknown origin was caused by PEG tube infection apparently this improved and she was- recently admitted to this facility  .  Recently she developed a fever of over 101-she was assessed by Dr. Sheppard Coil lab work was ordered as well as a chest x-ray-chest x-ray came back positive for left lower lung pneumonia and she has completed Avelox.  Also lab work came back significant for an elevated white count of 16.9 --this has come down to 12.4 on lab done subsequently.  According to nursing staff she is doing better--they don't report any acute issues over recent temperature-I do note that she does tend to run mildly tachycardic at times appears runs from 80s to low 100s- she does have a diagnosis of atrial fibrillation she is on aspirin for anticoagulation apparently deemed not a good anticoagulation candidate with aggressive therapy i.e. Coumadin etc. t   Patient is essentially nonverbal and cannot give any review of systems however nursing staff feels she's doing better today  Past Medical History   Diagnosis  Date   .  Seizures    .  Osteoporosis    .  Thyroid disease      hypothyroid   .  High cholesterol    .  Cataracts, bilateral    .  Optic atrophy    .  Paraparesis      mild   .  Hypothyroidism    .  Anginal pain    .  Coronary artery disease     Past Surgical History   Procedure  Laterality  Date   .  Midline incision     .  Central venous catheter insertion   05/15/2014       .  Esophagogastroduodenoscopy  N/A  06/03/2014     Procedure: ESOPHAGOGASTRODUODENOSCOPY (EGD); Surgeon:  Beryle Beams, MD; Location: [facility] ENDOSCOPY; Service: Endoscopy; Laterality: N/A;      Medication List                    acetaminophen 160 MG/5ML solution    Commonly known as: TYLENOL    Place 20.3 mLs (650 mg total) into feeding tube every 6 (six) hours as needed for mild pain, headache or fever.    albuterol (2.5 MG/3ML) 0.083% nebulizer solution    Commonly known as: PROVENTIL    Take 3 mLs (2.5 mg total) by nebulization every 6 (six) hours as needed for wheezing or shortness of breath.    Avelox 400 mg daily via PEG on day 2 of 10          aspirin EC 81 MG tablet    Take 1 tablet (81 mg total) by mouth daily.    atorvastatin 10 MG tablet    Commonly known as: LIPITOR    Take 10 mg by mouth daily.    docusate 50 MG/5ML liquid    Commonly known as: COLACE    Place 20 mLs (200 mg total) into feeding tube 2 (two) times daily.    feeding supplement (JEVITY 1.2 CAL)  Liqd    Place 1,000 mLs into feeding tube daily. At 30ml/hr    free water Soln    Place 200 mLs into feeding tube every 6 (six) hours.    levETIRAcetam 100 MG/ML solution    Commonly known as: KEPPRA    Place 5 mLs (500 mg total) into feeding tube daily at 6 (six) AM.    levETIRAcetam 100 MG/ML solution    Commonly known as: KEPPRA    750mg  every day at 1400hrs and 2000hrs    levothyroxine 88 MCG tablet    Commonly known as: SYNTHROID, LEVOTHROID    Take 1 tablet (88 mcg total) by mouth daily.    OYSTER CALCIUM 500 + D 500-200 MG-UNIT per tablet    Generic drug: calcium-vitamin D    Take 1 tablet by mouth daily.    polyethylene glycol packet    Commonly known as: MIRALAX / GLYCOLAX    Take 17 g by mouth daily.    Valproic Acid 250 MG/5ML Syrp syrup    Commonly known as: DEPAKENE    Place 15 mLs (750 mg total) into feeding tube 3 (three) times daily.    vitamin C 500 MG tablet    Commonly known as: ASCORBIC ACID    Take 500 mg by mouth 3 (three) times daily.     No orders of the defined types were placed  in this encounter.  There is no immunization history on file for this patient.  History   Substance Use Topics   .  Smoking status:  Never Smoker   .  Smokeless tobacco:  Never Used   .  Alcohol Use:  No   Family history is noncontributory  Review of Systems UTO - pt non verbal MR  Filed Vitals:                  Physical Exam e temperature 98.1 pulse 105 respirations 18 blood pressure 129/74--  GENERAL APPEARANCE: Alert,non conversant  SKIN: No diaphoresis rash  HEAD: Normocephalic, atraumatic  EYES: Conjunctiva/lids clear. Pupils round, reactive. EOMs intact.   EARS: . Hearing grossly normal.  NOSE: No deformity or discharge.  MOUTH/THROAT: Lips w/o lesions.  RESPIRATORY: Breathing is even, unlabored. Lung sounds are clear however poor respiratory but no labored breathing no wheezing  CARDIOVASCULAR: Heart RRR-slightly tachycardic no murmurs, rubs or gallops. Pedal edema.  GASTROINTESTINAL: Abdomen is soft, non-tender, not distended w/ normal bowel sounds;PEG site appears unremarkable there is minimal  baseline drainage  no tenderness or erythema surrounding the site --no sign of infection GENITOURINARY: Bladder non tender, not distended  MUSCULOSKELETAL: legs contractured in flexion; BUE contractures  NEUROLOGIC: Pt's eyes were open but pt didn't respond to verbal or visual;  PSYCHIATRIC: flat affect  Patient Active Problem List    Diagnosis  Date Noted   .  Sepsis  06/12/2014   .  Facial droop  06/12/2014   .  Hypernatremia  06/12/2014   .  PEG (percutaneous endoscopic gastrostomy) status  06/12/2014   .  FUO (fever of unknown origin)  06/02/2014   .  Paroxysmal a-fib  05/14/2014   .  Dysphasia  05/14/2014   .  UTI (lower urinary tract infection)  05/14/2014   .  Tooth decay  05/14/2014   .  SIRS (systemic inflammatory response syndrome)  12/15/2013   .  Thrombocytopenia  10/29/2013   .  Hypotension  10/29/2013   .  Anemia  10/29/2013   .  Diastolic heart failure,  NYHA class 2  10/29/2013   .  Atrial fibrillation with rapid ventricular response  10/28/2013   .  Seizure disorder  10/28/2013   .  MR (mental retardation)  10/28/2013   .  Hypothyroidism  10/28/2013   .  HLD (hyperlipidemia)  10/28/2013   .  Non-ST elevation myocardial infarction (NSTEMI)  10/28/2013   06/21/2014.  WBC 12.4 hemoglobin 8.5 platelets 320.  Sodium 134 potassium 4.3 BUN 14 creatinine 0.57.  06/16/2014  WBC 16.9 hemoglobin hemoglobin 9.5 platelets 266.  Sodium 133 potassium 4.3 BUN 13 creatinine 0.65    Component  Value  Date/Time    WBC  9.7  06/08/2014 0715    RBC  3.47*  06/08/2014 0715    RBC  1.74*  05/15/2014 0740    HGB  10.3*  06/08/2014 0715    HCT  31.8*  06/08/2014 0715    PLT  164  06/08/2014 0715    MCV  91.6  06/08/2014 0715    LYMPHSABS  1.8  06/04/2014 0635    MONOABS  1.6*  06/04/2014 0635    EOSABS  0.0  06/04/2014 0635    BASOSABS  0.0  06/04/2014 0635   CMP    Component  Value  Date/Time    NA  147  06/09/2014 0557    K  4.0  06/09/2014 0557    CL  110  06/09/2014 0557    CO2  29  06/09/2014 0557    GLUCOSE  123*  06/09/2014 0557    BUN  6  06/09/2014 0557    CREATININE  0.51  06/09/2014 0557    CALCIUM  8.0*  06/09/2014 0557    PROT  6.1  05/31/2014 0520    ALBUMIN  1.2*  05/31/2014 0520    AST  13  05/31/2014 0520    ALT  9  05/31/2014 0520    ALKPHOS  200*  05/31/2014 0520    BILITOT  0.3  05/31/2014 0520    GFRNONAA  >90  06/09/2014 0557    GFRAA  >90  06/09/2014 0557   Assessment and Plan  #1-leukocytosis-this appears to be trending down status post treatment for pneumonia-we'll update this-especially in light of mild tachycardia at times.  #2-tachycardia this appears to be somewhat intermittent one would think possibly h anxiety or agitation related at times-I noter blood pressures are variable I do see some systolics in the 75I which would be concerning for starting additional medication-- At this point will order an EKG-as well as a TSH and metabolic  panel in addition to the CBC-continue to monitor clinically-she appears to be stable   [id number]  Of note greater than 25 minutes spentassessing patient-discussion with nursing staff-and coordinating and formulating a plan of care-of note greater than 50% of time spent coordinating plan of care

## 2014-07-16 ENCOUNTER — Non-Acute Institutional Stay (SKILLED_NURSING_FACILITY): Payer: Medicare Other | Admitting: Internal Medicine

## 2014-07-16 ENCOUNTER — Encounter: Payer: Self-pay | Admitting: Internal Medicine

## 2014-07-16 DIAGNOSIS — R000 Tachycardia, unspecified: Secondary | ICD-10-CM

## 2014-07-16 DIAGNOSIS — I4891 Unspecified atrial fibrillation: Secondary | ICD-10-CM

## 2014-07-16 DIAGNOSIS — D638 Anemia in other chronic diseases classified elsewhere: Secondary | ICD-10-CM

## 2014-07-16 DIAGNOSIS — E031 Congenital hypothyroidism without goiter: Secondary | ICD-10-CM

## 2014-07-16 NOTE — Progress Notes (Signed)
Patient ID: Dawn [name], female   DOB: [birth year], 68 y.o.   MRN: [id number]   this is a routine visit.  Level of care skilled.  Facility AF.   Chief Complaint   Medical management of seizure disorder-CHF-mental retardation-atrial fibrillation-dysphasia with PEG tube   .    HPI: Patient is 68 y.o. female who Has MR, seizures and CHF who was admitted to skilled nursing status post hospitalization for urosepsis--- several weeks ago she did complete antibiotics for suspected pneumonia with leukocytosis-the leukocytosis appears to be trending down   Clinically she appears to be stable-he does have some borderline tachycardia-EKG did show sinus tachycardia she does have a history of atrial fibrillation thought not to be an aggressive anticoagulation candidate.  She is on aspirin.  Vital signs continued to be stable she is afebrile recent blood pressures 120/80 taken manually [id number] times  a systolic in the 20B and low 100s but per review today this does not appear to be all that,common  She does have a history of seizure disorder on Keppra there've been no recent seizures to my knowledge.  Also has a history of chronic anemia-recent hemoglobin 8.6-apparently at one point she received a transfusion in the hospital-we'll update this-.  Clinically she appears to be stable--nursing staff does not note any concerns.  Past Medical History   Diagnosis  Date   .  Seizures    .  Osteoporosis    .  Thyroid disease      hypothyroid   .  High cholesterol    .  Cataracts, bilateral    .  Optic atrophy    .  Paraparesis      mild   .  Hypothyroidism    .  Anginal pain    .  Coronary artery disease     Past Surgical History   Procedure  Laterality  Date   .  Midline incision     .  Central venous catheter insertion   05/15/2014       .  Esophagogastroduodenoscopy  N/A  06/03/2014     Procedure: ESOPHAGOGASTRODUODENOSCOPY (EGD); Surgeon: Beryle Beams, MD; Location: [facility]  ENDOSCOPY; Service: Endoscopy; Laterality: N/A;      Medication List         .            acetaminophen 160 MG/5ML solution    Commonly known as: TYLENOL    Place 20.3 mLs (650 mg total) into feeding tube every 6 (six) hours as needed for mild pain, headache or fever.    albuterol (2.5 MG/3ML) 0.083% nebulizer solution    Commonly known as: PROVENTIL    Take 3 mLs (2.5 mg total) by nebulization every 6 (six) hours as needed for wheezing or shortness of breath.              aspirin EC 81 MG tablet    Take 1 tablet (81 mg total) by mouth daily.    atorvastatin 10 MG tablet    Commonly known as: LIPITOR    Take 10 mg by mouth daily.    docusate 50 MG/5ML liquid    Commonly known as: COLACE    Place 20 mLs (200 mg total) into feeding tube 2 (two) times daily.    feeding supplement (JEVITY 1.2 CAL) Liqd    Place 1,000 mLs into feeding tube daily. At 53ml/hr    free water Soln    Place 200 mLs into feeding tube every 6 (six) hours.  levETIRAcetam 100 MG/ML solution    Commonly known as: KEPPRA    Place 5 mLs (500 mg total) into feeding tube daily at 6 (six) AM.    levETIRAcetam 100 MG/ML solution    Commonly known as: KEPPRA    750mg  every day at 1400hrs and 2000hrs    levothyroxine 88 MCG tablet    Commonly known as: SYNTHROID, LEVOTHROID    Take 1 tablet (88 mcg total) by mouth daily.    OYSTER CALCIUM 500 + D 500-200 MG-UNIT per tablet    Generic drug: calcium-vitamin D    Take 1 tablet by mouth daily.    polyethylene glycol packet    Commonly known as: MIRALAX / GLYCOLAX    Take 17 g by mouth daily.    Valproic Acid 250 MG/5ML Syrp syrup    Commonly known as: DEPAKENE    Place 15 mLs (750 mg total) into feeding tube 3 (three) times daily.    vitamin C 500 MG tablet    Commonly known as: ASCORBIC ACID    Take 500 mg by mouth 3 (three) times daily.     No orders of the defined types were placed in this encounter.  There is no immunization history on file for this  patient.  History   Substance Use Topics   .  Smoking status:  Never Smoker   .  Smokeless tobacco:  Never Used   .  Alcohol Use:  No   Family history is noncontributory  Review of Systems UTO - pt non verbal MR--please see history of present illness                    Physical Exam  Temperature 98.0 pulse 103 respirations 18 blood pressure 120/80  GENERAL APPEARANCE: Alert,non conversant sitting comfortably in wheelchair SKIN: No diaphoresis rash  HEAD: Normocephalic, atraumatic  EYES: Conjunctiva/lids clear. Pupils round, reactive. EOMs intact.  EARS: Ex. Hearing grossly normal.  NOSE: No deformity or discharge.  MOUTH/THROAT: Lips w/o lesions.  RESPIRATORY: Breathing is even, unlabored. Lung sounds are clear--poor effort patient does not really respond to verbal commands  CARDIOVASCULAR: Heart RRR--slightly tachycardic-- no murmurs, rubs or gallops.slight Pedal edema.  GASTROINTESTINAL: Abdomen is soft, non-tender, not distended w/ normal bowel sounds;PEG appears unremarkable without surrounding erythema firmness or tenderness minimal baseline drainage  MUSCULOSKELETAL: legs contractured in flexion; BUE contractures  NEUROLOGIC: There is at baseline does have some movement of her upper extremities continues with baseline facial droop-has contractures of her lower extremities  PSYCHIATRIC: flat affect--a history of mental retardation Patient Active Problem List    Diagnosis  Date Noted   .  Sepsis  06/12/2014   .  Facial droop  06/12/2014   .  Hypernatremia  06/12/2014   .  PEG (percutaneous endoscopic gastrostomy) status  06/12/2014   .  FUO (fever of unknown origin)  06/02/2014   .  Paroxysmal a-fib  05/14/2014   .  Dysphasia  05/14/2014   .  UTI (lower urinary tract infection)  05/14/2014   .  Tooth decay  05/14/2014   .  SIRS (systemic inflammatory response syndrome)  12/15/2013   .  Thrombocytopenia  10/29/2013   .  Hypotension  10/29/2013   .  Anemia  10/29/2013     .  Diastolic heart failure, NYHA class 2  10/29/2013   .  Atrial fibrillation with rapid ventricular response  10/28/2013   .  Seizure disorder  10/28/2013   .  MR (  mental retardation)  10/28/2013   .  Hypothyroidism  10/28/2013   .  HLD (hyperlipidemia)  10/28/2013   .  Non-ST elevation myocardial infarction (NSTEMI)  10/28/2013     Labs.  06/25/2014.  Sodium 137 potassium 4.5 BUN 15 creatinine 0.5.  WBC 12.3 hemoglobin 8.6 platelets 289-   Component  Value  Date/Time    WBC  9.7  06/08/2014 0715    RBC  3.47*  06/08/2014 0715    RBC  1.74*  05/15/2014 0740    HGB  10.3*  06/08/2014 0715    HCT  31.8*  06/08/2014 0715    PLT  164  06/08/2014 0715    MCV  91.6  06/08/2014 0715    LYMPHSABS  1.8  06/04/2014 0635    MONOABS  1.6*  06/04/2014 0635    EOSABS  0.0  06/04/2014 0635    BASOSABS  0.0  06/04/2014 0635   CMP    Component  Value  Date/Time    NA  147  06/09/2014 0557    K  4.0  06/09/2014 0557    CL  110  06/09/2014 0557    CO2  29  06/09/2014 0557    GLUCOSE  123*  06/09/2014 0557    BUN  6  06/09/2014 0557    CREATININE  0.51  06/09/2014 0557    CALCIUM  8.0*  06/09/2014 0557    PROT  6.1  05/31/2014 0520    ALBUMIN  1.2*  05/31/2014 0520    AST  13  05/31/2014 0520    ALT  9  05/31/2014 0520    ALKPHOS  200*  05/31/2014 0520    BILITOT  0.3  05/31/2014 0520    GFRNONAA  >90  06/09/2014 0557    GFRAA  >90  06/09/2014 0557   Assessment and Plan  Sepsis  Due to pansensitive E. Coli;tx--clinically this appears resolved she is afebrile  FUO (fever of unknown origin)   This appeared to have been a PEG infection etiology-again white count has trended down she is afebrile appears to be doing significantly better--we'll update CBC with differential  Facial droop  Non contrasted CT Head normal 8/4. Suspect this is her baseline facies  Dysphasia  She has chronic dysphagia. . Tolerating PEG feeds   Seizure disorder  Continue Keppra and Depacon. Will update Depakote level Hypernatremia    Apparently this is stabilized Will update metabolic panel sodium on 41/32/4401 was 141  Anemia  On 8/1 transfused 3 units PRBC. --Hemoglobin trended down but appears to have stabilized per recent lab at 8.6-we'll update this--previous hemoglobin 0.2-7.2 Diastolic heart failure, NYHA class 2  At this point appears to be  [facility] somewhat challenging balancing situation  between her sodium status and control of this with Lasix   Hypothyroidism  Increased Synthroid to 88 mcg daily. Followup TSH  Paroxysmal a-fib  Appears to be slightly tachycardic; ASA for prophylAXIS;not candidate for anticoagulation aggressively.  Will start low-dose Lopressor for rate control 6.25 mg twice a day and monitor-hold for systolic blood pressure less than 105 or pulse less than 60  MR (mental retardation)  Patient has poor overall long term prognosis--but this appears at this time to be at baseline she is responsive but aphasic   PEG (percutaneous endoscopic gastrostomy) status --appears to be tolerating her PEG feedings well-at this point monitor .    [id number]

## 2014-07-19 LAB — TSH: TSH: 5.5 u[IU]/mL (ref <=5.90)

## 2014-07-26 ENCOUNTER — Non-Acute Institutional Stay (SKILLED_NURSING_FACILITY): Payer: Medicare Other | Admitting: Internal Medicine

## 2014-07-26 ENCOUNTER — Encounter: Payer: Self-pay | Admitting: Internal Medicine

## 2014-07-26 DIAGNOSIS — D72829 Elevated white blood cell count, unspecified: Secondary | ICD-10-CM | POA: Insufficient documentation

## 2014-07-26 DIAGNOSIS — R509 Fever, unspecified: Secondary | ICD-10-CM

## 2014-07-26 DIAGNOSIS — E038 Other specified hypothyroidism: Secondary | ICD-10-CM

## 2014-07-26 DIAGNOSIS — R000 Tachycardia, unspecified: Secondary | ICD-10-CM

## 2014-07-26 DIAGNOSIS — D638 Anemia in other chronic diseases classified elsewhere: Secondary | ICD-10-CM

## 2014-07-26 NOTE — Progress Notes (Signed)
Patient ID: Dawn [name], female   DOB: [birth year], 68 y.o.   MRN: [id number]   this is an acute  visit.  Level of care skilled.  Facility AF .  Chief Complaint    Acute visit secondary to low grade temperature-followup tachycardia-followup hypothyroidism-leukocytosis   .    HPI: Patient is 68 y.o. female who Has MR, seizures and CHF who was admitted to skilled nursing status post hospitalization for urosepsis---  she did complete antibiotics for suspected pneumonia with leukocytosis-the leukocytosis appears to be trending down--however still appears mildly elevated a bit over 12  Clinically she appears to be stable--she was noted over the weekend have a low-grade temperature of 99.6- today she is afebrile  -she does have some borderline tachycardia-EKG did show sinus tachycardia she does have a history of atrial fibrillation thought not to be an aggressive anticoagulation candidate.--We did start her on Lopressor low dose 6.25 mg couple weeks ago pulses appear to be somewhat improved although I still see frequent pulse is in the low 100s-on exam today was 96  She is on aspirin .  Vital signs continued to be stable recent blood pressures 110/68-127/85-105/69   She does have a history of seizure disorder on Keppra there've been no recent seizures to my knowledge.  Also has a history of chronic anemia-recent hemoglobin 8.2-apparently at one point she received a transfusion in the hospital-her baseline appears to be in the low mid eights-to mid nines  Clinically she appears to be stable--nursing staff does not note any concerns--other than the low grade temperature over the weekend  I do note her TSH is mildly elevated at 5.5 she's currently on Synthroid 88 mcg a day to her she's been on this for approximately 6 weeks-.  Past Medical History   Diagnosis  Date   .  Seizures    .  Osteoporosis    .  Thyroid disease      hypothyroid   .  High cholesterol    .  Cataracts, bilateral    .   Optic atrophy    .  Paraparesis      mild   .  Hypothyroidism    .  Anginal pain    .  Coronary artery disease     Past Surgical History   Procedure  Laterality  Date   .  Midline incision     .  Central venous catheter insertion   05/15/2014       .  Esophagogastroduodenoscopy  N/A  06/03/2014     Procedure: ESOPHAGOGASTRODUODENOSCOPY (EGD); Surgeon: Beryle Beams, MD; Location: [facility] ENDOSCOPY; Service: Endoscopy; Laterality: N/A;      Medication List         .            acetaminophen 160 MG/5ML solution    Commonly known as: TYLENOL    Place 20.3 mLs (650 mg total) into feeding tube every 6 (six) hours as needed for mild pain, headache or fever.    albuterol (2.5 MG/3ML) 0.083% nebulizer solution    Commonly known as: PROVENTIL    Take 3 mLs (2.5 mg total) by nebulization every 6 (six) hours as needed for wheezing or shortness of breath.             aspirin EC 81 MG tablet    Take 1 tablet (81 mg total) by mouth daily.    atorvastatin 10 MG tablet    Commonly known as: LIPITOR    Take  10 mg by mouth daily.    docusate 50 MG/5ML liquid    Commonly known as: COLACE    Place 20 mLs (200 mg total) into feeding tube 2 (two) times daily.    feeding supplement (JEVITY 1.2 CAL) Liqd    Place 1,000 mLs into feeding tube daily. At 68ml/hr    free water Soln    Place 200 mLs into feeding tube every 6 (six) hours.    levETIRAcetam 100 MG/ML solution    Commonly known as: KEPPRA    Place 5 mLs (500 mg total) into feeding tube daily at 6 (six) AM.    levETIRAcetam 100 MG/ML solution    Commonly known as: KEPPRA    750mg  every day at 1400hrs and 2000hrs    levothyroxine 88 MCG tablet    Commonly known as: SYNTHROID, LEVOTHROID    Take 1 tablet (88 mcg total) by mouth daily.    OYSTER CALCIUM 500 + D 500-200 MG-UNIT per tablet    Generic drug: calcium-vitamin D    Take 1 tablet by mouth daily.    polyethylene glycol packet    Commonly known as: MIRALAX / GLYCOLAX    Take 17 g  by mouth daily.    Valproic Acid 250 MG/5ML Syrp syrup    Commonly known as: DEPAKENE    Place 15 mLs (750 mg total) into feeding tube 3 (three) times daily.    vitamin C 500 MG tablet    Commonly known as: ASCORBIC ACID    Take 500 mg by mouth 3 (three) times daily.     No orders of the defined types were placed in this encounter.  There is no immunization history on file for this patient  Lopressor-6.25 mg twice a day   .  History   Substance Use Topics   .  Smoking status:  Never Smoker   .  Smokeless tobacco:  Never Used   .  Alcohol Use:  No   Family history is noncontributory  Review of Systems UTO - pt non verbal MR--please see history of present illness                   Physical Exam Temperature 97.9 axillary-pulse 96 respirations 18 blood pressure 112/58 O2 saturation is 97% Currently she is afebrile with pulse of 96-respirations 18 blood pressure  GENERAL APPEARANCE: Alert,non conversant sitting comfortably in wheelchair  SKIN: No diaphoresis rash --she does have some chronic foot wounds that is followed by the wound care physician as well as wound care nursing facility-per nursing these are stable These appear to be dried eschar on the lateral lower right foot as well as an area on the left foot on the lateral and medial aspects--this appears to be dry with no drainage I do not really see any surrounding erythema-or significant tenderness-again this is followed by wound care HEAD: Normocephalic, atraumatic  EYES: Conjunctiva/lids clear. Pupils round, reactive. EOMs intact.  EARS: Ex. Hearing grossly normal.  NOSE: No deformity or discharge.  MOUTH/THROAT: Lips w/o lesions.  RESPIRATORY: Breathing is even, unlabored. Lung sounds are clear--poor effort patient does not really respond to verbal commands  CARDIOVASCULAR: Heart RRR--s-- no murmurs, rubs or gallops.slight Pedal edema.  GASTROINTESTINAL: Abdomen is soft, non-tender, not distended w/ normal bowel  sounds;PEG appears unremarkable without surrounding erythema firmness or tenderness minimal baseline drainage  MUSCULOSKELETAL: legs contractured in flexion; BUE contractures  NEUROLOGIC: There is at baseline does have some movement of her upper extremities continues with  baseline facial droop-has contractures of her lower extremities  PSYCHIATRIC: flat affect--a history of mental retardation  Patient Active Problem List    Diagnosis  Date Noted   .  Sepsis  06/12/2014   .  Facial droop  06/12/2014   .  Hypernatremia  06/12/2014   .  PEG (percutaneous endoscopic gastrostomy) status  06/12/2014   .  FUO (fever of unknown origin)  06/02/2014   .  Paroxysmal a-fib  05/14/2014   .  Dysphasia  05/14/2014   .  UTI (lower urinary tract infection)  05/14/2014   .  Tooth decay  05/14/2014   .  SIRS (systemic inflammatory response syndrome)  12/15/2013   .  Thrombocytopenia  10/29/2013   .  Hypotension  10/29/2013   .  Anemia  10/29/2013   .  Diastolic heart failure, NYHA class 2  10/29/2013   .  Atrial fibrillation with rapid ventricular response  10/28/2013   .  Seizure disorder  10/28/2013   .  MR (mental retardation)  10/28/2013   .  Hypothyroidism  10/28/2013   .  HLD (hyperlipidemia)  10/28/2013   .  Non-ST elevation myocardial infarction (NSTEMI)  10/28/2013   Labs.   07/20/2014.  WBC 12.4 this appears to be baseline lab a month earlier.   hemoglobin 8.2 platelets 478-differential shows mildly elevated neutrophil absolute at 6.9 had been 7.7 a month ago.  Sodium 136 potassium 4.4 BUN 17 creatinine 0.4.  Liver function tests unremarkable except for an albumin of 2.3.  TSH-5.50  .  06/25/2014.  Sodium 137 potassium 4.5 BUN 15 creatinine 0.5.  WBC 12.3 hemoglobin 8.6 platelets 289-    Component  Value  Date/Time    WBC  9.7  06/08/2014 0715    RBC  3.47*  06/08/2014 0715    RBC  1.74*  05/15/2014 0740    HGB  10.3*  06/08/2014 0715    HCT  31.8*  06/08/2014 0715    PLT  164   06/08/2014 0715    MCV  91.6  06/08/2014 0715    LYMPHSABS  1.8  06/04/2014 0635    MONOABS  1.6*  06/04/2014 0635    EOSABS  0.0  06/04/2014 0635    BASOSABS  0.0  06/04/2014 0635   CMP    Component  Value  Date/Time    NA  147  06/09/2014 0557    K  4.0  06/09/2014 0557    CL  110  06/09/2014 0557    CO2  29  06/09/2014 0557    GLUCOSE  123*  06/09/2014 0557    BUN  6  06/09/2014 0557    CREATININE  0.51  06/09/2014 0557    CALCIUM  8.0*  06/09/2014 0557    PROT  6.1  05/31/2014 0520    ALBUMIN  1.2*  05/31/2014 0520    AST  13  05/31/2014 0520    ALT  9  05/31/2014 0520    ALKPHOS  200*  05/31/2014 0520    BILITOT  0.3  05/31/2014 0520    GFRNONAA  >90  06/09/2014 0557    GFRAA  >90  06/09/2014 0557   Assessment and Plan  Sepsis  Due to pansensitive E. Coli;tx--clinically this appears resolved --but her white count remains mildly elevated we'll update a urine culture-as well as a CBC with differential  FUO (fever of unknown origin)  This appeared to have been a PEG infection etiology-again white count has  trended down she is afebrile appears to be doing significantly better--we'll update CBC with differential--this point PEG site looks quite benign  Facial droop  Non contrasted CT Head normal 8/4. Suspect this is her baseline facies  Dysphasia  She has chronic dysphagia. . Tolerating PEG feeds  Seizure disorder  Continue Keppra and Depacon.   Hypernatremia  Apparently this is stabilized  Anemia  On 8/1 transfused 3 units PRBC. --Hemoglobin trended down but appears to have stabilized per recent lab -remains in the 8's--we'll update this--previous hemoglobin 5.0-2.7  Diastolic heart failure, NYHA class 2  At this point appears to be [facility] somewhat challenging balancing situation between her sodium status and control of this with Lasix    Low-grade temperature-again will update a CBC with differential-as well as a UA CNS-also will check a chest x-ray Hypothyroidism  TSH is mildly  elevated we'll increase Synthroid to 100 mcg a day and recheck TSH in 4 weeks Paroxysmal a-fib continues to be to be slightly tachycardicat times--; ASA for prophylAXIS;not candidate for anticoagulation aggressively.  Will increase  low-dose Lopressor for rate control 12.5 mg twice a day and monitor-hold for systolic blood pressure less than 105 or pulse less than 60  MR (mental retardation)  Patient has poor overall long term prognosis--but this appears at this time to be at baseline she is responsive but aphasic  PEG (percutaneous endoscopic gastrostomy) status --appears to be tolerating her PEG feedings well-at this point monitor   [id number]-of note greater than 35 minutes spent assessing patient-discussing patient's status with nursing staff-reviewing her chart-and coordinating and formulating a plan of care for numerous diagnoses .

## 2014-07-27 LAB — CBC AND DIFFERENTIAL
HCT: 32 % — AB (ref 36–46)
Hemoglobin: 9.3 g/dL — AB (ref 12.0–16.0)
Platelets: 298 10*3/uL (ref 150–399)
WBC: 12.2 10*3/mL

## 2014-07-27 LAB — BASIC METABOLIC PANEL
BUN: 16 mg/dL (ref 4–21)
CREATININE: 0.4 mg/dL — AB (ref 0.5–1.1)
Glucose: 112 mg/dL
POTASSIUM: 4.4 mmol/L (ref 3.4–5.3)
Sodium: 136 mmol/L — AB (ref 137–147)

## 2014-07-27 LAB — LIPID PANEL
CHOLESTEROL: 117 mg/dL (ref 0–200)
HDL: 22 mg/dL — AB (ref 35–70)
LDL Cholesterol: 72 mg/dL
Triglycerides: 116 mg/dL (ref 40–160)

## 2014-08-03 ENCOUNTER — Emergency Department (HOSPITAL_COMMUNITY): Payer: Medicare Other

## 2014-08-03 ENCOUNTER — Emergency Department (HOSPITAL_COMMUNITY)
Admission: EM | Admit: 2014-08-03 | Discharge: 2014-08-03 | Disposition: A | Discharge status: Home / Self Care | Payer: Medicare Other | Attending: Emergency Medicine | Admitting: Emergency Medicine

## 2014-08-03 ENCOUNTER — Encounter: Payer: Self-pay | Admitting: Internal Medicine

## 2014-08-03 ENCOUNTER — Encounter (HOSPITAL_COMMUNITY): Payer: Self-pay | Admitting: Emergency Medicine

## 2014-08-03 ENCOUNTER — Non-Acute Institutional Stay (SKILLED_NURSING_FACILITY): Payer: Medicare Other | Admitting: Internal Medicine

## 2014-08-03 DIAGNOSIS — I25119 Atherosclerotic heart disease of native coronary artery with unspecified angina pectoris: Secondary | ICD-10-CM | POA: Diagnosis not present

## 2014-08-03 DIAGNOSIS — M810 Age-related osteoporosis without current pathological fracture: Secondary | ICD-10-CM | POA: Diagnosis not present

## 2014-08-03 DIAGNOSIS — G40909 Epilepsy, unspecified, not intractable, without status epilepticus: Secondary | ICD-10-CM | POA: Diagnosis not present

## 2014-08-03 DIAGNOSIS — H540 Blindness, both eyes: Secondary | ICD-10-CM | POA: Insufficient documentation

## 2014-08-03 DIAGNOSIS — E780 Pure hypercholesterolemia: Secondary | ICD-10-CM | POA: Insufficient documentation

## 2014-08-03 DIAGNOSIS — W1789XA Other fall from one level to another, initial encounter: Secondary | ICD-10-CM | POA: Insufficient documentation

## 2014-08-03 DIAGNOSIS — Z931 Gastrostomy status: Secondary | ICD-10-CM | POA: Insufficient documentation

## 2014-08-03 DIAGNOSIS — Z7982 Long term (current) use of aspirin: Secondary | ICD-10-CM | POA: Diagnosis not present

## 2014-08-03 DIAGNOSIS — S0003XA Contusion of scalp, initial encounter: Secondary | ICD-10-CM | POA: Diagnosis not present

## 2014-08-03 DIAGNOSIS — Z79899 Other long term (current) drug therapy: Secondary | ICD-10-CM | POA: Diagnosis not present

## 2014-08-03 DIAGNOSIS — Y92129 Unspecified place in nursing home as the place of occurrence of the external cause: Secondary | ICD-10-CM

## 2014-08-03 DIAGNOSIS — Y9389 Activity, other specified: Secondary | ICD-10-CM | POA: Insufficient documentation

## 2014-08-03 DIAGNOSIS — K5909 Other constipation: Secondary | ICD-10-CM | POA: Diagnosis not present

## 2014-08-03 DIAGNOSIS — Y92128 Other place in nursing home as the place of occurrence of the external cause: Secondary | ICD-10-CM | POA: Diagnosis not present

## 2014-08-03 DIAGNOSIS — Z7401 Bed confinement status: Secondary | ICD-10-CM | POA: Diagnosis not present

## 2014-08-03 DIAGNOSIS — E039 Hypothyroidism, unspecified: Secondary | ICD-10-CM | POA: Insufficient documentation

## 2014-08-03 DIAGNOSIS — T148XXA Other injury of unspecified body region, initial encounter: Secondary | ICD-10-CM

## 2014-08-03 DIAGNOSIS — W19XXXA Unspecified fall, initial encounter: Secondary | ICD-10-CM

## 2014-08-03 DIAGNOSIS — T148 Other injury of unspecified body region: Secondary | ICD-10-CM

## 2014-08-03 DIAGNOSIS — S0990XA Unspecified injury of head, initial encounter: Secondary | ICD-10-CM | POA: Diagnosis present

## 2014-08-03 MED ORDER — POLYETHYLENE GLYCOL 3350 17 G PO PACK: 17.0000 g | PACK | Freq: Every day | ORAL | Status: DC

## 2014-08-03 NOTE — ED Provider Notes (Signed)
CSN: [id number]     Arrival date & time 08/03/14  1554 History   First MD Initiated Contact with Patient 08/03/14 1613     Chief Complaint  Patient presents with  . Fall    head injury     (Consider location/radiation/quality/duration/timing/severity/associated sxs/prior Treatment) The history is provided by the EMS personnel.  Dawn [name] is a 68 y.o. female hx of seizures, high cholesterol, blind, bed bound here presenting with fall. She is bed bound. She was moved with a hoyer lift and was accidentally dropped about 3-4 feet. Patient was noted to have an occipital hematoma. Patient is not on Plavix or Coumadin. Patient is nonverbal is unable to give any history.   Level V caveat- condition of patient.    Past Medical History  Diagnosis Date  . Seizures   . Osteoporosis   . Thyroid disease     hypothyroid  . High cholesterol   . Cataracts, bilateral   . Optic atrophy   . Paraparesis     mild  . Hypothyroidism   . Anginal pain   . Coronary artery disease    Past Surgical History  Procedure Laterality Date  . Midline incision    . Central venous catheter insertion  05/15/2014       . Esophagogastroduodenoscopy N/A 06/03/2014    Procedure: ESOPHAGOGASTRODUODENOSCOPY (EGD);  Surgeon: Beryle Beams, MD;  Location: [facility] ENDOSCOPY;  Service: Endoscopy;  Laterality: N/A;   Family History  Problem Relation Age of Onset  . Colon cancer Mother   . Diabetes type II Sister    History  Substance Use Topics  . Smoking status: Never Smoker   . Smokeless tobacco: Never Used  . Alcohol Use: No   OB History   Grav Para Term Preterm Abortions TAB SAB Ect Mult Living   0              Review of Systems  Unable to perform ROS: Age      Allergies  Carvedilol and Ppd  Home Medications   Prior to Admission medications   Medication Sig Start Date End Date Taking? Authorizing Provider  acetaminophen (TYLENOL) 160 MG/5ML solution Place 20.3 mLs (650 mg total) into  feeding tube every 6 (six) hours as needed for mild pain, headache or fever. 06/09/14  Yes Bonnielee Haff, MD  albuterol (PROVENTIL) (2.5 MG/3ML) 0.083% nebulizer solution Take 3 mLs (2.5 mg total) by nebulization every 6 (six) hours as needed for wheezing or shortness of breath. 06/09/14  Yes Bonnielee Haff, MD  aspirin EC 81 MG tablet Take 1 tablet (81 mg total) by mouth daily. 06/09/14  Yes Bonnielee Haff, MD  atorvastatin (LIPITOR) 10 MG tablet Take 10 mg by mouth daily.   Yes Historical Provider, MD  bisacodyl (BISAC-EVAC) 10 MG suppository Place 10 mg rectally daily as needed for moderate constipation (in 24 hrs).   Yes Historical Provider, MD  calcium-vitamin D (OYSTER CALCIUM 500 + D) 500-200 MG-UNIT per tablet Take 1 tablet by mouth daily.   Yes Historical Provider, MD  levETIRAcetam (KEPPRA) 100 MG/ML solution Place 5 mLs (500 mg total) into feeding tube daily at 6 (six) AM. 06/09/14  Yes Bonnielee Haff, MD  levETIRAcetam (KEPPRA) 100 MG/ML solution 750mg  every day at 1400hrs and 2000hrs 06/09/14  Yes Bonnielee Haff, MD  levothyroxine (SYNTHROID, LEVOTHROID) 100 MCG tablet Take 100 mcg by mouth daily before breakfast.   Yes Historical Provider, MD  Magnesium Hydroxide (MILK OF MAGNESIA PO) Take 30 mLs by  mouth every three (3) days as needed (if no BM for 3 days.).   Yes Historical Provider, MD  metoprolol tartrate (LOPRESSOR) 25 MG tablet Take 12.5 mg by mouth 2 (two) times daily.   Yes Historical Provider, MD  Nutritional Supplements (FEEDING SUPPLEMENT, JEVITY 1.2 CAL,) LIQD Place 1,000 mLs into feeding tube daily. At 33ml/hr 06/09/14  Yes Bonnielee Haff, MD  polyethylene glycol (MIRALAX / GLYCOLAX) packet Take 17 g by mouth daily.   Yes Historical Provider, MD  Sodium Phosphates (RA SALINE ENEMA RE) Place 1 application rectally daily as needed (if not relieved by bisacodyl in 24 hrs).   Yes Historical Provider, MD  vitamin C (ASCORBIC ACID) 500 MG tablet Take 500 mg by mouth daily.    Yes  Historical Provider, MD  Water For Irrigation, Sterile (FREE WATER) SOLN Place 200 mLs into feeding tube every 6 (six) hours. 06/09/14  Yes Bonnielee Haff, MD  docusate (COLACE) 50 MG/5ML liquid Place 20 mLs (200 mg total) into feeding tube 2 (two) times daily. 06/09/14   Bonnielee Haff, MD  Valproic Acid (DEPAKENE) 250 MG/5ML SYRP syrup Place 15 mLs (750 mg total) into feeding tube 3 (three) times daily. 06/09/14   Bonnielee Haff, MD   BP 117/97  Pulse 101  Temp(Src) 99.4 F (37.4 C) (Rectal)  Resp 16  SpO2 100% Physical Exam  Nursing note and vitals reviewed. Constitutional:  Unable to answer questions (baseline), chronically ill   HENT:  Head: Normocephalic.  Mouth/Throat: Oropharynx is clear and moist.  Large occipital hematoma   Eyes: Conjunctivae and EOM are normal. Pupils are equal, round, and reactive to light.  Neck: Normal range of motion. Neck supple.  Cardiovascular: Normal rate, regular rhythm and normal heart sounds.   Pulmonary/Chest: Effort normal and breath sounds normal. No respiratory distress. She has no wheezes. She has no rales.  Abdominal: Soft. Bowel sounds are normal. She exhibits no distension. There is no tenderness. There is no rebound and no guarding.  G tube in place   Musculoskeletal:  Moving all extremities   Neurological: She is alert.  Moving all extremities   Skin: Skin is warm and dry.  Psychiatric:  Unable     ED Course  Procedures (including critical care time) Labs Review Labs Reviewed - No data to display  Imaging Review Dg Chest 1 View  08/03/2014   CLINICAL DATA:  Status post fall today.  EXAM: CHEST - 1 VIEW  COMPARISON:  Single view of the chest 06/09/2014 and 10/28/2013.  FINDINGS: Lungs are clear. Heart size is normal. No pneumothorax or pleural effusion. No focal bony abnormality. Cholecystectomy clips and feeding tube are noted.  IMPRESSION: No acute disease.   Electronically Signed   By: Inge Rise M.D.   On: 08/03/2014  17:03   Dg Pelvis 1-2 Views  08/03/2014   CLINICAL DATA:  Status post fall today.  Pain.  Initial encounter  EXAM: PELVIS - 1-2 VIEW  COMPARISON:  CT chest, abdomen and pelvis 06/01/2014.  FINDINGS: No acute bony or joint abnormality is identified. Large stool ball in the rectum is noted.  IMPRESSION: No acute finding.  Large stool ball in the rectum compatible with fecal impaction.   Electronically Signed   By: Inge Rise M.D.   On: 08/03/2014 17:05   Ct Head Wo Contrast  08/03/2014   CLINICAL DATA:  Status post fall approximately 3-4 feet. Occipital hematoma. The accident occurred today. Initial encounter.  EXAM: CT HEAD WITHOUT CONTRAST  CT CERVICAL  SPINE WITHOUT CONTRAST  TECHNIQUE: Multidetector CT imaging of the head and cervical spine was performed following the standard protocol without intravenous contrast. Multiplanar CT image reconstructions of the cervical spine were also generated.  COMPARISON:  Head CT scan 05/18/2014.  FINDINGS: CT HEAD FINDINGS  Cortical atrophy is identified as on the prior study. No evidence of acute abnormality including infarct, hemorrhage, mass lesion, mass effect, midline shift or abnormal extra-axial fluid collection is identified. Large scalp hematoma and laceration over the right parietal bone is identified. There is no underlying fracture or foreign body. Hyperostosis of the skull is noted as on prior studies. Chronic left mastoid effusion is unchanged. Prominence of the ventricular system is chronic and unchanged.  CT CERVICAL SPINE FINDINGS  Vertebral body height and alignment cervical spine are maintained. Intervertebral disc space height is also maintained. There is some anterior endplate spurring in the mid cervical spine. Lung apices demonstrates some ground-glass attenuation which is likely atelectasis but are otherwise unremarkable.  IMPRESSION: Large scalp hematoma over the right parietal bone without underlying fracture or acute intracranial  abnormality.  No acute abnormality cervical spine.  Chronic cerebral atrophy, unchanged in appearance.   Electronically Signed   By: Inge Rise M.D.   On: 08/03/2014 16:45   Ct Cervical Spine Wo Contrast  08/03/2014   CLINICAL DATA:  Status post fall approximately 3-4 feet. Occipital hematoma. The accident occurred today. Initial encounter.  EXAM: CT HEAD WITHOUT CONTRAST  CT CERVICAL SPINE WITHOUT CONTRAST  TECHNIQUE: Multidetector CT imaging of the head and cervical spine was performed following the standard protocol without intravenous contrast. Multiplanar CT image reconstructions of the cervical spine were also generated.  COMPARISON:  Head CT scan 05/18/2014.  FINDINGS: CT HEAD FINDINGS  Cortical atrophy is identified as on the prior study. No evidence of acute abnormality including infarct, hemorrhage, mass lesion, mass effect, midline shift or abnormal extra-axial fluid collection is identified. Large scalp hematoma and laceration over the right parietal bone is identified. There is no underlying fracture or foreign body. Hyperostosis of the skull is noted as on prior studies. Chronic left mastoid effusion is unchanged. Prominence of the ventricular system is chronic and unchanged.  CT CERVICAL SPINE FINDINGS  Vertebral body height and alignment cervical spine are maintained. Intervertebral disc space height is also maintained. There is some anterior endplate spurring in the mid cervical spine. Lung apices demonstrates some ground-glass attenuation which is likely atelectasis but are otherwise unremarkable.  IMPRESSION: Large scalp hematoma over the right parietal bone without underlying fracture or acute intracranial abnormality.  No acute abnormality cervical spine.  Chronic cerebral atrophy, unchanged in appearance.   Electronically Signed   By: Inge Rise M.D.   On: 08/03/2014 16:45     EKG Interpretation None      MDM   Final diagnoses:  None    Dawn [name] is a 68  y.o. female here with fall. Will get CT head/neck. Patient DNR.   5:27 PM CT head/neck showed no head bleed, just scalp hematoma. Pelvis xray showed stool impaction. Rectal exam showed no obvious stool impaction. Recommend miralax daily via G tube    Wandra Arthurs, MD 08/03/14 1729

## 2014-08-03 NOTE — Discharge Instructions (Signed)
You are constipated.   Try miralax via G tube daily as needed for constipation. Stop if she has diarrhea.   Follow up with your doctor.   Take tylenol 650 mg every 6 hrs as needed for pain.   Return to ER if she has another fall, vomiting, not behaving like herself.

## 2014-08-03 NOTE — ED Notes (Addendum)
Pt from [facility]. Per Ems pt was dropped accidentally from a mechanical lift at the facility. Fall was from approximately  3-4 feet . Pt presents with occipital hematoma. Per staff pt LOC is base line. Pt is blind bilaterally. Pt is fully dependant. Per EMS CBG 151. Pt is DNR

## 2014-08-03 NOTE — ED Notes (Signed)
Called [facility] for transportation back to nursing home.

## 2014-08-03 NOTE — ED Notes (Signed)
Bed: KM63 Expected date:  Expected time:  Means of arrival:  Comments: ems- pt fell from hoyer lift.

## 2014-08-03 NOTE — Progress Notes (Signed)
Patient ID: Dawn [name], female   DOB: 07-21-1946, 68 y.o.   MRN: [id number]    This encounter was created in error - please disregard.

## 2014-08-03 NOTE — ED Notes (Signed)
Patient transported to CT 

## 2014-08-03 NOTE — ED Notes (Signed)
[facility] here to transport pt to [facility] and [facility].

## 2014-08-05 ENCOUNTER — Encounter: Payer: Self-pay | Admitting: Internal Medicine

## 2014-08-05 NOTE — Progress Notes (Signed)
Patient ID: Dawn [name], female   DOB: 12/14/1945, 68 y.o.   MRN: [id number]   this is an acute visit.  Level of care skilled.  Facility AF  .  Chief Complaint   Acute visit secondary to fall with an occipital hematoma   .    HPI: Patient is 68 y.o. female who Has MR, seizures and CHF AS WELL as dysphagia status post PEG tube.  Patient needs total assistance with her ADLs-apparently she was in the HoyerLift about 4 feet aboveground when apparently there was a mechanical malfunction and patient sustained a fall to the floor apparently landing on her side and also hitting her head-patient does have a fairly large right occipital hematoma--there was no loss of consciousness and her vital signs appear to be stable-patient is nonverbal and cannot give any review of systems-neurologically she appears to be intact-but it appears this was a fairly significant fall with a fairly large hematoma as noted above.  Patient's only anticoagulant is aspirin    .  Past Medical History   Diagnosis  Date   .  Seizures    .  Osteoporosis    .  Thyroid disease      hypothyroid   .  High cholesterol    .  Cataracts, bilateral    .  Optic atrophy    .  Paraparesis      mild   .  Hypothyroidism    .  Anginal pain    .  Coronary artery disease     Past Surgical History   Procedure  Laterality  Date   .  Midline incision     .  Central venous catheter insertion   05/15/2014       .  Esophagogastroduodenoscopy  N/A  06/03/2014     Procedure: ESOPHAGOGASTRODUODENOSCOPY (EGD); Surgeon: Beryle Beams, MD; Location: [facility] ENDOSCOPY; Service: Endoscopy; Laterality: N/A;      Medication List         .            acetaminophen 160 MG/5ML solution    Commonly known as: TYLENOL    Place 20.3 mLs (650 mg total) into feeding tube every 6 (six) hours as needed for mild pain, headache or fever.    albuterol (2.5 MG/3ML) 0.083% nebulizer solution    Commonly known as: PROVENTIL    Take 3 mLs (2.5 mg  total) by nebulization every 6 (six) hours as needed for wheezing or shortness of breath.             aspirin EC 81 MG tablet    Take 1 tablet (81 mg total) by mouth daily.    atorvastatin 10 MG tablet    Commonly known as: LIPITOR    Take 10 mg by mouth daily.    docusate 50 MG/5ML liquid    Commonly known as: COLACE    Place 20 mLs (200 mg total) into feeding tube 2 (two) times daily.    feeding supplement (JEVITY 1.2 CAL) Liqd    Place 1,000 mLs into feeding tube daily. At 19ml/hr    free water Soln    Place 200 mLs into feeding tube every 6 (six) hours.    levETIRAcetam 100 MG/ML solution    Commonly known as: KEPPRA    Place 5 mLs (500 mg total) into feeding tube daily at 6 (six) AM.    levETIRAcetam 100 MG/ML solution    Commonly known as: KEPPRA    750mg  every  day at 1400hrs and 2000hrs    levothyroxine 88 MCG tablet    Commonly known as: SYNTHROID, LEVOTHROID    Take 1 tablet (88 mcg total) by mouth daily.    OYSTER CALCIUM 500 + D 500-200 MG-UNIT per tablet    Generic drug: calcium-vitamin D    Take 1 tablet by mouth daily.    polyethylene glycol packet    Commonly known as: MIRALAX / GLYCOLAX    Take 17 g by mouth daily.    Valproic Acid 250 MG/5ML Syrp syrup    Commonly known as: DEPAKENE    Place 15 mLs (750 mg total) into feeding tube 3 (three) times daily.    vitamin C 500 MG tablet    Commonly known as: ASCORBIC ACID    Take 500 mg by mouth 3 (three) times daily.     No orders of the defined types were placed in this encounter.  There is no immunization history on file for this patient  Lopressor-6.25 mg twice a day  .  History   Substance Use Topics   .  Smoking status:  Never Smoker   .  Smokeless tobacco:  Never Used   .  Alcohol Use:  No   Family history is noncontributory  Review of Systems UTO - pt non verbal MR--please see history of present illness                   Physical Exam  She is afebrile pulse of 108 respirations 22 blood  pressure 134/80 O2 saturation is 97%  Currently she is afebrile with pulse of 96-respirations 18 blood pressure  GENERAL APPEARANCE: Alert,non conversant sitting essentially lying back on the floor-appears to be at her baseline nonverbal SKIN: Has a large right occipital hematoma-I do not see any drainage bleeding or open areas ---- No diaphoresis rash --she does have some chronic foot wounds that are followed by the wound care physician    HEAD: Appears at baseline except for the subdural hematoma-  EYES: Conjunctiva/lids clear. She has cataracts which is baseline --I do not see any changes from baseline.  EARS: Ex. Hearing grossly normal.  NOSE: No deformity or discharge.  MOUTH/THROAT: Lips w/o lesions I did not note any bleeding or cuts.  RESPIRATORY: Breathing is even, unlabored. Lung sounds are clear--poor effort patient does not really respond to verbal commands  CARDIOVASCULAR: Heart RRR slightly tachycardic which is not unusual--s-- no murmurs, rubs or gallops.slight Pedal edema.  GASTROINTESTINAL: Abdomen is soft, non-tender, not distended w/ normal bowel sounds;PEG appears unremarkable without surrounding erythema firmness or tenderness minimal baseline drainage  MUSCULOSKELETAL: legs contractured in flexion; BUE contractures --I do not note any non-baselinet deformities or pain with gentle  passive range of motion NEUROLOGIC: There is at baseline does have some movement of her upper extremities continues with baseline facial droop-has contractures of her lower extremities  PSYCHIATRIC: flat affect--a history of mental retardation---  Patient Active Problem List    Diagnosis  Date Noted   .  Sepsis  06/12/2014   .  Facial droop  06/12/2014   .  Hypernatremia  06/12/2014   .  PEG (percutaneous endoscopic gastrostomy) status  06/12/2014   .  FUO (fever of unknown origin)  06/02/2014   .  Paroxysmal a-fib  05/14/2014   .  Dysphasia  05/14/2014   .  UTI (lower urinary tract  infection)  05/14/2014   .  Tooth decay  05/14/2014   .  SIRS (systemic  inflammatory response syndrome)  12/15/2013   .  Thrombocytopenia  10/29/2013   .  Hypotension  10/29/2013   .  Anemia  10/29/2013   .  Diastolic heart failure, NYHA class 2  10/29/2013   .  Atrial fibrillation with rapid ventricular response  10/28/2013   .  Seizure disorder  10/28/2013   .  MR (mental retardation)  10/28/2013   .  Hypothyroidism  10/28/2013   .  HLD (hyperlipidemia)  10/28/2013   .  Non-ST elevation myocardial infarction (NSTEMI)  10/28/2013   Labs.  07/27/2014.  WBC 12.2 hemoglobin 9.3 platelets 298.  Sodium 16 potassium 4.4 BUN 16 creatinine 0.4.    07/20/2014.  WBC 12.4  hemoglobin 8.2 platelets 478-differential shows mildly elevated neutrophil absolute at 6.9 had been 7.7 a month ago.  Sodium 136 potassium 4.4 BUN 17 creatinine 0.4.  Liver function tests unremarkable except for an albumin of 2.3.  TSH-5.50  .  06/25/2014.  Sodium 137 potassium 4.5 BUN 15 creatinine 0.5.  WBC 12.3 hemoglobin 8.6 platelets 289-    Component  Value  Date/Time    WBC  9.7  06/08/2014 0715    RBC  3.47*  06/08/2014 0715    RBC  1.74*  05/15/2014 0740    HGB  10.3*  06/08/2014 0715    HCT  31.8*  06/08/2014 0715    PLT  164  06/08/2014 0715    MCV  91.6  06/08/2014 0715    LYMPHSABS  1.8  06/04/2014 0635    MONOABS  1.6*  06/04/2014 0635    EOSABS  0.0  06/04/2014 0635    BASOSABS  0.0  06/04/2014 0635   CMP    Component  Value  Date/Time    NA  147  06/09/2014 0557    K  4.0  06/09/2014 0557    CL  110  06/09/2014 0557    CO2  29  06/09/2014 0557    GLUCOSE  123*  06/09/2014 0557    BUN  6  06/09/2014 0557    CREATININE  0.51  06/09/2014 0557    CALCIUM  8.0*  06/09/2014 0557    PROT  6.1  05/31/2014 0520    ALBUMIN  1.2*  05/31/2014 0520    AST  13  05/31/2014 0520    ALT  9  05/31/2014 0520    ALKPHOS  200*  05/31/2014 0520    BILITOT  0.3  05/31/2014 0520    GFRNONAA  >90  06/09/2014 0557    GFRAA  >90   06/09/2014 0557   Assessment and Plan  Fall with occipital hematoma-at this point patient appears relatively baseline per physical exam with the exception of the largeoccipitall hematoma-however this appears to have been  fairly significant fall--with head trauma we'll send to the ER for expedient evaluation.  Per serial exam patient appeared to be neurologically at baseline and stable-she was monitored until EMS arrived and had no acute change in her status  Addendum-I did reevaluate patient after she arrived at the facility after ER evaluation-CT scan of the head-cervical area as well as chest x-ray did not show any acute process --pelvic x-ray did show possibility of a stool impaction-she was discharged on MiraLax daily via PEG tube.  She appears to be at her baseline this evening abdomen is not distended bowel sounds are positive will have to monitor this.  She is in no distress at this point continue to monitor status post fall  protocol with neuro checks  [id number]

## 2014-08-09 ENCOUNTER — Encounter: Payer: Self-pay | Admitting: Internal Medicine

## 2014-08-09 ENCOUNTER — Non-Acute Institutional Stay (SKILLED_NURSING_FACILITY): Payer: Medicare Other | Admitting: Internal Medicine

## 2014-08-09 DIAGNOSIS — T148 Other injury of unspecified body region: Secondary | ICD-10-CM

## 2014-08-09 DIAGNOSIS — R609 Edema, unspecified: Secondary | ICD-10-CM

## 2014-08-09 DIAGNOSIS — T148XXA Other injury of unspecified body region, initial encounter: Secondary | ICD-10-CM

## 2014-08-09 NOTE — Progress Notes (Signed)
Patient ID: Dawn [name], female   DOB: [birth year], 68 y.o.   MRN: [id number]  this is an acute visit.  Level of care skilled.  [facility].   Chief Complaint   Acute visit follow-up fall with an occipital  hematoma-also right orbital edema   .    HPI: Patient is 68 y.o. female who Has MR, seizures and CHF AS WELL as dysphagia status post PEG tube.   Several days ago patient sustained a fairly significant fall from a Hoyer lift-sustained an anteroseptal hematoma-she did get evaluated in the ER were studies were negative for an acute process or intracranial abnormality.  She is return to the facility and essentially has been at her baseline-she does have a history stream mental retardation as well as dysphagia with PEG tube placement.  Apparently last day or 2 staff has noted somewhat of a addendum must right side of the face and the orbital area-patient is a poor historian but apparently there has been not really any complaints of pain   her vital signs have been stable  Neurologically she appears to be at her baseline.     .  Past Medical History   Diagnosis  Date   .  Seizures    .  Osteoporosis    .  Thyroid disease      hypothyroid   .  High cholesterol    .  Cataracts, bilateral    .  Optic atrophy    .  Paraparesis      mild   .  Hypothyroidism    .  Anginal pain    .  Coronary artery disease     Past Surgical History   Procedure  Laterality  Date   .  Midline incision     .  Central venous catheter insertion   05/15/2014       .  Esophagogastroduodenoscopy  N/A  06/03/2014     Procedure: ESOPHAGOGASTRODUODENOSCOPY (EGD); Surgeon: Beryle Beams, MD; Location: [facility] ENDOSCOPY; Service: Endoscopy; Laterality: N/A;      Medication List         .            acetaminophen 160 MG/5ML solution    Commonly known as: TYLENOL    Place 20.3 mLs (650 mg total) into  feeding tube every 6 (six) hours as needed for mild pain, headache or fever.    albuterol (2.5 MG/3ML) 0.083% nebulizer solution    Commonly known as: PROVENTIL    Take 3 mLs (2.5 mg total) by nebulization every 6 (six) hours as needed for wheezing or shortness of breath.             aspirin EC 81 MG tablet    Take 1 tablet (81 mg total) by mouth daily.    atorvastatin 10 MG tablet    Commonly known as: LIPITOR    Take 10 mg by mouth daily.    docusate 50 MG/5ML liquid    Commonly known as: COLACE    Place 20 mLs (200 mg total) into feeding tube 2 (two) times daily.    feeding supplement (JEVITY 1.2 CAL) Liqd    Place 1,000 mLs into feeding tube daily. At 66ml/hr    free water Soln    Place 200 mLs into feeding tube every 6 (six) hours.    levETIRAcetam 100 MG/ML solution    Commonly known as: KEPPRA    Place 5 mLs (500 mg total) into feeding tube daily  at 6 (six) AM.    levETIRAcetam 100 MG/ML solution    Commonly known as: KEPPRA    750mg  every day at 1400hrs and 2000hrs    levothyroxine 88 MCG tablet    Commonly known as: SYNTHROID, LEVOTHROID    Take 1 tablet (88 mcg total) by mouth daily.    OYSTER CALCIUM 500 + D 500-200 MG-UNIT per tablet    Generic drug: calcium-vitamin D    Take 1 tablet by mouth daily.    polyethylene glycol packet    Commonly known as: MIRALAX / GLYCOLAX    Take 17 g by mouth daily.    Valproic Acid 250 MG/5ML Syrp syrup    Commonly known as: DEPAKENE    Place 15 mLs (750 mg total) into feeding tube 3 (three) times daily.    vitamin C 500 MG tablet    Commonly known as: ASCORBIC ACID    Take 500 mg by mouth 3 (three) times daily.     No orders of the defined types were placed in this encounter.  There is no immunization history on file for this patient  Lopressor-6.25 mg twice a day  .  History   Substance Use Topics   .  Smoking status:  Never Smoker   .  Smokeless tobacco:   Never Used   .  Alcohol Use:  No   Family history is noncontributory  Review of Systems UTO - pt non verbal MR--please see history of present illness                   Physical Exam  She is afebrile pulse of 98 respirations 22 blood pressure 107/69  Currently she is afebrile with pulse of 96-respirations 18 blood pressure  GENERAL APPEARANCE: Alert,non conversant sitting comfortably in her wheelchair SKIN: decreased size of the right occipital hematoma-there is a small Band-Aid applied over the area     HEAD: does appear to have some mild edema right side of her face orbital area this does not appear to be erythematous or tender- or  warm EYES: Conjunctiva/lids clear. She has cataracts which is baseline --I do not see any changes from baseline. She does have some swelling around the orbital area as noted above again this is non-tender not erythematous and not warm EARS: Ex. Hearing grossly normal.  NOSE: No deformity or discharge.  MOUTH/THROAT: Lips w/o lesions I did not note any bleeding or cuts.  RESPIRATORY: Breathing is even, unlabored. Lung sounds are clear--poor effort patient does not really respond to verbal commands  CARDIOVASCULAR: Heart RRR slightly tachycardic which is not unusual--s-- no murmurs, rubs or gallops.slight Pedal edema.  GASTROINTESTINAL: Abdomen is soft, non-tender, not distended w/ normal bowel sounds;PEG appears unremarkable without surrounding erythema firmness or tenderness minimal baseline drainage  MUSCULOSKELETAL: legs contractured in flexion; BUE contractures --I do not note any non-baselinet deformities  NEUROLOGIC: There is at baseline does have some movement of her upper extremities continues with baseline facial droop-has contractures of her lower extremities  PSYCHIATRIC: flat affect--a history of mental retardation---  Patient Active Problem List    Diagnosis  Date Noted   .  Sepsis  06/12/2014   .  Facial  droop  06/12/2014   .  Hypernatremia  06/12/2014   .  PEG (percutaneous endoscopic gastrostomy) status  06/12/2014   .  FUO (fever of unknown origin)  06/02/2014   .  Paroxysmal a-fib  05/14/2014   .  Dysphasia  05/14/2014   .  UTI (lower urinary tract infection)  05/14/2014   .  Tooth decay  05/14/2014   .  SIRS (systemic inflammatory response syndrome)  12/15/2013   .  Thrombocytopenia  10/29/2013   .  Hypotension  10/29/2013   .  Anemia  10/29/2013   .  Diastolic heart failure, NYHA class 2  10/29/2013   .  Atrial fibrillation with rapid ventricular response  10/28/2013   .  Seizure disorder  10/28/2013   .  MR (mental retardation)  10/28/2013   .  Hypothyroidism  10/28/2013   .  HLD (hyperlipidemia)  10/28/2013   .  Non-ST elevation myocardial infarction (NSTEMI)  10/28/2013   Labs.  07/27/2014.  WBC 12.2 hemoglobin 9.3 platelets 298.  Sodium 16 potassium 4.4 BUN 16 creatinine 0.4.   07/20/2014.  WBC 12.4  hemoglobin 8.2 platelets 478-differential shows mildly elevated neutrophil absolute at 6.9 had been 7.7 a month ago.  Sodium 136 potassium 4.4 BUN 17 creatinine 0.4.  Liver function tests unremarkable except for an albumin of 2.3.  TSH-5.50  .  06/25/2014.  Sodium 137 potassium 4.5 BUN 15 creatinine 0.5.  WBC 12.3 hemoglobin 8.6 platelets 289-    Component  Value  Date/Time    WBC  9.7  06/08/2014 0715    RBC  3.47*  06/08/2014 0715    RBC  1.74*  05/15/2014 0740    HGB  10.3*  06/08/2014 0715    HCT  31.8*  06/08/2014 0715    PLT  164  06/08/2014 0715    MCV  91.6  06/08/2014 0715    LYMPHSABS  1.8  06/04/2014 0635    MONOABS  1.6*  06/04/2014 0635    EOSABS  0.0  06/04/2014 0635    BASOSABS  0.0  06/04/2014 0635   CMP    Component  Value  Date/Time    NA  147  06/09/2014 0557    K  4.0  06/09/2014 0557    CL  110  06/09/2014 0557    CO2   29  06/09/2014 0557    GLUCOSE  123*  06/09/2014 0557    BUN  6  06/09/2014 0557    CREATININE  0.51  06/09/2014 0557    CALCIUM  8.0*  06/09/2014 0557    PROT  6.1  05/31/2014 0520    ALBUMIN  1.2*  05/31/2014 0520    AST  13  05/31/2014 0520    ALT  9  05/31/2014 0520    ALKPHOS  200*  05/31/2014 0520    BILITOT  0.3  05/31/2014 0520    GFRNONAA  >90  06/09/2014 0557    GFRAA  >90  06/09/2014 0557   Assessment and Plan   #1-facial edema-at this point will monitor-apparently she does lay on that side which could be the etiology of dependent edema this will have to be watched carefully monitor with vital signs every shift-  does not appear to be having any discomfort with thisor visual changes although again this is somewhat difficult to tell with patient's mental status and history of mental retardation  Workup in the ER after the fall was unremarkable including the CT scan of the head  We will update lab work including a CBC and basic metabolic panel.  #2 status post fall with ER evaluation-neurologically she appears to be at her baseline I do not really see anything different today which is encouraging.  [id number] note greater than 25 minutes spent assessing patient-discussing  her status with nursing staff-reviewing her medical records-and coordinating plan of care-of note greater than 50% of time spent coordinating plan of care      .

## 2014-08-13 ENCOUNTER — Non-Acute Institutional Stay (SKILLED_NURSING_FACILITY): Payer: Medicare Other | Admitting: Internal Medicine

## 2014-08-13 DIAGNOSIS — S0003XD Contusion of scalp, subsequent encounter: Secondary | ICD-10-CM

## 2014-08-13 DIAGNOSIS — F79 Unspecified intellectual disabilities: Secondary | ICD-10-CM

## 2014-08-13 NOTE — Progress Notes (Addendum)
MRN: [id number] Name: Dawn [name]  Sex: female Age: 68 y.o. DOB: [birth year]  [place] #: [name] Facility/Room:  Level Of Care: SNF Provider: Inocencio Homes D Emergency Contacts: Extended Emergency Contact Information Primary Emergency Contact: Sellars,[name] Address: [address]          [place] [address] [place] Phone: [phone] Relation: Sister Secondary Emergency Contact: Morna Cower [place] Phone: [phone] Relation: None  Code Status: DNR  Allergies: Carvedilol and Ppd  Chief Complaint  Patient presents with  . Acute Visit    HPI: Patient is 68 y.o. female with severe MR and dementia who is being seen for a persistent scalp hematoma.   Past Medical History  Diagnosis Date  . Seizures   . Osteoporosis   . Thyroid disease     hypothyroid  . High cholesterol   . Cataracts, bilateral   . Optic atrophy   . Paraparesis     mild  . Hypothyroidism   . Anginal pain   . Coronary artery disease     Past Surgical History  Procedure Laterality Date  . Midline incision    . Central venous catheter insertion  05/15/2014       . Esophagogastroduodenoscopy N/A 06/03/2014    Procedure: ESOPHAGOGASTRODUODENOSCOPY (EGD);  Surgeon: Beryle Beams, MD;  Location: [facility] ENDOSCOPY;  Service: Endoscopy;  Laterality: N/A;      Medication List       This list is accurate as of: 08/13/14 11:59 PM.  Always use your most recent med list.               acetaminophen 160 MG/5ML solution  Commonly known as:  TYLENOL  Place 20.3 mLs (650 mg total) into feeding tube every 6 (six) hours as needed for mild pain, headache or fever.     albuterol (2.5 MG/3ML) 0.083% nebulizer solution  Commonly known as:  PROVENTIL  Take 3 mLs (2.5 mg total) by nebulization every 6 (six) hours as needed for wheezing or shortness of breath.     aspirin EC 81 MG tablet  Take 1 tablet (81 mg total) by mouth daily.     atorvastatin 10 MG  tablet  Commonly known as:  LIPITOR  Take 10 mg by mouth daily.     BISAC-EVAC 10 MG suppository  Generic drug:  bisacodyl  Place 10 mg rectally daily as needed for moderate constipation (in 24 hrs).     docusate 50 MG/5ML liquid  Commonly known as:  COLACE  Place 20 mLs (200 mg total) into feeding tube 2 (two) times daily.     feeding supplement (JEVITY 1.2 CAL) Liqd  Place 1,000 mLs into feeding tube daily. At 73ml/hr     free water Soln  Place 200 mLs into feeding tube every 6 (six) hours.     levETIRAcetam 100 MG/ML solution  Commonly known as:  KEPPRA  Place 5 mLs (500 mg total) into feeding tube daily at 6 (six) AM.     levETIRAcetam 100 MG/ML solution  Commonly known as:  KEPPRA  750mg  every day at 1400hrs and 2000hrs     levothyroxine 100 MCG tablet  Commonly known as:  SYNTHROID, LEVOTHROID  Take 100 mcg by mouth daily before breakfast.     metoprolol tartrate 25 MG tablet  Commonly known as:  LOPRESSOR  Take 12.5 mg by mouth 2 (two) times daily.     MILK OF MAGNESIA PO  Take 30 mLs by  mouth every three (3) days as needed (if no BM for 3 days.).     OYSTER CALCIUM 500 + D 500-200 MG-UNIT per tablet  Generic drug:  calcium-vitamin D  Take 1 tablet by mouth daily.     polyethylene glycol packet  Commonly known as:  MIRALAX / GLYCOLAX  Take 17 g by mouth daily.     polyethylene glycol packet  Commonly known as:  MIRALAX / GLYCOLAX  Take 17 g by mouth daily.     RA SALINE ENEMA RE  Place 1 application rectally daily as needed (if not relieved by bisacodyl in 24 hrs).     Valproic Acid 250 MG/5ML Syrp syrup  Commonly known as:  DEPAKENE  Place 15 mLs (750 mg total) into feeding tube 3 (three) times daily.     vitamin C 500 MG tablet  Commonly known as:  ASCORBIC ACID  Take 500 mg by mouth daily.        No orders of the defined types were placed in this encounter.     There is no immunization history on file for this patient.  History   Substance Use Topics  . Smoking status: Never Smoker   . Smokeless tobacco: Never Used  . Alcohol Use: No    Review of Systems   UTO from pt; per nursinf no apparent discomfort, change in mental status, fever. R eye swelling is improved, scalp hematoma may be larger   Filed Vitals:   08/13/14 1536  BP: 123/79  Pulse: 108  Temp: 98 F (36.7 C)  Resp: 19    Physical Exam  GENERAL APPEARANCE: Alert, nonconversant, No acute distress  SKIN: approx 5 cm by 6 cm hematoam R patietal region with central escar about 2 cm seen 2 days ago;hematoma seems larger , is fluctuant HEENT: Unremarkable RESPIRATORY: Breathing is even, unlabored. Lung sounds are clear   CARDIOVASCULAR: Heart RRR no murmurs, rubs or gallops. No peripheral edema  GASTROINTESTINAL: Abdomen is soft, non-tender, not distended w/ normal bowel sounds.  GENITOURINARY: Bladder non tender, not distended  MUSCULOSKELETAL: contractures NEUROLOGIC: Cranial nerves 2-12 grossly intact. Moves upper  extremities PSYCHIATRIC: MR, dementia, no behavioral issues  Patient Active Problem List   Diagnosis Date Noted  . Hematoma of right parietal scalp 08/13/2014  . Edema 08/09/2014  . Hematoma 08/05/2014  . Fall at nursing home 08/05/2014  . Leukocytosis 07/26/2014  . Tachycardia 07/11/2014  . Leukocytopenia, unspecified 07/11/2014  . Pneumonia 06/17/2014  . Sepsis 06/12/2014  . Facial droop 06/12/2014  . Hypernatremia 06/12/2014  . PEG (percutaneous endoscopic gastrostomy) status 06/12/2014  . FUO (fever of unknown origin) 06/02/2014  . Paroxysmal a-fib 05/14/2014  . Dysphasia 05/14/2014  . UTI (lower urinary tract infection) 05/14/2014  . Tooth decay 05/14/2014  . SIRS (systemic inflammatory response syndrome) 12/15/2013  . Thrombocytopenia 10/29/2013  . Hypotension 10/29/2013  . Anemia of chronic disease 10/29/2013  . Diastolic heart failure, NYHA class 2 10/29/2013  . Atrial fibrillation with rapid ventricular  response 10/28/2013  . Seizure disorder 10/28/2013  . MR (mental retardation) 10/28/2013  . Hypothyroidism 10/28/2013  . HLD (hyperlipidemia) 10/28/2013  . Non-ST elevation myocardial infarction (NSTEMI) 10/28/2013    CBC    Component Value Date/Time   WBC 9.7 06/08/2014 0715   RBC 3.47* 06/08/2014 0715   RBC 1.74* 05/15/2014 0740   HGB 10.3* 06/08/2014 0715   HCT 31.8* 06/08/2014 0715   PLT 164 06/08/2014 0715   MCV 91.6 06/08/2014 0715   LYMPHSABS 1.8  06/04/2014 0635   MONOABS 1.6* 06/04/2014 0635   EOSABS 0.0 06/04/2014 0635   BASOSABS 0.0 06/04/2014 0635    CMP     Component Value Date/Time   NA 147 06/09/2014 0557   K 4.0 06/09/2014 0557   CL 110 06/09/2014 0557   CO2 29 06/09/2014 0557   GLUCOSE 123* 06/09/2014 0557   BUN 6 06/09/2014 0557   CREATININE 0.51 06/09/2014 0557   CALCIUM 8.0* 06/09/2014 0557   PROT 6.1 05/31/2014 0520   ALBUMIN 1.2* 05/31/2014 0520   AST 13 05/31/2014 0520   ALT 9 05/31/2014 0520   ALKPHOS 200* 05/31/2014 0520   BILITOT 0.3 05/31/2014 0520   GFRNONAA >90 06/09/2014 0557   GFRAA >90 06/09/2014 0557    Assessment and Plan  Hematoma of right parietal scalp Sustained a week ago when pt fell from a hoyer lift. Pt was seen in Ed where CT head was neg for acute bleed or fracture. Presumed there is only blood in it.Because of the eschar on it there is some concern that this pocket of blood has some pus in it as well. Will start doxycycline 100 mg BID for 7 days  MR (mental retardation) Complication factor in any procedure on this pt   Date of service 08/11/2014 Hennie Duos, MD

## 2014-08-13 NOTE — Assessment & Plan Note (Addendum)
Sustained a week ago when pt fell from a hoyer lift. Pt was seen in Ed where CT head was neg for acute bleed or fracture. Presumed there is only blood in it.Because of the eschar on it there is some concern that this pocket of blood has some pus in it as well. Will start doxycycline 100 mg BID for 7 days and plan I and D if no improvement.

## 2014-08-22 ENCOUNTER — Encounter: Payer: Self-pay | Admitting: Internal Medicine

## 2014-08-22 ENCOUNTER — Non-Acute Institutional Stay (SKILLED_NURSING_FACILITY): Payer: Medicare Other | Admitting: Internal Medicine

## 2014-08-22 DIAGNOSIS — S0003XD Contusion of scalp, subsequent encounter: Secondary | ICD-10-CM

## 2014-08-22 NOTE — Assessment & Plan Note (Signed)
Complication factor in any procedure on this pt

## 2014-08-22 NOTE — Progress Notes (Signed)
MRN: [id number] Name: Dawn [name]  Sex: female Age: 68 y.o. DOB: 22-Oct-1945  [place] #:  Facility/Room: Level Of Care: SNF Provider: Inocencio Homes D Emergency Contacts: Extended Emergency Contact Information Primary Emergency Contact: Sellars,[name] Address: [address]          [place] [address] [place] Phone: [phone] Relation: Sister Secondary Emergency Contact: Johnnette Cower [place] Phone: [phone] Relation: None  Code Status:   Allergies: Carvedilol and Ppd  Chief Complaint  Patient presents with  . Acute Visit    HPI: Patient is 68 y.o. female who   Past Medical History  Diagnosis Date  . Seizures   . Osteoporosis   . Thyroid disease     hypothyroid  . High cholesterol   . Cataracts, bilateral   . Optic atrophy   . Paraparesis     mild  . Hypothyroidism   . Anginal pain   . Coronary artery disease     Past Surgical History  Procedure Laterality Date  . Midline incision    . Central venous catheter insertion  05/15/2014       . Esophagogastroduodenoscopy N/A 06/03/2014    Procedure: ESOPHAGOGASTRODUODENOSCOPY (EGD);  Surgeon: Beryle Beams, MD;  [facility] END[facility]COPY;  Service: Endoscopy;  Laterality: N/A;      Medication List       This list is accurate as of: 08/22/14  8:22 PM.  Always use your most recent med list.               acetaminophen 160 MG/5ML solution  Commonly known as:  TYLENOL  Place 20.3 mLs (650 mg total) into feeding tube every 6 (six) hours as needed for mild pain, headache or fever.     albuterol (2.5 MG/3ML) 0.083% nebulizer solution  Commonly known as:  PROVENTIL  Take 3 mLs (2.5 mg total) by nebulization every 6 (six) hours as needed for wheezing or shortness of breath.     aspirin EC 81 MG tablet  Take 1 tablet (81 mg total) by mouth daily.     atorvastatin 10 MG tablet  Commonly known as:  LIPITOR  Take 10 mg by mouth daily.     BISAC-EVAC 10 MG  suppository  Generic drug:  bisacodyl  Place 10 mg rectally daily as needed for moderate constipation (in 24 hrs).     docusate 50 MG/5ML liquid  Commonly known as:  COLACE  Place 20 mLs (200 mg total) into feeding tube 2 (two) times daily.     feeding supplement (JEVITY 1.2 CAL) Liqd  Place 1,000 mLs into feeding tube daily. At 14ml/hr     free water Soln  Place 200 mLs into feeding tube every 6 (six) hours.     levETIRAcetam 100 MG/ML solution  Commonly known as:  KEPPRA  Place 5 mLs (500 mg total) into feeding tube daily at 6 (six) AM.     levETIRAcetam 100 MG/ML solution  Commonly known as:  KEPPRA  750mg  every day at 1400hrs and 2000hrs     levothyroxine 100 MCG tablet  Commonly known as:  SYNTHROID, LEVOTHROID  Take 100 mcg by mouth daily before breakfast.     metoprolol tartrate 25 MG tablet  Commonly known as:  LOPRESSOR  Take 12.5 mg by mouth 2 (two) times daily.     MILK OF MAGNESIA PO  Take 30 mLs by mouth every three (3) days as needed (if no BM for 3 days.).  OYSTER CALCIUM 500 + D 500-200 MG-UNIT per tablet  Generic drug:  calcium-vitamin D  Take 1 tablet by mouth daily.     polyethylene glycol packet  Commonly known as:  MIRALAX / GLYCOLAX  Take 17 g by mouth daily.     polyethylene glycol packet  Commonly known as:  MIRALAX / GLYCOLAX  Take 17 g by mouth daily.     RA SALINE ENEMA RE  Place 1 application rectally daily as needed (if not relieved by bisacodyl in 24 hrs).     Valproic Acid 250 MG/5ML Syrp syrup  Commonly known as:  DEPAKENE  Place 15 mLs (750 mg total) into feeding tube 3 (three) times daily.     vitamin C 500 MG tablet  Commonly known as:  ASCORBIC ACID  Take 500 mg by mouth daily.        No orders of the defined types were placed in this encounter.     There is no immunization history on file for this patient.  History  Substance Use Topics  . Smoking status: Never Smoker   . Smokeless tobacco: Never Used  .  Alcohol Use: No    Review of Systems  UTO from pt    There were no vitals filed for this visit.  Physical Exam  GENERAL APPEARANCE: Alert, nonconversant, No acute distress  SKIN: R scalp approx 5 X 6 cm fluctuant mass;central eschar has not improved HEENT: Unremarkable RESPIRATORY: Breathing is even, unlabored. Lung sounds are clear   CARDIOVASCULAR: Heart RRR no murmurs, rubs or gallops. No peripheral edema  GASTROINTESTINAL: Abdomen is soft, non-tender, not distended w/ normal bowel sounds.  GENITOURINARY: Bladder non tender, not distended  MUSCULOSKELETAL: contractures NEUROLOGIC: Cranial nerves 2-12 grossly intact. PSYCHIATRIC: dementia and MR;no behavioral issues  Procedure: after sterile prep and local with lidocaine 1% most fluctuant area next to eschar was opened with 11 blade with return of small amount of blood; inside area swept and to my surprise multiple large clots were adherent to the cavity walls and could not be expressed. They had to be manually removed with instruments. Again and again the inside of hematoma revealed more and more clot material. When all was removed cavity was packed with iodiform guaze to allow drainage and packing will be pulled in approx 3 days unless there is large drainage. Because instruments were inside cavity do much longer than usual will cover pt with a total 10 days of doxycycline. Pt tolerated procedure very well.   Patient Active Problem List   Diagnosis Date Noted  . Hematoma of right parietal scalp 08/13/2014  . Edema 08/09/2014  . Hematoma 08/05/2014  . Fall at nursing home 08/05/2014  . Leukocytosis 07/26/2014  . Tachycardia 07/11/2014  . Leukocytopenia, unspecified 07/11/2014  . Pneumonia 06/17/2014  . Sepsis 06/12/2014  . Facial droop 06/12/2014  . Hypernatremia 06/12/2014  . PEG (percutaneous endoscopic gastrostomy) status 06/12/2014  . FUO (fever of unknown origin) 06/02/2014  . Paroxysmal a-fib 05/14/2014  . Dysphasia  05/14/2014  . UTI (lower urinary tract infection) 05/14/2014  . Tooth decay 05/14/2014  . SIRS (systemic inflammatory response syndrome) 12/15/2013  . Thrombocytopenia 10/29/2013  . Hypotension 10/29/2013  . Anemia of chronic disease 10/29/2013  . Diastolic heart failure, NYHA class 2 10/29/2013  . Atrial fibrillation with rapid ventricular response 10/28/2013  . Seizure disorder 10/28/2013  . MR (mental retardation) 10/28/2013  . Hypothyroidism 10/28/2013  . HLD (hyperlipidemia) 10/28/2013  . Non-ST elevation myocardial infarction (NSTEMI) 10/28/2013  CBC    Component Value Date/Time   WBC 9.7 06/08/2014 0715   RBC 3.47* 06/08/2014 0715   RBC 1.74* 05/15/2014 0740   HGB 10.3* 06/08/2014 0715   HCT 31.8* 06/08/2014 0715   PLT 164 06/08/2014 0715   MCV 91.6 06/08/2014 0715   LYMPHSABS 1.8 06/04/2014 0635   MONOABS 1.6* 06/04/2014 0635   EOSABS 0.0 06/04/2014 0635   BASOSABS 0.0 06/04/2014 0635    CMP     Component Value Date/Time   NA 147 06/09/2014 0557   K 4.0 06/09/2014 0557   CL 110 06/09/2014 0557   CO2 29 06/09/2014 0557   GLUCOSE 123* 06/09/2014 0557   BUN 6 06/09/2014 0557   CREATININE 0.51 06/09/2014 0557   CALCIUM 8.0* 06/09/2014 0557   PROT 6.1 05/31/2014 0520   ALBUMIN 1.2* 05/31/2014 0520   AST 13 05/31/2014 0520   ALT 9 05/31/2014 0520   ALKPHOS 200* 05/31/2014 0520   BILITOT 0.3 05/31/2014 0520   GFRNONAA >90 06/09/2014 0557   GFRAA >90 06/09/2014 0557    Assessment and Plan  Hematoma of right parietal scalp Hematoma is not resolving-if anything it is getting bigger and the eschar on it is without change also .Plan I and D  Date of service 08/13/2014 Hennie Duos, MD

## 2014-08-22 NOTE — Assessment & Plan Note (Signed)
Hematoma is not resolving-if anything it is getting bigger and the eschar on it is without change also .Plan I and D

## 2014-08-29 ENCOUNTER — Encounter: Payer: Self-pay | Admitting: Internal Medicine

## 2014-09-13 ENCOUNTER — Non-Acute Institutional Stay (SKILLED_NURSING_FACILITY): Payer: Medicare Other | Admitting: Nurse Practitioner

## 2014-09-13 DIAGNOSIS — F79 Unspecified intellectual disabilities: Secondary | ICD-10-CM

## 2014-09-13 DIAGNOSIS — G40909 Epilepsy, unspecified, not intractable, without status epilepticus: Secondary | ICD-10-CM

## 2014-09-13 DIAGNOSIS — E038 Other specified hypothyroidism: Secondary | ICD-10-CM

## 2014-09-13 DIAGNOSIS — E785 Hyperlipidemia, unspecified: Secondary | ICD-10-CM

## 2014-09-13 DIAGNOSIS — S0003XD Contusion of scalp, subsequent encounter: Secondary | ICD-10-CM

## 2014-09-13 DIAGNOSIS — D638 Anemia in other chronic diseases classified elsewhere: Secondary | ICD-10-CM

## 2014-09-13 DIAGNOSIS — I480 Paroxysmal atrial fibrillation: Secondary | ICD-10-CM

## 2014-09-13 DIAGNOSIS — I5030 Unspecified diastolic (congestive) heart failure: Secondary | ICD-10-CM

## 2014-09-13 NOTE — Progress Notes (Signed)
Patient ID: Dawn [name], female   DOB: 09-15-1946, 68 y.o.   MRN: [id number]    Nursing Home Location:  [place] [facility] of Service: SNF (31)  PCP: [name]  Allergies  Allergen Reactions  . Carvedilol   . Ppd [Tuberculin Purified Protein Derivative]     Per [facility]    Chief Complaint  Patient presents with  . Medical Management of Chronic Issues    HPI:  Patient is a 68 y.o. female seen today at [facility] for routine follow up on chronic conditions. Pt has MR, seizures and CHF, dysphagia status post PEG tube, hypothyroidism, a fib, hyperlipidemia. In the last month has been seen due to occipital hematoma and is now s/p I&D. Nursing reports pt remains stable without any acute issues. Pt had workup on tachycardia in October and was started on metoprolol. HR improved but is now tachycardiac again. Today HR was 106.   Review of Systems:  Review of Systems  Unable to perform ROS: Patient nonverbal    Past Medical History  Diagnosis Date  . Seizures   . Osteoporosis   . Thyroid disease     hypothyroid  . High cholesterol   . Cataracts, bilateral   . Optic atrophy   . Paraparesis     mild  . Hypothyroidism   . Anginal pain   . Coronary artery disease    Past Surgical History  Procedure Laterality Date  . Midline incision    . Central venous catheter insertion  05/15/2014       . Esophagogastroduodenoscopy N/A 06/03/2014    Procedure: ESOPHAGOGASTRODUODENOSCOPY (EGD);  Surgeon: Beryle Beams, MD;  Location: [facility] ENDOSCOPY;  Service: Endoscopy;  Laterality: N/A;   Social History:   reports that she has never smoked. She has never used smokeless tobacco. She reports that she does not drink alcohol or use illicit drugs.  Family History  Problem Relation Age of Onset  . Colon cancer Mother   . Diabetes type II Sister     Medications: Patient's Medications  New Prescriptions   No medications on file    Previous Medications   ACETAMINOPHEN (TYLENOL) 160 MG/5ML SOLUTION    Place 20.3 mLs (650 mg total) into feeding tube every 6 (six) hours as needed for mild pain, headache or fever.   ALBUTEROL (PROVENTIL) (2.5 MG/3ML) 0.083% NEBULIZER SOLUTION    Take 3 mLs (2.5 mg total) by nebulization every 6 (six) hours as needed for wheezing or shortness of breath.   ASPIRIN EC 81 MG TABLET    Take 1 tablet (81 mg total) by mouth daily.   ATORVASTATIN (LIPITOR) 10 MG TABLET    Take 10 mg by mouth daily.   BISACODYL (BISAC-EVAC) 10 MG SUPPOSITORY    Place 10 mg rectally daily as needed for moderate constipation (in 24 hrs).   CALCIUM-VITAMIN D (OYSTER CALCIUM 500 + D) 500-200 MG-UNIT PER TABLET    Take 1 tablet by mouth daily.   DOCUSATE (COLACE) 50 MG/5ML LIQUID    Place 20 mLs (200 mg total) into feeding tube 2 (two) times daily.   LEVETIRACETAM (KEPPRA) 100 MG/ML SOLUTION    Place 5 mLs (500 mg total) into feeding tube daily at 6 (six) AM.   LEVETIRACETAM (KEPPRA) 100 MG/ML SOLUTION    750mg  every day at 1400hrs and 2000hrs   LEVOTHYROXINE (SYNTHROID, LEVOTHROID) 100 MCG TABLET    Take 100 mcg by mouth daily before breakfast.  MAGNESIUM HYDROXIDE (MILK OF MAGNESIA PO)    Take 30 mLs by mouth every three (3) days as needed (if no BM for 3 days.).   METOPROLOL TARTRATE (LOPRESSOR) 25 MG TABLET    Take 12.5 mg by mouth 2 (two) times daily.   NUTRITIONAL SUPPLEMENTS (FEEDING SUPPLEMENT, JEVITY 1.2 CAL,) LIQD    Place 1,000 mLs into feeding tube daily. At 60ml/hr   POLYETHYLENE GLYCOL (MIRALAX / GLYCOLAX) PACKET    Take 17 g by mouth daily.   POLYETHYLENE GLYCOL (MIRALAX / GLYCOLAX) PACKET    Take 17 g by mouth daily.   SODIUM PHOSPHATES (RA SALINE ENEMA RE)    Place 1 application rectally daily as needed (if not relieved by bisacodyl in 24 hrs).   VALPROIC ACID (DEPAKENE) 250 MG/5ML SYRP SYRUP    Place 15 mLs (750 mg total) into feeding tube 3 (three) times daily.   VITAMIN C (ASCORBIC ACID) 500 MG TABLET     Take 500 mg by mouth daily.    WATER FOR IRRIGATION, STERILE (FREE WATER) SOLN    Place 200 mLs into feeding tube every 6 (six) hours.  Modified Medications   No medications on file  Discontinued Medications   No medications on file     Physical Exam: Filed Vitals:   09/13/14 1049  BP: 115/74  Pulse: 106  Temp: 97.5 F (36.4 C)  Resp: 20    Physical Exam  Constitutional: She appears well-developed and well-nourished. No distress.  HENT:  Head: Normocephalic and atraumatic.  Mouth/Throat: Oropharynx is clear and moist. No oropharyngeal exudate.  Eyes: Conjunctivae are normal. Pupils are equal, round, and reactive to light.  Neck: Normal range of motion. Neck supple.  Cardiovascular: Regular rhythm and normal heart sounds.  Tachycardia present.   Pulmonary/Chest: Effort normal and breath sounds normal.  Abdominal: Soft. Bowel sounds are normal.  PEG intact   Musculoskeletal: She exhibits no edema or tenderness.  Neurological: She is alert. She exhibits abnormal muscle tone. Coordination abnormal.  continues with baseline facial droop contractures of lower extremities   Skin: Skin is warm and dry. She is not diaphoretic.    Labs reviewed: Basic Metabolic Panel:  Recent Labs  05/16/14 1503  05/17/14 0611  05/18/14 0342  06/06/14 0545 06/08/14 0715 06/09/14 0557 07/27/14  NA  --   < > 155*  < > 154*  < > 145 144 147 136*  K 2.5*  < > 3.8  < > 3.3*  < > 3.7 3.7 4.0 4.4  CL  --   < > 116*  < > 118*  < > 110 106 110  --   CO2  --   < > 30  < > 27  < > 30 29 29   --   GLUCOSE  --   < > 109*  < > 129*  < > 127* 105* 123*  --   BUN  --   < > 20  < > 18  < > 6 6 6 16   CREATININE  --   < > 1.08  < > 1.10  < > 0.56 0.46* 0.51 0.4*  CALCIUM  --   < > 8.0*  < > 7.4*  < > 7.8* 7.9* 8.0*  --   MG 2.0  --  1.8  --  1.8  --   --   --   --   --   < > = values in this interval not displayed. Liver Function Tests:  Recent  Labs  05/23/14 0300 05/30/14 0409 05/31/14 0520  AST  19 17 13   ALT 11 11 9   ALKPHOS 148* 258* 200*  BILITOT 0.2* 0.4 0.3  PROT 6.1 6.4 6.1  ALBUMIN 1.3* 1.3* 1.2*   No results for input(s): LIPASE, AMYLASE in the last 8760 hours. No results for input(s): AMMONIA in the last 8760 hours. CBC:  Recent Labs  05/31/14 0520  06/03/14 0500 06/04/14 0635 06/05/14 0449  06/06/14 0545 06/08/14 0715 07/27/14  WBC 19.3*  < > 19.5* 11.9* 8.2  --  9.4 9.7 12.2  NEUTROABS 12.9*  --  14.6* 8.4*  --   --   --   --   --   HGB 7.4*  < > 7.5* 7.1* 7.0*  < > 10.5* 10.3* 9.3*  HCT 23.1*  < > 22.2* 21.9* 20.8*  < > 30.5* 31.8* 32*  MCV 92.4  < > 91.4 95.2 91.6  --  87.1 91.6  --   PLT 244  < > 250 198 170  --  160 164 298  < > = values in this interval not displayed. TSH:  Recent Labs  12/16/13 0240 06/02/14 0614 07/19/14  TSH 2.495 12.960* 5.50   A1C: No results found for: HGBA1C Lipid Panel:  Recent Labs  07/27/14  CHOL 117  HDL 22*  LDLCALC 72  TRIG 116      Assessment/Plan 1. Paroxysmal a-fib -remains tachycardic, metoprolol was added last month, will increase metoprolol to 25 mg BID and cont to monitor HR  2. Diastolic heart failure, NYHA class 2 Stable at this time, no signs of fluid overload  3. Other specified hypothyroidism synthroid was increased last month, follow up TSH scheduled  4. Seizure disorder -without seizures noted, conts on keppra   5. MR (mental retardation) Unchanged   6. Anemia of chronic disease -hgb stable at this time, will follow up cbc in 1 month  7. Hematoma of right parietal scalp, subsequent encounter S/p I&D, has completed doxycyline  8. HLD (hyperlipidemia) LDL at goal, conts on lipitor

## 2014-10-19 LAB — BASIC METABOLIC PANEL
BUN: 26 mg/dL — AB (ref 4–21)
Glucose: 135 mg/dL
POTASSIUM: 4.2 mmol/L (ref 3.4–5.3)
Sodium: 138 mmol/L (ref 137–147)

## 2014-10-19 LAB — CBC AND DIFFERENTIAL: WBC: 20.8 10*3/mL

## 2014-10-21 ENCOUNTER — Non-Acute Institutional Stay (SKILLED_NURSING_FACILITY): Payer: Medicare Other | Admitting: Internal Medicine

## 2014-10-21 ENCOUNTER — Encounter: Payer: Self-pay | Admitting: Internal Medicine

## 2014-10-21 DIAGNOSIS — E038 Other specified hypothyroidism: Secondary | ICD-10-CM

## 2014-10-21 DIAGNOSIS — I5030 Unspecified diastolic (congestive) heart failure: Secondary | ICD-10-CM

## 2014-10-21 DIAGNOSIS — J189 Pneumonia, unspecified organism: Secondary | ICD-10-CM

## 2014-10-21 DIAGNOSIS — I4891 Unspecified atrial fibrillation: Secondary | ICD-10-CM

## 2014-10-21 DIAGNOSIS — G40909 Epilepsy, unspecified, not intractable, without status epilepticus: Secondary | ICD-10-CM

## 2014-10-21 DIAGNOSIS — D638 Anemia in other chronic diseases classified elsewhere: Secondary | ICD-10-CM

## 2014-10-21 DIAGNOSIS — F79 Unspecified intellectual disabilities: Secondary | ICD-10-CM

## 2014-10-21 NOTE — Progress Notes (Signed)
Patient ID: Dawn [name], female   DOB: 12-22-1945, 69 y.o.   MRN: [id number]   this is a routine visit.  Level care skilled.  [facility].  Chief complaint medical management of chronic issues including atrial fibrillation-diastolic CHF-mental retardation-.  Acute visit follow-up cough congestion Allergies  Allergen Reactions  . Carvedilol   . Ppd [Tuberculin Purified Protein Derivative]     Per [facility]              HPI:  Patient is a 69 y.o. female seen today at [facility] for routine follow up on chronic conditions. Pt has MR, seizures and CHF, dysphagia status post PEG tube, hypothyroidism, a fib, hyperlipidemia. Within the past couple months has been seen due to occipital hematoma and is now s/p I&D. Nursing reports pt remains stable without any acute issues today.  Earlier in the weekend been noted to have some cough and congestion chest x-ray showed some central pulmonary vascular congestion as well as a basilar findings that suggested atelectasis versus pneumonia.  She is on Atrovent nebulizers every 6 hours when necessary-also on a course of Augmentin.  She also is receiveda short course of Lasix for 3 days with potassium supplementation.  She is receiving Robitussin for cough.  Today appears to be at her baseline  --she appears to have somewhat of a chronically elevated white count most recently was up at 20.8-updated lab is pending  . Pt had workup on tachycardia in October and was started on metoprolol. Was recently increased-pulses are variable from the 90s I do see occasionally pulses about 100 but these do not appear be consistent.   Review of Systems:  Review of Systems  Unable to perform ROS: Patient nonverbal    Past Medical History  Diagnosis Date  . Seizures   . Osteoporosis   . Thyroid disease     hypothyroid  . High cholesterol   . Cataracts, bilateral   . Optic atrophy   . Paraparesis     mild  .  Hypothyroidism   . Anginal pain   . Coronary artery disease    Past Surgical History  Procedure Laterality Date  . Midline incision    . Central venous catheter insertion  05/15/2014       . Esophagogastroduodenoscopy N/A 06/03/2014    Procedure: ESOPHAGOGASTRODUODENOSCOPY (EGD);  Surgeon: Beryle Beams, MD;  Location: [facility] ENDOSCOPY;  Service: Endoscopy;  Laterality: N/A;   Social History:   reports that she has never smoked. She has never used smokeless tobacco. She reports that she does not drink alcohol or use illicit drugs.  Family History  Problem Relation Age of Onset  . Colon cancer Mother   . Diabetes type II Sister     Medications: Patient's Medications  New Prescriptions   No medications on file  Previous Medications   ACETAMINOPHEN (TYLENOL) 160 MG/5ML SOLUTION    Place 20.3 mLs (650 mg total) into feeding tube every 6 (six) hours as needed for mild pain, headache or fever.   ALBUTEROL (PROVENTIL) (2.5 MG/3ML) 0.083% NEBULIZER SOLUTION    Take 3 mLs (2.5 mg total) by nebulization every 6 (six) hours as needed for wheezing or shortness of breath.   ASPIRIN EC 81 MG TABLET    Take 1 tablet (81 mg total) by mouth daily.   ATORVASTATIN (LIPITOR) 10 MG TABLET    Take 10 mg by mouth daily.   BISACODYL (BISAC-EVAC) 10 MG SUPPOSITORY    Place 10  mg rectally daily as needed for moderate constipation (in 24 hrs).   CALCIUM-VITAMIN D (OYSTER CALCIUM 500 + D) 500-200 MG-UNIT PER TABLET    Take 1 tablet by mouth daily.   DOCUSATE (COLACE) 50 MG/5ML LIQUID    Place 20 mLs (200 mg total) into feeding tube 2 (two) times daily.   LEVETIRACETAM (KEPPRA) 100 MG/ML SOLUTION    Place 5 mLs (500 mg total) into feeding tube daily at 6 (six) AM.   LEVETIRACETAM (KEPPRA) 100 MG/ML SOLUTION    750mg  every day at 1400hrs and 2000hrs   LEVOTHYROXINE (SYNTHROID, LEVOTHROID) 100 MCG TABLET    Take 100 mcg by mouth daily before breakfast.   MAGNESIUM HYDROXIDE (MILK OF MAGNESIA PO)    Take 30 mLs by  mouth every three (3) days as needed (if no BM for 3 days.).   METOPROLOL TARTRATE (LOPRESSOR) 25 MG TABLET    Take 12.5 mg by mouth 2 (two) times daily.   NUTRITIONAL SUPPLEMENTS (FEEDING SUPPLEMENT, JEVITY 1.2 CAL,) LIQD    Place 1,000 mLs into feeding tube daily. At 42ml/hr   POLYETHYLENE GLYCOL (MIRALAX / GLYCOLAX) PACKET    Take 17 g by mouth daily.   POLYETHYLENE GLYCOL (MIRALAX / GLYCOLAX) PACKET    Take 17 g by mouth daily.   SODIUM PHOSPHATES (RA SALINE ENEMA RE)    Place 1 application rectally daily as needed (if not relieved by bisacodyl in 24 hrs).   VALPROIC ACID (DEPAKENE) 250 MG/5ML SYRP SYRUP    Place 15 mLs (750 mg total) into feeding tube 3 (three) times daily.   VITAMIN C (ASCORBIC ACID) 500 MG TABLET    Take 500 mg by mouth daily.    WATER FOR IRRIGATION, STERILE (FREE WATER) SOLN    Place 200 mLs into feeding tube every 6 (six) hours.  Modified Medications   No medications on file  Discontinued Medications   No medications on file         Physical Exam  Temperature 98.8 pulse 96 respirations 20 blood pressure 112/60  Constitutional: She appears well-developed and well-nourished. No distress sitting in her wheelchair comfortably.  HENT:  Head: Normocephalic and atraumatic.  Mouth/Throat: Oropharynx is clear and moist. No oropharyngeal exudate.  Eyes: Conjunctivae are normal. Pupils are equal, round, and reactive to light.  Neck: Normal range of motion. Neck supple.  Cardiovascular: Regular rhythm and normal heart sounds.  .   Pulmonary/Chest: Effort normal and breath sounds normal.-Poor respiratory effort there is no labored breathing  Abdominal: Soft. Bowel sounds are normal.  PEG intact   Musculoskeletal: She exhibits no edema or tenderness.  Neurological: She is alert. She exhibits abnormal muscle tone. Coordination abnormal.  continues with baseline facial droop contractures of lower extremities   Skin: Skin is warm and dry. She is not diaphoretic.     Labs reviewed:  General a fifth 2015.  WBC 20.8 hemoglobin 9.4 platelets 340.  Sodium 138 potassium 4.2 BUN 26 creatinine 0.6.   Basic Metabolic Panel:  Recent Labs  05/16/14 1503  05/17/14 0611  05/18/14 0342  06/06/14 0545 06/08/14 0715 06/09/14 0557 07/27/14  NA  --   < > 155*  < > 154*  < > 145 144 147 136*  K 2.5*  < > 3.8  < > 3.3*  < > 3.7 3.7 4.0 4.4  CL  --   < > 116*  < > 118*  < > 110 106 110  --   CO2  --   < >  30  < > 27  < > 30 29 29   --   GLUCOSE  --   < > 109*  < > 129*  < > 127* 105* 123*  --   BUN  --   < > 20  < > 18  < > 6 6 6 16   CREATININE  --   < > 1.08  < > 1.10  < > 0.56 0.46* 0.51 0.4*  CALCIUM  --   < > 8.0*  < > 7.4*  < > 7.8* 7.9* 8.0*  --   MG 2.0  --  1.8  --  1.8  --   --   --   --   --   < > = values in this interval not displayed. Liver Function Tests:  Recent Labs  05/23/14 0300 05/30/14 0409 05/31/14 0520  AST 19 17 13   ALT 11 11 9   ALKPHOS 148* 258* 200*  BILITOT 0.2* 0.4 0.3  PROT 6.1 6.4 6.1  ALBUMIN 1.3* 1.3* 1.2*   No results for input(s): LIPASE, AMYLASE in the last 8760 hours. No results for input(s): AMMONIA in the last 8760 hours. CBC:  Recent Labs  05/31/14 0520  06/03/14 0500 06/04/14 0635 06/05/14 0449  06/06/14 0545 06/08/14 0715 07/27/14  WBC 19.3*  < > 19.5* 11.9* 8.2  --  9.4 9.7 12.2  NEUTROABS 12.9*  --  14.6* 8.4*  --   --   --   --   --   HGB 7.4*  < > 7.5* 7.1* 7.0*  < > 10.5* 10.3* 9.3*  HCT 23.1*  < > 22.2* 21.9* 20.8*  < > 30.5* 31.8* 32*  MCV 92.4  < > 91.4 95.2 91.6  --  87.1 91.6  --   PLT 244  < > 250 198 170  --  160 164 298  < > = values in this interval not displayed. TSH:  Recent Labs  12/16/13 0240 06/02/14 0614 07/19/14  TSH 2.495 12.960* 5.50   A1C: No results found for: HGBA1C Lipid Panel:  Recent Labs  07/27/14  CHOL 117  HDL 22*  LDLCALC 72  TRIG 116      Assessment/Plan 1. Paroxysmal a-fib -At this point appears stable-recent increase in metoprolol to  25 mg BID  cont to monitor HR  2. Diastolic heart failure, NYHA class 2 Stable at this time, chest x-ray recently showed possible pulmonary vascular congestion she has finished a short course of Lasix-update BMP is pending  3. Other specified hypothyroidism synthroid was increased last month, follow up TSH scheduled  4. Seizure disorder -without seizures noted, conts on keppra   5. MR (mental retardation) Unchanged   6. Anemia of chronic disease -hgb stable at this time,-  7. Hematoma of right parietal scalp, subsequent encounter S/p I&D, has completed doxycyline  8. HLD (hyperlipidemia) LDL at goal, conts on lipitor  #9-history cough congestion-possible pneumonia-she is completing a course of Augmentin also has Atrovent nebulizers when necessary and Robitussin-this appears to be stable clinically.  #10-history of leukocytosis-again she is on Augmentin for suspected URI-she has somewhat of a mildly chronic elevation of her white count-patient's clinical status--comorbidities would preclude aggressive workup --.  An updated lab is pending.  [id number]

## 2014-10-26 ENCOUNTER — Inpatient Hospital Stay (HOSPITAL_COMMUNITY)
Admission: EM | Admit: 2014-10-26 | Discharge: 2014-11-02 | DRG: 871 | Disposition: A | Discharge status: Other Acute Inpatient Hospital | Payer: Medicare Other | Attending: Internal Medicine | Admitting: Internal Medicine

## 2014-10-26 ENCOUNTER — Inpatient Hospital Stay (HOSPITAL_COMMUNITY): Payer: Medicare Other

## 2014-10-26 ENCOUNTER — Emergency Department (HOSPITAL_COMMUNITY): Payer: Medicare Other

## 2014-10-26 ENCOUNTER — Non-Acute Institutional Stay (SKILLED_NURSING_FACILITY): Payer: Medicare Other | Admitting: Internal Medicine

## 2014-10-26 ENCOUNTER — Encounter (HOSPITAL_COMMUNITY): Payer: Self-pay | Admitting: Emergency Medicine

## 2014-10-26 DIAGNOSIS — J189 Pneumonia, unspecified organism: Secondary | ICD-10-CM

## 2014-10-26 DIAGNOSIS — K5900 Constipation, unspecified: Secondary | ICD-10-CM | POA: Diagnosis present

## 2014-10-26 DIAGNOSIS — Z66 Do not resuscitate: Secondary | ICD-10-CM | POA: Diagnosis present

## 2014-10-26 DIAGNOSIS — I480 Paroxysmal atrial fibrillation: Secondary | ICD-10-CM | POA: Diagnosis present

## 2014-10-26 DIAGNOSIS — D638 Anemia in other chronic diseases classified elsewhere: Secondary | ICD-10-CM | POA: Diagnosis present

## 2014-10-26 DIAGNOSIS — R451 Restlessness and agitation: Secondary | ICD-10-CM | POA: Diagnosis present

## 2014-10-26 DIAGNOSIS — E039 Hypothyroidism, unspecified: Secondary | ICD-10-CM | POA: Diagnosis present

## 2014-10-26 DIAGNOSIS — G40909 Epilepsy, unspecified, not intractable, without status epilepticus: Secondary | ICD-10-CM

## 2014-10-26 DIAGNOSIS — R0902 Hypoxemia: Secondary | ICD-10-CM

## 2014-10-26 DIAGNOSIS — R4702 Dysphasia: Secondary | ICD-10-CM | POA: Diagnosis present

## 2014-10-26 DIAGNOSIS — Z931 Gastrostomy status: Secondary | ICD-10-CM | POA: Diagnosis not present

## 2014-10-26 DIAGNOSIS — Z7951 Long term (current) use of inhaled steroids: Secondary | ICD-10-CM | POA: Diagnosis not present

## 2014-10-26 DIAGNOSIS — Z993 Dependence on wheelchair: Secondary | ICD-10-CM

## 2014-10-26 DIAGNOSIS — A419 Sepsis, unspecified organism: Principal | ICD-10-CM

## 2014-10-26 DIAGNOSIS — R0609 Other forms of dyspnea: Secondary | ICD-10-CM | POA: Diagnosis present

## 2014-10-26 DIAGNOSIS — Z7982 Long term (current) use of aspirin: Secondary | ICD-10-CM

## 2014-10-26 DIAGNOSIS — F79 Unspecified intellectual disabilities: Secondary | ICD-10-CM | POA: Diagnosis present

## 2014-10-26 DIAGNOSIS — I5030 Unspecified diastolic (congestive) heart failure: Secondary | ICD-10-CM | POA: Diagnosis present

## 2014-10-26 DIAGNOSIS — I4729 Other ventricular tachycardia: Secondary | ICD-10-CM

## 2014-10-26 DIAGNOSIS — G8220 Paraplegia, unspecified: Secondary | ICD-10-CM | POA: Diagnosis present

## 2014-10-26 DIAGNOSIS — Y95 Nosocomial condition: Secondary | ICD-10-CM | POA: Diagnosis present

## 2014-10-26 DIAGNOSIS — R4182 Altered mental status, unspecified: Secondary | ICD-10-CM

## 2014-10-26 DIAGNOSIS — I4891 Unspecified atrial fibrillation: Secondary | ICD-10-CM

## 2014-10-26 DIAGNOSIS — I472 Ventricular tachycardia: Secondary | ICD-10-CM

## 2014-10-26 DIAGNOSIS — R1911 Absent bowel sounds: Secondary | ICD-10-CM | POA: Diagnosis present

## 2014-10-26 DIAGNOSIS — R140 Abdominal distension (gaseous): Secondary | ICD-10-CM

## 2014-10-26 DIAGNOSIS — I2510 Atherosclerotic heart disease of native coronary artery without angina pectoris: Secondary | ICD-10-CM | POA: Diagnosis present

## 2014-10-26 DIAGNOSIS — R109 Unspecified abdominal pain: Secondary | ICD-10-CM

## 2014-10-26 LAB — CBC WITH DIFFERENTIAL/PLATELET
BASOS ABS: 0 10*3/uL (ref 0.0–0.1)
Basophils Relative: 0 % (ref 0–1)
EOS ABS: 0 10*3/uL (ref 0.0–0.7)
EOS PCT: 0 % (ref 0–5)
HCT: 28.9 % — ABNORMAL LOW (ref 36.0–46.0)
Hemoglobin: 9 g/dL — ABNORMAL LOW (ref 12.0–15.0)
LYMPHS ABS: 1.5 10*3/uL (ref 0.7–4.0)
LYMPHS PCT: 8 % — AB (ref 12–46)
MCH: 29.4 pg (ref 26.0–34.0)
MCHC: 31.1 g/dL (ref 30.0–36.0)
MCV: 94.4 fL (ref 78.0–100.0)
Monocytes Absolute: 1.9 10*3/uL — ABNORMAL HIGH (ref 0.1–1.0)
Monocytes Relative: 10 % (ref 3–12)
NEUTROS PCT: 82 % — AB (ref 43–77)
Neutro Abs: 15.5 10*3/uL — ABNORMAL HIGH (ref 1.7–7.7)
PLATELETS: 460 10*3/uL — AB (ref 150–400)
RBC: 3.06 MIL/uL — ABNORMAL LOW (ref 3.87–5.11)
RDW: 16.9 % — ABNORMAL HIGH (ref 11.5–15.5)
WBC: 18.9 10*3/uL — ABNORMAL HIGH (ref 4.0–10.5)

## 2014-10-26 LAB — URINALYSIS, ROUTINE W REFLEX MICROSCOPIC
Bilirubin Urine: NEGATIVE
GLUCOSE, UA: NEGATIVE mg/dL
HGB URINE DIPSTICK: NEGATIVE
Ketones, ur: NEGATIVE mg/dL
Nitrite: NEGATIVE
PH: 8 (ref 5.0–8.0)
Protein, ur: NEGATIVE mg/dL
Specific Gravity, Urine: 1.022 (ref 1.005–1.030)
Urobilinogen, UA: 0.2 mg/dL (ref 0.0–1.0)

## 2014-10-26 LAB — I-STAT TROPONIN, ED: TROPONIN I, POC: 0.01 ng/mL (ref 0.00–0.08)

## 2014-10-26 LAB — PROTIME-INR
INR: 1.14 (ref 0.00–1.49)
PROTHROMBIN TIME: 14.8 s (ref 11.6–15.2)

## 2014-10-26 LAB — URINE MICROSCOPIC-ADD ON

## 2014-10-26 LAB — COMPREHENSIVE METABOLIC PANEL
ALBUMIN: 2.5 g/dL — AB (ref 3.5–5.2)
ALK PHOS: 100 U/L (ref 39–117)
ALT: 12 U/L (ref 0–35)
ANION GAP: 9 (ref 5–15)
AST: 22 U/L (ref 0–37)
BILIRUBIN TOTAL: 0.5 mg/dL (ref 0.3–1.2)
BUN: 17 mg/dL (ref 6–23)
CO2: 26 mmol/L (ref 19–32)
CREATININE: 0.54 mg/dL (ref 0.50–1.10)
Calcium: 8.4 mg/dL (ref 8.4–10.5)
Chloride: 103 mEq/L (ref 96–112)
GFR calc non Af Amer: >90 mL/min (ref >90)
GLUCOSE: 88 mg/dL (ref 70–99)
POTASSIUM: 5.1 mmol/L (ref 3.5–5.1)
Sodium: 138 mmol/L (ref 135–145)
Total Protein: 8.2 g/dL (ref 6.0–8.3)

## 2014-10-26 LAB — I-STAT CG4 LACTIC ACID, ED
LACTIC ACID, VENOUS: 2.34 mmol/L — AB (ref 0.5–2.2)
LACTIC ACID, VENOUS: 1.95 mmol/L (ref 0.5–2.2)

## 2014-10-26 LAB — PROCALCITONIN: Procalcitonin: 0.6 ng/mL

## 2014-10-26 MED ORDER — ALBUTEROL SULFATE (2.5 MG/3ML) 0.083% IN NEBU
5.0000 mg | INHALATION_SOLUTION | Freq: Once | RESPIRATORY_TRACT | Status: AC
  Administered 2014-10-26: 5 mg via RESPIRATORY_TRACT
  Filled 2014-10-26: qty 6

## 2014-10-26 MED ORDER — LEVETIRACETAM 100 MG/ML PO SOLN
500.0000 mg | Freq: Every day | ORAL | Status: DC
  Administered 2014-10-27 – 2014-11-02 (×7): 500 mg
  Filled 2014-10-26 (×9): qty 5

## 2014-10-26 MED ORDER — METOPROLOL TARTRATE 12.5 MG HALF TABLET
12.5000 mg | ORAL_TABLET | Freq: Three times a day (TID) | ORAL | Status: DC
  Administered 2014-10-27 – 2014-11-02 (×18): 12.5 mg
  Filled 2014-10-26 (×22): qty 1

## 2014-10-26 MED ORDER — METOPROLOL TARTRATE 25 MG PO TABS
12.5000 mg | ORAL_TABLET | Freq: Four times a day (QID) | ORAL | Status: DC
  Administered 2014-10-26: 12.5 mg
  Filled 2014-10-26: qty 1

## 2014-10-26 MED ORDER — BISACODYL 10 MG RE SUPP: 10.0000 mg | Freq: Every day | RECTAL | Status: DC | PRN

## 2014-10-26 MED ORDER — ALBUTEROL SULFATE (2.5 MG/3ML) 0.083% IN NEBU: 2.5000 mg | INHALATION_SOLUTION | Freq: Four times a day (QID) | RESPIRATORY_TRACT | Status: DC | PRN

## 2014-10-26 MED ORDER — PIPERACILLIN-TAZOBACTAM 3.375 G IVPB 30 MIN
3.3750 g | Freq: Once | INTRAVENOUS | Status: AC
  Administered 2014-10-26: 3.375 g via INTRAVENOUS
  Filled 2014-10-26: qty 50

## 2014-10-26 MED ORDER — HEPARIN SODIUM (PORCINE) 5000 UNIT/ML IJ SOLN
5000.0000 [IU] | Freq: Three times a day (TID) | INTRAMUSCULAR | Status: DC
  Administered 2014-10-26 – 2014-11-02 (×21): 5000 [IU] via SUBCUTANEOUS
  Filled 2014-10-26 (×24): qty 1

## 2014-10-26 MED ORDER — DOCUSATE SODIUM 50 MG/5ML PO LIQD
200.0000 mg | Freq: Two times a day (BID) | ORAL | Status: DC
  Administered 2014-10-27 – 2014-11-01 (×10): 200 mg
  Filled 2014-10-26 (×16): qty 20

## 2014-10-26 MED ORDER — SODIUM CHLORIDE 0.9 % IV BOLUS (SEPSIS)
1000.0000 mL | INTRAVENOUS | Status: AC
  Administered 2014-10-26: 1000 mL via INTRAVENOUS

## 2014-10-26 MED ORDER — HALOPERIDOL LACTATE 5 MG/ML IJ SOLN
2.0000 mg | Freq: Once | INTRAMUSCULAR | Status: AC
  Administered 2014-10-26: 2 mg via INTRAVENOUS
  Filled 2014-10-26: qty 1

## 2014-10-26 MED ORDER — LEVETIRACETAM 100 MG/ML PO SOLN
750.0000 mg | ORAL | Status: DC
  Administered 2014-10-26 – 2014-11-02 (×14): 750 mg
  Filled 2014-10-26 (×17): qty 7.5

## 2014-10-26 MED ORDER — IPRATROPIUM BROMIDE 0.02 % IN SOLN: 0.5000 mg | Freq: Four times a day (QID) | RESPIRATORY_TRACT | Status: DC | PRN

## 2014-10-26 MED ORDER — LEVOTHYROXINE SODIUM 100 MCG PO TABS
100.0000 ug | ORAL_TABLET | Freq: Every day | ORAL | Status: DC
  Administered 2014-10-27 – 2014-11-02 (×7): 100 ug
  Filled 2014-10-26 (×10): qty 1

## 2014-10-26 MED ORDER — VANCOMYCIN HCL IN DEXTROSE 1-5 GM/200ML-% IV SOLN
1000.0000 mg | Freq: Once | INTRAVENOUS | Status: AC
  Administered 2014-10-26: 1000 mg via INTRAVENOUS
  Filled 2014-10-26: qty 200

## 2014-10-26 MED ORDER — ACETAMINOPHEN 160 MG/5ML PO SOLN
650.0000 mg | Freq: Four times a day (QID) | ORAL | Status: DC | PRN
  Administered 2014-10-26 – 2014-11-01 (×4): 650 mg
  Filled 2014-10-26 (×4): qty 20.3

## 2014-10-26 MED ORDER — METOPROLOL TARTRATE 12.5 MG HALF TABLET
12.5000 mg | ORAL_TABLET | Freq: Three times a day (TID) | ORAL | Status: DC
  Filled 2014-10-26: qty 1

## 2014-10-26 MED ORDER — VALPROIC ACID 250 MG/5ML PO SYRP
750.0000 mg | ORAL_SOLUTION | Freq: Three times a day (TID) | ORAL | Status: DC
  Administered 2014-10-26 – 2014-11-02 (×20): 750 mg
  Filled 2014-10-26 (×23): qty 15

## 2014-10-26 MED ORDER — PIPERACILLIN-TAZOBACTAM 3.375 G IVPB
3.3750 g | Freq: Three times a day (TID) | INTRAVENOUS | Status: DC
  Administered 2014-10-27 – 2014-11-02 (×20): 3.375 g via INTRAVENOUS
  Filled 2014-10-26 (×24): qty 50

## 2014-10-26 MED ORDER — LORAZEPAM 2 MG/ML IJ SOLN
0.5000 mg | Freq: Once | INTRAMUSCULAR | Status: AC
  Administered 2014-10-26: 0.5 mg via INTRAVENOUS
  Filled 2014-10-26: qty 1

## 2014-10-26 MED ORDER — VANCOMYCIN HCL 500 MG IV SOLR
500.0000 mg | Freq: Two times a day (BID) | INTRAVENOUS | Status: DC
  Administered 2014-10-27 – 2014-10-30 (×7): 500 mg via INTRAVENOUS
  Filled 2014-10-26 (×13): qty 500

## 2014-10-26 MED ORDER — FREE WATER
200.0000 mL | Freq: Two times a day (BID) | Status: DC
  Administered 2014-10-27 (×2): 200 mL

## 2014-10-26 MED ORDER — POLYETHYLENE GLYCOL 3350 17 G PO PACK
17.0000 g | PACK | Freq: Every day | ORAL | Status: DC
  Administered 2014-10-27 – 2014-11-01 (×5): 17 g
  Filled 2014-10-26 (×8): qty 1

## 2014-10-26 NOTE — Progress Notes (Signed)
ANTIBIOTIC CONSULT NOTE - INITIAL  Pharmacy Consult for Vancomycin & Zosyn Indication: Sepsis  Allergies  Allergen Reactions  . Carvedilol   . Ppd [Tuberculin Purified Protein Derivative]     Per [facility]    Patient Measurements: Weight: 144 lb (65.318 kg) Height : 62 inches  Vital Signs: Temp: 103.6 F (39.8 C) (01/12 1609) Temp Source: Rectal (01/12 1609) BP: 131/78 mmHg (01/12 1710) Pulse Rate: 122 (01/12 1710) Intake/Output from previous day:   Intake/Output from this shift:    Labs:  Recent Labs  10/26/14 1645  WBC 18.9*  HGB 9.0*  PLT 460*  CREATININE 0.54   Estimated Creatinine Clearance: 59.4 mL/min (by C-G formula based on Cr of 0.54). No results for input(s): VANCOTROUGH, VANCOPEAK, VANCORANDOM, GENTTROUGH, GENTPEAK, GENTRANDOM, TOBRATROUGH, TOBRAPEAK, TOBRARND, AMIKACINPEAK, AMIKACINTROU, AMIKACIN in the last 72 hours.   Microbiology: No results found for this or any previous visit (from the past 720 hour(s)).  Medical History: Past Medical History  Diagnosis Date  . Seizures   . Osteoporosis   . Thyroid disease     hypothyroid  . High cholesterol   . Cataracts, bilateral   . Optic atrophy   . Paraparesis     mild  . Hypothyroidism   . Anginal pain   . Coronary artery disease     Medications:  Scheduled:   Infusions:  . [START ON 10/27/2014] piperacillin-tazobactam (ZOSYN)  IV    . sodium chloride 1,000 mL (10/26/14 1652)  . [START ON 10/27/2014] vancomycin     Assessment:  69 yr female with paraplegia brought to ED with fever, elevated heart rate, cough  Patient was on Augmentin PTA  Chest Xray ordered  Blood and urine cultures ordered  Vancomycin 1gm given in ED @ 17:12, Zosyn 3.375gm x 1 given in ED @ 17:02  Pharmacy consulted to dose Vancomycin and Zosyn for sepsis  CrCl ~ 59 ml/min  Goal of Therapy:  Vancomycin trough level 15-20 mcg/ml  Plan:  Measure antibiotic drug levels at steady state Follow up culture results    Zosyn 3.375gm IV q8h (each dose infused over 4 hr)  Vancomycin 500mg  IV q12h  Everette Rank, PharmD 10/26/2014,6:16 PM

## 2014-10-26 NOTE — ED Notes (Signed)
Per EMS, pt from [facility] with Hx of MR and paraplegia, tympanic temperature of 103, sent to ED for x-ray for fever, tacchycardia HR 133.Marland Kitchen Per EMS pt is at mental baseline, pt was recently seen and given chest xray for cough.

## 2014-10-26 NOTE — ED Notes (Signed)
Patient repositioned in bed, changed in to gown. Resting at this time. Rise and fall of chest observed. Patient remains on monitor.

## 2014-10-26 NOTE — ED Provider Notes (Signed)
CSN: [id number]     Arrival date & time 10/26/14  1602 History   First MD Initiated Contact with Patient 10/26/14 1610     No chief complaint on file.    (Consider location/radiation/quality/duration/timing/severity/associated sxs/prior Treatment) HPI Comments: I discussed the case with the patient's family who is here at the bedside and also Lauren who is a Marine scientist and familiar with the patient's care at [facility] facility. Lauren states that the patient has had a cough and has been treated with Augmentin for the last 7 days. She states that the patient was on a ten-day course and was due to finish the Augmentin in 2 days. She notes that this evening the patient was becoming more tachypneic and agitated. She was sent to the ER for this reason. Her symptoms are constant in nature. The severity is moderate. She has had similar symptoms previously. Nothing has relieved her symptoms thus far.  The history is provided by a caregiver.    Past Medical History  Diagnosis Date  . Seizures   . Osteoporosis   . Thyroid disease     hypothyroid  . High cholesterol   . Cataracts, bilateral   . Optic atrophy   . Paraparesis     mild  . Hypothyroidism   . Anginal pain   . Coronary artery disease    Past Surgical History  Procedure Laterality Date  . Midline incision    . Central venous catheter insertion  05/15/2014       . Esophagogastroduodenoscopy N/A 06/03/2014    Procedure: ESOPHAGOGASTRODUODENOSCOPY (EGD);  Surgeon: Beryle Beams, MD;  Location: [facility] ENDOSCOPY;  Service: Endoscopy;  Laterality: N/A;   Family History  Problem Relation Age of Onset  . Colon cancer Mother   . Diabetes type II Sister    History  Substance Use Topics  . Smoking status: Never Smoker   . Smokeless tobacco: Never Used  . Alcohol Use: No   OB History    Gravida Para Term Preterm AB TAB SAB Ectopic Multiple Living   0              Review of Systems  Constitutional: Positive for fever. Negative for  fatigue.  HENT: Negative for congestion and drooling.   Eyes: Negative for pain.  Respiratory: Positive for cough and shortness of breath.   Cardiovascular: Negative for chest pain.  Gastrointestinal: Negative for nausea, vomiting, abdominal pain and diarrhea.  Genitourinary: Negative for dysuria and hematuria.  Musculoskeletal: Negative for back pain, gait problem and neck pain.  Skin: Negative for color change.  Neurological: Negative for dizziness and headaches.  Hematological: Negative for adenopathy.  Psychiatric/Behavioral: Negative for behavioral problems.  All other systems reviewed and are negative.     Allergies  Carvedilol and Ppd  Home Medications   Prior to Admission medications   Medication Sig Start Date End Date Taking? Authorizing Provider  acetaminophen (TYLENOL) 160 MG/5ML solution Place 20.3 mLs (650 mg total) into feeding tube every 6 (six) hours as needed for mild pain, headache or fever. 06/09/14   Bonnielee Haff, MD  albuterol (PROVENTIL) (2.5 MG/3ML) 0.083% nebulizer solution Take 3 mLs (2.5 mg total) by nebulization every 6 (six) hours as needed for wheezing or shortness of breath. 06/09/14   Bonnielee Haff, MD  aspirin EC 81 MG tablet Take 1 tablet (81 mg total) by mouth daily. 06/09/14   Bonnielee Haff, MD  atorvastatin (LIPITOR) 10 MG tablet Take 10 mg by mouth daily.    Historical  Provider, MD  bisacodyl (BISAC-EVAC) 10 MG suppository Place 10 mg rectally daily as needed for moderate constipation (in 24 hrs).    Historical Provider, MD  calcium-vitamin D (OYSTER CALCIUM 500 + D) 500-200 MG-UNIT per tablet Take 1 tablet by mouth daily.    Historical Provider, MD  docusate (COLACE) 50 MG/5ML liquid Place 20 mLs (200 mg total) into feeding tube 2 (two) times daily. 06/09/14   Bonnielee Haff, MD  levETIRAcetam (KEPPRA) 100 MG/ML solution Place 5 mLs (500 mg total) into feeding tube daily at 6 (six) AM. 06/09/14   Bonnielee Haff, MD  levETIRAcetam (KEPPRA) 100  MG/ML solution 750mg  every day at 1400hrs and 2000hrs 06/09/14   Bonnielee Haff, MD  levothyroxine (SYNTHROID, LEVOTHROID) 100 MCG tablet Take 100 mcg by mouth daily before breakfast.    Historical Provider, MD  Magnesium Hydroxide (MILK OF MAGNESIA PO) Take 30 mLs by mouth every three (3) days as needed (if no BM for 3 days.).    Historical Provider, MD  metoprolol tartrate (LOPRESSOR) 25 MG tablet Take 12.5 mg by mouth 2 (two) times daily.    Historical Provider, MD  Nutritional Supplements (FEEDING SUPPLEMENT, JEVITY 1.2 CAL,) LIQD Place 1,000 mLs into feeding tube daily. At 59ml/hr 06/09/14   Bonnielee Haff, MD  polyethylene glycol (MIRALAX / Floria Raveling) packet Take 17 g by mouth daily.    Historical Provider, MD  polyethylene glycol (MIRALAX / GLYCOLAX) packet Take 17 g by mouth daily. 08/03/14   Wandra Arthurs, MD  Sodium Phosphates (RA SALINE ENEMA RE) Place 1 application rectally daily as needed (if not relieved by bisacodyl in 24 hrs).    Historical Provider, MD  Valproic Acid (DEPAKENE) 250 MG/5ML SYRP syrup Place 15 mLs (750 mg total) into feeding tube 3 (three) times daily. 06/09/14   Bonnielee Haff, MD  vitamin C (ASCORBIC ACID) 500 MG tablet Take 500 mg by mouth daily.     Historical Provider, MD  Water For Irrigation, Sterile (FREE WATER) SOLN Place 200 mLs into feeding tube every 6 (six) hours. 06/09/14   Bonnielee Haff, MD   BP 115/69 mmHg  Pulse 125  Temp(Src) 103.6 F (39.8 C) (Rectal)  Resp 16  SpO2 98% Physical Exam  Constitutional: She appears well-developed and well-nourished.  HENT:  Head: Normocephalic.  Mouth/Throat: No oropharyngeal exudate.  Dry oral mucous membranes.  Eyes: Conjunctivae and EOM are normal. Pupils are equal, round, and reactive to light.  Neck: Normal range of motion. Neck supple.  Cardiovascular: Regular rhythm, normal heart sounds and intact distal pulses.  Exam reveals no gallop and no friction rub.   No murmur heard. Sinus tachycardia 125   Pulmonary/Chest: She is in respiratory distress.  tachypnea  Abdominal: Soft. Bowel sounds are normal. There is no tenderness. There is no rebound and no guarding.  Musculoskeletal: Normal range of motion. She exhibits no edema or tenderness.  Normal-appearing back and sacrum without obvious abnormality.  Neurological: She is alert.  Skin: Skin is warm and dry.  Nursing note and vitals reviewed.   ED Course  Procedures (including critical care time) Labs Review Labs Reviewed  CBC WITH DIFFERENTIAL - Abnormal; Notable for the following:    WBC 18.9 (*)    RBC 3.06 (*)    Hemoglobin 9.0 (*)    HCT 28.9 (*)    RDW 16.9 (*)    Platelets 460 (*)    Neutrophils Relative % 82 (*)    Neutro Abs 15.5 (*)    Lymphocytes Relative  8 (*)    Monocytes Absolute 1.9 (*)    All other components within normal limits  COMPREHENSIVE METABOLIC PANEL - Abnormal; Notable for the following:    Albumin 2.5 (*)    All other components within normal limits  URINALYSIS, ROUTINE W REFLEX MICROSCOPIC - Abnormal; Notable for the following:    Leukocytes, UA SMALL (*)    All other components within normal limits  BRAIN NATRIURETIC PEPTIDE - Abnormal; Notable for the following:    B Natriuretic Peptide 257.3 (*)    All other components within normal limits  I-STAT CG4 LACTIC ACID, ED - Abnormal; Notable for the following:    Lactic Acid, Venous 2.34 (*)    All other components within normal limits  MRSA PCR SCREENING  CULTURE, BLOOD (ROUTINE X 2)  CULTURE, BLOOD (ROUTINE X 2)  URINE CULTURE  CULTURE, EXPECTORATED SPUTUM-ASSESSMENT  GRAM STAIN  PROCALCITONIN  PROTIME-INR  URINE MICROSCOPIC-ADD ON  LEGIONELLA ANTIGEN, URINE  STREP PNEUMONIAE URINARY ANTIGEN  GLUCOSE, CAPILLARY  COMPREHENSIVE METABOLIC PANEL  CBC WITH DIFFERENTIAL  I-STAT TROPOININ, ED  I-STAT CG4 LACTIC ACID, ED    Imaging Review Dg Chest Port 1 View  10/26/2014   CLINICAL DATA:  Shortness of breath, cough, fever.  EXAM:  PORTABLE CHEST - 1 VIEW  COMPARISON:  August 03, 2014.  FINDINGS: Stable cardiomediastinal silhouette. Hypoinflation of the lungs is noted. Mild central pulmonary vascular congestion is noted. Moderate right basilar opacity is noted consistent with pneumonia or atelectasis with associated pleural effusion. Patient is rotated to the right.  IMPRESSION: Mild central pulmonary vascular congestion. Moderate right basilar opacity consistent with pneumonia or atelectasis with associated pleural effusion.   Electronically Signed   By: Sabino Dick M.D.   On: 10/26/2014 18:51   Dg Abd Portable 2v  10/26/2014   CLINICAL DATA:  Abdominal discomfort, absent bowel sounds. History paraplegia. Fever intact cardiac  EXAM: PORTABLE ABDOMEN - 2 VIEW  COMPARISON:  Chest radiograph earlier this day.  FINDINGS: Gastrostomy tube in the upper abdomen. There are surgical clips in the right upper quadrant. No dilated small bowel loops to suggest obstruction. There is stool throughout the ascending, transverse, and sigmoid colon. There is no free air. Lower pelvis not included in the field of view. Right pleural effusion is noted.  IMPRESSION: Nonobstructive bowel gas pattern.  No free air.   Electronically Signed   By: Jeb Levering M.D.   On: 10/26/2014 21:34     EKG Interpretation   Date/Time:  Tuesday October 26 2014 16:10:00 EST Ventricular Rate:  126 PR Interval:  151 QRS Duration: 67 QT Interval:  299 QTC Calculation: 433 R Axis:   85 Text Interpretation:  Sinus tachycardia Low voltage, extremity and  precordial leads Nonspecific ST depression No significant change since  last tracing Confirmed by Emmalin Jaquess  MD, Janthony Holleman ([id number]) on 10/26/2014  4:31:36 PM     CRITICAL CARE Performed by: Pamella Pert, S Total critical care time: 30 min Critical care time was exclusive of separately billable procedures and treating other patients. Critical care was necessary to treat or prevent imminent or life-threatening  deterioration. Critical care was time spent personally by me on the following activities: development of treatment plan with patient and/or surrogate as well as nursing, discussions with consultants, evaluation of patient's response to treatment, examination of patient, obtaining history from patient or surrogate, ordering and performing treatments and interventions, ordering and review of laboratory studies, ordering and review of radiographic studies, pulse oximetry and re-evaluation  of patient's condition.  MDM   Final diagnoses:  Sepsis, due to unspecified organism  HCAP (healthcare-associated pneumonia)    4:32 PM 69 y.o. female w hx of seizure, CAD, Seizures, mental retardation who is wheelchair bound here with fever. Per EMS, pt from [facility] with Hx of MR and paraplegia, tympanic temperature of 103, sent to ED for x-ray for fever, tacchycardia HR 133. Per EMS pt is at mental baseline. Family members reportedly in route. The patient was admitted several months ago for sepsis. She is febrile, tachycardic to 125, tachypneic, hypoxic to 88% on room air but stable on 2 L via nasal cannula. Blood pressure is currently stable. Will start sepsis protocol.  5:58 PM Discussed case with Ander Purpura at [facility] facility.   Hospitalist to admit.   Critical care documented in this pt w/ pneumonia and RR >30, HR >120, and sepsis.   Pamella Pert, MD 10/27/14 1109

## 2014-10-26 NOTE — ED Notes (Signed)
Bed: NH65 Expected date:  Expected time:  Means of arrival:  Comments: Ems- elderly, tachy

## 2014-10-26 NOTE — H&P (Signed)
[facility] History and Physical  Patient: Dawn [name]  VZC:[id number]  DOB: 02-04-1946  DOS: the patient was seen and examined on 10/26/2014 PCP: [name]  Chief Complaint: Shortness of breath and cough  HPI: Dawn [name] is a 69 y.o. 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 The patient presents with complaints of shortness of breath and cough. History was obtained from family member, ED documentation. Patient is a resident of [facility], 1 10/21/2014 she had complaints of cough and congestion and with leukocytosis she was treated with Augmentin for possible pneumonia as well as nebulization and Robitussin. She completed the course but continues to have progressively worsening cough. She was also becoming agitated with fever and tachycardia. At her baseline the patient is nonverbal and does not follow, arms. There was no recent fall reported. As per the family member patient has been having increasing diarrhea over last few days. Patient is receiving Jevity 4 times a day via PEG tube. Patient does not have a Foley catheter placed in the nursing home.  The patient is coming from SNF. And at her baseline dependent for most of her ADL.  Review of Systems: as mentioned in the history of present illness.  A Comprehensive review of the other systems is negative.  Past Medical History  Diagnosis Date  . Seizures   . Osteoporosis   . Thyroid disease     hypothyroid  . High cholesterol   . Cataracts, bilateral   . Optic atrophy   . Paraparesis     mild  . Hypothyroidism   . Anginal pain   . Coronary artery disease    Past Surgical History  Procedure Laterality Date  . Midline incision    . Central venous catheter insertion  05/15/2014       . Esophagogastroduodenoscopy N/A 06/03/2014    Procedure: ESOPHAGOGASTRODUODENOSCOPY (EGD);   Surgeon: Beryle Beams, MD;  Location: [facility] ENDOSCOPY;  Service: Endoscopy;  Laterality: N/A;   Social History:  reports that she has never smoked. She has never used smokeless tobacco. She reports that she does not drink alcohol or use illicit drugs.  Allergies  Allergen Reactions  . Carvedilol   . Ppd [Tuberculin Purified Protein Derivative]     Per MAR    Family History  Problem Relation Age of Onset  . Colon cancer Mother   . Diabetes type II Sister     Prior to Admission medications   Medication Sig Start Date End Date Taking? Authorizing Provider  acetaminophen (TYLENOL) 160 MG/5ML solution Place 20.3 mLs (650 mg total) into feeding tube every 6 (six) hours as needed for mild pain, headache or fever. 06/09/14  Yes Bonnielee Haff, MD  albuterol (PROVENTIL) (2.5 MG/3ML) 0.083% nebulizer solution Take 3 mLs (2.5 mg total) by nebulization every 6 (six) hours as needed for wheezing or shortness of breath. 06/09/14  Yes Bonnielee Haff, MD  amoxicillin-clavulanate (AUGMENTIN) 500-125 MG per tablet Give 1 tablet by tube 2 (two) times daily. 10/18/14 10/28/14 Yes Historical Provider, MD  aspirin EC 81 MG tablet Take 1 tablet (81 mg total) by mouth daily. 06/09/14  Yes Bonnielee Haff, MD  atorvastatin (LIPITOR) 10 MG tablet 10 mg by PEG Tube route daily.   Yes Historical Provider, MD  bisacodyl (BISAC-EVAC) 10 MG suppository Place 10 mg rectally daily as needed for moderate constipation (in 24 hrs).   Yes Historical Provider, MD  calcium-vitamin D (OYSTER CALCIUM 500 + D) 500-200 MG-UNIT per tablet Take 1 tablet by mouth daily.   Yes Historical Provider, MD  docusate (COLACE) 50 MG/5ML liquid Place 20 mLs (200 mg total) into feeding tube 2 (two) times daily. 06/09/14  Yes Bonnielee Haff, MD  guaifenesin (TUSSIN) 100 MG/5ML syrup Take 200 mg by mouth every 6 (six) hours as needed for cough.   Yes Historical Provider, MD  ipratropium (ATROVENT) 0.02 % nebulizer solution Take 0.5 mg by nebulization  every 6 (six) hours as needed for wheezing or shortness of breath.   Yes Historical Provider, MD  levETIRAcetam (KEPPRA) 100 MG/ML solution 750mg  every day at 1400hrs and 2000hrs Patient taking differently: Place 750 mg into feeding tube 2 (two) times daily. at 1400hrs and 2000 06/09/14  Yes Bonnielee Haff, MD  levETIRAcetam (KEPPRA) 100 MG/ML solution Place 500 mg into feeding tube every morning. 6:00 A.M   Yes Historical Provider, MD  levothyroxine (SYNTHROID, LEVOTHROID) 100 MCG tablet Take 100 mcg by mouth daily before breakfast.   Yes Historical Provider, MD  Magnesium Hydroxide (MILK OF MAGNESIA PO) Take 30 mLs by mouth every three (3) days as needed (if no BM for 3 days.).   Yes Historical Provider, MD  metoprolol tartrate (LOPRESSOR) 25 MG tablet Take 12.5 mg by mouth 2 (two) times daily.   Yes Historical Provider, MD  Nutritional Supplements (FEEDING SUPPLEMENT, JEVITY 1.2 CAL,) LIQD Place into feeding tube 4 (four) times daily. 8 oz- @@ 10 am , 2pm, 10 pm, 2 am   Yes Historical Provider, MD  polyethylene glycol (MIRALAX / GLYCOLAX) packet Take 17 g by mouth daily. 08/03/14  Yes Wandra Arthurs, MD  potassium chloride 20 MEQ/15ML (10%) SOLN Take 10 mEq by mouth daily. End date 10/25/14 10/20/14  Yes Historical Provider, MD  Valproic Acid (DEPAKENE) 250 MG/5ML SYRP syrup Place 15 mLs (750 mg total) into feeding tube 3 (three) times daily. 06/09/14  Yes Bonnielee Haff, MD  vitamin C (ASCORBIC ACID) 500 MG tablet Take 500 mg by mouth daily.    Yes Historical Provider, MD  Water For Irrigation, Sterile (FREE WATER) SOLN Place 200 mLs into feeding tube every 6 (six) hours. Patient taking differently: Place 30 mLs into feeding tube. Before and after giving medications. 06/09/14  Yes Bonnielee Haff, MD  levETIRAcetam (KEPPRA) 100 MG/ML solution Place 5 mLs (500 mg total) into feeding tube daily at 6 (six) AM. Patient not taking: Reported on 10/26/2014 06/09/14   Bonnielee Haff, MD  Nutritional Supplements  (FEEDING SUPPLEMENT, JEVITY 1.2 CAL,) LIQD Place 1,000 mLs into feeding tube daily. At 68ml/hr Patient not taking: Reported on 10/26/2014 06/09/14   Bonnielee Haff, MD  Sodium Phosphates (RA SALINE ENEMA RE) Place 1 application rectally daily as needed (if not relieved by bisacodyl in 24 hrs).    Historical Provider, MD    Physical Exam: Filed Vitals:   10/26/14 1921 10/26/14 2030 10/26/14 2042 10/26/14 2100  BP:  142/113  149/84  Pulse:   119 119  Temp: 102 F (38.9 C)     TempSrc: Core (Comment)     Resp:  30 46 20  Weight:      SpO2:   99% 100%    General: Alert, Awake and appears restless. Appear in mild distress Eyes: PERRL ENT: Oral Mucosa clear dry. Neck: Difficult to assess JVD Cardiovascular: S1 and S2 Present,  aortic systolic  Murmur, Peripheral Pulses Present Respiratory: Bilateral Air entry equal and Decreased, bilateral Crackles, no wheezes  Abdomen: Bowel Sound absent , Soft and non tender Skin: no Rash Extremities: no Pedal edema, no calf tenderness Neurologic: Grossly no focal neuro deficit.  Labs on Admission:  CBC:  Recent Labs Lab 10/26/14 1645  WBC 18.9*  NEUTROABS 15.5*  HGB 9.0*  HCT 28.9*  MCV 94.4  PLT 460*    CMP     Component Value Date/Time   NA 138 10/26/2014 1645   NA 136* 07/27/2014   K 5.1 10/26/2014 1645   CL 103 10/26/2014 1645   CO2 26 10/26/2014 1645   GLUCOSE 88 10/26/2014 1645   BUN 17 10/26/2014 1645   BUN 16 07/27/2014   CREATININE 0.54 10/26/2014 1645   CREATININE 0.4* 07/27/2014   CALCIUM 8.4 10/26/2014 1645   PROT 8.2 10/26/2014 1645   ALBUMIN 2.5* 10/26/2014 1645   AST 22 10/26/2014 1645   ALT 12 10/26/2014 1645   ALKPHOS 100 10/26/2014 1645   BILITOT 0.5 10/26/2014 1645   GFRNONAA >90 10/26/2014 1645   GFRAA >90 10/26/2014 1645    No results for input(s): LIPASE, AMYLASE in the last 168 hours. No results for input(s): AMMONIA in the last 168 hours.  No results for input(s): CKTOTAL, CKMB, CKMBINDEX,  TROPONINI in the last 168 hours. BNP (last 3 results)  Recent Labs  10/28/13 1547 06/08/14 0715  PROBNP 488.2* 899.3*    Radiological Exams on Admission: Dg Chest Port 1 View  10/26/2014   CLINICAL DATA:  Shortness of breath, cough, fever.  EXAM: PORTABLE CHEST - 1 VIEW  COMPARISON:  August 03, 2014.  FINDINGS: Stable cardiomediastinal silhouette. Hypoinflation of the lungs is noted. Mild central pulmonary vascular congestion is noted. Moderate right basilar opacity is noted consistent with pneumonia or atelectasis with associated pleural effusion. Patient is rotated to the right.  IMPRESSION: Mild central pulmonary vascular congestion. Moderate right basilar opacity consistent with pneumonia or atelectasis with associated pleural effusion.   Electronically Signed   By: Sabino Dick M.D.   On: 10/26/2014 18:51    EKG: Independently reviewed. sinus tachycardia.  Assessment/Plan Principal Problem:   HCAP (healthcare-associated pneumonia) Active Problems:   Seizure disorder   MR (mental retardation)   Hypothyroidism   Anemia of chronic disease   Diastolic heart failure, NYHA class 2   Paroxysmal a-fib   Dysphasia   Sepsis   1. HCAP (healthcare-associated pneumonia) Patient is presenting with complaints of cough agitation fever and restlessness. He has been treated with Augmentin despite which she continues to have fever today. Chest x-ray shows vascular congestion with pneumonia. She has had back to infection as well as scalp hematoma infection in the past for which she has been treated. Currently she will be admitted in stepdown unit  Since her blood pressure is stable holding aggressive IV hydration due to her history of diastolic dysfunction and chest x-ray showing vascular congestion. Continue with vancomycin and Zosyn. Holding tube feedings at present. Follow blood culture as well as sputum culture and urine antigens, nebulizers as needed.  2. agitation. We will attempt  to use Haldol is not prolonged at present. Continue close monitoring.  3.history of seizures. Patient is on Keppra 3 times a day as well as Depakote 3 times a day which I would continue via peg tube  4. chronic diastolic dysfunction. A. Fib Sinus tachycardia with accelerated hypertension at present  Patient has been treated with  Lasix in the past. At present holding further IV hydration since her blood pressure has been stabilized. Metoprolol was increased  from twice a day to 3 times a day. Not a candidate for anticoagulation. Monitor ins and outs and daily weight. Probably agitation is contributing to sinus tachycardia and accelerated hypertension. Continue close monitoring  5. Constipation, abnormal bowel sounds patient does not appear to have any increased agitation on stomach palpation x-ray abdomen shows stool present throughout the colon on. Continue using stool softeners .  Advance goals of care discussion: DNR/DNI    DVT Prophylaxis: subcutaneous Heparin Nutrition:  Nothing by mouth   Family Communication:  family  was present at bedside, opportunity was given to ask question and all questions were answered satisfactorily at the time of interview. Disposition: Admitted to inpatient in step-down unit due to increased agitation.  Author: Berle Mull, MD [facility] Pager: [phone] 10/26/2014, 9:33 PM    If 7PM-7AM, please contact night-coverage [email or web address] Password [email or web address]

## 2014-10-27 ENCOUNTER — Encounter (HOSPITAL_COMMUNITY): Payer: Self-pay | Admitting: *Deleted

## 2014-10-27 DIAGNOSIS — E038 Other specified hypothyroidism: Secondary | ICD-10-CM

## 2014-10-27 LAB — URINE CULTURE
CULTURE: NO GROWTH
Colony Count: NO GROWTH

## 2014-10-27 LAB — LEGIONELLA ANTIGEN, URINE

## 2014-10-27 LAB — MRSA PCR SCREENING: MRSA by PCR: NEGATIVE

## 2014-10-27 LAB — GLUCOSE, CAPILLARY: GLUCOSE-CAPILLARY: 94 mg/dL (ref 70–99)

## 2014-10-27 LAB — BRAIN NATRIURETIC PEPTIDE: B Natriuretic Peptide: 257.3 pg/mL — ABNORMAL HIGH (ref 0.0–100.0)

## 2014-10-27 LAB — STREP PNEUMONIAE URINARY ANTIGEN: Strep Pneumo Urinary Antigen: NEGATIVE

## 2014-10-27 MED ORDER — JEVITY 1.2 CAL PO LIQD
237.0000 mL | Freq: Four times a day (QID) | ORAL | Status: DC
  Administered 2014-10-27 – 2014-10-28 (×3): 237 mL
  Filled 2014-10-27 (×8): qty 237

## 2014-10-27 NOTE — Progress Notes (Signed)
Utilization review completed. Nyeema Want, RN, BSN. 

## 2014-10-27 NOTE — Progress Notes (Signed)
INITIAL NUTRITION ASSESSMENT  DOCUMENTATION CODES Per approved criteria  -Not Applicable   INTERVENTION: - Once medically able, recommend initiation of Jevity 1.2 @ 20 ml/hr via peg and increase by 10 ml every 4 hours to goal rate of 55 ml/hr.   Tube feeding regimen provides 1584 kcal (100% of needs), 73 grams of protein, and 1069 ml of H2O.   NUTRITION DIAGNOSIS: Inadequate oral intake related to inability to eat as evidenced by NPO.   Goal: Pt to meet >/= 90% of their estimated nutrition needs   Monitor:  Weight trend, NPO, TF initiation and management, labs  Reason for Assessment: Low Braden Score  69 y.o. female  Admitting Dx: HCAP (healthcare-associated pneumonia)  ASSESSMENT: 69 y.o. 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 The patient presents with complaints of shortness of breath and cough.  - Spoke with RN from [facility]. Pt's TF regimen at SNF is Jevity 1.5- 1.5 cans at 6am and 6pm. Jevity 1.2- 1 can at 10 am and 2 pm. TF regimen provides 1280 kcal and 56 g protein. - Unsure if pt normally tolerates any po intake. Currently NPO. - Pt's weight currently stable.  - No signs of fat or muscle wasting.  Labs: Na and K WNL BUN WNL  Height: Ht Readings from Last 1 Encounters:  10/26/14 5\' 3"  (1.6 m)    Weight: Wt Readings from Last 1 Encounters:  10/27/14 145 lb 15.1 oz (66.2 kg)    Ideal Body Weight: 52.4 kg  % Ideal Body Weight: 126%  Wt Readings from Last 10 Encounters:  10/27/14 145 lb 15.1 oz (66.2 kg)  06/07/14 144 lb 9.6 oz (65.59 kg)  12/29/13 125 lb 3.2 oz (56.79 kg)  12/21/13 134 lb 0.6 oz (60.8 kg)  11/01/13 137 lb 2 oz (62.2 kg)  02/12/12 116 lb (52.617 kg)   BMI:  Body mass index is 25.86 kg/(m^2).  Estimated Nutritional Needs: Kcal: 1500-1700 Protein: 70-80 g Fluid: 1.5-1.7 L/day  Skin: Intact  Diet Order: Diet NPO  time specified  EDUCATION NEEDS: -No education needs identified at this time   Intake/Output Summary (Last 24 hours) at 10/27/14 1101 Last data filed at 10/27/14 7846  Gross per 24 hour  Intake    150 ml  Output    450 ml  Net   -300 ml    Last BM: prior to admission   Labs:   Recent Labs Lab 10/26/14 1645  NA 138  K 5.1  CL 103  CO2 26  BUN 17  CREATININE 0.54  CALCIUM 8.4  GLUCOSE 88    CBG (last 3)   Recent Labs  10/27/14 0006  GLUCAP 94    Scheduled Meds: . docusate  200 mg Per Tube BID  . free water  200 mL Per Tube q12n4p  . heparin  5,000 Units Subcutaneous 3 times per day  . levETIRAcetam  500 mg Per Tube Q0600  . levETIRAcetam  750 mg Per Tube 2 times per day  . levothyroxine  100 mcg Per Tube QAC breakfast  . metoprolol tartrate  12.5 mg Per Tube Q8H  . piperacillin-tazobactam (ZOSYN)  IV  3.375 g Intravenous Q8H  . polyethylene glycol  17 g Per Tube Daily  . Valproic Acid  750 mg Per Tube TID  . vancomycin  500 mg Intravenous Q12H    Continuous Infusions:   Past Medical History  Diagnosis Date  .  Seizures   . Osteoporosis   . Thyroid disease     hypothyroid  . High cholesterol   . Cataracts, bilateral   . Optic atrophy   . Paraparesis     mild  . Hypothyroidism   . Anginal pain   . Coronary artery disease     Past Surgical History  Procedure Laterality Date  . Midline incision    . Central venous catheter insertion  05/15/2014       . Esophagogastroduodenoscopy N/A 06/03/2014    Procedure: ESOPHAGOGASTRODUODENOSCOPY (EGD);  Surgeon: Beryle Beams, MD;  Location: [facility] ENDOSCOPY;  Service: Endoscopy;  Laterality: N/A;    Laurette Schimke MS, RD, LDN

## 2014-10-27 NOTE — Progress Notes (Signed)
[facility] Progress Note   Dawn [name] ZOX:[id number] DOB: 1946-04-24 DOA: 10/26/2014 PCP: [name]  Brief narrative: Dawn [name] is a 69 y.o. 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 She was sent from the SNF for dyspnea and cough and was admitted for pneumonia.    Subjective: Non-verbal.   Assessment/Plan: Principal Problem:   HCAP (healthcare-associated pneumonia)--  Sepsis - cont Zosyn and Vancmycin- currently no respiratory distress noted.   Active Problems:   Seizure disorder - cont Keppra    MR (mental retardation)    Hypothyroidism - cont levothyroxine    Diastolic heart failure, NYHA class 2 - euvolemic    Paroxysmal a-fib - cont Metoprolol -not on anticoagulation    Dysphasia - resume tube feeds   Code Status: DNR Family Communication:  Disposition Plan: return to SNF when stable DVT prophylaxis: heparin  Consultants:   Procedures:   Antibiotics: Anti-infectives    Start     Dose/Rate Route Frequency Ordered Stop   10/27/14 0600  vancomycin (VANCOCIN) 500 mg in sodium chloride 0.9 % 100 mL IVPB     500 mg100 mL/hr over 60 Minutes Intravenous Every 12 hours 10/26/14 1815     10/27/14 0300  piperacillin-tazobactam (ZOSYN) IVPB 3.375 g     3.375 g12.5 mL/hr over 240 Minutes Intravenous Every 8 hours 10/26/14 1815     10/26/14 1630  piperacillin-tazobactam (ZOSYN) IVPB 3.375 g     3.375 g100 mL/hr over 30 Minutes Intravenous  Once 10/26/14 1625 10/26/14 1732   10/26/14 1630  vancomycin (VANCOCIN) IVPB 1000 mg/200 mL premix     1,000 mg200 mL/hr over 60 Minutes Intravenous  Once 10/26/14 1625 10/26/14 2039         Objective: Filed Weights   10/26/14 1708 10/26/14 2300 10/27/14 0400  Weight: 65.318 kg (144 lb) 64.6 kg (142 lb 6.7 oz) 66.2 kg (145 lb 15.1 oz)    Intake/Output Summary (Last 24 hours) at 10/27/14 1557 Last data filed at  10/27/14 8119  Gross per 24 hour  Intake    150 ml  Output    450 ml  Net   -300 ml     Vitals Filed Vitals:   10/27/14 0500 10/27/14 0600 10/27/14 0816 10/27/14 1200  BP: 104/67 105/65 108/56   Pulse: 91 79 85   Temp:   97.9 F (36.6 C) 98.3 F (36.8 C)  TempSrc:   Axillary Oral  Resp: 20 30 17    Height:      Weight:      SpO2: 100% 100% 100%     Exam: General: No acute respiratory distress Lungs: Clear to auscultation bilaterally without wheezes or crackles Cardiovascular: Regular rate and rhythm without murmur gallop or rub normal S1 and S2 Abdomen: Nontender, nondistended, soft, bowel sounds positive, no rebound, no ascites, no appreciable mass Extremities: No significant cyanosis, clubbing, or edema bilateral lower extremities  Data Reviewed: Basic Metabolic Panel:  Recent Labs Lab 10/26/14 1645  NA 138  K 5.1  CL 103  CO2 26  GLUCOSE 88  BUN 17  CREATININE 0.54  CALCIUM 8.4   Liver Function Tests:  Recent Labs Lab 10/26/14 1645  AST 22  ALT 12  ALKPHOS 100  BILITOT 0.5  PROT 8.2  ALBUMIN 2.5*   No results for input(s): LIPASE, AMYLASE in the last 168 hours. No results for input(s): AMMONIA in the last 168 hours. CBC:  Recent Labs Lab 10/26/14  1645  WBC 18.9*  NEUTROABS 15.5*  HGB 9.0*  HCT 28.9*  MCV 94.4  PLT 460*   Cardiac Enzymes: No results for input(s): CKTOTAL, CKMB, CKMBINDEX, TROPONINI in the last 168 hours. BNP (last 3 results)  Recent Labs  10/28/13 1547 06/08/14 0715  PROBNP 488.2* 899.3*   CBG:  Recent Labs Lab 10/27/14 0006  GLUCAP 94    Recent Results (from the past 240 hour(s))  MRSA PCR Screening     Status: None   Collection Time: 10/26/14 11:11 PM  Result Value Ref Range Status   MRSA by PCR NEGATIVE NEGATIVE Final    Comment:        The GeneXpert MRSA Assay (FDA approved for NASAL specimens only), is one component of a comprehensive MRSA colonization surveillance program. It is not intended to  diagnose MRSA infection nor to guide or monitor treatment for MRSA infections.      Studies:  Recent x-ray studies have been reviewed in detail by the Attending Physician  Scheduled Meds:  Scheduled Meds: . docusate  200 mg Per Tube BID  . free water  200 mL Per Tube q12n4p  . heparin  5,000 Units Subcutaneous 3 times per day  . levETIRAcetam  500 mg Per Tube Q0600  . levETIRAcetam  750 mg Per Tube 2 times per day  . levothyroxine  100 mcg Per Tube QAC breakfast  . metoprolol tartrate  12.5 mg Per Tube Q8H  . piperacillin-tazobactam (ZOSYN)  IV  3.375 g Intravenous Q8H  . polyethylene glycol  17 g Per Tube Daily  . Valproic Acid  750 mg Per Tube TID  . vancomycin  500 mg Intravenous Q12H   Continuous Infusions:   Time spent on care of this patient: 35 min   [place], MD 10/27/2014, 3:57 PM  LOS: 1 day   [facility] Office  [phone] Pager - Text Page per [email or web address]  If 7PM-7AM, please contact night-coverage [email or web address]

## 2014-10-27 NOTE — Clinical Documentation Improvement (Signed)
Due to pt. lack of mobility and ability to met her ADL. The specified condition may indicate the pt. severity of illness and resources necessary for care. Please specify if pt. meets the possible clinical condition _______Functional quadriplegia ________Paraparesis, document side____ ________Paraplegia,  Document side____   Supporting information: "ED provider documented in notes wheelchair bound  PMH  mild Paraparesis" Nursing staff ED notes" pt. with paraplegia" H&P note: " At her baseline the patient is nonverbal and does not follow, her baseline dependent for most of her ADL. Nutritional: NPO,Jevity 4 times a day via PEG tube "  Thank you,  Philippa Chester ,RN Clinical Documentation Specialist:  [facility]

## 2014-10-28 ENCOUNTER — Inpatient Hospital Stay (HOSPITAL_COMMUNITY): Payer: Medicare Other

## 2014-10-28 LAB — BASIC METABOLIC PANEL
Anion gap: 8 (ref 5–15)
BUN: 13 mg/dL (ref 6–23)
CO2: 27 mmol/L (ref 19–32)
Calcium: 8.7 mg/dL (ref 8.4–10.5)
Chloride: 109 mEq/L (ref 96–112)
Creatinine, Ser: 0.58 mg/dL (ref 0.50–1.10)
GFR calc non Af Amer: >90 mL/min (ref >90)
Glucose, Bld: 93 mg/dL (ref 70–99)
Potassium: 4.3 mmol/L (ref 3.5–5.1)
SODIUM: 144 mmol/L (ref 135–145)

## 2014-10-28 LAB — CLOSTRIDIUM DIFFICILE BY PCR: CDIFFPCR: NEGATIVE

## 2014-10-28 MED ORDER — JEVITY 1.5 CAL/FIBER PO LIQD: 237.0000 mL | Freq: Two times a day (BID) | ORAL | Status: DC

## 2014-10-28 MED ORDER — FREE WATER
200.0000 mL | Freq: Every day | Status: DC
  Administered 2014-10-28 – 2014-10-30 (×12): 200 mL

## 2014-10-28 MED ORDER — JEVITY 1.5 CAL/FIBER PO LIQD
360.0000 mL | Freq: Two times a day (BID) | ORAL | Status: DC
  Administered 2014-10-28 – 2014-10-30 (×4): 360 mL
  Filled 2014-10-28 (×7): qty 1000

## 2014-10-28 MED ORDER — JEVITY 1.2 CAL PO LIQD
237.0000 mL | Freq: Two times a day (BID) | ORAL | Status: DC
  Administered 2014-10-28 – 2014-10-30 (×4): 237 mL
  Filled 2014-10-28 (×8): qty 237

## 2014-10-28 MED ORDER — FREE WATER
200.0000 mL | Freq: Four times a day (QID) | Status: DC
  Administered 2014-10-28: 200 mL

## 2014-10-28 NOTE — Progress Notes (Signed)
[facility] Progress Note   Dawn [name] JJK:[id number] DOB: 06-26-1946 DOA: 10/26/2014 PCP: [name]  Brief narrative: Dawn [name] is a 69 y.o. 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 She was sent from the SNF for dyspnea and cough and was admitted for pneumonia.    Subjective: Non-verbal.   Assessment/Plan: Principal Problem:   HCAP (healthcare-associated pneumonia)--  Sepsis - cont Zosyn and Vancomycin- currently no respiratory distress noted.  - will repeat CXR today at there was not much seen on the first CXR  Active Problems:   Seizure disorder - cont Keppra    MR (mental retardation)    Hypothyroidism - cont levothyroxine    Diastolic heart failure, NYHA class 2 - euvolemic    Paroxysmal a-fib - cont Metoprolol -not on anticoagulation    Dysphasia - resumed tube feeds  Loose stools - c diff negative- will increase free water to prevent dehydration. - will give a one time dose of Lomotil   Code Status: DNR Family Communication:  Disposition Plan: return to SNF when stable DVT prophylaxis: heparin  Consultants:   Procedures:   Antibiotics: Anti-infectives    Start     Dose/Rate Route Frequency Ordered Stop   10/27/14 0600  vancomycin (VANCOCIN) 500 mg in sodium chloride 0.9 % 100 mL IVPB     500 mg100 mL/hr over 60 Minutes Intravenous Every 12 hours 10/26/14 1815     10/27/14 0300  piperacillin-tazobactam (ZOSYN) IVPB 3.375 g     3.375 g12.5 mL/hr over 240 Minutes Intravenous Every 8 hours 10/26/14 1815     10/26/14 1630  piperacillin-tazobactam (ZOSYN) IVPB 3.375 g     3.375 g100 mL/hr over 30 Minutes Intravenous  Once 10/26/14 1625 10/26/14 1732   10/26/14 1630  vancomycin (VANCOCIN) IVPB 1000 mg/200 mL premix     1,000 mg200 mL/hr over 60 Minutes Intravenous  Once 10/26/14 1625 10/26/14 2039         Objective: Filed Weights   10/26/14  2300 10/27/14 0400 10/28/14 0300  Weight: 64.6 kg (142 lb 6.7 oz) 66.2 kg (145 lb 15.1 oz) 64.683 kg (142 lb 9.6 oz)    Intake/Output Summary (Last 24 hours) at 10/28/14 1246 Last data filed at 10/28/14 0936  Gross per 24 hour  Intake   1071 ml  Output    800 ml  Net    271 ml     Vitals Filed Vitals:   10/28/14 0300 10/28/14 0400 10/28/14 0600 10/28/14 0800  BP:  93/55 107/54 136/72  Pulse:  109  101  Temp:  98.7 F (37.1 C)  98.2 F (36.8 C)  TempSrc:  Axillary  Oral  Resp:  34 61 40  Height:      Weight: 64.683 kg (142 lb 9.6 oz)     SpO2:  92% 98% 98%    Exam: General: No acute respiratory distress Lungs: Clear to auscultation bilaterally without wheezes or crackles Cardiovascular: Regular rate and rhythm without murmur gallop or rub normal S1 and S2 Abdomen: Nontender, nondistended, soft, bowel sounds positive, no rebound, no ascites, no appreciable mass Extremities: No significant cyanosis, clubbing, or edema bilateral lower extremities  Data Reviewed: Basic Metabolic Panel:  Recent Labs Lab 10/26/14 1645 10/28/14 0231  NA 138 144  K 5.1 4.3  CL 103 109  CO2 26 27  GLUCOSE 88 93  BUN 17 13  CREATININE 0.54 0.58  CALCIUM 8.4 8.7   Liver  Function Tests:  Recent Labs Lab 10/26/14 1645  AST 22  ALT 12  ALKPHOS 100  BILITOT 0.5  PROT 8.2  ALBUMIN 2.5*   No results for input(s): LIPASE, AMYLASE in the last 168 hours. No results for input(s): AMMONIA in the last 168 hours. CBC:  Recent Labs Lab 10/26/14 1645  WBC 18.9*  NEUTROABS 15.5*  HGB 9.0*  HCT 28.9*  MCV 94.4  PLT 460*   Cardiac Enzymes: No results for input(s): CKTOTAL, CKMB, CKMBINDEX, TROPONINI in the last 168 hours. BNP (last 3 results)  Recent Labs  10/28/13 1547 06/08/14 0715  PROBNP 488.2* 899.3*   CBG:  Recent Labs Lab 10/27/14 0006  GLUCAP 94    Recent Results (from the past 240 hour(s))  Blood Culture (routine x 2)     Status: None (Preliminary result)    Collection Time: 10/26/14  4:45 PM  Result Value Ref Range Status   Specimen Description BLOOD LEFT HAND  Final   Special Requests BOTTLES DRAWN AEROBIC ONLY 3CC  Final   Culture   Final           BLOOD CULTURE RECEIVED NO GROWTH TO DATE CULTURE WILL BE HELD FOR 5 DAYS BEFORE ISSUING A FINAL NEGATIVE REPORT Performed at [facility]    Report Status PENDING  Incomplete  Blood Culture (routine x 2)     Status: None (Preliminary result)   Collection Time: 10/26/14  4:45 PM  Result Value Ref Range Status   Specimen Description BLOOD LEFT ARM  Final   Special Requests BOTTLES DRAWN AEROBIC ONLY 3CC  Final   Culture   Final           BLOOD CULTURE RECEIVED NO GROWTH TO DATE CULTURE WILL BE HELD FOR 5 DAYS BEFORE ISSUING A FINAL NEGATIVE REPORT Performed at [facility]    Report Status PENDING  Incomplete  Urine culture     Status: None   Collection Time: 10/26/14  5:08 PM  Result Value Ref Range Status   Specimen Description URINE, CATHETERIZED  Final   Special Requests NONE  Final   Colony Count NO GROWTH Performed at [facility]   Final   Culture NO GROWTH Performed at [facility]   Final   Report Status 10/27/2014 FINAL  Final  MRSA PCR Screening     Status: None   Collection Time: 10/26/14 11:11 PM  Result Value Ref Range Status   MRSA by PCR NEGATIVE NEGATIVE Final    Comment:        The GeneXpert MRSA Assay (FDA approved for NASAL specimens only), is one component of a comprehensive MRSA colonization surveillance program. It is not intended to diagnose MRSA infection nor to guide or monitor treatment for MRSA infections.   Clostridium Difficile by PCR     Status: None   Collection Time: 10/27/14  9:13 PM  Result Value Ref Range Status   C difficile by pcr NEGATIVE NEGATIVE Final     Studies:  Recent x-ray studies have been reviewed in detail by the Attending Physician  Scheduled Meds:  Scheduled Meds: . docusate  200 mg  Per Tube BID  . feeding supplement (JEVITY 1.2 CAL)  237 mL Per Tube BID  . feeding supplement (JEVITY 1.5 CAL/FIBER)  360 mL Per Tube BID  . free water  200 mL Per Tube QID  . heparin  5,000 Units Subcutaneous 3 times per day  . levETIRAcetam  500 mg Per Tube Q0600  .  levETIRAcetam  750 mg Per Tube 2 times per day  . levothyroxine  100 mcg Per Tube QAC breakfast  . metoprolol tartrate  12.5 mg Per Tube Q8H  . piperacillin-tazobactam (ZOSYN)  IV  3.375 g Intravenous Q8H  . polyethylene glycol  17 g Per Tube Daily  . Valproic Acid  750 mg Per Tube TID  . vancomycin  500 mg Intravenous Q12H   Continuous Infusions:   Time spent on care of this patient: 35 min   [place], MD 10/28/2014, 12:46 PM  LOS: 2 days   [facility] Office  [phone] Pager - Text Page per [email or web address]  If 7PM-7AM, please contact night-coverage [email or web address]

## 2014-10-28 NOTE — Progress Notes (Signed)
Attempted to call report to the nurse but the nurse is not available and my name and phone number was left to the secretary for the nurse to call me back.

## 2014-10-28 NOTE — Progress Notes (Signed)
NUTRITION FOLLOW UP / CONSULT  Intervention:    Continue bolus TF regimen, change to same regimen as PTA: Jevity 1.5-1.5 cans at 6 AM and 6 PM, Jevity 1.2-1 can at 10 AM and 2 PM. This provides 1635 kcals, 72 gm protein, 922 ml free water daily (100% of kcal and protein needs).  200 ml free water flushes QID after each TF bolus.  Nutrition Dx:   Inadequate oral intake related to inability to eat as evidenced by NPO. Ongoing.  Goal:   Intake to meet >90% of estimated nutrition needs.  Monitor:   TF tolerance/adequacy, weight trend, labs.  Assessment:   69 y.o. 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 The patient presents with complaints of shortness of breath and cough.  Received MD Consult for TF initiation and management. Bolus TF was initiated yesterday evening with Jevity 1.2 1 can QID to provide 1140 kcals, 53 gm protein, 764 ml free water daily. This meets 76% of estimated kcal and protein needs. Patient remains NPO at this time.   Height: Ht Readings from Last 1 Encounters:  10/26/14 5\' 3"  (1.6 m)    Weight Status:   Wt Readings from Last 1 Encounters:  10/28/14 142 lb 9.6 oz (64.683 kg)    Re-estimated needs:  Kcal: 1500-1700 Protein: 70-80 gm Fluid: 1.5-1.7 L  Skin: intact  Diet Order: Diet NPO time specified   Intake/Output Summary (Last 24 hours) at 10/28/14 0942 Last data filed at 10/28/14 0630  Gross per 24 hour  Intake    784 ml  Output    800 ml  Net    -16 ml    Last BM: 1/13   Labs:   Recent Labs Lab 10/26/14 1645 10/28/14 0231  NA 138 144  K 5.1 4.3  CL 103 109  CO2 26 27  BUN 17 13  CREATININE 0.54 0.58  CALCIUM 8.4 8.7  GLUCOSE 88 93    CBG (last 3)   Recent Labs  10/27/14 0006  GLUCAP 94    Scheduled Meds: . docusate  200 mg Per Tube BID  . feeding supplement (JEVITY 1.2 CAL)  237 mL Per Tube QID  . free  water  200 mL Per Tube q12n4p  . heparin  5,000 Units Subcutaneous 3 times per day  . levETIRAcetam  500 mg Per Tube Q0600  . levETIRAcetam  750 mg Per Tube 2 times per day  . levothyroxine  100 mcg Per Tube QAC breakfast  . metoprolol tartrate  12.5 mg Per Tube Q8H  . piperacillin-tazobactam (ZOSYN)  IV  3.375 g Intravenous Q8H  . polyethylene glycol  17 g Per Tube Daily  . Valproic Acid  750 mg Per Tube TID  . vancomycin  500 mg Intravenous Q12H    Continuous Infusions:    Molli Barrows, RD, LDN, [place] Pager [phone] After Hours Pager [phone]

## 2014-10-29 LAB — CBC
HCT: 28.4 % — ABNORMAL LOW (ref 36.0–46.0)
HEMOGLOBIN: 8.6 g/dL — AB (ref 12.0–15.0)
MCH: 28.6 pg (ref 26.0–34.0)
MCHC: 30.3 g/dL (ref 30.0–36.0)
MCV: 94.4 fL (ref 78.0–100.0)
PLATELETS: 416 10*3/uL — AB (ref 150–400)
RBC: 3.01 MIL/uL — ABNORMAL LOW (ref 3.87–5.11)
RDW: 16.9 % — ABNORMAL HIGH (ref 11.5–15.5)
WBC: 11.8 10*3/uL — AB (ref 4.0–10.5)

## 2014-10-29 MED ORDER — DOXYCYCLINE HYCLATE 100 MG PO TABS
100.0000 mg | ORAL_TABLET | Freq: Two times a day (BID) | ORAL | Status: AC
Start: 2014-10-29 — End: 2014-11-01

## 2014-10-29 MED ORDER — JEVITY 1.2 CAL PO LIQD
ORAL | Status: AC
  Filled 2014-10-29: qty 237

## 2014-10-29 MED ORDER — WHITE PETROLATUM GEL
Status: AC
  Filled 2014-10-29: qty 1

## 2014-10-29 MED ORDER — AMOXICILLIN-POT CLAVULANATE 500-125 MG PO TABS: 1.0000 | ORAL_TABLET | Freq: Two times a day (BID) | ORAL | Status: DC

## 2014-10-29 MED ORDER — JEVITY 1.5 CAL/FIBER PO LIQD: 360.0000 mL | Freq: Two times a day (BID) | ORAL | Status: DC

## 2014-10-29 MED ORDER — JEVITY 1.2 CAL PO LIQD: 237.0000 mL | Freq: Two times a day (BID) | ORAL | Status: DC

## 2014-10-29 NOTE — Progress Notes (Signed)
Respirations 40, shallow. NAD. O2 @ 2L [place]. Dr. Wynelle Cleveland notified. Will continue to monitor.

## 2014-10-29 NOTE — Progress Notes (Signed)
144/90, 98, Resp 52. Expiratory wheezes. Pt appears uncomfortable/fidgety. Dr. Wynelle Cleveland notified. Orders to transfer to SDU.

## 2014-10-29 NOTE — Progress Notes (Signed)
Rapid Response RN, Langley Gauss, notified of pt's condition/transfer order. Coming to see pt.

## 2014-10-29 NOTE — Care Management (Signed)
IM from Medicine given. Irie Dowson RN BSN  

## 2014-10-29 NOTE — Progress Notes (Signed)
Report given to [place], [place]. Transferred to [facility].

## 2014-10-29 NOTE — Clinical Social Work Psychosocial (Signed)
Clinical Social Work Department BRIEF PSYCHOSOCIAL ASSESSMENT 10/29/2014  Patient:  Dawn [name], Dawn [name]     Account Number:  [id number]     Admit date:  10/26/2014  Clinical Social Worker:  Domenica Reamer, [place]  Date/Time:  10/29/2014 03:59 PM  Referred by:  Physician  Date Referred:  10/27/2014 Referred for  SNF Placement   Other Referral:   Interview type:  Family Other interview type:    PSYCHOSOCIAL DATA Living Status:  FACILITY Admitted from facility:  [facility] Level of care:  [place] Primary support name:  Mrs. Tora Perches Primary support relationship to patient:  [place] Degree of support available:   Patient has high level of support from her sister    CURRENT CONCERNS Current Concerns  Post-Acute Placement   Other Concerns:    [place] / PLAN CSW spoke with patients sister concerning return to [facility]- sister is agreeable to return. CSW will continue to follow.   Assessment/plan status:  Psychosocial Support/Ongoing Assessment of Needs Other assessment/ plan:   FL2 update   Information/referral to community resources:    PATIENT'S/FAMILY'S RESPONSE TO PLAN OF CARE: Patients sister is agreeable to plan for patient to return to [facility] but is concerned about patient leaving too soon.       Domenica Reamer, [place] Social Worker [phone]

## 2014-10-29 NOTE — Discharge Summary (Signed)
Physician Discharge Summary  Dawn [name] BJS:[id number] DOB: 01/12/1946 DOA: 10/26/2014  PCP: [name]  Admit date: 10/26/2014 Discharge date: 10/29/2014  Time spent: 60  minutes   Discharge Condition: stable Diet recommendation: tube feeds as ordered below- free water 200 cc every 6 hrs  Discharge Diagnoses:  Principal Problem:   HCAP (healthcare-associated pneumonia) Active Problems:   Seizure disorder   MR (mental retardation)   Hypothyroidism   Anemia of chronic disease   Diastolic heart failure, NYHA class 2   Paroxysmal a-fib   Dysphasia   Sepsis   History of present illness:  Dawn [name] is a 69 y.o. 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 The patient presents with complaints of shortness of breath and cough. History was obtained from family member, ED documentation. Patient is a resident of [place], 1 10/21/2014 she had complaints of cough and congestion and with leukocytosis she was treated with Augmentin for possible pneumonia as well as nebulization and Robitussin. She completed the course but continues to have progressively worsening cough. She was also becoming agitated with fever and tachycardia. At her baseline the patient is nonverbal and does not follow, arms. There was no recent fall reported. As per the family member patient has been having increasing diarrhea over last few days. Patient is receiving Jevity 4 times a day via PEG tube. Patient does not have a Foley catheter placed in the nursing home.  The patient is coming from SNF. And at her baseline dependent for most of her ADL.  Hospital Course:  Principal Problem:  HCAP (healthcare-associated pneumonia)-- Sepsis - improved with Zosyn and Vancomycin- transition to Augmentin and Doxycycline  - will repeat CXR reveals b/l basilar infiltrates  Active  Problems:  Seizure disorder - cont Keppra   MR (mental retardation)   Hypothyroidism - cont levothyroxine   Diastolic heart failure, NYHA class 2 - euvolemic   Paroxysmal a-fib - cont Metoprolol -not on anticoagulation   Dysphasia - resumed tube feeds  Loose stools - c diff negative- will increased free water to prevent dehydration. -  Lomotil PRN  Procedures:  none  Consultations:  none  Discharge Exam: Filed Weights   10/28/14 0300 10/29/14 0500 10/29/14 0545  Weight: 64.683 kg (142 lb 9.6 oz) 64.8 kg (142 lb 13.7 oz) 64.411 kg (142 lb)   Filed Vitals:   10/29/14 0545  BP: 125/67  Pulse: 82  Temp:   Resp:     General: AAO x 3, no distress Cardiovascular: RRR, no murmurs  Respiratory: clear to auscultation bilaterally GI: soft, non-tender, non-distended, bowel sound positive  Discharge Instructions You were cared for by a hospitalist during your hospital stay. If you have any questions about your discharge medications or the care you received while you were in the hospital after you are discharged, you can call the unit and asked to speak with the hospitalist on call if the hospitalist that took care of you is not available. Once you are discharged, your primary care physician will handle any further medical issues. Please note that NO REFILLS for any discharge medications will be authorized once you are discharged, as it is imperative that you return to your primary care physician (or establish a relationship with a primary care physician if you do not have one) for your aftercare needs so that they can reassess your need for medications and monitor your lab values.  Medication List    STOP taking these medications        polyethylene glycol packet  Commonly known as:  MIRALAX / GLYCOLAX      TAKE these medications        acetaminophen 160 MG/5ML solution  Commonly known as:  TYLENOL  Place 20.3 mLs (650 mg total) into feeding tube every 6  (six) hours as needed for mild pain, headache or fever.     albuterol (2.5 MG/3ML) 0.083% nebulizer solution  Commonly known as:  PROVENTIL  Take 3 mLs (2.5 mg total) by nebulization every 6 (six) hours as needed for wheezing or shortness of breath.     amoxicillin-clavulanate 500-125 MG per tablet  Commonly known as:  AUGMENTIN  Take 1 tablet (500 mg total) by mouth 2 (two) times daily.     aspirin EC 81 MG tablet  Take 1 tablet (81 mg total) by mouth daily.     atorvastatin 10 MG tablet  Commonly known as:  LIPITOR  10 mg by PEG Tube route daily.     BISAC-EVAC 10 MG suppository  Generic drug:  bisacodyl  Place 10 mg rectally daily as needed for moderate constipation (in 24 hrs).     docusate 50 MG/5ML liquid  Commonly known as:  COLACE  Place 20 mLs (200 mg total) into feeding tube 2 (two) times daily.     doxycycline 100 MG tablet  Commonly known as:  VIBRA-TABS  Take 1 tablet (100 mg total) by mouth 2 (two) times daily.     feeding supplement (JEVITY 1.2 CAL) Liqd  Place 237 mLs into feeding tube 2 (two) times daily.     feeding supplement (JEVITY 1.5 CAL/FIBER) Liqd  Place 360 mLs into feeding tube 2 (two) times daily.     free water Soln  Place 200 mLs into feeding tube every 6 (six) hours.     ipratropium 0.02 % nebulizer solution  Commonly known as:  ATROVENT  Take 0.5 mg by nebulization every 6 (six) hours as needed for wheezing or shortness of breath.     levETIRAcetam 100 MG/ML solution  Commonly known as:  KEPPRA  Place 5 mLs (500 mg total) into feeding tube daily at 6 (six) AM.     levETIRAcetam 100 MG/ML solution  Commonly known as:  KEPPRA  750mg  every day at 1400hrs and 2000hrs     levothyroxine 100 MCG tablet  Commonly known as:  SYNTHROID, LEVOTHROID  Take 100 mcg by mouth daily before breakfast.     metoprolol tartrate 25 MG tablet  Commonly known as:  LOPRESSOR  Take 12.5 mg by mouth 2 (two) times daily.     MILK OF MAGNESIA PO  Take 30  mLs by mouth every three (3) days as needed (if no BM for 3 days.).     OYSTER CALCIUM 500 + D 500-200 MG-UNIT per tablet  Generic drug:  calcium-vitamin D  Take 1 tablet by mouth daily.     potassium chloride 20 MEQ/15ML (10%) Soln  Take 10 mEq by mouth daily. End date 10/25/14     RA SALINE ENEMA RE  Place 1 application rectally daily as needed (if not relieved by bisacodyl in 24 hrs).     TUSSIN 100 MG/5ML syrup  Generic drug:  guaifenesin  Take 200 mg by mouth every 6 (six) hours as needed for cough.     Valproic Acid 250 MG/5ML Syrp syrup  Commonly known as:  DEPAKENE  Place 15 mLs (  750 mg total) into feeding tube 3 (three) times daily.     vitamin C 500 MG tablet  Commonly known as:  ASCORBIC ACID  Take 500 mg by mouth daily.       Allergies  Allergen Reactions  . Carvedilol   . Ppd [Tuberculin Purified Protein Derivative]     Per [facility]      The results of significant diagnostics from this hospitalization (including imaging, microbiology, ancillary and laboratory) are listed below for reference.    Significant Diagnostic Studies: Dg Chest Port 1 View  10/28/2014   CLINICAL DATA:  Subsequent evaluation of pneumonia right lung base, patient unresponsive  EXAM: PORTABLE CHEST - 1 VIEW  COMPARISON:  10/26/2014  FINDINGS: Limited study. Significant cardiac enlargement stable. Extensive hazy density over both lung bases suggest increasing bilateral pleural effusions. Underlying airspace disease also suspected which could be due to atelectasis or pneumonia.  IMPRESSION: Bilateral pleural effusions. Bibasilar airspace disease could be due to atelectasis or pneumonia. The degree of bibasilar opacity is greater than it was on the prior study.   Electronically Signed   By: Skipper Cliche M.D.   On: 10/28/2014 14:55   Dg Chest Port 1 View  10/26/2014   CLINICAL DATA:  Shortness of breath, cough, fever.  EXAM: PORTABLE CHEST - 1 VIEW  COMPARISON:  August 03, 2014.  FINDINGS: Stable  cardiomediastinal silhouette. Hypoinflation of the lungs is noted. Mild central pulmonary vascular congestion is noted. Moderate right basilar opacity is noted consistent with pneumonia or atelectasis with associated pleural effusion. Patient is rotated to the right.  IMPRESSION: Mild central pulmonary vascular congestion. Moderate right basilar opacity consistent with pneumonia or atelectasis with associated pleural effusion.   Electronically Signed   By: Sabino Dick M.D.   On: 10/26/2014 18:51   Dg Abd Portable 2v  10/26/2014   CLINICAL DATA:  Abdominal discomfort, absent bowel sounds. History paraplegia. Fever intact cardiac  EXAM: PORTABLE ABDOMEN - 2 VIEW  COMPARISON:  Chest radiograph earlier this day.  FINDINGS: Gastrostomy tube in the upper abdomen. There are surgical clips in the right upper quadrant. No dilated small bowel loops to suggest obstruction. There is stool throughout the ascending, transverse, and sigmoid colon. There is no free air. Lower pelvis not included in the field of view. Right pleural effusion is noted.  IMPRESSION: Nonobstructive bowel gas pattern.  No free air.   Electronically Signed   By: Jeb Levering M.D.   On: 10/26/2014 21:34    Microbiology: Recent Results (from the past 240 hour(s))  Blood Culture (routine x 2)     Status: None (Preliminary result)   Collection Time: 10/26/14  4:45 PM  Result Value Ref Range Status   Specimen Description BLOOD LEFT HAND  Final   Special Requests BOTTLES DRAWN AEROBIC ONLY 3CC  Final   Culture   Final           BLOOD CULTURE RECEIVED NO GROWTH TO DATE CULTURE WILL BE HELD FOR 5 DAYS BEFORE ISSUING A FINAL NEGATIVE REPORT Performed at [facility]    Report Status PENDING  Incomplete  Blood Culture (routine x 2)     Status: None (Preliminary result)   Collection Time: 10/26/14  4:45 PM  Result Value Ref Range Status   Specimen Description BLOOD LEFT ARM  Final   Special Requests BOTTLES DRAWN AEROBIC ONLY 3CC   Final   Culture   Final           BLOOD  CULTURE RECEIVED NO GROWTH TO DATE CULTURE WILL BE HELD FOR 5 DAYS BEFORE ISSUING A FINAL NEGATIVE REPORT Performed at [facility]    Report Status PENDING  Incomplete  Urine culture     Status: None   Collection Time: 10/26/14  5:08 PM  Result Value Ref Range Status   Specimen Description URINE, CATHETERIZED  Final   Special Requests NONE  Final   Colony Count NO GROWTH Performed at [facility]   Final   Culture NO GROWTH Performed at [facility]   Final   Report Status 10/27/2014 FINAL  Final  MRSA PCR Screening     Status: None   Collection Time: 10/26/14 11:11 PM  Result Value Ref Range Status   MRSA by PCR NEGATIVE NEGATIVE Final    Comment:        The GeneXpert MRSA Assay (FDA approved for NASAL specimens only), is one component of a comprehensive MRSA colonization surveillance program. It is not intended to diagnose MRSA infection nor to guide or monitor treatment for MRSA infections.   Clostridium Difficile by PCR     Status: None   Collection Time: 10/27/14  9:13 PM  Result Value Ref Range Status   C difficile by pcr NEGATIVE NEGATIVE Final     Labs: Basic Metabolic Panel:  Recent Labs Lab 10/26/14 1645 10/28/14 0231  NA 138 144  K 5.1 4.3  CL 103 109  CO2 26 27  GLUCOSE 88 93  BUN 17 13  CREATININE 0.54 0.58  CALCIUM 8.4 8.7   Liver Function Tests:  Recent Labs Lab 10/26/14 1645  AST 22  ALT 12  ALKPHOS 100  BILITOT 0.5  PROT 8.2  ALBUMIN 2.5*   No results for input(s): LIPASE, AMYLASE in the last 168 hours. No results for input(s): AMMONIA in the last 168 hours. CBC:  Recent Labs Lab 10/26/14 1645 10/29/14 1300  WBC 18.9* 11.8*  NEUTROABS 15.5*  --   HGB 9.0* 8.6*  HCT 28.9* 28.4*  MCV 94.4 94.4  PLT 460* 416*   Cardiac Enzymes: No results for input(s): CKTOTAL, CKMB, CKMBINDEX, TROPONINI in the last 168 hours. BNP: BNP (last 3 results)  Recent Labs   06/08/14 0715  PROBNP 899.3*   CBG:  Recent Labs Lab 10/27/14 0006  GLUCAP 94       SignedDebbe Odea, MD [facility] 10/29/2014, 1:33 PM

## 2014-10-30 ENCOUNTER — Encounter: Payer: Self-pay | Admitting: Internal Medicine

## 2014-10-30 ENCOUNTER — Inpatient Hospital Stay (HOSPITAL_COMMUNITY): Payer: Medicare Other

## 2014-10-30 DIAGNOSIS — J9601 Acute respiratory failure with hypoxia: Secondary | ICD-10-CM

## 2014-10-30 LAB — BLOOD GAS, ARTERIAL
ACID-BASE EXCESS: 4.7 mmol/L — AB (ref 0.0–2.0)
Bicarbonate: 28.9 mEq/L — ABNORMAL HIGH (ref 20.0–24.0)
Delivery systems: POSITIVE
Drawn by: 10552
Expiratory PAP: 5
FIO2: 0.3 %
INSPIRATORY PAP: 12
Mode: POSITIVE
O2 Saturation: 94.8 %
PCO2 ART: 44.9 mmHg (ref 35.0–45.0)
PO2 ART: 71.9 mmHg — AB (ref 80.0–100.0)
Patient temperature: 98.6
RATE: 12 resp/min
TCO2: 30.3 mmol/L (ref 0–100)
pH, Arterial: 7.426 (ref 7.350–7.450)

## 2014-10-30 MED ORDER — LORAZEPAM 2 MG/ML IJ SOLN
1.0000 mg | Freq: Once | INTRAMUSCULAR | Status: AC
  Administered 2014-10-30: 1 mg via INTRAVENOUS
  Filled 2014-10-30: qty 1

## 2014-10-30 MED ORDER — FUROSEMIDE 10 MG/ML IJ SOLN
40.0000 mg | Freq: Two times a day (BID) | INTRAMUSCULAR | Status: DC
  Administered 2014-10-30 – 2014-11-02 (×6): 40 mg via INTRAVENOUS
  Filled 2014-10-30 (×7): qty 4

## 2014-10-30 NOTE — Progress Notes (Addendum)
[facility] Progress Note   Dawn [name] TDD:[id number] DOB: 13-Jul-1946 DOA: 10/26/2014 PCP: [name]  Brief narrative: Dawn [name] is a 69 y.o. 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 She was sent from the SNF for dyspnea and cough and was admitted for pneumonia.   Subjective: On BiPAP. Non-verbal. Plan for her was to d/c to nursing home yesterday but she became short of breath again and eventually had severe respiratory distress with RR > 40 and therefore was sent back to SDU and placed on BiPAP.   Assessment/Plan: Principal Problem:   HCAP (healthcare-associated pneumonia)--  Sepsis - cont Zosyn and Vancomycin- increased respiratory distress noted. - suspect she aspirated prior to admission and again yesterday - prognosis is quite poor at this time- despite antibiotics and BiPAP, she is still quite dyspneic this morning  - will repeat CXR today  - obtain ABG  Active Problems:   Seizure disorder - cont Keppra    MR (mental retardation)    Hypothyroidism - cont levothyroxine    Diastolic heart failure, NYHA class 2 - euvolemic    Paroxysmal a-fib - cont Metoprolol -not on anticoagulation    Dysphasia - resumed tube feeds-   Loose stools - c diff negative-    Code Status: DNR Family Communication:  Disposition Plan: return to SNF when stable DVT prophylaxis: heparin  Consultants:   Procedures:   Antibiotics: Anti-infectives    Start     Dose/Rate Route Frequency Ordered Stop   10/29/14 0000  amoxicillin-clavulanate (AUGMENTIN) 500-125 MG per tablet     1 tablet Oral 2 times daily 10/29/14 1333 11/01/14 2359   10/29/14 0000  doxycycline (VIBRA-TABS) 100 MG tablet     100 mg Oral 2 times daily 10/29/14 1333 11/01/14 2359   10/27/14 0600  vancomycin (VANCOCIN) 500 mg in sodium chloride 0.9 % 100 mL IVPB     500 mg100 mL/hr over 60 Minutes Intravenous Every 12  hours 10/26/14 1815     10/27/14 0300  piperacillin-tazobactam (ZOSYN) IVPB 3.375 g     3.375 g12.5 mL/hr over 240 Minutes Intravenous Every 8 hours 10/26/14 1815     10/26/14 1630  piperacillin-tazobactam (ZOSYN) IVPB 3.375 g     3.375 g100 mL/hr over 30 Minutes Intravenous  Once 10/26/14 1625 10/26/14 1732   10/26/14 1630  vancomycin (VANCOCIN) IVPB 1000 mg/200 mL premix     1,000 mg200 mL/hr over 60 Minutes Intravenous  Once 10/26/14 1625 10/26/14 2039         Objective: Filed Weights   10/28/14 0300 10/29/14 0500 10/29/14 0545  Weight: 64.683 kg (142 lb 9.6 oz) 64.8 kg (142 lb 13.7 oz) 64.411 kg (142 lb)    Intake/Output Summary (Last 24 hours) at 10/30/14 1013 Last data filed at 10/30/14 0952  Gross per 24 hour  Intake    195 ml  Output    775 ml  Net   -580 ml     Vitals Filed Vitals:   10/30/14 0418 10/30/14 0559 10/30/14 0700 10/30/14 0806  BP:  145/91 121/75   Pulse: 96 104 103 106  Temp:      TempSrc:      Resp: 39  37 42  Height:      Weight:      SpO2: 100%  100% 99%    Exam: General: + respiratory distress with RR in 40s Lungs: Clear to auscultation bilaterally without wheezes or crackles Cardiovascular:  Regular rate and rhythm without murmur gallop or rub normal S1 and S2 Abdomen: Nontender, nondistended, soft, bowel sounds positive, no rebound, no ascites, no appreciable mass Extremities: No significant cyanosis, clubbing, or edema bilateral lower extremities  Data Reviewed: Basic Metabolic Panel:  Recent Labs Lab 10/26/14 1645 10/28/14 0231  NA 138 144  K 5.1 4.3  CL 103 109  CO2 26 27  GLUCOSE 88 93  BUN 17 13  CREATININE 0.54 0.58  CALCIUM 8.4 8.7   Liver Function Tests:  Recent Labs Lab 10/26/14 1645  AST 22  ALT 12  ALKPHOS 100  BILITOT 0.5  PROT 8.2  ALBUMIN 2.5*   No results for input(s): LIPASE, AMYLASE in the last 168 hours. No results for input(s): AMMONIA in the last 168 hours. CBC:  Recent Labs Lab  10/26/14 1645 10/29/14 1300  WBC 18.9* 11.8*  NEUTROABS 15.5*  --   HGB 9.0* 8.6*  HCT 28.9* 28.4*  MCV 94.4 94.4  PLT 460* 416*   Cardiac Enzymes: No results for input(s): CKTOTAL, CKMB, CKMBINDEX, TROPONINI in the last 168 hours. BNP (last 3 results)  Recent Labs  06/08/14 0715  PROBNP 899.3*   CBG:  Recent Labs Lab 10/27/14 0006  GLUCAP 94    Recent Results (from the past 240 hour(s))  Blood Culture (routine x 2)     Status: None (Preliminary result)   Collection Time: 10/26/14  4:45 PM  Result Value Ref Range Status   Specimen Description BLOOD LEFT HAND  Final   Special Requests BOTTLES DRAWN AEROBIC ONLY 3CC  Final   Culture   Final           BLOOD CULTURE RECEIVED NO GROWTH TO DATE CULTURE WILL BE HELD FOR 5 DAYS BEFORE ISSUING A FINAL NEGATIVE REPORT Performed at [facility]    Report Status PENDING  Incomplete  Blood Culture (routine x 2)     Status: None (Preliminary result)   Collection Time: 10/26/14  4:45 PM  Result Value Ref Range Status   Specimen Description BLOOD LEFT ARM  Final   Special Requests BOTTLES DRAWN AEROBIC ONLY 3CC  Final   Culture   Final           BLOOD CULTURE RECEIVED NO GROWTH TO DATE CULTURE WILL BE HELD FOR 5 DAYS BEFORE ISSUING A FINAL NEGATIVE REPORT Performed at [facility]    Report Status PENDING  Incomplete  Urine culture     Status: None   Collection Time: 10/26/14  5:08 PM  Result Value Ref Range Status   Specimen Description URINE, CATHETERIZED  Final   Special Requests NONE  Final   Colony Count NO GROWTH Performed at [facility]   Final   Culture NO GROWTH Performed at [facility]   Final   Report Status 10/27/2014 FINAL  Final  MRSA PCR Screening     Status: None   Collection Time: 10/26/14 11:11 PM  Result Value Ref Range Status   MRSA by PCR NEGATIVE NEGATIVE Final    Comment:        The GeneXpert MRSA Assay (FDA approved for NASAL specimens only), is one component  of a comprehensive MRSA colonization surveillance program. It is not intended to diagnose MRSA infection nor to guide or monitor treatment for MRSA infections.   Clostridium Difficile by PCR     Status: None   Collection Time: 10/27/14  9:13 PM  Result Value Ref Range Status   C difficile  by pcr NEGATIVE NEGATIVE Final     Studies:  Recent x-ray studies have been reviewed in detail by the Attending Physician  Scheduled Meds:  Scheduled Meds: . docusate  200 mg Per Tube BID  . feeding supplement (JEVITY 1.2 CAL)  237 mL Per Tube BID  . feeding supplement (JEVITY 1.5 CAL/FIBER)  360 mL Per Tube BID  . free water  200 mL Per Tube 6 X Daily  . heparin  5,000 Units Subcutaneous 3 times per day  . levETIRAcetam  500 mg Per Tube Q0600  . levETIRAcetam  750 mg Per Tube 2 times per day  . levothyroxine  100 mcg Per Tube QAC breakfast  . metoprolol tartrate  12.5 mg Per Tube Q8H  . piperacillin-tazobactam (ZOSYN)  IV  3.375 g Intravenous Q8H  . polyethylene glycol  17 g Per Tube Daily  . Valproic Acid  750 mg Per Tube TID  . vancomycin  500 mg Intravenous Q12H   Continuous Infusions:   Time spent on care of this patient: 35 min   [place], MD 10/30/2014, 10:13 AM  LOS: 4 days   [facility] Office  [phone] Pager - Text Page per [email or web address]  If 7PM-7AM, please contact night-coverage [email or web address]

## 2014-10-30 NOTE — Progress Notes (Signed)
This encounter was created in error - please disregard.

## 2014-10-30 NOTE — Progress Notes (Addendum)
ANTIBIOTIC CONSULT NOTE - FOLLOW UP  Pharmacy Consult for Vancomycin and Zosyn Indication: Sepsis / HCAP  Allergies  Allergen Reactions  . Carvedilol   . Ppd [Tuberculin Purified Protein Derivative]     Per [facility]    Patient Measurements: Height: 5\' 3"  (160 cm) Weight: 142 lb (64.411 kg) IBW/kg (Calculated) : 52.4  Vital Signs: Temp: 99.5 F (37.5 C) (01/16 1237) Temp Source: Axillary (01/16 1237) BP: 137/88 mmHg (01/16 1237) Pulse Rate: 118 (01/16 1237) Intake/Output from previous day: 01/15 0701 - 01/16 0700 In: 895 [IV Piggyback:895] Out: 775 [Urine:775] Intake/Output from this shift: Total I/O In: 687 [NG/GT:637; IV Piggyback:50] Out: 250 [Urine:250]  Labs:  Recent Labs  10/28/14 0231 10/29/14 1300  WBC  --  11.8*  HGB  --  8.6*  PLT  --  416*  CREATININE 0.58  --    Estimated Creatinine Clearance: 60.8 mL/min (by C-G formula based on Cr of 0.58). No results for input(s): VANCOTROUGH, VANCOPEAK, VANCORANDOM, GENTTROUGH, GENTPEAK, GENTRANDOM, TOBRATROUGH, TOBRAPEAK, TOBRARND, AMIKACINPEAK, AMIKACINTROU, AMIKACIN in the last 72 hours.   Microbiology: Recent Results (from the past 720 hour(s))  Blood Culture (routine x 2)     Status: None (Preliminary result)   Collection Time: 10/26/14  4:45 PM  Result Value Ref Range Status   Specimen Description BLOOD LEFT HAND  Final   Special Requests BOTTLES DRAWN AEROBIC ONLY 3CC  Final   Culture   Final           BLOOD CULTURE RECEIVED NO GROWTH TO DATE CULTURE WILL BE HELD FOR 5 DAYS BEFORE ISSUING A FINAL NEGATIVE REPORT Performed at [facility]    Report Status PENDING  Incomplete  Blood Culture (routine x 2)     Status: None (Preliminary result)   Collection Time: 10/26/14  4:45 PM  Result Value Ref Range Status   Specimen Description BLOOD LEFT ARM  Final   Special Requests BOTTLES DRAWN AEROBIC ONLY 3CC  Final   Culture   Final           BLOOD CULTURE RECEIVED NO GROWTH TO DATE CULTURE WILL BE HELD  FOR 5 DAYS BEFORE ISSUING A FINAL NEGATIVE REPORT Performed at [facility]    Report Status PENDING  Incomplete  Urine culture     Status: None   Collection Time: 10/26/14  5:08 PM  Result Value Ref Range Status   Specimen Description URINE, CATHETERIZED  Final   Special Requests NONE  Final   Colony Count NO GROWTH Performed at [facility]   Final   Culture NO GROWTH Performed at [facility]   Final   Report Status 10/27/2014 FINAL  Final  MRSA PCR Screening     Status: None   Collection Time: 10/26/14 11:11 PM  Result Value Ref Range Status   MRSA by PCR NEGATIVE NEGATIVE Final    Comment:        The GeneXpert MRSA Assay (FDA approved for NASAL specimens only), is one component of a comprehensive MRSA colonization surveillance program. It is not intended to diagnose MRSA infection nor to guide or monitor treatment for MRSA infections.   Clostridium Difficile by PCR     Status: None   Collection Time: 10/27/14  9:13 PM  Result Value Ref Range Status   C difficile by pcr NEGATIVE NEGATIVE Final    Anti-infectives    Start     Dose/Rate Route Frequency Ordered Stop   10/29/14 0000  amoxicillin-clavulanate (AUGMENTIN) 500-125  MG per tablet     1 tablet Oral 2 times daily 10/29/14 1333 11/01/14 2359   10/29/14 0000  doxycycline (VIBRA-TABS) 100 MG tablet     100 mg Oral 2 times daily 10/29/14 1333 11/01/14 2359   10/27/14 0600  vancomycin (VANCOCIN) 500 mg in sodium chloride 0.9 % 100 mL IVPB     500 mg100 mL/hr over 60 Minutes Intravenous Every 12 hours 10/26/14 1815     10/27/14 0300  piperacillin-tazobactam (ZOSYN) IVPB 3.375 g     3.375 g12.5 mL/hr over 240 Minutes Intravenous Every 8 hours 10/26/14 1815     10/26/14 1630  piperacillin-tazobactam (ZOSYN) IVPB 3.375 g     3.375 g100 mL/hr over 30 Minutes Intravenous  Once 10/26/14 1625 10/26/14 1732   10/26/14 1630  vancomycin (VANCOCIN) IVPB 1000 mg/200 mL premix     1,000 mg200 mL/hr over  60 Minutes Intravenous  Once 10/26/14 1625 10/26/14 2039      Assessment: 69 yo F presents on 1/12 with SOB and cough. Vanc and Zosyn were started for sepsis. Now day #5 for HCAP. Afebrile. WBC down to 11.8. Currently in no respiratory distress. Continue current abx per MD. Once stable pt plans to be d/c's to SNF. CrCl stable around 62ml/min.  Goal of Therapy:  Vancomycin trough level 15-20 mcg/ml  Eradication of infection  Plan:  Continue Vancomycin 500mg  IV Q12 Continue Zosyn 3.375g IV Q8 Check VT tomorrow at 0530 Monitor clinical picture, VT at Css F/U repeat CXR, abx LOT  Ilija Maxim J 10/30/2014,12:44 PM

## 2014-10-30 NOTE — Progress Notes (Signed)
Patient ID: Dawn [name], female   DOB: 07/13/1946, 69 y.o.   MRN: [id number]    this is an acute visit  Level care skilled.  [facility]  The date is 10/26/2013.  Chief complaint --acute visit secondary to change in status-tachycardia-increased moaning altered mental status -. Allergies  Allergen Reactions  . Carvedilol   . Ppd [Tuberculin Purified Protein Derivative]     Per MAR              HPI:  Patient is a 69 y.o. female seen today at [facility] for routine follow up on chronic conditions. Pt has MR, seizures and CHF, dysphagia status post PEG tube, hypothyroidism, a fib, hyperlipidemia. Within the past couple months has been seen due to occipital hematoma and is now s/p I&D.   Apparently this afternoon fairly suddenly patient developed significant tachycardia with a pulse rate during 140-she also was attempting to sit up in bed moaning and groaning-which is unlike her-according to her nurse who saw this morning this is an acute change.  She has been treated recently for pneumonia with Augmentin as well as Atrovent nebulizer Robitussin.  Patient is a phasic and could not give any review of systems she does have a PEG tube     --she appears to have somewhat of a chronically elevated white count was up over 20 recently most recent lab shows has come down to 13.0 which is her baseline recently.  Patient does not appear to be febrile blood pressure stable at 130/70 O2 saturation is 92% on room air  . Pt had workup on tachycardia in October and was started on metoprolol. Was recently increased-   Review of Systems:  Review of Systems  Unable to perform ROS: Patient nonverbal    Past Medical History  Diagnosis Date  . Seizures   . Osteoporosis   . Thyroid disease     hypothyroid  . High cholesterol   . Cataracts, bilateral   . Optic atrophy   . Paraparesis     mild  . Hypothyroidism   . Anginal pain   . Coronary  artery disease    Past Surgical History  Procedure Laterality Date  . Midline incision    . Central venous catheter insertion  05/15/2014       . Esophagogastroduodenoscopy N/A 06/03/2014    Procedure: ESOPHAGOGASTRODUODENOSCOPY (EGD);  Surgeon: Beryle Beams, MD;  Location: [facility] ENDOSCOPY;  Service: Endoscopy;  Laterality: N/A;   Social History:  t she has never smoked. She has never used smokeless tobacco. she does not drink alcohol or use illicit drugs.  Family History  Problem Relation Age of Onset  . Colon cancer Mother   . Diabetes type II Sister     Medications: Patient's Medications  New Prescriptions   No medications on file  Previous Medications   ACETAMINOPHEN (TYLENOL) 160 MG/5ML SOLUTION    Place 20.3 mLs (650 mg total) into feeding tube every 6 (six) hours as needed for mild pain, headache or fever.   ALBUTEROL (PROVENTIL) (2.5 MG/3ML) 0.083% NEBULIZER SOLUTION    Take 3 mLs (2.5 mg total) by nebulization every 6 (six) hours as needed for wheezing or shortness of breath.   ASPIRIN EC 81 MG TABLET    Take 1 tablet (81 mg total) by mouth daily.   ATORVASTATIN (LIPITOR) 10 MG TABLET    Take 10 mg by mouth daily.   BISACODYL (BISAC-EVAC) 10 MG SUPPOSITORY    Place  10 mg rectally daily as needed for moderate constipation (in 24 hrs).   CALCIUM-VITAMIN D (OYSTER CALCIUM 500 + D) 500-200 MG-UNIT PER TABLET    Take 1 tablet by mouth daily.   DOCUSATE (COLACE) 50 MG/5ML LIQUID    Place 20 mLs (200 mg total) into feeding tube 2 (two) times daily.   LEVETIRACETAM (KEPPRA) 100 MG/ML SOLUTION    Place 5 mLs (500 mg total) into feeding tube daily at 6 (six) AM.   LEVETIRACETAM (KEPPRA) 100 MG/ML SOLUTION    750mg  every day at 1400hrs and 2000hrs   LEVOTHYROXINE (SYNTHROID, LEVOTHROID) 100 MCG TABLET    Take 100 mcg by mouth daily before breakfast.   MAGNESIUM HYDROXIDE (MILK OF MAGNESIA PO)    Take 30 mLs by mouth every three (3) days as needed (if no BM for 3 days.).   METOPROLOL  TARTRATE (LOPRESSOR) 25 MG TABLET    Take 12.5 mg by mouth 2 (two) times daily.   NUTRITIONAL SUPPLEMENTS (FEEDING SUPPLEMENT, JEVITY 1.2 CAL,) LIQD    Place 1,000 mLs into feeding tube daily. At 64ml/hr   POLYETHYLENE GLYCOL (MIRALAX / GLYCOLAX) PACKET    Take 17 g by mouth daily.   POLYETHYLENE GLYCOL (MIRALAX / GLYCOLAX) PACKET    Take 17 g by mouth daily.   SODIUM PHOSPHATES (RA SALINE ENEMA RE)    Place 1 application rectally daily as needed (if not relieved by bisacodyl in 24 hrs).   VALPROIC ACID (DEPAKENE) 250 MG/5ML SYRP SYRUP    Place 15 mLs (750 mg total) into feeding tube 3 (three) times daily.   VITAMIN C (ASCORBIC ACID) 500 MG TABLET    Take 500 mg by mouth daily.    WATER FOR IRRIGATION, STERILE (FREE WATER) SOLN    Place 200 mLs into feeding tube every 6 (six) hours.  Modified Medications   No medications on file  Discontinued Medications   No medications on file   Review of systems-essentially unattainable secondary to patient's aphasic status please see history of present illness      Physical Exam  Patient is afebrile pulse 137 respirations 30 blood pressure 130/70 O2 saturation 92% on room air CBG is unremarkable  Constitutional: She appears well-developed and well-nourished. In moderate distress attempting to lean forward morning and groaning at times.  Her skin is warm and dry  HENT:  Head: Normocephalic and atraumatic.  Mouth/Throat: Oropharynx is clear and moist. No oropharyngeal exudate.  Eyes: Conjunctivae are normal.t.  Neck: Normal range of motion. Neck supple.  Cardiovascular tachycardic: Regular rhythm and normal heart sounds.  .   Pulmonary/Chest: Poor respiratory effort-some use of accessory muscles-could not appreciate  overt congestion but again respiratory effort was poor  Abdominal: Soft. Bowel sounds are normal.  PEG intact   Musculoskeletal: She exhibits no edema or tenderness.  Neurological: She is alert. She exhibits abnormal muscle tone.  Coordination abnormal.  continues with baseline facial droop contractures of lower extremities   .    Labs reviewed   10/25/2014.  WBC 13.0 hemoglobin 8.7 platelets 402 : Jan fifth 2015.  WBC 20.8 hemoglobin 9.4 platelets 340.  Sodium 138 potassium 4.2 BUN 26 creatinine 0.6.   Basic Metabolic Panel:  Recent Labs  05/16/14 1503  05/17/14 0611  05/18/14 0342  06/06/14 0545 06/08/14 0715 06/09/14 0557 07/27/14  NA  --   < > 155*  < > 154*  < > 145 144 147 136*  K 2.5*  < > 3.8  < > 3.3*  < >  3.7 3.7 4.0 4.4  CL  --   < > 116*  < > 118*  < > 110 106 110  --   CO2  --   < > 30  < > 27  < > 30 29 29   --   GLUCOSE  --   < > 109*  < > 129*  < > 127* 105* 123*  --   BUN  --   < > 20  < > 18  < > 6 6 6 16   CREATININE  --   < > 1.08  < > 1.10  < > 0.56 0.46* 0.51 0.4*  CALCIUM  --   < > 8.0*  < > 7.4*  < > 7.8* 7.9* 8.0*  --   MG 2.0  --  1.8  --  1.8  --   --   --   --   --   < > = values in this interval not displayed. Liver Function Tests:  Recent Labs  05/23/14 0300 05/30/14 0409 05/31/14 0520  AST 19 17 13   ALT 11 11 9   ALKPHOS 148* 258* 200*  BILITOT 0.2* 0.4 0.3  PROT 6.1 6.4 6.1  ALBUMIN 1.3* 1.3* 1.2*   No results for input(s): LIPASE, AMYLASE in the last 8760 hours. No results for input(s): AMMONIA in the last 8760 hours. CBC:  Recent Labs  05/31/14 0520  06/03/14 0500 06/04/14 0635 06/05/14 0449  06/06/14 0545 06/08/14 0715 07/27/14  WBC 19.3*  < > 19.5* 11.9* 8.2  --  9.4 9.7 12.2  NEUTROABS 12.9*  --  14.6* 8.4*  --   --   --   --   --   HGB 7.4*  < > 7.5* 7.1* 7.0*  < > 10.5* 10.3* 9.3*  HCT 23.1*  < > 22.2* 21.9* 20.8*  < > 30.5* 31.8* 32*  MCV 92.4  < > 91.4 95.2 91.6  --  87.1 91.6  --   PLT 244  < > 250 198 170  --  160 164 298  < > = values in this interval not displayed. TSH:  Recent Labs  12/16/13 0240 06/02/14 0614 07/19/14  TSH 2.495 12.960* 5.50   A1C: No results found for: HGBA1C Lipid Panel:  Recent Labs  07/27/14    CHOL 117  HDL 22*  LDLCALC 72  TRIG 116      Assessment/Plan   #1-change in mental status with tachycardia and labored breathing-concern here for a septic process-this is an acute change from earlier today-patient had been treated for pneumonia-this would be the first suspicion that patient does appear to be somewhat compromised Will to the ER for emergent evaluation.    I did monitor patient serially before EMS arrived she remained relatively unchanged and not appear to be acutely deteriorating from initial presentation    [phone]

## 2014-10-31 ENCOUNTER — Inpatient Hospital Stay (HOSPITAL_COMMUNITY): Payer: Medicare Other

## 2014-10-31 LAB — BASIC METABOLIC PANEL
ANION GAP: 4 — AB (ref 5–15)
BUN: 11 mg/dL (ref 6–23)
CO2: 34 mmol/L — AB (ref 19–32)
Calcium: 8.6 mg/dL (ref 8.4–10.5)
Chloride: 104 mEq/L (ref 96–112)
Creatinine, Ser: 0.56 mg/dL (ref 0.50–1.10)
GFR calc Af Amer: >90 mL/min (ref >90)
GFR calc non Af Amer: >90 mL/min (ref >90)
Glucose, Bld: 89 mg/dL (ref 70–99)
POTASSIUM: 3.9 mmol/L (ref 3.5–5.1)
Sodium: 142 mmol/L (ref 135–145)

## 2014-10-31 LAB — CBC
HEMATOCRIT: 26.1 % — AB (ref 36.0–46.0)
Hemoglobin: 8.2 g/dL — ABNORMAL LOW (ref 12.0–15.0)
MCH: 28.9 pg (ref 26.0–34.0)
MCHC: 31.4 g/dL (ref 30.0–36.0)
MCV: 91.9 fL (ref 78.0–100.0)
Platelets: 388 10*3/uL (ref 150–400)
RBC: 2.84 MIL/uL — ABNORMAL LOW (ref 3.87–5.11)
RDW: 16.9 % — ABNORMAL HIGH (ref 11.5–15.5)
WBC: 15.1 10*3/uL — ABNORMAL HIGH (ref 4.0–10.5)

## 2014-10-31 LAB — VANCOMYCIN, TROUGH: Vancomycin Tr: 8 ug/mL — ABNORMAL LOW (ref 10.0–20.0)

## 2014-10-31 MED ORDER — SODIUM CHLORIDE 0.9 % IV SOLN
500.0000 mg | INTRAVENOUS | Status: AC
  Administered 2014-10-31: 500 mg via INTRAVENOUS
  Filled 2014-10-31: qty 500

## 2014-10-31 MED ORDER — VANCOMYCIN HCL 500 MG IV SOLR
500.0000 mg | Freq: Three times a day (TID) | INTRAVENOUS | Status: DC
  Administered 2014-10-31 – 2014-11-02 (×7): 500 mg via INTRAVENOUS
  Filled 2014-10-31 (×10): qty 500

## 2014-10-31 MED ORDER — FUROSEMIDE 10 MG/ML IJ SOLN
INTRAMUSCULAR | Status: AC
  Filled 2014-10-31: qty 4

## 2014-10-31 NOTE — Progress Notes (Signed)
Patient on Venti Mask 50%, SPO2-98%.BBS-Diminished, Rhonchi.   Mouth care performed to remove thick, yellow secretions, RR-34.  Rt will continue to monitor for BiPap placement.

## 2014-10-31 NOTE — Progress Notes (Signed)
[facility] Progress Note   QUADASIA NEWSHAM IWP:[id number] DOB: [birth year] DOA: 10/26/2014 PCP: [name]  Brief narrative: EVANGELINA [name] is a 69 y.o. 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 She was sent from the SNF for dyspnea and cough and was admitted for pneumonia.   Subjective: On BiPAP. Non-verbal. According to night nurse, she is less responsive today.   Assessment/Plan: Principal Problem:   HCAP (healthcare-associated pneumonia)--  Sepsis - cont Zosyn and Vancomycin-will try to wean to venti-mask today - suspect she aspirated prior to admission and again yesterday - prognosis remains quite poor at this time-  - hold tube feeds for now as further aspiration will lead to rapid decline - Repeat CXR reveals pleural effusions therefore have started Lasix- will cont to follow closely  Active Problems:   Seizure disorder - cont Keppra    MR (mental retardation)    Hypothyroidism - cont levothyroxine    Diastolic heart failure, NYHA class 2 - euvolemic    Paroxysmal a-fib - cont Metoprolol -not on anticoagulation    Dysphasia - hold tube feeds for now   Loose stools - c diff negative-    Code Status: DNR Family Communication: with sister Disposition Plan: return to SNF when stable DVT prophylaxis: heparin  Consultants:   Procedures:   Antibiotics: Anti-infectives    Start     Dose/Rate Route Frequency Ordered Stop   10/31/14 1200  vancomycin (VANCOCIN) 500 mg in sodium chloride 0.9 % 100 mL IVPB     500 mg100 mL/hr over 60 Minutes Intravenous Every 8 hours 10/31/14 0441     10/31/14 0445  vancomycin (VANCOCIN) 500 mg in sodium chloride 0.9 % 100 mL IVPB     500 mg100 mL/hr over 60 Minutes Intravenous NOW 10/31/14 0441 10/31/14 0656   10/29/14 0000  amoxicillin-clavulanate (AUGMENTIN) 500-125 MG per tablet     1 tablet Oral 2 times daily 10/29/14 1333 11/01/14  2359   10/29/14 0000  doxycycline (VIBRA-TABS) 100 MG tablet     100 mg Oral 2 times daily 10/29/14 1333 11/01/14 2359   10/27/14 0600  vancomycin (VANCOCIN) 500 mg in sodium chloride 0.9 % 100 mL IVPB  Status:  Discontinued     500 mg100 mL/hr over 60 Minutes Intravenous Every 12 hours 10/26/14 1815 10/31/14 0441   10/27/14 0300  piperacillin-tazobactam (ZOSYN) IVPB 3.375 g     3.375 g12.5 mL/hr over 240 Minutes Intravenous Every 8 hours 10/26/14 1815     10/26/14 1630  piperacillin-tazobactam (ZOSYN) IVPB 3.375 g     3.375 g100 mL/hr over 30 Minutes Intravenous  Once 10/26/14 1625 10/26/14 1732   10/26/14 1630  vancomycin (VANCOCIN) IVPB 1000 mg/200 mL premix     1,000 mg200 mL/hr over 60 Minutes Intravenous  Once 10/26/14 1625 10/26/14 2039         Objective: Filed Weights   10/29/14 0500 10/29/14 0545 10/31/14 0352  Weight: 64.8 kg (142 lb 13.7 oz) 64.411 kg (142 lb) 67.2 kg (148 lb 2.4 oz)    Intake/Output Summary (Last 24 hours) at 10/31/14 1118 Last data filed at 10/31/14 1020  Gross per 24 hour  Intake    500 ml  Output   1850 ml  Net  -1350 ml     Vitals Filed Vitals:   10/31/14 0500 10/31/14 0510 10/31/14 0729 10/31/14 0756  BP: 117/68 117/68  118/69  Pulse: 99 98 88 90  Temp:  98.3 F (36.8 C)  TempSrc:    Axillary  Resp: 30  36 28  Height:      Weight:      SpO2: 96%  97% 99%    Exam: General: poorly responsive-  Lungs: Clear to auscultation bilaterally without wheezes or crackles Cardiovascular: Regular rate and rhythm without murmur gallop or rub normal S1 and S2 Abdomen: Nontender, nondistended, soft, bowel sounds positive, no rebound, no ascites, no appreciable mass Extremities: No significant cyanosis, clubbing, or edema bilateral lower extremities  Data Reviewed: Basic Metabolic Panel:  Recent Labs Lab 10/26/14 1645 10/28/14 0231 10/31/14 0300  NA 138 144 142  K 5.1 4.3 3.9  CL 103 109 104  CO2 26 27 34*  GLUCOSE 88 93 89  BUN 17 13  11   CREATININE 0.54 0.58 0.56  CALCIUM 8.4 8.7 8.6   Liver Function Tests:  Recent Labs Lab 10/26/14 1645  AST 22  ALT 12  ALKPHOS 100  BILITOT 0.5  PROT 8.2  ALBUMIN 2.5*   No results for input(s): LIPASE, AMYLASE in the last 168 hours. No results for input(s): AMMONIA in the last 168 hours. CBC:  Recent Labs Lab 10/26/14 1645 10/29/14 1300 10/31/14 0300  WBC 18.9* 11.8* 15.1*  NEUTROABS 15.5*  --   --   HGB 9.0* 8.6* 8.2*  HCT 28.9* 28.4* 26.1*  MCV 94.4 94.4 91.9  PLT 460* 416* 388   Cardiac Enzymes: No results for input(s): CKTOTAL, CKMB, CKMBINDEX, TROPONINI in the last 168 hours. BNP (last 3 results)  Recent Labs  06/08/14 0715  PROBNP 899.3*   CBG:  Recent Labs Lab 10/27/14 0006  GLUCAP 94    Recent Results (from the past 240 hour(s))  Blood Culture (routine x 2)     Status: None (Preliminary result)   Collection Time: 10/26/14  4:45 PM  Result Value Ref Range Status   Specimen Description BLOOD LEFT HAND  Final   Special Requests BOTTLES DRAWN AEROBIC ONLY 3CC  Final   Culture   Final           BLOOD CULTURE RECEIVED NO GROWTH TO DATE CULTURE WILL BE HELD FOR 5 DAYS BEFORE ISSUING A FINAL NEGATIVE REPORT Performed at [facility]    Report Status PENDING  Incomplete  Blood Culture (routine x 2)     Status: None (Preliminary result)   Collection Time: 10/26/14  4:45 PM  Result Value Ref Range Status   Specimen Description BLOOD LEFT ARM  Final   Special Requests BOTTLES DRAWN AEROBIC ONLY 3CC  Final   Culture   Final           BLOOD CULTURE RECEIVED NO GROWTH TO DATE CULTURE WILL BE HELD FOR 5 DAYS BEFORE ISSUING A FINAL NEGATIVE REPORT Performed at [facility]    Report Status PENDING  Incomplete  Urine culture     Status: None   Collection Time: 10/26/14  5:08 PM  Result Value Ref Range Status   Specimen Description URINE, CATHETERIZED  Final   Special Requests NONE  Final   Colony Count NO GROWTH Performed at  [facility]   Final   Culture NO GROWTH Performed at [facility]   Final   Report Status 10/27/2014 FINAL  Final  MRSA PCR Screening     Status: None   Collection Time: 10/26/14 11:11 PM  Result Value Ref Range Status   MRSA by PCR NEGATIVE NEGATIVE Final    Comment:  The GeneXpert MRSA Assay (FDA approved for NASAL specimens only), is one component of a comprehensive MRSA colonization surveillance program. It is not intended to diagnose MRSA infection nor to guide or monitor treatment for MRSA infections.   Clostridium Difficile by PCR     Status: None   Collection Time: 10/27/14  9:13 PM  Result Value Ref Range Status   C difficile by pcr NEGATIVE NEGATIVE Final     Studies:  Recent x-ray studies have been reviewed in detail by the Attending Physician  Scheduled Meds:  Scheduled Meds: . docusate  200 mg Per Tube BID  . furosemide  40 mg Intravenous BID  . heparin  5,000 Units Subcutaneous 3 times per day  . levETIRAcetam  500 mg Per Tube Q0600  . levETIRAcetam  750 mg Per Tube 2 times per day  . levothyroxine  100 mcg Per Tube QAC breakfast  . metoprolol tartrate  12.5 mg Per Tube Q8H  . piperacillin-tazobactam (ZOSYN)  IV  3.375 g Intravenous Q8H  . polyethylene glycol  17 g Per Tube Daily  . Valproic Acid  750 mg Per Tube TID  . vancomycin  500 mg Intravenous Q8H   Continuous Infusions:   Time spent on care of this patient: 35 min   [place], MD 10/31/2014, 11:18 AM  LOS: 5 days   [facility] Office  [phone] Pager - Text Page per [email or web address]  If 7PM-7AM, please contact night-coverage [email or web address]

## 2014-10-31 NOTE — Progress Notes (Signed)
This note also relates to the following rows which could not be included: Pulse Rate - Cannot attach notes to unvalidated device data Resp - Cannot attach notes to unvalidated device data SpO2 - Cannot attach notes to unvalidated device data   Placed on 50% venti mask.  Per dr. orders

## 2014-10-31 NOTE — Progress Notes (Signed)
ANTIBIOTIC CONSULT NOTE - FOLLOW UP  Pharmacy Consult for vancomycin Indication: pneumonia  Labs:  Recent Labs  10/29/14 1300 10/31/14 0300  WBC 11.8* 15.1*  HGB 8.6* 8.2*  PLT 416* 388  CREATININE  --  0.56   Estimated Creatinine Clearance: 61.9 mL/min (by C-G formula based on Cr of 0.56).  Recent Labs  10/31/14 0300  VANCOTROUGH 8.0*      Assessment: 69yo female subtherapeutic on vanc with initial dosing for HCAP.  Goal of Therapy:  Vancomycin trough level 15-20 mcg/ml  Plan:  Will change vancomycin to 500mg  IV Q8H and check another level at steady state.  Wynona Neat, PharmD, BCPS  10/31/2014,4:42 AM

## 2014-11-01 LAB — CBC
HEMATOCRIT: 30.6 % — AB (ref 36.0–46.0)
Hemoglobin: 9.4 g/dL — ABNORMAL LOW (ref 12.0–15.0)
MCH: 28.8 pg (ref 26.0–34.0)
MCHC: 30.7 g/dL (ref 30.0–36.0)
MCV: 93.9 fL (ref 78.0–100.0)
Platelets: 343 10*3/uL (ref 150–400)
RBC: 3.26 MIL/uL — ABNORMAL LOW (ref 3.87–5.11)
RDW: 16.9 % — AB (ref 11.5–15.5)
WBC: 17.8 10*3/uL — ABNORMAL HIGH (ref 4.0–10.5)

## 2014-11-01 NOTE — Progress Notes (Signed)
[facility] Progress Note   Dawn [name] HAL:[id number] DOB: Nov 03, 1945 DOA: 10/26/2014 PCP: [name]  Brief narrative: Dawn [name] is a 69 y.o. 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 She was sent from the SNF for dyspnea and cough and was admitted for pneumonia.   Subjective: On BiPAP. Non-verbal. Still poorly responsive.  Assessment/Plan: Principal Problem:   HCAP (healthcare-associated pneumonia)--  Sepsis - cont Zosyn and Vancomycin- have tried to wean to venti-mask today but still having fevers and needing intermittent BiPAP - suspect she aspirated prior to admission and again yesterday - prognosis remains quite poor at this time-  - hold tube feeds for now as further aspiration will lead to rapid decline - Repeat CXR reveals pleural effusions therefore have started Lasix- will cont to follow closely - have discussed poor prognosis with her sister who is aware that she may not recover from this but is not yet willing to consider comfort care for her. At this point she does not feel that she needs to talk to palliative care.  - plan to repeat CXR tomorrow  Active Problems:    Dysphasia - hold tube feeds for now as aspiration again at this point will likely cause further decompensation    Seizure disorder - cont Keppra    MR (mental retardation)    Hypothyroidism - cont levothyroxine    Diastolic heart failure, NYHA class 2 - euvolemic    Paroxysmal a-fib - cont Metoprolol -not on anticoagulation  Loose stools - c diff negative-    Code Status: DNR Family Communication: with sister Disposition Plan: return to SNF when stable DVT prophylaxis: heparin  Consultants:   Procedures:   Antibiotics: Anti-infectives    Start     Dose/Rate Route Frequency Ordered Stop   10/31/14 1200  vancomycin (VANCOCIN) 500 mg in sodium chloride 0.9 % 100 mL IVPB     500 mg100  mL/hr over 60 Minutes Intravenous Every 8 hours 10/31/14 0441     10/31/14 0445  vancomycin (VANCOCIN) 500 mg in sodium chloride 0.9 % 100 mL IVPB     500 mg100 mL/hr over 60 Minutes Intravenous NOW 10/31/14 0441 10/31/14 0656   10/29/14 0000  amoxicillin-clavulanate (AUGMENTIN) 500-125 MG per tablet     1 tablet Oral 2 times daily 10/29/14 1333 11/01/14 2359   10/29/14 0000  doxycycline (VIBRA-TABS) 100 MG tablet     100 mg Oral 2 times daily 10/29/14 1333 11/01/14 2359   10/27/14 0600  vancomycin (VANCOCIN) 500 mg in sodium chloride 0.9 % 100 mL IVPB  Status:  Discontinued     500 mg100 mL/hr over 60 Minutes Intravenous Every 12 hours 10/26/14 1815 10/31/14 0441   10/27/14 0300  piperacillin-tazobactam (ZOSYN) IVPB 3.375 g     3.375 g12.5 mL/hr over 240 Minutes Intravenous Every 8 hours 10/26/14 1815     10/26/14 1630  piperacillin-tazobactam (ZOSYN) IVPB 3.375 g     3.375 g100 mL/hr over 30 Minutes Intravenous  Once 10/26/14 1625 10/26/14 1732   10/26/14 1630  vancomycin (VANCOCIN) IVPB 1000 mg/200 mL premix     1,000 mg200 mL/hr over 60 Minutes Intravenous  Once 10/26/14 1625 10/26/14 2039     Objective: Filed Weights   10/29/14 0545 10/31/14 0352 11/01/14 0408  Weight: 64.411 kg (142 lb) 67.2 kg (148 lb 2.4 oz) 64.7 kg (142 lb 10.2 oz)    Intake/Output Summary (Last 24 hours) at 11/01/14 1021 Last  data filed at 11/01/14 0332  Gross per 24 hour  Intake    250 ml  Output   2325 ml  Net  -2075 ml     Vitals Filed Vitals:   11/01/14 0500 11/01/14 0810 11/01/14 0812 11/01/14 0844  BP: 112/73   95/63  Pulse: 93 85 84 89  Temp:    98.4 F (36.9 C)  TempSrc:    Axillary  Resp: 31 26 20  36  Height:      Weight:      SpO2: 97% 100% 99% 98%    Exam: General: poorly responsive on BiPAP - responds mild to pain   Lungs: Clear to auscultation bilaterally without wheezes or crackles Cardiovascular: Regular rate and rhythm without murmur gallop or rub normal S1 and S2 Abdomen:  Nontender, nondistended, soft, bowel sounds positive, no rebound, no ascites, no appreciable mass Extremities: No significant cyanosis, clubbing, or edema bilateral lower extremities  Data Reviewed: Basic Metabolic Panel:  Recent Labs Lab 10/26/14 1645 10/28/14 0231 10/31/14 0300  NA 138 144 142  K 5.1 4.3 3.9  CL 103 109 104  CO2 26 27 34*  GLUCOSE 88 93 89  BUN 17 13 11   CREATININE 0.54 0.58 0.56  CALCIUM 8.4 8.7 8.6   Liver Function Tests:  Recent Labs Lab 10/26/14 1645  AST 22  ALT 12  ALKPHOS 100  BILITOT 0.5  PROT 8.2  ALBUMIN 2.5*   No results for input(s): LIPASE, AMYLASE in the last 168 hours. No results for input(s): AMMONIA in the last 168 hours. CBC:  Recent Labs Lab 10/26/14 1645 10/29/14 1300 10/31/14 0300  WBC 18.9* 11.8* 15.1*  NEUTROABS 15.5*  --   --   HGB 9.0* 8.6* 8.2*  HCT 28.9* 28.4* 26.1*  MCV 94.4 94.4 91.9  PLT 460* 416* 388   Cardiac Enzymes: No results for input(s): CKTOTAL, CKMB, CKMBINDEX, TROPONINI in the last 168 hours. BNP (last 3 results)  Recent Labs  06/08/14 0715  PROBNP 899.3*   CBG:  Recent Labs Lab 10/27/14 0006  GLUCAP 94    Recent Results (from the past 240 hour(s))  Blood Culture (routine x 2)     Status: None (Preliminary result)   Collection Time: 10/26/14  4:45 PM  Result Value Ref Range Status   Specimen Description BLOOD LEFT HAND  Final   Special Requests BOTTLES DRAWN AEROBIC ONLY 3CC  Final   Culture   Final           BLOOD CULTURE RECEIVED NO GROWTH TO DATE CULTURE WILL BE HELD FOR 5 DAYS BEFORE ISSUING A FINAL NEGATIVE REPORT Performed at [facility]    Report Status PENDING  Incomplete  Blood Culture (routine x 2)     Status: None (Preliminary result)   Collection Time: 10/26/14  4:45 PM  Result Value Ref Range Status   Specimen Description BLOOD LEFT ARM  Final   Special Requests BOTTLES DRAWN AEROBIC ONLY 3CC  Final   Culture   Final           BLOOD CULTURE RECEIVED NO  GROWTH TO DATE CULTURE WILL BE HELD FOR 5 DAYS BEFORE ISSUING A FINAL NEGATIVE REPORT Performed at [facility]    Report Status PENDING  Incomplete  Urine culture     Status: None   Collection Time: 10/26/14  5:08 PM  Result Value Ref Range Status   Specimen Description URINE, CATHETERIZED  Final   Special Requests NONE  Final  Colony Count NO GROWTH Performed at [facility]   Final   Culture NO GROWTH Performed at [facility]   Final   Report Status 10/27/2014 FINAL  Final  MRSA PCR Screening     Status: None   Collection Time: 10/26/14 11:11 PM  Result Value Ref Range Status   MRSA by PCR NEGATIVE NEGATIVE Final    Comment:        The GeneXpert MRSA Assay (FDA approved for NASAL specimens only), is one component of a comprehensive MRSA colonization surveillance program. It is not intended to diagnose MRSA infection nor to guide or monitor treatment for MRSA infections.   Clostridium Difficile by PCR     Status: None   Collection Time: 10/27/14  9:13 PM  Result Value Ref Range Status   C difficile by pcr NEGATIVE NEGATIVE Final     Studies:  Recent x-ray studies have been reviewed in detail by the Attending Physician  Scheduled Meds:  Scheduled Meds: . docusate  200 mg Per Tube BID  . furosemide  40 mg Intravenous BID  . heparin  5,000 Units Subcutaneous 3 times per day  . levETIRAcetam  500 mg Per Tube Q0600  . levETIRAcetam  750 mg Per Tube 2 times per day  . levothyroxine  100 mcg Per Tube QAC breakfast  . metoprolol tartrate  12.5 mg Per Tube Q8H  . piperacillin-tazobactam (ZOSYN)  IV  3.375 g Intravenous Q8H  . polyethylene glycol  17 g Per Tube Daily  . Valproic Acid  750 mg Per Tube TID  . vancomycin  500 mg Intravenous Q8H   Continuous Infusions:   Time spent on care of this patient: 35 min   [place], MD 11/01/2014, 10:21 AM  LOS: 6 days   [facility] Office  [phone] Pager - Text Page per  [email or web address]  If 7PM-7AM, please contact night-coverage [email or web address]

## 2014-11-01 NOTE — Progress Notes (Signed)
Entered room and patient was breathing >40 bpm.  Rt placed patient back on BiPAP.  18/5-rate 16

## 2014-11-01 NOTE — Progress Notes (Signed)
Patient is off from BiPap and placed on Venturi mask.  Tolerating it well at this time.  Os sats between 97-99%.  Respiratory rate between 29 to 36 prior to Venturi mask placement.

## 2014-11-01 NOTE — Clinical Social Work Note (Signed)
CSW spoke with [place] informed that per Dr note patient is still unable to DC today.  CSW will continue to follow.  Domenica Reamer, [place] Social Worker [phone]

## 2014-11-02 ENCOUNTER — Inpatient Hospital Stay
Admission: AD | Admit: 2014-11-02 | Discharge: 2014-11-12 | Disposition: A | Discharge status: Skilled Nursing Facility | Payer: Self-pay | Source: Ambulatory Visit | Attending: Internal Medicine | Admitting: Internal Medicine

## 2014-11-02 ENCOUNTER — Other Ambulatory Visit (HOSPITAL_COMMUNITY): Payer: Self-pay

## 2014-11-02 DIAGNOSIS — Z789 Other specified health status: Secondary | ICD-10-CM

## 2014-11-02 DIAGNOSIS — J80 Acute respiratory distress syndrome: Secondary | ICD-10-CM

## 2014-11-02 DIAGNOSIS — Z95828 Presence of other vascular implants and grafts: Secondary | ICD-10-CM

## 2014-11-02 DIAGNOSIS — J90 Pleural effusion, not elsewhere classified: Secondary | ICD-10-CM

## 2014-11-02 LAB — URINALYSIS, ROUTINE W REFLEX MICROSCOPIC
BILIRUBIN URINE: NEGATIVE
Glucose, UA: NEGATIVE mg/dL
Ketones, ur: NEGATIVE mg/dL
Leukocytes, UA: NEGATIVE
Nitrite: NEGATIVE
Protein, ur: NEGATIVE mg/dL
SPECIFIC GRAVITY, URINE: 1.018 (ref 1.005–1.030)
Urobilinogen, UA: 1 mg/dL (ref 0.0–1.0)
pH: 5.5 (ref 5.0–8.0)

## 2014-11-02 LAB — CULTURE, BLOOD (ROUTINE X 2)
Culture: NO GROWTH
Culture: NO GROWTH

## 2014-11-02 LAB — URINE MICROSCOPIC-ADD ON

## 2014-11-02 LAB — VANCOMYCIN, RANDOM: Vancomycin Rm: 18.5 ug/mL

## 2014-11-02 MED ORDER — PIPERACILLIN-TAZOBACTAM 3.375 G IVPB: 3.3750 g | Freq: Three times a day (TID) | INTRAVENOUS | Status: DC

## 2014-11-02 MED ORDER — VANCOMYCIN HCL 500 MG IV SOLR: 500.0000 mg | Freq: Three times a day (TID) | INTRAVENOUS | Status: DC

## 2014-11-02 NOTE — Care Management Note (Addendum)
    Page 1 of 1   11/02/2014     3:03:40 PM CARE MANAGEMENT NOTE 11/02/2014  Patient:  Dawn [name], Dawn [name]   Account Number:  [id number]  Date Initiated:  10/29/2014  Documentation initiated by:  Magdalen Spatz  Subjective/Objective Assessment:     Action/Plan:   Anticipated DC Date:     Anticipated DC Plan:    In-house referral  Clinical Social Worker         Choice offered to / List presented to:             Status of service:  Completed, signed off Medicare Important Message given?  YES (If response is "NO", the following Medicare IM given date fields will be blank) Date Medicare IM given:  10/29/2014 Medicare IM given by:  Magdalen Spatz Date Additional Medicare IM given:  11/02/2014 Additional Medicare IM given by:  [facility] Axle Parfait  Discharge Disposition:  LONG TERM ACUTE CARE (LTAC)  Per UR Regulation:  Reviewed for med. necessity/level of care/duration of stay  If discussed at Long Length of Stay Meetings, dates discussed:   11/02/2014    Comments:  11/02/13 [place] RN MSN BSN CCM Pt is from [facility], [place] following.  Pt also qualifies for LTAC, MD agrees.  TC to sister, provided information and choice of two facilities.  Sister prefers English as a second language teacher - liaison notified.  11/01/14 [place] RN MSN BSN CCM Transferred to [facility], currently on BiPAP.

## 2014-11-02 NOTE — Progress Notes (Signed)
Discharged to [facility]  Per hospital bed.  [name] Sellars was made aware of this transfer. Report given to Avie Arenas, RN.

## 2014-11-02 NOTE — Discharge Summary (Addendum)
Physician Discharge Summary  Dawn [name] DVV:[id number] DOB: May 23, 1946 DOA: 10/26/2014  PCP: [name]  Admit date: 10/26/2014 Discharge date: 11/02/2014  Time spent: 55 minutes  Recommendations for Outpatient Follow-up:  1. F/u on aspiration issues with resumption of tube feeds 2. Attempt to transition to oral feeds over the next week - will need SLP eval when more alert  Discharge Condition: stable Diet recommendation: continuous tube feeds per nutrition- see below under meds  Discharge Diagnoses:  Principal Problem:   Sepsis Active Problems:   HCAP (healthcare-associated pneumonia)   Acute respiratory distress   Seizure disorder   Hypothyroidism   Anemia of chronic disease   Diastolic heart failure, NYHA class 2   Paroxysmal a-fib   Dysphasia   Development delay   History of present illness:  Dawn [name] is a 69 y.o. 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 The patient presents with complaints of shortness of breath and cough. History was obtained from family member, ED documentation. Patient is a resident of [facility], 10/21/2014 she had cough and congestion and with leukocytosis she was treated with Augmentin for possible pneumonia as well as nebulization and Robitussin. She completed the course but continues to have progressively worsening cough. She was also becoming agitated with fever and tachycardia. At her baseline the patient is nonverbal and does not follow, arms. There was no recent fall reported. As per the family member patient has been having increasing diarrhea over last few days. Patient is receiving Jevity 4 times a day via PEG tube. Patient does not have a Foley catheter placed in the nursing home.  Hospital Course:  Principal Problem:  HCAP- possibly related to aspiration of tube feeds although hard to  confirm-- Sepsis - cont Zosyn and Vancomycin- have weaned to nasal cannula today but WBC rising  - suspect she may have aspirated prior to admission and again 2 days after admission-  - Prognosis appears grim at that time as continued to have respiratory distress requiring BiPAP for 3 days but was able to be weaned to nasal cannula just yesterday.  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- non-verbal and total care - resides in nursing home   Dysphagia  - was started on tube feeds about 4 months ago- sister requesting to start patient on oral feeds again once she is strong enough - will need SLP eval  - held tube feeds while she had severe resp distress as further aspiration would have resulted in a rapid decline - resuming tube feeds today   Seizure disorder - cont Keppra   Hypothyroidism - cont levothyroxine   Diastolic heart failure, NYHA class 2 - euvolemic now- given Lasix x 2 days as mentioned above- not on daily lasix as outpt    Paroxysmal a-fib - cont Metoprolol for rate control -not on anticoagulation  Loose stools - c diff negative- likely as a result of tube feeds  Procedures:  none (i.e. Studies not automatically included, echos, thoracentesis, etc; not x-rays)  Consultations:  none  Discharge Exam: Filed Weights   10/29/14 0545 10/31/14 0352 11/01/14 0408  Weight:  64.411 kg (142 lb) 67.2 kg (148 lb 2.4 oz) 64.7 kg (142 lb 10.2 oz)   Filed Vitals:   11/02/14 0836  BP: 107/62  Pulse: 83  Temp: 98.3 F (36.8 C)  Resp: 27    General: alert, nonverbal at baseline- does not follow commands-  no distress Cardiovascular: RRR, no murmurs  Respiratory: clear to  auscultation bilaterally GI: soft, non-tender, non-distended, bowel sound positive  Discharge Instructions You were cared for by a hospitalist during your hospital stay. If you have any questions about your discharge medications or the care you received while you were in the hospital after you are discharged, you can call the unit and asked to speak with the hospitalist on call if the hospitalist that took care of you is not available. Once you are discharged, your primary care physician will handle any further medical issues. Please note that NO REFILLS for any discharge medications will be authorized once you are discharged, as it is imperative that you return to your primary care physician (or establish a relationship with a primary care physician if you do not have one) for your aftercare needs so that they can reassess your need for medications and monitor your lab values.      Discharge Instructions    Increase activity slowly    Complete by:  As directed             Medication List    STOP taking these medications        amoxicillin-clavulanate 500-125 MG per tablet  Commonly known as:  AUGMENTIN     polyethylene glycol packet  Commonly known as:  MIRALAX / GLYCOLAX      TAKE these medications        acetaminophen 160 MG/5ML solution  Commonly known as:  TYLENOL  Place 20.3 mLs (650 mg total) into feeding tube every 6 (six) hours as needed for mild pain, headache or fever.     albuterol (2.5 MG/3ML) 0.083% nebulizer solution  Commonly known as:  PROVENTIL  Take 3 mLs (2.5 mg total) by nebulization every 6 (six) hours as needed for wheezing or shortness of breath.     aspirin EC 81 MG tablet  Take 1 tablet (81 mg total) by mouth daily.     atorvastatin 10 MG tablet  Commonly known as:  LIPITOR  10 mg by PEG Tube route daily.     BISAC-EVAC 10 MG suppository  Generic drug:  bisacodyl  Place 10 mg rectally daily as needed for moderate constipation (in 24 hrs).      docusate 50 MG/5ML liquid  Commonly known as:  COLACE  Place 20 mLs (200 mg total) into feeding tube 2 (two) times daily.     feeding supplement (JEVITY 1.2 CAL) Liqd  Place 1,000 mLs into feeding tube daily. At 71ml/hr     feeding supplement (JEVITY 1.2 CAL) Liqd  Place 237 mLs into feeding tube 2 (two) times daily.     feeding supplement (JEVITY 1.5 CAL/FIBER) Liqd  Place 360 mLs into feeding tube 2 (two) times daily.     free water Soln  Place 200 mLs into feeding tube every 6 (six) hours.     ipratropium 0.02 % nebulizer solution  Commonly known as:  ATROVENT  Take 0.5 mg by nebulization every 6 (six) hours as needed for wheezing or shortness of breath.     levETIRAcetam 100 MG/ML solution  Commonly known as:  KEPPRA  Place 5 mLs (500 mg total)  into feeding tube daily at 6 (six) AM.     levETIRAcetam 100 MG/ML solution  Commonly known as:  KEPPRA  750mg  every day at 1400hrs and 2000hrs     levothyroxine 100 MCG tablet  Commonly known as:  SYNTHROID, LEVOTHROID  Take 100 mcg by mouth daily before breakfast.     metoprolol tartrate 25 MG tablet  Commonly known as:  LOPRESSOR  Take 12.5 mg by mouth 2 (two) times daily.     MILK OF MAGNESIA PO  Take 30 mLs by mouth every three (3) days as needed (if no BM for 3 days.).     OYSTER CALCIUM 500 + D 500-200 MG-UNIT per tablet  Generic drug:  calcium-vitamin D  Take 1 tablet by mouth daily.     piperacillin-tazobactam 3.375 GM/50ML IVPB  Commonly known as:  ZOSYN  Inject 50 mLs (3.375 g total) into the vein every 8 (eight) hours.     potassium chloride 20 MEQ/15ML (10%) Soln  Take 10 mEq by mouth daily. End date 10/25/14     RA SALINE ENEMA RE  Place 1 application rectally daily as needed (if not relieved by bisacodyl in 24 hrs).     TUSSIN 100 MG/5ML syrup  Generic drug:  guaifenesin  Take 200 mg by mouth every 6 (six) hours as needed for cough.     Valproic Acid 250 MG/5ML Syrp syrup  Commonly known as:  DEPAKENE   Place 15 mLs (750 mg total) into feeding tube 3 (three) times daily.     vancomycin 500 mg in sodium chloride 0.9 % 100 mL  Inject 500 mg into the vein every 8 (eight) hours.     vitamin C 500 MG tablet  Commonly known as:  ASCORBIC ACID  Take 500 mg by mouth daily.       Allergies  Allergen Reactions  . Carvedilol   . Ppd [Tuberculin Purified Protein Derivative]     Per MAR      The results of significant diagnostics from this hospitalization (including imaging, microbiology, ancillary and laboratory) are listed below for reference.    Significant Diagnostic Studies: Dg Chest Port 1 View  10/30/2014   CLINICAL DATA:  69 year old with hypoxia.  EXAM: PORTABLE CHEST - 1 VIEW  COMPARISON:  10/28/2014  FINDINGS: Again noted are low lung volumes with diffuse lung densities suggesting airspace disease. In addition, there is evidence for bilateral pleural effusions, right side greater the left. Heart size is within normal limits and stable. Surgical clips in the right abdomen.  IMPRESSION: Bilateral pleural effusions and suspect bilateral airspace disease. Minimal change from the previous examination.   Electronically Signed   By: Markus Daft M.D.   On: 10/30/2014 10:51   Dg Chest Port 1 View  10/28/2014   CLINICAL DATA:  Subsequent evaluation of pneumonia right lung base, patient unresponsive  EXAM: PORTABLE CHEST - 1 VIEW  COMPARISON:  10/26/2014  FINDINGS: Limited study. Significant cardiac enlargement stable. Extensive hazy density over both lung bases suggest increasing bilateral pleural effusions. Underlying airspace disease also suspected which could be due to atelectasis or pneumonia.  IMPRESSION: Bilateral pleural effusions. Bibasilar airspace disease could be due to atelectasis or pneumonia. The degree of bibasilar opacity is greater than it was on the prior study.   Electronically Signed   By: Skipper Cliche M.D.   On: 10/28/2014 14:55   Dg Chest Port 1 View  10/26/2014    CLINICAL DATA:  Shortness of breath, cough, fever.  EXAM: PORTABLE CHEST - 1 VIEW  COMPARISON:  August 03, 2014.  FINDINGS: Stable cardiomediastinal silhouette. Hypoinflation of the lungs is noted. Mild central pulmonary vascular congestion is noted. Moderate right basilar opacity is noted consistent with pneumonia or atelectasis with associated pleural effusion. Patient is rotated to the right.  IMPRESSION: Mild central pulmonary vascular congestion. Moderate right basilar opacity consistent with pneumonia or atelectasis with associated pleural effusion.   Electronically Signed   By: Sabino Dick M.D.   On: 10/26/2014 18:51   Dg Abd Portable 1v  10/31/2014   CLINICAL DATA:  Abdominal distention.  EXAM: PORTABLE ABDOMEN - 1 VIEW  COMPARISON:  10/26/2014.  FINDINGS: The patient was unable to cooperate for positioning, with resultant significant patient rotation to the right. Grossly normal bowel gas pattern. Bilateral abdominal surgical clips. A PEG tube appears to be in satisfactory position. Unremarkable bones.  IMPRESSION: No acute abnormality.   Electronically Signed   By: Enrique Sack M.D.   On: 10/31/2014 11:47   Dg Abd Portable 2v  10/26/2014   CLINICAL DATA:  Abdominal discomfort, absent bowel sounds. History paraplegia. Fever intact cardiac  EXAM: PORTABLE ABDOMEN - 2 VIEW  COMPARISON:  Chest radiograph earlier this day.  FINDINGS: Gastrostomy tube in the upper abdomen. There are surgical clips in the right upper quadrant. No dilated small bowel loops to suggest obstruction. There is stool throughout the ascending, transverse, and sigmoid colon. There is no free air. Lower pelvis not included in the field of view. Right pleural effusion is noted.  IMPRESSION: Nonobstructive bowel gas pattern.  No free air.   Electronically Signed   By: Jeb Levering M.D.   On: 10/26/2014 21:34    Microbiology: Recent Results (from the past 240 hour(s))  Blood Culture (routine x 2)     Status: None   Collection  Time: 10/26/14  4:45 PM  Result Value Ref Range Status   Specimen Description BLOOD LEFT HAND  Final   Special Requests BOTTLES DRAWN AEROBIC ONLY 3CC  Final   Culture   Final    NO GROWTH 5 DAYS Performed at [facility]    Report Status 11/02/2014 FINAL  Final  Blood Culture (routine x 2)     Status: None   Collection Time: 10/26/14  4:45 PM  Result Value Ref Range Status   Specimen Description BLOOD LEFT ARM  Final   Special Requests BOTTLES DRAWN AEROBIC ONLY 3CC  Final   Culture   Final    NO GROWTH 5 DAYS Performed at [facility]    Report Status 11/02/2014 FINAL  Final  Urine culture     Status: None   Collection Time: 10/26/14  5:08 PM  Result Value Ref Range Status   Specimen Description URINE, CATHETERIZED  Final   Special Requests NONE  Final   Colony Count NO GROWTH Performed at [facility]   Final   Culture NO GROWTH Performed at [facility]   Final   Report Status 10/27/2014 FINAL  Final  MRSA PCR Screening     Status: None   Collection Time: 10/26/14 11:11 PM  Result Value Ref Range Status   MRSA by PCR NEGATIVE NEGATIVE Final    Comment:        The GeneXpert MRSA Assay (FDA approved for NASAL specimens only), is one component of a comprehensive MRSA colonization surveillance program. It is not intended to diagnose MRSA infection nor to guide or monitor treatment for MRSA infections.  Clostridium Difficile by PCR     Status: None   Collection Time: 10/27/14  9:13 PM  Result Value Ref Range Status   C difficile by pcr NEGATIVE NEGATIVE Final     Labs: Basic Metabolic Panel:  Recent Labs Lab 10/26/14 1645 10/28/14 0231 10/31/14 0300  NA 138 144 142  K 5.1 4.3 3.9  CL 103 109 104  CO2 26 27 34*  GLUCOSE 88 93 89  BUN 17 13 11   CREATININE 0.54 0.58 0.56  CALCIUM 8.4 8.7 8.6   Liver Function Tests:  Recent Labs Lab 10/26/14 1645  AST 22  ALT 12  ALKPHOS 100  BILITOT 0.5  PROT 8.2  ALBUMIN 2.5*    No results for input(s): LIPASE, AMYLASE in the last 168 hours. No results for input(s): AMMONIA in the last 168 hours. CBC:  Recent Labs Lab 10/26/14 1645 10/29/14 1300 10/31/14 0300 11/01/14 1121  WBC 18.9* 11.8* 15.1* 17.8*  NEUTROABS 15.5*  --   --   --   HGB 9.0* 8.6* 8.2* 9.4*  HCT 28.9* 28.4* 26.1* 30.6*  MCV 94.4 94.4 91.9 93.9  PLT 460* 416* 388 343   Cardiac Enzymes: No results for input(s): CKTOTAL, CKMB, CKMBINDEX, TROPONINI in the last 168 hours. BNP: BNP (last 3 results)  Recent Labs  06/08/14 0715  PROBNP 899.3*   CBG:  Recent Labs Lab 10/27/14 0006  GLUCAP 94       SignedDebbe Odea, MD [facility] 11/02/2014, 11:54 AM

## 2014-11-03 ENCOUNTER — Other Ambulatory Visit (HOSPITAL_COMMUNITY): Payer: Self-pay

## 2014-11-03 LAB — CBC WITH DIFFERENTIAL/PLATELET
BASOS ABS: 0 10*3/uL (ref 0.0–0.1)
Basophils Relative: 0 % (ref 0–1)
Eosinophils Absolute: 0 10*3/uL (ref 0.0–0.7)
Eosinophils Relative: 0 % (ref 0–5)
HCT: 25.9 % — ABNORMAL LOW (ref 36.0–46.0)
HEMOGLOBIN: 7.9 g/dL — AB (ref 12.0–15.0)
LYMPHS PCT: 15 % (ref 12–46)
Lymphs Abs: 1.3 10*3/uL (ref 0.7–4.0)
MCH: 28.4 pg (ref 26.0–34.0)
MCHC: 30.5 g/dL (ref 30.0–36.0)
MCV: 93.2 fL (ref 78.0–100.0)
MONO ABS: 0.5 10*3/uL (ref 0.1–1.0)
Monocytes Relative: 6 % (ref 3–12)
NEUTROS ABS: 6.9 10*3/uL (ref 1.7–7.7)
Neutrophils Relative %: 79 % — ABNORMAL HIGH (ref 43–77)
Platelets: 341 10*3/uL (ref 150–400)
RBC: 2.78 MIL/uL — ABNORMAL LOW (ref 3.87–5.11)
RDW: 16.5 % — ABNORMAL HIGH (ref 11.5–15.5)
WBC: 8.7 10*3/uL (ref 4.0–10.5)

## 2014-11-03 LAB — URINE CULTURE
CULTURE: NO GROWTH
Colony Count: NO GROWTH

## 2014-11-03 LAB — PROCALCITONIN: PROCALCITONIN: 0.21 ng/mL

## 2014-11-03 LAB — FERRITIN: Ferritin: 935 ng/mL — ABNORMAL HIGH (ref 10–291)

## 2014-11-03 LAB — T4, FREE: Free T4: 1.04 ng/dL (ref 0.80–1.80)

## 2014-11-03 LAB — COMPREHENSIVE METABOLIC PANEL
ALBUMIN: 1.9 g/dL — AB (ref 3.5–5.2)
ALT: 9 U/L (ref 0–35)
ANION GAP: 11 (ref 5–15)
AST: 24 U/L (ref 0–37)
Alkaline Phosphatase: 72 U/L (ref 39–117)
BUN: 18 mg/dL (ref 6–23)
CALCIUM: 8.9 mg/dL (ref 8.4–10.5)
CO2: 38 mmol/L — ABNORMAL HIGH (ref 19–32)
Chloride: 100 mEq/L (ref 96–112)
Creatinine, Ser: 0.58 mg/dL (ref 0.50–1.10)
GFR calc Af Amer: >90 mL/min (ref >90)
GFR calc non Af Amer: >90 mL/min (ref >90)
Glucose, Bld: 96 mg/dL (ref 70–99)
POTASSIUM: 3.1 mmol/L — AB (ref 3.5–5.1)
Sodium: 149 mmol/L — ABNORMAL HIGH (ref 135–145)
TOTAL PROTEIN: 8.1 g/dL (ref 6.0–8.3)
Total Bilirubin: 0.6 mg/dL (ref 0.3–1.2)

## 2014-11-03 LAB — PHOSPHORUS: Phosphorus: 3.1 mg/dL (ref 2.3–4.6)

## 2014-11-03 LAB — VALPROIC ACID LEVEL: VALPROIC ACID LVL: 60.9 ug/mL (ref 50.0–100.0)

## 2014-11-03 LAB — IRON AND TIBC
IRON: 18 ug/dL — AB (ref 42–145)
Saturation Ratios: 10 % — ABNORMAL LOW (ref 20–55)
TIBC: 179 ug/dL — ABNORMAL LOW (ref 250–470)
UIBC: 161 ug/dL (ref 125–400)

## 2014-11-03 LAB — PROTIME-INR
INR: 1.25 (ref 0.00–1.49)
Prothrombin Time: 15.8 seconds — ABNORMAL HIGH (ref 11.6–15.2)

## 2014-11-03 LAB — VITAMIN B12: VITAMIN B 12: 942 pg/mL — AB (ref 211–911)

## 2014-11-03 LAB — POTASSIUM: Potassium: 3.3 mmol/L — ABNORMAL LOW (ref 3.5–5.1)

## 2014-11-03 LAB — MAGNESIUM: Magnesium: 2.1 mg/dL (ref 1.5–2.5)

## 2014-11-03 LAB — TSH: TSH: 4.287 u[IU]/mL (ref 0.350–4.500)

## 2014-11-04 LAB — BASIC METABOLIC PANEL
Anion gap: 7 (ref 5–15)
BUN: 18 mg/dL (ref 6–23)
CALCIUM: 8.9 mg/dL (ref 8.4–10.5)
CO2: 40 mmol/L (ref 19–32)
CREATININE: 0.61 mg/dL (ref 0.50–1.10)
Chloride: 101 mEq/L (ref 96–112)
GFR calc non Af Amer: >90 mL/min (ref >90)
Glucose, Bld: 103 mg/dL — ABNORMAL HIGH (ref 70–99)
Potassium: 3.2 mmol/L — ABNORMAL LOW (ref 3.5–5.1)
Sodium: 148 mmol/L — ABNORMAL HIGH (ref 135–145)

## 2014-11-04 LAB — CBC WITH DIFFERENTIAL/PLATELET
Basophils Absolute: 0 10*3/uL (ref 0.0–0.1)
Basophils Relative: 0 % (ref 0–1)
Eosinophils Absolute: 0 10*3/uL (ref 0.0–0.7)
Eosinophils Relative: 0 % (ref 0–5)
HCT: 27.3 % — ABNORMAL LOW (ref 36.0–46.0)
HEMOGLOBIN: 8.4 g/dL — AB (ref 12.0–15.0)
Lymphocytes Relative: 20 % (ref 12–46)
Lymphs Abs: 1.8 10*3/uL (ref 0.7–4.0)
MCH: 29.3 pg (ref 26.0–34.0)
MCHC: 30.8 g/dL (ref 30.0–36.0)
MCV: 95.1 fL (ref 78.0–100.0)
Monocytes Absolute: 0.7 10*3/uL (ref 0.1–1.0)
Monocytes Relative: 8 % (ref 3–12)
NEUTROS PCT: 72 % (ref 43–77)
Neutro Abs: 6.8 10*3/uL (ref 1.7–7.7)
PLATELETS: 330 10*3/uL (ref 150–400)
RBC: 2.87 MIL/uL — ABNORMAL LOW (ref 3.87–5.11)
RDW: 16.7 % — ABNORMAL HIGH (ref 11.5–15.5)
WBC: 9.4 10*3/uL (ref 4.0–10.5)

## 2014-11-04 LAB — FOLATE RBC
FOLATE, HEMOLYSATE: 576.6 ng/mL
Folate, RBC: 2270 ng/mL (ref >498)
Hematocrit: 25.4 % — ABNORMAL LOW (ref 34.0–46.6)

## 2014-11-04 LAB — LACTATE DEHYDROGENASE: LDH: 157 U/L (ref 94–250)

## 2014-11-05 ENCOUNTER — Other Ambulatory Visit (HOSPITAL_COMMUNITY): Payer: Self-pay

## 2014-11-05 LAB — BASIC METABOLIC PANEL
ANION GAP: 5 (ref 5–15)
BUN: 16 mg/dL (ref 6–23)
CHLORIDE: 101 meq/L (ref 96–112)
CO2: 41 mmol/L (ref 19–32)
CREATININE: 0.59 mg/dL (ref 0.50–1.10)
Calcium: 8.6 mg/dL (ref 8.4–10.5)
GFR calc Af Amer: >90 mL/min (ref >90)
Glucose, Bld: 116 mg/dL — ABNORMAL HIGH (ref 70–99)
POTASSIUM: 2.8 mmol/L — AB (ref 3.5–5.1)
Sodium: 147 mmol/L — ABNORMAL HIGH (ref 135–145)

## 2014-11-06 ENCOUNTER — Other Ambulatory Visit (HOSPITAL_COMMUNITY): Payer: Self-pay

## 2014-11-06 LAB — CBC WITH DIFFERENTIAL/PLATELET
BASOS ABS: 0 10*3/uL (ref 0.0–0.1)
Basophils Relative: 0 % (ref 0–1)
Eosinophils Absolute: 0 10*3/uL (ref 0.0–0.7)
Eosinophils Relative: 0 % (ref 0–5)
HEMATOCRIT: 26.7 % — AB (ref 36.0–46.0)
HEMOGLOBIN: 8.1 g/dL — AB (ref 12.0–15.0)
Lymphocytes Relative: 26 % (ref 12–46)
Lymphs Abs: 2.4 10*3/uL (ref 0.7–4.0)
MCH: 28.8 pg (ref 26.0–34.0)
MCHC: 30.3 g/dL (ref 30.0–36.0)
MCV: 95 fL (ref 78.0–100.0)
MONO ABS: 0.6 10*3/uL (ref 0.1–1.0)
MONOS PCT: 6 % (ref 3–12)
NEUTROS ABS: 6.2 10*3/uL (ref 1.7–7.7)
NEUTROS PCT: 68 % (ref 43–77)
PLATELETS: 263 10*3/uL (ref 150–400)
RBC: 2.81 MIL/uL — ABNORMAL LOW (ref 3.87–5.11)
RDW: 17 % — ABNORMAL HIGH (ref 11.5–15.5)
WBC: 9.2 10*3/uL (ref 4.0–10.5)

## 2014-11-06 LAB — BODY FLUID CELL COUNT WITH DIFFERENTIAL
LYMPHS FL: 20 %
Monocyte-Macrophage-Serous Fluid: 66 % (ref 50–90)
NEUTROPHIL FLUID: 14 % (ref 0–25)
Total Nucleated Cell Count, Fluid: 313 cu mm (ref 0–1000)

## 2014-11-06 LAB — BASIC METABOLIC PANEL
Anion gap: 8 (ref 5–15)
BUN: 14 mg/dL (ref 6–23)
CALCIUM: 8.6 mg/dL (ref 8.4–10.5)
CHLORIDE: 107 mmol/L (ref 96–112)
CO2: 33 mmol/L — AB (ref 19–32)
Creatinine, Ser: 0.67 mg/dL (ref 0.50–1.10)
GFR calc Af Amer: >90 mL/min (ref >90)
GFR calc non Af Amer: 88 mL/min — ABNORMAL LOW (ref >90)
GLUCOSE: 151 mg/dL — AB (ref 70–99)
Potassium: 3.7 mmol/L (ref 3.5–5.1)
Sodium: 148 mmol/L — ABNORMAL HIGH (ref 135–145)

## 2014-11-06 MED ORDER — LIDOCAINE HCL (PF) 1 % IJ SOLN
INTRAMUSCULAR | Status: AC
  Filled 2014-11-06: qty 10

## 2014-11-06 NOTE — Procedures (Signed)
Successful US guided right thoracentesis. Yielded 817mL of amber fluid. Pt tolerated procedure well. No immediate complications.  Specimen was sent for labs. CXR ordered.  Ascencion Dike PA-C 11/06/2014 11:47 AM

## 2014-11-07 LAB — BASIC METABOLIC PANEL
Anion gap: 7 (ref 5–15)
BUN: 12 mg/dL (ref 6–23)
CALCIUM: 8.7 mg/dL (ref 8.4–10.5)
CHLORIDE: 105 mmol/L (ref 96–112)
CO2: 33 mmol/L — ABNORMAL HIGH (ref 19–32)
Creatinine, Ser: 0.63 mg/dL (ref 0.50–1.10)
Glucose, Bld: 127 mg/dL — ABNORMAL HIGH (ref 70–99)
Potassium: 3.7 mmol/L (ref 3.5–5.1)
SODIUM: 145 mmol/L (ref 135–145)

## 2014-11-08 LAB — COMPREHENSIVE METABOLIC PANEL
ALBUMIN: 1.8 g/dL — AB (ref 3.5–5.2)
ALK PHOS: 86 U/L (ref 39–117)
ALT: 8 U/L (ref 0–35)
ANION GAP: 11 (ref 5–15)
AST: 21 U/L (ref 0–37)
BUN: 8 mg/dL (ref 6–23)
CHLORIDE: 102 mmol/L (ref 96–112)
CO2: 30 mmol/L (ref 19–32)
CREATININE: 0.49 mg/dL — AB (ref 0.50–1.10)
Calcium: 8.9 mg/dL (ref 8.4–10.5)
GFR calc Af Amer: >90 mL/min (ref >90)
GFR calc non Af Amer: >90 mL/min (ref >90)
Glucose, Bld: 106 mg/dL — ABNORMAL HIGH (ref 70–99)
POTASSIUM: 3.8 mmol/L (ref 3.5–5.1)
Sodium: 143 mmol/L (ref 135–145)
Total Bilirubin: 0.4 mg/dL (ref 0.3–1.2)
Total Protein: 7.3 g/dL (ref 6.0–8.3)

## 2014-11-08 LAB — CBC
HEMATOCRIT: 28.4 % — AB (ref 36.0–46.0)
HEMOGLOBIN: 8.7 g/dL — AB (ref 12.0–15.0)
MCH: 29.4 pg (ref 26.0–34.0)
MCHC: 30.6 g/dL (ref 30.0–36.0)
MCV: 95.9 fL (ref 78.0–100.0)
PLATELETS: 210 10*3/uL (ref 150–400)
RBC: 2.96 MIL/uL — ABNORMAL LOW (ref 3.87–5.11)
RDW: 17.6 % — AB (ref 11.5–15.5)
WBC: 10.6 10*3/uL — ABNORMAL HIGH (ref 4.0–10.5)

## 2014-11-08 LAB — VANCOMYCIN, TROUGH: Vancomycin Tr: <3.5 ug/mL — ABNORMAL LOW (ref 10.0–20.0)

## 2014-11-11 ENCOUNTER — Other Ambulatory Visit (HOSPITAL_COMMUNITY): Payer: Medicare Other

## 2014-11-11 LAB — BASIC METABOLIC PANEL
Anion gap: 7 (ref 5–15)
BUN: 9 mg/dL (ref 6–23)
CO2: 32 mmol/L (ref 19–32)
CREATININE: 0.5 mg/dL (ref 0.50–1.10)
Calcium: 8.9 mg/dL (ref 8.4–10.5)
Chloride: 98 mmol/L (ref 96–112)
GFR calc Af Amer: >90 mL/min (ref >90)
GLUCOSE: 110 mg/dL — AB (ref 70–99)
Potassium: 4 mmol/L (ref 3.5–5.1)
SODIUM: 137 mmol/L (ref 135–145)

## 2014-11-11 LAB — CBC
HCT: 30.6 % — ABNORMAL LOW (ref 36.0–46.0)
Hemoglobin: 9.9 g/dL — ABNORMAL LOW (ref 12.0–15.0)
MCH: 29.6 pg (ref 26.0–34.0)
MCHC: 32.4 g/dL (ref 30.0–36.0)
MCV: 91.6 fL (ref 78.0–100.0)
PLATELETS: UNDETERMINED 10*3/uL (ref 150–400)
RBC: 3.34 MIL/uL — ABNORMAL LOW (ref 3.87–5.11)
RDW: 18.1 % — AB (ref 11.5–15.5)
WBC: 10.2 10*3/uL (ref 4.0–10.5)

## 2014-11-12 LAB — BASIC METABOLIC PANEL
Anion gap: 5 (ref 5–15)
BUN: 9 mg/dL (ref 6–23)
CO2: 32 mmol/L (ref 19–32)
Calcium: 8.8 mg/dL (ref 8.4–10.5)
Chloride: 97 mmol/L (ref 96–112)
Creatinine, Ser: 0.54 mg/dL (ref 0.50–1.10)
GFR calc Af Amer: >90 mL/min (ref >90)
GFR calc non Af Amer: >90 mL/min (ref >90)
Glucose, Bld: 124 mg/dL — ABNORMAL HIGH (ref 70–99)
POTASSIUM: 4.4 mmol/L (ref 3.5–5.1)
SODIUM: 134 mmol/L — AB (ref 135–145)

## 2014-11-12 LAB — CBC WITH DIFFERENTIAL/PLATELET
BASOS ABS: 0 10*3/uL (ref 0.0–0.1)
Basophils Relative: 0 % (ref 0–1)
Eosinophils Absolute: 0 10*3/uL (ref 0.0–0.7)
Eosinophils Relative: 0 % (ref 0–5)
HEMATOCRIT: 29.6 % — AB (ref 36.0–46.0)
HEMOGLOBIN: 9.5 g/dL — AB (ref 12.0–15.0)
LYMPHS PCT: 21 % (ref 12–46)
Lymphs Abs: 2.7 10*3/uL (ref 0.7–4.0)
MCH: 30 pg (ref 26.0–34.0)
MCHC: 32.1 g/dL (ref 30.0–36.0)
MCV: 93.4 fL (ref 78.0–100.0)
Monocytes Absolute: 1.7 10*3/uL — ABNORMAL HIGH (ref 0.1–1.0)
Monocytes Relative: 13 % — ABNORMAL HIGH (ref 3–12)
NEUTROS PCT: 66 % (ref 43–77)
Neutro Abs: 8.2 10*3/uL — ABNORMAL HIGH (ref 1.7–7.7)
Platelets: 449 10*3/uL — ABNORMAL HIGH (ref 150–400)
RBC: 3.17 MIL/uL — ABNORMAL LOW (ref 3.87–5.11)
RDW: 18.3 % — ABNORMAL HIGH (ref 11.5–15.5)
WBC: 12.7 10*3/uL — AB (ref 4.0–10.5)

## 2014-11-12 LAB — PHOSPHORUS: PHOSPHORUS: 3.4 mg/dL (ref 2.3–4.6)

## 2014-11-12 LAB — MAGNESIUM: Magnesium: 1.9 mg/dL (ref 1.5–2.5)

## 2014-11-15 ENCOUNTER — Non-Acute Institutional Stay (SKILLED_NURSING_FACILITY): Payer: Medicare Other | Admitting: Internal Medicine

## 2014-11-15 DIAGNOSIS — E034 Atrophy of thyroid (acquired): Secondary | ICD-10-CM

## 2014-11-15 DIAGNOSIS — Z931 Gastrostomy status: Secondary | ICD-10-CM

## 2014-11-15 DIAGNOSIS — I5030 Unspecified diastolic (congestive) heart failure: Secondary | ICD-10-CM

## 2014-11-15 DIAGNOSIS — R1310 Dysphagia, unspecified: Secondary | ICD-10-CM

## 2014-11-15 DIAGNOSIS — F0390 Unspecified dementia without behavioral disturbance: Secondary | ICD-10-CM

## 2014-11-15 DIAGNOSIS — R531 Weakness: Secondary | ICD-10-CM

## 2014-11-15 DIAGNOSIS — G40909 Epilepsy, unspecified, not intractable, without status epilepticus: Secondary | ICD-10-CM

## 2014-11-15 DIAGNOSIS — J9601 Acute respiratory failure with hypoxia: Secondary | ICD-10-CM

## 2014-11-15 DIAGNOSIS — J189 Pneumonia, unspecified organism: Secondary | ICD-10-CM

## 2014-11-15 DIAGNOSIS — E038 Other specified hypothyroidism: Secondary | ICD-10-CM

## 2014-11-15 DIAGNOSIS — I480 Paroxysmal atrial fibrillation: Secondary | ICD-10-CM

## 2014-11-15 DIAGNOSIS — D638 Anemia in other chronic diseases classified elsewhere: Secondary | ICD-10-CM

## 2014-11-15 NOTE — Progress Notes (Signed)
MRN: [id number] Name: Dawn [name]  Sex: female Age: 69 y.o. DOB: 03/24/1946  [place] #: [name] Facility/Room: 205 Level Of Care: SNF Provider: Inocencio Homes D Emergency Contacts: Extended Emergency Contact Information Primary Emergency Contact: Sellars,[name] Address: [address]          [place] [address] [place] Phone: [phone] Relation: Sister Secondary Emergency Contact: Tabor Cower [place] Phone: [phone] Relation: None  Code Status: FULL  Allergies: Carvedilol and Ppd  Chief Complaint  Patient presents with  . New Admit To SNF    HPI: Patient is 69 y.o. female who can't move or respond, with PEG, hosp for respiratory failure 2/2 aspiration PNA who is admitted to SNF as residence.  Past Medical History  Diagnosis Date  . Seizures   . Osteoporosis   . Thyroid disease     hypothyroid  . High cholesterol   . Cataracts, bilateral   . Optic atrophy   . Paraparesis     mild  . Hypothyroidism   . Anginal pain   . Coronary artery disease     Past Surgical History  Procedure Laterality Date  . Midline incision    . Central venous catheter insertion  05/15/2014       . Esophagogastroduodenoscopy N/A 06/03/2014    Procedure: ESOPHAGOGASTRODUODENOSCOPY (EGD);  Surgeon: Beryle Beams, MD;  Location: [facility] ENDOSCOPY;  Service: Endoscopy;  Laterality: N/A;      Medication List       This list is accurate as of: 11/15/14 11:59 PM.  Always use your most recent med list.               bacitracin 500 UNIT/GM ointment  Apply 1 application topically 2 (two) times daily.     CENTAMIN Liqd  Place 5 mLs into feeding tube daily.     docusate 50 MG/5ML liquid  Commonly known as:  COLACE  Place 20 mLs (200 mg total) into feeding tube 2 (two) times daily.     famotidine 40 MG/5ML suspension  Commonly known as:  PEPCID  Place 20 mg into feeding tube daily.     furosemide 10 MG/ML solution  Commonly  known as:  LASIX  Place 20 mg into feeding tube 2 (two) times daily.     heparin 5000 UNIT/ML injection  Inject into the skin every 8 (eight) hours.     levETIRAcetam 100 MG/ML solution  Commonly known as:  KEPPRA  Place 5 mLs (500 mg total) into feeding tube daily at 6 (six) AM.     levETIRAcetam 100 MG/ML solution  Commonly known as:  KEPPRA  750mg  every day at 1400hrs and 2000hrs     levothyroxine 100 MCG tablet  Commonly known as:  SYNTHROID, LEVOTHROID  Take 100 mcg by mouth daily before breakfast.     metoprolol tartrate 25 mg/10 mL Susp  Commonly known as:  LOPRESSOR  Take 12.5 mg by mouth 3 (three) times daily.     polyethylene glycol packet  Commonly known as:  MIRALAX / GLYCOLAX  Take 17 g by mouth daily.     Valproic Acid 250 MG/5ML Syrp syrup  Commonly known as:  DEPAKENE  Place 15 mLs (750 mg total) into feeding tube 3 (three) times daily.        Meds ordered this encounter  Medications  . metoprolol tartrate (LOPRESSOR) 25 mg/10 mL SUSP    Sig: Take 12.5 mg by mouth 3 (three) times daily.  Marland Kitchen  bacitracin 500 UNIT/GM ointment    Sig: Apply 1 application topically 2 (two) times daily.  . Multiple Vitamins-Minerals (CENTAMIN) LIQD    Sig: Place 5 mLs into feeding tube daily.  . famotidine (PEPCID) 40 MG/5ML suspension    Sig: Place 20 mg into feeding tube daily.  . heparin 5000 UNIT/ML injection    Sig: Inject into the skin every 8 (eight) hours.  . polyethylene glycol (MIRALAX / GLYCOLAX) packet    Sig: Take 17 g by mouth daily.  . furosemide (LASIX) 10 MG/ML solution    Sig: Place 20 mg into feeding tube 2 (two) times daily.    Immunization History  Administered Date(s) Administered  . Influenza-Unspecified 08/13/2014    History  Substance Use Topics  . Smoking status: Never Smoker   . Smokeless tobacco: Never Used  . Alcohol Use: No    Family history is noncontributory    Review of Systems   UTO ;nurse voices no concerns   Filed Vitals:    11/15/14 1413  BP: 117/63  Pulse: 97  Resp: 99    Physical Exam  GENERAL APPEARANCE: eyes are open, does not respond ; BF SKIN: No diaphoresis rash HEAD: Normocephalic, atraumatic  EYES: Conjunctiva/lids clear. Pupils round, reactive  EARS: External exam WNL, canals clear  NOSE: No deformity or discharge.  MOUTH/THROAT: Lips w/o lesions  RESPIRATORY: Breathing is even, unlabored. Lung sounds are clear   CARDIOVASCULAR: Heart RRR no murmurs, rubs or gallops. No peripheral edema.   GASTROINTESTINAL: Abdomen is soft, non-tender, not distended w/ normal bowel sounds;PEG tube GENITOURINARY: Bladder non tender, not distended  MUSCULOSKELETAL: contractures NEUROLOGIC:  Cranial nerves 2-10 grossly intact; pt doesn't move  PSYCHIATRIC: doesn't exist  Patient Active Problem List   Diagnosis Date Noted  . Acute respiratory failure with hypoxia 11/20/2014  . Generalized weakness 11/20/2014  . Dysphagia 11/20/2014  . Dementia without behavioral disturbance 11/20/2014  . Development delay 11/02/2014  . Acute respiratory distress 11/02/2014  . Paroxysmal ventricular tachycardia 10/30/2014  . HCAP (healthcare-associated pneumonia) 10/26/2014  . Hematoma of right parietal scalp 08/13/2014  . Edema 08/09/2014  . Hematoma 08/05/2014  . Fall at nursing home 08/05/2014  . Leukocytosis 07/26/2014  . Tachycardia 07/11/2014  . Leukocytopenia, unspecified 07/11/2014  . Pneumonia 06/17/2014  . Sepsis 06/12/2014  . Facial droop 06/12/2014  . Hypernatremia 06/12/2014  . PEG (percutaneous endoscopic gastrostomy) status 06/12/2014  . FUO (fever of unknown origin) 06/02/2014  . Paroxysmal a-fib 05/14/2014  . Dysphasia 05/14/2014  . UTI (lower urinary tract infection) 05/14/2014  . Tooth decay 05/14/2014  . SIRS (systemic inflammatory response syndrome) 12/15/2013  . Thrombocytopenia 10/29/2013  . Hypotension 10/29/2013  . Anemia of chronic disease 10/29/2013  . Diastolic heart failure, NYHA  class 2 10/29/2013  . Atrial fibrillation with rapid ventricular response 10/28/2013  . Seizure disorder 10/28/2013  . Hypothyroidism 10/28/2013  . HLD (hyperlipidemia) 10/28/2013  . Non-ST elevation myocardial infarction (NSTEMI) 10/28/2013    CBC    Component Value Date/Time   WBC 12.7* 11/12/2014 0614   WBC 12.2 07/27/2014   RBC 3.17* 11/12/2014 0614   RBC 1.74* 05/15/2014 0740   HGB 9.5* 11/12/2014 0614   HCT 29.6* 11/12/2014 0614   HCT 25.4* 11/03/2014 0725   PLT 449* 11/12/2014 0614   MCV 93.4 11/12/2014 0614   LYMPHSABS 2.7 11/12/2014 0614   MONOABS 1.7* 11/12/2014 0614   EOSABS 0.0 11/12/2014 0614   BASOSABS 0.0 11/12/2014 0614    CMP  Component Value Date/Time   NA 134* 11/12/2014 0614   NA 136* 07/27/2014   K 4.4 11/12/2014 0614   CL 97 11/12/2014 0614   CO2 32 11/12/2014 0614   GLUCOSE 124* 11/12/2014 0614   BUN 9 11/12/2014 0614   BUN 16 07/27/2014   CREATININE 0.54 11/12/2014 0614   CREATININE 0.4* 07/27/2014   CALCIUM 8.8 11/12/2014 0614   PROT 7.3 11/08/2014 0600   ALBUMIN 1.8* 11/08/2014 0600   AST 21 11/08/2014 0600   ALT 8 11/08/2014 0600   ALKPHOS 86 11/08/2014 0600   BILITOT 0.4 11/08/2014 0600   GFRNONAA >90 11/12/2014 0614   GFRAA >90 11/12/2014 0614    Assessment and Plan  Acute respiratory failure with hypoxia Pt was tx at [facility] then [facility] for ARF 2/2 probable aspiration PNA that has not improved after Augmentin at SNF; Pt was maintained on 2L [place] while bing tx with IV abx at [facility]   HCAP (healthcare-associated pneumonia) Felt to be aspiration ;Tx with vancomycin and Zocyn IV I think for 14 days   Seizure disorder Pt was continues on Keppra and Valproic acid and did not have any seizures during hospitalization   Dysphagia Pt with PEG tune placed 06/2014; 2 cal at 35 cc/hr   Hypothyroidism Pt on synthroid 100 mcg daily with TSH 4 and free T4 5.63   Diastolic heart failure, NYHA class 2 Chronic, NYClass 2; cont lasix and  Bblocker   Paroxysmal a-fib Cont lopressor for rate   Dementia without behavioral disturbance Imposed on MR with very poor quality of life when PEG tube placed in 06/2014; difficult to rehab minimally responsive pt but it has been suggested   Anemia of chronic disease D/c 9.5/29.6; will monitor with scheduled q 6 mo blood draws and prn     Hennie Duos, MD

## 2014-11-20 ENCOUNTER — Encounter: Payer: Self-pay | Admitting: Internal Medicine

## 2014-11-20 NOTE — Assessment & Plan Note (Signed)
Felt to be aspiration ;Tx with vancomycin and Zocyn IV I think for 14 days

## 2014-11-20 NOTE — Assessment & Plan Note (Signed)
Pt was tx at [facility] then [facility] for ARF 2/2 probable aspiration PNA that has not improved after Augmentin at SNF; Pt was maintained on 2L [place] while bing tx with IV abx at [facility]

## 2014-11-20 NOTE — Assessment & Plan Note (Signed)
Pt was continues on Keppra and Valproic acid and did not have any seizures during hospitalization

## 2014-11-20 NOTE — Assessment & Plan Note (Signed)
Imposed on MR with very poor quality of life when PEG tube placed in 06/2014; difficult to rehab minimally responsive pt but it has been suggested

## 2014-11-20 NOTE — Assessment & Plan Note (Signed)
Cont lopressor for rate

## 2014-11-20 NOTE — Assessment & Plan Note (Signed)
D/c 9.5/29.6; will monitor with scheduled q 6 mo blood draws and prn

## 2014-11-20 NOTE — Assessment & Plan Note (Signed)
Pt with PEG tune placed 06/2014; 2 cal at 35 cc/hr

## 2014-11-20 NOTE — Assessment & Plan Note (Signed)
Pt on synthroid 100 mcg daily with TSH 4 and free T4 1.04

## 2014-11-20 NOTE — Assessment & Plan Note (Signed)
Chronic, NYClass 2; cont lasix and Bblocker

## 2014-11-24 ENCOUNTER — Non-Acute Institutional Stay (SKILLED_NURSING_FACILITY): Payer: Medicare Other | Admitting: Internal Medicine

## 2014-11-24 ENCOUNTER — Encounter: Payer: Self-pay | Admitting: Internal Medicine

## 2014-11-24 DIAGNOSIS — R451 Restlessness and agitation: Secondary | ICD-10-CM

## 2014-11-24 NOTE — Progress Notes (Signed)
MRN: [id number] Name: Dawn [name]  Sex: female Age: 69 y.o. DOB: 1946-03-19  [place] #: [name] Facility/Room: 205W Level Of Care: SNF Provider: Inocencio Homes D Emergency Contacts: Extended Emergency Contact Information Primary Emergency Contact: Sellars,[name] Address: [place]          [place] [address] [place] Phone: [phone] Relation: Sister Secondary Emergency Contact: Yaelis Cower [place] Phone: [phone] Relation: None  Code Status: FULl  Allergies: Carvedilol and Ppd  Chief Complaint  Patient presents with  . Acute Visit    HPI: Patient is 69 y.o. female who nursing asked me to see for agitation for several days, described as constant movement, squirming in chair.  Past Medical History  Diagnosis Date  . Seizures   . Osteoporosis   . Thyroid disease     hypothyroid  . High cholesterol   . Cataracts, bilateral   . Optic atrophy   . Paraparesis     mild  . Hypothyroidism   . Anginal pain   . Coronary artery disease     Past Surgical History  Procedure Laterality Date  . Midline incision    . Central venous catheter insertion  05/15/2014       . Esophagogastroduodenoscopy N/A 06/03/2014    Procedure: ESOPHAGOGASTRODUODENOSCOPY (EGD);  Surgeon: Beryle Beams, MD;  Location: [facility] ENDOSCOPY;  Service: Endoscopy;  Laterality: N/A;      Medication List       This list is accurate as of: 11/24/14 11:38 AM.  Always use your most recent med list.               bacitracin 500 UNIT/GM ointment  Apply 1 application topically 2 (two) times daily.     CENTAMIN Liqd  Place 5 mLs into feeding tube daily.     docusate 50 MG/5ML liquid  Commonly known as:  COLACE  Place 20 mLs (200 mg total) into feeding tube 2 (two) times daily.     famotidine 40 MG/5ML suspension  Commonly known as:  PEPCID  Place 20 mg into feeding tube daily.     furosemide 10 MG/ML solution  Commonly known as:  LASIX   Place 20 mg into feeding tube 2 (two) times daily.     heparin 5000 UNIT/ML injection  Inject into the skin every 8 (eight) hours.     levETIRAcetam 100 MG/ML solution  Commonly known as:  KEPPRA  Place 5 mLs (500 mg total) into feeding tube daily at 6 (six) AM.     levETIRAcetam 100 MG/ML solution  Commonly known as:  KEPPRA  750mg  every day at 1400hrs and 2000hrs     levothyroxine 100 MCG tablet  Commonly known as:  SYNTHROID, LEVOTHROID  Take 100 mcg by mouth daily before breakfast.     metoprolol tartrate 25 mg/10 mL Susp  Commonly known as:  LOPRESSOR  Take 12.5 mg by mouth 3 (three) times daily.     polyethylene glycol packet  Commonly known as:  MIRALAX / GLYCOLAX  Take 17 g by mouth daily.     Valproic Acid 250 MG/5ML Syrp syrup  Commonly known as:  DEPAKENE  Place 15 mLs (750 mg total) into feeding tube 3 (three) times daily.        No orders of the defined types were placed in this encounter.    Immunization History  Administered Date(s) Administered  . Influenza-Unspecified 08/13/2014    History  Substance Use Topics  .  Smoking status: Never Smoker   . Smokeless tobacco: Never Used  . Alcohol Use: No    Review of Systems  UTO ; nusrse -per HPI    Filed Vitals:   11/24/14 1135  BP: 109/66  Pulse: 97  Temp: 97.1 F (36.2 C)  Resp: 18    Physical Exam  GENERAL APPEARANCE: Alert,non conversant ; I watched pt for 5 minutes, changing position, pulling on PEG tube SKIN: No diaphoresis rash HEENT: Unremarkable RESPIRATORY: Breathing is even, unlabored. Lung sounds are clear   CARDIOVASCULAR: Heart RRR no murmurs, rubs or gallops. No peripheral edema  GASTROINTESTINAL: Abdomen is soft, non-tender, not distended w/ normal bowel sounds.  GENITOURINARY: Bladder non tender, not distended  MUSCULOSKELETAL: contractures NEUROLOGIC: Cranial nerves 2-12 grossly intact PSYCHIATRIC: MR profound  Patient Active Problem List   Diagnosis Date Noted  .  Acute respiratory failure with hypoxia 11/20/2014  . Generalized weakness 11/20/2014  . Dysphagia 11/20/2014  . Dementia without behavioral disturbance 11/20/2014  . Development delay 11/02/2014  . Acute respiratory distress 11/02/2014  . Paroxysmal ventricular tachycardia 10/30/2014  . HCAP (healthcare-associated pneumonia) 10/26/2014  . Hematoma of right parietal scalp 08/13/2014  . Edema 08/09/2014  . Hematoma 08/05/2014  . Fall at nursing home 08/05/2014  . Leukocytosis 07/26/2014  . Tachycardia 07/11/2014  . Leukocytopenia, unspecified 07/11/2014  . Pneumonia 06/17/2014  . Sepsis 06/12/2014  . Facial droop 06/12/2014  . Hypernatremia 06/12/2014  . PEG (percutaneous endoscopic gastrostomy) status 06/12/2014  . FUO (fever of unknown origin) 06/02/2014  . Paroxysmal a-fib 05/14/2014  . Dysphasia 05/14/2014  . UTI (lower urinary tract infection) 05/14/2014  . Tooth decay 05/14/2014  . SIRS (systemic inflammatory response syndrome) 12/15/2013  . Thrombocytopenia 10/29/2013  . Hypotension 10/29/2013  . Anemia of chronic disease 10/29/2013  . Diastolic heart failure, NYHA class 2 10/29/2013  . Atrial fibrillation with rapid ventricular response 10/28/2013  . Seizure disorder 10/28/2013  . Hypothyroidism 10/28/2013  . HLD (hyperlipidemia) 10/28/2013  . Non-ST elevation myocardial infarction (NSTEMI) 10/28/2013    CBC    Component Value Date/Time   WBC 12.7* 11/12/2014 0614   WBC 12.2 07/27/2014   RBC 3.17* 11/12/2014 0614   RBC 1.74* 05/15/2014 0740   HGB 9.5* 11/12/2014 0614   HCT 29.6* 11/12/2014 0614   HCT 25.4* 11/03/2014 0725   PLT 449* 11/12/2014 0614   MCV 93.4 11/12/2014 0614   LYMPHSABS 2.7 11/12/2014 0614   MONOABS 1.7* 11/12/2014 0614   EOSABS 0.0 11/12/2014 0614   BASOSABS 0.0 11/12/2014 0614    CMP     Component Value Date/Time   NA 134* 11/12/2014 0614   NA 136* 07/27/2014   K 4.4 11/12/2014 0614   CL 97 11/12/2014 0614   CO2 32 11/12/2014 0614    GLUCOSE 124* 11/12/2014 0614   BUN 9 11/12/2014 0614   BUN 16 07/27/2014   CREATININE 0.54 11/12/2014 0614   CREATININE 0.4* 07/27/2014   CALCIUM 8.8 11/12/2014 0614   PROT 7.3 11/08/2014 0600   ALBUMIN 1.8* 11/08/2014 0600   AST 21 11/08/2014 0600   ALT 8 11/08/2014 0600   ALKPHOS 86 11/08/2014 0600   BILITOT 0.4 11/08/2014 0600   GFRNONAA >90 11/12/2014 0614   GFRAA >90 11/12/2014 0614    Assessment and Plan  AGITATION -what I saw is best described as agitation. More to the point, I don't think she is in pain, she doesn't look in distress; I wish I could use an antipsychotic which has no respiratory depression.  Start Ativan 1 mg per tube  q6 prn  Hennie Duos, MD

## 2014-12-13 ENCOUNTER — Ambulatory Visit (HOSPITAL_COMMUNITY)
Admission: RE | Admit: 2014-12-13 | Discharge: 2014-12-13 | Disposition: A | Discharge status: Home / Self Care | Payer: Medicare Other | Source: Ambulatory Visit | Attending: Interventional Radiology | Admitting: Interventional Radiology

## 2014-12-13 ENCOUNTER — Other Ambulatory Visit (HOSPITAL_COMMUNITY): Payer: Self-pay | Admitting: Interventional Radiology

## 2014-12-13 DIAGNOSIS — R633 Feeding difficulties, unspecified: Secondary | ICD-10-CM

## 2014-12-13 DIAGNOSIS — Z431 Encounter for attention to gastrostomy: Secondary | ICD-10-CM | POA: Insufficient documentation

## 2014-12-13 MED ORDER — IOHEXOL 300 MG/ML  SOLN
50.0000 mL | Freq: Once | INTRAMUSCULAR | Status: AC | PRN
  Administered 2014-12-13: 10 mL

## 2014-12-13 NOTE — Procedures (Signed)
Gtube patent. No leak.  No comp

## 2014-12-24 ENCOUNTER — Encounter: Payer: Self-pay | Admitting: Internal Medicine

## 2014-12-24 ENCOUNTER — Non-Acute Institutional Stay (SKILLED_NURSING_FACILITY): Payer: Medicare Other | Admitting: Internal Medicine

## 2014-12-24 DIAGNOSIS — Z931 Gastrostomy status: Secondary | ICD-10-CM

## 2014-12-24 DIAGNOSIS — E034 Atrophy of thyroid (acquired): Secondary | ICD-10-CM | POA: Diagnosis not present

## 2014-12-24 DIAGNOSIS — E785 Hyperlipidemia, unspecified: Secondary | ICD-10-CM

## 2014-12-24 DIAGNOSIS — R450 Nervousness: Secondary | ICD-10-CM | POA: Diagnosis not present

## 2014-12-24 DIAGNOSIS — G2571 Drug induced akathisia: Secondary | ICD-10-CM

## 2014-12-24 DIAGNOSIS — I5030 Unspecified diastolic (congestive) heart failure: Secondary | ICD-10-CM | POA: Diagnosis not present

## 2014-12-24 DIAGNOSIS — E038 Other specified hypothyroidism: Secondary | ICD-10-CM

## 2014-12-24 DIAGNOSIS — E870 Hyperosmolality and hypernatremia: Secondary | ICD-10-CM | POA: Diagnosis not present

## 2014-12-24 DIAGNOSIS — G40909 Epilepsy, unspecified, not intractable, without status epilepticus: Secondary | ICD-10-CM

## 2014-12-24 NOTE — Assessment & Plan Note (Signed)
Recently developed;has been treated with clonazepam scheduled ans it is better but still there and sometimes pt moves so much she can fall;could be related to CP;Plan- outpt neurology consult

## 2014-12-24 NOTE — Assessment & Plan Note (Signed)
In 07/2014 LDL 72. HDL 22 on no meds;Plan - check yearly

## 2014-12-24 NOTE — Assessment & Plan Note (Signed)
Recent PEG tube malfunction,not working;hub was fixed;Plan - continue PEG tube feedings

## 2014-12-24 NOTE — Assessment & Plan Note (Signed)
No seizure activity since hospitalization;continue keppra and vaproic acid

## 2014-12-24 NOTE — Assessment & Plan Note (Signed)
Pt's Na+ ha been table since at least 07/2014 -138, 08/27/2014 -136, 10/19/2014- 138;Plan - continue to monitor

## 2014-12-24 NOTE — Assessment & Plan Note (Signed)
Synthroid at 100 mcg for > 6 we3ks, time to get TSH;Plan - draw TSH lab

## 2014-12-24 NOTE — Progress Notes (Signed)
MRN: [id number] Name: Dawn [name]  Sex: female Age: 69 y.o. DOB: 05-06-1946  [place] #: [name] Facility/Room:205W Level Of Care: SNF Provider: Inocencio Homes D Emergency Contacts: Extended Emergency Contact Information Primary Emergency Contact: Sellars,[name] Address: [place]          [place] [address] [place] Phone: [phone] Relation: Sister Secondary Emergency Contact: Lashonda Cower [place] Phone: [phone] Relation: None  Code Status: DNR  Allergies: Carvedilol and Ppd  Chief Complaint  Patient presents with  . Medical Management of Chronic Issues    HPI: Patient is 69 y.o. female who is being seen for routine issues.  Past Medical History  Diagnosis Date  . Seizures   . Osteoporosis   . Thyroid disease     hypothyroid  . High cholesterol   . Cataracts, bilateral   . Optic atrophy   . Paraparesis     mild  . Hypothyroidism   . Anginal pain   . Coronary artery disease     Past Surgical History  Procedure Laterality Date  . Midline incision    . Central venous catheter insertion  05/15/2014       . Esophagogastroduodenoscopy N/A 06/03/2014    Procedure: ESOPHAGOGASTRODUODENOSCOPY (EGD);  Surgeon: Beryle Beams, MD;  Location: [facility] ENDOSCOPY;  Service: Endoscopy;  Laterality: N/A;      Medication List       This list is accurate as of: 12/24/14  4:27 PM.  Always use your most recent med list.               bacitracin 500 UNIT/GM ointment  Apply 1 application topically 2 (two) times daily.     CENTAMIN Liqd  Place 5 mLs into feeding tube daily.     docusate 50 MG/5ML liquid  Commonly known as:  COLACE  Place 20 mLs (200 mg total) into feeding tube 2 (two) times daily.     famotidine 40 MG/5ML suspension  Commonly known as:  PEPCID  Place 20 mg into feeding tube daily.     furosemide 10 MG/ML solution  Commonly known as:  LASIX  Place 20 mg into feeding tube 2 (two) times  daily.     heparin 5000 UNIT/ML injection  Inject into the skin every 8 (eight) hours.     levETIRAcetam 100 MG/ML solution  Commonly known as:  KEPPRA  Place 5 mLs (500 mg total) into feeding tube daily at 6 (six) AM.     levETIRAcetam 100 MG/ML solution  Commonly known as:  KEPPRA  750mg  every day at 1400hrs and 2000hrs     levothyroxine 100 MCG tablet  Commonly known as:  SYNTHROID, LEVOTHROID  Take 100 mcg by mouth daily before breakfast.     metoprolol tartrate 25 mg/10 mL Susp  Commonly known as:  LOPRESSOR  Take 12.5 mg by mouth 3 (three) times daily.     polyethylene glycol packet  Commonly known as:  MIRALAX / GLYCOLAX  Take 17 g by mouth daily.     Valproic Acid 250 MG/5ML Syrp syrup  Commonly known as:  DEPAKENE  Place 15 mLs (750 mg total) into feeding tube 3 (three) times daily.        No orders of the defined types were placed in this encounter.    Immunization History  Administered Date(s) Administered  . Influenza-Unspecified 08/13/2014    History  Substance Use Topics  . Smoking status: Never Smoker   .  Smokeless tobacco: Never Used  . Alcohol Use: No    Review of Systems -  UTO from pt; nursing pt's movements are better but still there    Filed Vitals:   12/24/14 1556  BP: 109/66  Pulse: 72  Temp: 98 F (36.7 C)  Resp: 18    Physical Exam  GENERAL APPEARANCE: Alert, nonconversant, No acute distress  SKIN: No diaphoresis rash HEENT: Unremarkable RESPIRATORY: Breathing is even, unlabored. Lung sounds are clear   CARDIOVASCULAR: Heart RRR no murmurs, rubs or gallops. No peripheral edema  GASTROINTESTINAL: Abdomen is soft, non-tender, not distended w/ normal bowel sounds.  GENITOURINARY: Bladder non tender, not distended  MUSCULOSKELETAL:serebral palsey NEUROLOGIC: Cranial nerves 2-12 grossly intact; on and off restless, purposeless movement of body and upper extremity PSYCHIATRIC: MR  Patient Active Problem List   Diagnosis Date  Noted  . 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 Acute respiratory failure with hypoxia 11/20/2014  . Generalized weakness 11/20/2014  . Dysphagia 11/20/2014  . Dementia without behavioral disturbance 11/20/2014  . Development delay 11/02/2014  . Acute respiratory distress 11/02/2014  . Paroxysmal ventricular tachycardia 10/30/2014  . HCAP (healthcare-associated pneumonia) 10/26/2014  . Hematoma of right parietal scalp 08/13/2014  . Edema 08/09/2014  . Hematoma 08/05/2014  . Fall at nursing home 08/05/2014  . Leukocytosis 07/26/2014  . Tachycardia 07/11/2014  . Leukocytopenia, unspecified 07/11/2014  . Pneumonia 06/17/2014  . Sepsis 06/12/2014  . Facial droop 06/12/2014  . Hypernatremia 06/12/2014  . PEG (percutaneous endoscopic gastrostomy) status 06/12/2014  . FUO (fever of unknown origin) 06/02/2014  . Paroxysmal a-fib 05/14/2014  . Dysphasia 05/14/2014  . UTI (lower urinary tract infection) 05/14/2014  . Tooth decay 05/14/2014  . SIRS (systemic inflammatory response syndrome) 12/15/2013  . Thrombocytopenia 10/29/2013  . Hypotension 10/29/2013  . Anemia of chronic disease 10/29/2013  . Diastolic heart failure, NYHA class 2 10/29/2013  . Atrial fibrillation with rapid ventricular response 10/28/2013  . Seizure disorder 10/28/2013  . Hypothyroidism 10/28/2013  . HLD (hyperlipidemia) 10/28/2013  . Non-ST elevation myocardial infarction (NSTEMI) 10/28/2013    CBC    Component Value Date/Time   WBC 12.7* 11/12/2014 0614   WBC 12.2 07/27/2014   RBC 3.17* 11/12/2014 0614   RBC 1.74* 05/15/2014 0740   HGB 9.5* 11/12/2014 0614   HCT 29.6* 11/12/2014 0614   HCT 25.4* 11/03/2014 0725   PLT 449* 11/12/2014 0614   MCV 93.4 11/12/2014 0614   LYMPHSABS 2.7 11/12/2014 0614   MONOABS 1.7* 11/12/2014 0614   EOSABS 0.0 11/12/2014 0614   BASOSABS 0.0 11/12/2014 0614    CMP     Component Value Date/Time   NA 134* 11/12/2014 0614   NA 136* 07/27/2014   K 4.4 11/12/2014  0614   CL 97 11/12/2014 0614   CO2 32 11/12/2014 0614   GLUCOSE 124* 11/12/2014 0614   BUN 9 11/12/2014 0614   BUN 16 07/27/2014   CREATININE 0.54 11/12/2014 0614   CREATININE 0.4* 07/27/2014   CALCIUM 8.8 11/12/2014 0614   PROT 7.3 11/08/2014 0600   ALBUMIN 1.8* 11/08/2014 0600   AST 21 11/08/2014 0600   ALT 8 11/08/2014 0600   ALKPHOS 86 11/08/2014 0600   BILITOT 0.4 11/08/2014 0600   GFRNONAA >90 11/12/2014 0614   GFRAA >90 11/12/2014 0614    Assessment and Plan  HLD (hyperlipidemia) In 07/2014 LDL 72. HDL 22 on no meds;Plan - check yearly   Hypernatremia Pt's Na+ ha been table since at least 07/2014 -138, 08/27/2014 -136, 10/19/2014-  138;Plan - continue to monitor   PEG (percutaneous endoscopic gastrostomy) status Recent PEG tube malfunction,not working;hub was fixed;Plan - continue PEG tube feedings   Seizure disorder No seizure activity since hospitalization;continue keppra and vaproic acid   Diastolic heart failure, NYHA class 2 No exacerbations, has been stable;Plan-continue lasix and bblocker   Hypothyroidism Synthroid at 100 mcg for > 6 we3ks, time to get TSH;Plan - draw TSH lab   Restless movement disorder Recently developed;has been treated with clonazepam scheduled ans it is better but still there and sometimes pt moves so much she can fall;could be related to CP;Plan- outpt neurology consult     Hennie Duos, MD

## 2014-12-24 NOTE — Assessment & Plan Note (Signed)
No exacerbations, has been stable;Plan-continue lasix and bblocker

## 2014-12-27 LAB — TSH: TSH: 3.04 u[IU]/mL (ref <=5.90)

## 2015-01-04 ENCOUNTER — Other Ambulatory Visit (HOSPITAL_COMMUNITY): Payer: Self-pay | Admitting: Diagnostic Radiology

## 2015-01-04 DIAGNOSIS — R1310 Dysphagia, unspecified: Secondary | ICD-10-CM

## 2015-01-05 ENCOUNTER — Ambulatory Visit (HOSPITAL_COMMUNITY)
Admission: RE | Admit: 2015-01-05 | Discharge: 2015-01-05 | Disposition: A | Discharge status: Home / Self Care | Payer: Medicare Other | Source: Ambulatory Visit | Attending: Diagnostic Radiology | Admitting: Diagnostic Radiology

## 2015-01-05 ENCOUNTER — Other Ambulatory Visit (HOSPITAL_COMMUNITY): Payer: Self-pay | Admitting: Diagnostic Radiology

## 2015-01-05 DIAGNOSIS — Z431 Encounter for attention to gastrostomy: Secondary | ICD-10-CM | POA: Insufficient documentation

## 2015-01-05 DIAGNOSIS — R1310 Dysphagia, unspecified: Secondary | ICD-10-CM | POA: Diagnosis not present

## 2015-01-05 MED ORDER — IOHEXOL 300 MG/ML  SOLN
50.0000 mL | Freq: Once | INTRAMUSCULAR | Status: AC | PRN
  Administered 2015-01-05: 10 mL via INTRAVENOUS

## 2015-01-10 ENCOUNTER — Encounter: Payer: Self-pay | Admitting: Diagnostic Neuroimaging

## 2015-01-10 ENCOUNTER — Ambulatory Visit (INDEPENDENT_AMBULATORY_CARE_PROVIDER_SITE_OTHER): Payer: Medicare Other | Admitting: Diagnostic Neuroimaging

## 2015-01-10 VITALS — BP 113/72 | HR 85 | Ht 62.0 in

## 2015-01-10 DIAGNOSIS — F03C0 Unspecified dementia, severe, without behavioral disturbance, psychotic disturbance, mood disturbance, and anxiety: Secondary | ICD-10-CM

## 2015-01-10 DIAGNOSIS — R54 Age-related physical debility: Secondary | ICD-10-CM

## 2015-01-10 DIAGNOSIS — G40909 Epilepsy, unspecified, not intractable, without status epilepticus: Secondary | ICD-10-CM

## 2015-01-10 DIAGNOSIS — F0390 Unspecified dementia without behavioral disturbance: Secondary | ICD-10-CM

## 2015-01-10 DIAGNOSIS — F72 Severe intellectual disabilities: Secondary | ICD-10-CM | POA: Diagnosis not present

## 2015-01-10 NOTE — Patient Instructions (Signed)
Consider palliative care / comfort care. Patient's sister  / guardian wants to discuss discontinuation of tube feeding.   May use morphine and ativan as needed for agitation / pain.

## 2015-01-10 NOTE — Progress Notes (Signed)
[facility]  PATIENT: Dawn [name] DOB: 1946-06-26  REFERRING CLINICIAN: Inocencio Homes, MD HISTORY FROM: patient's sister (EPIC chart review, Dr. Melanee Left noted from [employer], referring notes) REASON FOR VISIT: new consult    HISTORICAL  CHIEF COMPLAINT:  Chief Complaint  Patient presents with  . New Evaluation    restless involuntary movements     HISTORY OF PRESENT ILLNESS:   69 year old female with history of mental retardation, dementia, seizure disorder, here for evaluation of restless movements/agitation. Patient referred for consultation by Dr. Sheppard Coil. Patient has had mental retardation recent severe meningitis at age 65 years old. Also has had seizure disorder throughout her life. She has received expert care from her family and doctors and has been able to live a long life. Patient has been living at [facility] facility since July 2015, after hospitalization where she received a feeding tube. She previously was living in a group home. Since July 2015 patient has had no seizures according to the patient's sister. However in the last few months PCP has noted some intermittent restlessness and agitation, involuntary movements. Patient was started on Ativan 1 mg as needed, and today in the office patient is resting, sleeping peacefully.   REVIEW OF SYSTEMS: Full 14 system review of systems performed and notable only for weight loss trouble swallowing skin moles incontinence loss of vision to much sleep difficulty swallowing seizure disorder restless legs.  ALLERGIES: Allergies  Allergen Reactions  . Carvedilol   . Ppd [Tuberculin Purified Protein Derivative]     Per [facility]    HOME MEDICATIONS: Outpatient Prescriptions Prior to Visit  Medication Sig Dispense Refill  . docusate (COLACE) 50 MG/5ML liquid Place 20 mLs (200 mg total) into feeding tube 2 (two) times daily. 100 mL 0  . famotidine (PEPCID) 40 MG/5ML suspension  Place 20 mg into feeding tube daily.    . furosemide (LASIX) 10 MG/ML solution Place 20 mg into feeding tube 2 (two) times daily.    Marland Kitchen levETIRAcetam (KEPPRA) 100 MG/ML solution Place 5 mLs (500 mg total) into feeding tube daily at 6 (six) AM. 473 mL 12  . levETIRAcetam (KEPPRA) 100 MG/ML solution 750mg  every day at 1400hrs and 2000hrs (Patient taking differently: Place 750 mg into feeding tube 2 (two) times daily. at 1400hrs and 2000) 473 mL 12  . levothyroxine (SYNTHROID, LEVOTHROID) 100 MCG tablet Take 100 mcg by mouth daily before breakfast.    . metoprolol tartrate (LOPRESSOR) 25 mg/10 mL SUSP Take 12.5 mg by mouth 3 (three) times daily.    . Multiple Vitamins-Minerals (CENTAMIN) LIQD Place 5 mLs into feeding tube daily.    . polyethylene glycol (MIRALAX / GLYCOLAX) packet Take 17 g by mouth daily.    . Valproic Acid (DEPAKENE) 250 MG/5ML SYRP syrup Place 15 mLs (750 mg total) into feeding tube 3 (three) times daily. 600 mL   . bacitracin 500 UNIT/GM ointment Apply 1 application topically 2 (two) times daily.    . heparin 5000 UNIT/ML injection Inject into the skin every 8 (eight) hours.     No facility-administered medications prior to visit.    PAST MEDICAL HISTORY: Past Medical History  Diagnosis Date  . Seizures   . Osteoporosis   . Thyroid disease     hypothyroid  . High cholesterol   . Cataracts, bilateral   . Optic atrophy   . Paraparesis     mild  . Hypothyroidism   . Anginal pain   . Coronary artery  disease     PAST SURGICAL HISTORY: Past Surgical History  Procedure Laterality Date  . Midline incision    . Central venous catheter insertion  05/15/2014       . Esophagogastroduodenoscopy N/A 06/03/2014    Procedure: ESOPHAGOGASTRODUODENOSCOPY (EGD);  Surgeon: Beryle Beams, MD;  Location: [facility] ENDOSCOPY;  Service: Endoscopy;  Laterality: N/A;    FAMILY HISTORY: Family History  Problem Relation Age of Onset  . Colon cancer Mother   . Diabetes type II Sister      SOCIAL HISTORY:  History   Social History  . Marital Status: Single    Spouse Name: N/A  . Number of Children: N/A  . Years of Education: N/A   Occupational History  . Not on file.   Social History Main Topics  . Smoking status: Never Smoker   . Smokeless tobacco: Never Used  . Alcohol Use: No  . Drug Use: No  . Sexual Activity: No   Other Topics Concern  . Not on file   Social History Narrative     PHYSICAL EXAM  Filed Vitals:   01/10/15 1120  BP: 113/72  Pulse: 85  Height: 5\' 2"  (1.575 m)    Body mass index is 0.00 kg/(m^2).   Visual Acuity Screening   Right eye Left eye Both eyes  Without correction: unable unable   With correction:       No flowsheet data found.  GENERAL EXAM: Patient is in no distress; CALM, RELAXED; SLEEPING PEACEFULLY.   CARDIOVASCULAR: Regular rate and rhythm, no murmurs, no carotid bruits  NEUROLOGIC: MENTAL STATUS: EYE CLOSED. NON-VERBAL. MINIMALLY OPENS EYES TO STIM, BUT NOT TO VOICE. NOT oriented to person, place and time. Recent and remote memory NOT INTACT. Attention and concentration NOT INTACT. NON-VERBAL. NOT FOLLOWING COMMANDS. CANNOT NAME. Fund of knowledge NOT APPROPRIATE.  CRANIAL NERVE: no papilledema on fundoscopic exam -- SEVERE CATARACTS; pupils PINPOINT AND NON-REACTIVE; NO BLINK TO THREAT ON visual field TESTING; DOLL'S EYE POSITIVE; no nystagmus, facial sensation and strength symmetric, hearing NOT INTACT; palate symmetric, uvula midline, shoulders symmetric, tongue midline. MOTOR: INCR TONE IN BUE AND BLE; BUE 1-2; BLE 0 SENSORY: MILD WITHDRAWAL TO STIM IN BUE; NO REACTION IN BLE COORDINATION: DOES NOT FOLLOW COMMANDS FOR COORDINATION TESTING REFLEXES: BUE 1, BLE 0 GAIT/STATION: IN STRETCHER; BEDBOUND; CANNOT STAND    DIAGNOSTIC DATA (LABS, IMAGING, TESTING) - I reviewed patient records, labs, notes, testing and imaging myself where available.  Lab Results  Component Value Date   WBC 12.7*  11/12/2014   HGB 9.5* 11/12/2014   HCT 29.6* 11/12/2014   MCV 93.4 11/12/2014   PLT 449* 11/12/2014      Component Value Date/Time   NA 134* 11/12/2014 0614   NA 136* 07/27/2014   K 4.4 11/12/2014 0614   CL 97 11/12/2014 0614   CO2 32 11/12/2014 0614   GLUCOSE 124* 11/12/2014 0614   BUN 9 11/12/2014 0614   BUN 16 07/27/2014   CREATININE 0.54 11/12/2014 0614   CREATININE 0.4* 07/27/2014   CALCIUM 8.8 11/12/2014 0614   PROT 7.3 11/08/2014 0600   ALBUMIN 1.8* 11/08/2014 0600   AST 21 11/08/2014 0600   ALT 8 11/08/2014 0600   ALKPHOS 86 11/08/2014 0600   BILITOT 0.4 11/08/2014 0600   GFRNONAA >90 11/12/2014 0614   GFRAA >90 11/12/2014 0614   Lab Results  Component Value Date   CHOL 117 07/27/2014   HDL 22* 07/27/2014   LDLCALC 72 07/27/2014   TRIG 116  07/27/2014   No results found for: HGBA1C Lab Results  Component Value Date   [id number] 968* 11/03/2014   Lab Results  Component Value Date   TSH 4.287 11/03/2014    08/03/14 CT head - Large scalp hematoma over the right parietal bone without underlying fracture or acute intracranial abnormality. No acute abnormality cervical spine. Chronic cerebral atrophy, unchanged in appearance. [I reviewed images myself and agree with interpretation. -VRP]      ASSESSMENT AND PLAN  69 y.o. year old female here with:  Dx: Severe mental retardation  Severe dementia  Senile debility  Seizure disorder    She is non-verbal and totally dependent on others for all daily activities. She does not recognize family or any family people. Seizures are better controlled over past 6 months. Some intermittent restless movements (not seen today) which may be behavioral, discomfort/pain related, or progression of her neurodegenerative disease.    PLAN: - continue anti-seizure medications - agree with lorazepam or other sedative/pain medications as needed for agitation/restlessness - I spent 45 minutes of face to face time with  patient. Greater than 50% of time was spent in counseling and coordination of care with patient. We discussed her neurologic conditions and treatments. We discussed her goals of care, artificial nutrition and hydration, prolongation of life, and focusing on quality of life for the patient. Patient's sister and niece apparently are contemplating discontinuation of tube feedings as they feel that this may be leading to prolongation of suffering rather than promoting a good quality of life. I agree with their concerns.  Return for return to PCP.     Penni Bombard, MD 8/64/8472, 07:21 PM Certified in Neurology, Neurophysiology and Neuroimaging  [facility] [address] [place], [place] [address] [phone]

## 2015-01-16 ENCOUNTER — Encounter: Payer: Self-pay | Admitting: Internal Medicine

## 2015-01-16 ENCOUNTER — Non-Acute Institutional Stay (SKILLED_NURSING_FACILITY): Payer: Medicare Other | Admitting: Internal Medicine

## 2015-01-16 DIAGNOSIS — I5030 Unspecified diastolic (congestive) heart failure: Secondary | ICD-10-CM | POA: Diagnosis not present

## 2015-01-16 DIAGNOSIS — F0390 Unspecified dementia without behavioral disturbance: Secondary | ICD-10-CM | POA: Diagnosis not present

## 2015-01-16 DIAGNOSIS — F72 Severe intellectual disabilities: Secondary | ICD-10-CM

## 2015-01-16 DIAGNOSIS — F03C0 Unspecified dementia, severe, without behavioral disturbance, psychotic disturbance, mood disturbance, and anxiety: Secondary | ICD-10-CM

## 2015-01-16 DIAGNOSIS — R450 Nervousness: Secondary | ICD-10-CM

## 2015-01-16 DIAGNOSIS — G40909 Epilepsy, unspecified, not intractable, without status epilepticus: Secondary | ICD-10-CM | POA: Diagnosis not present

## 2015-01-16 DIAGNOSIS — Z931 Gastrostomy status: Secondary | ICD-10-CM | POA: Diagnosis not present

## 2015-01-16 DIAGNOSIS — G2571 Drug induced akathisia: Secondary | ICD-10-CM

## 2015-01-16 NOTE — Progress Notes (Signed)
MRN: [id number] Name: Dawn [name]  Sex: female Age: 69 y.o. DOB: 16-Mar-1946  [place] #: [name] Facility/Room:  Level Of Care: SNF Provider: Inocencio Homes D Emergency Contacts: Extended Emergency Contact Information Primary Emergency Contact: Sellars,[name] Address: [address]          [place] [address] [place] Phone: [phone] Relation: Sister Secondary Emergency Contact: [place] Phone: [phone] Relation: None  Code Status:DNR   Allergies: Carvedilol and Ppd  Chief Complaint  Patient presents with  . family conference with patient present    HPI: Patient is 69 y.o. female whose daughter and her husband asked to meet to discuss why pt was sent to Neurologist. Pt's daughter went with the patient to the appointment and spoke herself to the neurologist so .........  Past Medical History  Diagnosis Date  . Seizures   . Osteoporosis   . Thyroid disease     hypothyroid  . High cholesterol   . Cataracts, bilateral   . Optic atrophy   . Paraparesis     mild  . Hypothyroidism   . Anginal pain   . Coronary artery disease     Past Surgical History  Procedure Laterality Date  . Midline incision    . Central venous catheter insertion  05/15/2014       . Esophagogastroduodenoscopy N/A 06/03/2014    Procedure: ESOPHAGOGASTRODUODENOSCOPY (EGD);  Surgeon: Beryle Beams, MD;  Location: [facility] ENDOSCOPY;  Service: Endoscopy;  Laterality: N/A;      Medication List       This list is accurate as of: 01/16/15  8:04 PM.  Always use your most recent med list.               bisacodyl 10 MG suppository  Commonly known as:  DULCOLAX  Place 10 mg rectally daily as needed for moderate constipation.     CENTAMIN Liqd  Place 5 mLs into feeding tube daily.     clonazePAM 0.5 MG tablet  Commonly known as:  KLONOPIN  Take 0.5 mg by mouth every 6 (six) hours as needed for anxiety.     clonazePAM 0.5 MG  tablet  Commonly known as:  KLONOPIN  Take 0.5 mg by mouth at bedtime as needed (movement issues).     docusate 50 MG/5ML liquid  Commonly known as:  COLACE  Place 20 mLs (200 mg total) into feeding tube 2 (two) times daily.     famotidine 40 MG/5ML suspension  Commonly known as:  PEPCID  Place 20 mg into feeding tube daily.     furosemide 10 MG/ML solution  Commonly known as:  LASIX  Place 20 mg into feeding tube 2 (two) times daily.     levETIRAcetam 100 MG/ML solution  Commonly known as:  KEPPRA  Place 5 mLs (500 mg total) into feeding tube daily at 6 (six) AM.     levETIRAcetam 100 MG/ML solution  Commonly known as:  KEPPRA  750mg  every day at 1400hrs and 2000hrs     levothyroxine 100 MCG tablet  Commonly known as:  SYNTHROID, LEVOTHROID  Take 100 mcg by mouth daily before breakfast.     magnesium hydroxide 400 MG/5ML suspension  Commonly known as:  MILK OF MAGNESIA  Place 30 mLs into feeding tube daily as needed for mild constipation.     metoprolol tartrate 25 mg/10 mL Susp  Commonly known as:  LOPRESSOR  Take 12.5 mg by mouth 3 (three)  times daily.     polyethylene glycol packet  Commonly known as:  MIRALAX / GLYCOLAX  Take 17 g by mouth daily.     Valproic Acid 250 MG/5ML Syrp syrup  Commonly known as:  DEPAKENE  Place 15 mLs (750 mg total) into feeding tube 3 (three) times daily.        No orders of the defined types were placed in this encounter.    Immunization History  Administered Date(s) Administered  . Influenza-Unspecified 08/13/2014    History  Substance Use Topics  . Smoking status: Never Smoker   . Smokeless tobacco: Never Used  . Alcohol Use: No    Review of Systems  DATA OBTAINED: from patient, nurse, medical record GENERAL:  no fevers,  appetite changes- PEG tube- some weight loss SKIN: No itching, rash HEENT: No complaint RESPIRATORY: No cough, wheezing, SOB CARDIAC: No chest pain, palpitations, lower extremity edema  GI: No  abdominal pain, No N/V/D or constipation, No reflux  GU: diaper MUSCULOSKELETAL: No apparent pain NEUROLOGIC: No new changes PSYCHIATRIC: unable 2/2 MR with dementia  Filed Vitals:   01/16/15 1816  BP: 109/66  Pulse: 72  Temp: 98 F (36.7 C)  Resp: 18    Physical Exam  GENERAL APPEARANCE: Alert, No acute distress  SKIN: No diaphoresis rash HEENT: Unremarkable RESPIRATORY: Breathing is even, unlabored  CARDIOVASCULAR:  No peripheral edema  GASTROINTESTINAL: Abdomen is not distended  GENITOURINARY: Bladder non tender, not distended  MUSCULOSKELETAL: wasting and contractures NEUROLOGIC: paraplegia PSYCHIATRIC: no aggitation  Patient Active Problem List   Diagnosis Date Noted  . Severe mental retardation 01/10/2015  . Severe dementia 01/10/2015  . Senile debility 01/10/2015  . 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 Acute respiratory failure with hypoxia 11/20/2014  . Generalized weakness 11/20/2014  . Dysphagia 11/20/2014  . Dementia without behavioral disturbance 11/20/2014  . Development delay 11/02/2014  . Acute respiratory distress 11/02/2014  . Paroxysmal ventricular tachycardia 10/30/2014  . HCAP (healthcare-associated pneumonia) 10/26/2014  . Hematoma of right parietal scalp 08/13/2014  . Edema 08/09/2014  . Hematoma 08/05/2014  . Fall at nursing home 08/05/2014  . Leukocytosis 07/26/2014  . Tachycardia 07/11/2014  . Leukocytopenia, unspecified 07/11/2014  . Pneumonia 06/17/2014  . Sepsis 06/12/2014  . Facial droop 06/12/2014  . Hypernatremia 06/12/2014  . PEG (percutaneous endoscopic gastrostomy) status 06/12/2014  . FUO (fever of unknown origin) 06/02/2014  . Paroxysmal a-fib 05/14/2014  . Dysphasia 05/14/2014  . UTI (lower urinary tract infection) 05/14/2014  . Tooth decay 05/14/2014  . SIRS (systemic inflammatory response syndrome) 12/15/2013  . Thrombocytopenia 10/29/2013  . Hypotension 10/29/2013  . Anemia of chronic disease 10/29/2013  .  Diastolic heart failure, NYHA class 2 10/29/2013  . Atrial fibrillation with rapid ventricular response 10/28/2013  . Seizure disorder 10/28/2013  . Hypothyroidism 10/28/2013  . HLD (hyperlipidemia) 10/28/2013  . Non-ST elevation myocardial infarction (NSTEMI) 10/28/2013    CBC    Component Value Date/Time   WBC 12.7* 11/12/2014 0614   WBC 12.2 07/27/2014   RBC 3.17* 11/12/2014 0614   RBC 1.74* 05/15/2014 0740   HGB 9.5* 11/12/2014 0614   HCT 29.6* 11/12/2014 0614   HCT 25.4* 11/03/2014 0725   PLT 449* 11/12/2014 0614   MCV 93.4 11/12/2014 0614   LYMPHSABS 2.7 11/12/2014 0614   MONOABS 1.7* 11/12/2014 0614   EOSABS 0.0 11/12/2014 0614   BASOSABS 0.0 11/12/2014 0614    CMP     Component Value Date/Time   NA 134* 11/12/2014 6967  NA 136* 07/27/2014   K 4.4 11/12/2014 0614   CL 97 11/12/2014 0614   CO2 32 11/12/2014 0614   GLUCOSE 124* 11/12/2014 0614   BUN 9 11/12/2014 0614   BUN 16 07/27/2014   CREATININE 0.54 11/12/2014 0614   CREATININE 0.4* 07/27/2014   CALCIUM 8.8 11/12/2014 0614   PROT 7.3 11/08/2014 0600   ALBUMIN 1.8* 11/08/2014 0600   AST 21 11/08/2014 0600   ALT 8 11/08/2014 0600   ALKPHOS 86 11/08/2014 0600   BILITOT 0.4 11/08/2014 0600   GFRNONAA >90 11/12/2014 0614   GFRAA >90 11/12/2014 0614    Assessment and Plan  FAMILY CONFERENCE WITH PT PRESENT- I had asked for a meeting because neurologist had indicated in his note that pt's daughter was interested in [place] care, but she is not. The pt had been made a DNR AFTER the PEG tube was placed. The neurologist discussed in his note, my concern about pt's involuntary movements, so I know he discussed it during the pt visit and I went over it again my plan to be sure there was nothing else to offer in terms of treatment for these movements that are so severe that pt is falling out of bed. Weight loss was discussed and since pt has the exact same calories in and the exact same no physical activity  everyday, weight loss could be a presage that pt may be nearing end of physical life even with artifical feeding. Pt's sister said she understood but I don't believe she does. Pt's best bet at a peaceful death is to die in her sleep. In the meantime PEG tube intake has been increased to see if that increases  weight.There is even more concern for aspiration, which I discussed with family, if food is continously pumping into a body that is not using it towards nutrition when that time comes. Will continue to monitor.   PT SEEN 01/14/2015 > 35 minutes spent in prep for and time with pt and family. Hennie Duos, MD

## 2015-02-21 ENCOUNTER — Encounter: Payer: Self-pay | Admitting: *Deleted

## 2015-03-04 ENCOUNTER — Encounter: Payer: Self-pay | Admitting: Internal Medicine

## 2015-03-04 ENCOUNTER — Non-Acute Institutional Stay (SKILLED_NURSING_FACILITY): Payer: Medicare Other | Admitting: Internal Medicine

## 2015-03-04 DIAGNOSIS — R450 Nervousness: Secondary | ICD-10-CM | POA: Diagnosis not present

## 2015-03-04 DIAGNOSIS — Z931 Gastrostomy status: Secondary | ICD-10-CM | POA: Diagnosis not present

## 2015-03-04 DIAGNOSIS — E034 Atrophy of thyroid (acquired): Secondary | ICD-10-CM | POA: Diagnosis not present

## 2015-03-04 DIAGNOSIS — E038 Other specified hypothyroidism: Secondary | ICD-10-CM | POA: Diagnosis not present

## 2015-03-04 DIAGNOSIS — F0390 Unspecified dementia without behavioral disturbance: Secondary | ICD-10-CM | POA: Diagnosis not present

## 2015-03-04 DIAGNOSIS — F72 Severe intellectual disabilities: Secondary | ICD-10-CM | POA: Diagnosis not present

## 2015-03-04 DIAGNOSIS — G2571 Drug induced akathisia: Secondary | ICD-10-CM

## 2015-03-04 NOTE — Assessment & Plan Note (Signed)
Along with dementia, pt is basically vegetative, no changes in baseline

## 2015-03-04 NOTE — Assessment & Plan Note (Signed)
Imposed on MR;pt basically vegetative with PEG feedins, at her baseline

## 2015-03-04 NOTE — Assessment & Plan Note (Signed)
On Synthroid 100 mcg daliy TSH was 3.045; continue current dose

## 2015-03-04 NOTE — Assessment & Plan Note (Signed)
One of her chronic ongoing problems is she pulls at her PEG tube, it barely works and each time we send her to get it replaced they jerry rig it a little and sends her back;Plan- pt had abdominal binder in place to protect tube

## 2015-03-04 NOTE — Assessment & Plan Note (Signed)
Per neurology no new suggestions, klonopin is fine;nurses report movement less often; continue clonazepam.

## 2015-03-04 NOTE — Progress Notes (Signed)
MRN: [id number] Name: Dawn [name]  Sex: female Age: 69 y.o. DOB: [birth year]  [place] #: [name] Facility/Room:205W Level Of Care: SNF Provider: Inocencio Homes D Emergency Contacts: Extended Emergency Contact Information Primary Emergency Contact: Sellars,[name] Address: [address]          [place] [address] [place] Phone: [phone] Relation: Sister Secondary Emergency Contact: [place] Phone: [phone] Relation: None  Code Status:DNR after PEG placed   Allergies: Carvedilol and Ppd  Chief Complaint  Patient presents with  . Medical Management of Chronic Issues    HPI: Patient is 69 y.o. female who is being seen for routine issues.  Past Medical History  Diagnosis Date  . Seizures   . Osteoporosis   . Thyroid disease     hypothyroid  . High cholesterol   . Cataracts, bilateral   . Optic atrophy   . Paraparesis     mild  . Hypothyroidism   . Anginal pain   . Coronary artery disease     Past Surgical History  Procedure Laterality Date  . Midline incision    . Central venous catheter insertion  05/15/2014       . Esophagogastroduodenoscopy N/A 06/03/2014    Procedure: ESOPHAGOGASTRODUODENOSCOPY (EGD);  Surgeon: Beryle Beams, MD;  Location: [facility] ENDOSCOPY;  Service: Endoscopy;  Laterality: N/A;      Medication List       This list is accurate as of: 03/04/15 10:41 AM.  Always use your most recent med list.               bisacodyl 10 MG suppository  Commonly known as:  DULCOLAX  Place 10 mg rectally daily as needed for moderate constipation.     CENTAMIN Liqd  Place 5 mLs into feeding tube daily.     clonazePAM 0.5 MG tablet  Commonly known as:  KLONOPIN  Take 0.5 mg by mouth every 6 (six) hours as needed for anxiety.     clonazePAM 0.5 MG tablet  Commonly known as:  KLONOPIN  Take 0.5 mg by mouth at bedtime as needed (movement issues).     docusate 50 MG/5ML liquid   Commonly known as:  COLACE  Place 20 mLs (200 mg total) into feeding tube 2 (two) times daily.     famotidine 40 MG/5ML suspension  Commonly known as:  PEPCID  Place 20 mg into feeding tube daily.     furosemide 10 MG/ML solution  Commonly known as:  LASIX  Place 20 mg into feeding tube 2 (two) times daily.     levETIRAcetam 100 MG/ML solution  Commonly known as:  KEPPRA  Place 5 mLs (500 mg total) into feeding tube daily at 6 (six) AM.     levETIRAcetam 100 MG/ML solution  Commonly known as:  KEPPRA  750mg  every day at 1400hrs and 2000hrs     levothyroxine 100 MCG tablet  Commonly known as:  SYNTHROID, LEVOTHROID  Take 100 mcg by mouth daily before breakfast.     magnesium hydroxide 400 MG/5ML suspension  Commonly known as:  MILK OF MAGNESIA  Place 30 mLs into feeding tube daily as needed for mild constipation.     metoprolol tartrate 25 mg/10 mL Susp  Commonly known as:  LOPRESSOR  Take 12.5 mg by mouth 3 (three) times daily.     polyethylene glycol packet  Commonly known as:  MIRALAX / GLYCOLAX  Take 17 g by mouth daily.  Valproic Acid 250 MG/5ML Syrp syrup  Commonly known as:  DEPAKENE  Place 15 mLs (750 mg total) into feeding tube 3 (three) times daily.        No orders of the defined types were placed in this encounter.    Immunization History  Administered Date(s) Administered  . Influenza-Unspecified 08/13/2014    History  Substance Use Topics  . Smoking status: Never Smoker   . Smokeless tobacco: Never Used  . Alcohol Use: No    Review of Systems   UTO from pt; nurse reports she is at baseline;aid reports movements sometimes which is also baseline    Filed Vitals:   03/04/15 1031  BP: 109/66  Pulse: 101  Temp: 98.1 F (36.7 C)  Resp: 28    Physical Exam  GENERAL APPEARANCE: Alert, conversant, No acute distress  SKIN: No diaphoresis rash HEENT: Unremarkable RESPIRATORY: Breathing is even, unlabored. Lung sounds are clear    CARDIOVASCULAR: Heart RRR no murmurs, rubs or gallops. No peripheral edema  GASTROINTESTINAL: Abdomen is soft, non-tender, not distended w/ normal bowel sounds; PEG tube.  GENITOURINARY: Bladder non tender, not distended  MUSCULOSKELETAL:flexion and contractures BLE NEUROLOGIC: Cranial nerves 2-12 grossly intact; no inv movements at this time. PSYCHIATRIC: vegetative, no behavioral issues  Patient Active Problem List   Diagnosis Date Noted  . Severe mental retardation 01/10/2015  . Severe dementia 01/10/2015  . Senile debility 01/10/2015  . 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 Acute respiratory failure with hypoxia 11/20/2014  . Generalized weakness 11/20/2014  . Dysphagia 11/20/2014  . Dementia without behavioral disturbance 11/20/2014  . Development delay 11/02/2014  . Acute respiratory distress 11/02/2014  . Paroxysmal ventricular tachycardia 10/30/2014  . HCAP (healthcare-associated pneumonia) 10/26/2014  . Hematoma of right parietal scalp 08/13/2014  . Edema 08/09/2014  . Hematoma 08/05/2014  . Fall at nursing home 08/05/2014  . Leukocytosis 07/26/2014  . Tachycardia 07/11/2014  . Leukocytopenia, unspecified 07/11/2014  . Pneumonia 06/17/2014  . Sepsis 06/12/2014  . Facial droop 06/12/2014  . Hypernatremia 06/12/2014  . PEG (percutaneous endoscopic gastrostomy) status 06/12/2014  . FUO (fever of unknown origin) 06/02/2014  . Paroxysmal a-fib 05/14/2014  . Dysphasia 05/14/2014  . UTI (lower urinary tract infection) 05/14/2014  . Tooth decay 05/14/2014  . SIRS (systemic inflammatory response syndrome) 12/15/2013  . Thrombocytopenia 10/29/2013  . Hypotension 10/29/2013  . Anemia of chronic disease 10/29/2013  . Diastolic heart failure, NYHA class 2 10/29/2013  . Atrial fibrillation with rapid ventricular response 10/28/2013  . Seizure disorder 10/28/2013  . Hypothyroidism 10/28/2013  . HLD (hyperlipidemia) 10/28/2013  . Non-ST elevation myocardial infarction  (NSTEMI) 10/28/2013    CBC    Component Value Date/Time   WBC 12.7* 11/12/2014 0614   WBC 20.8 10/19/2014   RBC 3.17* 11/12/2014 0614   RBC 1.74* 05/15/2014 0740   HGB 9.5* 11/12/2014 0614   HCT 29.6* 11/12/2014 0614   HCT 25.4* 11/03/2014 0725   PLT 449* 11/12/2014 0614   MCV 93.4 11/12/2014 0614   LYMPHSABS 2.7 11/12/2014 0614   MONOABS 1.7* 11/12/2014 0614   EOSABS 0.0 11/12/2014 0614   BASOSABS 0.0 11/12/2014 0614    CMP     Component Value Date/Time   NA 134* 11/12/2014 0614   NA 138 10/19/2014   K 4.4 11/12/2014 0614   CL 97 11/12/2014 0614   CO2 32 11/12/2014 0614   GLUCOSE 124* 11/12/2014 0614   BUN 9 11/12/2014 0614   BUN 26* 10/19/2014   CREATININE 0.54 11/12/2014  3818   CREATININE 0.4* 07/27/2014   CALCIUM 8.8 11/12/2014 0614   PROT 7.3 11/08/2014 0600   ALBUMIN 1.8* 11/08/2014 0600   AST 21 11/08/2014 0600   ALT 8 11/08/2014 0600   ALKPHOS 86 11/08/2014 0600   BILITOT 0.4 11/08/2014 0600   GFRNONAA >90 11/12/2014 0614   GFRAA >90 11/12/2014 0614    Assessment and Plan  PEG (percutaneous endoscopic gastrostomy) status One of her chronic ongoing problems is she pulls at her PEG tube, it barely works and each time we send her to get it replaced they jerry rig it a little and sends her back;Plan- pt had abdominal binder in place to protect tube   Severe mental retardation Along with dementia, pt is basically vegetative, no changes in baseline   Dementia without behavioral disturbance Imposed on MR;pt basically vegetative with PEG feedins, at her baseline   Restless movement disorder Per neurology no new suggestions, klonopin is fine;nurses report movement less often; continue clonazepam.   Hypothyroidism On Synthroid 100 mcg daliy TSH was 3.045; continue current dose     Hennie Duos, MD

## 2015-03-08 ENCOUNTER — Other Ambulatory Visit: Payer: Self-pay | Admitting: Internal Medicine

## 2015-03-08 DIAGNOSIS — K9423 Gastrostomy malfunction: Secondary | ICD-10-CM

## 2015-03-09 ENCOUNTER — Ambulatory Visit (HOSPITAL_COMMUNITY)
Admission: RE | Admit: 2015-03-09 | Discharge: 2015-03-09 | Disposition: A | Discharge status: Home / Self Care | Payer: Medicare Other | Source: Ambulatory Visit | Attending: Internal Medicine | Admitting: Internal Medicine

## 2015-03-09 DIAGNOSIS — K9423 Gastrostomy malfunction: Secondary | ICD-10-CM | POA: Insufficient documentation

## 2015-03-09 DIAGNOSIS — Y733 Surgical instruments, materials and gastroenterology and urology devices (including sutures) associated with adverse incidents: Secondary | ICD-10-CM | POA: Insufficient documentation

## 2015-03-09 MED ORDER — IOHEXOL 300 MG/ML  SOLN
50.0000 mL | Freq: Once | INTRAMUSCULAR | Status: AC | PRN
  Administered 2015-03-09: 10 mL via INTRAVENOUS

## 2015-03-09 MED ORDER — LIDOCAINE VISCOUS 2 % MT SOLN
OROMUCOSAL | Status: AC
  Filled 2015-03-09: qty 15

## 2015-03-09 NOTE — Procedures (Signed)
Procedure:  Replacement of gastrostomy tube Findings:  24 Fr bumper retention tube removed.  New 24 Fr balloon retention tube placed.  Tip in body of stomach.  OK to use.  Venetia Night. Kathlene Cote, M.D Pager:  [phone]

## 2015-03-23 ENCOUNTER — Encounter: Payer: Self-pay | Admitting: Internal Medicine

## 2015-03-23 ENCOUNTER — Non-Acute Institutional Stay (SKILLED_NURSING_FACILITY): Payer: Medicare Other | Admitting: Internal Medicine

## 2015-03-23 DIAGNOSIS — R1310 Dysphagia, unspecified: Secondary | ICD-10-CM

## 2015-03-23 DIAGNOSIS — Z931 Gastrostomy status: Secondary | ICD-10-CM | POA: Diagnosis not present

## 2015-03-23 NOTE — Assessment & Plan Note (Addendum)
PEG feedings only; pt's PEG tube has had multiple problems and it has been replaced 5/23

## 2015-03-23 NOTE — Progress Notes (Signed)
MRN: [id number] Name: Dawn [name]  Sex: female Age: 69 y.o. DOB: 08-11-1946  [place] #: [name] Facility/Room:205 Level Of Care: SNF Provider: Inocencio Homes D Emergency Contacts: Extended Emergency Contact Information Primary Emergency Contact: Sellars,[name] Address: [address]          [place] [address] [place] Phone: [phone] Relation: Sister Secondary Emergency Contact: Jocelynne Cower [place] Phone: [phone] Relation: None  Code Status: DNR  Allergies: Carvedilol and Ppd  Chief Complaint  Patient presents with  . Medical Management of Chronic Issues    HPI: Patient is 69 y.o. female who is being seen for routine issues.  Past Medical History  Diagnosis Date  . Seizures   . Osteoporosis   . Thyroid disease     hypothyroid  . High cholesterol   . Cataracts, bilateral   . Optic atrophy   . Paraparesis     mild  . Hypothyroidism   . Anginal pain   . Coronary artery disease     Past Surgical History  Procedure Laterality Date  . Midline incision    . Central venous catheter insertion  05/15/2014       . Esophagogastroduodenoscopy N/A 06/03/2014    Procedure: ESOPHAGOGASTRODUODENOSCOPY (EGD);  Surgeon: Beryle Beams, MD;  Location: [facility] ENDOSCOPY;  Service: Endoscopy;  Laterality: N/A;      Medication List       This list is accurate as of: 03/23/15 11:59 PM.  Always use your most recent med list.               bisacodyl 10 MG suppository  Commonly known as:  DULCOLAX  Place 10 mg rectally daily as needed for moderate constipation.     CENTAMIN Liqd  Place 5 mLs into feeding tube daily.     clonazePAM 0.5 MG tablet  Commonly known as:  KLONOPIN  Take 0.5 mg by mouth every 6 (six) hours as needed for anxiety.     clonazePAM 0.5 MG tablet  Commonly known as:  KLONOPIN  Take 0.5 mg by mouth at bedtime as needed (movement issues).     docusate 50 MG/5ML liquid  Commonly known as:   COLACE  Place 20 mLs (200 mg total) into feeding tube 2 (two) times daily.     famotidine 40 MG/5ML suspension  Commonly known as:  PEPCID  Place 20 mg into feeding tube daily.     furosemide 10 MG/ML solution  Commonly known as:  LASIX  Place 20 mg into feeding tube 2 (two) times daily.     levETIRAcetam 100 MG/ML solution  Commonly known as:  KEPPRA  Place 5 mLs (500 mg total) into feeding tube daily at 6 (six) AM.     levETIRAcetam 100 MG/ML solution  Commonly known as:  KEPPRA  750mg  every day at 1400hrs and 2000hrs     levothyroxine 100 MCG tablet  Commonly known as:  SYNTHROID, LEVOTHROID  Take 100 mcg by mouth daily before breakfast.     magnesium hydroxide 400 MG/5ML suspension  Commonly known as:  MILK OF MAGNESIA  Place 30 mLs into feeding tube daily as needed for mild constipation.     metoprolol tartrate 25 mg/10 mL Susp  Commonly known as:  LOPRESSOR  Take 12.5 mg by mouth 3 (three) times daily.     polyethylene glycol packet  Commonly known as:  MIRALAX / GLYCOLAX  Take 17 g by mouth daily.  Valproic Acid 250 MG/5ML Syrp syrup  Commonly known as:  DEPAKENE  Place 15 mLs (750 mg total) into feeding tube 3 (three) times daily.        No orders of the defined types were placed in this encounter.    Immunization History  Administered Date(s) Administered  . Influenza-Unspecified 08/13/2014    History  Substance Use Topics  . Smoking status: Never Smoker   . Smokeless tobacco: Never Used  . Alcohol Use: No    Review of Systems  DATA OBTAINED: from nurse- no changes or concerns   Filed Vitals:   03/23/15 1704  BP: 90/57  Pulse: 98  Temp: 97 F (36.1 C)  Resp: 20    Physical Exam  GENERAL APPEARANCE: eyes open,non conversant, No acute distress  SKIN: No diaphoresis rash HEENT: Unremarkable RESPIRATORY: Breathing is even, unlabored. Lung sounds are clear   CARDIOVASCULAR: Heart RRR no murmurs, rubs or gallops. No peripheral edema   GASTROINTESTINAL: Abdomen is soft, non-tender, not distended w/ normal bowel sounds.  GENITOURINARY: Bladder non tender, not distended  MUSCULOSKELETAL: contractures NEUROLOGIC: pt did not move or respond PSYCHIATRIC: n/a  Patient Active Problem List   Diagnosis Date Noted  . Severe mental retardation 01/10/2015  . Severe dementia 01/10/2015  . Senile debility 01/10/2015  . 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 Acute respiratory failure with hypoxia 11/20/2014  . Generalized weakness 11/20/2014  . Dysphagia 11/20/2014  . Dementia without behavioral disturbance 11/20/2014  . Development delay 11/02/2014  . Acute respiratory distress 11/02/2014  . Paroxysmal ventricular tachycardia 10/30/2014  . HCAP (healthcare-associated pneumonia) 10/26/2014  . Hematoma of right parietal scalp 08/13/2014  . Edema 08/09/2014  . Hematoma 08/05/2014  . Fall at nursing home 08/05/2014  . Leukocytosis 07/26/2014  . Tachycardia 07/11/2014  . Leukocytopenia, unspecified 07/11/2014  . Pneumonia 06/17/2014  . Sepsis 06/12/2014  . Facial droop 06/12/2014  . Hypernatremia 06/12/2014  . PEG (percutaneous endoscopic gastrostomy) status 06/12/2014  . FUO (fever of unknown origin) 06/02/2014  . Paroxysmal a-fib 05/14/2014  . Dysphasia 05/14/2014  . UTI (lower urinary tract infection) 05/14/2014  . Tooth decay 05/14/2014  . SIRS (systemic inflammatory response syndrome) 12/15/2013  . Thrombocytopenia 10/29/2013  . Hypotension 10/29/2013  . Anemia of chronic disease 10/29/2013  . Diastolic heart failure, NYHA class 2 10/29/2013  . Atrial fibrillation with rapid ventricular response 10/28/2013  . Seizure disorder 10/28/2013  . Hypothyroidism 10/28/2013  . HLD (hyperlipidemia) 10/28/2013  . Non-ST elevation myocardial infarction (NSTEMI) 10/28/2013    CBC    Component Value Date/Time   WBC 12.7* 11/12/2014 0614   WBC 20.8 10/19/2014   RBC 3.17* 11/12/2014 0614   RBC 1.74* 05/15/2014 0740    HGB 9.5* 11/12/2014 0614   HCT 29.6* 11/12/2014 0614   HCT 25.4* 11/03/2014 0725   PLT 449* 11/12/2014 0614   MCV 93.4 11/12/2014 0614   LYMPHSABS 2.7 11/12/2014 0614   MONOABS 1.7* 11/12/2014 0614   EOSABS 0.0 11/12/2014 0614   BASOSABS 0.0 11/12/2014 0614    CMP     Component Value Date/Time   NA 134* 11/12/2014 0614   NA 138 10/19/2014   K 4.4 11/12/2014 0614   CL 97 11/12/2014 0614   CO2 32 11/12/2014 0614   GLUCOSE 124* 11/12/2014 0614   BUN 9 11/12/2014 0614   BUN 26* 10/19/2014   CREATININE 0.54 11/12/2014 0614   CREATININE 0.4* 07/27/2014   CALCIUM 8.8 11/12/2014 0614   PROT 7.3 11/08/2014 0600   ALBUMIN  1.8* 11/08/2014 0600   AST 21 11/08/2014 0600   ALT 8 11/08/2014 0600   ALKPHOS 86 11/08/2014 0600   BILITOT 0.4 11/08/2014 0600   GFRNONAA >90 11/12/2014 0614   GFRAA >90 11/12/2014 0614    Assessment and Plan  Dysphagia PEG feedings only; pt's PEG tube has had multiple problems and it has been replaced 5/23  PEG (percutaneous endoscopic gastrostomy) status Pt's PEG tube has finally been replaced and will continue to use abdominal binder to keep it in place    Hennie Duos, MD

## 2015-03-26 ENCOUNTER — Encounter: Payer: Self-pay | Admitting: Internal Medicine

## 2015-03-26 NOTE — Assessment & Plan Note (Signed)
Pt's PEG tube has finally been replaced and will continue to use abdominal binder to keep it in place

## 2015-04-05 ENCOUNTER — Ambulatory Visit (HOSPITAL_COMMUNITY)
Admission: RE | Admit: 2015-04-05 | Discharge: 2015-04-05 | Disposition: A | Discharge status: Home / Self Care | Payer: Medicare Other | Source: Ambulatory Visit | Attending: Internal Medicine | Admitting: Internal Medicine

## 2015-04-05 ENCOUNTER — Other Ambulatory Visit: Payer: Self-pay | Admitting: Internal Medicine

## 2015-04-05 DIAGNOSIS — Z431 Encounter for attention to gastrostomy: Secondary | ICD-10-CM | POA: Insufficient documentation

## 2015-04-05 DIAGNOSIS — R1310 Dysphagia, unspecified: Secondary | ICD-10-CM

## 2015-04-05 MED ORDER — IOHEXOL 300 MG/ML  SOLN
50.0000 mL | Freq: Once | INTRAMUSCULAR | Status: AC | PRN
  Administered 2015-04-05: 10 mL

## 2015-04-05 MED ORDER — LIDOCAINE VISCOUS 2 % MT SOLN
OROMUCOSAL | Status: DC
Start: 2015-04-05 — End: 2015-04-06
  Filled 2015-04-05: qty 15

## 2015-04-05 NOTE — Procedures (Deleted)
Successful [place] suprapubic cath insertion No comp Stable Keep to gravity bag Full report in PACS

## 2015-04-05 NOTE — Procedures (Signed)
SUCCESSFUL 22 fr gtube replacement No comp Stable Ready for use Full report in PACS

## 2015-05-06 ENCOUNTER — Encounter: Payer: Self-pay | Admitting: Internal Medicine

## 2015-05-06 ENCOUNTER — Non-Acute Institutional Stay (SKILLED_NURSING_FACILITY): Payer: Medicare Other | Admitting: Internal Medicine

## 2015-05-06 DIAGNOSIS — I480 Paroxysmal atrial fibrillation: Secondary | ICD-10-CM | POA: Diagnosis not present

## 2015-05-06 DIAGNOSIS — I5030 Unspecified diastolic (congestive) heart failure: Secondary | ICD-10-CM

## 2015-05-06 DIAGNOSIS — G40909 Epilepsy, unspecified, not intractable, without status epilepticus: Secondary | ICD-10-CM | POA: Diagnosis not present

## 2015-05-06 NOTE — Progress Notes (Signed)
MRN: [id number] Name: Dawn [name]  Sex: female Age: 69 y.o. DOB: 1946-03-02  [place] [facility]: [name] Facility/Room:205 Level Of Care: SNF Provider: Inocencio Homes D Emergency Contacts: Extended Emergency Contact Information Primary Emergency Contact: Sellars,[name] Address: [address]          [place] [address] [place] Phone: [phone] Relation: Sister Secondary Emergency Contact: Sinda Cower [place] Phone: [phone] Relation: None  Code Status: DNR  Allergies: Carvedilol and Ppd  Chief Complaint  Patient presents with  . Medical Management of Chronic Issues    HPI: Patient is 69 y.o. female with CP, MR, paraplegia and dementia with PEG tube, placed with family consent, who goes from bed to chair to bed and that is the sum of her life. No reported changes. PEG tube had been a problem but it was finally changed altogether in 03/2015 with no problems since.  Past Medical History  Diagnosis Date  . Seizures   . Osteoporosis   . Thyroid disease     hypothyroid  . High cholesterol   . Cataracts, bilateral   . Optic atrophy   . Paraparesis     mild  . Hypothyroidism   . Anginal pain   . Coronary artery disease     Past Surgical History  Procedure Laterality Date  . Midline incision    . Central venous catheter insertion  05/15/2014       . Esophagogastroduodenoscopy N/A 06/03/2014    Procedure: ESOPHAGOGASTRODUODENOSCOPY (EGD);  Surgeon: Beryle Beams, MD;  Location: [facility] ENDOSCOPY;  Service: Endoscopy;  Laterality: N/A;      Medication List       This list is accurate as of: 05/06/15  3:47 PM.  Always use your most recent med list.               bisacodyl 10 MG suppository  Commonly known as:  DULCOLAX  Place 10 mg rectally daily as needed for moderate constipation.     CENTAMIN Liqd  Place 5 mLs into feeding tube daily.     clonazePAM 0.5 MG tablet  Commonly known as:  KLONOPIN  Take 0.5 mg by  mouth every 6 (six) hours as needed for anxiety.     clonazePAM 0.5 MG tablet  Commonly known as:  KLONOPIN  Take 0.5 mg by mouth at bedtime as needed (movement issues).     docusate 50 MG/5ML liquid  Commonly known as:  COLACE  Place 20 mLs (200 mg total) into feeding tube 2 (two) times daily.     famotidine 40 MG/5ML suspension  Commonly known as:  PEPCID  Place 20 mg into feeding tube daily.     furosemide 10 MG/ML solution  Commonly known as:  LASIX  Place 20 mg into feeding tube 2 (two) times daily.     levETIRAcetam 100 MG/ML solution  Commonly known as:  KEPPRA  Place 5 mLs (500 mg total) into feeding tube daily at 6 (six) AM.     levETIRAcetam 100 MG/ML solution  Commonly known as:  KEPPRA  750mg  every day at 1400hrs and 2000hrs     levothyroxine 100 MCG tablet  Commonly known as:  SYNTHROID, LEVOTHROID  Take 100 mcg by mouth daily before breakfast.     magnesium hydroxide 400 MG/5ML suspension  Commonly known as:  MILK OF MAGNESIA  Place 30 mLs into feeding tube daily as needed for mild constipation.     metoprolol tartrate 25 mg/10  mL Susp  Commonly known as:  LOPRESSOR  Take 12.5 mg by mouth 3 (three) times daily.     polyethylene glycol packet  Commonly known as:  MIRALAX / GLYCOLAX  Take 17 g by mouth daily.     Valproic Acid 250 MG/5ML Syrp syrup  Commonly known as:  DEPAKENE  Place 15 mLs (750 mg total) into feeding tube 3 (three) times daily.        No orders of the defined types were placed in this encounter.    Immunization History  Administered Date(s) Administered  . Influenza-Unspecified 08/13/2014    History  Substance Use Topics  . Smoking status: Never Smoker   . Smokeless tobacco: Never Used  . Alcohol Use: No    Review of Systems  DATA OBTAINED: from nurse GENERAL:  no fevers, fatigue, appetite changes SKIN: No itching, rash HEENT: Nothing new RESPIRATORY: No cough, wheezing, SOB CARDIAC: no  lower extremity edema  GI:  No abdominal pain, No N/V/D or constipation, No heartburn or reflux  GU: No dysuria, frequency or urgency, or incontinence  MUSCULOSKELETAL: No unrelieved bone/joint pain NEUROLOGIC: No change from baseline PSYCHIATRIC: Not applicable  Filed Vitals:   05/06/15 1537  BP: 90/57  Pulse: 92  Temp: 97.1 F (36.2 C)  Resp: 19    Physical Exam  GENERAL APPEARANCE: eyes open non conversant, No acute distress  SKIN: No diaphoresis rash HEENT: Unremarkable RESPIRATORY: Breathing is even, unlabored. Lung sounds are clear   CARDIOVASCULAR: Heart RRR no murmurs, rubs or gallops. No peripheral edema  GASTROINTESTINAL: Abdomen is soft, non-tender, not distended w/ normal bowel sounds.  GENITOURINARY: Bladder non tender, not distended  MUSCULOSKELETAL: wasting and contractures NEUROLOGIC: some non pur[poseful movement of UE, but non today PSYCHIATRIC: none  Patient Active Problem List   Diagnosis Date Noted  . Severe mental retardation 01/10/2015  . Severe dementia 01/10/2015  . Senile debility 01/10/2015  . 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 Acute respiratory failure with hypoxia 11/20/2014  . Generalized weakness 11/20/2014  . Dysphagia 11/20/2014  . Dementia without behavioral disturbance 11/20/2014  . Development delay 11/02/2014  . Acute respiratory distress 11/02/2014  . Paroxysmal ventricular tachycardia 10/30/2014  . HCAP (healthcare-associated pneumonia) 10/26/2014  . Hematoma of right parietal scalp 08/13/2014  . Edema 08/09/2014  . Hematoma 08/05/2014  . Fall at nursing home 08/05/2014  . Leukocytosis 07/26/2014  . Tachycardia 07/11/2014  . Leukocytopenia, unspecified 07/11/2014  . Pneumonia 06/17/2014  . Sepsis 06/12/2014  . Facial droop 06/12/2014  . Hypernatremia 06/12/2014  . PEG (percutaneous endoscopic gastrostomy) status 06/12/2014  . FUO (fever of unknown origin) 06/02/2014  . Paroxysmal a-fib 05/14/2014  . Dysphasia 05/14/2014  . UTI (lower urinary  tract infection) 05/14/2014  . Tooth decay 05/14/2014  . SIRS (systemic inflammatory response syndrome) 12/15/2013  . Thrombocytopenia 10/29/2013  . Hypotension 10/29/2013  . Anemia of chronic disease 10/29/2013  . Diastolic heart failure, NYHA class 2 10/29/2013  . Atrial fibrillation with rapid ventricular response 10/28/2013  . Seizure disorder 10/28/2013  . Hypothyroidism 10/28/2013  . HLD (hyperlipidemia) 10/28/2013  . Non-ST elevation myocardial infarction (NSTEMI) 10/28/2013    CBC    Component Value Date/Time   WBC 12.7* 11/12/2014 0614   WBC 20.8 10/19/2014   RBC 3.17* 11/12/2014 0614   RBC 1.74* 05/15/2014 0740   HGB 9.5* 11/12/2014 0614   HCT 29.6* 11/12/2014 0614   HCT 25.4* 11/03/2014 0725   PLT 449* 11/12/2014 0614   MCV 93.4 11/12/2014 9629  LYMPHSABS 2.7 11/12/2014 0614   MONOABS 1.7* 11/12/2014 0614   EOSABS 0.0 11/12/2014 0614   BASOSABS 0.0 11/12/2014 0614    CMP     Component Value Date/Time   NA 134* 11/12/2014 0614   NA 138 10/19/2014   K 4.4 11/12/2014 0614   CL 97 11/12/2014 0614   CO2 32 11/12/2014 0614   GLUCOSE 124* 11/12/2014 0614   BUN 9 11/12/2014 0614   BUN 26* 10/19/2014   CREATININE 0.54 11/12/2014 0614   CREATININE 0.4* 07/27/2014   CALCIUM 8.8 11/12/2014 0614   PROT 7.3 11/08/2014 0600   ALBUMIN 1.8* 11/08/2014 0600   AST 21 11/08/2014 0600   ALT 8 11/08/2014 0600   ALKPHOS 86 11/08/2014 0600   BILITOT 0.4 11/08/2014 0600   GFRNONAA >90 11/12/2014 0614   GFRAA >90 11/12/2014 0614    Assessment and Plan  Diastolic heart failure, NYHA class 2 No exacerbations since lasr visit;plan - cont lopressor 12.5 TID and lasix 20 mg daily  Paroxysmal a-fib Pt has not been known to be in afib since last visit; Plan - cont metoprolol 12.5 mg TID; pt is on no prophylaxis  Seizure disorder No seizure activity reported;plan - cont Keppra 500 mg daily and 750 mg daily and valproic acid 750 mg TID; valproic acid level 55.9 in  12/2014    Hennie Duos, MD

## 2015-05-06 NOTE — Assessment & Plan Note (Signed)
No exacerbations since lasr visit;plan - cont lopressor 12.5 TID and lasix 20 mg daily

## 2015-05-06 NOTE — Assessment & Plan Note (Signed)
Pt has not been known to be in afib since last visit; Plan - cont metoprolol 12.5 mg TID; pt is on no prophylaxis

## 2015-05-06 NOTE — Assessment & Plan Note (Signed)
No seizure activity reported;plan - cont Keppra 500 mg daily and 750 mg daily and valproic acid 750 mg TID; valproic acid level 55.9 in 12/2014

## 2015-06-02 ENCOUNTER — Non-Acute Institutional Stay (SKILLED_NURSING_FACILITY): Payer: Medicare Other | Admitting: Internal Medicine

## 2015-06-02 ENCOUNTER — Encounter: Payer: Self-pay | Admitting: Internal Medicine

## 2015-06-02 DIAGNOSIS — G40909 Epilepsy, unspecified, not intractable, without status epilepticus: Secondary | ICD-10-CM

## 2015-06-02 DIAGNOSIS — R1310 Dysphagia, unspecified: Secondary | ICD-10-CM | POA: Diagnosis not present

## 2015-06-02 DIAGNOSIS — E038 Other specified hypothyroidism: Secondary | ICD-10-CM

## 2015-06-02 DIAGNOSIS — I4891 Unspecified atrial fibrillation: Secondary | ICD-10-CM

## 2015-06-02 DIAGNOSIS — I5030 Unspecified diastolic (congestive) heart failure: Secondary | ICD-10-CM | POA: Diagnosis not present

## 2015-06-02 NOTE — Progress Notes (Signed)
Patient ID: Dawn [name], female   DOB: 1945-12-20, 69 y.o.   MRN: [id number] MRN: [id number] Name: Dawn [name]  Sex: female Age: 69 y.o. DOB: [birth year]  [facility]: [name] Facility/Room:205 Level Of Care: SNF Provider: Wille Celeste Emergency Contacts: Extended Emergency Contact Information Primary Emergency Contact: Sellars,[name] Address: [place]          [place] [address] [place] Phone: [phone] Relation: Sister Secondary Emergency Contact: Saya Cower [place] Phone: [phone] Relation: None  Code Status: DNR  Allergies: Carvedilol and Ppd  Chief Complaint  Patient presents with  . Medical Management of Chronic Issues    HPI: Patient is 69 y.o. female with CP, MR, paraplegia and dementia with PEG tube, placed with family consent . No reported changes. PEG tube had been a problem but it was finally changed altogether in 03/2015 with no problems since. Nursing staff does not report any recent issues.  In the past she has had some tachycardia but this is been stable for some time she is on a beta blocker with a history of atrial fibrillation.  There've been no reported history of recent seizures  Past Medical History  Diagnosis Date  . Seizures   . Osteoporosis   . Thyroid disease     hypothyroid  . High cholesterol   . Cataracts, bilateral   . Optic atrophy   . Paraparesis     mild  . Hypothyroidism   . Anginal pain   . Coronary artery disease     Past Surgical History  Procedure Laterality Date  . Midline incision    . Central venous catheter insertion  05/15/2014       . Esophagogastroduodenoscopy N/A 06/03/2014    Procedure: ESOPHAGOGASTRODUODENOSCOPY (EGD);  Surgeon: Beryle Beams, MD;  Location: [facility] ENDOSCOPY;  Service: Endoscopy;  Laterality: N/A;      Medication List       This list is accurate as of: 06/02/15 11:59 PM.  Always use your most recent med list.                bisacodyl 10 MG suppository  Commonly known as:  DULCOLAX  Place 10 mg rectally daily as needed for moderate constipation.     CENTAMIN Liqd  Place 5 mLs into feeding tube daily.     clonazePAM 0.5 MG tablet  Commonly known as:  KLONOPIN  Take 0.5 mg by mouth every 6 (six) hours as needed for anxiety.     clonazePAM 0.5 MG tablet  Commonly known as:  KLONOPIN  Take 0.5 mg by mouth at bedtime as needed (movement issues).     docusate 50 MG/5ML liquid  Commonly known as:  COLACE  Place 20 mLs (200 mg total) into feeding tube 2 (two) times daily.     famotidine 40 MG/5ML suspension  Commonly known as:  PEPCID  Place 20 mg into feeding tube daily.     furosemide 10 MG/ML solution  Commonly known as:  LASIX  Place 20 mg into feeding tube 2 (two) times daily.     levETIRAcetam 100 MG/ML solution  Commonly known as:  KEPPRA  Place 5 mLs (500 mg total) into feeding tube daily at 6 (six) AM.     levETIRAcetam 100 MG/ML solution  Commonly known as:  KEPPRA  750mg  every day at 1400hrs and 2000hrs     levothyroxine 100 MCG tablet  Commonly known as:  SYNTHROID, LEVOTHROID  Take 100 mcg by mouth daily before breakfast.     magnesium hydroxide 400 MG/5ML suspension  Commonly known as:  MILK OF MAGNESIA  Place 30 mLs into feeding tube daily as needed for mild constipation.     metoprolol tartrate 25 mg/10 mL Susp  Commonly known as:  LOPRESSOR  Take 12.5 mg by mouth 3 (three) times daily.     polyethylene glycol packet  Commonly known as:  MIRALAX / GLYCOLAX  Take 17 g by mouth daily.     Valproic Acid 250 MG/5ML Syrp syrup  Commonly known as:  DEPAKENE  Place 15 mLs (750 mg total) into feeding tube 3 (three) times daily.          Immunization History  Administered Date(s) Administered  . Influenza-Unspecified 08/13/2014    Social History  Substance Use Topics  . Smoking status: Never Smoker   . Smokeless tobacco: Never Used  . Alcohol Use: No    Review of  Systems  DATA OBTAINED: from nurse GENERAL:  no fevers, fatigue, appetite changes SKIN: No itching, rash HEENT: No reported changes RESPIRATORY: No cough, wheezing, SOB CARDIAC: no  lower extremity edema  GI: No abdominal pain, No N/V/D or constipation, No heartburn or reflux  GU: No dysuria, frequency or urgency, or incontinence  MUSCULOSKELETAL: No unrelieved bone/joint pain NEUROLOGIC: No change from baseline PSYCHIATRIC: Not applicable-  Does -not respond  Filed Vitals:   06/02/15 2143  BP: 118/71  Pulse: 90  Temp: 97.1 F (36.2 C)  Resp: 19    Physical Exam  GENERAL APPEARANCE: eyes open non conversant, No acute distress  SKIN: No diaphoresis rash HEENT: Unremarkable RESPIRATORY: Breathing is even, unlabored. Lung sounds are clear   CARDIOVASCULAR: Heart RRR no murmurs, rubs or gallops. No peripheral edema  GASTROINTESTINAL: Abdomen is soft, non-tender, not distended w/ normal bowel sounds -PEG site appears unremarkable there is a small White's streak of drainage but this does not appear to be significant at this point. Did not note any surrounding erythema or firmness  GENITOURINARY: Bladder non tender, not distended  MUSCULOSKELETAL: wasting and contractures NEUROLOGIC: some non pur[poseful movement of UE, PSYCHIATRIC: Does not respond  Patient Active Problem List   Diagnosis Date Noted  . Severe mental retardation 01/10/2015  . Severe dementia 01/10/2015  . Senile debility 01/10/2015  . 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 Acute respiratory failure with hypoxia 11/20/2014  . Generalized weakness 11/20/2014  . Dysphagia 11/20/2014  . Dementia without behavioral disturbance 11/20/2014  . Development delay 11/02/2014  . Acute respiratory distress 11/02/2014  . Paroxysmal ventricular tachycardia 10/30/2014  . HCAP (healthcare-associated pneumonia) 10/26/2014  . Hematoma of right parietal scalp 08/13/2014  . Edema 08/09/2014  . Hematoma 08/05/2014  . Fall  at nursing home 08/05/2014  . Leukocytosis 07/26/2014  . Tachycardia 07/11/2014  . Leukocytopenia, unspecified 07/11/2014  . Pneumonia 06/17/2014  . Sepsis 06/12/2014  . Facial droop 06/12/2014  . Hypernatremia 06/12/2014  . PEG (percutaneous endoscopic gastrostomy) status 06/12/2014  . FUO (fever of unknown origin) 06/02/2014  . Paroxysmal a-fib 05/14/2014  . Dysphasia 05/14/2014  . UTI (lower urinary tract infection) 05/14/2014  . Tooth decay 05/14/2014  . SIRS (systemic inflammatory response syndrome) 12/15/2013  . Thrombocytopenia 10/29/2013  . Hypotension 10/29/2013  . Anemia of chronic disease 10/29/2013  . Diastolic heart failure, NYHA class 2 10/29/2013  . Atrial fibrillation with rapid ventricular response 10/28/2013  . Seizure disorder 10/28/2013  . Hypothyroidism 10/28/2013  . HLD (hyperlipidemia) 10/28/2013  .  Non-ST elevation myocardial infarction (NSTEMI) 10/28/2013    Labs.  05/09/2015.  Hemoglobin A1c 5.1.  Liver function tests within normal limits except alkaline phosphatase minimally elevated at 125.  Sodium 136 potassium 3.3 BUN 23 creatinine 0.5-CO2 level XXXVI.  Direct bilirubin 0.1.  WBC 11.0 hemoglobin 12.0 platelets 247 12/27/2014.  TSH-3.041.  12/14/2014.  Depakote level LV.9.    CBC    Component Value Date/Time   WBC 12.7* 11/12/2014 0614   WBC 20.8 10/19/2014   RBC 3.17* 11/12/2014 0614   RBC 1.74* 05/15/2014 0740   HGB 9.5* 11/12/2014 0614   HCT 29.6* 11/12/2014 0614   HCT 25.4* 11/03/2014 0725   PLT 449* 11/12/2014 0614   MCV 93.4 11/12/2014 0614   LYMPHSABS 2.7 11/12/2014 0614   MONOABS 1.7* 11/12/2014 0614   EOSABS 0.0 11/12/2014 0614   BASOSABS 0.0 11/12/2014 0614    CMP     Component Value Date/Time   NA 134* 11/12/2014 0614   NA 138 10/19/2014   K 4.4 11/12/2014 0614   CL 97 11/12/2014 0614   CO2 32 11/12/2014 0614   GLUCOSE 124* 11/12/2014 0614   BUN 9 11/12/2014 0614   BUN 26* 10/19/2014   CREATININE  0.54 11/12/2014 0614   CREATININE 0.4* 07/27/2014   CALCIUM 8.8 11/12/2014 0614   PROT 7.3 11/08/2014 0600   ALBUMIN 1.8* 11/08/2014 0600   AST 21 11/08/2014 0600   ALT 8 11/08/2014 0600   ALKPHOS 86 11/08/2014 0600   BILITOT 0.4 11/08/2014 0600   GFRNONAA >90 11/12/2014 0614   GFRAA >90 11/12/2014 0614    Assessment and Plan  History of diastolic CHF class II-this is been stable she is on low-dose Lasix 20 mg a day and Lopressor 12.5 mg 3 times a day.  Atrial fibrillation is rate controlled on the Lopressor as noted above not on prophylaxis secondary to patient's significant dementia and comorbidities.  #3 seizure disorder continues on Keppra as well as Depakote will update a Depakote level last noted 55.9 in March 2016   #4-history hypothyroidism is on Synthroid will recheck a TSH.  #5-history of dysphasia she is chronic PEG tube at this point PEG site appears stable will need to be monitored for any sign of infection.  #6-history of mild hypokalemia on recent lab will recheck this and add potassium 20 mEq a day for now.  Anemia we will update a CBC as well--hemoglobin 12.0 last month.  .  [id number]   Jaxyn Mestas C,

## 2015-06-10 IMAGING — CR DG ABD PORTABLE 1V
1 series · 1 of 1 positions shown · non-contrast
Comparison: None.

CLINICAL DATA: Feeding tube placement

EXAM:
PORTABLE ABDOMEN - 1 VIEW

[AP]
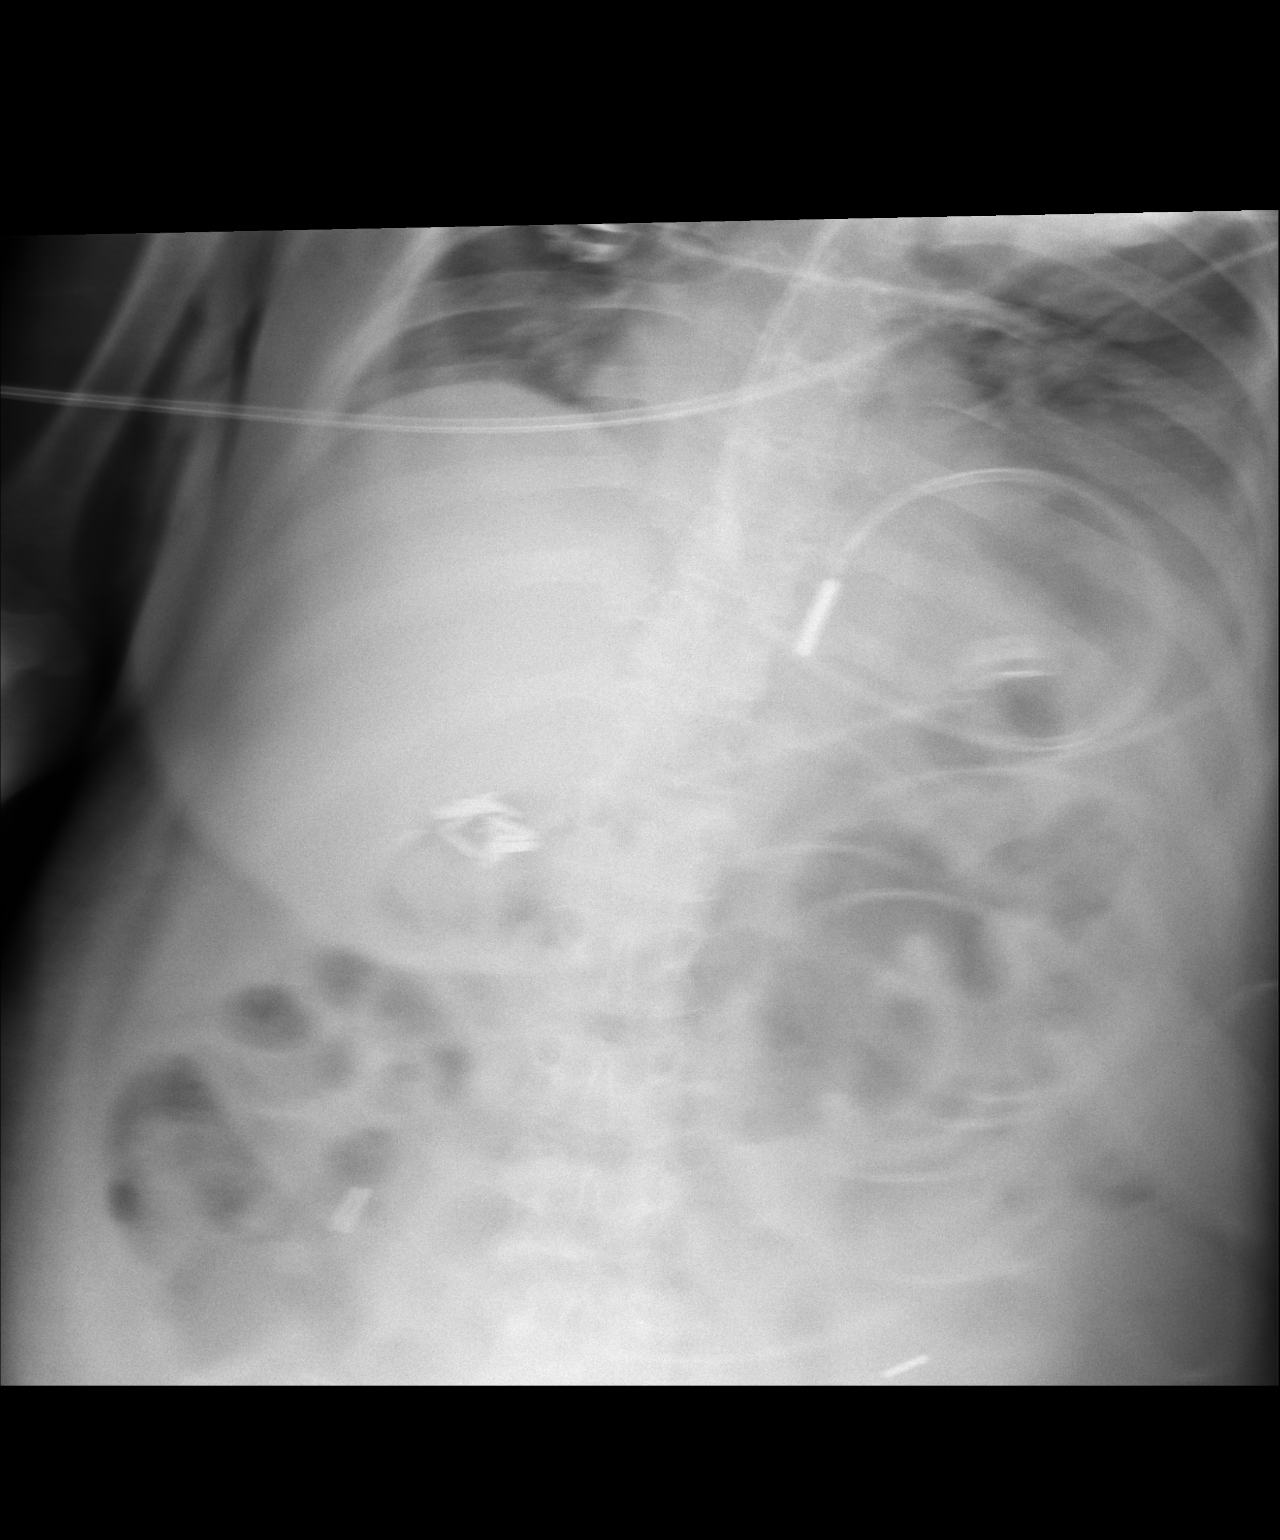

[1 of 1 positions shown; findings below may reference images not displayed]

FINDINGS: Feeding tube tip is in the stomach. The bowel gas pattern is
unremarkable. No obstruction or free air is seen on this supine
examination.
IMPRESSION: Feeding tube tip in stomach.

## 2015-06-12 IMAGING — CR DG CHEST 1V PORT
1 series · 1 of 1 positions shown · non-contrast
Comparison: 05/15/2014

CLINICAL DATA: Wheezing

EXAM:
PORTABLE CHEST - 1 VIEW

[AP]
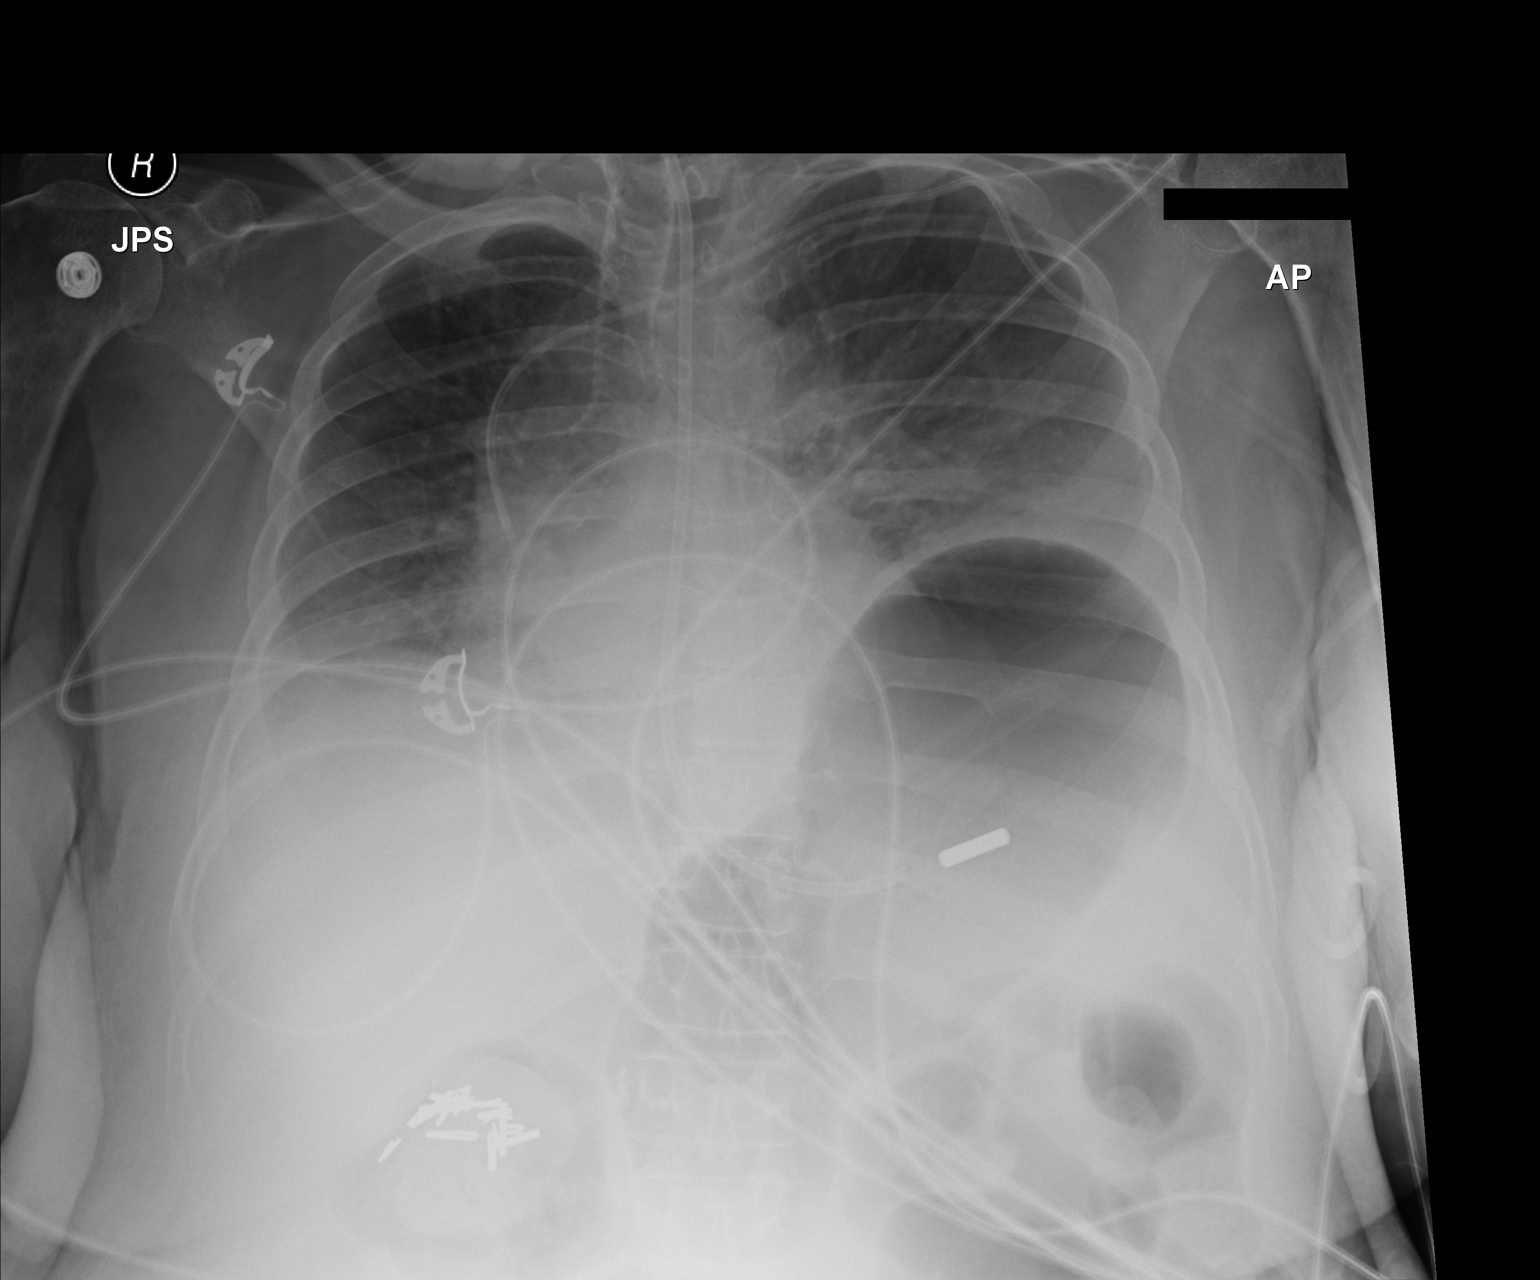

[1 of 1 positions shown; findings below may reference images not displayed]

FINDINGS: A left subclavian central venous line is again identified. A feeding
tube is now seen within the stomach. The cardiac shadow is stable.
Bibasilar atelectasis is noted as well as small pleural effusions
bilaterally.
IMPRESSION: Bibasilar changes increased from the prior exam.

## 2015-06-15 IMAGING — CR DG CHEST 1V PORT
1 series · 1 of 1 positions shown · non-contrast
Comparison: 05/21/2014

CLINICAL DATA: Fever, possible aspiration

EXAM:
PORTABLE CHEST - 1 VIEW

[portable]
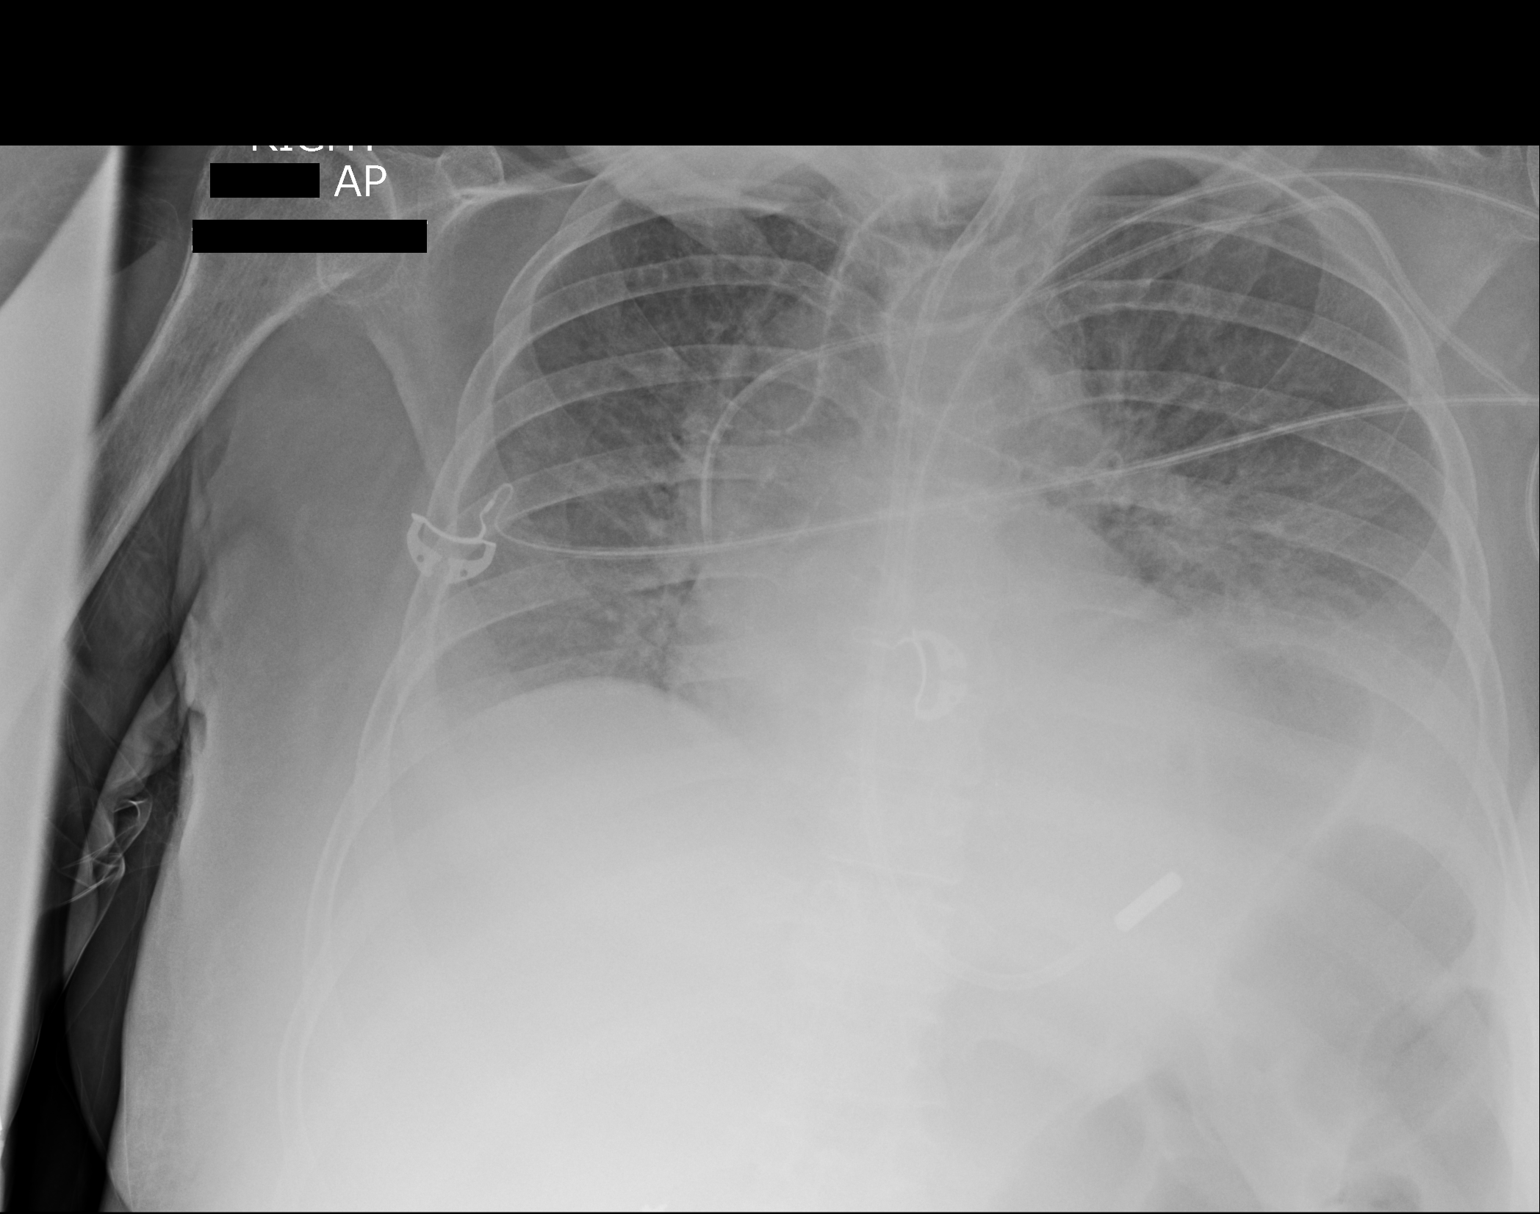

[1 of 1 positions shown; findings below may reference images not displayed]

FINDINGS: No change in position of left central line or NG tube.

Limited inspiratory effect with elevated left diaphragm similar to
prior study. Heart size is normal. Hazy opacity right lung base,
mild and stable. Left, there is also hazy basilar opacity, stable.
Although somewhat likely exaggerated by limited inspiratory effect,
there appears to be mild vascular congestion.
IMPRESSION: Hazy bibasilar opacities unchanged from prior study.

Mild vascular congestion.

## 2015-06-18 IMAGING — CR DG ABD PORTABLE 1V
1 series · 1 of 1 positions shown · non-contrast
Comparison: Single view of the abdomen 05/25/2014 and 05/19/2014.

CLINICAL DATA: Asses stool burden.

EXAM:
PORTABLE ABDOMEN - 1 VIEW

[AP]
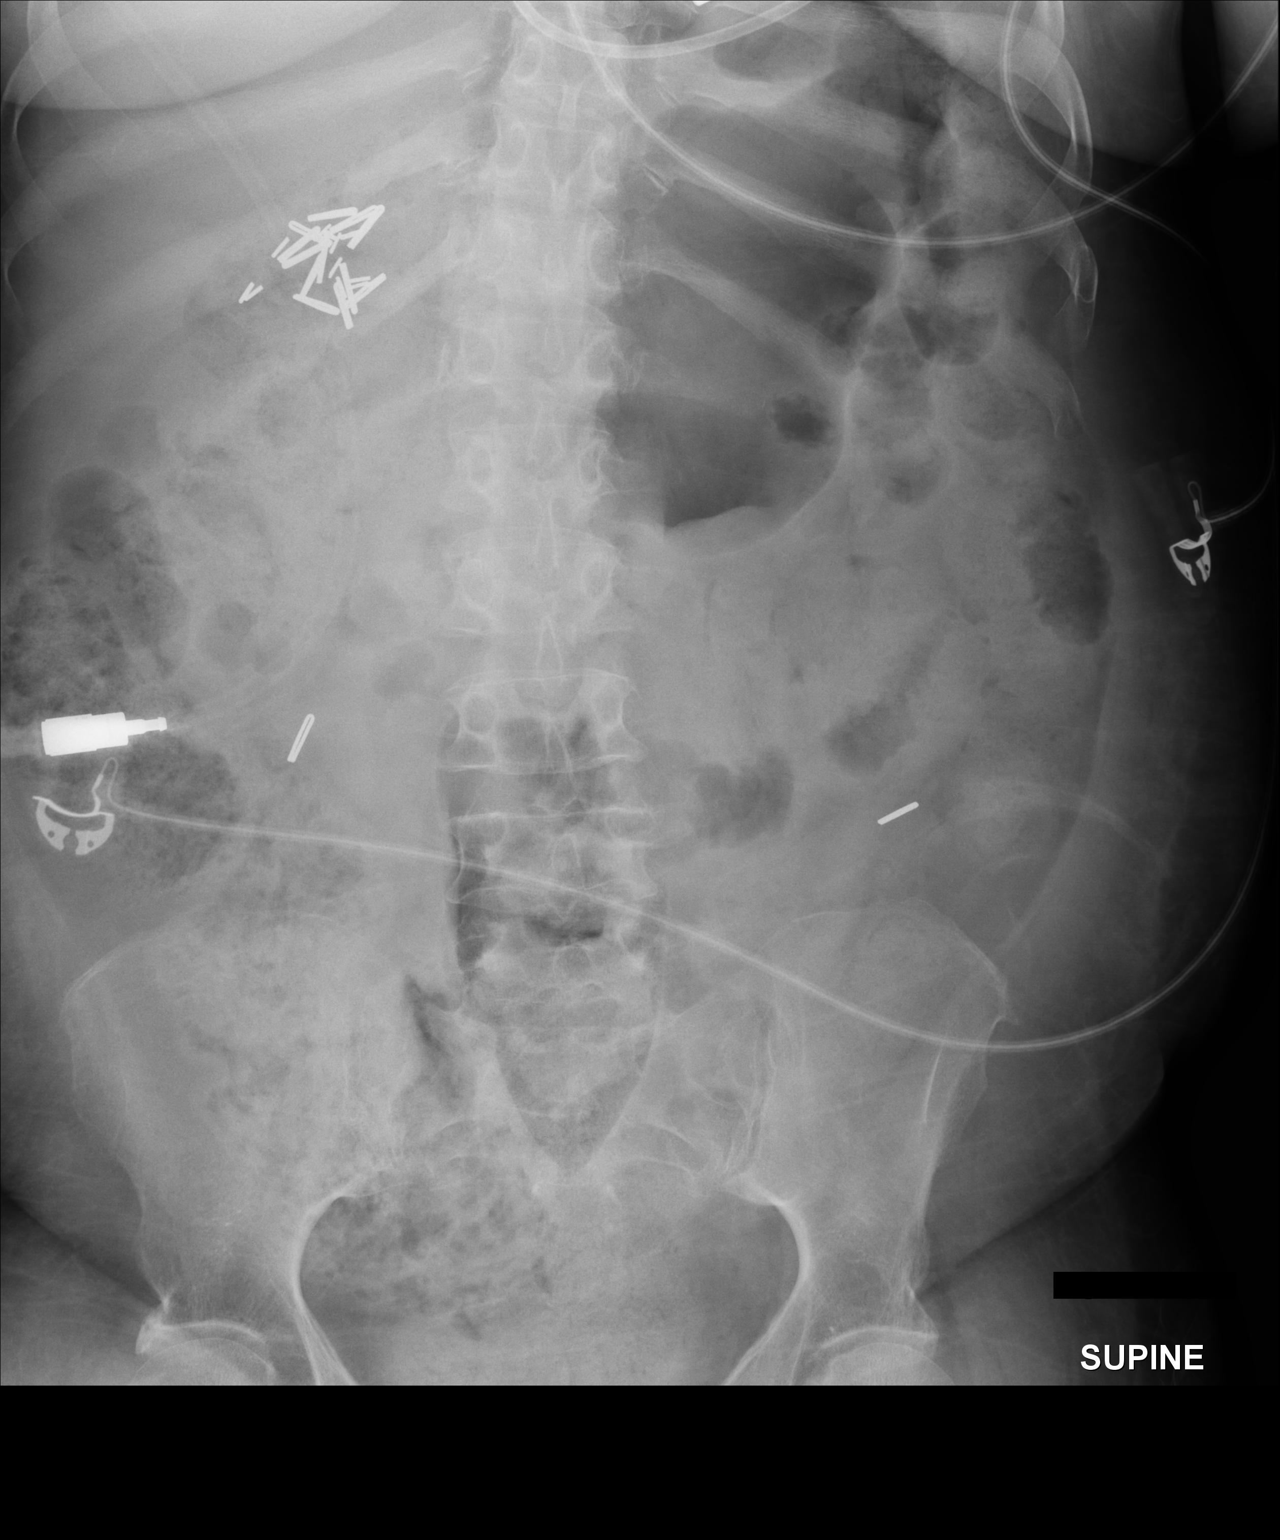

[1 of 1 positions shown; findings below may reference images not displayed]

FINDINGS: Although there remains a large volume of stool in the colon,
particularly the ascending and transverse, overall stool burden
appears mildly improved. There is no evidence of bowel obstruction.
IMPRESSION: Large stool burden for cyst but appears mildly improved.

## 2015-06-27 ENCOUNTER — Other Ambulatory Visit: Payer: Self-pay | Admitting: *Deleted

## 2015-06-27 MED ORDER — CLONAZEPAM 0.5 MG PO TABS: ORAL_TABLET | ORAL | Status: DC

## 2015-06-27 NOTE — Telephone Encounter (Signed)
[facility] 

## 2015-07-15 ENCOUNTER — Non-Acute Institutional Stay (SKILLED_NURSING_FACILITY): Payer: Medicare Other | Admitting: Internal Medicine

## 2015-07-15 DIAGNOSIS — F0390 Unspecified dementia without behavioral disturbance: Secondary | ICD-10-CM | POA: Diagnosis not present

## 2015-07-15 DIAGNOSIS — F72 Severe intellectual disabilities: Secondary | ICD-10-CM

## 2015-07-15 DIAGNOSIS — R1310 Dysphagia, unspecified: Secondary | ICD-10-CM

## 2015-07-15 DIAGNOSIS — F03C0 Unspecified dementia, severe, without behavioral disturbance, psychotic disturbance, mood disturbance, and anxiety: Secondary | ICD-10-CM

## 2015-07-15 NOTE — Progress Notes (Signed)
MRN: [id number] Name: Dawn [name]  Sex: female Age: 69 y.o. DOB: 02-14-1946  [place] #: [name] Facility/Room: Level Of Care: SNF Provider: Inocencio Homes D Emergency Contacts: Extended Emergency Contact Information Primary Emergency Contact: Sellars,[name] Address: [place]          [place] [address] [place] Phone: [phone] Relation: Sister Secondary Emergency Contact: Damonica Cower [place] Phone: [phone] Relation: None  Code Status:   Allergies: Carvedilol and Ppd  Chief Complaint  Patient presents with  . Medical Management of Chronic Issues    HPI: Patient is 69 y.o. female chronic vegetative state being seen for MR, dementia and dysphagia.  Past Medical History  Diagnosis Date  . Seizures ([place])   . Osteoporosis   . Thyroid disease     hypothyroid  . High cholesterol   . Cataracts, bilateral   . Optic atrophy   . Paraparesis ([facility])     mild  . Hypothyroidism   . Anginal pain ([place])   . Coronary artery disease     Past Surgical History  Procedure Laterality Date  . Midline incision    . Central venous catheter insertion  05/15/2014       . Esophagogastroduodenoscopy N/A 06/03/2014    Procedure: ESOPHAGOGASTRODUODENOSCOPY (EGD);  Surgeon: Beryle Beams, MD;  Location: [facility] ENDOSCOPY;  Service: Endoscopy;  Laterality: N/A;      Medication List       This list is accurate as of: 07/15/15 11:59 PM.  Always use your most recent med list.               bisacodyl 10 MG suppository  Commonly known as:  DULCOLAX  Place 10 mg rectally daily as needed for moderate constipation.     CENTAMIN Liqd  Place 5 mLs into feeding tube daily.     clonazePAM 0.5 MG tablet  Commonly known as:  KLONOPIN  Take 0.5 mg by mouth every 6 (six) hours as needed for anxiety.     clonazePAM 0.5 MG tablet  Commonly known as:  KLONOPIN  Take one tablet via tube at bedtime scheduled for rest     docusate 50  MG/5ML liquid  Commonly known as:  COLACE  Place 20 mLs (200 mg total) into feeding tube 2 (two) times daily.     famotidine 40 MG/5ML suspension  Commonly known as:  PEPCID  Place 20 mg into feeding tube daily.     furosemide 10 MG/ML solution  Commonly known as:  LASIX  Place 20 mg into feeding tube 2 (two) times daily.     levETIRAcetam 100 MG/ML solution  Commonly known as:  KEPPRA  Place 5 mLs (500 mg total) into feeding tube daily at 6 (six) AM.     levETIRAcetam 100 MG/ML solution  Commonly known as:  KEPPRA  750mg  every day at 1400hrs and 2000hrs     levothyroxine 100 MCG tablet  Commonly known as:  SYNTHROID, LEVOTHROID  Take 100 mcg by mouth daily before breakfast.     magnesium hydroxide 400 MG/5ML suspension  Commonly known as:  MILK OF MAGNESIA  Place 30 mLs into feeding tube daily as needed for mild constipation.     metoprolol tartrate 25 mg/10 mL Susp  Commonly known as:  LOPRESSOR  Take 12.5 mg by mouth 3 (three) times daily.     polyethylene glycol packet  Commonly known as:  MIRALAX / GLYCOLAX  Take 17 g by mouth  daily.     Valproic Acid 250 MG/5ML Syrp syrup  Commonly known as:  DEPAKENE  Place 15 mLs (750 mg total) into feeding tube 3 (three) times daily.        No orders of the defined types were placed in this encounter.    Immunization History  Administered Date(s) Administered  . Influenza-Unspecified 08/13/2014    Social History  Substance Use Topics  . Smoking status: Never Smoker   . Smokeless tobacco: Never Used  . Alcohol Use: No    Review of Systems  DATA OBTAINED: from nurse- no concerns GENERAL:  no fevers, fatigue, appetite changes SKIN: No itching, rash HEENT: No complaint RESPIRATORY: No cough, wheezing, SOB CARDIAC: No  lower extremity edema  GI: No abdominal pain, No N/V/D or constipation, GU: No  frequency , no foul urine MUSCULOSKELETAL: No change NEUROLOGIC: No changes PSYCHIATRIC - n/a  Filed Vitals:    07/16/15 1535  BP: 90/57  Pulse: 89  Temp: 96.5 F (35.8 C)  Resp: 20    Physical Exam  GENERAL APPEARANCE: BF No acute distress  SKIN: No diaphoresis rash HEENT: Unremarkable RESPIRATORY: Breathing is even, unlabored. Lung sounds are clear   CARDIOVASCULAR: Heart RRR no murmurs, rubs or gallops. No peripheral edema  GASTROINTESTINAL: Abdomen is soft, non-tender, not distended w/ normal bowel sounds.  GENITOURINARY: Bladder non tender, not distended  MUSCULOSKELETAL: wasting ,contractures NEUROLOGIC: some unpurposeful movement of upper ext PSYCHIATRIC: n/a  Patient Active Problem List   Diagnosis Date Noted  . Severe mental retardation 01/10/2015  . Severe dementia 01/10/2015  . Senile debility 01/10/2015  . 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 Acute respiratory failure with hypoxia 11/20/2014  . Generalized weakness 11/20/2014  . Dysphagia 11/20/2014  . Dementia without behavioral disturbance 11/20/2014  . Development delay 11/02/2014  . Acute respiratory distress 11/02/2014  . Paroxysmal ventricular tachycardia 10/30/2014  . HCAP (healthcare-associated pneumonia) 10/26/2014  . Hematoma of right parietal scalp 08/13/2014  . Edema 08/09/2014  . Hematoma 08/05/2014  . Fall at nursing home 08/05/2014  . Leukocytosis 07/26/2014  . Tachycardia 07/11/2014  . Leukocytopenia, unspecified 07/11/2014  . Pneumonia 06/17/2014  . Sepsis 06/12/2014  . Facial droop 06/12/2014  . Hypernatremia 06/12/2014  . PEG (percutaneous endoscopic gastrostomy) status 06/12/2014  . FUO (fever of unknown origin) 06/02/2014  . Paroxysmal a-fib 05/14/2014  . Dysphasia 05/14/2014  . UTI (lower urinary tract infection) 05/14/2014  . Tooth decay 05/14/2014  . SIRS (systemic inflammatory response syndrome) 12/15/2013  . Thrombocytopenia 10/29/2013  . Hypotension 10/29/2013  . Anemia of chronic disease 10/29/2013  . Diastolic heart failure, NYHA class 2 10/29/2013  . Atrial fibrillation with  rapid ventricular response 10/28/2013  . Seizure disorder 10/28/2013  . Hypothyroidism 10/28/2013  . HLD (hyperlipidemia) 10/28/2013  . Non-ST elevation myocardial infarction (NSTEMI) 10/28/2013    CBC    Component Value Date/Time   WBC 12.7* 11/12/2014 0614   WBC 20.8 10/19/2014   RBC 3.17* 11/12/2014 0614   RBC 1.74* 05/15/2014 0740   HGB 9.5* 11/12/2014 0614   HCT 29.6* 11/12/2014 0614   HCT 25.4* 11/03/2014 0725   PLT 449* 11/12/2014 0614   MCV 93.4 11/12/2014 0614   LYMPHSABS 2.7 11/12/2014 0614   MONOABS 1.7* 11/12/2014 0614   EOSABS 0.0 11/12/2014 0614   BASOSABS 0.0 11/12/2014 0614    CMP     Component Value Date/Time   NA 134* 11/12/2014 0614   NA 138 10/19/2014   K 4.4 11/12/2014 3220  CL 97 11/12/2014 0614   CO2 32 11/12/2014 0614   GLUCOSE 124* 11/12/2014 0614   BUN 9 11/12/2014 0614   BUN 26* 10/19/2014   CREATININE 0.54 11/12/2014 0614   CREATININE 0.4* 07/27/2014   CALCIUM 8.8 11/12/2014 0614   PROT 7.3 11/08/2014 0600   ALBUMIN 1.8* 11/08/2014 0600   AST 21 11/08/2014 0600   ALT 8 11/08/2014 0600   ALKPHOS 86 11/08/2014 0600   BILITOT 0.4 11/08/2014 0600   GFRNONAA >90 11/12/2014 0614   GFRAA >90 11/12/2014 0614    Assessment and Plan  Dysphagia Chronic , 2/2 MR/dementia; unchanging; Plan - PEG feedings for life  Severe dementia Chronic unchanging; Plan - supportive care with PEG as per family wishes  Severe mental retardation Chronic unchanging for life; Plan - supportive care ,PEG as per family wishes    Hennie Duos, MD

## 2015-07-16 ENCOUNTER — Encounter: Payer: Self-pay | Admitting: Internal Medicine

## 2015-07-16 NOTE — Assessment & Plan Note (Signed)
Chronic unchanging; Plan - supportive care with PEG as per family wishes

## 2015-07-16 NOTE — Assessment & Plan Note (Signed)
Chronic unchanging for life; Plan - supportive care ,PEG as per family wishes

## 2015-07-16 NOTE — Assessment & Plan Note (Signed)
Chronic , 2/2 MR/dementia; unchanging; Plan - PEG feedings for life

## 2015-07-30 ENCOUNTER — Encounter (HOSPITAL_COMMUNITY): Payer: Self-pay | Admitting: *Deleted

## 2015-07-30 ENCOUNTER — Emergency Department (HOSPITAL_COMMUNITY): Payer: Medicare Other

## 2015-07-30 ENCOUNTER — Emergency Department (HOSPITAL_COMMUNITY)
Admission: EM | Admit: 2015-07-30 | Discharge: 2015-07-30 | Disposition: A | Discharge status: Home / Self Care | Payer: Medicare Other | Attending: Emergency Medicine | Admitting: Emergency Medicine

## 2015-07-30 DIAGNOSIS — I2510 Atherosclerotic heart disease of native coronary artery without angina pectoris: Secondary | ICD-10-CM | POA: Insufficient documentation

## 2015-07-30 DIAGNOSIS — T85528A Displacement of other gastrointestinal prosthetic devices, implants and grafts, initial encounter: Secondary | ICD-10-CM

## 2015-07-30 DIAGNOSIS — E039 Hypothyroidism, unspecified: Secondary | ICD-10-CM | POA: Insufficient documentation

## 2015-07-30 DIAGNOSIS — Z79899 Other long term (current) drug therapy: Secondary | ICD-10-CM | POA: Diagnosis not present

## 2015-07-30 DIAGNOSIS — Z434 Encounter for attention to other artificial openings of digestive tract: Secondary | ICD-10-CM

## 2015-07-30 DIAGNOSIS — F0390 Unspecified dementia without behavioral disturbance: Secondary | ICD-10-CM | POA: Insufficient documentation

## 2015-07-30 DIAGNOSIS — Y732 Prosthetic and other implants, materials and accessory gastroenterology and urology devices associated with adverse incidents: Secondary | ICD-10-CM | POA: Insufficient documentation

## 2015-07-30 DIAGNOSIS — Z8739 Personal history of other diseases of the musculoskeletal system and connective tissue: Secondary | ICD-10-CM | POA: Insufficient documentation

## 2015-07-30 DIAGNOSIS — G40909 Epilepsy, unspecified, not intractable, without status epilepticus: Secondary | ICD-10-CM | POA: Insufficient documentation

## 2015-07-30 NOTE — ED Provider Notes (Signed)
Care assumed from Dr. Claudine Mouton at shift change. Patient was sent here due to a dislodged feeding tube. A tube was unable to be replaced and a consult was to be made to interventional radiology, however before this was performed that was able to place a temporary Foley catheter. This will be used in the interim until follow-up with GI and not emergent replacement can be performed. The tube placement was confirmed with Gastrografin and x-rays.  Veryl Speak, MD 07/30/15 [phone]

## 2015-07-30 NOTE — Discharge Instructions (Signed)
Percutaneous Endoscopic Jejunostomy Percutaneous endoscopic jejunostomy is a surgical procedure to place a feeding tube in the upper part of your small intestine (jejunum). This feeding tube supplies the nutrients you are not getting from eating or drinking. You may need this procedure if you cannot get enough nutrition by mouth and this situation is expected to last longer than 30 days.  LET [facility] CARE PROVIDER KNOW ABOUT:   Any allergies you have.  All medicines you are taking, including vitamins, herbs, eye drops, creams, and over-the-counter medicines.  Previous problems you or members of your family have had with the use of anesthetics.  Any blood disorders you have.  Previous surgeries you have had.  Medical conditions you have. RISKS AND COMPLICATIONS  Generally, this is a safe procedure. However, as with any procedure, problems can occur. Possible problems include:  Bleeding.  Infection.  Leaking around the feeding tube.  Tearing through the jejunum (perforation).  Diarrhea.  Breathing the contents of your intestine into your lungs (aspiration pneumonia). BEFORE THE PROCEDURE  You may meet with a feeding tube and nutrition care provider to learn what to expect after surgery.  Quit smoking, if you smoke.  Ask your health care provider about changing or stopping your regular medicines.  Stop taking aspirin or blood-thinning medications 7-10 days before surgery if your health care provider instructs you to do so.  Do not eat or drink anything 6-8 hours before the procedure.  Arrange for someone to take you home after the procedure. PROCEDURE  This usually is an outpatient procedure. During the procedure:  You may get a medication through an IV tube to make you relaxed and drowsy (sedation).  You may be given a medicine that makes you go to sleep (general anesthetic). Or, you may get an injection to make only your stomach area numb (local anesthetic).  You  may get antibiotic medicine through your IV tube before the procedure starts.  The surgeon will pass a long, thin, flexible telescope (endoscope) through your mouth, past your stomach, and into your jejunum.  The surgeon will make a small cut (incision) through the skin on your belly and place the feeding tube through the opening.  The surgeon will use the endoscope to guide the feeding tube into the correct position inside the jejunum.  The feeding tube will be checked for leaking or bleeding.  The surgeon will put a bandage around the feeding tube. AFTER THE PROCEDURE You will remain in the recovery area until the anesthesia or sedation wears off and your surgeon says it is safe to go home. You may have some discomfort around the tube insertion site.   Your health care provider will give you instructions for recovery at home.  You will also receive supplies for feeding and tube care.   This information is not intended to replace advice given to you by your health care provider. Make sure you discuss any questions you have with your health care provider.   Document Released: 10/06/2013 Document Revised: 10/22/2014 Document Reviewed: 10/06/2013 Elsevier Interactive Patient Education [employer].   Please contact primary provider and inform them of today's visit and all relevant information. Please contact gastroenterology immediately for further evaluation and management.

## 2015-07-30 NOTE — ED Notes (Signed)
Called [facility] for transport.  

## 2015-07-30 NOTE — ED Notes (Signed)
Pt. Is from [facility] and pulled out G-tube. Pt. Is at baseline per EMS

## 2015-07-30 NOTE — ED Notes (Signed)
Pt returned from X-ray.  

## 2015-07-30 NOTE — ED Provider Notes (Signed)
CSN: [id number]     Arrival date & time 07/30/15  0515 History   First MD Initiated Contact with Patient 07/30/15 [phone]     Chief Complaint  Patient presents with  . Pulled out G-tube    HPI   Level 5 caveat due to MR  69 year old female with history of mental retardation, dementia, seizure disorder, here for evaluation of restless movements/agitation. Patient has been living at [facility] facility since July 2015, after hospitalization where she received a feeding tube. Patient received her G-tube on 04/05/2015 by interventional radiology. Patient presents today via EMS after nursing staff reports watching her pull out her G tube. They deny an abnormal behavior.    Past Medical History  Diagnosis Date  . Seizures ([place])   . Osteoporosis   . Thyroid disease     hypothyroid  . High cholesterol   . Cataracts, bilateral   . Optic atrophy   . Paraparesis (HCC)     mild  . Hypothyroidism   . Anginal pain ([place])   . Coronary artery disease    Past Surgical History  Procedure Laterality Date  . Midline incision    . Central venous catheter insertion  05/15/2014       . Esophagogastroduodenoscopy N/A 06/03/2014    Procedure: ESOPHAGOGASTRODUODENOSCOPY (EGD);  Surgeon: Beryle Beams, MD;  Location: [facility] ENDOSCOPY;  Service: Endoscopy;  Laterality: N/A;   Family History  Problem Relation Age of Onset  . Colon cancer Mother   . Diabetes type II Sister    Social History  Substance Use Topics  . Smoking status: Never Smoker   . Smokeless tobacco: Never Used  . Alcohol Use: No   OB History    Gravida Para Term Preterm AB TAB SAB Ectopic Multiple Living   0              Review of Systems  All other systems reviewed and are negative.   Allergies  Carvedilol and Ppd  Home Medications   Prior to Admission medications   Medication Sig Start Date End Date Taking? Authorizing Provider  bisacodyl (DULCOLAX) 10 MG suppository Place 10 mg rectally daily as needed for  moderate constipation.   Yes Historical Provider, MD  clonazePAM (KLONOPIN) 0.5 MG tablet Take 0.5 mg by mouth every 6 (six) hours as needed for anxiety.   Yes Historical Provider, MD  clonazePAM (KLONOPIN) 0.5 MG tablet Take one tablet via tube at bedtime scheduled for rest Patient taking differently: 0.5 mg by PEG Tube route at bedtime. Take one tablet via tube at bedtime scheduled for rest 06/27/15  Yes Tiffany L Reed, DO  docusate (COLACE) 50 MG/5ML liquid Place 20 mLs (200 mg total) into feeding tube 2 (two) times daily. 06/09/14  Yes Bonnielee Haff, MD  famotidine (PEPCID) 40 MG/5ML suspension Place 20 mg into feeding tube daily.   Yes Historical Provider, MD  furosemide (LASIX) 10 MG/ML solution Place 20 mg into feeding tube 2 (two) times daily.   Yes Historical Provider, MD  levETIRAcetam (KEPPRA) 100 MG/ML solution Place 5 mLs (500 mg total) into feeding tube daily at 6 (six) AM. 06/09/14  Yes Bonnielee Haff, MD  levETIRAcetam (KEPPRA) 100 MG/ML solution 750mg  every day at 1400hrs and 2000hrs Patient taking differently: Place 750 mg into feeding tube 2 (two) times daily. 1400 and 2200 06/09/14  Yes Bonnielee Haff, MD  levothyroxine (SYNTHROID, LEVOTHROID) 100 MCG tablet 100 mcg by PEG Tube route daily before breakfast.    Yes  Historical Provider, MD  magnesium hydroxide (MILK OF MAGNESIA) 400 MG/5ML suspension Place 30 mLs into feeding tube daily as needed for mild constipation.   Yes Historical Provider, MD  metoprolol tartrate (LOPRESSOR) 25 mg/10 mL SUSP Take 12.5 mg by mouth 3 (three) times daily.   Yes Historical Provider, MD  Multiple Vitamins-Minerals (CENTAMIN) LIQD Place 5 mLs into feeding tube daily.   Yes Historical Provider, MD  potassium chloride 20 MEQ/15ML (10%) SOLN 20 mEq by PEG Tube route daily.   Yes Historical Provider, MD  Valproic Acid (DEPAKENE) 250 MG/5ML SYRP syrup Place 15 mLs (750 mg total) into feeding tube 3 (three) times daily. 06/09/14  Yes Bonnielee Haff, MD   BP  111/72 mmHg  Pulse 96  Temp(Src) 98.2 F (36.8 C) (Oral)  Resp 12  Ht 5\' 2"  (1.575 m)  Wt 142 lb (64.411 kg)  BMI 25.97 kg/m2  SpO2 97%   Physical Exam  Constitutional: She is oriented to person, place, and time. She appears well-developed and well-nourished.  HENT:  Head: Normocephalic and atraumatic.  Eyes: Conjunctivae are normal. Pupils are equal, round, and reactive to light. Right eye exhibits no discharge. Left eye exhibits no discharge. No scleral icterus.  Neck: Normal range of motion. No JVD present. No tracheal deviation present.  Pulmonary/Chest: Effort normal. No stridor.  Abdominal: Soft. Bowel sounds are normal. She exhibits no distension and no mass. There is no tenderness. There is no rebound and no guarding.  Stoma site closed without signs of infection or bleeding  Neurological: She is alert and oriented to person, place, and time. Coordination normal.  Skin: Skin is warm and dry.  Psychiatric: She has a normal mood and affect. Her behavior is normal. Judgment and thought content normal.  Nursing note and vitals reviewed.     ED Course  Procedures (including critical care time) Labs Review Labs Reviewed - No data to display  Imaging Review No results found. I have personally reviewed and evaluated these images and lab results as part of my medical decision-making.   EKG Interpretation None      MDM   Final diagnoses:  Gastrojejunostomy tube dislodgement [facility])    Labs:   Imaging:   Consults:  Therapeutics:  Discharge Meds:   Assessment/Plan: Patient presents from nursing facility for G-tube placement. Nursing staff notes that she pulled out this morning, on exam it appears to be closed uncertain if time frame is accurate with removal of G-tube. Attempt at placing a 22 fr G tube into the stoma with no success, 16 Gage foley was attempted with success. Pt received abdominal radiographs as above. Pt will be discharged home with instructions to  follow-up with GI ASAP for further evaluation and management.        Okey Regal, PA-C 08/01/15 [place], MD 08/03/15 [phone]

## 2015-08-19 ENCOUNTER — Encounter: Payer: Self-pay | Admitting: Internal Medicine

## 2015-08-19 ENCOUNTER — Non-Acute Institutional Stay (SKILLED_NURSING_FACILITY): Payer: Medicare Other | Admitting: Internal Medicine

## 2015-08-19 DIAGNOSIS — Z931 Gastrostomy status: Secondary | ICD-10-CM | POA: Diagnosis not present

## 2015-08-19 DIAGNOSIS — R450 Nervousness: Secondary | ICD-10-CM | POA: Diagnosis not present

## 2015-08-19 DIAGNOSIS — F0390 Unspecified dementia without behavioral disturbance: Secondary | ICD-10-CM

## 2015-08-19 DIAGNOSIS — G2571 Drug induced akathisia: Secondary | ICD-10-CM

## 2015-08-19 NOTE — Assessment & Plan Note (Signed)
Has continued to improve with chronic clonopin use; Plan - conr clonopin

## 2015-08-19 NOTE — Progress Notes (Signed)
MRN: [id number] Name: Dawn [name]  Sex: female Age: 69 y.o. DOB: 07-29-1946  [place] #: [name] Facility/Room: Level Of Care: SNF Provider: Inocencio Homes D Emergency Contacts: Extended Emergency Contact Information Primary Emergency Contact: Sellars,[name] Address: [address]          [place] [address] [place] Phone: [phone] Relation: Sister Secondary Emergency Contact: Yatzari Cower [place] Phone: [phone] Relation: None  Code Status:   Allergies: Carvedilol and Ppd  Chief Complaint  Patient presents with  . Medical Management of Chronic Issues    HPI: Patient is 69 y.o. female with seizures, dementia HLD hypothyroidism, CAd who is being seen today for routine issues of restless movement disorder, dementia and PEG tube status.  Past Medical History  Diagnosis Date  . Seizures ([place])   . Osteoporosis   . Thyroid disease     hypothyroid  . High cholesterol   . Cataracts, bilateral   . Optic atrophy   . Paraparesis (HCC)     mild  . Hypothyroidism   . Anginal pain ([place])   . Coronary artery disease     Past Surgical History  Procedure Laterality Date  . Midline incision    . Central venous catheter insertion  05/15/2014       . Esophagogastroduodenoscopy N/A 06/03/2014    Procedure: ESOPHAGOGASTRODUODENOSCOPY (EGD);  Surgeon: Beryle Beams, MD;  Location: [facility] ENDOSCOPY;  Service: Endoscopy;  Laterality: N/A;      Medication List       This list is accurate as of: 08/19/15  9:22 PM.  Always use your most recent med list.               bisacodyl 10 MG suppository  Commonly known as:  DULCOLAX  Place 10 mg rectally daily as needed for moderate constipation.     CENTAMIN Liqd  Place 5 mLs into feeding tube daily.     clonazePAM 0.5 MG tablet  Commonly known as:  KLONOPIN  Take 0.5 mg by mouth every 6 (six) hours as needed for anxiety.     clonazePAM 0.5 MG tablet  Commonly known as:   KLONOPIN  Take one tablet via tube at bedtime scheduled for rest     docusate 50 MG/5ML liquid  Commonly known as:  COLACE  Place 20 mLs (200 mg total) into feeding tube 2 (two) times daily.     famotidine 40 MG/5ML suspension  Commonly known as:  PEPCID  Place 20 mg into feeding tube daily.     furosemide 10 MG/ML solution  Commonly known as:  LASIX  Place 20 mg into feeding tube 2 (two) times daily.     levETIRAcetam 100 MG/ML solution  Commonly known as:  KEPPRA  Place 5 mLs (500 mg total) into feeding tube daily at 6 (six) AM.     levETIRAcetam 100 MG/ML solution  Commonly known as:  KEPPRA  750mg  every day at 1400hrs and 2000hrs     levothyroxine 100 MCG tablet  Commonly known as:  SYNTHROID, LEVOTHROID  100 mcg by PEG Tube route daily before breakfast.     magnesium hydroxide 400 MG/5ML suspension  Commonly known as:  MILK OF MAGNESIA  Place 30 mLs into feeding tube daily as needed for mild constipation.     metoprolol tartrate 25 mg/10 mL Susp  Commonly known as:  LOPRESSOR  Take 12.5 mg by mouth 3 (three) times daily.     potassium chloride  20 MEQ/15ML (10%) Soln  20 mEq by PEG Tube route daily.     Valproic Acid 250 MG/5ML Syrp syrup  Commonly known as:  DEPAKENE  Place 15 mLs (750 mg total) into feeding tube 3 (three) times daily.        No orders of the defined types were placed in this encounter.    Immunization History  Administered Date(s) Administered  . Influenza-Unspecified 08/13/2014    Social History  Substance Use Topics  . Smoking status: Never Smoker   . Smokeless tobacco: Never Used  . Alcohol Use: No    Review of Systems  UTO from pt-vegetative; nursing without concerns    Filed Vitals:   08/19/15 2112  BP: 90/57  Pulse: 72  Temp: 98 F (36.7 C)  Resp: 19    Physical Exam  GENERAL APPEARANCE: eyes open, No acute distress  SKIN: No diaphoresis rash HEENT: Unremarkable RESPIRATORY: Breathing is even, unlabored. Lung  sounds are clear   CARDIOVASCULAR: Heart RRR no murmurs, rubs or gallops. No peripheral edema  GASTROINTESTINAL: Abdomen is soft, non-tender, not distended w/ normal bowel sounds; PEG  GENITOURINARY: Bladder non tender, not distended  MUSCULOSKELETAL: wasting and contractures NEUROLOGIC: vegetative , quadriparesis, no abnormal movements today PSYCHIATRIC: n/a  Patient Active Problem List   Diagnosis Date Noted  . Severe mental retardation 01/10/2015  . Severe dementia 01/10/2015  . Senile debility 01/10/2015  . 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 Acute respiratory failure with hypoxia ([place]) 11/20/2014  . Generalized weakness 11/20/2014  . Dysphagia 11/20/2014  . Dementia without behavioral disturbance 11/20/2014  . Development delay 11/02/2014  . Acute respiratory distress (HCC) 11/02/2014  . Paroxysmal ventricular tachycardia ([place]) 10/30/2014  . HCAP (healthcare-associated pneumonia) 10/26/2014  . Hematoma of right parietal scalp 08/13/2014  . Edema 08/09/2014  . Hematoma 08/05/2014  . Fall at nursing home 08/05/2014  . Leukocytosis 07/26/2014  . Tachycardia 07/11/2014  . Leukocytopenia, unspecified 07/11/2014  . Pneumonia 06/17/2014  . Sepsis ([place]) 06/12/2014  . Facial droop 06/12/2014  . Hypernatremia 06/12/2014  . PEG (percutaneous endoscopic gastrostomy) status ([place]) 06/12/2014  . FUO (fever of unknown origin) 06/02/2014  . Paroxysmal a-fib ([place]) 05/14/2014  . Dysphasia 05/14/2014  . UTI (lower urinary tract infection) 05/14/2014  . Tooth decay 05/14/2014  . SIRS (systemic inflammatory response syndrome) ([place]) 12/15/2013  . Thrombocytopenia ([place]) 10/29/2013  . Hypotension 10/29/2013  . Anemia of chronic disease 10/29/2013  . Diastolic heart failure, NYHA class 2 ([place]) 10/29/2013  . Atrial fibrillation with rapid ventricular response ([place]) 10/28/2013  . Seizure disorder ([place]) 10/28/2013  . Hypothyroidism 10/28/2013  . HLD (hyperlipidemia) 10/28/2013  . Non-ST  elevation myocardial infarction (NSTEMI) ([place]) 10/28/2013    CBC    Component Value Date/Time   WBC 12.7* 11/12/2014 0614   WBC 20.8 10/19/2014   RBC 3.17* 11/12/2014 0614   RBC 1.74* 05/15/2014 0740   HGB 9.5* 11/12/2014 0614   HCT 29.6* 11/12/2014 0614   HCT 25.4* 11/03/2014 0725   PLT 449* 11/12/2014 0614   MCV 93.4 11/12/2014 0614   LYMPHSABS 2.7 11/12/2014 0614   MONOABS 1.7* 11/12/2014 0614   EOSABS 0.0 11/12/2014 0614   BASOSABS 0.0 11/12/2014 0614    CMP     Component Value Date/Time   NA 134* 11/12/2014 0614   NA 138 10/19/2014   K 4.4 11/12/2014 0614   CL 97 11/12/2014 0614   CO2 32 11/12/2014 0614   GLUCOSE 124* 11/12/2014 0614   BUN 9 11/12/2014 4098  BUN 26* 10/19/2014   CREATININE 0.54 11/12/2014 0614   CREATININE 0.4* 07/27/2014   CALCIUM 8.8 11/12/2014 0614   PROT 7.3 11/08/2014 0600   ALBUMIN 1.8* 11/08/2014 0600   AST 21 11/08/2014 0600   ALT 8 11/08/2014 0600   ALKPHOS 86 11/08/2014 0600   BILITOT 0.4 11/08/2014 0600   GFRNONAA >90 11/12/2014 0614   GFRAA >90 11/12/2014 0614    Assessment and Plan  Restless movement disorder Has continued to improve with chronic clonopin use; Plan - conr clonopin  Dementia without behavioral disturbance MR and dementia; pt  vegetative at baseline; Plan - cont to monitor  PEG (percutaneous endoscopic gastrostomy) status Pt had her PEG tube replaced last month; amzingly has not had aspirations issues; Plan - cont to monitor    Hennie Duos, MD

## 2015-08-19 NOTE — Assessment & Plan Note (Signed)
MR and dementia; pt  vegetative at baseline; Plan - cont to monitor

## 2015-08-19 NOTE — Assessment & Plan Note (Signed)
Pt had her PEG tube replaced last month; amzingly has not had aspirations issues; Plan - cont to monitor

## 2015-09-05 ENCOUNTER — Encounter (HOSPITAL_COMMUNITY): Payer: Self-pay | Admitting: Emergency Medicine

## 2015-09-05 ENCOUNTER — Emergency Department (HOSPITAL_COMMUNITY): Payer: Medicare Other

## 2015-09-05 ENCOUNTER — Emergency Department (HOSPITAL_COMMUNITY)
Admission: EM | Admit: 2015-09-05 | Discharge: 2015-09-05 | Disposition: A | Discharge status: Home / Self Care | Payer: Medicare Other | Attending: Physician Assistant | Admitting: Physician Assistant

## 2015-09-05 DIAGNOSIS — H269 Unspecified cataract: Secondary | ICD-10-CM | POA: Insufficient documentation

## 2015-09-05 DIAGNOSIS — Z0189 Encounter for other specified special examinations: Secondary | ICD-10-CM

## 2015-09-05 DIAGNOSIS — Z4659 Encounter for fitting and adjustment of other gastrointestinal appliance and device: Secondary | ICD-10-CM | POA: Insufficient documentation

## 2015-09-05 DIAGNOSIS — K219 Gastro-esophageal reflux disease without esophagitis: Secondary | ICD-10-CM | POA: Diagnosis not present

## 2015-09-05 DIAGNOSIS — I509 Heart failure, unspecified: Secondary | ICD-10-CM | POA: Diagnosis not present

## 2015-09-05 DIAGNOSIS — Z8739 Personal history of other diseases of the musculoskeletal system and connective tissue: Secondary | ICD-10-CM | POA: Insufficient documentation

## 2015-09-05 DIAGNOSIS — F0390 Unspecified dementia without behavioral disturbance: Secondary | ICD-10-CM | POA: Insufficient documentation

## 2015-09-05 DIAGNOSIS — Z862 Personal history of diseases of the blood and blood-forming organs and certain disorders involving the immune mechanism: Secondary | ICD-10-CM | POA: Diagnosis not present

## 2015-09-05 DIAGNOSIS — E039 Hypothyroidism, unspecified: Secondary | ICD-10-CM | POA: Insufficient documentation

## 2015-09-05 DIAGNOSIS — Z79899 Other long term (current) drug therapy: Secondary | ICD-10-CM | POA: Diagnosis not present

## 2015-09-05 DIAGNOSIS — I25119 Atherosclerotic heart disease of native coronary artery with unspecified angina pectoris: Secondary | ICD-10-CM | POA: Insufficient documentation

## 2015-09-05 HISTORY — DX: Dental caries, unspecified: K02.9

## 2015-09-05 HISTORY — DX: Unspecified optic atrophy: H47.20

## 2015-09-05 HISTORY — DX: Gastro-esophageal reflux disease without esophagitis: K21.9

## 2015-09-05 HISTORY — DX: Unspecified dementia, unspecified severity, without behavioral disturbance, psychotic disturbance, mood disturbance, and anxiety: F03.90

## 2015-09-05 HISTORY — DX: Hyperlipidemia, unspecified: E78.5

## 2015-09-05 HISTORY — DX: Heart failure, unspecified: I50.9

## 2015-09-05 HISTORY — DX: Dysphagia, unspecified: R13.10

## 2015-09-05 HISTORY — DX: Unspecified cataract: H26.9

## 2015-09-05 HISTORY — DX: Anemia, unspecified: D64.9

## 2015-09-05 HISTORY — DX: Unspecified convulsions: R56.9

## 2015-09-05 HISTORY — DX: Hypotension, unspecified: I95.9

## 2015-09-05 HISTORY — DX: Paroxysmal atrial fibrillation: I48.0

## 2015-09-05 MED ORDER — DIATRIZOATE MEGLUMINE & SODIUM 66-10 % PO SOLN
20.0000 mL | Freq: Once | ORAL | Status: AC
  Administered 2015-09-05: 20 mL

## 2015-09-05 NOTE — ED Notes (Signed)
Per [facility], pt arrives with c/o green discharge from feeding tube.Comes from [place], ems reports lowest bp 90/60, temp 91, pulse 82, rr 18, pO2 97 Rm air. Hx of dementia, alert per normal state reported by caregiving staff  Via ems report. Pt is paraplegic and aphasia.

## 2015-09-05 NOTE — ED Notes (Signed)
Bed: [id number] Expected date:  Expected time:  Means of arrival:  Comments: Feeding tube draining green fluid

## 2015-09-05 NOTE — ED Provider Notes (Signed)
CSN: [id number]     Arrival date & time 09/05/15  [id number] History   First MD Initiated Contact with Patient 09/05/15 [phone]     Chief Complaint  Patient presents with  . G-tube Issue      (Consider location/radiation/quality/duration/timing/severity/associated sxs/prior Treatment) HPI  Patient is a 69 year old n nonverbal G-tube dependent vegetative state female presenting with green discharge from her G-tube. I called [facility]. Apparently the night nurse felt that the G-tube was too far into her stomach. And then noted green  discharge. It has been working appropriately.  The day nurse said that the external tube appeared shorter than he usually did. I had long discussion about G-tube length and how it will very if you push in or pull out.  This patient'sg tube was replaced earlier this month.  Past Medical History  Diagnosis Date  . Seizures ([place])   . Osteoporosis   . Thyroid disease     hypothyroid  . High cholesterol   . Cataracts, bilateral   . Optic atrophy   . Paraparesis (HCC)     mild  . Hypothyroidism   . Anginal pain ([place])   . Coronary artery disease   . CHF (congestive heart failure) ([place])   . Paroxysmal atrial fibrillation (HCC)   . Dysphagia   . Unspecified convulsions ([place])   . Dementia   . Optic atrophy   . Hypotension   . Anemia   . Hyperlipidemia   . Cataract   . Dental caries   . Esophageal reflux    Past Surgical History  Procedure Laterality Date  . Midline incision    . Central venous catheter insertion  05/15/2014       . Esophagogastroduodenoscopy N/A 06/03/2014    Procedure: ESOPHAGOGASTRODUODENOSCOPY (EGD);  Surgeon: Beryle Beams, MD;  Location: [facility] ENDOSCOPY;  Service: Endoscopy;  Laterality: N/A;   Family History  Problem Relation Age of Onset  . Colon cancer Mother   . Diabetes type II Sister    Social History  Substance Use Topics  . Smoking status: Never Smoker   . Smokeless tobacco: Never Used  .  Alcohol Use: No   OB History    Gravida Para Term Preterm AB TAB SAB Ectopic Multiple Living   0              Review of Systems  Unable to perform ROS: Patient nonverbal      Allergies  Carvedilol and Ppd  Home Medications   Prior to Admission medications   Medication Sig Start Date End Date Taking? Authorizing Provider  bisacodyl (DULCOLAX) 10 MG suppository Place 10 mg rectally daily as needed for moderate constipation.    Historical Provider, MD  clonazePAM (KLONOPIN) 0.5 MG tablet Take 0.5 mg by mouth every 6 (six) hours as needed for anxiety.    Historical Provider, MD  clonazePAM (KLONOPIN) 0.5 MG tablet Take one tablet via tube at bedtime scheduled for rest Patient taking differently: 0.5 mg by PEG Tube route at bedtime. Take one tablet via tube at bedtime scheduled for rest 06/27/15   Tiffany L Reed, DO  docusate (COLACE) 50 MG/5ML liquid Place 20 mLs (200 mg total) into feeding tube 2 (two) times daily. 06/09/14   Bonnielee Haff, MD  famotidine (PEPCID) 40 MG/5ML suspension Place 20 mg into feeding tube daily.    Historical Provider, MD  furosemide (LASIX) 10 MG/ML solution Place 20 mg into feeding tube 2 (two) times daily.    Historical  Provider, MD  levETIRAcetam (KEPPRA) 100 MG/ML solution Place 5 mLs (500 mg total) into feeding tube daily at 6 (six) AM. 06/09/14   Bonnielee Haff, MD  levETIRAcetam (KEPPRA) 100 MG/ML solution 750mg  every day at 1400hrs and 2000hrs Patient taking differently: Place 750 mg into feeding tube 2 (two) times daily. 1400 and 2200 06/09/14   Bonnielee Haff, MD  levothyroxine (SYNTHROID, LEVOTHROID) 100 MCG tablet 100 mcg by PEG Tube route daily before breakfast.     Historical Provider, MD  magnesium hydroxide (MILK OF MAGNESIA) 400 MG/5ML suspension Place 30 mLs into feeding tube daily as needed for mild constipation.    Historical Provider, MD  metoprolol tartrate (LOPRESSOR) 25 mg/10 mL SUSP Take 12.5 mg by mouth 3 (three) times daily.     Historical Provider, MD  Multiple Vitamins-Minerals (CENTAMIN) LIQD Place 5 mLs into feeding tube daily.    Historical Provider, MD  potassium chloride 20 MEQ/15ML (10%) SOLN 20 mEq by PEG Tube route daily.    Historical Provider, MD  Valproic Acid (DEPAKENE) 250 MG/5ML SYRP syrup Place 15 mLs (750 mg total) into feeding tube 3 (three) times daily. 06/09/14   Bonnielee Haff, MD   BP 107/79 mmHg  Pulse 83  Temp(Src) 97.4 F (36.3 C) (Oral)  Resp 16  SpO2 100% Physical Exam  Constitutional: She appears well-developed.  HENT:  Head: Normocephalic and atraumatic.  Eyes: Conjunctivae are normal. Right eye exhibits no discharge.  Neck: Neck supple.  Cardiovascular: Normal rate and regular rhythm.   No murmur heard. Pulmonary/Chest: Effort normal and breath sounds normal. She has no wheezes. She has no rales.  Abdominal: Soft. She exhibits no distension. There is no tenderness.  G-tube in place with no surrounding erythema.  Musculoskeletal: Normal range of motion. She exhibits no edema.  Neurological:  Patient with spasticity curled into a ball. Nonverbal at baseline.  Skin: Skin is warm and dry. No rash noted. She is not diaphoretic.  Nursing note and vitals reviewed.   ED Course  Procedures (including critical care time) Labs Review Labs Reviewed - No data to display  Imaging Review Dg Abd 1 View  09/05/2015  CLINICAL DATA:  Feeding tube placement EXAM: ABDOMEN - 1 VIEW COMPARISON:  07/30/2015 FINDINGS: Feeding tube is in place. Contrast was injected showing at the tip of the feeding tube is in the proximal jejunum. No evidence of contrast extravasation or bowel obstruction. Prior cholecystectomy. IMPRESSION: Feeding tube tip in the proximal jejunum. Electronically Signed   By: Rolm Baptise M.D.   On: 09/05/2015 10:04   I have personally reviewed and evaluated these images and lab results as part of my medical decision-making.   EKG Interpretation None      MDM   Final  diagnoses:  None    Patient is 69 year old nonverbal female presenting with green discharge from her  her G-tube. We will confirm G-tube location Gastrografin and then plan to discharge back. Has been functioning normally. The nurses were just concerned because the external tube was shorter (likely it was just it was pushed into her abdomen)   GJ tube in place per x-ray. We'll plan to discharge back home. Normal vital signs in the emergency department.     Kordae Buonocore Julio Alm, MD 09/05/15 1019

## 2015-09-05 NOTE — Discharge Instructions (Signed)
Please keep dressing on GJ tube site. It is confirmed to be in the right location.

## 2015-09-12 ENCOUNTER — Other Ambulatory Visit (HOSPITAL_COMMUNITY): Payer: Self-pay | Admitting: Interventional Radiology

## 2015-09-12 DIAGNOSIS — R1310 Dysphagia, unspecified: Secondary | ICD-10-CM

## 2015-09-13 ENCOUNTER — Ambulatory Visit (HOSPITAL_COMMUNITY)
Admission: RE | Admit: 2015-09-13 | Discharge: 2015-09-13 | Disposition: A | Discharge status: Home / Self Care | Payer: Medicare Other | Source: Ambulatory Visit | Attending: Interventional Radiology | Admitting: Interventional Radiology

## 2015-09-13 DIAGNOSIS — R1310 Dysphagia, unspecified: Secondary | ICD-10-CM

## 2015-09-13 DIAGNOSIS — Z431 Encounter for attention to gastrostomy: Secondary | ICD-10-CM | POA: Insufficient documentation

## 2015-09-13 MED ORDER — IOHEXOL 300 MG/ML  SOLN
50.0000 mL | Freq: Once | INTRAMUSCULAR | Status: AC | PRN
  Administered 2015-09-13: 10 mL

## 2015-09-13 MED ORDER — LIDOCAINE VISCOUS 2 % MT SOLN
OROMUCOSAL | Status: AC
  Filled 2015-09-13: qty 15

## 2015-09-21 ENCOUNTER — Non-Acute Institutional Stay (SKILLED_NURSING_FACILITY): Payer: Medicare Other | Admitting: Internal Medicine

## 2015-09-21 ENCOUNTER — Encounter: Payer: Self-pay | Admitting: Internal Medicine

## 2015-09-21 DIAGNOSIS — I480 Paroxysmal atrial fibrillation: Secondary | ICD-10-CM

## 2015-09-21 DIAGNOSIS — I5030 Unspecified diastolic (congestive) heart failure: Secondary | ICD-10-CM

## 2015-09-21 DIAGNOSIS — G40909 Epilepsy, unspecified, not intractable, without status epilepticus: Secondary | ICD-10-CM

## 2015-09-21 NOTE — Assessment & Plan Note (Signed)
No known seizures; plan - continue Keppra BID

## 2015-09-21 NOTE — Assessment & Plan Note (Signed)
No episodes of A fib;plan - cont metoprolol 12.5 mg TID thru PEG, no prophylaxis

## 2015-09-21 NOTE — Assessment & Plan Note (Signed)
No known exacerbation , well controlled on metoprolol 12.5 mg TID and lasix 20 mg daily

## 2015-09-21 NOTE — Progress Notes (Signed)
MRN: [id number] Name: Dawn [name]  Sex: female Age: 69 y.o. DOB: January 09, 1946  [place] [facility]: [name] Facility/Room: Level Of Care: SNF Provider: Inocencio Homes D Emergency Contacts: Extended Emergency Contact Information Primary Emergency Contact: Sellars,[name] Address: [address]          [place] [address] [place] Phone: [phone] Relation: Sister Secondary Emergency Contact: Travia Cower [place] Phone: [phone] Relation: None  Code Status:   Allergies: Carvedilol and Ppd  Chief Complaint  Patient presents with  . Medical Management of Chronic Issues    HPI: Patient is 69 y.o.vegetative female with seizures, dementia HLD hypothyroidism, CAd who is being seen today for routine issues of  PAF,CHF and seizures.  Past Medical History  Diagnosis Date  . Seizures ([place])   . Osteoporosis   . Thyroid disease     hypothyroid  . High cholesterol   . Cataracts, bilateral   . Optic atrophy   . Paraparesis (HCC)     mild  . Hypothyroidism   . Anginal pain ([place])   . Coronary artery disease   . CHF (congestive heart failure) ([facility])   . Paroxysmal atrial fibrillation (HCC)   . Dysphagia   . Unspecified convulsions ([place])   . Dementia   . Optic atrophy   . Hypotension   . Anemia   . Hyperlipidemia   . Cataract   . Dental caries   . Esophageal reflux     Past Surgical History  Procedure Laterality Date  . Midline incision    . Central venous catheter insertion  05/15/2014       . Esophagogastroduodenoscopy N/A 06/03/2014    Procedure: ESOPHAGOGASTRODUODENOSCOPY (EGD);  Surgeon: Beryle Beams, MD;  Location: [facility] ENDOSCOPY;  Service: Endoscopy;  Laterality: N/A;      Medication List       This list is accurate as of: 09/21/15  8:58 PM.  Always use your most recent med list.               bisacodyl 10 MG suppository  Commonly known as:  DULCOLAX  Place 10 mg rectally daily as needed for moderate  constipation.     CENTAMIN Liqd  Place 5 mLs into feeding tube daily.     clonazePAM 0.5 MG tablet  Commonly known as:  KLONOPIN  Take 0.5 mg by mouth every 6 (six) hours as needed for anxiety.     clonazePAM 0.5 MG tablet  Commonly known as:  KLONOPIN  Take one tablet via tube at bedtime scheduled for rest     docusate 50 MG/5ML liquid  Commonly known as:  COLACE  Place 20 mLs (200 mg total) into feeding tube 2 (two) times daily.     famotidine 40 MG/5ML suspension  Commonly known as:  PEPCID  Place 20 mg into feeding tube daily.     furosemide 10 MG/ML solution  Commonly known as:  LASIX  Place 20 mg into feeding tube 2 (two) times daily.     levETIRAcetam 100 MG/ML solution  Commonly known as:  KEPPRA  Place 5 mLs (500 mg total) into feeding tube daily at 6 (six) AM.     levETIRAcetam 100 MG/ML solution  Commonly known as:  KEPPRA  750mg  every day at 1400hrs and 2000hrs     levothyroxine 100 MCG tablet  Commonly known as:  SYNTHROID, LEVOTHROID  100 mcg by PEG Tube route daily before breakfast.     magnesium hydroxide  400 MG/5ML suspension  Commonly known as:  MILK OF MAGNESIA  Place 30 mLs into feeding tube daily as needed for mild constipation.     metoprolol tartrate 25 mg/10 mL Susp  Commonly known as:  LOPRESSOR  Take 12.5 mg by mouth 3 (three) times daily.     potassium chloride 20 MEQ/15ML (10%) Soln  20 mEq by PEG Tube route daily.     Valproic Acid 250 MG/5ML Syrp syrup  Commonly known as:  DEPAKENE  Place 15 mLs (750 mg total) into feeding tube 3 (three) times daily.        No orders of the defined types were placed in this encounter.    Immunization History  Administered Date(s) Administered  . Influenza-Unspecified 08/13/2014    Social History  Substance Use Topics  . Smoking status: Never Smoker   . Smokeless tobacco: Never Used  . Alcohol Use: No    Review of Systems UTO 2/2 vegetative state;nursing without concerns    Filed  Vitals:   09/21/15 1454  BP: 90/57  Pulse: 74  Temp: 97 F (36.1 C)  Resp: 18    Physical Exam  GENERAL APPEARANCE: eyes open No acute distress  SKIN: No diaphoresis rash HEENT: Unremarkable RESPIRATORY: Breathing is even, unlabored. Lung sounds are clear   CARDIOVASCULAR: Heart RRR no murmurs, rubs or gallops. No peripheral edema  GASTROINTESTINAL: Abdomen is soft, non-tender, not distended w/ normal bowel soundsPEG.  GENITOURINARY: Bladder non tender, not distended  MUSCULOSKELETAL: wasting,contracures all ext NEUROLOGIC: Cranial nerves 2-12 grossly intact; paresis all extremitis PSYCHIATRIC: vegetative, no behavioral issues  Patient Active Problem List   Diagnosis Date Noted  . Severe mental retardation 01/10/2015  . Severe dementia 01/10/2015  . Senile debility 01/10/2015  . 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 Acute respiratory failure with hypoxia ([place]) 11/20/2014  . Generalized weakness 11/20/2014  . Dysphagia 11/20/2014  . Dementia without behavioral disturbance 11/20/2014  . Development delay 11/02/2014  . Acute respiratory distress (HCC) 11/02/2014  . Paroxysmal ventricular tachycardia ([place]) 10/30/2014  . HCAP (healthcare-associated pneumonia) 10/26/2014  . Hematoma of right parietal scalp 08/13/2014  . Edema 08/09/2014  . Hematoma 08/05/2014  . Fall at nursing home 08/05/2014  . Leukocytosis 07/26/2014  . Tachycardia 07/11/2014  . Leukocytopenia, unspecified 07/11/2014  . Pneumonia 06/17/2014  . Sepsis ([name]) 06/12/2014  . Facial droop 06/12/2014  . Hypernatremia 06/12/2014  . PEG (percutaneous endoscopic gastrostomy) status ([place]) 06/12/2014  . FUO (fever of unknown origin) 06/02/2014  . Paroxysmal a-fib ([place]) 05/14/2014  . Dysphasia 05/14/2014  . UTI (lower urinary tract infection) 05/14/2014  . Tooth decay 05/14/2014  . SIRS (systemic inflammatory response syndrome) ([place]) 12/15/2013  . Thrombocytopenia ([place]) 10/29/2013  . Hypotension 10/29/2013   . Anemia of chronic disease 10/29/2013  . Diastolic heart failure, NYHA class 2 ([place]) 10/29/2013  . Atrial fibrillation with rapid ventricular response ([place]) 10/28/2013  . Seizure disorder ([place]) 10/28/2013  . Hypothyroidism 10/28/2013  . HLD (hyperlipidemia) 10/28/2013  . Non-ST elevation myocardial infarction (NSTEMI) ([place]) 10/28/2013    CBC    Component Value Date/Time   WBC 12.7* 11/12/2014 0614   WBC 20.8 10/19/2014   RBC 3.17* 11/12/2014 0614   RBC 1.74* 05/15/2014 0740   HGB 9.5* 11/12/2014 0614   HCT 29.6* 11/12/2014 0614   HCT 25.4* 11/03/2014 0725   PLT 449* 11/12/2014 0614   MCV 93.4 11/12/2014 0614   LYMPHSABS 2.7 11/12/2014 0614   MONOABS 1.7* 11/12/2014 0614   EOSABS 0.0 11/12/2014 [id number]  BASOSABS 0.0 11/12/2014 0614    CMP     Component Value Date/Time   NA 134* 11/12/2014 0614   NA 138 10/19/2014   K 4.4 11/12/2014 0614   CL 97 11/12/2014 0614   CO2 32 11/12/2014 0614   GLUCOSE 124* 11/12/2014 0614   BUN 9 11/12/2014 0614   BUN 26* 10/19/2014   CREATININE 0.54 11/12/2014 0614   CREATININE 0.4* 07/27/2014   CALCIUM 8.8 11/12/2014 0614   PROT 7.3 11/08/2014 0600   ALBUMIN 1.8* 11/08/2014 0600   AST 21 11/08/2014 0600   ALT 8 11/08/2014 0600   ALKPHOS 86 11/08/2014 0600   BILITOT 0.4 11/08/2014 0600   GFRNONAA >90 11/12/2014 0614   GFRAA >90 11/12/2014 0614    Assessment and Plan  Paroxysmal a-fib No episodes of A fib;plan - cont metoprolol 12.5 mg TID thru PEG, no prophylaxis  Diastolic heart failure, NYHA class 2 No known exacerbation , well controlled on metoprolol 12.5 mg TID and lasix 20 mg daily  Seizure disorder No known seizures; plan - continue Keppra BID    Hennie Duos, MD

## 2015-10-19 ENCOUNTER — Non-Acute Institutional Stay (SKILLED_NURSING_FACILITY): Payer: Medicare Other | Admitting: Internal Medicine

## 2015-10-19 ENCOUNTER — Encounter: Payer: Self-pay | Admitting: Internal Medicine

## 2015-10-19 DIAGNOSIS — R1310 Dysphagia, unspecified: Secondary | ICD-10-CM

## 2015-10-19 DIAGNOSIS — F72 Severe intellectual disabilities: Secondary | ICD-10-CM | POA: Diagnosis not present

## 2015-10-19 DIAGNOSIS — E034 Atrophy of thyroid (acquired): Secondary | ICD-10-CM

## 2015-10-19 DIAGNOSIS — E038 Other specified hypothyroidism: Secondary | ICD-10-CM

## 2015-10-19 NOTE — Progress Notes (Signed)
MRN: [id number] Name: Dawn [name]  Sex: female Age: 70 y.o. DOB: 1945-12-06  [place] [facility]: [name] Facility/Room:205 Level Of Care: SNF Provider: Inocencio Homes D Emergency Contacts: Extended Emergency Contact Information Primary Emergency Contact: Sellars,[name] Address: [place]          [place] [address] [place] Phone: [phone] Relation: Sister Secondary Emergency Contact: Mahogany Cower [place] Phone: [phone] Relation: None  Code Status:   Allergies: Carvedilol and Ppd  Chief Complaint  Patient presents with  . Medical Management of Chronic Issues    HPI: Patient is 70 y.o. female vegetative female with seizures, dementia HLD hypothyroidism, CAd who is being seen today for routine issues ofhypothyroidism, dysphagia and MR.  Past Medical History  Diagnosis Date  . Seizures ([place])   . Osteoporosis   . Thyroid disease     hypothyroid  . High cholesterol   . Cataracts, bilateral   . Optic atrophy   . Paraparesis (HCC)     mild  . Hypothyroidism   . Anginal pain ([place])   . Coronary artery disease   . CHF (congestive heart failure) ([place])   . Paroxysmal atrial fibrillation (HCC)   . Dysphagia   . Unspecified convulsions ([place])   . Dementia   . Optic atrophy   . Hypotension   . Anemia   . Hyperlipidemia   . Cataract   . Dental caries   . Esophageal reflux     Past Surgical History  Procedure Laterality Date  . Midline incision    . Central venous catheter insertion  05/15/2014       . Esophagogastroduodenoscopy N/A 06/03/2014    Procedure: ESOPHAGOGASTRODUODENOSCOPY (EGD);  Surgeon: Beryle Beams, MD;  Location: [facility] ENDOSCOPY;  Service: Endoscopy;  Laterality: N/A;      Medication List       This list is accurate as of: 10/19/15 11:59 PM.  Always use your most recent med list.               bisacodyl 10 MG suppository  Commonly known as:  DULCOLAX  Place 10 mg rectally daily as needed  for moderate constipation.     CENTAMIN Liqd  Place 5 mLs into feeding tube daily.     clonazePAM 0.5 MG tablet  Commonly known as:  KLONOPIN  Take 0.5 mg by mouth every 6 (six) hours as needed for anxiety.     clonazePAM 0.5 MG tablet  Commonly known as:  KLONOPIN  Take one tablet via tube at bedtime scheduled for rest     docusate 50 MG/5ML liquid  Commonly known as:  COLACE  Place 20 mLs (200 mg total) into feeding tube 2 (two) times daily.     famotidine 40 MG/5ML suspension  Commonly known as:  PEPCID  Place 20 mg into feeding tube daily.     furosemide 10 MG/ML solution  Commonly known as:  LASIX  Place 20 mg into feeding tube 2 (two) times daily.     levETIRAcetam 100 MG/ML solution  Commonly known as:  KEPPRA  Place 5 mLs (500 mg total) into feeding tube daily at 6 (six) AM.     levETIRAcetam 100 MG/ML solution  Commonly known as:  KEPPRA  750mg  every day at 1400hrs and 2000hrs     levothyroxine 100 MCG tablet  Commonly known as:  SYNTHROID, LEVOTHROID  100 mcg by PEG Tube route daily before breakfast.     magnesium hydroxide  400 MG/5ML suspension  Commonly known as:  MILK OF MAGNESIA  Place 30 mLs into feeding tube daily as needed for mild constipation.     metoprolol tartrate 25 mg/10 mL Susp  Commonly known as:  LOPRESSOR  Take 12.5 mg by mouth 3 (three) times daily.     potassium chloride 20 MEQ/15ML (10%) Soln  20 mEq by PEG Tube route daily.     Valproic Acid 250 MG/5ML Syrp syrup  Commonly known as:  DEPAKENE  Place 15 mLs (750 mg total) into feeding tube 3 (three) times daily.        No orders of the defined types were placed in this encounter.    Immunization History  Administered Date(s) Administered  . Influenza-Unspecified 08/13/2014    Social History  Substance Use Topics  . Smoking status: Never Smoker   . Smokeless tobacco: Never Used  . Alcohol Use: No    Review of Systems  UTO 2/2 vegetative state   Filed Vitals:    10/19/15 1104  BP: 90/57  Pulse: 92  Temp: 97 F (36.1 C)  Resp: 19    Physical Exam  GENERAL APPEARANCE: eyes open, No acute distress  SKIN: No diaphoresis rash HEENT: Unremarkable RESPIRATORY: Breathing is even, unlabored. Lung sounds are clear   CARDIOVASCULAR: Heart RRR no murmurs, rubs or gallops. No peripheral edema  GASTROINTESTINAL: Abdomen is soft, non-tender, not distended w/ normal bowel sounds.  GENITOURINARY: Bladder non tender, not distended  MUSCULOSKELETAL: wasting and contractures all extremities NEUROLOGIC: Cranial nerves 2-12 grossly intact; quadriparesis PSYCHIATRIC: vegetative  Patient Active Problem List   Diagnosis Date Noted  . Severe mental retardation 01/10/2015  . Severe dementia 01/10/2015  . Senile debility 01/10/2015  . 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 Acute respiratory failure with hypoxia ([place]) 11/20/2014  . Generalized weakness 11/20/2014  . Dysphagia 11/20/2014  . Dementia without behavioral disturbance 11/20/2014  . Development delay 11/02/2014  . Acute respiratory distress (HCC) 11/02/2014  . Paroxysmal ventricular tachycardia ([place]) 10/30/2014  . HCAP (healthcare-associated pneumonia) 10/26/2014  . Hematoma of right parietal scalp 08/13/2014  . Edema 08/09/2014  . Hematoma 08/05/2014  . Fall at nursing home 08/05/2014  . Leukocytosis 07/26/2014  . Tachycardia 07/11/2014  . Leukocytopenia, unspecified 07/11/2014  . Pneumonia 06/17/2014  . Sepsis ([place]) 06/12/2014  . Facial droop 06/12/2014  . Hypernatremia 06/12/2014  . PEG (percutaneous endoscopic gastrostomy) status ([place]) 06/12/2014  . FUO (fever of unknown origin) 06/02/2014  . Paroxysmal a-fib ([place]) 05/14/2014  . Dysphasia 05/14/2014  . UTI (lower urinary tract infection) 05/14/2014  . Tooth decay 05/14/2014  . SIRS (systemic inflammatory response syndrome) ([place]) 12/15/2013  . Thrombocytopenia ([place]) 10/29/2013  . Hypotension 10/29/2013  . Anemia of chronic disease  10/29/2013  . Diastolic heart failure, NYHA class 2 ([place]) 10/29/2013  . Atrial fibrillation with rapid ventricular response ([place]) 10/28/2013  . Seizure disorder ([place]) 10/28/2013  . Hypothyroidism 10/28/2013  . HLD (hyperlipidemia) 10/28/2013  . Non-ST elevation myocardial infarction (NSTEMI) ([place]) 10/28/2013    CBC    Component Value Date/Time   WBC 12.7* 11/12/2014 0614   WBC 20.8 10/19/2014   RBC 3.17* 11/12/2014 0614   RBC 1.74* 05/15/2014 0740   HGB 9.5* 11/12/2014 0614   HCT 29.6* 11/12/2014 0614   HCT 25.4* 11/03/2014 0725   PLT 449* 11/12/2014 0614   MCV 93.4 11/12/2014 0614   LYMPHSABS 2.7 11/12/2014 0614   MONOABS 1.7* 11/12/2014 0614   EOSABS 0.0 11/12/2014 0614   BASOSABS 0.0 11/12/2014  [id number]    CMP     Component Value Date/Time   NA 134* 11/12/2014 0614   NA 138 10/19/2014   K 4.4 11/12/2014 0614   CL 97 11/12/2014 0614   CO2 32 11/12/2014 0614   GLUCOSE 124* 11/12/2014 0614   BUN 9 11/12/2014 0614   BUN 26* 10/19/2014   CREATININE 0.54 11/12/2014 0614   CREATININE 0.4* 07/27/2014   CALCIUM 8.8 11/12/2014 0614   PROT 7.3 11/08/2014 0600   ALBUMIN 1.8* 11/08/2014 0600   AST 21 11/08/2014 0600   ALT 8 11/08/2014 0600   ALKPHOS 86 11/08/2014 0600   BILITOT 0.4 11/08/2014 0600   GFRNONAA >90 11/12/2014 0614   GFRAA >90 11/12/2014 0614    Assessment and Plan  Hypothyroidism Last TSH was 3.6 in 12/2014; will conti nue synthroid 116mcg daily and time fpr new TSH  Dysphagia Chronic PEG tube feeding, 2/2 MR, dementia;plan - cont PEG per family wishes  Severe mental retardation Chronic, unchanging, unchangable; plan cont supportive catre    Hennie Duos, MD

## 2015-10-19 NOTE — Assessment & Plan Note (Signed)
Chronic PEG tube feeding, 2/2 MR, dementia;plan - cont PEG per family wishes

## 2015-10-19 NOTE — Assessment & Plan Note (Signed)
Last TSH was 3.6 in 12/2014; will conti nue synthroid 131mcg daily and time fpr new TSH

## 2015-10-19 NOTE — Assessment & Plan Note (Signed)
Chronic, unchanging, unchangable; plan cont supportive catre

## 2015-10-24 ENCOUNTER — Encounter: Payer: Self-pay | Admitting: Internal Medicine

## 2015-10-24 ENCOUNTER — Non-Acute Institutional Stay (SKILLED_NURSING_FACILITY): Payer: Medicare Other | Admitting: Internal Medicine

## 2015-10-24 DIAGNOSIS — D72829 Elevated white blood cell count, unspecified: Secondary | ICD-10-CM

## 2015-10-24 NOTE — Progress Notes (Signed)
Patient ID: Dawn [name], female   DOB: 09/22/1946, 70 y.o.   MRN: [id number] MRN: [id number] Name: Dawn [name]  Sex: female Age: 70 y.o. DOB: [birth year]  [place] [facility]: [name] Facility/Room:205 Level Of Care: SNF Provider: Wille Celeste Emergency Contacts: Extended Emergency Contact Information Primary Emergency Contact: Sellars,[name] Address: [address]          [place] [address] [place] Phone: [phone] Relation: Sister Secondary Emergency Contact: Sera Cower [place] Phone: [phone] Relation: None  Code Status:   Allergies: Carvedilol and Ppd  Chief Complaint  Patient presents with  . Acute Visit   Secondary to leukocytosis HPI: Patient is 69 y.o. female vegetative female with seizures, dementia HLD hypothyroidism, CAd who is being seen recently for routine issues ofhypothyroidism, dysphagia and MR. Per nursing patient appears to be at her baseline-routine lab came back on January 5 that showed an elevated white count of 13.0-according nursing staff patient has been afebrile with no recent history of coughing or fever.    Past Medical History  Diagnosis Date  . Seizures ([place])   . Osteoporosis   . Thyroid disease     hypothyroid  . High cholesterol   . Cataracts, bilateral   . Optic atrophy   . Paraparesis (HCC)     mild  . Hypothyroidism   . Anginal pain ([place])   . Coronary artery disease   . CHF (congestive heart failure) ([place])   . Paroxysmal atrial fibrillation (HCC)   . Dysphagia   . Unspecified convulsions ([place])   . Dementia   . Optic atrophy   . Hypotension   . Anemia   . Hyperlipidemia   . Cataract   . Dental caries   . Esophageal reflux     Past Surgical History  Procedure Laterality Date  . Midline incision    . Central venous catheter insertion  05/15/2014       . Esophagogastroduodenoscopy N/A 06/03/2014    Procedure: ESOPHAGOGASTRODUODENOSCOPY (EGD);  Surgeon: Beryle Beams, MD;  [facility] END[facility];  Service: Endoscopy;  Laterality: N/A;      Medication List       This list is accurate as of: 10/24/15 11:59 PM.  Always use your most recent med list.               bisacodyl 10 MG suppository  Commonly known as:  DULCOLAX  Place 10 mg rectally daily as needed for moderate constipation.     CENTAMIN Liqd  Place 5 mLs into feeding tube daily.     clonazePAM 0.5 MG tablet  Commonly known as:  KLONOPIN  Take 0.5 mg by mouth every 6 (six) hours as needed for anxiety.     clonazePAM 0.5 MG tablet  Commonly known as:  KLONOPIN  Take one tablet via tube at bedtime scheduled for rest     docusate 50 MG/5ML liquid  Commonly known as:  COLACE  Place 20 mLs (200 mg total) into feeding tube 2 (two) times daily.     famotidine 40 MG/5ML suspension  Commonly known as:  PEPCID  Place 20 mg into feeding tube daily.     furosemide 10 MG/ML solution  Commonly known as:  LASIX  Place 20 mg into feeding tube 2 (two) times daily.     levETIRAcetam 100 MG/ML solution  Commonly known as:  KEPPRA  Place 5 mLs (500 mg total) into feeding tube daily at 6 (six)  AM.     levETIRAcetam 100 MG/ML solution  Commonly known as:  KEPPRA  750mg  every day at 1400hrs and 2000hrs     levothyroxine 100 MCG tablet  Commonly known as:  SYNTHROID, LEVOTHROID  100 mcg by PEG Tube route daily before breakfast.     magnesium hydroxide 400 MG/5ML suspension  Commonly known as:  MILK OF MAGNESIA  Place 30 mLs into feeding tube daily as needed for mild constipation.     metoprolol tartrate 25 mg/10 mL Susp  Commonly known as:  LOPRESSOR  Take 12.5 mg by mouth 3 (three) times daily.     potassium chloride 20 MEQ/15ML (10%) Soln  20 mEq by PEG Tube route daily.     Valproic Acid 250 MG/5ML Syrp syrup  Commonly known as:  DEPAKENE  Place 15 mLs (750 mg total) into feeding tube 3 (three) times daily.        No orders of the defined types were placed in this  encounter.    Immunization History  Administered Date(s) Administered  . Influenza-Unspecified 08/13/2014    Social History  Substance Use Topics  . Smoking status: Never Smoker   . Smokeless tobacco: Never Used  . Alcohol Use: No    Review of Systems  UTO 2/2 vegetative state   Filed Vitals:   10/24/15 1557  BP: 123/86  Pulse: 98  Temp: 98.1 F (36.7 C)  Resp: 20    Physical Exam  GENERAL APPEARANCE: eyes open, No acute distress  SKIN: No diaphoresis rash HEENT: Unremarkable RESPIRATORY: Breathing is even, unlabored. Lung sounds are clear   CARDIOVASCULAR: Heart RRR no murmurs, rubs or gallops. No peripheral edema  GASTROINTESTINAL: Abdomen is soft, non-tender, not distended w/ normal bowel sounds PEG site appears unremarkable without surrounding erythema or drainage.  GENITOURINARY: Bladder non tender, not distended  MUSCULOSKELETAL: wasting and contractures all extremities NEUROLOGIC: Cranial nerves 2-12 grossly intact; quadriparesis PSYCHIATRIC: vegetative  Patient Active Problem List   Diagnosis Date Noted  . Severe mental retardation 01/10/2015  . Severe dementia 01/10/2015  . Senile debility 01/10/2015  . 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 Acute respiratory failure with hypoxia ([place]) 11/20/2014  . Generalized weakness 11/20/2014  . Dysphagia 11/20/2014  . Dementia without behavioral disturbance 11/20/2014  . Development delay 11/02/2014  . Acute respiratory distress (HCC) 11/02/2014  . Paroxysmal ventricular tachycardia ([place]) 10/30/2014  . HCAP (healthcare-associated pneumonia) 10/26/2014  . Hematoma of right parietal scalp 08/13/2014  . Edema 08/09/2014  . Hematoma 08/05/2014  . Fall at nursing home 08/05/2014  . Leukocytosis 07/26/2014  . Tachycardia 07/11/2014  . Leukocytopenia, unspecified 07/11/2014  . Pneumonia 06/17/2014  . Sepsis ([place]) 06/12/2014  . Facial droop 06/12/2014  . Hypernatremia 06/12/2014  . PEG (percutaneous endoscopic  gastrostomy) status ([place]) 06/12/2014  . FUO (fever of unknown origin) 06/02/2014  . Paroxysmal a-fib ([place]) 05/14/2014  . Dysphasia 05/14/2014  . UTI (lower urinary tract infection) 05/14/2014  . Tooth decay 05/14/2014  . SIRS (systemic inflammatory response syndrome) ([place]) 12/15/2013  . Thrombocytopenia ([place]) 10/29/2013  . Hypotension 10/29/2013  . Anemia of chronic disease 10/29/2013  . Diastolic heart failure, NYHA class 2 ([place]) 10/29/2013  . Atrial fibrillation with rapid ventricular response ([place]) 10/28/2013  . Seizure disorder ([place]) 10/28/2013  . Hypothyroidism 10/28/2013  . HLD (hyperlipidemia) 10/28/2013  . Non-ST elevation myocardial infarction (NSTEMI) ([place]) 10/28/2013    Labs.  Generally fifth 2017.  WBC 13.0 hemoglobin 12.1 platelets 238.  Absolute neutrophils of 9  CBC  Component Value Date/Time   WBC 12.7* 11/12/2014 0614   WBC 20.8 10/19/2014   RBC 3.17* 11/12/2014 0614   RBC 1.74* 05/15/2014 0740   HGB 9.5* 11/12/2014 0614   HCT 29.6* 11/12/2014 0614   HCT 25.4* 11/03/2014 0725   PLT 449* 11/12/2014 0614   MCV 93.4 11/12/2014 0614   LYMPHSABS 2.7 11/12/2014 0614   MONOABS 1.7* 11/12/2014 0614   EOSABS 0.0 11/12/2014 0614   BASOSABS 0.0 11/12/2014 0614    CMP     Component Value Date/Time   NA 134* 11/12/2014 0614   NA 138 10/19/2014   K 4.4 11/12/2014 0614   CL 97 11/12/2014 0614   CO2 32 11/12/2014 0614   GLUCOSE 124* 11/12/2014 0614   BUN 9 11/12/2014 0614   BUN 26* 10/19/2014   CREATININE 0.54 11/12/2014 0614   CREATININE 0.4* 07/27/2014   CALCIUM 8.8 11/12/2014 0614   PROT 7.3 11/08/2014 0600   ALBUMIN 1.8* 11/08/2014 0600   AST 21 11/08/2014 0600   ALT 8 11/08/2014 0600   ALKPHOS 86 11/08/2014 0600   BILITOT 0.4 11/08/2014 0600   GFRNONAA >90 11/12/2014 0614   GFRAA >90 11/12/2014 0614    Assessment and Plan  Leukocytosis-unclear etiology-patient appears to be at her baseline per nursing--since lab is 45 days old would like to  update this to see if this normalizes or is a true trend-currently does not really show signs of increased chest congestion or fever will await update CBC  [id number]    Aijalon Demuro C,

## 2015-11-28 IMAGING — DX DG CHEST 1V
1 series · 1 of 1 positions shown · non-contrast
Comparison: 11/05/2014

CLINICAL DATA: Right pleural effusions status post right
thoracentesis.

EXAM:
CHEST - 1 VIEW

[chest ap]
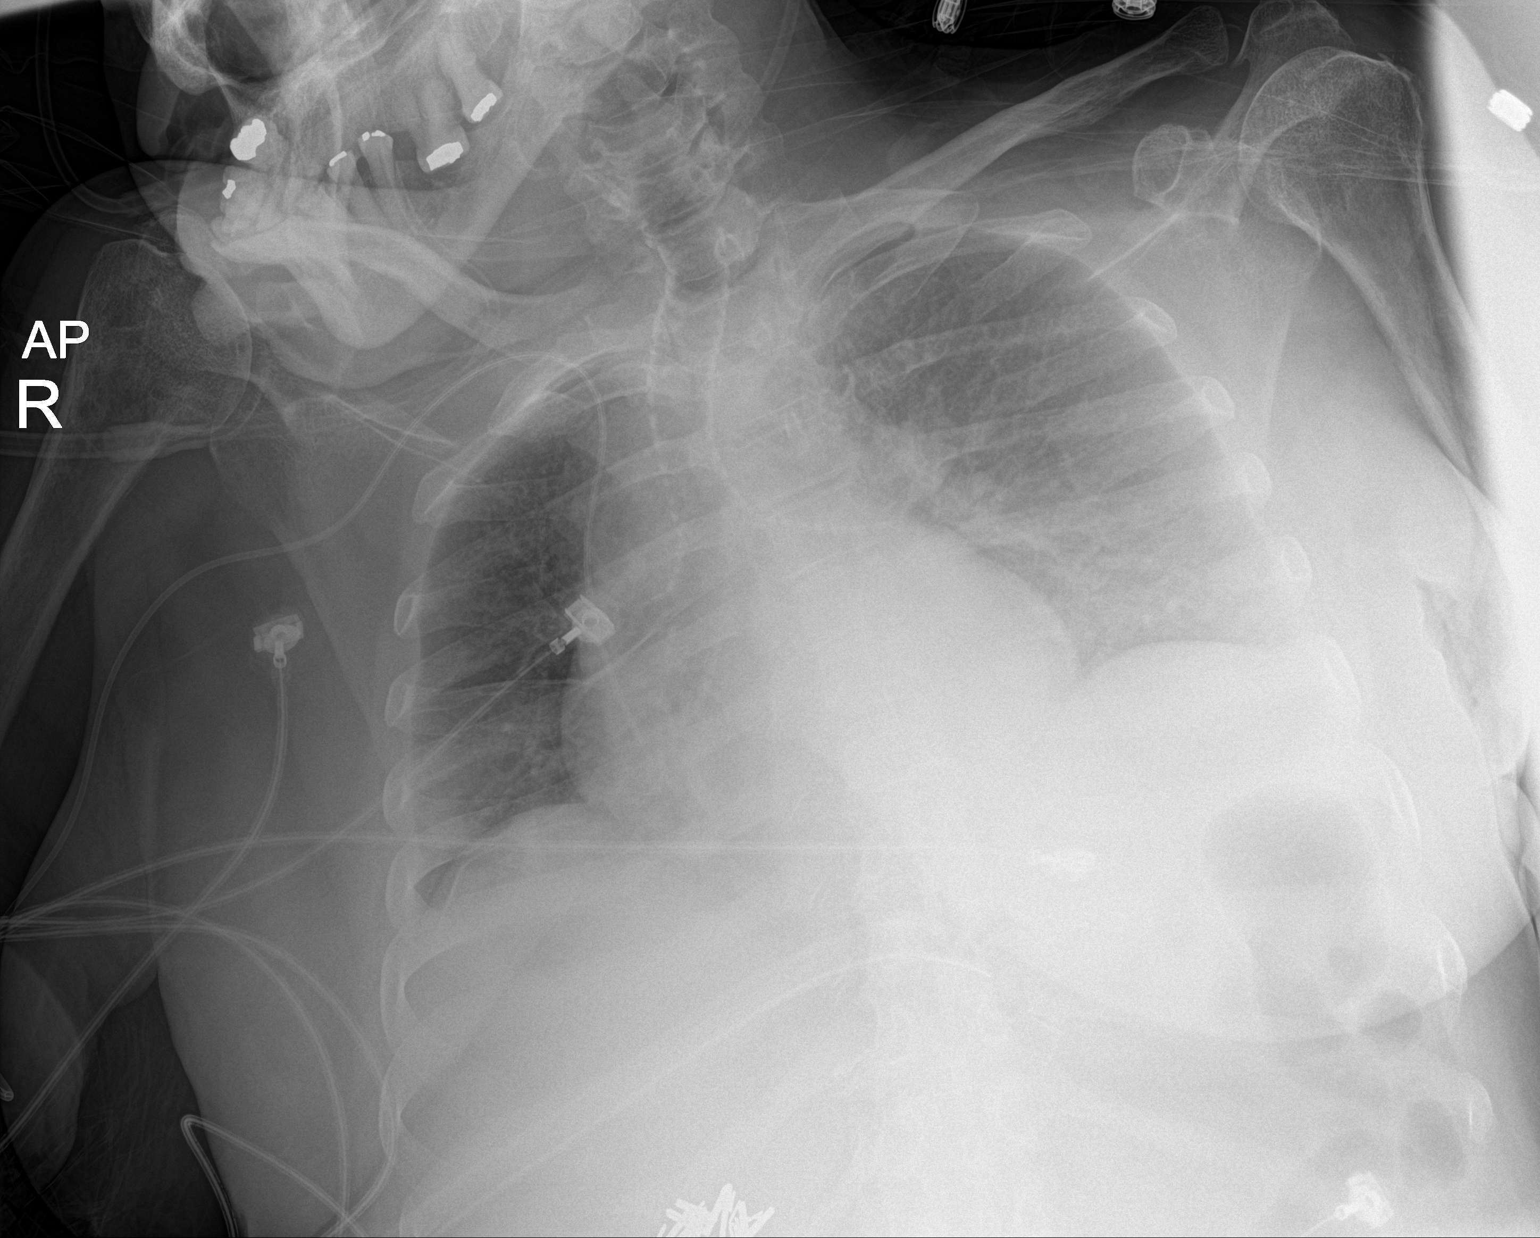

[1 of 1 positions shown; findings below may reference images not displayed]

FINDINGS: The patient is rotated to the right on today's exam. Mild
enlargement of the cardiopericardial silhouette. The right pleural
effusion is no longer visible. No pneumothorax.

Mild enlargement of the cardiopericardial silhouette. Right PICC
line tip: Right atrium.
IMPRESSION: 1. Right pleural effusion is no longer visible.  No pneumothorax.
2. Right PICC line tip:  Right atrium.
3. Mild enlargement of the cardiopericardial silhouette

## 2015-12-05 ENCOUNTER — Encounter: Payer: Self-pay | Admitting: Internal Medicine

## 2015-12-05 ENCOUNTER — Non-Acute Institutional Stay (SKILLED_NURSING_FACILITY): Payer: Medicare Other | Admitting: Internal Medicine

## 2015-12-05 DIAGNOSIS — E038 Other specified hypothyroidism: Secondary | ICD-10-CM

## 2015-12-05 DIAGNOSIS — I5030 Unspecified diastolic (congestive) heart failure: Secondary | ICD-10-CM | POA: Diagnosis not present

## 2015-12-05 DIAGNOSIS — Z931 Gastrostomy status: Secondary | ICD-10-CM

## 2015-12-05 DIAGNOSIS — G40909 Epilepsy, unspecified, not intractable, without status epilepticus: Secondary | ICD-10-CM | POA: Diagnosis not present

## 2015-12-05 DIAGNOSIS — F79 Unspecified intellectual disabilities: Secondary | ICD-10-CM | POA: Diagnosis not present

## 2015-12-05 DIAGNOSIS — I4891 Unspecified atrial fibrillation: Secondary | ICD-10-CM

## 2015-12-05 NOTE — Progress Notes (Signed)
Patient ID: NOLAN SANDEFER, female   DOB: [birth year], 70 y.o.   MRN: [id number] MRN: [id number] Name: Dawn [name]  Sex: female Age: 70 y.o. DOB: [birth year]  [place] #: [name] Facility/Room:205 Level Of Care: SNF Provider: Wille Celeste Emergency Contacts: Extended Emergency Contact Information Primary Emergency Contact: Sellars,[name] Address: [place]          [place] [address] [place] Phone: [phone] Relation: Sister Secondary Emergency Contact: Allexus Cower [place] Phone: [phone] Relation: None  Code Status:   Allergies: Carvedilol and Ppd  Chief Complaint  Patient presents with  . Medical Management of Chronic Issues    HPI: Patient is 70 y.o. 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  She is status post peg tube and appears to be tolerating feedings well  .    Past Medical History  Diagnosis Date  . Seizures ([place])   . Osteoporosis   . Thyroid disease     hypothyroid  . High cholesterol   . Cataracts, bilateral   . Optic atrophy   . Paraparesis (HCC)     mild  . Hypothyroidism   . Anginal pain ([place])   . Coronary artery disease   . CHF (congestive heart failure) ([employer])   . Paroxysmal atrial fibrillation (HCC)   . Dysphagia   . Unspecified convulsions ([place])   . Dementia   . Optic atrophy   . Hypotension   . Anemia   . Hyperlipidemia   . Cataract   . Dental caries   . Esophageal reflux     Past Surgical History  Procedure Laterality Date  . Midline incision    . Central venous catheter insertion  05/15/2014       . Esophagogastroduodenoscopy N/A 06/03/2014    Procedure: ESOPHAGOGASTRODUODENOSCOPY (EGD);  Surgeon: Beryle Beams,  MD;  Location: [facility] END[facility]COPY;  Service: Endoscopy;  Laterality: N/A;      Medication List       This list is accurate as of: 12/05/15  3:41 PM.  Always use your most recent med list.               bisacodyl 10 MG suppository  Commonly known as:  DULCOLAX  Place 10 mg rectally daily as needed for moderate constipation.     CENTAMIN Liqd  Place 5 mLs into feeding tube daily.     clonazePAM 0.5 MG tablet  Commonly known as:  KLONOPIN  Take 0.5 mg by mouth every 6 (six) hours as needed for anxiety.     clonazePAM 0.5 MG tablet  Commonly known as:  KLONOPIN  Take one tablet via tube at bedtime scheduled for rest     docusate 50 MG/5ML liquid  Commonly known as:  COLACE  Place 20 mLs (200 mg total) into feeding tube 2 (two) times daily.     famotidine 40 MG/5ML suspension  Commonly known as:  PEPCID  Place 20 mg into feeding tube daily.     furosemide 10 MG/ML solution  Commonly known as:  LASIX  Place 20 mg into feeding tube 2 (two) times daily.     levETIRAcetam 100 MG/ML solution  Commonly known as:  KEPPRA  Place 5 mLs (500  mg total) into feeding tube daily at 6 (six) AM.     levETIRAcetam 100 MG/ML solution  Commonly known as:  KEPPRA  750mg  every day at 1400hrs and 2000hrs     levothyroxine 100 MCG tablet  Commonly known as:  SYNTHROID, LEVOTHROID  100 mcg by PEG Tube route daily before breakfast.     magnesium hydroxide 400 MG/5ML suspension  Commonly known as:  MILK OF MAGNESIA  Place 30 mLs into feeding tube daily as needed for mild constipation.     metoprolol tartrate 25 mg/10 mL Susp  Commonly known as:  LOPRESSOR  Take 12.5 mg by mouth 3 (three) times daily.     potassium chloride 20 MEQ/15ML (10%) Soln  20 mEq by PEG Tube route daily.     Valproic Acid 250 MG/5ML Syrp syrup  Commonly known as:  DEPAKENE  Place 15 mLs (750 mg total) into feeding tube 3 (three) times daily.          Immunization History  Administered Date(s) Administered   . Influenza-Unspecified 08/13/2014    Social History  Substance Use Topics  . Smoking status: Never Smoker   . Smokeless tobacco: Never Used  . Alcohol Use: No    Review of Systems  UTO 2/2 vegetative state   Filed Vitals:   12/05/15 1535  BP: 117/56  Pulse: 66  Temp: 96.7 F (35.9 C)  Resp: 20    Physical Exam  GENERAL APPEARANCE: eyes open, No acute distress  SKIN: No diaphoresis rash HEENT: Unremarkable RESPIRATORY: Breathing is even, unlabored. Lung sounds are clear   CARDIOVASCULAR: Heart RRR no murmurs, rubs or gallops. No peripheral edema  GASTROINTESTINAL: Abdomen is soft, non-tender, not distended w/ normal bowel sounds PEG site appears unremarkable.  GENITOURINARY: Bladder non tender, not distended  MUSCULOSKELETAL: wasting and contractures all extremities NEUROLOGIC: Cranial nerves 2-12 grossly intact; quadriparesis PSYCHIATRIC: vegetative  Patient Active Problem List   Diagnosis Date Noted  . Severe mental retardation 01/10/2015  . Severe dementia 01/10/2015  . Senile debility 01/10/2015  . 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 Acute respiratory failure with hypoxia ([place]) 11/20/2014  . Generalized weakness 11/20/2014  . Dysphagia 11/20/2014  . Dementia without behavioral disturbance 11/20/2014  . Development delay 11/02/2014  . Acute respiratory distress (HCC) 11/02/2014  . Paroxysmal ventricular tachycardia ([place]) 10/30/2014  . HCAP (healthcare-associated pneumonia) 10/26/2014  . Hematoma of right parietal scalp 08/13/2014  . Edema 08/09/2014  . Hematoma 08/05/2014  . Fall at nursing home 08/05/2014  . Leukocytosis 07/26/2014  . Tachycardia 07/11/2014  . Leukocytopenia, unspecified 07/11/2014  . Pneumonia 06/17/2014  . Sepsis ([place]) 06/12/2014  . Facial droop 06/12/2014  . Hypernatremia 06/12/2014  . PEG (percutaneous endoscopic gastrostomy) status ([place]) 06/12/2014  . FUO (fever of unknown origin) 06/02/2014  . Paroxysmal a-fib ([place])  05/14/2014  . Dysphasia 05/14/2014  . UTI (lower urinary tract infection) 05/14/2014  . Tooth decay 05/14/2014  . SIRS (systemic inflammatory response syndrome) ([place]) 12/15/2013  . Thrombocytopenia ([place]) 10/29/2013  . Hypotension 10/29/2013  . Anemia of chronic disease 10/29/2013  . Diastolic heart failure, NYHA class 2 ([place]) 10/29/2013  . Atrial fibrillation with rapid ventricular response ([place]) 10/28/2013  . Seizure disorder ([place]) 10/28/2013  . Hypothyroidism 10/28/2013  . HLD (hyperlipidemia) 10/28/2013  . Non-ST elevation myocardial infarction (NSTEMI) ([place]) 10/28/2013   Labs.   10/25/2015.  WBC 11.3 hemoglobin 12.3 platelets 252.  10/20/2015.  Sodium 140 potassium 4.1 BUN 19 creatinine 0.5.  Alkaline phosphatase of 110 albumin 3.3  otherwise liver function tests within normal limits CBC    Component Value Date/Time   WBC 12.7* 11/12/2014 0614   WBC 20.8 10/19/2014   RBC 3.17* 11/12/2014 0614   RBC 1.74* 05/15/2014 0740   HGB 9.5* 11/12/2014 0614   HCT 29.6* 11/12/2014 0614   HCT 25.4* 11/03/2014 0725   PLT 449* 11/12/2014 0614   MCV 93.4 11/12/2014 0614   LYMPHSABS 2.7 11/12/2014 0614   MONOABS 1.7* 11/12/2014 0614   EOSABS 0.0 11/12/2014 0614   BASOSABS 0.0 11/12/2014 0614    CMP     Component Value Date/Time   NA 134* 11/12/2014 0614   NA 138 10/19/2014   K 4.4 11/12/2014 0614   CL 97 11/12/2014 0614   CO2 32 11/12/2014 0614   GLUCOSE 124* 11/12/2014 0614   BUN 9 11/12/2014 0614   BUN 26* 10/19/2014   CREATININE 0.54 11/12/2014 0614   CREATININE 0.4* 07/27/2014   CALCIUM 8.8 11/12/2014 0614   PROT 7.3 11/08/2014 0600   ALBUMIN 1.8* 11/08/2014 0600   AST 21 11/08/2014 0600   ALT 8 11/08/2014 0600   ALKPHOS 86 11/08/2014 0600   BILITOT 0.4 11/08/2014 0600   GFRNONAA >90 11/12/2014 0614   GFRAA >90 11/12/2014 0614    Assessment and Plan  History history of severe mental retardation.  This appears to be at baseline and chronic continue  supportive care appears to be stable in this regards.  History dysphagia status post chronic PEG tube-appears to be tolerating feedings well-.  Hypothyroidism TSH 2.7 on lab done last month on January 5 she continues on Synthroid 100 g daily.  History of atrial fibrillation this appears to be rate controlled continues on Lopressor 12.5 mg 3 times a day.  History of diastolic CHF class II-this appears well controlled again on the beta blocker she is also on Lasix 20 mg a day.  Seizure disorder  she is on Keppra 3 times a day as well as Depakote.--This appears to have stabilized at this point continue to monitor.  History of leukocytosis this appears to actually of trended down will update this last white count 11.3 on January 10 she does not show signs of fever or chills or sepsis Differential most recent lab was within normal range  [id number]      Gaspar Fowle C,

## 2015-12-07 LAB — BASIC METABOLIC PANEL
BUN: 16 mg/dL (ref 4–21)
Creatinine: 0.5 mg/dL (ref <=1.1)
Glucose: 66 mg/dL
Potassium: 4.8 mmol/L (ref 3.4–5.3)
Sodium: 137 mmol/L (ref 137–147)

## 2015-12-07 LAB — CBC AND DIFFERENTIAL
HEMATOCRIT: 39 % (ref 36–46)
Hemoglobin: 13.5 g/dL (ref 12.0–16.0)
PLATELETS: 274 10*3/uL (ref 150–399)
WBC: 10.6 10*3/mL

## 2015-12-27 ENCOUNTER — Encounter: Payer: Self-pay | Admitting: Internal Medicine

## 2015-12-27 ENCOUNTER — Non-Acute Institutional Stay (SKILLED_NURSING_FACILITY): Payer: Medicare Other | Admitting: Internal Medicine

## 2015-12-27 DIAGNOSIS — Z931 Gastrostomy status: Secondary | ICD-10-CM

## 2015-12-27 DIAGNOSIS — D638 Anemia in other chronic diseases classified elsewhere: Secondary | ICD-10-CM | POA: Diagnosis not present

## 2015-12-27 DIAGNOSIS — F0390 Unspecified dementia without behavioral disturbance: Secondary | ICD-10-CM | POA: Diagnosis not present

## 2015-12-27 NOTE — Progress Notes (Signed)
MRN: [id number] Name: Dawn [name]  Sex: female Age: 70 y.o. DOB: [birth year]  [place] [facility]: [name] Facility/Room:205-W Level Of Care: SNF Provider: Inocencio Homes MD  Emergency Contacts: Extended Emergency Contact Information Primary Emergency Contact: Sellars,[name] Address: [place]          [place] [address] [place] Phone: [phone] Relation: Sister Secondary Emergency Contact: Lesli Cower [place] of [place] Phone: [phone] Relation: None  Code Status: DNR  Allergies: Carvedilol and Ppd  Chief Complaint  Patient presents with  . Acute Visit    HPI: Patient is 70 y.o. female with dementia, MR who is being seen for routine issues of dementia, PEG status and anemia of chronic disease.   Past Medical History  Diagnosis Date  . Seizures ([place])   . Osteoporosis   . Thyroid disease     hypothyroid  . High cholesterol   . Cataracts, bilateral   . Optic atrophy   . Paraparesis (HCC)     mild  . Hypothyroidism   . Anginal pain ([place])   . Coronary artery disease   . CHF (congestive heart failure) ([place])   . Paroxysmal atrial fibrillation (HCC)   . Dysphagia   . Unspecified convulsions ([place])   . Dementia   . Optic atrophy   . Hypotension   . Anemia   . Hyperlipidemia   . Cataract   . Dental caries   . Esophageal reflux     Past Surgical History  Procedure Laterality Date  . Midline incision    . Central venous catheter insertion  05/15/2014       . Esophagogastroduodenoscopy N/A 06/03/2014    Procedure: ESOPHAGOGASTRODUODENOSCOPY (EGD);  Surgeon: Beryle Beams, MD;  Location: [facility] ENDOSCOPY;  Service: Endoscopy;  Laterality: N/A;      Medication List       This list is accurate as of: 12/27/15 11:59 PM.  Always use your most recent med list.               CENTAMIN Liqd  Place 5 mLs into feeding tube daily.     clonazePAM 0.5 MG tablet  Commonly known as:  KLONOPIN  Take 0.5 mg by mouth every 6 (six)  hours as needed for anxiety.     clonazePAM 0.5 MG tablet  Commonly known as:  KLONOPIN  Take one tablet via tube at bedtime scheduled for rest     docusate 50 MG/5ML liquid  Commonly known as:  COLACE  Place 20 mLs (200 mg total) into feeding tube 2 (two) times daily.     famotidine 40 MG/5ML suspension  Commonly known as:  PEPCID  Place 20 mg into feeding tube daily.     furosemide 10 MG/ML solution  Commonly known as:  LASIX  Place 20 mg into feeding tube 2 (two) times daily.     levETIRAcetam 100 MG/ML solution  Commonly known as:  KEPPRA  Place 750 mg into feeding tube daily. 2 p and 8 p     levETIRAcetam 100 MG/ML solution  Commonly known as:  KEPPRA  Place 5 mLs (500 mg total) into feeding tube daily at 6 (six) AM.     levothyroxine 100 MCG tablet  Commonly known as:  SYNTHROID, LEVOTHROID  100 mcg by PEG Tube route daily before breakfast.     metoprolol tartrate 25 mg/10 mL Susp  Commonly known as:  LOPRESSOR  Take 12.5 mg by mouth 3 (three) times daily.  potassium chloride 20 MEQ/15ML (10%) Soln  20 mEq by PEG Tube route daily. Dilute with water before administered.     Valproic Acid 250 MG/5ML Syrp syrup  Commonly known as:  DEPAKENE  Place 15 mLs (750 mg total) into feeding tube 3 (three) times daily.        Meds ordered this encounter  Medications  . levETIRAcetam (KEPPRA) 100 MG/ML solution    Sig: Place 750 mg into feeding tube daily. 2 p and 8 p    Immunization History  Administered Date(s) Administered  . Influenza-Unspecified 08/13/2014    Social History  Substance Use Topics  . Smoking status: Never Smoker   . Smokeless tobacco: Never Used  . Alcohol Use: No    Review of Systems  UTO 2/2 dementia    Filed Vitals:   12/27/15 1428  BP: 90/57  Pulse: 91  Temp: 97 F (36.1 C)  Resp: 17    Physical Exam  GENERAL APPEARANCE: eyes open, No acute distress  SKIN: No diaphoresis rash HEENT: Unremarkable RESPIRATORY: Breathing  is even, unlabored. Lung sounds are clear   CARDIOVASCULAR: Heart RRR no murmurs, rubs or gallops. No peripheral edema  GASTROINTESTINAL: Abdomen is soft, non-tender, not distended w/ normal bowel soundsPEG  GENITOURINARY: Bladder non tender, not distended  MUSCULOSKELETAL: wasting and contractures all 4 extremitis NEUROLOGIC: vegetative, unpurposeful upper ext movements at times PSYCHIATRIC: vegetative  Patient Active Problem List   Diagnosis Date Noted  . Severe mental retardation 01/10/2015  . Severe dementia 01/10/2015  . Senile debility 01/10/2015  . 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 Acute respiratory failure with hypoxia ([place]) 11/20/2014  . Generalized weakness 11/20/2014  . Dysphagia 11/20/2014  . Dementia without behavioral disturbance 11/20/2014  . Development delay 11/02/2014  . Acute respiratory distress (HCC) 11/02/2014  . Paroxysmal ventricular tachycardia ([place]) 10/30/2014  . HCAP (healthcare-associated pneumonia) 10/26/2014  . Hematoma of right parietal scalp 08/13/2014  . Edema 08/09/2014  . Hematoma 08/05/2014  . Fall at nursing home 08/05/2014  . Leukocytosis 07/26/2014  . Tachycardia 07/11/2014  . Leukocytopenia, unspecified 07/11/2014  . Pneumonia 06/17/2014  . Sepsis ([place]) 06/12/2014  . Facial droop 06/12/2014  . Hypernatremia 06/12/2014  . PEG (percutaneous endoscopic gastrostomy) status ([place]) 06/12/2014  . FUO (fever of unknown origin) 06/02/2014  . Paroxysmal a-fib ([place]) 05/14/2014  . Dysphasia 05/14/2014  . UTI (lower urinary tract infection) 05/14/2014  . Tooth decay 05/14/2014  . SIRS (systemic inflammatory response syndrome) ([place]) 12/15/2013  . Thrombocytopenia ([place]) 10/29/2013  . Hypotension 10/29/2013  . Anemia of chronic disease 10/29/2013  . Diastolic heart failure, NYHA class 2 ([place]) 10/29/2013  . Atrial fibrillation with rapid ventricular response ([place]) 10/28/2013  . Seizure disorder ([place]) 10/28/2013  . Hypothyroidism 10/28/2013   . HLD (hyperlipidemia) 10/28/2013  . Non-ST elevation myocardial infarction (NSTEMI) ([place]) 10/28/2013    CBC    Component Value Date/Time   WBC 10.6 12/07/2015   WBC 12.7* 11/12/2014 0614   RBC 3.17* 11/12/2014 0614   RBC 1.74* 05/15/2014 0740   HGB 13.5 12/07/2015   HCT 39 12/07/2015   HCT 25.4* 11/03/2014 0725   PLT 274 12/07/2015   MCV 93.4 11/12/2014 0614   LYMPHSABS 2.7 11/12/2014 0614   MONOABS 1.7* 11/12/2014 0614   EOSABS 0.0 11/12/2014 0614   BASOSABS 0.0 11/12/2014 0614    CMP     Component Value Date/Time   NA 137 12/07/2015   NA 134* 11/12/2014 0614   K 4.8 12/07/2015   CL 97  11/12/2014 0614   CO2 32 11/12/2014 0614   GLUCOSE 124* 11/12/2014 0614   BUN 16 12/07/2015   BUN 9 11/12/2014 0614   CREATININE 0.5 12/07/2015   CREATININE 0.54 11/12/2014 0614   CALCIUM 8.8 11/12/2014 0614   PROT 7.3 11/08/2014 0600   ALBUMIN 1.8* 11/08/2014 0600   AST 21 11/08/2014 0600   ALT 8 11/08/2014 0600   ALKPHOS 86 11/08/2014 0600   BILITOT 0.4 11/08/2014 0600   GFRNONAA >90 11/12/2014 0614   GFRAA >90 11/12/2014 0614    Assessment and Plan  Dementia without behavioral disturbance Dementia and MR, vegetative at baseline; dementia specific meds of noi use ;cont to monitor  PEG (percutaneous endoscopic gastrostomy) status Pt vegetative, takes nothing by mouth;plan - monitor  Anemia of chronic disease Hb is now 13.5, excelent; will monitor at intervals    Inocencio Homes  MD

## 2015-12-27 NOTE — Assessment & Plan Note (Signed)
Pt vegetative, takes nothing by mouth;plan - monitor

## 2015-12-27 NOTE — Progress Notes (Deleted)
MRN: [id number] Name: Dawn [name]  Sex: female Age: 70 y.o. DOB: May 03, 1946  [place] #:  Facility/Room:205-W Level Of Care: SNF Provider: Inocencio Homes MD Emergency Contacts: Extended Emergency Contact Information Primary Emergency Contact: Sellars,[name] Address: [address]          [place] [address] [place] Phone: [phone] Relation: Sister Secondary Emergency Contact: Lisabeth Cower [place] Phone: [phone] Relation: None  Code Status: DNR  Allergies: Carvedilol and Ppd  Chief Complaint  Patient presents with  . Medical Management of Chronic Issues    HPI: Patient is 70 y.o. female who   Past Medical History  Diagnosis Date  . Seizures ([place])   . Osteoporosis   . Thyroid disease     hypothyroid  . High cholesterol   . Cataracts, bilateral   . Optic atrophy   . Paraparesis (HCC)     mild  . Hypothyroidism   . Anginal pain ([place])   . Coronary artery disease   . CHF (congestive heart failure) ([place])   . Paroxysmal atrial fibrillation (HCC)   . Dysphagia   . Unspecified convulsions ([place])   . Dementia   . Optic atrophy   . Hypotension   . Anemia   . Hyperlipidemia   . Cataract   . Dental caries   . Esophageal reflux     Past Surgical History  Procedure Laterality Date  . Midline incision    . Central venous catheter insertion  05/15/2014       . Esophagogastroduodenoscopy N/A 06/03/2014    Procedure: ESOPHAGOGASTRODUODENOSCOPY (EGD);  Surgeon: Beryle Beams, MD;  Location: [facility] ENDOSCOPY;  Service: Endoscopy;  Laterality: N/A;      Medication List       This list is accurate as of: 12/27/15  3:08 PM.  Always use your most recent med list.               CENTAMIN Liqd  Place 5 mLs into feeding tube daily.     clonazePAM 0.5 MG tablet  Commonly known as:  KLONOPIN  Take 0.5 mg by mouth every 6 (six) hours as needed for anxiety.     clonazePAM 0.5 MG tablet  Commonly known as:  KLONOPIN   Take one tablet via tube at bedtime scheduled for rest     docusate 50 MG/5ML liquid  Commonly known as:  COLACE  Place 20 mLs (200 mg total) into feeding tube 2 (two) times daily.     famotidine 40 MG/5ML suspension  Commonly known as:  PEPCID  Place 20 mg into feeding tube daily.     furosemide 10 MG/ML solution  Commonly known as:  LASIX  Place 20 mg into feeding tube 2 (two) times daily.     levETIRAcetam 100 MG/ML solution  Commonly known as:  KEPPRA  Place 750 mg into feeding tube 2 (two) times daily. 2 p and 8 p     levETIRAcetam 100 MG/ML solution  Commonly known as:  KEPPRA  Place 5 mLs (500 mg total) into feeding tube daily at 6 (six) AM.     levothyroxine 100 MCG tablet  Commonly known as:  SYNTHROID, LEVOTHROID  100 mcg by PEG Tube route daily before breakfast.     metoprolol tartrate 25 mg/10 mL Susp  Commonly known as:  LOPRESSOR  Take 12.5 mg by mouth 3 (three) times daily.     potassium chloride 20 MEQ/15ML (10%) Soln  20 mEq by PEG  Tube route daily. Dilute with water before administered.     Valproic Acid 250 MG/5ML Syrp syrup  Commonly known as:  DEPAKENE  Place 15 mLs (750 mg total) into feeding tube 3 (three) times daily.        Meds ordered this encounter  Medications  . levETIRAcetam (KEPPRA) 100 MG/ML solution    Sig: Place 750 mg into feeding tube 2 (two) times daily. 2 p and 8 p    Immunization History  Administered Date(s) Administered  . Influenza-Unspecified 08/13/2014    Social History  Substance Use Topics  . Smoking status: Never Smoker   . Smokeless tobacco: Never Used  . Alcohol Use: No    Review of Systems  DATA OBTAINED: from patient, nurse, medical record, family member GENERAL:  no fevers, fatigue, appetite changes SKIN: No itching, rash HEENT: No complaint RESPIRATORY: No cough, wheezing, SOB CARDIAC: No chest pain, palpitations, lower extremity edema  GI: No abdominal pain, No N/V/D or constipation, No  heartburn or reflux  GU: No dysuria, frequency or urgency, or incontinence  MUSCULOSKELETAL: No unrelieved bone/joint pain NEUROLOGIC: No headache, dizziness  PSYCHIATRIC: No overt anxiety or sadness  Filed Vitals:   12/27/15 1428  BP: 90/57  Pulse: 91  Temp: 97 F (36.1 C)  Resp: 17    Physical Exam  GENERAL APPEARANCE: Alert, conversant, No acute distress  SKIN: No diaphoresis rash, or wounds HEENT: Unremarkable RESPIRATORY: Breathing is even, unlabored. Lung sounds are clear   CARDIOVASCULAR: Heart RRR no murmurs, rubs or gallops. No peripheral edema  GASTROINTESTINAL: Abdomen is soft, non-tender, not distended w/ normal bowel sounds.  GENITOURINARY: Bladder non tender, not distended  MUSCULOSKELETAL: No abnormal joints or musculature NEUROLOGIC: Cranial nerves 2-12 grossly intact. Moves all extremities PSYCHIATRIC: Mood and affect appropriate to situation, no behavioral issues  Patient Active Problem List   Diagnosis Date Noted  . Severe mental retardation 01/10/2015  . Severe dementia 01/10/2015  . Senile debility 01/10/2015  . 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 Acute respiratory failure with hypoxia ([place]) 11/20/2014  . Generalized weakness 11/20/2014  . Dysphagia 11/20/2014  . Dementia without behavioral disturbance 11/20/2014  . Development delay 11/02/2014  . Acute respiratory distress (HCC) 11/02/2014  . Paroxysmal ventricular tachycardia ([place]) 10/30/2014  . HCAP (healthcare-associated pneumonia) 10/26/2014  . Hematoma of right parietal scalp 08/13/2014  . Edema 08/09/2014  . Hematoma 08/05/2014  . Fall at nursing home 08/05/2014  . Leukocytosis 07/26/2014  . Tachycardia 07/11/2014  . Leukocytopenia, unspecified 07/11/2014  . Pneumonia 06/17/2014  . Sepsis ([place]) 06/12/2014  . Facial droop 06/12/2014  . Hypernatremia 06/12/2014  . PEG (percutaneous endoscopic gastrostomy) status ([place]) 06/12/2014  . FUO (fever of unknown origin) 06/02/2014  .  Paroxysmal a-fib ([place]) 05/14/2014  . Dysphasia 05/14/2014  . UTI (lower urinary tract infection) 05/14/2014  . Tooth decay 05/14/2014  . SIRS (systemic inflammatory response syndrome) ([place]) 12/15/2013  . Thrombocytopenia ([place]) 10/29/2013  . Hypotension 10/29/2013  . Anemia of chronic disease 10/29/2013  . Diastolic heart failure, NYHA class 2 ([place]) 10/29/2013  . Atrial fibrillation with rapid ventricular response ([place]) 10/28/2013  . Seizure disorder ([place]) 10/28/2013  . Hypothyroidism 10/28/2013  . HLD (hyperlipidemia) 10/28/2013  . Non-ST elevation myocardial infarction (NSTEMI) ([place]) 10/28/2013    CBC    Component Value Date/Time   WBC 10.6 12/07/2015   WBC 12.7* 11/12/2014 0614   RBC 3.17* 11/12/2014 0614   RBC 1.74* 05/15/2014 0740   HGB 13.5 12/07/2015   HCT 39 12/07/2015  HCT 25.4* 11/03/2014 0725   PLT 274 12/07/2015   MCV 93.4 11/12/2014 0614   LYMPHSABS 2.7 11/12/2014 0614   MONOABS 1.7* 11/12/2014 0614   EOSABS 0.0 11/12/2014 0614   BASOSABS 0.0 11/12/2014 0614    CMP     Component Value Date/Time   NA 137 12/07/2015   NA 134* 11/12/2014 0614   K 4.8 12/07/2015   CL 97 11/12/2014 0614   CO2 32 11/12/2014 0614   GLUCOSE 124* 11/12/2014 0614   BUN 16 12/07/2015   BUN 9 11/12/2014 0614   CREATININE 0.5 12/07/2015   CREATININE 0.54 11/12/2014 0614   CALCIUM 8.8 11/12/2014 0614   PROT 7.3 11/08/2014 0600   ALBUMIN 1.8* 11/08/2014 0600   AST 21 11/08/2014 0600   ALT 8 11/08/2014 0600   ALKPHOS 86 11/08/2014 0600   BILITOT 0.4 11/08/2014 0600   GFRNONAA >90 11/12/2014 0614   GFRAA >90 11/12/2014 0614    Assessment and Plan  No problem-specific assessment & plan notes found for this encounter.   Inocencio Homes  MD

## 2015-12-27 NOTE — Assessment & Plan Note (Signed)
Hb is now 13.5, excelent; will monitor at intervals

## 2015-12-27 NOTE — Assessment & Plan Note (Signed)
Dementia and MR, vegetative at baseline; dementia specific meds of noi use ;cont to monitor

## 2015-12-30 ENCOUNTER — Other Ambulatory Visit: Payer: Self-pay | Admitting: *Deleted

## 2015-12-30 MED ORDER — CLONAZEPAM 0.5 MG PO TABS: ORAL_TABLET | ORAL | Status: DC

## 2015-12-30 NOTE — Telephone Encounter (Signed)
[facility] 

## 2016-01-14 DIAGNOSIS — A419 Sepsis, unspecified organism: Secondary | ICD-10-CM

## 2016-01-14 HISTORY — DX: Sepsis, unspecified organism: A41.9

## 2016-02-06 ENCOUNTER — Encounter: Payer: Self-pay | Admitting: Internal Medicine

## 2016-02-06 ENCOUNTER — Non-Acute Institutional Stay (SKILLED_NURSING_FACILITY): Payer: Medicare Other | Admitting: Internal Medicine

## 2016-02-06 DIAGNOSIS — I4891 Unspecified atrial fibrillation: Secondary | ICD-10-CM

## 2016-02-06 DIAGNOSIS — R112 Nausea with vomiting, unspecified: Secondary | ICD-10-CM

## 2016-02-06 DIAGNOSIS — G40909 Epilepsy, unspecified, not intractable, without status epilepticus: Secondary | ICD-10-CM

## 2016-02-06 DIAGNOSIS — F0390 Unspecified dementia without behavioral disturbance: Secondary | ICD-10-CM

## 2016-02-06 NOTE — Progress Notes (Signed)
Patient ID: Dawn [name], female   DOB: Sep 09, 1946, 70 y.o.   MRN: [id number]  MRN: [id number] Name: Dawn [name]  Sex: female Age: 70 y.o. DOB: Jun 24, 1946  [place] #: [name] Facility/Room:205 Level Of Care: SNF Provider: Oralia Manis Emergency Contacts: Extended Emergency Contact Information Primary Emergency Contact: Sellars,[name] Address: [address]          [place] [address] [place] Phone: [phone] Relation: Sister Secondary Emergency Contact: Abbagale Cower [place] Phone: [phone] Relation: None  Code Status:   Allergies: Carvedilol and Ppd  Chief Complaint  Patient presents with  . Acute Visit  Secondary to vomiting--Medical management of seizures dementia coronary artery disease  HPI: Patient is 70 y.o. 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  She is PEG tube dependent and take feedings are currently being held No  other recent acute episodes per nursing-she does have a history seizure disorder but apparently this is well controlled currently on Keppra 3 times a day as well as Depakote.   Currently she is resting in bed comfortably-there've been no further vomiting episodes to my knowledge.  Vital signs appear to be stable  .    Past Medical History  Diagnosis Date  . Seizures ([place])   . Osteoporosis   . Thyroid disease     hypothyroid  . High cholesterol   . Cataracts, bilateral   . Optic atrophy   . Paraparesis (HCC)     mild  . Hypothyroidism   . Anginal pain ([place])   . Coronary artery disease   . CHF (congestive heart failure) ([place])   . Paroxysmal atrial fibrillation (HCC)   . Dysphagia   . Unspecified convulsions ([place])   . Dementia   . Optic atrophy   . Hypotension   . Anemia   . Hyperlipidemia   . Cataract   . Dental caries   .  Esophageal reflux     Past Surgical History  Procedure Laterality Date  . Midline incision    . Central venous catheter insertion  05/15/2014       . Esophagogastroduodenoscopy N/A 06/03/2014    Procedure: ESOPHAGOGASTRODUODENOSCOPY (EGD);  Surgeon: Beryle Beams, MD;  Location[facility] ENDOSCOPY;  Service: Endoscopy;  Laterality: N/A;      Medication List       This list is accurate as of: 02/06/16 12:35 PM.  Always use your most recent med list.               CENTAMIN Liqd  Place 5 mLs into feeding tube daily.     clonazePAM 0.5 MG tablet  Commonly known as:  KLONOPIN  Give 1 tablet vie peg tube every 6 hours for cerebral palsy     docusate 50 MG/5ML liquid  Commonly known as:  COLACE  Place 20 mLs (200 mg total) into feeding tube 2 (two) times daily.     famotidine 20 MG tablet  Commonly known as:  PEPCID  Give 1 tablet via peg tube daily ( 20 mg )     furosemide 10 MG/ML solution  Commonly known as:  LASIX  Place 20 mg into feeding tube 2 (two) times daily.     levETIRAcetam 100 MG/ML solution  Commonly known as:  KEPPRA  Place 750 mg into feeding tube daily. 2 p and 8 p  levETIRAcetam 100 MG/ML solution  Commonly known as:  KEPPRA  Place 5 mLs (500 mg total) into feeding tube daily at 6 (six) AM.     levothyroxine 100 MCG tablet  Commonly known as:  SYNTHROID, LEVOTHROID  100 mcg by PEG Tube route daily before breakfast.     metoprolol tartrate 25 mg/10 mL Susp  Commonly known as:  LOPRESSOR  Take 12.5 mg by mouth 3 (three) times daily.     potassium chloride 20 MEQ/15ML (10%) Soln  20 mEq by PEG Tube route daily. Dilute with water before administered.     Valproic Acid 250 MG/5ML Syrp syrup  Commonly known as:  DEPAKENE  Place 15 mLs (750 mg total) into feeding tube 3 (three) times daily.          Immunization History  Administered Date(s) Administered  . Influenza-Unspecified 08/13/2014    Social History  Substance Use Topics  . Smoking  status: Never Smoker   . Smokeless tobacco: Never Used  . Alcohol Use: No    Review of Systems  UTO 2/2 vegetative state--please see history of present illness     Physical Exam Temperature 97.0 pulse 79 respirations 20 blood pressure 118/86 GENERAL APPEARANCE:  No acute distress--lying in bed no signs of distress or discomfort SKIN: No diaphoresis rash HEENT: Patient holding her eyes shut-oropharynx was difficult to assess secondary to patient closing her mouth RESPIRATORY: Breathing is even, unlabored. Lung sounds are clear  very poor effort however CARDIOVASCULAR: Heart RRR no murmurs, rubs or gallops. No peripheral edema  GASTROINTESTINAL: Abdomen is soft, non-tender, not distended w/ slightly hypoactive bowel sounds PEG site appears unremarkable without drainage bleeding or surrounding erythema.  GENITOURINARY: Bladder non tender, not distended  MUSCULOSKELETAL: wasting and contractures all extremities NEUROLOGIC: Cranial nerves 2-12 grossly intact; quadriparesis PSYCHIATRIC: vegetative at baseline  Patient Active Problem List   Diagnosis Date Noted  . Severe mental retardation 01/10/2015  . Severe dementia 01/10/2015  . Senile debility 01/10/2015  . 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 Acute respiratory failure with hypoxia ([place]) 11/20/2014  . Generalized weakness 11/20/2014  . Dysphagia 11/20/2014  . Dementia without behavioral disturbance 11/20/2014  . Development delay 11/02/2014  . Acute respiratory distress (HCC) 11/02/2014  . Paroxysmal ventricular tachycardia ([place]) 10/30/2014  . HCAP (healthcare-associated pneumonia) 10/26/2014  . Hematoma of right parietal scalp 08/13/2014  . Edema 08/09/2014  . Hematoma 08/05/2014  . Fall at nursing home 08/05/2014  . Leukocytosis 07/26/2014  . Tachycardia 07/11/2014  . Leukocytopenia, unspecified 07/11/2014  . Pneumonia 06/17/2014  . Sepsis ([place]) 06/12/2014  . Facial droop 06/12/2014  . Hypernatremia 06/12/2014  .  PEG (percutaneous endoscopic gastrostomy) status ([place]) 06/12/2014  . FUO (fever of unknown origin) 06/02/2014  . Paroxysmal a-fib ([place]) 05/14/2014  . Dysphasia 05/14/2014  . UTI (lower urinary tract infection) 05/14/2014  . Tooth decay 05/14/2014  . SIRS (systemic inflammatory response syndrome) ([place]) 12/15/2013  . Thrombocytopenia ([place]) 10/29/2013  . Hypotension 10/29/2013  . Anemia of chronic disease 10/29/2013  . Diastolic heart failure, NYHA class 2 ([place]) 10/29/2013  . Atrial fibrillation with rapid ventricular response ([place]) 10/28/2013  . Seizure disorder ([place]) 10/28/2013  . Hypothyroidism 10/28/2013  . HLD (hyperlipidemia) 10/28/2013  . Non-ST elevation myocardial infarction (NSTEMI) ([place]) 10/28/2013   Labs. 12/07/2014.  WBC 10.6 hemoglobin 13.5 platelets 274.  Sodium 137 potassium 4.8 BUN 16 creatinine 0.5  10/25/2015.  WBC 11.3 hemoglobin 12.3 platelets 252.  10/20/2015.  Sodium 140 potassium 4.1 BUN 19 creatinine 0.5.  Alkaline phosphatase of 110 albumin 3.3 otherwise liver function tests within normal limits CBC    Component Value Date/Time   WBC 10.6 12/07/2015   WBC 12.7* 11/12/2014 0614   RBC 3.17* 11/12/2014 0614   RBC 1.74* 05/15/2014 0740   HGB 13.5 12/07/2015   HCT 39 12/07/2015   HCT 25.4* 11/03/2014 0725   PLT 274 12/07/2015   MCV 93.4 11/12/2014 0614   LYMPHSABS 2.7 11/12/2014 0614   MONOABS 1.7* 11/12/2014 0614   EOSABS 0.0 11/12/2014 0614   BASOSABS 0.0 11/12/2014 0614    CMP     Component Value Date/Time   NA 137 12/07/2015   NA 134* 11/12/2014 0614   K 4.8 12/07/2015   CL 97 11/12/2014 0614   CO2 32 11/12/2014 0614   GLUCOSE 124* 11/12/2014 0614   BUN 16 12/07/2015   BUN 9 11/12/2014 0614   CREATININE 0.5 12/07/2015   CREATININE 0.54 11/12/2014 0614   CALCIUM 8.8 11/12/2014 0614   PROT 7.3 11/08/2014 0600   ALBUMIN 1.8* 11/08/2014 0600   AST 21 11/08/2014 0600   ALT 8 11/08/2014 0600   ALKPHOS 86 11/08/2014 0600   BILITOT  0.4 11/08/2014 0600   GFRNONAA >90 11/12/2014 0614   GFRAA >90 11/12/2014 0614    Assessment and Plan  Episodic vomiting-with history of PEG tube-again PEG tube feedings are being held she will need a chest x-ray to rule out possible aspiration-also will rub obtain an abdominal x-ray rule out obstruction ileus PEG tube abnormalities.  Also will obtain a UA C&S to rule out UTI etiology of vomiting.  Also will obtain stat blood work including a CBC CMP and TSH tomorrow a.m. as well as a valproic acid level.  Monitor vital signs pulse ox every 4 hours 2 and then every shift.     History history of severe mental retardation.  This appears to be at baseline and chronic continue supportive care appears to be stable in this regards.  History dysphagia status post chronic PEG tube This has been stable in the past  Hypothyroidism TSH 2.7 on lab done last month on January 5 she continues on Synthroid 100 g daily.  History of atrial fibrillation this appears to be rate controlled continues on Lopressor 12.5 mg 3 times a day.  History of diastolic CHF class II-this appears well controlled again on the beta blocker she is also on Lasix 20 mg a day.  Seizure disorder  she is on Keppra 3 times a day as well as Depakote.--This appears to have stabilized at this point continue to monitor.   Addendum.  We have obtained a chest x-ray results which did not show any acute process-  There's been no further vomiting we'll cautiously restart tube feedings at 20 mL an hour 4 hours and if tolerated increase to 30 mL an hour for 4 hours and then titrate back up to her baseline of 39 mL an hour if she tolerates the  titration.    [id number]--of note greater than 35 minutes spent assessing patient-discussing her status with nursing staff-reviewing her chart-labs-and studies-and coordinating and formulating a plan of care-of note greater than 50% of time spent coordinating plan of  care      Oralia Manis,

## 2016-02-08 ENCOUNTER — Emergency Department (HOSPITAL_COMMUNITY): Payer: Medicare Other

## 2016-02-08 ENCOUNTER — Inpatient Hospital Stay (HOSPITAL_COMMUNITY)
Admission: EM | Admit: 2016-02-08 | Discharge: 2016-02-14 | DRG: 871 | Disposition: A | Discharge status: Skilled Nursing Facility | Payer: Medicare Other | Attending: Internal Medicine | Admitting: Internal Medicine

## 2016-02-08 DIAGNOSIS — J181 Lobar pneumonia, unspecified organism: Secondary | ICD-10-CM

## 2016-02-08 DIAGNOSIS — I5032 Chronic diastolic (congestive) heart failure: Secondary | ICD-10-CM | POA: Diagnosis present

## 2016-02-08 DIAGNOSIS — Y95 Nosocomial condition: Secondary | ICD-10-CM | POA: Diagnosis present

## 2016-02-08 DIAGNOSIS — K219 Gastro-esophageal reflux disease without esophagitis: Secondary | ICD-10-CM | POA: Diagnosis present

## 2016-02-08 DIAGNOSIS — E7800 Pure hypercholesterolemia, unspecified: Secondary | ICD-10-CM | POA: Diagnosis present

## 2016-02-08 DIAGNOSIS — D72829 Elevated white blood cell count, unspecified: Secondary | ICD-10-CM | POA: Diagnosis present

## 2016-02-08 DIAGNOSIS — A419 Sepsis, unspecified organism: Secondary | ICD-10-CM | POA: Diagnosis present

## 2016-02-08 DIAGNOSIS — D649 Anemia, unspecified: Secondary | ICD-10-CM | POA: Diagnosis present

## 2016-02-08 DIAGNOSIS — R569 Unspecified convulsions: Secondary | ICD-10-CM | POA: Diagnosis not present

## 2016-02-08 DIAGNOSIS — M810 Age-related osteoporosis without current pathological fracture: Secondary | ICD-10-CM | POA: Diagnosis present

## 2016-02-08 DIAGNOSIS — E876 Hypokalemia: Secondary | ICD-10-CM | POA: Diagnosis present

## 2016-02-08 DIAGNOSIS — I480 Paroxysmal atrial fibrillation: Secondary | ICD-10-CM | POA: Diagnosis present

## 2016-02-08 DIAGNOSIS — F0390 Unspecified dementia without behavioral disturbance: Secondary | ICD-10-CM | POA: Diagnosis not present

## 2016-02-08 DIAGNOSIS — G40909 Epilepsy, unspecified, not intractable, without status epilepticus: Secondary | ICD-10-CM | POA: Diagnosis present

## 2016-02-08 DIAGNOSIS — E785 Hyperlipidemia, unspecified: Secondary | ICD-10-CM | POA: Diagnosis present

## 2016-02-08 DIAGNOSIS — H4720 Unspecified optic atrophy: Secondary | ICD-10-CM | POA: Diagnosis present

## 2016-02-08 DIAGNOSIS — E039 Hypothyroidism, unspecified: Secondary | ICD-10-CM | POA: Diagnosis present

## 2016-02-08 DIAGNOSIS — F72 Severe intellectual disabilities: Secondary | ICD-10-CM | POA: Diagnosis present

## 2016-02-08 DIAGNOSIS — Z833 Family history of diabetes mellitus: Secondary | ICD-10-CM

## 2016-02-08 DIAGNOSIS — E872 Acidosis: Secondary | ICD-10-CM | POA: Diagnosis present

## 2016-02-08 DIAGNOSIS — E034 Atrophy of thyroid (acquired): Secondary | ICD-10-CM | POA: Diagnosis not present

## 2016-02-08 DIAGNOSIS — Z66 Do not resuscitate: Secondary | ICD-10-CM | POA: Diagnosis present

## 2016-02-08 DIAGNOSIS — E038 Other specified hypothyroidism: Secondary | ICD-10-CM | POA: Diagnosis not present

## 2016-02-08 DIAGNOSIS — Z79899 Other long term (current) drug therapy: Secondary | ICD-10-CM

## 2016-02-08 DIAGNOSIS — I2510 Atherosclerotic heart disease of native coronary artery without angina pectoris: Secondary | ICD-10-CM | POA: Diagnosis present

## 2016-02-08 DIAGNOSIS — J189 Pneumonia, unspecified organism: Secondary | ICD-10-CM | POA: Diagnosis present

## 2016-02-08 DIAGNOSIS — I5030 Unspecified diastolic (congestive) heart failure: Secondary | ICD-10-CM | POA: Diagnosis not present

## 2016-02-08 HISTORY — DX: Sepsis, unspecified organism: A41.9

## 2016-02-08 LAB — CBC WITH DIFFERENTIAL/PLATELET
BASOS ABS: 0 10*3/uL (ref 0.0–0.1)
BASOS PCT: 0 %
EOS ABS: 0 10*3/uL (ref 0.0–0.7)
Eosinophils Relative: 0 %
HEMATOCRIT: 45.9 % (ref 36.0–46.0)
HEMOGLOBIN: 15.5 g/dL — AB (ref 12.0–15.0)
LYMPHS PCT: 12 %
Lymphs Abs: 2.8 10*3/uL (ref 0.7–4.0)
MCH: 32.2 pg (ref 26.0–34.0)
MCHC: 33.8 g/dL (ref 30.0–36.0)
MCV: 95.4 fL (ref 78.0–100.0)
MONOS PCT: 13 %
Monocytes Absolute: 3 10*3/uL — ABNORMAL HIGH (ref 0.1–1.0)
NEUTROS PCT: 75 %
Neutro Abs: 17.6 10*3/uL — ABNORMAL HIGH (ref 1.7–7.7)
Platelets: 160 10*3/uL (ref 150–400)
RBC: 4.81 MIL/uL (ref 3.87–5.11)
RDW: 14.8 % (ref 11.5–15.5)
WBC: 23.4 10*3/uL — ABNORMAL HIGH (ref 4.0–10.5)

## 2016-02-08 LAB — COMPREHENSIVE METABOLIC PANEL
ALBUMIN: 2.5 g/dL — AB (ref 3.5–5.0)
ALT: 17 U/L (ref 14–54)
AST: 29 U/L (ref 15–41)
Alkaline Phosphatase: 96 U/L (ref 38–126)
Anion gap: 13 (ref 5–15)
BUN: 19 mg/dL (ref 6–20)
CHLORIDE: 100 mmol/L — AB (ref 101–111)
CO2: 26 mmol/L (ref 22–32)
CREATININE: 0.66 mg/dL (ref 0.44–1.00)
Calcium: 9.2 mg/dL (ref 8.9–10.3)
GFR calc Af Amer: >60 mL/min (ref >60)
Glucose, Bld: 98 mg/dL (ref 65–99)
POTASSIUM: 4.6 mmol/L (ref 3.5–5.1)
SODIUM: 139 mmol/L (ref 135–145)
Total Bilirubin: 0.6 mg/dL (ref 0.3–1.2)
Total Protein: 7.4 g/dL (ref 6.5–8.1)

## 2016-02-08 LAB — URINALYSIS, ROUTINE W REFLEX MICROSCOPIC
Bilirubin Urine: NEGATIVE
GLUCOSE, UA: NEGATIVE mg/dL
Hgb urine dipstick: NEGATIVE
Ketones, ur: 15 mg/dL — AB
NITRITE: NEGATIVE
PH: 6 (ref 5.0–8.0)
Protein, ur: NEGATIVE mg/dL
SPECIFIC GRAVITY, URINE: 1.029 (ref 1.005–1.030)

## 2016-02-08 LAB — PROCALCITONIN: Procalcitonin: 0.69 ng/mL

## 2016-02-08 LAB — URINE MICROSCOPIC-ADD ON: RBC / HPF: NONE SEEN RBC/hpf (ref 0–5)

## 2016-02-08 LAB — I-STAT CG4 LACTIC ACID, ED
LACTIC ACID, VENOUS: 3.65 mmol/L — AB (ref 0.5–2.0)
Lactic Acid, Venous: 2.9 mmol/L (ref 0.5–2.0)

## 2016-02-08 LAB — LACTIC ACID, PLASMA
Lactic Acid, Venous: 2.1 mmol/L (ref 0.5–2.0)
Lactic Acid, Venous: 2.1 mmol/L (ref 0.5–2.0)

## 2016-02-08 MED ORDER — ENOXAPARIN SODIUM 40 MG/0.4ML ~~LOC~~ SOLN
40.0000 mg | SUBCUTANEOUS | Status: DC
  Administered 2016-02-08 – 2016-02-13 (×6): 40 mg via SUBCUTANEOUS
  Filled 2016-02-08 (×6): qty 0.4

## 2016-02-08 MED ORDER — ACETAMINOPHEN 650 MG RE SUPP: 650.0000 mg | Freq: Four times a day (QID) | RECTAL | Status: DC | PRN

## 2016-02-08 MED ORDER — PIPERACILLIN-TAZOBACTAM 3.375 G IVPB 30 MIN
3.3750 g | Freq: Once | INTRAVENOUS | Status: AC
  Administered 2016-02-08: 3.375 g via INTRAVENOUS
  Filled 2016-02-08: qty 50

## 2016-02-08 MED ORDER — CLONAZEPAM 0.5 MG PO TABS
0.5000 mg | ORAL_TABLET | Freq: Four times a day (QID) | ORAL | Status: DC
  Administered 2016-02-08 – 2016-02-14 (×22): 0.5 mg
  Filled 2016-02-08 (×22): qty 1

## 2016-02-08 MED ORDER — VALPROATE SODIUM 250 MG/5ML PO SOLN
750.0000 mg | Freq: Three times a day (TID) | ORAL | Status: DC
  Administered 2016-02-08 – 2016-02-14 (×17): 750 mg
  Filled 2016-02-08 (×19): qty 15

## 2016-02-08 MED ORDER — DOCUSATE SODIUM 50 MG/5ML PO LIQD
200.0000 mg | Freq: Two times a day (BID) | ORAL | Status: DC
  Administered 2016-02-08 – 2016-02-14 (×12): 200 mg
  Filled 2016-02-08 (×12): qty 20

## 2016-02-08 MED ORDER — FAMOTIDINE 20 MG PO TABS
20.0000 mg | ORAL_TABLET | Freq: Every day | ORAL | Status: DC
  Administered 2016-02-08 – 2016-02-14 (×7): 20 mg
  Filled 2016-02-08 (×7): qty 1

## 2016-02-08 MED ORDER — LEVOTHYROXINE SODIUM 100 MCG PO TABS
100.0000 ug | ORAL_TABLET | Freq: Every day | ORAL | Status: DC
  Administered 2016-02-09 – 2016-02-14 (×6): 100 ug
  Filled 2016-02-08: qty 1
  Filled 2016-02-08: qty 2
  Filled 2016-02-08 (×4): qty 1

## 2016-02-08 MED ORDER — LEVETIRACETAM 100 MG/ML PO SOLN
750.0000 mg | ORAL | Status: DC
  Administered 2016-02-08 – 2016-02-13 (×6): 750 mg
  Filled 2016-02-08 (×6): qty 7.5

## 2016-02-08 MED ORDER — VALPROIC ACID 250 MG/5ML PO SYRP: 750.0000 mg | ORAL_SOLUTION | Freq: Three times a day (TID) | ORAL | Status: DC

## 2016-02-08 MED ORDER — METOPROLOL TARTRATE 25 MG/10 ML ORAL SUSPENSION
12.5000 mg | Freq: Three times a day (TID) | ORAL | Status: DC
Start: 2016-02-08 — End: 2016-02-14
  Administered 2016-02-08 – 2016-02-14 (×17): 12.5 mg via ORAL
  Filled 2016-02-08 (×18): qty 5

## 2016-02-08 MED ORDER — ACETAMINOPHEN 325 MG PO TABS
650.0000 mg | ORAL_TABLET | Freq: Four times a day (QID) | ORAL | Status: DC | PRN
  Administered 2016-02-09: 650 mg via ORAL
  Filled 2016-02-08: qty 2

## 2016-02-08 MED ORDER — SODIUM CHLORIDE 0.9 % IV BOLUS (SEPSIS)
1000.0000 mL | INTRAVENOUS | Status: AC
  Administered 2016-02-08 (×2): 1000 mL via INTRAVENOUS

## 2016-02-08 MED ORDER — SODIUM CHLORIDE 0.9 % IV SOLN
1250.0000 mg | INTRAVENOUS | Status: DC
  Filled 2016-02-08: qty 1250

## 2016-02-08 MED ORDER — LEVETIRACETAM 100 MG/ML PO SOLN
500.0000 mg | Freq: Every day | ORAL | Status: DC
  Administered 2016-02-09 – 2016-02-14 (×6): 500 mg
  Filled 2016-02-08 (×6): qty 5

## 2016-02-08 MED ORDER — SODIUM CHLORIDE 0.9 % IV SOLN
INTRAVENOUS | Status: DC
  Administered 2016-02-08 – 2016-02-09 (×2): via INTRAVENOUS

## 2016-02-08 MED ORDER — ONDANSETRON HCL 4 MG PO TABS: 4.0000 mg | ORAL_TABLET | Freq: Four times a day (QID) | ORAL | Status: DC | PRN

## 2016-02-08 MED ORDER — METOPROLOL TARTRATE 12.5 MG HALF TABLET
12.5000 mg | ORAL_TABLET | Freq: Three times a day (TID) | ORAL | Status: DC
  Filled 2016-02-08: qty 1

## 2016-02-08 MED ORDER — PIPERACILLIN-TAZOBACTAM 3.375 G IVPB
3.3750 g | Freq: Three times a day (TID) | INTRAVENOUS | Status: DC
  Administered 2016-02-08 – 2016-02-13 (×14): 3.375 g via INTRAVENOUS
  Filled 2016-02-08 (×18): qty 50

## 2016-02-08 MED ORDER — ONDANSETRON HCL 4 MG/2ML IJ SOLN: 4.0000 mg | Freq: Four times a day (QID) | INTRAMUSCULAR | Status: DC | PRN

## 2016-02-08 MED ORDER — SODIUM CHLORIDE 0.9% FLUSH
3.0000 mL | Freq: Two times a day (BID) | INTRAVENOUS | Status: DC
  Administered 2016-02-08 – 2016-02-14 (×10): 3 mL via INTRAVENOUS

## 2016-02-08 MED ORDER — VANCOMYCIN HCL 10 G IV SOLR
1500.0000 mg | Freq: Once | INTRAVENOUS | Status: AC
  Administered 2016-02-08: 1500 mg via INTRAVENOUS
  Filled 2016-02-08: qty 1500

## 2016-02-08 NOTE — Progress Notes (Signed)
Pharmacy Code Sepsis Protocol  Time of code sepsis page: [id number] []  Antibiotics delivered at 1106  Were antibiotics ordered at the time of the code sepsis page? Yes Was it required to contact the physician? [x]  Physician not contacted []  Physician contacted to order antibiotics for code sepsis []  Physician contacted to recommend changing antibiotics  Pharmacy consulted for: vanc/zosyn  Anti-infectives    Start     Dose/Rate Route Frequency Ordered Stop   02/09/16 1100  vancomycin (VANCOCIN) 1,250 mg in sodium chloride 0.9 % 250 mL IVPB     1,250 mg 166.7 mL/hr over 90 Minutes Intravenous Every 24 hours 02/08/16 1112     02/08/16 1900  piperacillin-tazobactam (ZOSYN) IVPB 3.375 g     3.375 g 12.5 mL/hr over 240 Minutes Intravenous Every 8 hours 02/08/16 1112     02/08/16 1115  piperacillin-tazobactam (ZOSYN) IVPB 3.375 g     3.375 g 100 mL/hr over 30 Minutes Intravenous  Once 02/08/16 1102     02/08/16 1115  vancomycin (VANCOCIN) 1,500 mg in sodium chloride 0.9 % 500 mL IVPB     1,500 mg 250 mL/hr over 120 Minutes Intravenous  Once 02/08/16 1102          Nurse education provided: [x]  Minutes left to administer antibiotics to achieve 1 hour goal [x]  Correct order of antibiotic administration [x]  Antibiotic Y-site compatibilities    Elicia Lamp, PharmD, BCPS Clinical Pharmacist Pager [phone] 02/08/2016 11:13 AM

## 2016-02-08 NOTE — ED Notes (Signed)
Per EMS - pt from [facility]. Pt baseline is nonverbal w/ some extremity movement and opening of eyes. Facility reports increased lethargy, no movement of extremities or eye opening. WBC 26.6. Initially hypotensive (90 palp), last 115/68. Hr 109-110. Rep 22. cbg 163

## 2016-02-08 NOTE — H&P (Signed)
History and Physical    Dawn [name] [id number] DOB: [birth year] DOA: 02/08/2016  Referring MD/NP/PA:  ED - Shary Decamp, P.A PCP: Hennie Duos, MD  Outpatient Specialists:  Patient coming from: Nursing Home via EMS  Chief Complaint: mental status changes  HPI: Dawn [name] is a 70 y.o. female with medical history significant for, but not limited to dementia, severe mental retardation, seizures, hypothyroidism, PEG, diastolic heart failure,  and hyperlipidemia. Nursing Home concerned about change in mental status with lethargy. No family in room to obtain additional history and patient unable to provide any herself.   ED Course:  Temp 100.9, heart rate max 109, BP stable, O2 sats normal on room air WBC 20 3.4, hemoglobin 15.5, lactic acid 3.6.  Urinalysis with rare bacteria, small leukocytes, 6-30 WBCs.  Urine and blood cultures pending.  Review of Systems: Unable to obtain. Patient with mental retardation, dementia  Past Medical History  Diagnosis Date  . Seizures ([place])   . Osteoporosis   . Thyroid disease     hypothyroid  . High cholesterol   . Cataracts, bilateral   . Optic atrophy   . Paraparesis (HCC)     mild  . Hypothyroidism   . Coronary artery disease   . CHF (congestive heart failure) ([place])   . Paroxysmal atrial fibrillation (HCC)   . Unspecified convulsions ([place])   . Dementia   . Optic atrophy   . Hypotension   . Anemia   . Hyperlipidemia   . Cataract   . Dental caries   . Esophageal reflux     Past Surgical History  Procedure Laterality Date  . Midline incision    . Central venous catheter insertion  05/15/2014       . Esophagogastroduodenoscopy N/A 06/03/2014    Procedure: ESOPHAGOGASTRODUODENOSCOPY (EGD);  Surgeon: Beryle Beams, MD;  Location[facility] ENDOSCOPY;  Service: Endoscopy;  Laterality: N/A;    Soc Hx: unobtainable today. Per Epic: reports that she has never smoked. She has never used smokeless tobacco. She reports that she  does not drink alcohol or use illicit drugs.  Allergies  Allergen Reactions  . Carvedilol   . Ppd [Tuberculin Purified Protein Derivative]     Per MAR    Family History  Problem Relation Age of Onset  . Colon cancer Mother   . Diabetes type II Sister     Prior to Admission medications   Medication Sig Start Date End Date Taking? Authorizing Provider  clonazePAM (KLONOPIN) 0.5 MG tablet Give 1 tablet vie peg tube every 6 hours for cerebral palsy   Yes Historical Provider, MD  docusate (COLACE) 50 MG/5ML liquid Place 20 mLs (200 mg total) into feeding tube 2 (two) times daily. 06/09/14  Yes Bonnielee Haff, MD  famotidine (PEPCID) 20 MG tablet Give 1 tablet via peg tube daily ( 20 mg )   Yes Historical Provider, MD  furosemide (LASIX) 20 MG tablet 20 mg by PEG Tube route 2 (two) times daily.   Yes Historical Provider, MD  levETIRAcetam (KEPPRA) 100 MG/ML solution Place 5 mLs (500 mg total) into feeding tube daily at 6 (six) AM. 06/09/14  Yes Bonnielee Haff, MD  levETIRAcetam (KEPPRA) 100 MG/ML solution Place 750 mg into feeding tube daily. 2 p and 8 p   Yes Historical Provider, MD  levothyroxine (SYNTHROID, LEVOTHROID) 100 MCG tablet 100 mcg by PEG Tube route daily before breakfast.    Yes Historical Provider, MD  levothyroxine (SYNTHROID, LEVOTHROID) 100 MCG tablet 100  mcg by PEG Tube route daily before breakfast.   Yes Historical Provider, MD  metoprolol tartrate (LOPRESSOR) 25 MG tablet 12.5 mg by PEG Tube route 3 (three) times daily.   Yes Historical Provider, MD  Multiple Vitamins-Minerals (CENTAMIN) LIQD Place 5 mLs into feeding tube daily.   Yes Historical Provider, MD  potassium chloride 20 MEQ/15ML (10%) SOLN 20 mEq by PEG Tube route daily. Dilute with water before administered.   Yes Historical Provider, MD  Valproic Acid (DEPAKENE) 250 MG/5ML SYRP syrup Place 15 mLs (750 mg total) into feeding tube 3 (three) times daily. 06/09/14  Yes Bonnielee Haff, MD  furosemide (LASIX) 10 MG/ML  solution Place 20 mg into feeding tube 2 (two) times daily.    Historical Provider, MD  metoprolol tartrate (LOPRESSOR) 25 mg/10 mL SUSP Take 12.5 mg by mouth 3 (three) times daily.    Historical Provider, MD    Physical Exam: Filed Vitals:   02/08/16 0943 02/08/16 1000 02/08/16 1036 02/08/16 1100  BP: 131/74 117/83 120/88 126/84  Pulse: 102 109 107 99  Temp: 98.3 F (36.8 C)  100.9 F (38.3 C)   TempSrc: Oral  Rectal   Resp: 19 20 20 21   SpO2: 97% 96% 96% 97%    Constitutional: NAD, calm, comfortable Filed Vitals:   02/08/16 0943 02/08/16 1000 02/08/16 1036 02/08/16 1100  BP: 131/74 117/83 120/88 126/84  Pulse: 102 109 107 99  Temp: 98.3 F (36.8 C)  100.9 F (38.3 C)   TempSrc: Oral  Rectal   Resp: 19 20 20 21   SpO2: 97% 96% 96% 97%   Eyes: Cannot assess pupils (closes eyes tightly), lids and conjunctivae normal ENMT: Mucous membranes - can't assess, will not open mouth. Poor dentition..  Neck: supple, no masses Respiratory: difficult exam due to patient's inability to participate. A few bibasilar inspiratory crackles. Normal respiratory effort. No accessory muscle use.  Cardiovascular: Regular rate and rhythm, + murmurs. No rubs / gallops. No extremity edema, +pedal pulses.   Abdomen: No tenderness elicited. No masses palpated. No obvious hepatosplenomegaly. Hypoactive bowel sounds.  PEG site examined and looks healthy. No drainage, erythema.  Musculoskeletal: no clubbing / cyanosis. Both leg firmly bent at knees. Poor ROM RUE, good ROM LUE.   Skin: no rashes, lesions, ulcers.  Neurologic: Non-verbal, doesn't follow commands. Alert, eyes moving around but no eye contact.   Psychiatric: Unable to assess.    Labs on Admission: I have personally reviewed following labs and imaging studies  CBC:  Recent Labs Lab 02/08/16 1021  WBC 23.4*  NEUTROABS 17.6*  HGB 15.5*  HCT 45.9  MCV 95.4  PLT 0000000   Basic Metabolic Panel:  Recent Labs Lab 02/08/16 1021  NA 139    K 4.6  CL 100*  CO2 26  GLUCOSE 98  BUN 19  CREATININE 0.66  CALCIUM 9.2    Liver Function Tests:  Recent Labs Lab 02/08/16 1021  AST 29  ALT 17  ALKPHOS 96  BILITOT 0.6  PROT 7.4  ALBUMIN 2.5*   Urine analysis:    Component Value Date/Time   COLORURINE AMBER* 02/08/2016 [place] 02/08/2016 0955   LABSPEC 1.029 02/08/2016 [place] 6.0 02/08/2016 [place] 02/08/2016 [place] 02/08/2016 [place] 02/08/2016 0955   KETONESUR 15* 02/08/2016 [place] 02/08/2016 0955   UROBILINOGEN 1.0 11/02/2014 0924   NITRITE NEGATIVE 02/08/2016 0955   LEUKOCYTESUR SMALL* 02/08/2016  [place] on Admission: Dg Chest Port 1 View  02/08/2016  CLINICAL DATA:  Sepsis. EXAM: PORTABLE CHEST 1 VIEW COMPARISON:  11/11/2014 FINDINGS: There are very low lung volumes. Improved aeration at the lung bases. There continues to be some hazy densities at the left lung base. A gas in the stomach. Multiple surgical clips in right abdomen. Heart size is grossly stable but poorly characterized due to patient rotation and low lung volumes. IMPRESSION: Low lung volumes but improved aeration in both lungs. There continues to be some hazy densities at the left lung base. Electronically Signed   By: Markus Daft M.D.   On: 02/08/2016 11:22    EKG: Independently reviewed.  EKG Interpretation  Date/Time:  Wednesday February 08 2016 09:41:42 EDT Ventricular Rate:  101 PR Interval:  111 QRS Duration: 75 QT Interval:  323 QTC Calculation: 419 R Axis:   18 Text Interpretation:  Sinus tachycardia Abnormal R-wave progression, early transition Borderline ST depression, lateral leads Since previous tracing Poor R wave progression is new, ST changes are new Confirmed by [facility]  MD, MARTHA [phone]) on 02/08/2016 10:48:32 AM     Assessment/Plan  Sepsis ([place]), possibly secondary to LLL pna vrs UTI and manifested by  tachycardia, leukocytosis, elevated lactic acid. Hemodynamically stable.  -admit to Medical bed - telemetry -Sepsis order set utilized -follow up on blood and urine cultures -cycle lactic acid -Continue Vanco and Zosyn since no definitve source of sepsis yet known. Narrow spectrum as soon as feasible -IV fluid resuscitation in progress in ED -I &0  PAF, HR max 108 today. Suspect rate will improve with IVF. Chadsvasc 3..  -continue home Lopressor 12.5 mg 3 times a day via PEG -monitor on Telemetry   Hx of diastolic heart failure NYHA II. No evidence for decompensation. Echo Jan 2015 with Grade 2 diastolic dysfunction and  EF of 60-65% -continue beta blocker via PEG.  -holding lasix for now - getting IVF for sepsis. As sepsis improves will need to resume diuretic. -Monitor I&0.  Mental retardation / dementia. Patient non-verbal, not following commands. Nursing Home sent her to ED for increased lethargy.  -Nutrition consult for continuation of enteral feedings.   Seizure disorder, controlled. Followed by [facility] -continue home 3 times a day Keppra as well as Depakote.  Hypothyroidism, stable. TSH 3.04 in mid March.  -continue home Thyroid med via PEG    DVT prophylaxis: Lovenox Code Status: DNR / DNI Family Communication: Dr. Marily Memos spoke with patient's POA at bedside ( ? [name]) who understands plan of treatment.  Disposition Plan:  Discharge back to Nursing Home expected in 2-3 days Consults called: None Admission status: Admission to Medical bed - telemetry   Tye Savoy NP [facility] Pager [place]  If 7PM-7AM, please contact night-coverage [email or web address] Password [facility]  02/08/2016, 11:49 AM

## 2016-02-08 NOTE — ED Provider Notes (Signed)
CSN: [id number]     Arrival date & time 02/08/16  [id number] History   First MD Initiated Contact with Patient 02/08/16 [phone]     Chief Complaint  Patient presents with  . Altered Mental Status   (Consider location/radiation/quality/duration/timing/severity/associated sxs/prior Treatment) HPI  70 y.o. female with a hx of Seziures, bilateral cataracts, CHF, dementia, presents to the Emergency Department today from [facility] due to decrease in baseline. Baseline is nonverbal with some movement of extremities and opening of eyes. Facility reported increased lethargy and systolic BP of 0000000 palp.   Level V Caveat: Nonverbal Baseline   Past Medical History  Diagnosis Date  . Seizures ([place])   . Osteoporosis   . Thyroid disease     hypothyroid  . High cholesterol   . Cataracts, bilateral   . Optic atrophy   . Paraparesis (HCC)     mild  . Hypothyroidism   . Anginal pain ([place])   . Coronary artery disease   . CHF (congestive heart failure) ([place])   . Paroxysmal atrial fibrillation (HCC)   . Dysphagia   . Unspecified convulsions ([place])   . Dementia   . Optic atrophy   . Hypotension   . Anemia   . Hyperlipidemia   . Cataract   . Dental caries   . Esophageal reflux    Past Surgical History  Procedure Laterality Date  . Midline incision    . Central venous catheter insertion  05/15/2014       . Esophagogastroduodenoscopy N/A 06/03/2014    Procedure: ESOPHAGOGASTRODUODENOSCOPY (EGD);  Surgeon: Beryle Beams, MD;  Location: [facility] ENDOSCOPY;  Service: Endoscopy;  Laterality: N/A;   Family History  Problem Relation Age of Onset  . Colon cancer Mother   . Diabetes type II Sister    Social History  Substance Use Topics  . Smoking status: Never Smoker   . Smokeless tobacco: Never Used  . Alcohol Use: No   OB History    Gravida Para Term Preterm AB TAB SAB Ectopic Multiple Living   0              Review of Systems  Unable to perform ROS: Patient nonverbal   Allergies   Carvedilol and Ppd  Home Medications   Prior to Admission medications   Medication Sig Start Date End Date Taking? Authorizing Provider  clonazePAM (KLONOPIN) 0.5 MG tablet Give 1 tablet vie peg tube every 6 hours for cerebral palsy    Historical Provider, MD  docusate (COLACE) 50 MG/5ML liquid Place 20 mLs (200 mg total) into feeding tube 2 (two) times daily. 06/09/14   Bonnielee Haff, MD  famotidine (PEPCID) 20 MG tablet Give 1 tablet via peg tube daily ( 20 mg )    Historical Provider, MD  furosemide (LASIX) 10 MG/ML solution Place 20 mg into feeding tube 2 (two) times daily.    Historical Provider, MD  levETIRAcetam (KEPPRA) 100 MG/ML solution Place 5 mLs (500 mg total) into feeding tube daily at 6 (six) AM. 06/09/14   Bonnielee Haff, MD  levETIRAcetam (KEPPRA) 100 MG/ML solution Place 750 mg into feeding tube daily. 2 p and 8 p    Historical Provider, MD  levothyroxine (SYNTHROID, LEVOTHROID) 100 MCG tablet 100 mcg by PEG Tube route daily before breakfast.     Historical Provider, MD  metoprolol tartrate (LOPRESSOR) 25 mg/10 mL SUSP Take 12.5 mg by mouth 3 (three) times daily.    Historical Provider, MD  Multiple Vitamins-Minerals (CENTAMIN)  LIQD Place 5 mLs into feeding tube daily.    Historical Provider, MD  potassium chloride 20 MEQ/15ML (10%) SOLN 20 mEq by PEG Tube route daily. Dilute with water before administered.    Historical Provider, MD  Valproic Acid (DEPAKENE) 250 MG/5ML SYRP syrup Place 15 mLs (750 mg total) into feeding tube 3 (three) times daily. 06/09/14   Bonnielee Haff, MD   BP 131/74 mmHg  Pulse 102  Temp(Src) 98.3 F (36.8 C) (Oral)  Resp 19  SpO2 97%   Physical Exam  Constitutional: She appears well-developed and well-nourished.  HENT:  Head: Normocephalic and atraumatic.  Right Ear: Tympanic membrane and ear canal normal.  Left Ear: Tympanic membrane and ear canal normal.  Nose: Nose normal.  Mouth/Throat: Uvula is midline, oropharynx is clear and moist and  mucous membranes are normal.  Eyes: EOM are normal.  Neck: Normal range of motion.  Cardiovascular: Normal rate, regular rhythm, intact distal pulses and normal pulses.   Murmur heard. Pulmonary/Chest: Effort normal and breath sounds normal. No respiratory distress. She has no wheezes. She has no rales. She exhibits no tenderness.  Abdominal: Soft. Bowel sounds are normal. There is no tenderness.  GTube in place with no visible signs of erythema   Musculoskeletal: Normal range of motion.  Neurological: She is alert.  Non communicative. Pt eyes open. Responds to painful stimuli.    Skin: Skin is warm and dry.  Psychiatric: She has a normal mood and affect. Her behavior is normal. Thought content normal.  Nursing note and vitals reviewed.  ED Course  Procedures (including critical care time) Labs Review Labs Reviewed  COMPREHENSIVE METABOLIC PANEL - Abnormal; Notable for the following:    Chloride 100 (*)    Albumin 2.5 (*)    All other components within normal limits  CBC WITH DIFFERENTIAL/PLATELET - Abnormal; Notable for the following:    WBC 23.4 (*)    Hemoglobin 15.5 (*)    Neutro Abs 17.6 (*)    Monocytes Absolute 3.0 (*)    All other components within normal limits  URINALYSIS, ROUTINE W REFLEX MICROSCOPIC (NOT AT [facility]) - Abnormal; Notable for the following:    Color, Urine AMBER (*)    Ketones, ur 15 (*)    Leukocytes, UA SMALL (*)    All other components within normal limits  URINE MICROSCOPIC-ADD ON - Abnormal; Notable for the following:    Squamous Epithelial / LPF 0-5 (*)    Bacteria, UA RARE (*)    All other components within normal limits  I-STAT CG4 LACTIC ACID, ED - Abnormal; Notable for the following:    Lactic Acid, Venous 3.65 (*)    All other components within normal limits  CULTURE, BLOOD (ROUTINE X 2)  CULTURE, BLOOD (ROUTINE X 2)  URINE CULTURE  I-STAT CG4 LACTIC ACID, ED   Imaging Review Dg Chest Port 1 View  02/08/2016  CLINICAL DATA:  Sepsis.  EXAM: PORTABLE CHEST 1 VIEW COMPARISON:  11/11/2014 FINDINGS: There are very low lung volumes. Improved aeration at the lung bases. There continues to be some hazy densities at the left lung base. A gas in the stomach. Multiple surgical clips in right abdomen. Heart size is grossly stable but poorly characterized due to patient rotation and low lung volumes. IMPRESSION: Low lung volumes but improved aeration in both lungs. There continues to be some hazy densities at the left lung base. Electronically Signed   By: Markus Daft M.D.   On: 02/08/2016 11:22  I have personally reviewed and evaluated these images and lab results as part of my medical decision-making.   EKG Interpretation   Date/Time:  Wednesday February 08 2016 09:41:42 EDT Ventricular Rate:  101 PR Interval:  111 QRS Duration: 75 QT Interval:  323 QTC Calculation: 419 R Axis:   18 Text Interpretation:  Sinus tachycardia Abnormal R-wave progression, early  transition Borderline ST depression, lateral leads Since previous tracing  Poor R wave progression is new, ST changes are new Confirmed by Canary Brim   MD, MARTHA [phone]) on 02/08/2016 10:48:32 AM      MDM  I have reviewed and evaluated the relevant laboratory valuesI have reviewed and evaluated the relevant imaging studies. I have interpreted the relevant EKG.I have reviewed the relevant previous healthcare records.I have reviewed EMS Documentation.I obtained HPI from historian. Patient discussed with supervising physician  ED Course:  Assessment: Pt is a 51yF with hx Seziures, bilateral cataracts, CHF, dementia, non communicative at baseline, vegetative state who presents due altered mental status. On exam, pt in Nontoxic/nonseptic appearing. VS show tachycardia. Febrile 100.9 Rectal. Lungs CTA. Abdomen nontender soft. iStat Lactate 3.65. WBC 23.4. Code Sepsis initiated. Made NPO. Given Vanc/Zosyn to cover unknown source. NS given as per Sepsis protocol.  CXR with hazy densities  left lung base. Possible PNA. UA unremarkable. Plan is to Admit to medicine.   Disposition/Plan:  Admit Supervising Physician Alfonzo Beers, MD   Final diagnoses:  Leukocytosis  Left lower lobe pneumonia       Shary Decamp, PA-C 02/08/16 [place], MD 02/08/16 [phone]

## 2016-02-08 NOTE — Progress Notes (Addendum)
Pharmacy Antibiotic Note  RILLIE ODE is a 70 y.o. female admitted on 02/08/2016 with sepsis.  Pharmacy has been consulted for vanc/zosyn dosing. Code sepsis called. Tmax/24h 100.9, wbc 23.4, LA 3.65. SCr 0.66 on admit, CrCl~52.  Plan: Zosyn 59min inf x1; 3.375g IV q8h Vanc 1500mg  IV x1; 1250mg  IV q24h Monitor clinical progress, c/s, renal function, abx plan/LOT VT@SS  as indicated     Temp (24hrs), Avg:99.6 F (37.6 C), Min:98.3 F (36.8 C), Max:100.9 F (38.3 C)   Recent Labs Lab 02/08/16 1021 02/08/16 1029  WBC 23.4*  --   LATICACIDVEN  --  3.65*    CrCl cannot be calculated (Unknown ideal weight.).    Allergies  Allergen Reactions  . Carvedilol   . Ppd [Tuberculin Purified Protein Derivative]     Per MAR    Antimicrobials this admission: 4/26 vanc >>  4/26 zosyn >>   Dose adjustments this admission: n/a  Microbiology results: 4/26 BCx:  4/26 UCx:     Elicia Lamp, PharmD, BCPS Clinical Pharmacist Pager [phone] 02/08/2016 11:07 AM

## 2016-02-08 NOTE — Progress Notes (Signed)
CRITICAL VALUE ALERT  Critical value received:  Lactic acid 2.1  Date of notification:  02/08/16  Time of notification:  1930  Critical value read back:Yes.    Nurse who received alert:  Shari Prows., RN  MD notified (1st page):  Tylene Fantasia, NP  Time of first page:  1935  MD notified (2nd page):  Time of second page:  Responding MD:    Time MD responded:

## 2016-02-09 ENCOUNTER — Encounter (HOSPITAL_COMMUNITY): Payer: Self-pay | Admitting: General Practice

## 2016-02-09 DIAGNOSIS — I480 Paroxysmal atrial fibrillation: Secondary | ICD-10-CM

## 2016-02-09 DIAGNOSIS — E038 Other specified hypothyroidism: Secondary | ICD-10-CM

## 2016-02-09 DIAGNOSIS — D72829 Elevated white blood cell count, unspecified: Secondary | ICD-10-CM

## 2016-02-09 LAB — BASIC METABOLIC PANEL
ANION GAP: 10 (ref 5–15)
BUN: 18 mg/dL (ref 6–20)
CHLORIDE: 108 mmol/L (ref 101–111)
CO2: 26 mmol/L (ref 22–32)
Calcium: 8.7 mg/dL — ABNORMAL LOW (ref 8.9–10.3)
Creatinine, Ser: 0.55 mg/dL (ref 0.44–1.00)
GFR calc Af Amer: >60 mL/min (ref >60)
Glucose, Bld: 78 mg/dL (ref 65–99)
POTASSIUM: 4.4 mmol/L (ref 3.5–5.1)
SODIUM: 144 mmol/L (ref 135–145)

## 2016-02-09 LAB — URINE CULTURE: Culture: NO GROWTH

## 2016-02-09 LAB — MRSA PCR SCREENING: MRSA BY PCR: NEGATIVE

## 2016-02-09 LAB — GLUCOSE, CAPILLARY
Glucose-Capillary: 87 mg/dL (ref 65–99)
Glucose-Capillary: 122 mg/dL — ABNORMAL HIGH (ref 65–99)

## 2016-02-09 LAB — CBC
HEMATOCRIT: 38.3 % (ref 36.0–46.0)
HEMOGLOBIN: 12.4 g/dL (ref 12.0–15.0)
MCH: 31 pg (ref 26.0–34.0)
MCHC: 32.4 g/dL (ref 30.0–36.0)
MCV: 95.8 fL (ref 78.0–100.0)
PLATELETS: 160 10*3/uL (ref 150–400)
RBC: 4 MIL/uL (ref 3.87–5.11)
RDW: 14.8 % (ref 11.5–15.5)
WBC: 17.1 10*3/uL — AB (ref 4.0–10.5)

## 2016-02-09 MED ORDER — JEVITY 1.2 CAL PO LIQD: 1000.0000 mL | ORAL | Status: DC

## 2016-02-09 MED ORDER — JEVITY 1.5 CAL/FIBER PO LIQD
1000.0000 mL | ORAL | Status: DC
  Administered 2016-02-09 – 2016-02-13 (×5): 1000 mL
  Filled 2016-02-09 (×7): qty 1000

## 2016-02-09 MED ORDER — VANCOMYCIN HCL IN DEXTROSE 750-5 MG/150ML-% IV SOLN
750.0000 mg | Freq: Two times a day (BID) | INTRAVENOUS | Status: DC
  Administered 2016-02-09 – 2016-02-11 (×5): 750 mg via INTRAVENOUS
  Filled 2016-02-09 (×6): qty 150

## 2016-02-09 MED ORDER — PRO-STAT SUGAR FREE PO LIQD
30.0000 mL | Freq: Every day | ORAL | Status: DC
  Administered 2016-02-09 – 2016-02-14 (×6): 30 mL
  Filled 2016-02-09 (×5): qty 30

## 2016-02-09 MED ORDER — CHLORHEXIDINE GLUCONATE 0.12 % MT SOLN
15.0000 mL | Freq: Two times a day (BID) | OROMUCOSAL | Status: DC
  Administered 2016-02-09 – 2016-02-14 (×10): 15 mL via OROMUCOSAL
  Filled 2016-02-09 (×8): qty 15

## 2016-02-09 MED ORDER — CETYLPYRIDINIUM CHLORIDE 0.05 % MT LIQD
7.0000 mL | Freq: Two times a day (BID) | OROMUCOSAL | Status: DC
  Administered 2016-02-11 – 2016-02-13 (×6): 7 mL via OROMUCOSAL

## 2016-02-09 NOTE — NC FL2 (Signed)
[place] LEVEL OF CARE SCREENING TOOL     IDENTIFICATION  Patient Name: Dawn [name] Birthdate: Aug 25, 1946 Sex: female Admission Date (Current Location): 02/08/2016  [place] and [place] Number:  [name] [id number] [place] and Address:  [facility] [place], [place] [address] [place], [place] [address]      Provider Number: [id number]  Attending Physician Name and Address:  Mendel Corning, MD  Relative Name and Phone Number:  [name] - sister. Phone number [phone]    Current Level of Care: Hospital Recommended Level of Care: [place] Prior Approval Number:    Date Approved/Denied:   PASRR Number:   [id number] B - Eff. 07/30/15   Discharge Plan: SNF    Current Diagnoses: Patient Active Problem List   Diagnosis Date Noted  . PNA (pneumonia) 02/08/2016  . Severe mental retardation 01/10/2015  . Severe dementia 01/10/2015  . Senile debility 01/10/2015  . 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 Acute respiratory failure with hypoxia ([place]) 11/20/2014  . Generalized weakness 11/20/2014  . Dysphagia 11/20/2014  . Dementia without behavioral disturbance 11/20/2014  . Development delay 11/02/2014  . Acute respiratory distress (HCC) 11/02/2014  . Paroxysmal ventricular tachycardia ([place]) 10/30/2014  . HCAP (healthcare-associated pneumonia) 10/26/2014  . Hematoma of right parietal scalp 08/13/2014  . Edema 08/09/2014  . Hematoma 08/05/2014  . Fall at nursing home 08/05/2014  . Leukocytosis 07/26/2014  . Tachycardia 07/11/2014  . Leukocytopenia, unspecified 07/11/2014  . Pneumonia 06/17/2014  . Sepsis ([place]) 06/12/2014  . Facial droop 06/12/2014  . Hypernatremia 06/12/2014  . PEG (percutaneous endoscopic gastrostomy) status ([place]) 06/12/2014  . FUO (fever of unknown origin) 06/02/2014  . Paroxysmal a-fib ([place]) 05/14/2014  . Dysphasia 05/14/2014  . UTI (lower urinary tract infection) 05/14/2014  . Tooth decay  05/14/2014  . SIRS (systemic inflammatory response syndrome) ([place]) 12/15/2013  . Thrombocytopenia ([name]) 10/29/2013  . Hypotension 10/29/2013  . Anemia of chronic disease 10/29/2013  . Diastolic heart failure, NYHA class 2 ([place]) 10/29/2013  . Atrial fibrillation with rapid ventricular response ([place]) 10/28/2013  . Seizure disorder ([place]) 10/28/2013  . Hypothyroidism 10/28/2013  . HLD (hyperlipidemia) 10/28/2013  . Non-ST elevation myocardial infarction (NSTEMI) ([place]) 10/28/2013    Orientation RESPIRATION BLADDER Height & Weight     Self  Normal Incontinent Weight: 186 lb 1.6 oz (84.414 kg) (bed weight was zeroed out prior to admission) Height:  5\' 2"  (157.5 cm)  BEHAVIORAL SYMPTOMS/MOOD NEUROLOGICAL BOWEL NUTRITION STATUS    Convulsions/Seizures Incontinent Feeding tube (Patient has PEG tube)  AMBULATORY STATUS COMMUNICATION OF NEEDS Skin   Extensive Assist Non-Verbally Normal                       Personal Care Assistance Level of Assistance  Bathing, Feeding, Dressing Bathing Assistance: Maximum assistance Feeding assistance: Limited assistance Dressing Assistance: Maximum assistance     Functional Limitations Info  Sight, Hearing, Speech Sight Info: Impaired Hearing Info: Adequate Speech Info: Impaired (Patient is mute)    [place]  Restorative feeding program           Restorative Feeding Program Frequency: PEG Tube        Contractures Contractures Info: Not present    Additional Factors Info  Code Status, Allergies Code Status Info: DNR Allergies Info: Carvedilol, Ppd           Current Medications (02/09/2016):  This is the current hospital active medication list Current Facility-Administered Medications  Medication  Dose Route Frequency Provider Last Rate Last Dose  . 0.9 %  sodium chloride infusion   Intravenous Continuous Willia Craze, NP 75 mL/hr at 02/08/16 1831    . acetaminophen (TYLENOL) tablet 650 mg  650 mg Oral  Q6H PRN Willia Craze, NP       Or  . acetaminophen (TYLENOL) suppository 650 mg  650 mg Rectal Q6H PRN Willia Craze, NP      . clonazePAM Bobbye [place]) tablet 0.5 mg  0.5 mg Per Tube QID Willia Craze, NP   0.5 mg at 02/09/16 0953  . docusate (COLACE) 50 MG/5ML liquid 200 mg  200 mg Per Tube BID Willia Craze, NP   200 mg at 02/09/16 0955  . enoxaparin (LOVENOX) injection 40 mg  40 mg Subcutaneous Q24H Willia Craze, NP   40 mg at 02/08/16 2242  . famotidine (PEPCID) tablet 20 mg  20 mg Per Tube Daily Willia Craze, NP   20 mg at 02/09/16 0953  . feeding supplement (JEVITY 1.5 CAL/FIBER) liquid 1,000 mL  1,000 mL Per Tube Continuous Ripudeep K Rai, MD      . feeding supplement (PRO-STAT SUGAR FREE 64) liquid 30 mL  30 mL Per Tube Daily Ripudeep K Rai, MD      . levETIRAcetam (KEPPRA) 100 MG/ML solution 500 mg  500 mg Per Tube [id number] Willia Craze, NP   500 mg at 02/09/16 [id number]  . levETIRAcetam (KEPPRA) 100 MG/ML solution 750 mg  750 mg Per Tube Q24H Willia Craze, NP   750 mg at 02/08/16 2242  . levothyroxine (SYNTHROID, LEVOTHROID) tablet 100 mcg  100 mcg Per Tube QAC breakfast Willia Craze, NP   100 mcg at 02/09/16 [id number]  . metoprolol tartrate (LOPRESSOR) 25 mg/10 mL oral suspension 12.5 mg  12.5 mg Oral TID Willia Craze, NP   12.5 mg at 02/09/16 0953  . ondansetron (ZOFRAN) tablet 4 mg  4 mg Oral Q6H PRN Willia Craze, NP       Or  . ondansetron [facility]) injection 4 mg  4 mg Intravenous Q6H PRN Willia Craze, NP      . piperacillin-tazobactam (ZOSYN) IVPB 3.375 g  3.375 g Intravenous Q8H Romona Curls, RPH   3.375 g at 02/09/16 0300  . sodium chloride flush (NS) 0.9 % injection 3 mL  3 mL Intravenous Q12H Willia Craze, NP   3 mL at 02/09/16 0956  . Valproate Sodium (DEPAKENE) solution 750 mg  750 mg Per Tube TID Waldemar Dickens, MD   750 mg at 02/09/16 0955  . vancomycin (VANCOCIN) IVPB 750 mg/150 ml premix  750 mg Intravenous Q12H Ripudeep Krystal Eaton, MD          Discharge Medications: Please see discharge summary for a list of discharge medications.  Relevant Imaging Results:  Relevant Lab Results:   Additional Information    Sharlet Salina, Mila Homer, LCSW

## 2016-02-09 NOTE — Progress Notes (Signed)
Pharmacy Antibiotic Note  Dawn [name] is a 70 y.o. female admitted on 02/08/2016 with sepsis.  Pharmacy has been consulted for vanc/zosyn dosing. SCr has trended down today will adjust vancomycin dosing empirically.  Plan: Continue Zosyn 3.375g IV q8h Adjust Vanc to 750mg  IV q12h Monitor clinical progress, c/s, renal function, abx plan/LOT VT@SS  as indicated  Height: 5\' 2"  (157.5 cm) Weight: 186 lb 1.6 oz (84.414 kg) (bed weight was zeroed out prior to admission) IBW/kg (Calculated) : 50.1  Temp (24hrs), Avg:98.7 F (37.1 C), Min:98.5 F (36.9 C), Max:98.9 F (37.2 C)   Recent Labs Lab 02/08/16 1021 02/08/16 1029 02/08/16 1433 02/08/16 1828 02/08/16 2115 02/09/16 0348  WBC 23.4*  --   --   --   --  17.1*  CREATININE 0.66  --   --   --   --  0.55  LATICACIDVEN  --  3.65* 2.90* 2.1* 2.1*  --     Estimated Creatinine Clearance: 66.8 mL/min (by C-G formula based on Cr of 0.55).    Allergies  Allergen Reactions  . Carvedilol   . Ppd [Tuberculin Purified Protein Derivative]     Per MAR    Antimicrobials this admission: 4/26 vanc >>  4/26 zosyn >>   Dose adjustments this admission: 4/27: Adjusting vanc to 750 q 12 h  Microbiology results: 4/26 BCx: sent 4/26 UCx:  Sent 4/26 MRSA PCR neg   Thank you for allowing [place] to participate in this patients care. Jens Som, PharmD Pager: [phone]  02/09/2016 10:43 AM

## 2016-02-09 NOTE — Progress Notes (Addendum)
[facility]                                                                              Patient Demographics  Dawn [name], is a 70 y.o. female, DOB - 01-03-1946, [id number]  Admit date - 02/08/2016   Admitting Physician Waldemar Dickens, MD  Outpatient Primary MD for the patient is Hennie Duos, MD  Outpatient specialists:   LOS - 1  days    Chief Complaint  Patient presents with  . Altered Mental Status       Brief summary  Patient is a 70 year old female with dementia, severe mental retardation, seizures, hypothyroidism, diastolic CHF, hyperlipidemia was brought from nursing home with mental status changes and lethargy. In ED, temp was 100.9, heart rate 109, BP and O2 sats were stable. White count 23.4 , lactic acidosis at the time of admission, pro-calcitonin 0.69. Chest x-ray showed hazy density in the left lung base. UA showed ketones with possible UTI   Assessment & Plan   Sepsis ([place]), possibly secondary to LLL pna vrs UTI, Presenting with tachycardia, leukocytosis, elevated lactic acid. - Continue IV vancomycin and Zosyn, follow cultures, IV fluids  Addendum: Blood cultures 1/2 GPC, already on vanc and zosyn, will follow  Paroxysmal atrial fibrillation - Continue beta blockers    Hx of diastolic heart failure NYHA II. No evidence for decompensation. Echo Jan 2015 with Grade 2 diastolic dysfunction and EF of 60-65% -continue beta blocker via PEG,holding lasix for now - getting IVF for sepsi  Mental retardation / dementia. Patient non-verbal, not following commands. Nursing Home sent her to ED for increased lethargy.  -Nutrition consult for continuation of enteral feedings.   Seizure disorder, controlled. Followed by [facility] -continue home 3 times a day Keppra as well as Depakote.  Hypothyroidism, stable. TSH 3.04 in mid March.  -continue home Thyroid med via PEG   Code Status: DO NOT RESUSCITATE  Family  Communication: No family member at the bedside.   Disposition Plan:   Time Spent in minutes   25 minutes  Procedures  None   Consults   none  DVT Prophylaxis  Lovenox  Medications  Scheduled Meds: . clonazePAM  0.5 mg Per Tube QID  . docusate  200 mg Per Tube BID  . enoxaparin (LOVENOX) injection  40 mg Subcutaneous Q24H  . famotidine  20 mg Per Tube Daily  . feeding supplement (PRO-STAT SUGAR FREE 64)  30 mL Per Tube Daily  . levETIRAcetam  500 mg Per Tube Q0600  . levETIRAcetam  750 mg Per Tube Q24H  . levothyroxine  100 mcg Per Tube QAC breakfast  . metoprolol tartrate  12.5 mg Oral TID  . piperacillin-tazobactam (ZOSYN)  IV  3.375 g Intravenous Q8H  . sodium chloride flush  3 mL Intravenous Q12H  . Valproate Sodium  750 mg Per Tube TID  . vancomycin  750 mg Intravenous Q12H   Continuous Infusions: . sodium chloride 75 mL/hr at 02/08/16 1831  . feeding supplement (JEVITY 1.5 CAL/FIBER)     PRN Meds:.acetaminophen **OR** acetaminophen, ondansetron **OR** ondansetron (ZOFRAN) IV   Antibiotics   Anti-infectives  Start     Dose/Rate Route Frequency Ordered Stop   02/09/16 1130  vancomycin (VANCOCIN) IVPB 750 mg/150 ml premix     750 mg 150 mL/hr over 60 Minutes Intravenous Every 12 hours 02/09/16 1042     02/09/16 1100  vancomycin (VANCOCIN) 1,250 mg in sodium chloride 0.9 % 250 mL IVPB  Status:  Discontinued     1,250 mg 166.7 mL/hr over 90 Minutes Intravenous Every 24 hours 02/08/16 1112 02/09/16 1042   02/08/16 1900  piperacillin-tazobactam (ZOSYN) IVPB 3.375 g     3.375 g 12.5 mL/hr over 240 Minutes Intravenous Every 8 hours 02/08/16 1112     02/08/16 1115  piperacillin-tazobactam (ZOSYN) IVPB 3.375 g     3.375 g 100 mL/hr over 30 Minutes Intravenous  Once 02/08/16 1102 02/08/16 1138   02/08/16 1115  vancomycin (VANCOCIN) 1,500 mg in sodium chloride 0.9 % 500 mL IVPB     1,500 mg 250 mL/hr over 120 Minutes Intravenous  Once 02/08/16 1102 02/08/16 1308          Subjective:   Dawn [name] was seen and examined today. Patient continues to stare and tracks but does not respond to any questions or examination.No acute events overnight.  No fevers or chills.  Objective:   Filed Vitals:   02/08/16 1810 02/08/16 2122 02/09/16 0041 02/09/16 0953  BP: 132/63 99/50 108/39 117/68  Pulse: 97 90 94 99  Temp: 98.9 F (37.2 C) 98.8 F (37.1 C) 98.6 F (37 C)   TempSrc: Oral Oral Oral   Resp: 20 18 16    Height: 5\' 2"  (1.575 m)     Weight: 84.414 kg (186 lb 1.6 oz)     SpO2: 98% 95% 96%     Intake/Output Summary (Last 24 hours) at 02/09/16 1123 Last data filed at 02/09/16 0434  Gross per 24 hour  Intake      0 ml  Output      0 ml  Net      0 ml     Wt Readings from Last 3 Encounters:  02/08/16 84.414 kg (186 lb 1.6 oz)  12/27/15 62.324 kg (137 lb 6.4 oz)  07/30/15 64.411 kg (142 lb)     Exam  General: Alert But confused  HEENT:  PERRLA, EOMI, Anicteric Sclera, mucous membranes moist.   Neck: Supple, no JVD, no masses  CVS: S1 S2 auscultated, no rubs, murmurs or gallops. Regular rate and rhythm.  Respiratory: Clear to auscultation bilaterally, no wheezing, rales or rhonchi  Abdomen: Soft, nontender, nondistended, + bowel sounds, PEG tube  Ext: no cyanosis clubbing or edema, contractures  Neuro: does not cooperate with exam  Skin: No rashes  Psych: alert but confused   Data Reviewed:  I have personally reviewed following labs and imaging studies  Micro Results Recent Results (from the past 240 hour(s))  MRSA PCR Screening     Status: None   Collection Time: 02/08/16 11:09 PM  Result Value Ref Range Status   MRSA by PCR NEGATIVE NEGATIVE Final    Comment:        The GeneXpert MRSA Assay (FDA approved for NASAL specimens only), is one component of a comprehensive MRSA colonization surveillance program. It is not intended to diagnose MRSA infection nor to guide or monitor treatment for MRSA infections.      Radiology Reports Dg Chest Port 1 View  02/08/2016  CLINICAL DATA:  Sepsis. EXAM: PORTABLE CHEST 1 VIEW COMPARISON:  11/11/2014 FINDINGS: There are very low lung  volumes. Improved aeration at the lung bases. There continues to be some hazy densities at the left lung base. A gas in the stomach. Multiple surgical clips in right abdomen. Heart size is grossly stable but poorly characterized due to patient rotation and low lung volumes. IMPRESSION: Low lung volumes but improved aeration in both lungs. There continues to be some hazy densities at the left lung base. Electronically Signed   By: Markus Daft M.D.   On: 02/08/2016 11:22    CBC  Recent Labs Lab 02/08/16 1021 02/09/16 0348  WBC 23.4* 17.1*  HGB 15.5* 12.4  HCT 45.9 38.3  PLT 160 160  MCV 95.4 95.8  MCH 32.2 31.0  MCHC 33.8 32.4  RDW 14.8 14.8  LYMPHSABS 2.8  --   MONOABS 3.0*  --   EOSABS 0.0  --   BASOSABS 0.0  --     Chemistries   Recent Labs Lab 02/08/16 1021 02/09/16 0348  NA 139 144  K 4.6 4.4  CL 100* 108  CO2 26 26  GLUCOSE 98 78  BUN 19 18  CREATININE 0.66 0.55  CALCIUM 9.2 8.7*  AST 29  --   ALT 17  --   ALKPHOS 96  --   BILITOT 0.6  --    ------------------------------------------------------------------------------------------------------------------ estimated creatinine clearance is 66.8 mL/min (by C-G formula based on Cr of 0.55). ------------------------------------------------------------------------------------------------------------------ No results for input(s): HGBA1C in the last 72 hours. ------------------------------------------------------------------------------------------------------------------ No results for input(s): CHOL, HDL, LDLCALC, TRIG, CHOLHDL, LDLDIRECT in the last 72 hours. ------------------------------------------------------------------------------------------------------------------ No results for input(s): TSH, T4TOTAL, T3FREE, THYROIDAB in the last 72  hours.  Invalid input(s): FREET3 ------------------------------------------------------------------------------------------------------------------ No results for input(s): VITAMINB12, FOLATE, FERRITIN, TIBC, IRON, RETICCTPCT in the last 72 hours.  Coagulation profile No results for input(s): INR, PROTIME in the last 168 hours.  No results for input(s): DDIMER in the last 72 hours.  Cardiac Enzymes No results for input(s): CKMB, TROPONINI, MYOGLOBIN in the last 168 hours.  Invalid input(s): CK ------------------------------------------------------------------------------------------------------------------ Invalid input(s): POCBNP  No results for input(s): GLUCAP in the last 72 hours.   Dorean Daniello M.D. [facility] 02/09/2016, 11:23 AM  Pager: [phone] Between 7am to 7pm - call Pager - [phone]  After 7pm go to [email or web address] - password [email or web address]  Call night coverage person covering after 7pm

## 2016-02-09 NOTE — Clinical Social Work Note (Signed)
Clinical Social Work Assessment  Patient Details  Name: Dawn [name] MRN: [id number] Date of Birth: 1946/05/22  Date of referral:  02/09/16               Reason for consult:  Facility Placement                Permission sought to share information with:  Other, Family Supports (Patient alert to self only and is non-verbal. CSW talked with sister, Dawn [name]) Permission granted to share information::  No (Patient alert to self only)  Name::     Dawn [name]  Agency::     Relationship::  Sister  Contact Information:  [phone]  Housing/Transportation Living arrangements for the past 2 months:  [facility] ([place] ) Source of Information:  Other (Comment Required) (Sister and chart review) Patient Interpreter Needed:  None Criminal Activity/Legal Involvement Pertinent to Current Situation/Hospitalization:  No - Comment as needed Significant Relationships:  Siblings (Dawn [name] and Dawn [name] (sisters)) Lives with:  Facility Resident ([place]) Do you feel safe going back to the place where you live?  Yes Need for family participation in patient care:  Yes (Comment)  Care giving concerns:  None expressed by sister Dawn [name]   Social Worker assessment / plan:  CSW contacted Ms. [name] by phone and confirmed that patient from [facility] and will return there at discharge. [place] chart reviewed and patient admitted to [facility]/09/14.  Employment status:  Retired Forensic scientist:  Medicaid In [place], [place] PT Recommendations:    Information / Referral to community resources:  Other (Comment Required) (Patient not evaluated by PT/OT as from a skilled facility)  Patient/Family's Response to care:  No concerns expressed regarding care during hospitalization.  Patient/Family's Understanding of and Emotional Response to Diagnosis, Current Treatment, and Prognosis:  Not discussed.  Emotional Assessment Appearance:  Other (Comment Required  (CSW talked with sister by phone, did not visit with patient.) Attitude/Demeanor/Rapport:  Unable to Assess Affect (typically observed):  Unable to Assess Orientation:  Oriented to Self Alcohol / Substance use:  Never Used Psych involvement (Current and /or in the community):  No (Comment)  Discharge Needs  Concerns to be addressed:  Discharge Planning Concerns Readmission within the last 30 days:  No Current discharge risk:  None Barriers to Discharge:  No Barriers Identified   Sable Feil, LCSW 02/09/2016, 12:03 PM

## 2016-02-09 NOTE — Progress Notes (Signed)
Initial Nutrition Assessment   INTERVENTION:  Initiate Jevity 1.5@ 20 ml/hr via PEG and increase by 10 ml/hr every 4 hours to goal rate of 45 ml/hr. Provide 30 ml of Pro-stat once daily. TF regimen will provide 1720 kcal, 84 grams of protein, and 821 ml of water.    NUTRITION DIAGNOSIS:   Inadequate oral intake related to inability to eat as evidenced by NPO status.   GOAL:   Patient will meet greater than or equal to 90% of their needs   MONITOR:   TF tolerance, Labs, Weight trends, Skin, I & O's  REASON FOR ASSESSMENT:   Consult Enteral/tube feeding initiation and management  ASSESSMENT:   70 y.o. female with medical history significant for, but not limited to dementia, severe mental retardation, seizures, hypothyroidism, PEG, diastolic heart failure, and hyperlipidemia. Nursing Home concerned about change in mental status with lethargy.   Per pt's paper chart from nursing home, pt receives TwoCal HN @ 39 ml/hr via PEG. This provides 1872 kcal, 78 grams of protein and 655 ml of water. Pt's weight is up 49 lbs from last month; pt with only mild edema. Pt usually weighs 137 to 144 lbs per weight history. Pt appears well-nourished per nutrition focused physical exam.   Labs reviewed.   Diet Order:  Diet NPO time specified  Skin:  Reviewed, no issues  Last BM:  unknown  Height:   Ht Readings from Last 1 Encounters:  02/08/16 5\' 2"  (1.575 m)    Weight:   Wt Readings from Last 1 Encounters:  02/08/16 186 lb 1.6 oz (84.414 kg)    Ideal Body Weight:  50 kg  BMI:  Body mass index is 34.03 kg/(m^2).  Estimated Nutritional Needs:   Kcal:  [id number]  Protein:  75-90 grams  Fluid:  1.8 L/day  EDUCATION NEEDS:   No education needs identified at this time  [place], LDN Inpatient Clinical Dietitian Pager: [phone] After Hours Pager: [phone]

## 2016-02-09 NOTE — Progress Notes (Signed)
CRITICAL VALUE ALERT  Critical value received: Blood culture positive  cocci in cluster drawn 02/08/16  Date of notification:  02/09/2016  Time of notification:  1:15 pm  Critical value read back:yes  Nurse who received alert:  Elfredia Nevins Dessirae Scarola RN  MD notified (1st page):  Dr.Rai  Time of first page:  01 :79  MD notified (2nd page):  Time of second page:  Responding MD:  DR. Tana Coast   Time MD responded:  [phone]

## 2016-02-09 NOTE — Care Management Note (Signed)
Case Management Note  Patient Details  Name: KURSTON MINERVINI MRN: [id number] Date of Birth: 1946/04/04  Subjective/Objective:    Pt admitted with altered mental status                Action/Plan:  Pt is from [place] is for pt to return post discharge.  CM will continue to monitor for discharge needs   Expected Discharge Date:                  Expected Discharge Plan:  [place]  In-House Referral:  Clinical Social Work  Discharge planning Services     Post Acute Care Choice:    Choice offered to:     DME Arranged:    DME Agency:     HH Arranged:    [place] Agency:     Status of Service:  In process, will continue to follow  Medicare Important Message Given:    Date Medicare IM Given:    Medicare IM give by:    Date Additional Medicare IM Given:    Additional Medicare Important Message give by:     If discussed at [place] of Stay Meetings, dates discussed:    Additional Comments:  Maryclare Labrador, RN 02/09/2016, 10:12 AM

## 2016-02-10 DIAGNOSIS — A419 Sepsis, unspecified organism: Principal | ICD-10-CM

## 2016-02-10 LAB — GLUCOSE, CAPILLARY
GLUCOSE-CAPILLARY: 126 mg/dL — AB (ref 65–99)
Glucose-Capillary: 120 mg/dL — ABNORMAL HIGH (ref 65–99)
Glucose-Capillary: 86 mg/dL (ref 65–99)
Glucose-Capillary: 181 mg/dL — ABNORMAL HIGH (ref 65–99)
Glucose-Capillary: 124 mg/dL — ABNORMAL HIGH (ref 65–99)
Glucose-Capillary: 133 mg/dL — ABNORMAL HIGH (ref 65–99)

## 2016-02-10 NOTE — Progress Notes (Signed)
Nutrition Follow-up  INTERVENTION:  Continue Jevity 1.5@ 45 ml/hr via PEG  ml/hr with 30 ml of Pro-stat once daily. TF regimen provides 1720 kcal, 84 grams of protein, and 821 ml of water.  Recommend providing 100 ml free water flush every 3 hours to provide an additional 800 ml of fluid daily.  TF regimen plus FWF would provide 1621 ml daily   NUTRITION DIAGNOSIS:   Inadequate oral intake related to inability to eat as evidenced by NPO status.  Ongoing  GOAL:   Patient will meet greater than or equal to 90% of their needs  Being Met  MONITOR:   TF tolerance, Labs, Weight trends, Skin, I & O's  REASON FOR ASSESSMENT:   Consult Enteral/tube feeding initiation and management  ASSESSMENT:   70 y.o. female with medical history significant for, but not limited to dementia, severe mental retardation, seizures, hypothyroidism, PEG, diastolic heart failure, and hyperlipidemia. Nursing Home concerned about change in mental status with lethargy.   Pt resting comfortably at time of visit and Jevity 1.5 infusing via PEG at goal rate of 45 ml/hr; 30 ml free water q 4 hours; IVF at 75 ml/hr. Weight is up 2 lbs from yesterday. +1 RUE, LUE, RLE and LLE edema per nursing notes.   Labs reviewed.   Diet Order:  Diet NPO time specified  Skin:  Reviewed, no issues  Last BM:  unknown  Height:   Ht Readings from Last 1 Encounters:  02/08/16 _0  (1.575 m)    Weight:   Wt Readings from Last 1 Encounters:  02/10/16 188 lb (85.276 kg)    Ideal Body Weight:  50 kg  BMI:  Body mass index is 34.38 kg/(m^2).  Estimated Nutritional Needs:   Kcal:  5859-2924  Protein:  75-90 grams  Fluid:  1.8 L/day  EDUCATION NEEDS:   No education needs identified at this time  [place], LDN Inpatient Clinical Dietitian Pager: [phone] After Hours Pager: [phone]

## 2016-02-10 NOTE — Care Management Important Message (Signed)
Important Message  Patient Details  Name: Dawn [name] MRN: [id number] Date of Birth: Feb 26, 1946   Medicare Important Message Given:  Yes    Maryclare Labrador, RN 02/10/2016, 3:13 PM

## 2016-02-10 NOTE — Progress Notes (Signed)
Pt appears to be following commands when accessing for pain ask pt to squeeze hand if in pain and she did and was given 650 of Tylenol per peg tube when reassessing pain when asked to squeeze hand if in pain she did not. Also when performing mouth care pt would open her mouth to allow swap suction. Will continue to monitor. Arthor Captain LPN

## 2016-02-10 NOTE — Progress Notes (Signed)
[facility]                                                                              Patient Demographics  Dawn [name], is a 70 y.o. female, DOB - Nov 21, 1945, [id number]  Admit date - 02/08/2016   Admitting Physician Waldemar Dickens, MD  Outpatient Primary MD for the patient is Hennie Duos, MD  Outpatient specialists:   LOS - 2  days    Chief Complaint  Patient presents with  . Altered Mental Status       Brief summary  Patient is a 70 year old female with dementia, severe mental retardation, seizures, hypothyroidism, diastolic CHF, hyperlipidemia was brought from nursing home with mental status changes and lethargy. In ED, temp was 100.9, heart rate 109, BP and O2 sats were stable. White count 23.4 , lactic acidosis at the time of admission, pro-calcitonin 0.69. Chest x-ray showed hazy density in the left lung base. UA showed ketones with possible UTI   Assessment & Plan   Sepsis ([place]), possibly secondary to LLL pna, Presenting with tachycardia, leukocytosis, elevated lactic acid. - Continue IV vancomycin and Zosyn, follow cultures, IV fluids  - Blood cultures 1/2 GPC Coagulase negative staph - Urine culture showed no growth - Continue IV vancomycin and Zosyn for now   Paroxysmal atrial fibrillation - Continue beta blockers   Hx of diastolic heart failure NYHA II. No evidence for decompensation. Echo Jan 2015 with Grade 2 diastolic dysfunction and EF of 60-65% - continue beta blocker via PEG,holding lasix for now - Discontinue fluids to avoid fluid overload  Mental retardation / dementia. Patient non-verbal, not following commands. Nursing Home sent her to ED for increased lethargy.  -Nutrition consult for continuation of enteral feedings.   Seizure disorder, controlled. Followed by [facility] -continue home 3 times a day Keppra as well as Depakote.  Hypothyroidism, stable. TSH 3.04 in mid March.  -continue home  Thyroid med via PEG   Code Status: DO NOT RESUSCITATE  Family Communication: No family member at the bedside.   Disposition Plan:   Time Spent in minutes   25 minutes  Procedures  None   Consults   none  DVT Prophylaxis  Lovenox  Medications  Scheduled Meds: . antiseptic oral rinse  7 mL Mouth Rinse q12n4p  . chlorhexidine  15 mL Mouth Rinse BID  . clonazePAM  0.5 mg Per Tube QID  . docusate  200 mg Per Tube BID  . enoxaparin (LOVENOX) injection  40 mg Subcutaneous Q24H  . famotidine  20 mg Per Tube Daily  . feeding supplement (PRO-STAT SUGAR FREE 64)  30 mL Per Tube Daily  . levETIRAcetam  500 mg Per Tube Q0600  . levETIRAcetam  750 mg Per Tube Q24H  . levothyroxine  100 mcg Per Tube QAC breakfast  . metoprolol tartrate  12.5 mg Oral TID  . piperacillin-tazobactam (ZOSYN)  IV  3.375 g Intravenous Q8H  . sodium chloride flush  3 mL Intravenous Q12H  . Valproate Sodium  750 mg Per Tube TID  . vancomycin  750 mg Intravenous Q12H   Continuous Infusions: . sodium chloride  75 mL/hr at 02/09/16 1557  . feeding supplement (JEVITY 1.5 CAL/FIBER) 1,000 mL (02/09/16 1545)   PRN Meds:.acetaminophen **OR** acetaminophen, ondansetron **OR** ondansetron (ZOFRAN) IV   Antibiotics   Anti-infectives    Start     Dose/Rate Route Frequency Ordered Stop   02/09/16 1130  vancomycin (VANCOCIN) IVPB 750 mg/150 ml premix     750 mg 150 mL/hr over 60 Minutes Intravenous Every 12 hours 02/09/16 1042     02/09/16 1100  vancomycin (VANCOCIN) 1,250 mg in sodium chloride 0.9 % 250 mL IVPB  Status:  Discontinued     1,250 mg 166.7 mL/hr over 90 Minutes Intravenous Every 24 hours 02/08/16 1112 02/09/16 1042   02/08/16 1900  piperacillin-tazobactam (ZOSYN) IVPB 3.375 g     3.375 g 12.5 mL/hr over 240 Minutes Intravenous Every 8 hours 02/08/16 1112     02/08/16 1115  piperacillin-tazobactam (ZOSYN) IVPB 3.375 g     3.375 g 100 mL/hr over 30 Minutes Intravenous  Once 02/08/16 1102 02/08/16  1138   02/08/16 1115  vancomycin (VANCOCIN) 1,500 mg in sodium chloride 0.9 % 500 mL IVPB     1,500 mg 250 mL/hr over 120 Minutes Intravenous  Once 02/08/16 1102 02/08/16 1308        Subjective:   Dawn [name] was seen and examined today. Stairs but does not respond to any questions. No fevers or chills. No acute events overnight.   Objective:   Filed Vitals:   02/09/16 1300 02/09/16 2005 02/09/16 2124 02/10/16 0407  BP: 128/63 111/82 115/67 133/66  Pulse: 80 112 97 91  Temp: 97.2 F (36.2 C) 98.3 F (36.8 C) 98.1 F (36.7 C) 97.2 F (36.2 C)  TempSrc: Oral Oral Oral Oral  Resp: 18 18 18 18   Height:      Weight:    85.276 kg (188 lb)  SpO2: 94% 93% 96% 93%    Intake/Output Summary (Last 24 hours) at 02/10/16 1013 Last data filed at 02/10/16 [id number]  Gross per 24 hour  Intake 3496.25 ml  Output    800 ml  Net 2696.25 ml     Wt Readings from Last 3 Encounters:  02/10/16 85.276 kg (188 lb)  12/27/15 62.324 kg (137 lb 6.4 oz)  07/30/15 64.411 kg (142 lb)     Exam  General: Alert But confused  HEENT:    Neck:   CVS: S1 S2 auscultated, no rubs, murmurs or gallops. Regular rate and rhythm.  Respiratory: Clear to auscultation bilaterally Anteriorly  Abdomen: Soft, nontender, nondistended, + bowel sounds, PEG tube  Ext: no cyanosis clubbing or edema, contractures  Neuro: does not cooperate with exam  Skin: No rashes  Psych: alert but confused   Data Reviewed:  I have personally reviewed following labs and imaging studies  Micro Results Recent Results (from the past 240 hour(s))  Urine culture     Status: None   Collection Time: 02/08/16  9:55 AM  Result Value Ref Range Status   Specimen Description URINE, RANDOM  Final   Special Requests NONE  Final   Culture NO GROWTH 1 DAY  Final   Report Status 02/09/2016 FINAL  Final  Culture, blood (Routine x 2)     Status: None (Preliminary result)   Collection Time: 02/08/16 10:19 AM  Result Value Ref  Range Status   Specimen Description BLOOD RIGHT ANTECUBITAL  Final   Special Requests BOTTLES DRAWN AEROBIC ONLY 5CC  Final   Culture NO GROWTH 1 DAY  Final  Report Status PENDING  Incomplete  Culture, blood (Routine x 2)     Status: Abnormal (Preliminary result)   Collection Time: 02/08/16 10:25 AM  Result Value Ref Range Status   Specimen Description BLOOD RIGHT HAND  Final   Special Requests BOTTLES DRAWN AEROBIC ONLY 5CC  Final   Culture  Setup Time   Final    GRAM POSITIVE COCCI IN CLUSTERS AEROBIC BOTTLE ONLY CRITICAL RESULT CALLED TO, READ BACK BY AND VERIFIED WITH: A. NAGBIDANG,RN AT 1253 ON [id number] BY S. YARBROUGH    Culture (A)  Final    STAPHYLOCOCCUS SPECIES (COAGULASE NEGATIVE) THE SIGNIFICANCE OF ISOLATING THIS ORGANISM FROM A SINGLE SET OF BLOOD CULTURES WHEN MULTIPLE SETS ARE DRAWN IS UNCERTAIN. PLEASE NOTIFY THE MICROBIOLOGY DEPARTMENT WITHIN ONE WEEK IF SPECIATION AND SENSITIVITIES ARE REQUIRED.    Report Status PENDING  Incomplete  MRSA PCR Screening     Status: None   Collection Time: 02/08/16 11:09 PM  Result Value Ref Range Status   MRSA by PCR NEGATIVE NEGATIVE Final    Comment:        The GeneXpert MRSA Assay (FDA approved for NASAL specimens only), is one component of a comprehensive MRSA colonization surveillance program. It is not intended to diagnose MRSA infection nor to guide or monitor treatment for MRSA infections.     Radiology Reports Dg Chest Port 1 View  02/08/2016  CLINICAL DATA:  Sepsis. EXAM: PORTABLE CHEST 1 VIEW COMPARISON:  11/11/2014 FINDINGS: There are very low lung volumes. Improved aeration at the lung bases. There continues to be some hazy densities at the left lung base. A gas in the stomach. Multiple surgical clips in right abdomen. Heart size is grossly stable but poorly characterized due to patient rotation and low lung volumes. IMPRESSION: Low lung volumes but improved aeration in both lungs. There continues to be some hazy  densities at the left lung base. Electronically Signed   By: Markus Daft M.D.   On: 02/08/2016 11:22    CBC  Recent Labs Lab 02/08/16 1021 02/09/16 0348  WBC 23.4* 17.1*  HGB 15.5* 12.4  HCT 45.9 38.3  PLT 160 160  MCV 95.4 95.8  MCH 32.2 31.0  MCHC 33.8 32.4  RDW 14.8 14.8  LYMPHSABS 2.8  --   MONOABS 3.0*  --   EOSABS 0.0  --   BASOSABS 0.0  --     Chemistries   Recent Labs Lab 02/08/16 1021 02/09/16 0348  NA 139 144  K 4.6 4.4  CL 100* 108  CO2 26 26  GLUCOSE 98 78  BUN 19 18  CREATININE 0.66 0.55  CALCIUM 9.2 8.7*  AST 29  --   ALT 17  --   ALKPHOS 96  --   BILITOT 0.6  --    ------------------------------------------------------------------------------------------------------------------ estimated creatinine clearance is 67.3 mL/min (by C-G formula based on Cr of 0.55). ------------------------------------------------------------------------------------------------------------------ No results for input(s): HGBA1C in the last 72 hours. ------------------------------------------------------------------------------------------------------------------ No results for input(s): CHOL, HDL, LDLCALC, TRIG, CHOLHDL, LDLDIRECT in the last 72 hours. ------------------------------------------------------------------------------------------------------------------ No results for input(s): TSH, T4TOTAL, T3FREE, THYROIDAB in the last 72 hours.  Invalid input(s): FREET3 ------------------------------------------------------------------------------------------------------------------ No results for input(s): VITAMINB12, FOLATE, FERRITIN, TIBC, IRON, RETICCTPCT in the last 72 hours.  Coagulation profile No results for input(s): INR, PROTIME in the last 168 hours.  No results for input(s): DDIMER in the last 72 hours.  Cardiac Enzymes No results for input(s): CKMB, TROPONINI, MYOGLOBIN in the last 168 hours.  Invalid input(s):  CK ------------------------------------------------------------------------------------------------------------------ Invalid input(s): [place]   Recent Labs  02/09/16 1544 02/09/16 1952 02/10/16 0013 02/10/16 0352 02/10/16 0758  GLUCAP 87 122* 126* 120* 25     Chandrika Sandles M.D. [facility] 02/10/2016, 10:13 AM  Pager: [phone] Between 7am to 7pm - call Pager - [phone]  After 7pm go to [email or web address] - password [email or web address]  Call night coverage person covering after 7pm

## 2016-02-10 NOTE — Progress Notes (Signed)
Hung new bottle of Jevity 1.5 cal; changed and hung new tubing, as well.

## 2016-02-11 DIAGNOSIS — F0390 Unspecified dementia without behavioral disturbance: Secondary | ICD-10-CM

## 2016-02-11 DIAGNOSIS — F72 Severe intellectual disabilities: Secondary | ICD-10-CM

## 2016-02-11 LAB — CULTURE, BLOOD (ROUTINE X 2)

## 2016-02-11 LAB — BASIC METABOLIC PANEL
Anion gap: 13 (ref 5–15)
BUN: 11 mg/dL (ref 6–20)
CO2: 24 mmol/L (ref 22–32)
CREATININE: 0.55 mg/dL (ref 0.44–1.00)
Calcium: 8.3 mg/dL — ABNORMAL LOW (ref 8.9–10.3)
Chloride: 107 mmol/L (ref 101–111)
GFR calc Af Amer: >60 mL/min (ref >60)
Glucose, Bld: 132 mg/dL — ABNORMAL HIGH (ref 65–99)
POTASSIUM: 3.1 mmol/L — AB (ref 3.5–5.1)
SODIUM: 144 mmol/L (ref 135–145)

## 2016-02-11 LAB — CBC
HCT: 32.8 % — ABNORMAL LOW (ref 36.0–46.0)
Hemoglobin: 10.4 g/dL — ABNORMAL LOW (ref 12.0–15.0)
MCH: 30.8 pg (ref 26.0–34.0)
MCHC: 31.7 g/dL (ref 30.0–36.0)
MCV: 97 fL (ref 78.0–100.0)
PLATELETS: 186 10*3/uL (ref 150–400)
RBC: 3.38 MIL/uL — AB (ref 3.87–5.11)
RDW: 14.5 % (ref 11.5–15.5)
WBC: 13.8 10*3/uL — AB (ref 4.0–10.5)

## 2016-02-11 LAB — GLUCOSE, CAPILLARY
GLUCOSE-CAPILLARY: 110 mg/dL — AB (ref 65–99)
GLUCOSE-CAPILLARY: 130 mg/dL — AB (ref 65–99)
GLUCOSE-CAPILLARY: 152 mg/dL — AB (ref 65–99)
GLUCOSE-CAPILLARY: 161 mg/dL — AB (ref 65–99)
Glucose-Capillary: 130 mg/dL — ABNORMAL HIGH (ref 65–99)
Glucose-Capillary: 118 mg/dL — ABNORMAL HIGH (ref 65–99)

## 2016-02-11 LAB — VANCOMYCIN, TROUGH: VANCOMYCIN TR: 13 ug/mL (ref 10.0–20.0)

## 2016-02-11 MED ORDER — VANCOMYCIN HCL IN DEXTROSE 1-5 GM/200ML-% IV SOLN
1000.0000 mg | Freq: Two times a day (BID) | INTRAVENOUS | Status: DC
  Administered 2016-02-11 – 2016-02-13 (×3): 1000 mg via INTRAVENOUS
  Filled 2016-02-11 (×4): qty 200

## 2016-02-11 NOTE — Progress Notes (Signed)
[facility]                                                                              Patient Demographics  Dawn [name], is a 70 y.o. female, DOB - 1946-03-24, [id number]  Admit date - 02/08/2016   Admitting Physician Waldemar Dickens, MD  Outpatient Primary MD for the patient is Hennie Duos, MD  Outpatient specialists:   LOS - 3  days    Chief Complaint  Patient presents with  . Altered Mental Status       Brief summary  Patient is a 70 year old female with dementia, severe mental retardation, seizures, hypothyroidism, diastolic CHF, hyperlipidemia was brought from nursing home with mental status changes and lethargy. In ED, temp was 100.9, heart rate 109, BP and O2 sats were stable. White count 23.4 , lactic acidosis at the time of admission, pro-calcitonin 0.69. Chest x-ray showed hazy density in the left lung base. UA showed ketones with possible UTI   Assessment & Plan   Sepsis ([place]), possibly secondary to LLL pna, Presenting with tachycardia, leukocytosis, elevated lactic acid. - Continue IV vancomycin and Zosyn, follow cultures, IV fluids  - Blood cultures 1/2 GPC Coagulase negative staph - Urine culture showed no growth - Continue IV vancomycin and Zosyn for now   Paroxysmal atrial fibrillation - Continue beta blockers   Hx of diastolic heart failure NYHA II. No evidence for decompensation. Echo Jan 2015 with Grade 2 diastolic dysfunction and EF of 60-65% - continue beta blocker via PEG,holding lasix for now - Discontinued fluids to avoid fluid overload, Tolerating tube feeds  Mental retardation / dementia. Patient non-verbal, not following commands. Nursing Home sent her to ED for increased lethargy.  -Nutrition consult for continuation of enteral feedings. Tolerating tube feeds  Seizure disorder, controlled. Followed by [facility] -continue home 3 times a day Keppra as well as Depakote.  Hypothyroidism, stable.  TSH 3.04 in mid March.  -continue home Thyroid med via PEG   Code Status: DO NOT RESUSCITATE  Family Communication: No family member at the bedside.   Disposition Plan: Hopefully DC to skilled nursing study on Monday  Time Spent in minutes   15 minutes  Procedures  None   Consults   none  DVT Prophylaxis  Lovenox  Medications  Scheduled Meds: . antiseptic oral rinse  7 mL Mouth Rinse q12n4p  . chlorhexidine  15 mL Mouth Rinse BID  . clonazePAM  0.5 mg Per Tube QID  . docusate  200 mg Per Tube BID  . enoxaparin (LOVENOX) injection  40 mg Subcutaneous Q24H  . famotidine  20 mg Per Tube Daily  . feeding supplement (PRO-STAT SUGAR FREE 64)  30 mL Per Tube Daily  . levETIRAcetam  500 mg Per Tube Q0600  . levETIRAcetam  750 mg Per Tube Q24H  . levothyroxine  100 mcg Per Tube QAC breakfast  . metoprolol tartrate  12.5 mg Oral TID  . piperacillin-tazobactam (ZOSYN)  IV  3.375 g Intravenous Q8H  . sodium chloride flush  3 mL Intravenous Q12H  . Valproate Sodium  750 mg Per Tube TID  . vancomycin  750 mg Intravenous Q12H   Continuous Infusions: . feeding supplement (JEVITY 1.5 CAL/FIBER) 1,000 mL (02/10/16 1630)   PRN Meds:.acetaminophen **OR** acetaminophen, ondansetron **OR** ondansetron (ZOFRAN) IV   Antibiotics   Anti-infectives    Start     Dose/Rate Route Frequency Ordered Stop   02/09/16 1130  vancomycin (VANCOCIN) IVPB 750 mg/150 ml premix     750 mg 150 mL/hr over 60 Minutes Intravenous Every 12 hours 02/09/16 1042     02/09/16 1100  vancomycin (VANCOCIN) 1,250 mg in sodium chloride 0.9 % 250 mL IVPB  Status:  Discontinued     1,250 mg 166.7 mL/hr over 90 Minutes Intravenous Every 24 hours 02/08/16 1112 02/09/16 1042   02/08/16 1900  piperacillin-tazobactam (ZOSYN) IVPB 3.375 g     3.375 g 12.5 mL/hr over 240 Minutes Intravenous Every 8 hours 02/08/16 1112     02/08/16 1115  piperacillin-tazobactam (ZOSYN) IVPB 3.375 g     3.375 g 100 mL/hr over 30 Minutes  Intravenous  Once 02/08/16 1102 02/08/16 1138   02/08/16 1115  vancomycin (VANCOCIN) 1,500 mg in sodium chloride 0.9 % 500 mL IVPB     1,500 mg 250 mL/hr over 120 Minutes Intravenous  Once 02/08/16 1102 02/08/16 1308        Subjective:   Shantrell Snide was seen and examined today. Sleepy, appears comfortable, unable to obtain review of system from the patient due to her mental status. Tolerating tube feeds. No fevers or chills. No acute events overnight.   Objective:   Filed Vitals:   02/10/16 2000 02/11/16 0421 02/11/16 1036 02/11/16 1200  BP: 129/68 112/62 121/56 115/46  Pulse: 95 93 99 100  Temp: 98.2 F (36.8 C) 98.8 F (37.1 C)  98.9 F (37.2 C)  TempSrc: Oral Oral  Oral  Resp: 16   18  Height:      Weight:  85.049 kg (187 lb 8 oz)    SpO2: 95% 100%  93%    Intake/Output Summary (Last 24 hours) at 02/11/16 1344 Last data filed at 02/11/16 0900  Gross per 24 hour  Intake   1025 ml  Output      0 ml  Net   1025 ml     Wt Readings from Last 3 Encounters:  02/11/16 85.049 kg (187 lb 8 oz)  12/27/15 62.324 kg (137 lb 6.4 oz)  07/30/15 64.411 kg (142 lb)     Exam  General: Sleepy, comfortable  HEENT:    Neck:   CVS: S1 S2 auscultated, no rubs, murmurs or gallops. Regular rate and rhythm.  Respiratory: Clear to auscultation bilaterally Anteriorly  Abdomen: Soft, nontender, nondistended, + bowel sounds, PEG tube  Ext: no cyanosis clubbing or edema, contractures  Neuro: does not cooperate with exam  Skin: No rashes  Psych: appears to be comfortable   Data Reviewed:  I have personally reviewed following labs and imaging studies  Micro Results Recent Results (from the past 240 hour(s))  Urine culture     Status: None   Collection Time: 02/08/16  9:55 AM  Result Value Ref Range Status   Specimen Description URINE, RANDOM  Final   Special Requests NONE  Final   Culture NO GROWTH 1 DAY  Final   Report Status 02/09/2016 FINAL  Final  Culture,  blood (Routine x 2)     Status: None (Preliminary result)   Collection Time: 02/08/16 10:19 AM  Result Value Ref Range Status   Specimen Description BLOOD RIGHT ANTECUBITAL  Final  Special Requests BOTTLES DRAWN AEROBIC ONLY 5CC  Final   Culture NO GROWTH 3 DAYS  Final   Report Status PENDING  Incomplete  Culture, blood (Routine x 2)     Status: Abnormal   Collection Time: 02/08/16 10:25 AM  Result Value Ref Range Status   Specimen Description BLOOD RIGHT HAND  Final   Special Requests BOTTLES DRAWN AEROBIC ONLY 5CC  Final   Culture  Setup Time   Final    GRAM POSITIVE COCCI IN CLUSTERS AEROBIC BOTTLE ONLY CRITICAL RESULT CALLED TO, READ BACK BY AND VERIFIED WITH: A. NAGBIDANG,RN AT 1253 ON [id number] BY S. YARBROUGH    Culture (A)  Final    STAPHYLOCOCCUS SPECIES (COAGULASE NEGATIVE) THE SIGNIFICANCE OF ISOLATING THIS ORGANISM FROM A SINGLE SET OF BLOOD CULTURES WHEN MULTIPLE SETS ARE DRAWN IS UNCERTAIN. PLEASE NOTIFY THE MICROBIOLOGY DEPARTMENT WITHIN ONE WEEK IF SPECIATION AND SENSITIVITIES ARE REQUIRED.    Report Status 02/11/2016 FINAL  Final  MRSA PCR Screening     Status: None   Collection Time: 02/08/16 11:09 PM  Result Value Ref Range Status   MRSA by PCR NEGATIVE NEGATIVE Final    Comment:        The GeneXpert MRSA Assay (FDA approved for NASAL specimens only), is one component of a comprehensive MRSA colonization surveillance program. It is not intended to diagnose MRSA infection nor to guide or monitor treatment for MRSA infections.     Radiology Reports Dg Chest Port 1 View  02/08/2016  CLINICAL DATA:  Sepsis. EXAM: PORTABLE CHEST 1 VIEW COMPARISON:  11/11/2014 FINDINGS: There are very low lung volumes. Improved aeration at the lung bases. There continues to be some hazy densities at the left lung base. A gas in the stomach. Multiple surgical clips in right abdomen. Heart size is grossly stable but poorly characterized due to patient rotation and low lung volumes.  IMPRESSION: Low lung volumes but improved aeration in both lungs. There continues to be some hazy densities at the left lung base. Electronically Signed   By: Markus Daft M.D.   On: 02/08/2016 11:22    CBC  Recent Labs Lab 02/08/16 1021 02/09/16 0348 02/11/16 0455  WBC 23.4* 17.1* 13.8*  HGB 15.5* 12.4 10.4*  HCT 45.9 38.3 32.8*  PLT 160 160 186  MCV 95.4 95.8 97.0  MCH 32.2 31.0 30.8  MCHC 33.8 32.4 31.7  RDW 14.8 14.8 14.5  LYMPHSABS 2.8  --   --   MONOABS 3.0*  --   --   EOSABS 0.0  --   --   BASOSABS 0.0  --   --     Chemistries   Recent Labs Lab 02/08/16 1021 02/09/16 0348 02/11/16 0455  NA 139 144 144  K 4.6 4.4 3.1*  CL 100* 108 107  CO2 26 26 24   GLUCOSE 98 78 132*  BUN 19 18 11   CREATININE 0.66 0.55 0.55  CALCIUM 9.2 8.7* 8.3*  AST 29  --   --   ALT 17  --   --   ALKPHOS 96  --   --   BILITOT 0.6  --   --    ------------------------------------------------------------------------------------------------------------------ estimated creatinine clearance is 67.2 mL/min (by C-G formula based on Cr of 0.55). ------------------------------------------------------------------------------------------------------------------ No results for input(s): HGBA1C in the last 72 hours. ------------------------------------------------------------------------------------------------------------------ No results for input(s): CHOL, HDL, LDLCALC, TRIG, CHOLHDL, LDLDIRECT in the last 72 hours. ------------------------------------------------------------------------------------------------------------------ No results for input(s): TSH, T4TOTAL, T3FREE, THYROIDAB in the last 72 hours.  Invalid input(s): FREET3 ------------------------------------------------------------------------------------------------------------------ No results for input(s): VITAMINB12, FOLATE, FERRITIN, TIBC, IRON, RETICCTPCT in the last 72 hours.  Coagulation profile No results for input(s): INR,  PROTIME in the last 168 hours.  No results for input(s): DDIMER in the last 72 hours.  Cardiac Enzymes No results for input(s): CKMB, TROPONINI, MYOGLOBIN in the last 168 hours.  Invalid input(s): CK ------------------------------------------------------------------------------------------------------------------ Invalid input(s): POCBNP   Recent Labs  02/10/16 1645 02/10/16 1949 02/11/16 0020 02/11/16 0421 02/11/16 0818 02/11/16 1306  GLUCAP 124* 133* 161* 152* 130* 130*     RAI,RIPUDEEP M.D. [facility] 02/11/2016, 1:44 PM  Pager: [phone] Between 7am to 7pm - call Pager - [phone]  After 7pm go to [email or web address] - password [email or web address]  Call night coverage person covering after 7pm

## 2016-02-11 NOTE — Progress Notes (Signed)
Patient is stable, tolerated tube feeding well during the night, morning medications given no problems during the night. DB

## 2016-02-11 NOTE — Progress Notes (Signed)
Pharmacy Antibiotic Note  Dawn [name] is a 70 y.o. female admitted on 02/08/2016 with sepsis.  Pharmacy has been consulted for vanc/zosyn dosing. VT today of 13. Will increase dose with therapeutic target of 15-20.  Remains afebrile, WBC 17.1>13.8.   Plan: Continue Zosyn 3.375g IV q8h Adjust Vanc to 1000mg  IV q12h Monitor clinical progress, c/s, renal function, abx plan/LOT VT@SS  as indicated  Height: 5\' 2"  (157.5 cm) Weight: 187 lb 8 oz (85.049 kg) (bedscale) IBW/kg (Calculated) : 50.1  Temp (24hrs), Avg:98.6 F (37 C), Min:98.2 F (36.8 C), Max:98.9 F (37.2 C)   Recent Labs Lab 02/08/16 1021 02/08/16 1029 02/08/16 1433 02/08/16 1828 02/08/16 2115 02/09/16 0348 02/11/16 0455 02/11/16 1055  WBC 23.4*  --   --   --   --  17.1* 13.8*  --   CREATININE 0.66  --   --   --   --  0.55 0.55  --   LATICACIDVEN  --  3.65* 2.90* 2.1* 2.1*  --   --   --   VANCOTROUGH  --   --   --   --   --   --   --  13    Estimated Creatinine Clearance: 67.2 mL/min (by C-G formula based on Cr of 0.55).    Allergies  Allergen Reactions  . Carvedilol   . Ppd [Tuberculin Purified Protein Derivative]     Per MAR    Antimicrobials this admission: 4/26 vanc >>  4/26 zosyn >>   Dose adjustments this admission: 4/27: Adjusting vanc to 750 q 12 h 4/29: Vanc to 1000mg  q12h  Microbiology results: 4/26 BCx x2 - CoNS (1 of 2) 4/26 UCx - negative 4/26 MRSA PCR - negative    Thank you for allowing [place] to participate in this patients care.  Stephens November, PharmD Clinical Pharmacy Resident 2:57 PM, 02/11/2016

## 2016-02-12 DIAGNOSIS — E039 Hypothyroidism, unspecified: Secondary | ICD-10-CM

## 2016-02-12 LAB — BASIC METABOLIC PANEL
Anion gap: 9 (ref 5–15)
BUN: 12 mg/dL (ref 6–20)
CO2: 30 mmol/L (ref 22–32)
CREATININE: 0.58 mg/dL (ref 0.44–1.00)
Calcium: 8.1 mg/dL — ABNORMAL LOW (ref 8.9–10.3)
Chloride: 104 mmol/L (ref 101–111)
GFR calc Af Amer: >60 mL/min (ref >60)
Glucose, Bld: 101 mg/dL — ABNORMAL HIGH (ref 65–99)
Potassium: 2.7 mmol/L — CL (ref 3.5–5.1)
Sodium: 143 mmol/L (ref 135–145)

## 2016-02-12 LAB — GLUCOSE, CAPILLARY
GLUCOSE-CAPILLARY: 113 mg/dL — AB (ref 65–99)
GLUCOSE-CAPILLARY: 117 mg/dL — AB (ref 65–99)
GLUCOSE-CAPILLARY: 136 mg/dL — AB (ref 65–99)
GLUCOSE-CAPILLARY: 157 mg/dL — AB (ref 65–99)
Glucose-Capillary: 80 mg/dL (ref 65–99)
Glucose-Capillary: 108 mg/dL — ABNORMAL HIGH (ref 65–99)

## 2016-02-12 LAB — POTASSIUM: Potassium: 3.6 mmol/L (ref 3.5–5.1)

## 2016-02-12 LAB — MAGNESIUM
MAGNESIUM: 2 mg/dL (ref 1.7–2.4)
Magnesium: 2 mg/dL (ref 1.7–2.4)

## 2016-02-12 MED ORDER — POTASSIUM CHLORIDE 10 MEQ/100ML IV SOLN
10.0000 meq | INTRAVENOUS | Status: AC
  Administered 2016-02-12 (×4): 10 meq via INTRAVENOUS
  Filled 2016-02-12 (×4): qty 100

## 2016-02-12 MED ORDER — POTASSIUM CHLORIDE 10 MEQ/100ML IV SOLN
10.0000 meq | INTRAVENOUS | Status: DC
Start: 2016-02-12 — End: 2016-02-12

## 2016-02-12 MED ORDER — POTASSIUM CHLORIDE CRYS ER 20 MEQ PO TBCR
40.0000 meq | EXTENDED_RELEASE_TABLET | ORAL | Status: AC
  Administered 2016-02-12 (×2): 40 meq via ORAL
  Filled 2016-02-12 (×2): qty 2

## 2016-02-12 NOTE — Progress Notes (Signed)
Patient ID: Dawn [name], female   DOB: January 31, 1946, 70 y.o.   MRN: [id number]

## 2016-02-12 NOTE — Progress Notes (Signed)
[facility]                                                                              Patient Demographics  Dawn [name], is a 70 y.o. female, DOB - 28-Aug-1946, [id number]  Admit date - 02/08/2016   Admitting Physician Waldemar Dickens, MD  Outpatient Primary MD for the patient is Hennie Duos, MD  Outpatient specialists:   LOS - 4  days    Chief Complaint  Patient presents with  . Altered Mental Status       Brief summary  Patient is a 70 year old female with dementia, severe mental retardation, seizures, hypothyroidism, diastolic CHF, hyperlipidemia was brought from nursing home with mental status changes and lethargy. In ED, temp was 100.9, heart rate 109, BP and O2 sats were stable. White count 23.4 , lactic acidosis at the time of admission, pro-calcitonin 0.69. Chest x-ray showed hazy density in the left lung base. UA showed ketones with possible UTI   Assessment & Plan   Sepsis ([place]), possibly secondary to LLL pna, Presenting with tachycardia, leukocytosis, elevated lactic acid. - Continue IV vancomycin and Zosyn, follow cultures - Blood cultures 1/2 GPC Coagulase negative staph - Urine culture showed no growth - Continue IV vancomycin and Zosyn for now   Paroxysmal atrial fibrillation - Continue beta blockers   Hx of diastolic heart failure NYHA II. No evidence for decompensation. Echo Jan 2015 with Grade 2 diastolic dysfunction and EF of 60-65% - continue beta blocker via PEG,holding lasix for now - Discontinued fluids to avoid fluid overload, Tolerating tube feeds  Mental retardation / dementia. Patient non-verbal, not following commands. Nursing Home sent her to ED for increased lethargy.  - Tolerating tube feeds  Seizure disorder, controlled. Followed by [facility] -continue home 3 times a day Keppra as well as Depakote.  Hypothyroidism, stable. TSH 3.04 in mid March.  -continue home Thyroid med via PEG    Hypokalemia - Replace IV and oral, recheck potassium and magnesium level later  Code Status: DO NOT RESUSCITATE  Family Communication: No family member at the bedside.   Disposition Plan: Hopefully DC to snf on Monday  Time Spent in minutes   15 minutes  Procedures  None   Consults   none  DVT Prophylaxis  Lovenox  Medications  Scheduled Meds: . antiseptic oral rinse  7 mL Mouth Rinse q12n4p  . chlorhexidine  15 mL Mouth Rinse BID  . clonazePAM  0.5 mg Per Tube QID  . docusate  200 mg Per Tube BID  . enoxaparin (LOVENOX) injection  40 mg Subcutaneous Q24H  . famotidine  20 mg Per Tube Daily  . feeding supplement (PRO-STAT SUGAR FREE 64)  30 mL Per Tube Daily  . levETIRAcetam  500 mg Per Tube Q0600  . levETIRAcetam  750 mg Per Tube Q24H  . levothyroxine  100 mcg Per Tube QAC breakfast  . metoprolol tartrate  12.5 mg Oral TID  . piperacillin-tazobactam (ZOSYN)  IV  3.375 g Intravenous Q8H  . potassium chloride  10 mEq Intravenous Q1 Hr x 4  . sodium chloride flush  3 mL Intravenous  Q12H  . Valproate Sodium  750 mg Per Tube TID  . vancomycin  1,000 mg Intravenous Q12H   Continuous Infusions: . feeding supplement (JEVITY 1.5 CAL/FIBER) 1,000 mL (02/11/16 1602)   PRN Meds:.acetaminophen **OR** acetaminophen, ondansetron **OR** ondansetron (ZOFRAN) IV   Antibiotics   Anti-infectives    Start     Dose/Rate Route Frequency Ordered Stop   02/11/16 2330  vancomycin (VANCOCIN) IVPB 1000 mg/200 mL premix     1,000 mg 200 mL/hr over 60 Minutes Intravenous Every 12 hours 02/11/16 1459     02/09/16 1130  vancomycin (VANCOCIN) IVPB 750 mg/150 ml premix  Status:  Discontinued     750 mg 150 mL/hr over 60 Minutes Intravenous Every 12 hours 02/09/16 1042 02/11/16 1459   02/09/16 1100  vancomycin (VANCOCIN) 1,250 mg in sodium chloride 0.9 % 250 mL IVPB  Status:  Discontinued     1,250 mg 166.7 mL/hr over 90 Minutes Intravenous Every 24 hours 02/08/16 1112 02/09/16 1042    02/08/16 1900  piperacillin-tazobactam (ZOSYN) IVPB 3.375 g     3.375 g 12.5 mL/hr over 240 Minutes Intravenous Every 8 hours 02/08/16 1112     02/08/16 1115  piperacillin-tazobactam (ZOSYN) IVPB 3.375 g     3.375 g 100 mL/hr over 30 Minutes Intravenous  Once 02/08/16 1102 02/08/16 1138   02/08/16 1115  vancomycin (VANCOCIN) 1,500 mg in sodium chloride 0.9 % 500 mL IVPB     1,500 mg 250 mL/hr over 120 Minutes Intravenous  Once 02/08/16 1102 02/08/16 1308        Subjective:   Dawn [name] was seen and examined today. Alert and awake, tracking otherwise no responses.  Tolerating tube feeds. No fevers or chills. No acute events overnight.   Objective:   Filed Vitals:   02/11/16 1653 02/11/16 2148 02/12/16 0013 02/12/16 0504  BP: 126/67 122/58 125/70 106/69  Pulse: 98 98 88 89  Temp:  100 F (37.8 C) 99.1 F (37.3 C) 99 F (37.2 C)  TempSrc:  Oral Oral Oral  Resp:  16 16 16   Height:      Weight:    87.181 kg (192 lb 3.2 oz)  SpO2:  96% 98% 97%    Intake/Output Summary (Last 24 hours) at 02/12/16 1125 Last data filed at 02/12/16 0700  Gross per 24 hour  Intake   1395 ml  Output      0 ml  Net   1395 ml     Wt Readings from Last 3 Encounters:  02/12/16 87.181 kg (192 lb 3.2 oz)  12/27/15 62.324 kg (137 lb 6.4 oz)  07/30/15 64.411 kg (142 lb)     Exam  General: Comfortable, alert but not responsive to any questions or commands  HEENT:    Neck:   CVS: S1 S2 auscultated, no rubs, murmurs or gallops. RRR  Respiratory: Clear to auscultation bilaterally Anteriorly  Abdomen: Soft, nontender, nondistended, + bowel sounds, PEG tube  Ext: no cyanosis clubbing or edema, contractures  Neuro: does not cooperate with exam  Skin: No rashes  Psych: appears to be comfortable   Data Reviewed:  I have personally reviewed following labs and imaging studies  Micro Results Recent Results (from the past 240 hour(s))  Urine culture     Status: None   Collection  Time: 02/08/16  9:55 AM  Result Value Ref Range Status   Specimen Description URINE, RANDOM  Final   Special Requests NONE  Final   Culture NO GROWTH 1 DAY  Final   Report Status 02/09/2016 FINAL  Final  Culture, blood (Routine x 2)     Status: None (Preliminary result)   Collection Time: 02/08/16 10:19 AM  Result Value Ref Range Status   Specimen Description BLOOD RIGHT ANTECUBITAL  Final   Special Requests BOTTLES DRAWN AEROBIC ONLY 5CC  Final   Culture NO GROWTH 3 DAYS  Final   Report Status PENDING  Incomplete  Culture, blood (Routine x 2)     Status: Abnormal   Collection Time: 02/08/16 10:25 AM  Result Value Ref Range Status   Specimen Description BLOOD RIGHT HAND  Final   Special Requests BOTTLES DRAWN AEROBIC ONLY 5CC  Final   Culture  Setup Time   Final    GRAM POSITIVE COCCI IN CLUSTERS AEROBIC BOTTLE ONLY CRITICAL RESULT CALLED TO, READ BACK BY AND VERIFIED WITH: A. NAGBIDANG,RN AT 1253 ON [id number] BY S. YARBROUGH    Culture (A)  Final    STAPHYLOCOCCUS SPECIES (COAGULASE NEGATIVE) THE SIGNIFICANCE OF ISOLATING THIS ORGANISM FROM A SINGLE SET OF BLOOD CULTURES WHEN MULTIPLE SETS ARE DRAWN IS UNCERTAIN. PLEASE NOTIFY THE MICROBIOLOGY DEPARTMENT WITHIN ONE WEEK IF SPECIATION AND SENSITIVITIES ARE REQUIRED.    Report Status 02/11/2016 FINAL  Final  MRSA PCR Screening     Status: None   Collection Time: 02/08/16 11:09 PM  Result Value Ref Range Status   MRSA by PCR NEGATIVE NEGATIVE Final    Comment:        The GeneXpert MRSA Assay (FDA approved for NASAL specimens only), is one component of a comprehensive MRSA colonization surveillance program. It is not intended to diagnose MRSA infection nor to guide or monitor treatment for MRSA infections.     Radiology Reports Dg Chest Port 1 View  02/08/2016  CLINICAL DATA:  Sepsis. EXAM: PORTABLE CHEST 1 VIEW COMPARISON:  11/11/2014 FINDINGS: There are very low lung volumes. Improved aeration at the lung bases. There  continues to be some hazy densities at the left lung base. A gas in the stomach. Multiple surgical clips in right abdomen. Heart size is grossly stable but poorly characterized due to patient rotation and low lung volumes. IMPRESSION: Low lung volumes but improved aeration in both lungs. There continues to be some hazy densities at the left lung base. Electronically Signed   By: Markus Daft M.D.   On: 02/08/2016 11:22    CBC  Recent Labs Lab 02/08/16 1021 02/09/16 0348 02/11/16 0455  WBC 23.4* 17.1* 13.8*  HGB 15.5* 12.4 10.4*  HCT 45.9 38.3 32.8*  PLT 160 160 186  MCV 95.4 95.8 97.0  MCH 32.2 31.0 30.8  MCHC 33.8 32.4 31.7  RDW 14.8 14.8 14.5  LYMPHSABS 2.8  --   --   MONOABS 3.0*  --   --   EOSABS 0.0  --   --   BASOSABS 0.0  --   --     Chemistries   Recent Labs Lab 02/08/16 1021 02/09/16 0348 02/11/16 0455 02/12/16 0355  NA 139 144 144 143  K 4.6 4.4 3.1* 2.7*  CL 100* 108 107 104  CO2 26 26 24 30   GLUCOSE 98 78 132* 101*  BUN 19 18 11 12   CREATININE 0.66 0.55 0.55 0.58  CALCIUM 9.2 8.7* 8.3* 8.1*  AST 29  --   --   --   ALT 17  --   --   --   ALKPHOS 96  --   --   --   BILITOT  0.6  --   --   --    ------------------------------------------------------------------------------------------------------------------ estimated creatinine clearance is 68 mL/min (by C-G formula based on Cr of 0.58). ------------------------------------------------------------------------------------------------------------------ No results for input(s): HGBA1C in the last 72 hours. ------------------------------------------------------------------------------------------------------------------ No results for input(s): CHOL, HDL, LDLCALC, TRIG, CHOLHDL, LDLDIRECT in the last 72 hours. ------------------------------------------------------------------------------------------------------------------ No results for input(s): TSH, T4TOTAL, T3FREE, THYROIDAB in the last 72 hours.  Invalid  input(s): FREET3 ------------------------------------------------------------------------------------------------------------------ No results for input(s): VITAMINB12, FOLATE, FERRITIN, TIBC, IRON, RETICCTPCT in the last 72 hours.  Coagulation profile No results for input(s): INR, PROTIME in the last 168 hours.  No results for input(s): DDIMER in the last 72 hours.  Cardiac Enzymes No results for input(s): CKMB, TROPONINI, MYOGLOBIN in the last 168 hours.  Invalid input(s): CK ------------------------------------------------------------------------------------------------------------------ Invalid input(s): [place]  02/11/16 1306 02/11/16 1651 02/11/16 1953 02/12/16 0012 02/12/16 0512 02/12/16 0823  GLUCAP 130* 118* 110* 113* 61 108*     Jodelle Fausto M.D. [facility] 02/12/2016, 11:25 AM  Pager: [phone] Between 7am to 7pm - call Pager - [phone]  After 7pm go to [email or web address] - password [email or web address]  Call night coverage person covering after 7pm

## 2016-02-13 LAB — GLUCOSE, CAPILLARY
GLUCOSE-CAPILLARY: 139 mg/dL — AB (ref 65–99)
GLUCOSE-CAPILLARY: 125 mg/dL — AB (ref 65–99)
GLUCOSE-CAPILLARY: 125 mg/dL — AB (ref 65–99)
GLUCOSE-CAPILLARY: 118 mg/dL — AB (ref 65–99)
Glucose-Capillary: 128 mg/dL — ABNORMAL HIGH (ref 65–99)
Glucose-Capillary: 168 mg/dL — ABNORMAL HIGH (ref 65–99)

## 2016-02-13 LAB — CBC
HEMATOCRIT: 32 % — AB (ref 36.0–46.0)
HEMOGLOBIN: 10.5 g/dL — AB (ref 12.0–15.0)
MCH: 32.4 pg (ref 26.0–34.0)
MCHC: 32.8 g/dL (ref 30.0–36.0)
MCV: 98.8 fL (ref 78.0–100.0)
Platelets: 223 10*3/uL (ref 150–400)
RBC: 3.24 MIL/uL — ABNORMAL LOW (ref 3.87–5.11)
RDW: 14.6 % (ref 11.5–15.5)
WBC: 12.9 10*3/uL — ABNORMAL HIGH (ref 4.0–10.5)

## 2016-02-13 LAB — BASIC METABOLIC PANEL
Anion gap: 8 (ref 5–15)
BUN: 10 mg/dL (ref 6–20)
CO2: 31 mmol/L (ref 22–32)
Calcium: 8.5 mg/dL — ABNORMAL LOW (ref 8.9–10.3)
Chloride: 108 mmol/L (ref 101–111)
Creatinine, Ser: 0.66 mg/dL (ref 0.44–1.00)
GFR calc Af Amer: >60 mL/min (ref >60)
GLUCOSE: 149 mg/dL — AB (ref 65–99)
POTASSIUM: 3.7 mmol/L (ref 3.5–5.1)
Sodium: 147 mmol/L — ABNORMAL HIGH (ref 135–145)

## 2016-02-13 LAB — CULTURE, BLOOD (ROUTINE X 2): CULTURE: NO GROWTH

## 2016-02-13 MED ORDER — FUROSEMIDE 10 MG/ML PO SOLN
20.0000 mg | Freq: Two times a day (BID) | ORAL | Status: DC
  Administered 2016-02-13 – 2016-02-14 (×2): 20 mg
  Filled 2016-02-13 (×3): qty 2

## 2016-02-13 MED ORDER — LEVOFLOXACIN 750 MG PO TABS: 750.0000 mg | ORAL_TABLET | Freq: Every day | ORAL | Status: DC

## 2016-02-13 MED ORDER — POTASSIUM CHLORIDE 20 MEQ/15ML (10%) PO SOLN
20.0000 meq | Freq: Every day | ORAL | Status: DC
  Administered 2016-02-13 – 2016-02-14 (×2): 20 meq
  Filled 2016-02-13 (×2): qty 15

## 2016-02-13 MED ORDER — AMOXICILLIN-POT CLAVULANATE 250-62.5 MG/5ML PO SUSR
500.0000 mg | Freq: Two times a day (BID) | ORAL | Status: DC
  Administered 2016-02-13 – 2016-02-14 (×3): 500 mg via ORAL
  Filled 2016-02-13 (×3): qty 10

## 2016-02-13 MED ORDER — LEVOTHYROXINE SODIUM 100 MCG PO TABS: 100.0000 ug | ORAL_TABLET | Freq: Every day | ORAL | Status: DC

## 2016-02-13 NOTE — Care Management Important Message (Signed)
Important Message  Patient Details  Name: NORMANDIE BLANKS MRN: [id number] Date of Birth: 1945/12/22   Medicare Important Message Given:  Yes    Savio Albrecht P [place] 02/13/2016, 1:55 PM

## 2016-02-13 NOTE — Clinical Social Work Note (Signed)
CSW continues to follow patient's progress. Will discharge to [facility] once medically stable.  Dayton Scrape, [place]

## 2016-02-13 NOTE — Progress Notes (Signed)
[facility]                                                                              Patient Demographics  Dawn [name], is a 70 y.o. female, DOB - Sep 24, 1946, [id number]  Admit date - 02/08/2016   Admitting Physician Waldemar Dickens, MD  Outpatient Primary MD for the patient is Hennie Duos, MD  Outpatient specialists:   LOS - 5  days    Chief Complaint  Patient presents with  . Altered Mental Status       Brief summary  Patient is a 70 year old female with dementia, severe mental retardation, seizures, hypothyroidism, diastolic CHF, hyperlipidemia was brought from nursing home with mental status changes and lethargy. In ED, temp was 100.9, heart rate 109, BP and O2 sats were stable. White count 23.4 , lactic acidosis at the time of admission, pro-calcitonin 0.69. Chest x-ray showed hazy density in the left lung base. UA showed ketones with possible UTI   Assessment & Plan   Sepsis ([place]), possibly secondary to LLL pna, Presenting with tachycardia, leukocytosis, elevated lactic acid. - Continue IV vancomycin and Zosyn, follow cultures - Blood cultures 1/2 GPC Coagulase negative staph - Urine culture showed no growth - Received 4 days of IV vancomycin and Zosyn, placed on oral Augmentin. If tolerates without any worsening of symptoms, will DC to skilled nursing today in a.m.   Paroxysmal atrial fibrillation - Continue beta blockers   Hx of diastolic heart failure NYHA II. No evidence for decompensation. Echo Jan 2015 with Grade 2 diastolic dysfunction and EF of 60-65% - continue beta blocker via PEG, - Discontinued fluids to avoid fluid overload, Tolerating tube feeds, 7.6 L positive, restart outpatient dose of Lasix with potassium.  Mental retardation / dementia. Patient non-verbal, not following commands. Nursing Home sent her to ED for increased lethargy.  - Tolerating tube feeds  Seizure disorder, controlled. Followed by [facility] -continue home 3 times a day Keppra as well as Depakote.  Hypothyroidism, stable. TSH 3.04 in mid March.  -continue home Thyroid med via PEG   Hypokalemia - Currently resolved, restart home dose of potassium replacement  Code Status: DO NOT RESUSCITATE  Family Communication: No family member at the bedside.   Disposition Plan:  Dc in am   Time Spent in minutes   25 minutes  Procedures  None   Consults   none  DVT Prophylaxis  Lovenox  Medications  Scheduled Meds: . amoxicillin-clavulanate  500 mg Oral Q12H  . antiseptic oral rinse  7 mL Mouth Rinse q12n4p  . chlorhexidine  15 mL Mouth Rinse BID  . clonazePAM  0.5 mg Per Tube QID  . docusate  200 mg Per Tube BID  . enoxaparin (LOVENOX) injection  40 mg Subcutaneous Q24H  . famotidine  20 mg Per Tube Daily  . feeding supplement (PRO-STAT SUGAR FREE 64)  30 mL Per Tube Daily  . levETIRAcetam  500 mg Per Tube Q0600  . levETIRAcetam  750 mg Per Tube Q24H  . levothyroxine  100 mcg Per Tube QAC breakfast  . metoprolol tartrate  12.5 mg Oral TID  .  sodium chloride flush  3 mL Intravenous Q12H  . Valproate Sodium  750 mg Per Tube TID   Continuous Infusions: . feeding supplement (JEVITY 1.5 CAL/FIBER) 1,000 mL (02/12/16 1648)   PRN Meds:.acetaminophen **OR** acetaminophen, ondansetron **OR** ondansetron (ZOFRAN) IV   Antibiotics   Anti-infectives    Start     Dose/Rate Route Frequency Ordered Stop   02/13/16 1345  levofloxacin (LEVAQUIN) tablet 750 mg  Status:  Discontinued     750 mg Oral Daily 02/13/16 1344 02/13/16 1344   02/13/16 1000  amoxicillin-clavulanate (AUGMENTIN) 250-62.5 MG/5ML suspension 500 mg     500 mg Oral Every 12 hours 02/13/16 0943 02/16/16 0959   02/11/16 2330  vancomycin (VANCOCIN) IVPB 1000 mg/200 mL premix  Status:  Discontinued     1,000 mg 200 mL/hr over 60 Minutes Intravenous Every 12 hours 02/11/16 1459 02/13/16 0943   02/09/16 1130  vancomycin (VANCOCIN) IVPB 750 mg/150 ml  premix  Status:  Discontinued     750 mg 150 mL/hr over 60 Minutes Intravenous Every 12 hours 02/09/16 1042 02/11/16 1459   02/09/16 1100  vancomycin (VANCOCIN) 1,250 mg in sodium chloride 0.9 % 250 mL IVPB  Status:  Discontinued     1,250 mg 166.7 mL/hr over 90 Minutes Intravenous Every 24 hours 02/08/16 1112 02/09/16 1042   02/08/16 1900  piperacillin-tazobactam (ZOSYN) IVPB 3.375 g  Status:  Discontinued     3.375 g 12.5 mL/hr over 240 Minutes Intravenous Every 8 hours 02/08/16 1112 02/13/16 0943   02/08/16 1115  piperacillin-tazobactam (ZOSYN) IVPB 3.375 g     3.375 g 100 mL/hr over 30 Minutes Intravenous  Once 02/08/16 1102 02/08/16 1138   02/08/16 1115  vancomycin (VANCOCIN) 1,500 mg in sodium chloride 0.9 % 500 mL IVPB     1,500 mg 250 mL/hr over 120 Minutes Intravenous  Once 02/08/16 1102 02/08/16 1308        Subjective:   Dawn [name] was seen and examined today. Tolerating tube feeds. No fevers or chills. No acute events overnight.   Objective:   Filed Vitals:   02/12/16 1230 02/12/16 2100 02/13/16 0100 02/13/16 0554  BP: 120/67 122/58 120/68 122/63  Pulse: 93 90 86 78  Temp:  99 F (37.2 C) 98.7 F (37.1 C) 98.6 F (37 C)  TempSrc:  Oral Oral Oral  Resp: 16 18 19 19   Height:      Weight:    87.174 kg (192 lb 2.9 oz)  SpO2: 96% 94% 96% 98%    Intake/Output Summary (Last 24 hours) at 02/13/16 1344 Last data filed at 02/13/16 1249  Gross per 24 hour  Intake   1290 ml  Output      0 ml  Net   1290 ml     Wt Readings from Last 3 Encounters:  02/13/16 87.174 kg (192 lb 2.9 oz)  12/27/15 62.324 kg (137 lb 6.4 oz)  07/30/15 64.411 kg (142 lb)     Exam  General: Comfortable, alert but not responsive to any questions or commands  HEENT:    Neck:   CVS: S1 S2 clear. RRR  Respiratory: Decreased breath sounds at the bases  Abdomen: Soft, nontender, nondistended, + bowel sounds, PEG tube  Ext: no cyanosis clubbing or edema,  contractures  Neuro: does not cooperate with exam  Skin: No rashes  Psych: appears to be comfortable   Data Reviewed:  I have personally reviewed following labs and imaging studies  Micro Results Recent Results (from the past 240  hour(s))  Urine culture     Status: None   Collection Time: 02/08/16  9:55 AM  Result Value Ref Range Status   Specimen Description URINE, RANDOM  Final   Special Requests NONE  Final   Culture NO GROWTH 1 DAY  Final   Report Status 02/09/2016 FINAL  Final  Culture, blood (Routine x 2)     Status: None   Collection Time: 02/08/16 10:19 AM  Result Value Ref Range Status   Specimen Description BLOOD RIGHT ANTECUBITAL  Final   Special Requests BOTTLES DRAWN AEROBIC ONLY 5CC  Final   Culture NO GROWTH 5 DAYS  Final   Report Status 02/13/2016 FINAL  Final  Culture, blood (Routine x 2)     Status: Abnormal   Collection Time: 02/08/16 10:25 AM  Result Value Ref Range Status   Specimen Description BLOOD RIGHT HAND  Final   Special Requests BOTTLES DRAWN AEROBIC ONLY 5CC  Final   Culture  Setup Time   Final    GRAM POSITIVE COCCI IN CLUSTERS AEROBIC BOTTLE ONLY CRITICAL RESULT CALLED TO, READ BACK BY AND VERIFIED WITH: A. NAGBIDANG,RN AT 1253 ON [id number] BY S. YARBROUGH    Culture (A)  Final    STAPHYLOCOCCUS SPECIES (COAGULASE NEGATIVE) THE SIGNIFICANCE OF ISOLATING THIS ORGANISM FROM A SINGLE SET OF BLOOD CULTURES WHEN MULTIPLE SETS ARE DRAWN IS UNCERTAIN. PLEASE NOTIFY THE MICROBIOLOGY DEPARTMENT WITHIN ONE WEEK IF SPECIATION AND SENSITIVITIES ARE REQUIRED.    Report Status 02/11/2016 FINAL  Final  MRSA PCR Screening     Status: None   Collection Time: 02/08/16 11:09 PM  Result Value Ref Range Status   MRSA by PCR NEGATIVE NEGATIVE Final    Comment:        The GeneXpert MRSA Assay (FDA approved for NASAL specimens only), is one component of a comprehensive MRSA colonization surveillance program. It is not intended to diagnose MRSA infection nor  to guide or monitor treatment for MRSA infections.     Radiology Reports Dg Chest Port 1 View  02/08/2016  CLINICAL DATA:  Sepsis. EXAM: PORTABLE CHEST 1 VIEW COMPARISON:  11/11/2014 FINDINGS: There are very low lung volumes. Improved aeration at the lung bases. There continues to be some hazy densities at the left lung base. A gas in the stomach. Multiple surgical clips in right abdomen. Heart size is grossly stable but poorly characterized due to patient rotation and low lung volumes. IMPRESSION: Low lung volumes but improved aeration in both lungs. There continues to be some hazy densities at the left lung base. Electronically Signed   By: Markus Daft M.D.   On: 02/08/2016 11:22    CBC  Recent Labs Lab 02/08/16 1021 02/09/16 0348 02/11/16 0455 02/13/16 0405  WBC 23.4* 17.1* 13.8* 12.9*  HGB 15.5* 12.4 10.4* 10.5*  HCT 45.9 38.3 32.8* 32.0*  PLT 160 160 186 223  MCV 95.4 95.8 97.0 98.8  MCH 32.2 31.0 30.8 32.4  MCHC 33.8 32.4 31.7 32.8  RDW 14.8 14.8 14.5 14.6  LYMPHSABS 2.8  --   --   --   MONOABS 3.0*  --   --   --   EOSABS 0.0  --   --   --   BASOSABS 0.0  --   --   --     Chemistries   Recent Labs Lab 02/08/16 1021 02/09/16 0348 02/11/16 0455 02/12/16 0355 02/12/16 1109 02/12/16 1542 02/13/16 0405  NA 139 144 144 143  --   --  147*  K 4.6 4.4 3.1* 2.7*  --  3.6 3.7  CL 100* 108 107 104  --   --  108  CO2 26 26 24 30   --   --  31  GLUCOSE 98 78 132* 101*  --   --  149*  BUN 19 18 11 12   --   --  10  CREATININE 0.66 0.55 0.55 0.58  --   --  0.66  CALCIUM 9.2 8.7* 8.3* 8.1*  --   --  8.5*  MG  --   --   --   --  2.0 2.0  --   AST 29  --   --   --   --   --   --   ALT 17  --   --   --   --   --   --   ALKPHOS 96  --   --   --   --   --   --   BILITOT 0.6  --   --   --   --   --   --    ------------------------------------------------------------------------------------------------------------------ estimated creatinine clearance is 68 mL/min (by C-G formula  based on Cr of 0.66). ------------------------------------------------------------------------------------------------------------------ No results for input(s): HGBA1C in the last 72 hours. ------------------------------------------------------------------------------------------------------------------ No results for input(s): CHOL, HDL, LDLCALC, TRIG, CHOLHDL, LDLDIRECT in the last 72 hours. ------------------------------------------------------------------------------------------------------------------ No results for input(s): TSH, T4TOTAL, T3FREE, THYROIDAB in the last 72 hours.  Invalid input(s): FREET3 ------------------------------------------------------------------------------------------------------------------ No results for input(s): VITAMINB12, FOLATE, FERRITIN, TIBC, IRON, RETICCTPCT in the last 72 hours.  Coagulation profile No results for input(s): INR, PROTIME in the last 168 hours.  No results for input(s): DDIMER in the last 72 hours.  Cardiac Enzymes No results for input(s): CKMB, TROPONINI, MYOGLOBIN in the last 168 hours.  Invalid input(s): CK ------------------------------------------------------------------------------------------------------------------ Invalid input(s): POCBNP   Recent Labs  02/12/16 1625 02/12/16 1956 02/13/16 0041 02/13/16 0406 02/13/16 0745 02/13/16 1128  GLUCAP 136* 117* 168* 139* 125* 128*     RAI,RIPUDEEP M.D. [facility] 02/13/2016, 1:44 PM  Pager: [phone] Between 7am to 7pm - call Pager - [phone]  After 7pm go to [email or web address] - password [email or web address]  Call night coverage person covering after 7pm

## 2016-02-14 ENCOUNTER — Telehealth: Payer: Self-pay | Admitting: Internal Medicine

## 2016-02-14 ENCOUNTER — Non-Acute Institutional Stay (SKILLED_NURSING_FACILITY): Payer: Medicare Other | Admitting: Internal Medicine

## 2016-02-14 DIAGNOSIS — A419 Sepsis, unspecified organism: Secondary | ICD-10-CM | POA: Diagnosis not present

## 2016-02-14 DIAGNOSIS — J189 Pneumonia, unspecified organism: Secondary | ICD-10-CM | POA: Diagnosis not present

## 2016-02-14 DIAGNOSIS — I480 Paroxysmal atrial fibrillation: Secondary | ICD-10-CM | POA: Diagnosis not present

## 2016-02-14 DIAGNOSIS — I5030 Unspecified diastolic (congestive) heart failure: Secondary | ICD-10-CM | POA: Diagnosis not present

## 2016-02-14 DIAGNOSIS — E038 Other specified hypothyroidism: Secondary | ICD-10-CM

## 2016-02-14 DIAGNOSIS — R569 Unspecified convulsions: Secondary | ICD-10-CM

## 2016-02-14 DIAGNOSIS — J181 Lobar pneumonia, unspecified organism: Secondary | ICD-10-CM

## 2016-02-14 DIAGNOSIS — E034 Atrophy of thyroid (acquired): Secondary | ICD-10-CM

## 2016-02-14 DIAGNOSIS — F0390 Unspecified dementia without behavioral disturbance: Secondary | ICD-10-CM

## 2016-02-14 LAB — GLUCOSE, CAPILLARY
GLUCOSE-CAPILLARY: 144 mg/dL — AB (ref 65–99)
GLUCOSE-CAPILLARY: 118 mg/dL — AB (ref 65–99)
Glucose-Capillary: 124 mg/dL — ABNORMAL HIGH (ref 65–99)
Glucose-Capillary: 135 mg/dL — ABNORMAL HIGH (ref 65–99)

## 2016-02-14 MED ORDER — AMOXICILLIN-POT CLAVULANATE 250-62.5 MG/5ML PO SUSR: 500.0000 mg | Freq: Two times a day (BID) | ORAL | Status: DC

## 2016-02-14 MED ORDER — CLONAZEPAM 0.5 MG PO TABS: 0.5000 mg | ORAL_TABLET | Freq: Four times a day (QID) | ORAL | Status: DC

## 2016-02-14 MED ORDER — JEVITY 1.5 CAL/FIBER PO LIQD: 1000.0000 mL | ORAL | Status: DC

## 2016-02-14 MED ORDER — PRO-STAT SUGAR FREE PO LIQD: 30.0000 mL | Freq: Every day | ORAL | Status: AC

## 2016-02-14 NOTE — Discharge Summary (Signed)
Physician Discharge Summary   Patient ID: Dawn [name] MRN: [id number] DOB/AGE: [birth year] 70 y.o.  Admit date: 02/08/2016 Discharge date: 02/14/2016  Primary Care Physician:  Hennie Duos, MD  Discharge Diagnoses:    .  hcap PNA (pneumonia) . Sepsis ([place]) . Severe mental retardation . Severe dementia . Paroxysmal a-fib ([place]) . Leukocytosis . Hypothyroidism  Consults: None  Recommendations for Outpatient Follow-up:  1. Please repeat CBC/BMET at next visit   DIET: Tube feeds    Allergies:   Allergies  Allergen Reactions  . Carvedilol   . Ppd [Tuberculin Purified Protein Derivative]     Per MAR     DISCHARGE MEDICATIONS: Current Discharge Medication List    START taking these medications   Details  Amino Acids-Protein Hydrolys (FEEDING SUPPLEMENT, PRO-STAT SUGAR FREE 64,) LIQD Place 30 mLs into feeding tube daily. Qty: 900 mL, Refills: 0    amoxicillin-clavulanate (AUGMENTIN) 250-62.5 MG/5ML suspension Place 10 mLs (500 mg total) into feeding tube 2 (two) times daily. X 3 more days Qty: 150 mL, Refills: 0    Nutritional Supplements (FEEDING SUPPLEMENT, JEVITY 1.5 CAL/FIBER,) LIQD Place 1,000 mLs into feeding tube continuous.      CONTINUE these medications which have CHANGED   Details  clonazePAM (KLONOPIN) 0.5 MG tablet Place 1 tablet (0.5 mg total) into feeding tube every 6 (six) hours. Give 1 tablet vie peg tube every 6 hours for cerebral palsy Qty: 30 tablet, Refills: 0      CONTINUE these medications which have NOT CHANGED   Details  docusate (COLACE) 50 MG/5ML liquid Place 20 mLs (200 mg total) into feeding tube 2 (two) times daily. Qty: 100 mL, Refills: 0    famotidine (PEPCID) 20 MG tablet Give 1 tablet via peg tube daily ( 20 mg )    furosemide (LASIX) 20 MG tablet 20 mg by PEG Tube route 2 (two) times daily.    !! levETIRAcetam (KEPPRA) 100 MG/ML solution Place 5 mLs (500 mg total) into feeding tube daily at 6 (six) AM. Qty: 473  mL, Refills: 12    !! levETIRAcetam (KEPPRA) 100 MG/ML solution Place 750 mg into feeding tube daily. 2 p and 8 p    levothyroxine (SYNTHROID, LEVOTHROID) 100 MCG tablet 100 mcg by PEG Tube route daily before breakfast.    metoprolol tartrate (LOPRESSOR) 25 MG tablet 12.5 mg by PEG Tube route 3 (three) times daily.    Multiple Vitamins-Minerals (CENTAMIN) LIQD Place 5 mLs into feeding tube daily.    potassium chloride 20 MEQ/15ML (10%) SOLN 20 mEq by PEG Tube route daily. Dilute with water before administered.    Valproic Acid (DEPAKENE) 250 MG/5ML SYRP syrup Place 15 mLs (750 mg total) into feeding tube 3 (three) times daily. Qty: 600 mL     !! - Potential duplicate medications found. Please discuss with provider.    STOP taking these medications     furosemide (LASIX) 10 MG/ML solution      metoprolol tartrate (LOPRESSOR) 25 mg/10 mL SUSP          Brief H and P: For complete details please refer to admission H and P, but in brief Patient is a 70 year old female with dementia, severe mental retardation, seizures, hypothyroidism, diastolic CHF, hyperlipidemia was brought from nursing home with mental status changes and lethargy. In ED, temp was 100.9, heart rate 109, BP and O2 sats were stable. White count 23.4 , lactic acidosis at the time of admission, pro-calcitonin 0.69. Chest x-ray showed hazy density  in the left lung base. UA showed ketones with possible UTI  Hospital Course:   Sepsis ([place]), possibly secondary to LLL pna, Presenting with tachycardia, leukocytosis, elevated lactic acid. - Continue IV vancomycin and Zosyn, follow cultures - Blood cultures 1/2 GPC Coagulase negative staph - Urine culture showed no growth - Received 4 days of IV vancomycin and Zosyn, placed on oral Augmentin.   Paroxysmal atrial fibrillation - Continue metoprolol   Hx of diastolic heart failure NYHA II. No evidence for decompensation. Echo Jan 2015 with Grade 2 diastolic dysfunction and EF  of 60-65% - continue beta blocker via PEG, - Discontinued fluids to avoid fluid overload, Tolerating tube feeds, 7.6 L positive, restarted outpatient dose of Lasix with potassium.  Mental retardation / dementia. Patient non-verbal, not following commands. Nursing Home sent her to ED for increased lethargy.  - Tolerating tube feeds  Seizure disorder, controlled. Followed by [facility] -continue home 3 times a day Keppra as well as Depakote.  Hypothyroidism, stable. TSH 3.04 in mid March.  -continue home Thyroid med via PEG   Hypokalemia - Currently resolved, restart home dose of potassium replacement  Day of Discharge BP 102/64 mmHg  Pulse 83  Temp(Src) 99.2 F (37.3 C) (Oral)  Resp 18  Ht 5\' 2"  (1.575 m)  Wt 86.229 kg (190 lb 1.6 oz)  BMI 34.76 kg/m2  SpO2 100%  Physical Exam: General: Severe MR and dementia HEENT: anicteric sclera, pupils reactive to light and accommodation CVS: S1-S2 clear no murmur rubs or gallops Chest: clear to auscultation bilaterally, no wheezing rales or rhonchi Abdomen: soft nontender, nondistended, normal bowel sounds Extremities: no cyanosis, clubbing or edema noted bilaterally    The results of significant diagnostics from this hospitalization (including imaging, microbiology, ancillary and laboratory) are listed below for reference.    LAB RESULTS: Basic Metabolic Panel:  Recent Labs Lab 02/12/16 0355  02/12/16 1542 02/13/16 0405  NA 143  --   --  147*  K 2.7*  --  3.6 3.7  CL 104  --   --  108  CO2 30  --   --  31  GLUCOSE 101*  --   --  149*  BUN 12  --   --  10  CREATININE 0.58  --   --  0.66  CALCIUM 8.1*  --   --  8.5*  MG  --   < > 2.0  --   < > = values in this interval not displayed. Liver Function Tests:  Recent Labs Lab 02/08/16 1021  AST 29  ALT 17  ALKPHOS 96  BILITOT 0.6  PROT 7.4  ALBUMIN 2.5*   No results for input(s): LIPASE, AMYLASE in the last 168 hours. No results for input(s): AMMONIA  in the last 168 hours. CBC:  Recent Labs Lab 02/08/16 1021  02/11/16 0455 02/13/16 0405  WBC 23.4*  < > 13.8* 12.9*  NEUTROABS 17.6*  --   --   --   HGB 15.5*  < > 10.4* 10.5*  HCT 45.9  < > 32.8* 32.0*  MCV 95.4  < > 97.0 98.8  PLT 160  < > 186 223  < > = values in this interval not displayed. Cardiac Enzymes: No results for input(s): CKTOTAL, CKMB, CKMBINDEX, TROPONINI in the last 168 hours. BNP: Invalid input(s): POCBNP CBG:  Recent Labs Lab 02/14/16 0400 02/14/16 0901  GLUCAP 144* 118*    Significant Diagnostic Studies:  Dg Chest Port 1 View  02/08/2016  CLINICAL DATA:  Sepsis. EXAM: PORTABLE CHEST 1 VIEW COMPARISON:  11/11/2014 FINDINGS: There are very low lung volumes. Improved aeration at the lung bases. There continues to be some hazy densities at the left lung base. A gas in the stomach. Multiple surgical clips in right abdomen. Heart size is grossly stable but poorly characterized due to patient rotation and low lung volumes. IMPRESSION: Low lung volumes but improved aeration in both lungs. There continues to be some hazy densities at the left lung base. Electronically Signed   By: Markus Daft M.D.   On: 02/08/2016 11:22    2D ECHO:   Disposition and Follow-up: Discharge Instructions    Increase activity slowly    Complete by:  As directed             DISPOSITION: Skilled nursing facility   DISCHARGE FOLLOW-UP Follow-up Information    Follow up with [facility].   Specialty:  Skilled Nursing Facility   Contact information:   [address] [place] [phone]      Follow up with Hennie Duos, MD. Schedule an appointment as soon as possible for a visit in 2 weeks.   Specialty:  Internal Medicine   Why:  for hospital follow-up   Contact information:   [place] [id number] [phone]        Time spent on Discharge: 55mins   Signed:   Kadan Millstein M.D. [facility] 02/14/2016, 11:46 AM Pager: [phone]

## 2016-02-14 NOTE — Clinical Social Work Placement (Signed)
   CLINICAL SOCIAL WORK PLACEMENT  NOTE  Date:  02/14/2016  Patient Details  Name: Dawn [name] MRN: [id number] Date of Birth: [birth year]  Clinical Social Work is seeking post-discharge placement for this patient at the  SNF level of care (*CSW will initial, date and re-position this form in  chart as items are completed):      Patient/family provided with [place] Work Department's list of facilities offering this level of care within the geographic area requested by the patient (or if unable, by the patient's family). Yes      Patient/family informed of their freedom to choose among providers that offer the needed level of care, that participate in Medicare, Medicaid or managed care program needed by the patient, have an available bed and are willing to accept the patient. Yes      Patient/family informed of [place]'s ownership interest in [facility] and [facility], as well as of the fact that they are under no obligation to receive care at these facilities. Yes  PASRR submitted to EDS on       PASRR number received on       Existing PASRR number confirmed on    02/09/16   FL2 transmitted to all facilities in geographic area requested by pt/family on 02/09/16     FL2 transmitted to all facilities within larger geographic area on       Patient informed that his/her managed care company has contracts with or will negotiate with certain facilities, including the following:        Yes   Patient/family informed of bed offers received.  Patient chooses bed at [facility]     Physician recommends and patient chooses bed at      Patient to be transferred to [facility] on 02/14/16.  Patient to be transferred to facility by [facility]     Patient family notified on 02/14/16 of transfer.  Name of family member notified:  [name]     PHYSICIAN Please sign FL2, Please sign DNR, Please prepare prescriptions      Additional Comment:    _______________________________________________ Candie Chroman, LCSW 02/14/2016, 10:11 AM

## 2016-02-14 NOTE — Clinical Social Work Note (Signed)
CSW facilitated patient discharge including contacting patient family and facility to confirm patient discharge plans. Clinical information faxed to facility and family agreeable with plan. CSW arranged ambulance transport via [facility] to [facility]. RN to call report prior to discharge.  CSW will sign off for now as social work intervention is no longer needed. Please consult [place] again if new needs arise.  Dayton Scrape, [place]

## 2016-02-14 NOTE — Progress Notes (Signed)
Pt has orders to be discharged to [facility]. Telemetry box removed. IV removed and site in good condition. PEG feedings on hold now and PEG flushed until patient arrives to facility. Report given to [employer]. Pt stable and waiting for transportation.

## 2016-02-14 NOTE — Telephone Encounter (Signed)
Possible re-admission to facility  - This is a patient you were seeing at **[facility] fu is needed IF patient was re-admitted to facility upon discharge. Hospital discharge **02/14/16**

## 2016-02-15 ENCOUNTER — Encounter: Payer: Self-pay | Admitting: Internal Medicine

## 2016-02-15 NOTE — Assessment & Plan Note (Signed)
SNF - stable: cont metoprolol

## 2016-02-15 NOTE — Assessment & Plan Note (Signed)
SNF - along with MR; pt basically vegetative; cont suportive care

## 2016-02-15 NOTE — Progress Notes (Signed)
MRN: [id number] Name: Dawn [name]  Sex: female Age: 70 y.o. DOB: May 19, 1946  [place] #: [name] Facility/Room:205 Level Of Care: SNF Provider: Inocencio Homes D Emergency Contacts: Extended Emergency Contact Information Primary Emergency Contact: Sellars,[name] Address: [address]          [place] [address] [place] Phone: [phone] Relation: Sister Secondary Emergency Contact: Aloria Cower [place] Phone: [phone] Relation: None  Code Status:   Allergies: Carvedilol and Ppd  Chief Complaint  Patient presents with  . Readmit To SNF    HPI: Patient is 70 y.o. female with dementia, severe mental retardation, seizures, hypothyroidism, diastolic CHF, hyperlipidemia was brought from nursing home with mental status changes and lethargy. In ED, temp was 100.9, heart rate 109, BP and O2 sats were stable. White count 23.4 , lactic acidosis at the time of admission, pro-calcitonin 0.69. Chest x-ray showed hazy density in the left lung base. UA showed ketones with possible UTI. Pt was [facility] to [facility] from 4/26[facility]5/2 where she was treated for sepsis 2/2 PNA. Hospital course was complicated by hypokalemai. Pt is admitted to SNF for residential care. While at SNF pt will be followed for seizure disorder, tx with keppra and depakote, hypothyroidism, tx with synthroid and PAF, tx with metoprolol.   Past Medical History  Diagnosis Date  . Seizures ([place])   . Osteoporosis   . Thyroid disease     hypothyroid  . High cholesterol   . Cataracts, bilateral   . Optic atrophy   . Paraparesis (HCC)     mild  . Hypothyroidism   . Anginal pain ([place])   . Coronary artery disease   . CHF (congestive heart failure) ([place])   . Paroxysmal atrial fibrillation (HCC)   . Dysphagia   . Unspecified convulsions ([place])   . Dementia   . Optic atrophy   . Hypotension   . Anemia   . Hyperlipidemia   . Cataract   . Dental caries   . Esophageal reflux    . Sepsis ([place]) 01/2016    Past Surgical History  Procedure Laterality Date  . Midline incision    . Central venous catheter insertion  05/15/2014       . Esophagogastroduodenoscopy N/A 06/03/2014    Procedure: ESOPHAGOGASTRODUODENOSCOPY (EGD);  Surgeon: Beryle Beams, MD;  Location: [facility] ENDOSCOPY;  Service: Endoscopy;  Laterality: N/A;  . Gastrostomy tube placement        Medication List    Notice    This visit is on the same day as an admission, and a visit start time could not be determined. If the visit took place after discharge, manually review the med list with the patient.      No orders of the defined types were placed in this encounter.    Immunization History  Administered Date(s) Administered  . Influenza-Unspecified 08/13/2014    Social History  Substance Use Topics  . Smoking status: Never Smoker   . Smokeless tobacco: Never Used  . Alcohol Use: No    Family history is +DM2, Ca, colon    Review of Systems  UTO 2/2 vegetative state     Filed Vitals:   02/15/16 1730  BP: 110/70  Pulse: 72  Temp: 97.9 F (36.6 C)  Resp: 20    SpO2 Readings from Last 1 Encounters:  02/14/16 100%        Physical Exam  GENERAL APPEARANCE: eyes open, No acute distress  SKIN: No  diaphoresis rash HEENT: Unremarkable RESPIRATORY: Breathing is even, unlabored. Lung sounds are clear  CARDIOVASCULAR: Heart RRR no murmurs, rubs or gallops. No peripheral edema  GASTROINTESTINAL: Abdomen is soft, non-tender, not distended w/ normal bowel soundsPEG  GENITOURINARY: Bladder non tender, not distended  MUSCULOSKELETAL: wasting and contractures all 4 extremitis NEUROLOGIC: vegetative, unpurposeful upper ext movements at times PSYCHIATRIC: vegetative   Patient Active Problem List   Diagnosis Date Noted  . Seizures ([place]) 02/15/2016  . PNA (pneumonia) 02/08/2016  . Severe mental retardation 01/10/2015  . Severe dementia 01/10/2015  . Senile debility 01/10/2015   . 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 Acute respiratory failure with hypoxia ([place]) 11/20/2014  . Generalized weakness 11/20/2014  . Dysphagia 11/20/2014  . Dementia without behavioral disturbance 11/20/2014  . Development delay 11/02/2014  . Acute respiratory distress (HCC) 11/02/2014  . Paroxysmal ventricular tachycardia ([place]) 10/30/2014  . HCAP (healthcare-associated pneumonia) 10/26/2014  . Hematoma of right parietal scalp 08/13/2014  . Edema 08/09/2014  . Hematoma 08/05/2014  . Fall at nursing home 08/05/2014  . Leukocytosis 07/26/2014  . Tachycardia 07/11/2014  . Leukocytopenia, unspecified 07/11/2014  . Pneumonia 06/17/2014  . Sepsis ([place]) 06/12/2014  . Facial droop 06/12/2014  . Hypernatremia 06/12/2014  . PEG (percutaneous endoscopic gastrostomy) status ([place]) 06/12/2014  . FUO (fever of unknown origin) 06/02/2014  . Paroxysmal a-fib ([place]) 05/14/2014  . Dysphasia 05/14/2014  . UTI (lower urinary tract infection) 05/14/2014  . Tooth decay 05/14/2014  . SIRS (systemic inflammatory response syndrome) ([place]) 12/15/2013  . Thrombocytopenia ([place]) 10/29/2013  . Hypotension 10/29/2013  . Anemia of chronic disease 10/29/2013  . Diastolic heart failure, NYHA class 2 ([place]) 10/29/2013  . Atrial fibrillation with rapid ventricular response ([place]) 10/28/2013  . Seizure disorder ([place]) 10/28/2013  . Hypothyroidism 10/28/2013  . HLD (hyperlipidemia) 10/28/2013  . Non-ST elevation myocardial infarction (NSTEMI) (HCC) 10/28/2013    CBC    Component Value Date/Time   WBC 12.9* 02/13/2016 0405   WBC 10.6 12/07/2015   RBC 3.24* 02/13/2016 0405   RBC 1.74* 05/15/2014 0740   HGB 10.5* 02/13/2016 0405   HCT 32.0* 02/13/2016 0405   HCT 25.4* 11/03/2014 0725   PLT 223 02/13/2016 0405   MCV 98.8 02/13/2016 0405   LYMPHSABS 2.8 02/08/2016 1021   MONOABS 3.0* 02/08/2016 1021   EOSABS 0.0 02/08/2016 1021   BASOSABS 0.0 02/08/2016 1021    CMP     Component Value Date/Time   NA  147* 02/13/2016 0405   NA 137 12/07/2015   K 3.7 02/13/2016 0405   CL 108 02/13/2016 0405   CO2 31 02/13/2016 0405   GLUCOSE 149* 02/13/2016 0405   BUN 10 02/13/2016 0405   BUN 16 12/07/2015   CREATININE 0.66 02/13/2016 0405   CREATININE 0.5 12/07/2015   CALCIUM 8.5* 02/13/2016 0405   PROT 7.4 02/08/2016 1021   ALBUMIN 2.5* 02/08/2016 1021   AST 29 02/08/2016 1021   ALT 17 02/08/2016 1021   ALKPHOS 96 02/08/2016 1021   BILITOT 0.6 02/08/2016 1021   GFRNONAA >60 02/13/2016 0405   GFRAA >60 02/13/2016 0405    No results found for: HGBA1C   Dg Chest Port 1 View  02/08/2016  CLINICAL DATA:  Sepsis. EXAM: PORTABLE CHEST 1 VIEW COMPARISON:  11/11/2014 FINDINGS: There are very low lung volumes. Improved aeration at the lung bases. There continues to be some hazy densities at the left lung base. A gas in the stomach. Multiple surgical clips in right abdomen. Heart size is grossly stable  but poorly characterized due to patient rotation and low lung volumes. IMPRESSION: Low lung volumes but improved aeration in both lungs. There continues to be some hazy densities at the left lung base. Electronically Signed   By: Markus Daft M.D.   On: 02/08/2016 11:22    Not all labs, radiology exams or other studies done during hospitalization come through on my EPIC note; however they are reviewed by me.    Assessment and Plan  Sepsis ([place]) possibly secondary to LLL pna, Presenting with tachycardia, leukocytosis, elevated lactic acid. - Continue IV vancomycin and Zosyn, follow cultures - Blood cultures 1/2 GPC Coagulase negative staph - Urine culture showed no growth - Received 4 days of IV vancomycin and Zosyn, placed on oral Augmentin.  SNF - augmentin for 3 more days  Paroxysmal a-fib (HCC) SNF - stable: cont metoprolol  Diastolic heart failure, NYHA class 2 No evidence for decompensation. Echo Jan 2015 with Grade 2 diastolic dysfunction and EF of 60-65% SNF - Tolerating tube feeds, 7.6 L  positive, restarted outpatient dose of Lasix with potassium; Cont metoprolol   PNA (pneumonia) LLL infiltrate; pt received 4 days of vanc and zosyn  SNF - cont augmentin for 3 more days  Hypothyroidism SNF - TSH - 3.04; cont synthroid 100 mcg per tube  Seizures (HCC) SNF 0 no known recent seizures; cont keppra and depakote  Dementia without behavioral disturbance SNF - along with MR; pt basically vegetative; cont suportive care   Time spent  > 45 min;> 50% of time with patient was spent reviewing records, labs, tests and studies, counseling and developing plan of care  Hennie Duos, MD

## 2016-02-15 NOTE — Assessment & Plan Note (Signed)
SNF 0 no known recent seizures; cont keppra and depakote

## 2016-02-15 NOTE — Assessment & Plan Note (Signed)
No evidence for decompensation. Echo Jan 2015 with Grade 2 diastolic dysfunction and EF of 60-65% SNF - Tolerating tube feeds, 7.6 L positive, restarted outpatient dose of Lasix with potassium; Cont metoprolol

## 2016-02-15 NOTE — Assessment & Plan Note (Signed)
SNF - TSH - 3.04; cont synthroid 100 mcg per tube

## 2016-02-15 NOTE — Assessment & Plan Note (Signed)
possibly secondary to LLL pna, Presenting with tachycardia, leukocytosis, elevated lactic acid. - Continue IV vancomycin and Zosyn, follow cultures - Blood cultures 1/2 GPC Coagulase negative staph - Urine culture showed no growth - Received 4 days of IV vancomycin and Zosyn, placed on oral Augmentin.  SNF - augmentin for 3 more days

## 2016-02-15 NOTE — Assessment & Plan Note (Signed)
LLL infiltrate; pt received 4 days of vanc and zosyn  SNF - cont augmentin for 3 more days

## 2016-04-12 ENCOUNTER — Non-Acute Institutional Stay (SKILLED_NURSING_FACILITY): Payer: Medicare Other | Admitting: Internal Medicine

## 2016-04-12 ENCOUNTER — Encounter: Payer: Self-pay | Admitting: Internal Medicine

## 2016-04-12 ENCOUNTER — Encounter (HOSPITAL_COMMUNITY): Payer: Self-pay | Admitting: *Deleted

## 2016-04-12 ENCOUNTER — Emergency Department (HOSPITAL_COMMUNITY)
Admission: EM | Admit: 2016-04-12 | Discharge: 2016-04-12 | Disposition: A | Discharge status: Home / Self Care | Payer: Medicare Other | Attending: Emergency Medicine | Admitting: Emergency Medicine

## 2016-04-12 ENCOUNTER — Emergency Department (HOSPITAL_COMMUNITY): Payer: Medicare Other

## 2016-04-12 DIAGNOSIS — Z8669 Personal history of other diseases of the nervous system and sense organs: Secondary | ICD-10-CM | POA: Insufficient documentation

## 2016-04-12 DIAGNOSIS — Z8719 Personal history of other diseases of the digestive system: Secondary | ICD-10-CM | POA: Diagnosis not present

## 2016-04-12 DIAGNOSIS — Z79899 Other long term (current) drug therapy: Secondary | ICD-10-CM | POA: Diagnosis not present

## 2016-04-12 DIAGNOSIS — E785 Hyperlipidemia, unspecified: Secondary | ICD-10-CM | POA: Insufficient documentation

## 2016-04-12 DIAGNOSIS — K9423 Gastrostomy malfunction: Secondary | ICD-10-CM | POA: Insufficient documentation

## 2016-04-12 DIAGNOSIS — I4891 Unspecified atrial fibrillation: Secondary | ICD-10-CM

## 2016-04-12 DIAGNOSIS — I480 Paroxysmal atrial fibrillation: Secondary | ICD-10-CM | POA: Diagnosis not present

## 2016-04-12 DIAGNOSIS — M810 Age-related osteoporosis without current pathological fracture: Secondary | ICD-10-CM | POA: Insufficient documentation

## 2016-04-12 DIAGNOSIS — T85528A Displacement of other gastrointestinal prosthetic devices, implants and grafts, initial encounter: Secondary | ICD-10-CM

## 2016-04-12 DIAGNOSIS — I509 Heart failure, unspecified: Secondary | ICD-10-CM | POA: Insufficient documentation

## 2016-04-12 DIAGNOSIS — Z8701 Personal history of pneumonia (recurrent): Secondary | ICD-10-CM | POA: Diagnosis not present

## 2016-04-12 DIAGNOSIS — E039 Hypothyroidism, unspecified: Secondary | ICD-10-CM | POA: Diagnosis not present

## 2016-04-12 DIAGNOSIS — Z8679 Personal history of other diseases of the circulatory system: Secondary | ICD-10-CM | POA: Insufficient documentation

## 2016-04-12 DIAGNOSIS — Z931 Gastrostomy status: Secondary | ICD-10-CM | POA: Diagnosis not present

## 2016-04-12 DIAGNOSIS — Z431 Encounter for attention to gastrostomy: Secondary | ICD-10-CM

## 2016-04-12 DIAGNOSIS — I2510 Atherosclerotic heart disease of native coronary artery without angina pectoris: Secondary | ICD-10-CM | POA: Diagnosis not present

## 2016-04-12 MED ORDER — LIDOCAINE HCL 2 % EX GEL
1.0000 "application " | Freq: Once | CUTANEOUS | Status: AC
  Administered 2016-04-12: 1
  Filled 2016-04-12: qty 11

## 2016-04-12 MED ORDER — DIATRIZOATE MEGLUMINE & SODIUM 66-10 % PO SOLN
30.0000 mL | Freq: Once | ORAL | Status: AC
Start: 2016-04-12 — End: 2016-04-12
  Administered 2016-04-12: 30 mL via ORAL

## 2016-04-12 NOTE — ED Notes (Signed)
Pt arrives via EMS from [facility]. Staff reports within the hour they went in to check on patient and her GTUBE had fallen out. Pt is nonverbal at a baseline. Hx CP. 100/78, 88,16.

## 2016-04-12 NOTE — ED Notes (Signed)
Patient transported to X-ray 

## 2016-04-12 NOTE — Discharge Instructions (Signed)
Dawn [name]' gastrostomy has become stenotic ("closed up") since the gastrostomy tube was pulled out. We were unable to get a 48 [place] or 16 [place] gastrostomy tube into the opening. A 12 French Foley catheter was placed for temporary access so that she can be kept hydrated. Please contact her primary care physician to arrange for definitive gastrostomy tube placement.

## 2016-04-12 NOTE — ED Provider Notes (Signed)
CSN: [id number]     Arrival date & time 04/12/16  0152 History   First MD Initiated Contact with Patient 04/12/16 0207     Chief Complaint  Patient presents with  . Gastric Tube Replacement      (Consider location/radiation/quality/duration/timing/severity/associated sxs/prior Treatment) HPI  This is a 70 year old female with cerebral palsy and severe MR. She was sent here from her nursing home with a complaint of gastrostomy tube pulled out. Staff is not sure how long it has been out. The sizes 20 [place] but they did not send it with her. There is no bleeding at the site.  Past Medical History  Diagnosis Date  . Seizures ([place])   . Osteoporosis   . Thyroid disease     hypothyroid  . High cholesterol   . Cataracts, bilateral   . Optic atrophy   . Paraparesis (HCC)     mild  . Hypothyroidism   . Anginal pain ([place])   . Coronary artery disease   . CHF (congestive heart failure) ([place])   . Paroxysmal atrial fibrillation (HCC)   . Dysphagia   . Unspecified convulsions ([place])   . Dementia   . Optic atrophy   . Hypotension   . Anemia   . Hyperlipidemia   . Cataract   . Dental caries   . Esophageal reflux   . Sepsis ([place]) 01/2016   Past Surgical History  Procedure Laterality Date  . Midline incision    . Central venous catheter insertion  05/15/2014       . Esophagogastroduodenoscopy N/A 06/03/2014    Procedure: ESOPHAGOGASTRODUODENOSCOPY (EGD);  Surgeon: Beryle Beams, MD;  Location: [facility] ENDOSCOPY;  Service: Endoscopy;  Laterality: N/A;  . Gastrostomy tube placement     Family History  Problem Relation Age of Onset  . Colon cancer Mother   . Diabetes type II Sister    Social History  Substance Use Topics  . Smoking status: Never Smoker   . Smokeless tobacco: Never Used  . Alcohol Use: No   OB History    Gravida Para Term Preterm AB TAB SAB Ectopic Multiple Living   0              Review of Systems  Unable to perform ROS: Dementia     Allergies  Carvedilol and  Ppd  Home Medications   Prior to Admission medications   Medication Sig Start Date End Date Taking? Authorizing Provider  Amino Acids-Protein Hydrolys (FEEDING SUPPLEMENT, PRO-STAT SUGAR FREE 64,) LIQD Place 30 mLs into feeding tube daily. 02/14/16  Yes Ripudeep Krystal Eaton, MD  clonazePAM (KLONOPIN) 0.5 MG tablet Place 1 tablet (0.5 mg total) into feeding tube every 6 (six) hours. Give 1 tablet vie peg tube every 6 hours for cerebral palsy Patient taking differently: Place 1 mg into feeding tube every 6 (six) hours. Give 1 tablet vie peg tube every 8 hours for cerebral palsy 02/14/16  Yes Ripudeep Krystal Eaton, MD  docusate (COLACE) 50 MG/5ML liquid Place 20 mLs (200 mg total) into feeding tube 2 (two) times daily. 06/09/14  Yes Bonnielee Haff, MD  famotidine (PEPCID) 20 MG tablet Give 1 tablet via peg tube daily ( 20 mg )   Yes Historical Provider, MD  furosemide (LASIX) 20 MG tablet 20 mg by PEG Tube route 2 (two) times daily.   Yes Historical Provider, MD  levETIRAcetam (KEPPRA) 100 MG/ML solution Place 5 mLs (500 mg total) into feeding tube daily at 6 (six) AM. 06/09/14  Yes  Bonnielee Haff, MD  levETIRAcetam (KEPPRA) 100 MG/ML solution Place 750 mg into feeding tube daily. 2 p and 8 p   Yes Historical Provider, MD  levothyroxine (SYNTHROID, LEVOTHROID) 100 MCG tablet 100 mcg by PEG Tube route daily before breakfast.   Yes Historical Provider, MD  metoprolol tartrate (LOPRESSOR) 25 MG tablet 12.5 mg by PEG Tube route 3 (three) times daily.   Yes Historical Provider, MD  Multiple Vitamins-Minerals (CENTAMIN) LIQD Place 5 mLs into feeding tube daily.   Yes Historical Provider, MD  Nutritional Supplements (FEEDING SUPPLEMENT, JEVITY 1.5 CAL/FIBER,) LIQD Place 1,000 mLs into feeding tube continuous. 02/14/16  Yes Ripudeep Krystal Eaton, MD  potassium chloride 20 MEQ/15ML (10%) SOLN 20 mEq by PEG Tube route daily. Dilute with water before administered.   Yes Historical Provider, MD  Valproic Acid (DEPAKENE) 250 MG/5ML SYRP  syrup Place 15 mLs (750 mg total) into feeding tube 3 (three) times daily. 06/09/14  Yes Bonnielee Haff, MD  amoxicillin-clavulanate (AUGMENTIN) 250-62.5 MG/5ML suspension Place 10 mLs (500 mg total) into feeding tube 2 (two) times daily. X 3 more days Patient not taking: Reported on 04/12/2016 02/14/16   Ripudeep K Rai, MD   BP 131/97 mmHg  Pulse 93  Temp(Src) 97.6 F (36.4 C)  Resp 20  SpO2 94%   Physical Exam  General: Well-developed, well-nourished female in no acute distress; appearance consistent with age of record HENT: normocephalic; atraumatic; edentulous Eyes: Severe cataracts bilaterally Neck: supple Heart: regular rate and rhythm Lungs: clear to auscultation bilaterally Abdomen: soft; nondistended; bowel sounds present; gastrostomy left upper abdomen Extremities: Contractures of lower extremities; pulses normal Neurologic: Monos to noxious stimuli; nonverbal; noted to move all extremities Skin: Warm and dry    ED Course  Procedures (including critical care time)  G-TUBE REPLACEMENT The patient's gastrostomy site was prepped with Betadine and 2% lidocaine gel. An attempt was made to pass a 20 [place] gastrostomy tube without success. The ostomy was probed with a cotton swab which was able to be passed into the stomach. An attempt was then made to pass a 45 French gastrostomy tube again without success. Finally a 61 French Foley catheter was able to be placed into the ostomy and the cuff inflated with 10 milliliters of water. A KUB with contrast was performed to verify placement. The patient tolerated this well and there were no immediate complications apart from minor bleeding.  MDM  Nursing notes and vitals signs, including pulse oximetry, reviewed.  Summary of this visit's results, reviewed by myself:  Imaging Studies: Dg Abd 1 View  04/12/2016  CLINICAL DATA:  Percutaneous gastrostomy tube location EXAM: ABDOMEN - 1 VIEW COMPARISON:  09/13/2015 FINDINGS: Contrast  injected into the percutaneous gastrostomy flows freely into the gastric lumen and into the duodenum. There is no leakage of contrast outside the stomach or outside the tubing. IMPRESSION: The percutaneous gastrostomy appears to be satisfactorily positioned, with all injected contrast flowing into the gastric lumen. Electronically Signed   By: Andreas Newport M.D.   On: 04/12/2016 03:41        Shanon Rosser, MD 04/12/16 [phone]

## 2016-04-12 NOTE — Progress Notes (Signed)
Location:   [place] Room Number: 205/W Place of Service:  SNF (31) Provider:  Henreitta Leber, MD  Patient Care Team: Hennie Duos, MD as PCP - General (Internal Medicine)  Extended Emergency Contact Information Primary Emergency Contact: Sellars,[name] Address: [place]          [place] [address] [place] Phone: [phone] Relation: Sister Secondary Emergency Contact: [place] of [place] Phone: [phone] Relation: None  Code Status:  DNR Goals of care: Advanced Directive information Advanced Directives 04/12/2016  Does patient have an advance directive? Yes  Type of Advance Directive Out of facility DNR (pink MOST or yellow form)  Does patient want to make changes to advanced directive? No - Patient declined  Copy of advanced directive(s) in chart? Yes     Chief Complaint  Patient presents with  . Follow-up    Follow-up from ER    HPI:  Pt is a 70 y.o. female seen today for an acute visit for Recent  ER visit secondary to PEG tube placement--Apparently patient's PEG tube was found to be loose with some bleeding and facility and was sent to the ER were physical exam apparently was fairly benign and PEG was replaced with a Foley catheter.  She has returned to the facility and apparently this is stabilized-I do not see evidence of bleeding today or discomfort.  Patient has severe dementia and is an 8 largely vegetative state-this is complicated with a history of mental retardation.  These appear to be at baseline-she does have a history of atrial fibrillation this appears rate controlled on Lopressor 12.5 mg 3 times a day.  She also has a history of diastolic CHF which is stable on Lasix.  I note the hospital she did have hypokalemia we will recheck a metabolic panel on [id number] potassium was 3.7 she is on supplementation-again she is also on Lasix 20 mg twice a day.  I do note  sodium was 147 on lab done in early May as well we will update this as well.     Past Medical History  Diagnosis Date  . Seizures ([place])   . Osteoporosis   . Thyroid disease     hypothyroid  . High cholesterol   . Cataracts, bilateral   . Optic atrophy   . Paraparesis (HCC)     mild  . Hypothyroidism   . Anginal pain ([place])   . Coronary artery disease   . CHF (congestive heart failure) ([place])   . Paroxysmal atrial fibrillation (HCC)   . Dysphagia   . Unspecified convulsions ([place])   . Dementia   . Optic atrophy   . Hypotension   . Anemia   . Hyperlipidemia   . Cataract   . Dental caries   . Esophageal reflux   . Sepsis ([place]) 01/2016   Past Surgical History  Procedure Laterality Date  . Midline incision    . Central venous catheter insertion  05/15/2014       . Esophagogastroduodenoscopy N/A 06/03/2014    Procedure: ESOPHAGOGASTRODUODENOSCOPY (EGD);  Surgeon: Beryle Beams, MD;  Location: [facility] ENDOSCOPY;  Service: Endoscopy;  Laterality: N/A;  . Gastrostomy tube placement      Allergies  Allergen Reactions  . Carvedilol   . Ppd [Tuberculin Purified Protein Derivative]     Per [facility]      Medication List       This list is accurate as of: 04/12/16  2:10 PM.  Always use your most recent med list.               CENTAMIN Liqd  Place 5 mLs into feeding tube daily.     clonazePAM 0.5 MG tablet  Commonly known as:  KLONOPIN  Place 1 tablet ( 0.5 mg total ) into feeding tube every 6 hours     docusate 50 MG/5ML liquid  Commonly known as:  COLACE  Place 20 mLs (200 mg total) into feeding tube 2 (two) times daily.     famotidine 20 MG tablet  Commonly known as:  PEPCID  Give 1 tablet via peg tube daily ( 20 mg )     feeding supplement (PRO-STAT SUGAR FREE 64) Liqd  Place 30 mLs into feeding tube daily.     furosemide 20 MG tablet  Commonly known as:  LASIX  20 mg by PEG Tube route 2 (two) times daily.     levETIRAcetam 100 MG/ML solution  Commonly known as:   KEPPRA  Place 750 mg into feeding tube daily. 2 p and 8 p     levETIRAcetam 100 MG/ML solution  Commonly known as:  KEPPRA  Place 5 mLs (500 mg total) into feeding tube daily at 6 (six) AM.     levothyroxine 100 MCG tablet  Commonly known as:  SYNTHROID, LEVOTHROID  100 mcg by PEG Tube route daily before breakfast.     metoprolol tartrate 25 MG tablet  Commonly known as:  LOPRESSOR  12.5 mg by PEG Tube route 3 (three) times daily.     potassium chloride 20 MEQ/15ML (10%) Soln  20 mEq by PEG Tube route daily. Dilute with water before administered.     Valproic Acid 250 MG/5ML Syrp syrup  Commonly known as:  DEPAKENE  Place 15 mLs (750 mg total) into feeding tube 3 (three) times daily.        Review of Systems   Per staff patient appears to be at baseline other than PEG tube removal no recent acute issues she was recently hospitalized for pneumonia and has been relatively stable the past several weeks according to nursing has tolerated tube feedings well  Immunization History  Administered Date(s) Administered  . Influenza-Unspecified 08/13/2014   Pertinent  Health Maintenance Due  Topic Date Due  . MAMMOGRAM  04/12/2017 (Originally 02/17/2016)  . PNA vac Low Risk Adult (1 of 2 - PCV13) 04/12/2017 (Originally 06/28/2011)  . COLONOSCOPY  04/12/2026 (Originally 06/27/1996)  . INFLUENZA VACCINE  05/15/2016  . DEXA SCAN  Completed   No flowsheet data found. Functional Status Survey:    Filed Vitals:   04/12/16 1356  BP: 100/69  Pulse: 88  Temp: 97.3 F (36.3 C)  TempSrc: Axillary  Resp: 18   There is no weight on file to calculate BMI. Physical Exam   In general this is a frail elderly female who is nonresponsive does have some involuntary upper body movements-this is baseline.  There is no sign of distress.  Her skin is warm and dry.  Heart is regular rate and rhythm in the high 90s there does not appear to be significant lower extremity edema.  Chest is clear  to auscultation no labored breathing.  Abdomen is somewhat obese soft nontender with positive bowel sounds PEG site appears unremarkable Foley catheter has been placed and do not see drainage or bleeding.  Muscle skeletal does have contractures upper and lower extremities at baseline at times does have involuntary movement of her  upper extremities.  Neurologic as noted above she continues in a vegetative state with some movement of her upper extremities at times.  Psych findings compatible with severe dementia again essentially vegetative state which is baseline  Labs reviewed:  Recent Labs  02/11/16 0455 02/12/16 0355 02/12/16 1109 02/12/16 1542 02/13/16 0405  NA 144 143  --   --  147*  K 3.1* 2.7*  --  3.6 3.7  CL 107 104  --   --  108  CO2 24 30  --   --  31  GLUCOSE 132* 101*  --   --  149*  BUN 11 12  --   --  10  CREATININE 0.55 0.58  --   --  0.66  CALCIUM 8.3* 8.1*  --   --  8.5*  MG  --   --  2.0 2.0  --     Recent Labs  02/08/16 1021  AST 29  ALT 17  ALKPHOS 96  BILITOT 0.6  PROT 7.4  ALBUMIN 2.5*    Recent Labs  02/08/16 1021 02/09/16 0348 02/11/16 0455 02/13/16 0405  WBC 23.4* 17.1* 13.8* 12.9*  NEUTROABS 17.6*  --   --   --   HGB 15.5* 12.4 10.4* 10.5*  HCT 45.9 38.3 32.8* 32.0*  MCV 95.4 95.8 97.0 98.8  PLT 160 160 186 223   Lab Results  Component Value Date   TSH 3.04 12/27/2014   No results found for: HGBA1C Lab Results  Component Value Date   CHOL 117 07/27/2014   HDL 22* 07/27/2014   LDLCALC 72 07/27/2014   TRIG 116 07/27/2014    Significant Diagnostic Results in last 30 days:  Dg Abd 1 View  04/12/2016  CLINICAL DATA:  Percutaneous gastrostomy tube location EXAM: ABDOMEN - 1 VIEW COMPARISON:  09/13/2015 FINDINGS: Contrast injected into the percutaneous gastrostomy flows freely into the gastric lumen and into the duodenum. There is no leakage of contrast outside the stomach or outside the tubing. IMPRESSION: The percutaneous  gastrostomy appears to be satisfactorily positioned, with all injected contrast flowing into the gastric lumen. Electronically Signed   By: Andreas Newport M.D.   On: 04/12/2016 03:41    Assessment/Plan  1 history of dysphagia with PEG tube removal and replacement-at this point has aFoley  catheter will write an order for radiology appointment for patient to have PEG tube placed-appears to be tolerating her feedings well at this point I do not see evidence of bleeding or drainage of significance.  #2 history of atrial fibrillation this appears rate controlled she is on Lopressor-at times she has pulses about 100 I don't believe this is consistent.  #3-history of pneumonia-at this point she appears to be stable she is afebrile I did not appreciate significant cough or congestion this will have to be monitored.  #4 history of hypokalemia she is on supplementation Will update a metabolic panel to keep eye on her potassium level as well as sodium level which most recent lab was slightly   elevated at 147  [id number] Of note greater than 25 minutes spent assessing patient discussing her status with nursing staff reviewing her ER visit notes-reviewing labs-and coordinating and formulating plan of care-of note greater than 50% of time spent coordinating plan of care with chart review       Oralia Manis, [place]

## 2016-04-12 NOTE — ED Notes (Signed)
Bed: WA09 Expected date:  Expected time:  Means of arrival:  Comments: Gtube dislodged

## 2016-04-12 NOTE — ED Notes (Signed)
MD was at bedside for procedure. Unable to place G tube, 12 FR foley placed, pt to be transported to xray to confirm placement of the same.

## 2016-04-16 ENCOUNTER — Non-Acute Institutional Stay (SKILLED_NURSING_FACILITY): Payer: Medicare Other | Admitting: Internal Medicine

## 2016-04-16 ENCOUNTER — Encounter: Payer: Self-pay | Admitting: Internal Medicine

## 2016-04-16 DIAGNOSIS — F72 Severe intellectual disabilities: Secondary | ICD-10-CM | POA: Diagnosis not present

## 2016-04-16 DIAGNOSIS — D72829 Elevated white blood cell count, unspecified: Secondary | ICD-10-CM | POA: Diagnosis not present

## 2016-04-16 LAB — HEPATIC FUNCTION PANEL
ALT: 13 U/L (ref 7–35)
AST: 17 U/L (ref 13–35)
Alkaline Phosphatase: 124 U/L (ref 25–125)
Bilirubin, Total: 0.3 mg/dL

## 2016-04-16 LAB — BASIC METABOLIC PANEL
BUN: 18 mg/dL (ref 4–21)
CREATININE: 0.4 mg/dL — AB (ref 0.5–1.1)
GLUCOSE: 116 mg/dL
POTASSIUM: 4.2 mmol/L (ref 3.4–5.3)
Sodium: 136 mmol/L — AB (ref 137–147)

## 2016-04-16 LAB — CBC AND DIFFERENTIAL
HCT: 34 % — AB (ref 36–46)
Hemoglobin: 11.2 g/dL — AB (ref 12.0–16.0)
PLATELETS: 353 10*3/uL (ref 150–399)
WBC: 16.1 10*3/mL

## 2016-04-16 NOTE — Progress Notes (Signed)
Location:   [place] Room Number: 205/W Place of Service:  SNF [phone]) Provider:  Henreitta Leber, MD  Patient Care Team: Hennie Duos, MD as PCP - General (Internal Medicine)  Extended Emergency Contact Information Primary Emergency Contact: Sellars,[name] Address: [address]          [place] [address] [place] Phone: [phone] Relation: Sister Secondary Emergency Contact: [place] of [place] Phone: [phone] Relation: None  Code Status:  DNR Goals of care: Advanced Directive information Advanced Directives 04/16/2016  Does patient have an advance directive? Yes  Type of Advance Directive Out of facility DNR (pink MOST or yellow form)  Does patient want to make changes to advanced directive? No - Patient declined  Copy of advanced directive(s) in chart? Yes     Chief Complaint  Patient presents with  . Acute Visit    Elevated WBC    HPI:  Pt is a 70 y.o. female seen today for an acute visit for An elevated white blood cell count of 16,100.  Patient does have a history of pneumonia and UTIs.  However she is afebrile and nursing staff does not report any congestion or coughing.  Clinically appears to be at baseline.  She recently went to the ER and had her PEG tube replaced with a Foley catheter-there are orders for outpatient follow-up for placement of a more permanent tube.  Currently nursing staff does not report any acute issues however white count rising is a concern.  Patient is a in a vegetative state and cannot really give any review of systems.  I do note in the hospital she was treated with vancomycin and Zosyn at one point.   Past Medical History  Diagnosis Date  . Seizures ([place])   . Osteoporosis   . Thyroid disease     hypothyroid  . High cholesterol   . Cataracts, bilateral   . Optic atrophy   . Paraparesis (HCC)     mild  . Hypothyroidism   . Anginal  pain ([place])   . Coronary artery disease   . CHF (congestive heart failure) ([place])   . Paroxysmal atrial fibrillation (HCC)   . Dysphagia   . Unspecified convulsions ([place])   . Dementia   . Optic atrophy   . Hypotension   . Anemia   . Hyperlipidemia   . Cataract   . Dental caries   . Esophageal reflux   . Sepsis ([place]) 01/2016   Past Surgical History  Procedure Laterality Date  . Midline incision    . Central venous catheter insertion  05/15/2014       . Esophagogastroduodenoscopy N/A 06/03/2014    Procedure: ESOPHAGOGASTRODUODENOSCOPY (EGD);  Surgeon: Beryle Beams, MD;  Location: [facility] ENDOSCOPY;  Service: Endoscopy;  Laterality: N/A;  . Gastrostomy tube placement      Allergies  Allergen Reactions  . Carvedilol   . Ppd [Tuberculin Purified Protein Derivative]     Per [facility]      Medication List       This list is accurate as of: 04/16/16  2:56 PM.  Always use your most recent med list.               CENTAMIN Liqd  Place 5 mLs into feeding tube daily.     clonazePAM 0.5 MG tablet  Commonly known as:  KLONOPIN  Place 1 tablet ( 0.5 mg total ) into feeding tube  every 6 hours     docusate 50 MG/5ML liquid  Commonly known as:  COLACE  Place 20 mLs (200 mg total) into feeding tube 2 (two) times daily.     famotidine 20 MG tablet  Commonly known as:  PEPCID  Give 1 tablet via peg tube daily ( 20 mg )     feeding supplement (PRO-STAT SUGAR FREE 64) Liqd  Place 30 mLs into feeding tube daily.     furosemide 20 MG tablet  Commonly known as:  LASIX  20 mg by PEG Tube route 2 (two) times daily.     levETIRAcetam 100 MG/ML solution  Commonly known as:  KEPPRA  Place 750 mg into feeding tube daily. 2 p and 8 p     levETIRAcetam 100 MG/ML solution  Commonly known as:  KEPPRA  Place 5 mLs (500 mg total) into feeding tube daily at 6 (six) AM.     levothyroxine 100 MCG tablet  Commonly known as:  SYNTHROID, LEVOTHROID  100 mcg by PEG Tube route daily before breakfast.       metoprolol tartrate 25 MG tablet  Commonly known as:  LOPRESSOR  12.5 mg by PEG Tube route 3 (three) times daily.     potassium chloride 20 MEQ/15ML (10%) Soln  20 mEq by PEG Tube route daily. Dilute with water before administered.     Valproic Acid 250 MG/5ML Syrp syrup  Commonly known as:  DEPAKENE  Place 15 mLs (750 mg total) into feeding tube 3 (three) times daily.        Review of Systems   Essentially unattainable as noted above  Immunization History  Administered Date(s) Administered  . Influenza-Unspecified 08/13/2014   Pertinent  Health Maintenance Due  Topic Date Due  . MAMMOGRAM  04/12/2017 (Originally 02/17/2016)  . PNA vac Low Risk Adult (1 of 2 - PCV13) 04/12/2017 (Originally 06/28/2011)  . COLONOSCOPY  04/12/2026 (Originally 06/27/1996)  . INFLUENZA VACCINE  05/15/2016  . DEXA SCAN  Completed   No flowsheet data found. Functional Status Survey:    Filed Vitals:   04/16/16 1447  BP: 130/75  Pulse: 68  Temp: 98.2 F (36.8 C)  TempSrc: Oral  Resp: 16   There is no weight on file to calculate BMI. Physical Exam   In general this is an elderly female in a largely unresponsive state she does have some spontaneous movement which is her baseline she does not really respond to any verbal commands.  She appears to be comfortable.  Her skin is warm and dry.  Heart is regular rate and rhythm without murmur gallop or rub she does not have significant lower extremity edema.  Chest is clear to auscultation there is no labored breathing is shallow air entry which is baseline.  Abdomen continues to be somewhat obese it is nontender with positive bowel sounds PEG site appears to be unremarkable I do not see drainage or bleeding surrounding the area.  GU catheter is draining amber colored urine.  Musculoskeletal has contractures upper and lower extremities at baseline has some spontaneous movement of her upper extremities at times.  Neurologic is no above she  is in a vegetative state with mild some movement of her upper extremities and facial expressions at times.  Psych as noted above.    Labs reviewed:  Recent Labs  02/11/16 0455 02/12/16 0355 02/12/16 1109 02/12/16 1542 02/13/16 0405  NA 144 143  --   --  147*  K 3.1* 2.7*  --  3.6 3.7  CL 107 104  --   --  108  CO2 24 30  --   --  31  GLUCOSE 132* 101*  --   --  149*  BUN 11 12  --   --  10  CREATININE 0.55 0.58  --   --  0.66  CALCIUM 8.3* 8.1*  --   --  8.5*  MG  --   --  2.0 2.0  --     Recent Labs  02/08/16 1021  AST 29  ALT 17  ALKPHOS 96  BILITOT 0.6  PROT 7.4  ALBUMIN 2.5*    Recent Labs  02/08/16 1021 02/09/16 0348 02/11/16 0455 02/13/16 0405  WBC 23.4* 17.1* 13.8* 12.9*  NEUTROABS 17.6*  --   --   --   HGB 15.5* 12.4 10.4* 10.5*  HCT 45.9 38.3 32.8* 32.0*  MCV 95.4 95.8 97.0 98.8  PLT 160 160 186 223   Lab Results  Component Value Date   TSH 3.04 12/27/2014   No results found for: HGBA1C Lab Results  Component Value Date   CHOL 117 07/27/2014   HDL 22* 07/27/2014   LDLCALC 72 07/27/2014   TRIG 116 07/27/2014    Significant Diagnostic Results in last 30 days:  Dg Abd 1 View  04/12/2016  CLINICAL DATA:  Percutaneous gastrostomy tube location EXAM: ABDOMEN - 1 VIEW COMPARISON:  09/13/2015 FINDINGS: Contrast injected into the percutaneous gastrostomy flows freely into the gastric lumen and into the duodenum. There is no leakage of contrast outside the stomach or outside the tubing. IMPRESSION: The percutaneous gastrostomy appears to be satisfactorily positioned, with all injected contrast flowing into the gastric lumen. Electronically Signed   By: Andreas Newport M.D.   On: 04/12/2016 03:41    Assessment/Plan  Leukocytosis of unclear etiology-clinically she does not give a septic appearance she appears to be stable will get a stat CBC with differential and basic metabolic panel.  Also will update a chest x-ray two-view as well as a  urinalysis and culture.  For the time being will monitor vital signs pulse ox every shift.  We will withhold antibiotics secondary to patient does not appear to be ill-will await results of update CBC and monitor clinically for any signs and symptoms of infection.  [place], [place], [place]

## 2016-04-17 LAB — CBC AND DIFFERENTIAL
HEMATOCRIT: 35 % — AB (ref 36–46)
Hemoglobin: 11.6 g/dL — AB (ref 12.0–16.0)
PLATELETS: 399 10*3/uL (ref 150–399)
WBC: 12.6 10^3/mL

## 2016-04-17 LAB — BASIC METABOLIC PANEL
BUN: 15 mg/dL (ref 4–21)
CREATININE: 0.5 mg/dL (ref <=1.1)
Glucose: 85 mg/dL
Potassium: 5.2 mmol/L (ref 3.4–5.3)
SODIUM: 139 mmol/L (ref 137–147)

## 2016-04-18 ENCOUNTER — Other Ambulatory Visit: Payer: Self-pay | Admitting: Internal Medicine

## 2016-04-18 ENCOUNTER — Other Ambulatory Visit: Payer: Self-pay | Admitting: Radiology

## 2016-04-18 DIAGNOSIS — R633 Feeding difficulties, unspecified: Secondary | ICD-10-CM

## 2016-04-19 ENCOUNTER — Ambulatory Visit (HOSPITAL_COMMUNITY)
Admission: RE | Admit: 2016-04-19 | Discharge: 2016-04-19 | Disposition: A | Discharge status: Home / Self Care | Payer: Medicare Other | Source: Ambulatory Visit | Attending: Internal Medicine | Admitting: Internal Medicine

## 2016-04-19 ENCOUNTER — Encounter: Payer: Self-pay | Admitting: Internal Medicine

## 2016-04-19 ENCOUNTER — Non-Acute Institutional Stay (SKILLED_NURSING_FACILITY): Payer: Medicare Other | Admitting: Internal Medicine

## 2016-04-19 DIAGNOSIS — E875 Hyperkalemia: Secondary | ICD-10-CM

## 2016-04-19 DIAGNOSIS — D72829 Elevated white blood cell count, unspecified: Secondary | ICD-10-CM

## 2016-04-19 DIAGNOSIS — R633 Feeding difficulties, unspecified: Secondary | ICD-10-CM

## 2016-04-19 DIAGNOSIS — Z431 Encounter for attention to gastrostomy: Secondary | ICD-10-CM | POA: Insufficient documentation

## 2016-04-19 MED ORDER — IOPAMIDOL (ISOVUE-300) INJECTION 61%
INTRAVENOUS | Status: DC | PRN
Start: 2016-04-19 — End: 2016-04-20
  Administered 2016-04-19: 10 mL via INTRAVENOUS

## 2016-04-19 NOTE — Progress Notes (Signed)
Location:   [facility] Room Number: 205/W Place of Service:  SNF [phone]) Provider:  Henreitta Leber, MD  Patient Care Team: Hennie Duos, MD as PCP - General (Internal Medicine)  Extended Emergency Contact Information Primary Emergency Contact: Sellars,[name] Address: [address] [place]          [place] [address] [place] Phone: [phone] Relation: Sister Secondary Emergency Contact: [place] Phone: [phone] Relation: None  Code Status:  DNR Goals of care: Advanced Directive information Advanced Directives 04/19/2016  Does patient have an advance directive? Yes  Type of Advance Directive Out of facility DNR (pink MOST or yellow form)  Does patient want to make changes to advanced directive? No - Patient declined  Copy of advanced directive(s) in chart? Yes     Acute visit follow-up leukocytosis  HPI:  Pt is a 70 y.o. female seen today for an acute visit for follow-up of elevated white count of over 16,000.  Patient does have a history of pneumonia and UTIs-we did order a chest x-ray which actually came back showing no acute process.  A urine culture is pending  On most recent lab on July 4 white count is down to 12,600 with no aggressive intervention.  She is asymptomatic of any sepsis afebrile not tachycardic appears to be at her baseline.  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  She is PEG tube dependent actually this was replaced secondary to bleeding with a Foley catheter in the ER-she has subsequently had this replaced with another PEG tube and apparently tolerated the procedure well.  Currently she is resting in bed comfortably vital signs are stable she is afebrile    Past Medical History  Diagnosis Date  . Seizures ([place])   . Osteoporosis   . Thyroid disease     hypothyroid  . High cholesterol   .  Cataracts, bilateral   . Optic atrophy   . Paraparesis (HCC)     mild  . Hypothyroidism   . Anginal pain ([place])   . Coronary artery disease   . CHF (congestive heart failure) ([place])   . Paroxysmal atrial fibrillation (HCC)   . Dysphagia   . Unspecified convulsions ([place])   . Dementia   . Optic atrophy   . Hypotension   . Anemia   . Hyperlipidemia   . Cataract   . Dental caries   . Esophageal reflux   . Sepsis ([place]) 01/2016   Past Surgical History  Procedure Laterality Date  . Midline incision    . Central venous catheter insertion  05/15/2014       . Esophagogastroduodenoscopy N/A 06/03/2014    Procedure: ESOPHAGOGASTRODUODENOSCOPY (EGD);  Surgeon: Beryle Beams, MD;  Location: [facility] ENDOSCOPY;  Service: Endoscopy;  Laterality: N/A;  . Gastrostomy tube placement      Allergies  Allergen Reactions  . Carvedilol   . Ppd [Tuberculin Purified Protein Derivative]     Per [facility]      Medication List       This list is accurate as of: 04/19/16  4:50 PM.  Always use your most recent med list.               CENTAMIN Liqd  Place 5 mLs into feeding tube daily.     clonazePAM 0.5 MG tablet  Commonly known as:  [place] 1  tablet ( 0.5 mg total ) into feeding tube every 6 hours     docusate 50 MG/5ML liquid  Commonly known as:  COLACE  Place 20 mLs (200 mg total) into feeding tube 2 (two) times daily.     famotidine 20 MG tablet  Commonly known as:  PEPCID  Give 1 tablet via peg tube daily ( 20 mg )     feeding supplement (PRO-STAT SUGAR FREE 64) Liqd  Place 30 mLs into feeding tube daily.     furosemide 20 MG tablet  Commonly known as:  LASIX  20 mg by PEG Tube route 2 (two) times daily.     levETIRAcetam 100 MG/ML solution  Commonly known as:  KEPPRA  Place 750 mg into feeding tube daily. 2 p and 8 p     levETIRAcetam 100 MG/ML solution  Commonly known as:  KEPPRA  Place 5 mLs (500 mg total) into feeding tube daily at 6 (six) AM.     levothyroxine 100 MCG  tablet  Commonly known as:  SYNTHROID, LEVOTHROID  100 mcg by PEG Tube route daily before breakfast.     metoprolol tartrate 25 MG tablet  Commonly known as:  LOPRESSOR  12.5 mg by PEG Tube route 3 (three) times daily.     potassium chloride 20 MEQ/15ML (10%) Soln  20 mEq by PEG Tube route daily. Dilute with water before administered.     Valproic Acid 250 MG/5ML Syrp syrup  Commonly known as:  DEPAKENE  Place 15 mLs (750 mg total) into feeding tube 3 (three) times daily.        Review of Systems   Please see history of present illness again there is been no noted cough congestion fever chills or chest congestion or vaginal discharge  Immunization History  Administered Date(s) Administered  . Influenza-Unspecified 08/13/2014   Pertinent  Health Maintenance Due  Topic Date Due  . MAMMOGRAM  04/12/2017 (Originally 02/17/2016)  . PNA vac Low Risk Adult (1 of 2 - PCV13) 04/12/2017 (Originally 06/28/2011)  . COLONOSCOPY  04/12/2026 (Originally 06/27/1996)  . INFLUENZA VACCINE  05/15/2016  . DEXA SCAN  Completed   No flowsheet data found. Functional Status Survey:    Filed Vitals:   04/19/16 1649  BP: 134/67  Pulse: 78  Temp: 97.6 F (36.4 C)  TempSrc: Oral  Resp: 18   There is no weight on file to calculate BMI. Physical Exam  In general this elderly female in no distress she is in the largely unresponsive state which is her baseline with some spontaneous movement of her upper extremities at times  Her skin is warm and dry.  Heart is regular rate and rhythm without murmur gallop or rub she has minimal lower extremity edema. Eyes sclera and conjunctiva appear to be relatively clear  Oropharynx could not be assessed secondary patient holding her mouth shut.  Chest is clear to auscultation I could not appreciate any labored breathing.  Abdomen is soft nontender with positive bowel sounds PEG site appears unremarkable without drainage or bleeding.  Musculoskeletal  baseline with some spontaneous movement of her upper extremities does have contractures most mostly of her lower extremities.  Her logic as stated above.  Psych again she is in a largely unresponsive state which is her baseline with a history of severe mental retardation.      Labs reviewed:  Recent Labs  02/11/16 0455 02/12/16 0355 02/12/16 1109 02/12/16 1542 02/13/16 0405 04/17/16  NA 144 143  --   --  147* 139  K 3.1* 2.7*  --  3.6 3.7 5.2  CL 107 104  --   --  108  --   CO2 24 30  --   --  31  --   GLUCOSE 132* 101*  --   --  149*  --   BUN 11 12  --   --  10 15  CREATININE 0.55 0.58  --   --  0.66 0.5  CALCIUM 8.3* 8.1*  --   --  8.5*  --   MG  --   --  2.0 2.0  --   --     Recent Labs  02/08/16 1021  AST 29  ALT 17  ALKPHOS 96  BILITOT 0.6  PROT 7.4  ALBUMIN 2.5*    Recent Labs  02/08/16 1021 02/09/16 0348 02/11/16 0455 02/13/16 0405 04/17/16  WBC 23.4* 17.1* 13.8* 12.9* 12.6  NEUTROABS 17.6*  --   --   --   --   HGB 15.5* 12.4 10.4* 10.5* 11.6*  HCT 45.9 38.3 32.8* 32.0* 35*  MCV 95.4 95.8 97.0 98.8  --   PLT 160 160 186 223 399   Lab Results  Component Value Date   TSH 3.04 12/27/2014   No results found for: HGBA1C Lab Results  Component Value Date   CHOL 117 07/27/2014   HDL 22* 07/27/2014   LDLCALC 72 07/27/2014   TRIG 116 07/27/2014    Significant Diagnostic Results in last 30 days:  Dg Abd 1 View  04/12/2016  CLINICAL DATA:  Percutaneous gastrostomy tube location EXAM: ABDOMEN - 1 VIEW COMPARISON:  09/13/2015 FINDINGS: Contrast injected into the percutaneous gastrostomy flows freely into the gastric lumen and into the duodenum. There is no leakage of contrast outside the stomach or outside the tubing. IMPRESSION: The percutaneous gastrostomy appears to be satisfactorily positioned, with all injected contrast flowing into the gastric lumen. Electronically Signed   By: Andreas Newport M.D.   On: 04/12/2016 03:41   Ir Replc  Gastro/colonic Tube Percut W/fluoro  04/19/2016  CLINICAL DATA:  Inadvertent removal of gastrostomy tube. Eighteen peripherally catheter was placed through the tract. EXAM: PERCUTANEOUS GASTROSTOMY CATHETER EXCHANGE UNDER FLUOROSCOPY FLUOROSCOPY TIME:  Less than 6 seconds TECHNIQUE: The gastrostomy site was prepped in usual sterile fashion. A new 5 French balloon retention gastrostomy catheter was advanced easily through the tract. The retention balloon was inflated. Contrast injection confirms appropriate positioning in the gastric lumen. The stomach is decompressed. Contrast flows on into the proximal duodenum. The patient tolerated the procedure well. Complications: None. IMPRESSION: 1. Technically successful exchange of 20 French balloon retention percutaneous gastrostomy catheter under fluoroscopy Electronically Signed   By: Lucrezia Europe M.D.   On: 04/19/2016 14:15    Assessment/Plan Leukocytosis-this appears to be trending down at 12,600 on most recent lab she continues to show no signs of infection chest x-ray has been negative urine culture is pending although I suspect this will be negative-will await those results-also update a CBC with differential as well as a metabolic panel early next week.  Number 2I do note on metabolic panel done earlier this week shows potassium was high normal at 5.2 she is on potassium 20 mEq a day will reduce this to 10 mEq a day and again recheck a metabolic panel next week.  [place], [place], [place]

## 2016-04-24 LAB — BASIC METABOLIC PANEL
BUN: 24 mg/dL — AB (ref 4–21)
Creatinine: 0.5 mg/dL (ref 0.5–1.1)
GLUCOSE: 90 mg/dL
Potassium: 4.7 mmol/L (ref 3.4–5.3)
SODIUM: 136 mmol/L — AB (ref 137–147)

## 2016-04-24 LAB — CBC AND DIFFERENTIAL
HEMATOCRIT: 37 % (ref 36–46)
HEMOGLOBIN: 12.3 g/dL (ref 12.0–16.0)
PLATELETS: 390 10*3/uL (ref 150–399)
WBC: 9.2 10^3/mL

## 2016-05-12 ENCOUNTER — Emergency Department (HOSPITAL_COMMUNITY): Payer: Medicare Other

## 2016-05-12 ENCOUNTER — Emergency Department (HOSPITAL_COMMUNITY)
Admission: EM | Admit: 2016-05-12 | Discharge: 2016-05-12 | Disposition: A | Discharge status: Home / Self Care | Payer: Medicare Other | Attending: Emergency Medicine | Admitting: Emergency Medicine

## 2016-05-12 ENCOUNTER — Encounter (HOSPITAL_COMMUNITY): Payer: Self-pay | Admitting: Family Medicine

## 2016-05-12 DIAGNOSIS — Z431 Encounter for attention to gastrostomy: Secondary | ICD-10-CM

## 2016-05-12 DIAGNOSIS — T85528A Displacement of other gastrointestinal prosthetic devices, implants and grafts, initial encounter: Secondary | ICD-10-CM

## 2016-05-12 DIAGNOSIS — I2510 Atherosclerotic heart disease of native coronary artery without angina pectoris: Secondary | ICD-10-CM | POA: Diagnosis not present

## 2016-05-12 DIAGNOSIS — Z79899 Other long term (current) drug therapy: Secondary | ICD-10-CM | POA: Insufficient documentation

## 2016-05-12 DIAGNOSIS — E039 Hypothyroidism, unspecified: Secondary | ICD-10-CM | POA: Insufficient documentation

## 2016-05-12 DIAGNOSIS — I509 Heart failure, unspecified: Secondary | ICD-10-CM | POA: Diagnosis not present

## 2016-05-12 DIAGNOSIS — I252 Old myocardial infarction: Secondary | ICD-10-CM | POA: Insufficient documentation

## 2016-05-12 DIAGNOSIS — K9423 Gastrostomy malfunction: Secondary | ICD-10-CM | POA: Diagnosis not present

## 2016-05-12 MED ORDER — DIATRIZOATE MEGLUMINE & SODIUM 66-10 % PO SOLN
ORAL | Status: AC
  Administered 2016-05-12: 20 mL via GASTROSTOMY
  Filled 2016-05-12: qty 30

## 2016-05-12 NOTE — ED Provider Notes (Signed)
[facility] DEPT Provider Note   CSN: [id number] Arrival date & time: 05/12/16  [id number]  First Provider Contact:  First MD Initiated Contact with Patient 05/12/16 [phone]     LEVEL 5 CAVEAT - CEREBRAL [facility]   History   Chief Complaint Chief Complaint  Patient presents with  . Other    pulled out g tube    HPI Dawn [name] is a 70 y.o. female presenting from her facility after pulling out her G-tube. History is limited as the patient presents with no family and she is mentally retarded. Chart review shows patient has pulled out G-tube multiple times, including on 6/29. I did discuss with sister who was not present but states that patient has had her G-tube for about 2 or 3 years in total although she has pulled it out a couple times. No reports of illness or fevers.  HPI  Past Medical History:  Diagnosis Date  . Anemia   . Anginal pain ([place])   . Cataract   . Cataracts, bilateral   . CHF (congestive heart failure) ([place])   . Coronary artery disease   . Dementia   . Dental caries   . Dysphagia   . Esophageal reflux   . High cholesterol   . Hyperlipidemia   . Hypotension   . Hypothyroidism   . Optic atrophy   . Optic atrophy   . Osteoporosis   . 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 Paroxysmal atrial fibrillation (HCC)   . Seizures ([place])   . Sepsis ([place]) 01/2016  . Thyroid disease    hypothyroid  . Unspecified convulsions [facility])     Patient Active Problem List   Diagnosis Date Noted  . Seizures ([place]) 02/15/2016  . PNA (pneumonia) 02/08/2016  . Severe mental retardation 01/10/2015  . Severe dementia 01/10/2015  . Senile debility 01/10/2015  . 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 Acute respiratory failure with hypoxia ([place]) 11/20/2014  . Generalized weakness 11/20/2014  . Dysphagia 11/20/2014  . Dementia without behavioral disturbance 11/20/2014  . Development delay 11/02/2014  . Acute respiratory distress (HCC) 11/02/2014  . Paroxysmal ventricular tachycardia  ([place]) 10/30/2014  . HCAP (healthcare-associated pneumonia) 10/26/2014  . Hematoma of right parietal scalp 08/13/2014  . Edema 08/09/2014  . Hematoma 08/05/2014  . Fall at nursing home 08/05/2014  . Leukocytosis 07/26/2014  . Tachycardia 07/11/2014  . Leukocytopenia, unspecified 07/11/2014  . Pneumonia 06/17/2014  . Sepsis ([place]) 06/12/2014  . Facial droop 06/12/2014  . Hypernatremia 06/12/2014  . PEG (percutaneous endoscopic gastrostomy) status ([place]) 06/12/2014  . FUO (fever of unknown origin) 06/02/2014  . Paroxysmal a-fib ([place]) 05/14/2014  . Dysphasia 05/14/2014  . UTI (lower urinary tract infection) 05/14/2014  . Tooth decay 05/14/2014  . SIRS (systemic inflammatory response syndrome) ([place]) 12/15/2013  . Thrombocytopenia ([place]) 10/29/2013  . Hypotension 10/29/2013  . Anemia of chronic disease 10/29/2013  . Diastolic heart failure, NYHA class 2 ([place]) 10/29/2013  . Atrial fibrillation with rapid ventricular response ([place]) 10/28/2013  . Seizure disorder ([place]) 10/28/2013  . Hypothyroidism 10/28/2013  . HLD (hyperlipidemia) 10/28/2013  . Non-ST elevation myocardial infarction (NSTEMI) ([place]) 10/28/2013    Past Surgical History:  Procedure Laterality Date  . CENTRAL VENOUS CATHETER INSERTION  05/15/2014      . ESOPHAGOGASTRODUODENOSCOPY N/A 06/03/2014   Procedure: ESOPHAGOGASTRODUODENOSCOPY (EGD);  Surgeon: Beryle Beams, MD;  Location: [facility] ENDOSCOPY;  Service: Endoscopy;  Laterality: N/A;  . GASTROSTOMY TUBE PLACEMENT    . midline incision  OB History    Gravida Para Term Preterm AB Living   0             SAB TAB Ectopic Multiple Live Births                   Home Medications    Prior to Admission medications   Medication Sig Start Date End Date Taking? Authorizing Provider  Amino Acids-Protein Hydrolys (FEEDING SUPPLEMENT, PRO-STAT SUGAR FREE 64,) LIQD Place 30 mLs into feeding tube daily. 02/14/16   Ripudeep Krystal Eaton, MD  clonazePAM (KLONOPIN) 0.5 MG tablet Place 1  tablet ( 0.5 mg total ) into feeding tube every 6 hours    Historical Provider, MD  docusate (COLACE) 50 MG/5ML liquid Place 20 mLs (200 mg total) into feeding tube 2 (two) times daily. 06/09/14   Bonnielee Haff, MD  famotidine (PEPCID) 20 MG tablet Give 1 tablet via peg tube daily ( 20 mg )    Historical Provider, MD  furosemide (LASIX) 20 MG tablet 20 mg by PEG Tube route 2 (two) times daily.    Historical Provider, MD  levETIRAcetam (KEPPRA) 100 MG/ML solution Place 5 mLs (500 mg total) into feeding tube daily at 6 (six) AM. 06/09/14   Bonnielee Haff, MD  levETIRAcetam (KEPPRA) 100 MG/ML solution Place 750 mg into feeding tube daily. 2 p and 8 p    Historical Provider, MD  levothyroxine (SYNTHROID, LEVOTHROID) 100 MCG tablet 100 mcg by PEG Tube route daily before breakfast.    Historical Provider, MD  metoprolol tartrate (LOPRESSOR) 25 MG tablet 12.5 mg by PEG Tube route 3 (three) times daily.    Historical Provider, MD  Multiple Vitamins-Minerals (CENTAMIN) LIQD Place 5 mLs into feeding tube daily.    Historical Provider, MD  potassium chloride 20 MEQ/15ML (10%) SOLN 20 mEq by PEG Tube route daily. Dilute with water before administered.    Historical Provider, MD  Valproic Acid (DEPAKENE) 250 MG/5ML SYRP syrup Place 15 mLs (750 mg total) into feeding tube 3 (three) times daily. 06/09/14   Bonnielee Haff, MD    Family History Family History  Problem Relation Age of Onset  . Colon cancer Mother   . Diabetes type II Sister     Social History Social History  Substance Use Topics  . Smoking status: Never Smoker  . Smokeless tobacco: Never Used  . Alcohol use No     Allergies   Carvedilol and Ppd [tuberculin purified protein derivative]   Review of Systems Review of Systems  Unable to perform ROS: Patient nonverbal     Physical Exam Updated Vital Signs BP 112/86   Pulse 88   Temp 98.1 F (36.7 C) (Axillary)   Resp 20   SpO2 98%   Physical Exam  Constitutional: She appears  well-developed and well-nourished.  Contracted, does not follow commands  HENT:  Head: Normocephalic and atraumatic.  Right Ear: External ear normal.  Left Ear: External ear normal.  Nose: Nose normal.  Eyes: Right eye exhibits no discharge. Left eye exhibits no discharge.  Cardiovascular: Normal rate, regular rhythm and normal heart sounds.   Pulmonary/Chest: Effort normal and breath sounds normal.  Abdominal: Soft. She exhibits no distension. There is no tenderness.    Neurological: She is alert. GCS eye subscore is 4. GCS verbal subscore is 2. GCS motor subscore is 5.  Non verbal, does not follow commands  Skin: Skin is warm and dry.  Nursing note and vitals reviewed.    ED  Treatments / Results  Labs (all labs ordered are listed, but only abnormal results are displayed) Labs Reviewed - No data to display  EKG  EKG Interpretation None       Radiology Dg Abd Portable 1v  Result Date: 05/12/2016 CLINICAL DATA:  Dislodged gastrostomy tube EXAM: PORTABLE ABDOMEN - 1 VIEW COMPARISON:  04/19/2016 FINDINGS: Gastrostomy tube projects over the left upper quadrant. Contrast was injected through the gastrostomy tube which opacifies the stomach and proximal duodenum. The tip appears to be within the distal stomach. Prior cholecystectomy. Nonobstructive bowel gas pattern. IMPRESSION: Gastrostomy tube tip appears to be in the distal stomach. No evidence of contrast extravasation. Electronically Signed   By: Rolm Baptise M.D.   On: 05/12/2016 12:56   Procedures Procedures (including critical care time)  Medications Ordered in ED Medications  diatrizoate meglumine-sodium (GASTROGRAFIN) 66-10 % solution (20 mLs Gastrostomy Tube Given 05/12/16 1240)     Initial Impression / Assessment and Plan / ED Course  I have reviewed the triage vital signs and the nursing notes.  Pertinent labs & imaging results that were available during my care of the patient were reviewed by me and considered  in my medical decision making (see chart for details).  Clinical Course  Comment By Time  I attempted a foley catheter, 18 Fr without success. Barely able to place into skin. Tried cotton swab but this barely gets past the cotton tip. I think the tract is now too small; likely from this occurring over 12 hours ago. Will consult IR Sherwood Gambler, MD 07/29 [phone]  D/w Ronny Bacon, working with Dr. Anselm Pancoast. He will put patient on list to get G-tube placed. Gave contact information for sister who is POA Sherwood Gambler, MD 07/29 [phone]  D/w Nurse at [facility] rehab. She states she was told patient pulled out g-tube last night. Director of nursing tried to put back in but hole had closed up. Despite EMS report, nurse says patient is at her baseline, no new symptoms, fevers, vomiting, etc Sherwood Gambler, MD 07/29 1001  IR Placed a tube through a wire through G-tube opening. Will order KUB to confirm placement Sherwood Gambler, MD 07/29 1158  KUB shows appropriate placement. D/c back to facility Sherwood Gambler, MD 07/29 1302    Final Clinical Impressions(s) / ED Diagnoses   Final diagnoses:  Dislodged gastrostomy tube [facility])    New Prescriptions Discharge Medication List as of 05/12/2016  1:01 PM       Sherwood Gambler, MD 05/12/16 1631

## 2016-05-12 NOTE — ED Notes (Signed)
[facility] contacted to transport patient back to [facility]

## 2016-05-12 NOTE — ED Triage Notes (Signed)
Pt here due to pulling out her g tube last night about 9 pm. Per EMS, pt more agitated than normal.

## 2016-05-12 NOTE — Progress Notes (Signed)
New balloon retention tube placed 22f  No complications.  KUB to confirm placement to be done spoke with dr Regenia Skeeter.

## 2016-05-12 NOTE — ED Notes (Signed)
Pt is in stable condition upon d/c and is escorted back to [facility] via [place].

## 2016-05-18 ENCOUNTER — Non-Acute Institutional Stay (SKILLED_NURSING_FACILITY): Payer: Medicare Other | Admitting: Internal Medicine

## 2016-05-18 ENCOUNTER — Encounter: Payer: Self-pay | Admitting: Internal Medicine

## 2016-05-18 DIAGNOSIS — F0390 Unspecified dementia without behavioral disturbance: Secondary | ICD-10-CM

## 2016-05-18 DIAGNOSIS — D638 Anemia in other chronic diseases classified elsewhere: Secondary | ICD-10-CM | POA: Diagnosis not present

## 2016-05-18 DIAGNOSIS — Z931 Gastrostomy status: Secondary | ICD-10-CM

## 2016-05-18 NOTE — Progress Notes (Signed)
MRN: [id number] Name: Dawn [name]  Sex: female Age: 70 y.o. DOB: 1946-05-04  [place] #:  Facility/Room: [name]: SN[facility] Provider: Noah Delaine. Sheppard Coil, MD Emergency Contacts: Extended Emergency Contact Information Primary Emergency Contact: Sellars,[name] Address: [address]          [place] [address] [place] Phone: [phone] Relation: Sister Secondary Emergency Contact: Breindel Cower [place] Phone: [phone] Relation: None  Code Status: DNR  Allergies: Carvedilol and Ppd [tuberculin purified protein derivative]  Chief Complaint  Patient presents with  . Medical Management of Chronic Issues    Routine Visit    HPI: Patient is 70 y.o. female who is being seen for routine issues of dementia, anemia and PEG status.  Past Medical History:  Diagnosis Date  . Anemia   . Anginal pain ([place])   . Cataract   . Cataracts, bilateral   . CHF (congestive heart failure) ([place])   . Coronary artery disease   . Dementia   . Dental caries   . Dysphagia   . Esophageal reflux   . High cholesterol   . Hyperlipidemia   . Hypotension   . Hypothyroidism   . Optic atrophy   . Optic atrophy   . Osteoporosis   . 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 Paroxysmal atrial fibrillation (HCC)   . Seizures ([place])   . Sepsis ([place]) 01/2016  . Thyroid disease    hypothyroid  . Unspecified convulsions ([place])     Past Surgical History:  Procedure Laterality Date  . CENTRAL VENOUS CATHETER INSERTION  05/15/2014      . ESOPHAGOGASTRODUODENOSCOPY N/A 06/03/2014   Procedure: ESOPHAGOGASTRODUODENOSCOPY (EGD);  Surgeon: Beryle Beams, MD;  Location: [facility] ENDOSCOPY;  Service: Endoscopy;  Laterality: N/A;  . GASTROSTOMY TUBE PLACEMENT    . midline incision        Medication List       Accurate as of 05/18/16 10:24 AM. Always use your most recent med list.          CENTAMIN Liqd Place 5 mLs into feeding tube daily.   clonazePAM  1 MG tablet Commonly known as:  KLONOPIN Place 1 mg into feeding tube every 8 (eight) hours.   docusate 50 MG/5ML liquid Commonly known as:  COLACE Place 20 mLs (200 mg total) into feeding tube 2 (two) times daily.   famotidine 20 MG tablet Commonly known as:  PEPCID Give 1 tablet via peg tube daily ( 20 mg )   feeding supplement (PRO-STAT SUGAR FREE 64) Liqd Place 30 mLs into feeding tube daily.   furosemide 20 MG tablet Commonly known as:  LASIX 20 mg by PEG Tube route 2 (two) times daily.   levETIRAcetam 100 MG/ML solution Commonly known as:  KEPPRA Place 750 mg into feeding tube daily. 2 p and 8 p   levETIRAcetam 100 MG/ML solution Commonly known as:  KEPPRA Place 5 mLs (500 mg total) into feeding tube daily at 6 (six) AM.   levothyroxine 100 MCG tablet Commonly known as:  SYNTHROID, LEVOTHROID 100 mcg by PEG Tube route daily before breakfast.   metoprolol tartrate 25 MG tablet Commonly known as:  LOPRESSOR 12.5 mg by PEG Tube route 3 (three) times daily.   NUTRITIONAL SUPPLEMENT PO Two cal hn via peg at 39 cc/hr x 24 hours continuously   potassium chloride 20 MEQ/15ML (10%) Soln 20 mEq by PEG Tube route daily. Dilute with water  before administered.   Valproic Acid 250 MG/5ML Syrp syrup Commonly known as:  DEPAKENE Place 15 mLs (750 mg total) into feeding tube 3 (three) times daily.       Meds ordered this encounter  Medications  . clonazePAM (KLONOPIN) 1 MG tablet    Sig: Place 1 mg into feeding tube every 8 (eight) hours.  . Nutritional Supplements (NUTRITIONAL SUPPLEMENT PO)    Sig: Two cal hn via peg at 39 cc/hr x 24 hours continuously    Immunization History  Administered Date(s) Administered  . Influenza-Unspecified 08/13/2014, 07/11/2015    Social History  Substance Use Topics  . Smoking status: Never Smoker  . Smokeless tobacco: Never Used  . Alcohol use No    Review of Systems  DATA OBTAINED: from patient, nurse, medical record,  family member GENERAL:  no fevers, fatigue, appetite changes SKIN: No itching, rash HEENT: No complaint RESPIRATORY: No cough, wheezing, SOB CARDIAC: No chest pain, palpitations, lower extremity edema  GI: No abdominal pain, No N/V/D or constipation, No heartburn or reflux  GU: No dysuria, frequency or urgency, or incontinence  MUSCULOSKELETAL: No unrelieved bone/joint pain NEUROLOGIC: No headache, dizziness  PSYCHIATRIC: No overt anxiety or sadness  Vitals:   05/18/16 0929  BP: (!) 90/57  Pulse: 78  Resp: 18  Temp: 97 F (36.1 C)    Physical Exam  GENERAL APPEARANCE: eyes open , conversant, No acute distress  SKIN: No diaphoresis rash HEENT: Unremarkable RESPIRATORY: Breathing is even, unlabored. Lung sounds are clear   CARDIOVASCULAR: Heart RRR no murmurs, rubs or gallops. No peripheral edema  GASTROINTESTINAL: Abdomen is soft, non-tender, not distended w/ normal bowel sounds.  GENITOURINARY: Bladder non tender, not distended  MUSCULOSKELETAL: BUE and BLE contractures NEUROLOGIC: vegetative;inv movements of UE some PSYCHIATRIC: n/a  Patient Active Problem List   Diagnosis Date Noted  . Seizures ([place]) 02/15/2016  . PNA (pneumonia) 02/08/2016  . Severe mental retardation 01/10/2015  . Severe dementia 01/10/2015  . Senile debility 01/10/2015  . 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 Acute respiratory failure with hypoxia ([place]) 11/20/2014  . Generalized weakness 11/20/2014  . Dysphagia 11/20/2014  . Dementia without behavioral disturbance 11/20/2014  . Development delay 11/02/2014  . Acute respiratory distress (HCC) 11/02/2014  . Paroxysmal ventricular tachycardia ([place]) 10/30/2014  . HCAP (healthcare-associated pneumonia) 10/26/2014  . Hematoma of right parietal scalp 08/13/2014  . Edema 08/09/2014  . Hematoma 08/05/2014  . Fall at nursing home 08/05/2014  . Leukocytosis 07/26/2014  . Tachycardia 07/11/2014  . Leukocytopenia, unspecified 07/11/2014  .  Pneumonia 06/17/2014  . Sepsis ([place]) 06/12/2014  . Facial droop 06/12/2014  . Hypernatremia 06/12/2014  . PEG (percutaneous endoscopic gastrostomy) status ([place]) 06/12/2014  . FUO (fever of unknown origin) 06/02/2014  . Paroxysmal a-fib ([place]) 05/14/2014  . Dysphasia 05/14/2014  . UTI (lower urinary tract infection) 05/14/2014  . Tooth decay 05/14/2014  . SIRS (systemic inflammatory response syndrome) ([place]) 12/15/2013  . Thrombocytopenia ([place]) 10/29/2013  . Hypotension 10/29/2013  . Anemia of chronic disease 10/29/2013  . Diastolic heart failure, NYHA class 2 ([place]) 10/29/2013  . Atrial fibrillation with rapid ventricular response ([place]) 10/28/2013  . Seizure disorder ([place]) 10/28/2013  . Hypothyroidism 10/28/2013  . HLD (hyperlipidemia) 10/28/2013  . Non-ST elevation myocardial infarction (NSTEMI) ([place]) 10/28/2013    CBC    Component Value Date/Time   WBC 9.2 04/24/2016   WBC 12.9 (H) 02/13/2016 0405   RBC 3.24 (L) 02/13/2016 0405   HGB 12.3 04/24/2016   HCT 37  04/24/2016   HCT 25.4 (L) 11/03/2014 0725   PLT 390 04/24/2016   MCV 98.8 02/13/2016 0405   LYMPHSABS 2.8 02/08/2016 1021   MONOABS 3.0 (H) 02/08/2016 1021   EOSABS 0.0 02/08/2016 1021   BASOSABS 0.0 02/08/2016 1021    CMP     Component Value Date/Time   NA 136 (A) 04/24/2016   K 4.7 04/24/2016   CL 108 02/13/2016 0405   CO2 31 02/13/2016 0405   GLUCOSE 149 (H) 02/13/2016 0405   BUN 24 (A) 04/24/2016   CREATININE 0.5 04/24/2016   CREATININE 0.66 02/13/2016 0405   CALCIUM 8.5 (L) 02/13/2016 0405   PROT 7.4 02/08/2016 1021   ALBUMIN 2.5 (L) 02/08/2016 1021   AST 17 04/16/2016   ALT 13 04/16/2016   ALKPHOS 124 04/16/2016   BILITOT 0.6 02/08/2016 1021   GFRNONAA >60 02/13/2016 0405   GFRAA >60 02/13/2016 0405    Assessment and Plan  Dementia without behavioral disturbance 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 Sheppard Coil, MD

## 2016-05-19 ENCOUNTER — Encounter: Payer: Self-pay | Admitting: Internal Medicine

## 2016-06-03 ENCOUNTER — Encounter (HOSPITAL_COMMUNITY): Payer: Self-pay | Admitting: Nurse Practitioner

## 2016-06-03 ENCOUNTER — Inpatient Hospital Stay (HOSPITAL_COMMUNITY)
Admission: EM | Admit: 2016-06-03 | Discharge: 2016-06-07 | DRG: 871 | Disposition: A | Discharge status: Skilled Nursing Facility | Payer: Medicare Other | Attending: Internal Medicine | Admitting: Internal Medicine

## 2016-06-03 ENCOUNTER — Emergency Department (HOSPITAL_COMMUNITY): Payer: Medicare Other

## 2016-06-03 DIAGNOSIS — E039 Hypothyroidism, unspecified: Secondary | ICD-10-CM | POA: Diagnosis present

## 2016-06-03 DIAGNOSIS — E7800 Pure hypercholesterolemia, unspecified: Secondary | ICD-10-CM | POA: Diagnosis present

## 2016-06-03 DIAGNOSIS — R05 Cough: Secondary | ICD-10-CM

## 2016-06-03 DIAGNOSIS — F0390 Unspecified dementia without behavioral disturbance: Secondary | ICD-10-CM | POA: Diagnosis present

## 2016-06-03 DIAGNOSIS — R1310 Dysphagia, unspecified: Secondary | ICD-10-CM | POA: Diagnosis present

## 2016-06-03 DIAGNOSIS — R059 Cough, unspecified: Secondary | ICD-10-CM

## 2016-06-03 DIAGNOSIS — E872 Acidosis, unspecified: Secondary | ICD-10-CM

## 2016-06-03 DIAGNOSIS — K219 Gastro-esophageal reflux disease without esophagitis: Secondary | ICD-10-CM | POA: Diagnosis present

## 2016-06-03 DIAGNOSIS — D72829 Elevated white blood cell count, unspecified: Secondary | ICD-10-CM

## 2016-06-03 DIAGNOSIS — G8220 Paraplegia, unspecified: Secondary | ICD-10-CM | POA: Diagnosis present

## 2016-06-03 DIAGNOSIS — Z931 Gastrostomy status: Secondary | ICD-10-CM

## 2016-06-03 DIAGNOSIS — G40909 Epilepsy, unspecified, not intractable, without status epilepticus: Secondary | ICD-10-CM | POA: Diagnosis present

## 2016-06-03 DIAGNOSIS — F72 Severe intellectual disabilities: Secondary | ICD-10-CM | POA: Diagnosis present

## 2016-06-03 DIAGNOSIS — H269 Unspecified cataract: Secondary | ICD-10-CM | POA: Diagnosis present

## 2016-06-03 DIAGNOSIS — J189 Pneumonia, unspecified organism: Secondary | ICD-10-CM | POA: Diagnosis not present

## 2016-06-03 DIAGNOSIS — A419 Sepsis, unspecified organism: Secondary | ICD-10-CM | POA: Diagnosis present

## 2016-06-03 DIAGNOSIS — E038 Other specified hypothyroidism: Secondary | ICD-10-CM | POA: Diagnosis not present

## 2016-06-03 DIAGNOSIS — E876 Hypokalemia: Secondary | ICD-10-CM | POA: Diagnosis present

## 2016-06-03 DIAGNOSIS — I509 Heart failure, unspecified: Secondary | ICD-10-CM | POA: Diagnosis present

## 2016-06-03 DIAGNOSIS — Z79899 Other long term (current) drug therapy: Secondary | ICD-10-CM | POA: Diagnosis not present

## 2016-06-03 DIAGNOSIS — I480 Paroxysmal atrial fibrillation: Secondary | ICD-10-CM | POA: Diagnosis present

## 2016-06-03 DIAGNOSIS — G809 Cerebral palsy, unspecified: Secondary | ICD-10-CM | POA: Diagnosis present

## 2016-06-03 DIAGNOSIS — Z66 Do not resuscitate: Secondary | ICD-10-CM | POA: Diagnosis present

## 2016-06-03 DIAGNOSIS — R6521 Severe sepsis with septic shock: Secondary | ICD-10-CM

## 2016-06-03 DIAGNOSIS — R112 Nausea with vomiting, unspecified: Secondary | ICD-10-CM

## 2016-06-03 DIAGNOSIS — J690 Pneumonitis due to inhalation of food and vomit: Secondary | ICD-10-CM

## 2016-06-03 DIAGNOSIS — I2510 Atherosclerotic heart disease of native coronary artery without angina pectoris: Secondary | ICD-10-CM | POA: Diagnosis present

## 2016-06-03 LAB — CBC WITH DIFFERENTIAL/PLATELET
BASOS ABS: 0 10*3/uL (ref 0.0–0.1)
Basophils Relative: 0 %
EOS PCT: 0 %
Eosinophils Absolute: 0 10*3/uL (ref 0.0–0.7)
HEMATOCRIT: 45.5 % (ref 36.0–46.0)
HEMOGLOBIN: 14.8 g/dL (ref 12.0–15.0)
Lymphocytes Relative: 4 %
Lymphs Abs: 1.4 10*3/uL (ref 0.7–4.0)
MCH: 30.6 pg (ref 26.0–34.0)
MCHC: 32.5 g/dL (ref 30.0–36.0)
MCV: 94 fL (ref 78.0–100.0)
MONOS PCT: 5 %
Monocytes Absolute: 1.8 10*3/uL — ABNORMAL HIGH (ref 0.1–1.0)
NEUTROS PCT: 91 %
Neutro Abs: 31.8 10*3/uL — ABNORMAL HIGH (ref 1.7–7.7)
Platelets: 297 10*3/uL (ref 150–400)
RBC: 4.84 MIL/uL (ref 3.87–5.11)
RDW: 14.2 % (ref 11.5–15.5)
WBC: 35 10*3/uL — AB (ref 4.0–10.5)

## 2016-06-03 LAB — BASIC METABOLIC PANEL
ANION GAP: 14 (ref 5–15)
BUN: 22 mg/dL — AB (ref 4–21)
BUN: 22 mg/dL — ABNORMAL HIGH (ref 6–20)
CALCIUM: 9.2 mg/dL (ref 8.9–10.3)
CHLORIDE: 100 mmol/L — AB (ref 101–111)
CO2: 23 mmol/L (ref 22–32)
CREATININE: 0.7 mg/dL (ref 0.5–1.1)
CREATININE: 0.67 mg/dL (ref 0.44–1.00)
GFR calc non Af Amer: >60 mL/min (ref >60)
GLUCOSE: 120 mg/dL
Glucose, Bld: 120 mg/dL — ABNORMAL HIGH (ref 65–99)
POTASSIUM: 4.6 mmol/L (ref 3.4–5.3)
Potassium: 4.6 mmol/L (ref 3.5–5.1)
SODIUM: 137 mmol/L (ref 135–145)
Sodium: 137 mmol/L (ref 137–147)

## 2016-06-03 LAB — URINALYSIS, ROUTINE W REFLEX MICROSCOPIC
Bilirubin Urine: NEGATIVE
GLUCOSE, UA: NEGATIVE mg/dL
HGB URINE DIPSTICK: NEGATIVE
KETONES UR: NEGATIVE mg/dL
LEUKOCYTES UA: NEGATIVE
Nitrite: NEGATIVE
PH: 6 (ref 5.0–8.0)
PROTEIN: NEGATIVE mg/dL
Specific Gravity, Urine: 1.02 (ref 1.005–1.030)

## 2016-06-03 LAB — CBC AND DIFFERENTIAL
HCT: 46 % (ref 36–46)
Hemoglobin: 14.8 g/dL (ref 12.0–16.0)
Platelets: 297 10*3/uL (ref 150–399)
WBC: 35 10*3/mL

## 2016-06-03 LAB — I-STAT CG4 LACTIC ACID, ED: LACTIC ACID, VENOUS: 8.63 mmol/L — AB (ref 0.5–1.9)

## 2016-06-03 MED ORDER — PIPERACILLIN-TAZOBACTAM 3.375 G IVPB 30 MIN
3.3750 g | Freq: Once | INTRAVENOUS | Status: AC
  Administered 2016-06-03: 3.375 g via INTRAVENOUS
  Filled 2016-06-03: qty 50

## 2016-06-03 MED ORDER — SODIUM CHLORIDE 0.9 % IV BOLUS (SEPSIS)
1000.0000 mL | Freq: Once | INTRAVENOUS | Status: AC
  Administered 2016-06-03: 1000 mL via INTRAVENOUS

## 2016-06-03 MED ORDER — ACETAMINOPHEN 160 MG/5ML PO SOLN
650.0000 mg | Freq: Once | ORAL | Status: AC
  Administered 2016-06-03: 650 mg via ORAL
  Filled 2016-06-03: qty 20.3

## 2016-06-03 MED ORDER — VANCOMYCIN HCL IN DEXTROSE 1-5 GM/200ML-% IV SOLN
1000.0000 mg | Freq: Once | INTRAVENOUS | Status: AC
  Administered 2016-06-03: 1000 mg via INTRAVENOUS
  Filled 2016-06-03: qty 200

## 2016-06-03 NOTE — ED Notes (Signed)
RN and Dr. Leonette Monarch notified of patient's Lactic Acid of 8.63

## 2016-06-03 NOTE — ED Triage Notes (Signed)
Pt is presented from [facility], reportedly had a violent episode or vomiting that they suspect pt may have aspirated. Pt does not have obvious signs of respiratory distress, faint rales ausculted diffusely in her lung sounds, O2 SATs seem to persistently >95%.

## 2016-06-03 NOTE — ED Notes (Signed)
Bed: [id number] Expected date:  Expected time:  Means of arrival:  Comments: EMS 69yo F vomiting / from [place]

## 2016-06-03 NOTE — ED Provider Notes (Signed)
[name] DEPT Provider Note   CSN: [id number] Arrival date & time: 06/03/16  2017     History   Chief Complaint Chief Complaint  Patient presents with  . Emesis  . R/O Aspiration    HPI Dawn [name] is a 70 y.o. female.  HPI  Remainder of history, ROS, and physical exam limited due to patient's condition (cerebral palsy). Additional information was obtained from either EMS and facility records.   Level V Caveat.  Reported to have had a large volume episode of emesis and they were concerned that she had aspirated.  On reviewing the records patient has a history of cerebral palsy, seizures, mental retardation, aspiration requiring G-tube, previous pneumonia. Patient was admitted in April for sepsis secondary to pneumonia.  Past Medical History:  Diagnosis Date  . Anemia   . Anginal pain ([place])   . Cataract   . Cataracts, bilateral   . CHF (congestive heart failure) ([place])   . Coronary artery disease   . Dementia   . Dental caries   . Dysphagia   . Esophageal reflux   . High cholesterol   . Hyperlipidemia   . Hypotension   . Hypothyroidism   . Optic atrophy   . Optic atrophy   . Osteoporosis   . 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 Paroxysmal atrial fibrillation (HCC)   . Seizures ([place])   . Sepsis ([place]) 01/2016  . Thyroid disease    hypothyroid  . Unspecified convulsions [facility])     Patient Active Problem List   Diagnosis Date Noted  . Seizures ([place]) 02/15/2016  . PNA (pneumonia) 02/08/2016  . Severe mental retardation 01/10/2015  . Severe dementia 01/10/2015  . Senile debility 01/10/2015  . 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 Acute respiratory failure with hypoxia ([place]) 11/20/2014  . Generalized weakness 11/20/2014  . Dysphagia 11/20/2014  . Dementia without behavioral disturbance 11/20/2014  . Development delay 11/02/2014  . Acute respiratory distress (HCC) 11/02/2014  . Paroxysmal ventricular tachycardia ([place]) 10/30/2014  . HCAP  (healthcare-associated pneumonia) 10/26/2014  . Hematoma of right parietal scalp 08/13/2014  . Edema 08/09/2014  . Hematoma 08/05/2014  . Fall at nursing home 08/05/2014  . Leukocytosis 07/26/2014  . Tachycardia 07/11/2014  . Leukocytopenia, unspecified 07/11/2014  . Pneumonia 06/17/2014  . Sepsis ([place]) 06/12/2014  . Facial droop 06/12/2014  . Hypernatremia 06/12/2014  . PEG (percutaneous endoscopic gastrostomy) status ([place]) 06/12/2014  . FUO (fever of unknown origin) 06/02/2014  . Paroxysmal a-fib ([place]) 05/14/2014  . Dysphasia 05/14/2014  . UTI (lower urinary tract infection) 05/14/2014  . Tooth decay 05/14/2014  . SIRS (systemic inflammatory response syndrome) ([place]) 12/15/2013  . Thrombocytopenia ([place]) 10/29/2013  . Hypotension 10/29/2013  . Anemia of chronic disease 10/29/2013  . Diastolic heart failure, NYHA class 2 ([place]) 10/29/2013  . Atrial fibrillation with rapid ventricular response ([place]) 10/28/2013  . Seizure disorder ([place]) 10/28/2013  . Hypothyroidism 10/28/2013  . HLD (hyperlipidemia) 10/28/2013  . Non-ST elevation myocardial infarction (NSTEMI) ([place]) 10/28/2013    Past Surgical History:  Procedure Laterality Date  . CENTRAL VENOUS CATHETER INSERTION  05/15/2014      . ESOPHAGOGASTRODUODENOSCOPY N/A 06/03/2014   Procedure: ESOPHAGOGASTRODUODENOSCOPY (EGD);  Surgeon: Beryle Beams, MD;  Location: [facility] ENDOSCOPY;  Service: Endoscopy;  Laterality: N/A;  . GASTROSTOMY TUBE PLACEMENT    . midline incision      OB History    Gravida Para Term Preterm AB Living   0  SAB TAB Ectopic Multiple Live Births                   Home Medications    Prior to Admission medications   Medication Sig Start Date End Date Taking? Authorizing Provider  Amino Acids-Protein Hydrolys (FEEDING SUPPLEMENT, PRO-STAT SUGAR FREE 64,) LIQD Place 30 mLs into feeding tube daily. 02/14/16  Yes Ripudeep Krystal Eaton, MD  clonazePAM (KLONOPIN) 1 MG tablet Place 1 mg into feeding tube every 8  (eight) hours.   Yes Historical Provider, MD  docusate (COLACE) 50 MG/5ML liquid Place 20 mLs (200 mg total) into feeding tube 2 (two) times daily. 06/09/14  Yes Bonnielee Haff, MD  famotidine (PEPCID) 20 MG tablet Give 1 tablet via peg tube daily ( 20 mg )    Yes Historical Provider, MD  furosemide (LASIX) 20 MG tablet 20 mg by PEG Tube route 2 (two) times daily.   Yes Historical Provider, MD  levETIRAcetam (KEPPRA) 100 MG/ML solution Place 5 mLs (500 mg total) into feeding tube daily at 6 (six) AM. 06/09/14  Yes Bonnielee Haff, MD  levothyroxine (SYNTHROID, LEVOTHROID) 100 MCG tablet 100 mcg by PEG Tube route daily before breakfast.   Yes Historical Provider, MD  metoprolol tartrate (LOPRESSOR) 25 MG tablet 12.5 mg by PEG Tube route 3 (three) times daily.   Yes Historical Provider, MD  Multiple Vitamins-Minerals (CENTAMIN) LIQD Place 5 mLs into feeding tube daily.   Yes Historical Provider, MD  Nutritional Supplements (NUTRITIONAL SUPPLEMENT PO) Two cal hn via peg at 39 cc/hr x 24 hours continuously   Yes Historical Provider, MD  potassium chloride 20 MEQ/15ML (10%) SOLN 20 mEq by PEG Tube route daily. Dilute with water before administered.   Yes Historical Provider, MD  Valproic Acid (DEPAKENE) 250 MG/5ML SYRP syrup Place 15 mLs (750 mg total) into feeding tube 3 (three) times daily. 06/09/14  Yes Bonnielee Haff, MD    Family History Family History  Problem Relation Age of Onset  . Colon cancer Mother   . Diabetes type II Sister     Social History Social History  Substance Use Topics  . Smoking status: Never Smoker  . Smokeless tobacco: Never Used  . Alcohol use No     Allergies   Carvedilol and Ppd [tuberculin purified protein derivative]   Review of Systems Review of Systems  Unable to perform ROS: Dementia     Physical Exam Updated Vital Signs BP 112/68   Pulse (!) 130   Temp 100.4 F (38 C) (Axillary)   Resp 18   SpO2 95%   Physical Exam  Constitutional: She  appears well-developed and well-nourished. No distress.  HENT:  Head: Normocephalic and atraumatic.  Right Ear: External ear normal.  Left Ear: External ear normal.  Nose: Nose normal.  Eyes: Conjunctivae and EOM are normal. Pupils are equal, round, and reactive to light. Right eye exhibits no discharge. Left eye exhibits no discharge. No scleral icterus.  Neck: Normal range of motion. Neck supple.  Cardiovascular: Normal rate, regular rhythm and normal heart sounds.  Exam reveals no gallop and no friction rub.   No murmur heard. Pulmonary/Chest: Effort normal. No stridor. No respiratory distress. She has no wheezes. She has rales (course rales throughout).  Abdominal: Soft. She exhibits no distension. There is no tenderness.    Musculoskeletal: She exhibits no edema or tenderness.  Neurological: She is alert. She is disoriented.  Nonverbal.  Skin: Skin is warm. No rash noted. She is diaphoretic. No  erythema.  Psychiatric: She has a normal mood and affect.     ED Treatments / Results  Labs (all labs ordered are listed, but only abnormal results are displayed) Labs Reviewed  CBC WITH DIFFERENTIAL/PLATELET - Abnormal; Notable for the following:       Result Value   WBC 35.0 (*)    Neutro Abs 31.8 (*)    Monocytes Absolute 1.8 (*)    All other components within normal limits  BASIC METABOLIC PANEL - Abnormal; Notable for the following:    Chloride 100 (*)    Glucose, Bld 120 (*)    BUN 22 (*)    All other components within normal limits  URINALYSIS, ROUTINE W REFLEX MICROSCOPIC (NOT AT [facility]) - Abnormal; Notable for the following:    APPearance CLOUDY (*)    All other components within normal limits  I-STAT CG4 LACTIC ACID, ED - Abnormal; Notable for the following:    Lactic Acid, Venous 8.63 (*)    All other components within normal limits  CULTURE, BLOOD (ROUTINE X 2)  CULTURE, BLOOD (ROUTINE X 2)  URINE CULTURE  MRSA PCR SCREENING  VALPROIC ACID LEVEL  LACTIC ACID,  PLASMA  LACTIC ACID, PLASMA  PROCALCITONIN  CBC WITH DIFFERENTIAL/PLATELET  COMPREHENSIVE METABOLIC PANEL    EKG  EKG Interpretation  Date/Time:  Friday May 25 2016 16:17:36 EDT Ventricular Rate:  129 PR Interval:  126 QRS Duration: 66 QT Interval:  312 QTC Calculation: 457 R Axis:   22 Text Interpretation:  Sinus tachycardia Nonspecific ST abnormality Abnormal ECG Confirmed by [facility] MD, Aritza Brunet ([id number]) on 06/04/2016 2:03:32 AM       Radiology Dg Chest 1 View  Result Date: 06/03/2016 CLINICAL DATA:  Vomiting.  Concern for aspiration. EXAM: CHEST 1 VIEW COMPARISON:  Chest radiograph 02/08/2016 FINDINGS: Examination is limited by patient positioning. Patient is markedly rotated to the right. There is shallow lung inflation. Cardiomediastinal silhouette is largely obscured by what is favored to represent extra thoracic soft tissue. This supine radiograph shows no large pleural effusion or pneumothorax. Hazy opacities at the left lung base are similar to the prior examination. No lobar consolidation. IMPRESSION: 1. Examination limited by patient positioning. 2. Persistent shallow lung inflation and hazy opacities at the left lung base. No other focal airspace disease. Electronically Signed   By: Ulyses Jarred M.D.   On: 06/03/2016 22:43    Procedures Procedures (including critical care time)  Medications Ordered in ED Medications  levETIRAcetam (KEPPRA) 500 mg in sodium chloride 0.9 % 100 mL IVPB (not administered)  valproate (DEPACON) 750 mg in dextrose 5 % 50 mL IVPB (750 mg Intravenous Given 06/04/16 0107)  metoprolol (LOPRESSOR) injection 2.5 mg (2.5 mg Intravenous Given 06/04/16 0152)  acetaminophen (TYLENOL) tablet 650 mg (not administered)    Or  acetaminophen (TYLENOL) suppository 650 mg (not administered)  ondansetron (ZOFRAN) tablet 4 mg (not administered)    Or  ondansetron (ZOFRAN) injection 4 mg (not administered)  enoxaparin (LOVENOX) injection 40 mg (not  administered)  0.9 %  sodium chloride infusion ( Intravenous New Bag/Given 06/04/16 0108)  ceFEPIme (MAXIPIME) 1 g in dextrose 5 % 50 mL IVPB (1 g Intravenous Given 06/04/16 0133)  chlorhexidine (PERIDEX) 0.12 % solution 15 mL (15 mLs Mouth Rinse Given 06/04/16 0152)  antiseptic oral rinse (CPC / CETYLPYRIDINIUM CHLORIDE 0.05%) solution 7 mL (not administered)  sodium chloride 0.9 % bolus 1,000 mL (0 mLs Intravenous Stopped 06/03/16 2335)  sodium chloride 0.9 % bolus 1,000 mL (0  mLs Intravenous Stopped 06/04/16 0133)    And  sodium chloride 0.9 % bolus 1,000 mL (0 mLs Intravenous Stopped 06/04/16 0133)  piperacillin-tazobactam (ZOSYN) IVPB 3.375 g (0 g Intravenous Stopped 06/03/16 2300)  vancomycin (VANCOCIN) IVPB 1000 mg/200 mL premix (0 mg Intravenous Stopped 06/03/16 2359)  acetaminophen (TYLENOL) solution 650 mg (650 mg Oral Given 06/03/16 2340)     Initial Impression / Assessment and Plan / ED Course  I have reviewed the triage vital signs and the nursing notes.  Pertinent labs & imaging results that were available during my care of the patient were reviewed by me and considered in my medical decision making (see chart for details).  Clinical Course    Repeated rectal temperature noted to be 103. Septic workup obtained which revealed lactic acidosis of 8.6. Code sepsis initiated at that time. Patient given 30 mL per Kg of IV fluids and started on empiric antibiotics. Workup with leukocytosis, and likely source of pneumonia. Urine without evidence of infection. Renal function intact.   On reassessment, patient's blood pressure remained normotensive.  Hospitalist was consulted for admission. Patient will be admitted to the stepdown unit for continued management.   CRITICAL CARE Performed by: Grayce Sessions Logyn Kendrick Total critical care time: 35 minutes Critical care time was exclusive of separately billable procedures and treating other patients. Critical care was necessary to treat or  prevent imminent or life-threatening deterioration. Critical care was time spent personally by me on the following activities: development of treatment plan with patient and/or surrogate as well as nursing, discussions with consultants, evaluation of patient's response to treatment, examination of patient, obtaining history from patient or surrogate, ordering and performing treatments and interventions, ordering and review of laboratory studies, ordering and review of radiographic studies, pulse oximetry and re-evaluation of patient's condition.   Final Clinical Impressions(s) / ED Diagnoses   Final diagnoses:  Cough  Septic shock ([place])  Aspiration pneumonia of left lower lobe, unspecified aspiration pneumonia type ([place])  Leukocytosis  Lactic acidosis    Disposition: Admit  Condition: serious    Fatima Blank, MD 06/04/16 [phone]

## 2016-06-04 ENCOUNTER — Inpatient Hospital Stay (HOSPITAL_COMMUNITY): Payer: Medicare Other

## 2016-06-04 ENCOUNTER — Encounter (HOSPITAL_COMMUNITY): Payer: Self-pay | Admitting: Internal Medicine

## 2016-06-04 DIAGNOSIS — A419 Sepsis, unspecified organism: Principal | ICD-10-CM

## 2016-06-04 LAB — CBC WITH DIFFERENTIAL/PLATELET
BASOS PCT: 0 %
Basophils Absolute: 0 10*3/uL (ref 0.0–0.1)
EOS ABS: 0 10*3/uL (ref 0.0–0.7)
Eosinophils Relative: 0 %
HCT: 39.5 % (ref 36.0–46.0)
HEMOGLOBIN: 12.9 g/dL (ref 12.0–15.0)
LYMPHS PCT: 6 %
Lymphs Abs: 2.5 10*3/uL (ref 0.7–4.0)
MCH: 30.6 pg (ref 26.0–34.0)
MCHC: 32.7 g/dL (ref 30.0–36.0)
MCV: 93.6 fL (ref 78.0–100.0)
MONO ABS: 2.5 10*3/uL — AB (ref 0.1–1.0)
Monocytes Relative: 6 %
NEUTROS PCT: 88 %
Neutro Abs: 37.4 10*3/uL — ABNORMAL HIGH (ref 1.7–7.7)
PLATELETS: 275 10*3/uL (ref 150–400)
RBC: 4.22 MIL/uL (ref 3.87–5.11)
RDW: 14.4 % (ref 11.5–15.5)
WBC: 42.4 10*3/uL — AB (ref 4.0–10.5)

## 2016-06-04 LAB — COMPREHENSIVE METABOLIC PANEL
ALBUMIN: 2.5 g/dL — AB (ref 3.5–5.0)
ALK PHOS: 91 U/L (ref 38–126)
ALT: 18 U/L (ref 14–54)
AST: 30 U/L (ref 15–41)
Anion gap: 6 (ref 5–15)
BUN: 17 mg/dL (ref 6–20)
CALCIUM: 8.4 mg/dL — AB (ref 8.9–10.3)
CHLORIDE: 107 mmol/L (ref 101–111)
CO2: 23 mmol/L (ref 22–32)
CREATININE: 0.57 mg/dL (ref 0.44–1.00)
GFR calc non Af Amer: >60 mL/min (ref >60)
GLUCOSE: 117 mg/dL — AB (ref 65–99)
Potassium: 4.2 mmol/L (ref 3.5–5.1)
SODIUM: 136 mmol/L (ref 135–145)
Total Bilirubin: 0.5 mg/dL (ref 0.3–1.2)
Total Protein: 7.4 g/dL (ref 6.5–8.1)

## 2016-06-04 LAB — CBC AND DIFFERENTIAL
HCT: 40 % (ref 36–46)
Hemoglobin: 12.9 g/dL (ref 12.0–16.0)
Platelets: 275 10*3/uL (ref 150–399)
WBC: 42.4 10^3/mL

## 2016-06-04 LAB — LACTIC ACID, PLASMA
LACTIC ACID, VENOUS: 3.4 mmol/L — AB (ref 0.5–1.9)
LACTIC ACID, VENOUS: 3.2 mmol/L — AB (ref 0.5–1.9)

## 2016-06-04 LAB — GLUCOSE, CAPILLARY
GLUCOSE-CAPILLARY: 126 mg/dL — AB (ref 65–99)
GLUCOSE-CAPILLARY: 87 mg/dL (ref 65–99)
Glucose-Capillary: 117 mg/dL — ABNORMAL HIGH (ref 65–99)
Glucose-Capillary: 121 mg/dL — ABNORMAL HIGH (ref 65–99)
Glucose-Capillary: 85 mg/dL (ref 65–99)

## 2016-06-04 LAB — BASIC METABOLIC PANEL
BUN: 17 mg/dL (ref 4–21)
CREATININE: 0.6 mg/dL (ref 0.5–1.1)
GLUCOSE: 117 mg/dL
Potassium: 4.2 mmol/L (ref 3.4–5.3)
SODIUM: 136 mmol/L — AB (ref 137–147)

## 2016-06-04 LAB — VALPROIC ACID LEVEL: Valproic Acid Lvl: 30 ug/mL — ABNORMAL LOW (ref 50.0–100.0)

## 2016-06-04 LAB — PROCALCITONIN: Procalcitonin: 0.48 ng/mL

## 2016-06-04 LAB — MRSA PCR SCREENING: MRSA by PCR: NEGATIVE

## 2016-06-04 LAB — HEPATIC FUNCTION PANEL: BILIRUBIN, TOTAL: 0.5 mg/dL

## 2016-06-04 MED ORDER — CEFEPIME HCL 1 G IJ SOLR
1.0000 g | Freq: Three times a day (TID) | INTRAMUSCULAR | Status: DC
  Administered 2016-06-04 – 2016-06-06 (×8): 1 g via INTRAVENOUS
  Filled 2016-06-04 (×10): qty 1

## 2016-06-04 MED ORDER — SODIUM CHLORIDE 0.9 % IV SOLN
INTRAVENOUS | Status: AC
  Administered 2016-06-04: 01:00:00 via INTRAVENOUS

## 2016-06-04 MED ORDER — ONDANSETRON HCL 4 MG PO TABS: 4.0000 mg | ORAL_TABLET | Freq: Four times a day (QID) | ORAL | Status: DC | PRN

## 2016-06-04 MED ORDER — SODIUM CHLORIDE 0.9 % IV SOLN
500.0000 mg | INTRAVENOUS | Status: AC
  Administered 2016-06-04 – 2016-06-06 (×3): 500 mg via INTRAVENOUS
  Filled 2016-06-04 (×3): qty 5

## 2016-06-04 MED ORDER — ONDANSETRON HCL 4 MG/2ML IJ SOLN: 4.0000 mg | Freq: Four times a day (QID) | INTRAMUSCULAR | Status: DC | PRN

## 2016-06-04 MED ORDER — ACETAMINOPHEN 325 MG PO TABS
650.0000 mg | ORAL_TABLET | Freq: Four times a day (QID) | ORAL | Status: DC | PRN
  Administered 2016-06-06 (×2): 650 mg via ORAL
  Filled 2016-06-04 (×2): qty 2

## 2016-06-04 MED ORDER — ACETAMINOPHEN 650 MG RE SUPP: 650.0000 mg | Freq: Four times a day (QID) | RECTAL | Status: DC | PRN

## 2016-06-04 MED ORDER — VALPROATE SODIUM 500 MG/5ML IV SOLN
750.0000 mg | Freq: Three times a day (TID) | INTRAVENOUS | Status: DC
  Administered 2016-06-04 – 2016-06-06 (×8): 750 mg via INTRAVENOUS
  Filled 2016-06-04 (×9): qty 7.5

## 2016-06-04 MED ORDER — CETYLPYRIDINIUM CHLORIDE 0.05 % MT LIQD
7.0000 mL | Freq: Two times a day (BID) | OROMUCOSAL | Status: DC
  Administered 2016-06-05 – 2016-06-07 (×5): 7 mL via OROMUCOSAL

## 2016-06-04 MED ORDER — CHLORHEXIDINE GLUCONATE 0.12 % MT SOLN
15.0000 mL | Freq: Two times a day (BID) | OROMUCOSAL | Status: DC
  Administered 2016-06-04 – 2016-06-07 (×8): 15 mL via OROMUCOSAL
  Filled 2016-06-04 (×6): qty 15

## 2016-06-04 MED ORDER — METOPROLOL TARTRATE 5 MG/5ML IV SOLN
2.5000 mg | Freq: Three times a day (TID) | INTRAVENOUS | Status: DC
  Administered 2016-06-04 – 2016-06-05 (×6): 2.5 mg via INTRAVENOUS
  Filled 2016-06-04 (×6): qty 5

## 2016-06-04 MED ORDER — HYDRALAZINE HCL 20 MG/ML IJ SOLN
5.0000 mg | Freq: Four times a day (QID) | INTRAMUSCULAR | Status: DC | PRN
  Administered 2016-06-04: 5 mg via INTRAVENOUS
  Filled 2016-06-04: qty 1

## 2016-06-04 MED ORDER — ENOXAPARIN SODIUM 40 MG/0.4ML ~~LOC~~ SOLN
40.0000 mg | SUBCUTANEOUS | Status: DC
  Administered 2016-06-04 – 2016-06-07 (×4): 40 mg via SUBCUTANEOUS
  Filled 2016-06-04 (×4): qty 0.4

## 2016-06-04 MED ORDER — LEVOTHYROXINE SODIUM 100 MCG IV SOLR
50.0000 ug | Freq: Every day | INTRAVENOUS | Status: DC
  Administered 2016-06-04 – 2016-06-05 (×2): 50 ug via INTRAVENOUS
  Filled 2016-06-04 (×3): qty 5

## 2016-06-04 MED ORDER — ACETAMINOPHEN 160 MG/5ML PO SOLN
650.0000 mg | Freq: Four times a day (QID) | ORAL | Status: DC | PRN
Start: 2016-06-04 — End: 2016-06-05

## 2016-06-04 MED ORDER — VANCOMYCIN HCL 500 MG IV SOLR
500.0000 mg | Freq: Two times a day (BID) | INTRAVENOUS | Status: DC
  Administered 2016-06-04 – 2016-06-06 (×5): 500 mg via INTRAVENOUS
  Filled 2016-06-04 (×5): qty 500

## 2016-06-04 NOTE — H&P (Signed)
History and Physical    Dawn [name] [id number] DOB: 18-Mar-1946 DOA: 06/03/2016  PCP: Hennie Duos, MD  Patient coming from: Nursing home.  Chief Complaint: Nausea vomiting and fever.  HPI: Dawn [name] is a 70 y.o. female with mental retardation and dementia, seizure disorder, hypothyroidism and paroxysmal atrial fibrillation was brought to the ER after patient had nausea vomiting followed by fever and chills. In the ER patient was found to be tachycardic with lactic acid around 8 and blood work showed leukocytosis. Patient was given fluid bolus for sepsis protocol. Chest x-ray shows infiltrates which possibly could be the source of infection. Patient is noncommunicative and does not provide history. Most of the history was obtained from the ER physician and ER nurse. On exam patient is not in distress and has productive cough and PEG tube is seen in place.   ED Course: Fluid bolus for sepsis protocol was given and started on empiric antibiotics for pneumonia.  Review of Systems: As per HPI, rest all negative.   Past Medical History:  Diagnosis Date  . Anemia   . Anginal pain ([place])   . Cataract   . Cataracts, bilateral   . CHF (congestive heart failure) ([place])   . Coronary artery disease   . Dementia   . Dental caries   . Dysphagia   . Esophageal reflux   . High cholesterol   . Hyperlipidemia   . Hypotension   . Hypothyroidism   . Optic atrophy   . Optic atrophy   . Osteoporosis   . 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 Paroxysmal atrial fibrillation (HCC)   . Seizures ([place])   . Sepsis ([place]) 01/2016  . Thyroid disease    hypothyroid  . Unspecified convulsions ([place])     Past Surgical History:  Procedure Laterality Date  . CENTRAL VENOUS CATHETER INSERTION  05/15/2014      . ESOPHAGOGASTRODUODENOSCOPY N/A 06/03/2014   Procedure: ESOPHAGOGASTRODUODENOSCOPY (EGD);  Surgeon: Beryle Beams, MD;  Location: [facility] ENDOSCOPY;  Service: Endoscopy;  Laterality: N/A;  .  GASTROSTOMY TUBE PLACEMENT    . midline incision       reports that she has never smoked. She has never used smokeless tobacco. She reports that she does not drink alcohol or use drugs.  Allergies  Allergen Reactions  . Carvedilol   . Ppd [Tuberculin Purified Protein Derivative]     Per MAR    Family History  Problem Relation Age of Onset  . Colon cancer Mother   . Diabetes type II Sister     Prior to Admission medications   Medication Sig Start Date End Date Taking? Authorizing Provider  Amino Acids-Protein Hydrolys (FEEDING SUPPLEMENT, PRO-STAT SUGAR FREE 64,) LIQD Place 30 mLs into feeding tube daily. 02/14/16  Yes Ripudeep Krystal Eaton, MD  clonazePAM (KLONOPIN) 1 MG tablet Place 1 mg into feeding tube every 8 (eight) hours.   Yes Historical Provider, MD  docusate (COLACE) 50 MG/5ML liquid Place 20 mLs (200 mg total) into feeding tube 2 (two) times daily. 06/09/14  Yes Bonnielee Haff, MD  famotidine (PEPCID) 20 MG tablet Give 1 tablet via peg tube daily ( 20 mg )    Yes Historical Provider, MD  furosemide (LASIX) 20 MG tablet 20 mg by PEG Tube route 2 (two) times daily.   Yes Historical Provider, MD  levETIRAcetam (KEPPRA) 100 MG/ML solution Place 5 mLs (500 mg total) into feeding tube daily at 6 (six) AM. 06/09/14  Yes  Bonnielee Haff, MD  levothyroxine (SYNTHROID, LEVOTHROID) 100 MCG tablet 100 mcg by PEG Tube route daily before breakfast.   Yes Historical Provider, MD  metoprolol tartrate (LOPRESSOR) 25 MG tablet 12.5 mg by PEG Tube route 3 (three) times daily.   Yes Historical Provider, MD  Multiple Vitamins-Minerals (CENTAMIN) LIQD Place 5 mLs into feeding tube daily.   Yes Historical Provider, MD  Nutritional Supplements (NUTRITIONAL SUPPLEMENT PO) Two cal hn via peg at 39 cc/hr x 24 hours continuously   Yes Historical Provider, MD  potassium chloride 20 MEQ/15ML (10%) SOLN 20 mEq by PEG Tube route daily. Dilute with water before administered.   Yes Historical Provider, MD  Valproic  Acid (DEPAKENE) 250 MG/5ML SYRP syrup Place 15 mLs (750 mg total) into feeding tube 3 (three) times daily. 06/09/14  Yes Bonnielee Haff, MD    Physical Exam: Vitals:   06/03/16 2315 06/03/16 2331 06/04/16 0000 06/04/16 0042  BP:  141/86 131/76 (!) 156/74  Pulse: 119 (!) 121 113 (!) 109  Resp:   (!) 43 (!) 27  Temp:      TempSrc:      SpO2: 95% 95% 95% 94%  Weight:    147 lb 4.3 oz (66.8 kg)  Height:    5\' 2"  (1.575 m)      Constitutional: Not in distress. Vitals:   06/03/16 2315 06/03/16 2331 06/04/16 0000 06/04/16 0042  BP:  141/86 131/76 (!) 156/74  Pulse: 119 (!) 121 113 (!) 109  Resp:   (!) 43 (!) 27  Temp:      TempSrc:      SpO2: 95% 95% 95% 94%  Weight:    147 lb 4.3 oz (66.8 kg)  Height:    5\' 2"  (1.575 m)   Eyes: Anicteric no pallor. ENMT: No discharge from the years eyes nose and mouth. Neck: No mass felt. No neck rigidity. Respiratory: No rhonchi or crepitations. Cardiovascular: S1 and S2 heard. Abdomen: PEG tube seen. Soft nontender. Musculoskeletal: No edema. Skin: No rash. Neurologic: Patient is noncommunicative and does not follow commands. Psychiatric: Patient is noncommunicative and does not follow commands.   Labs on Admission: I have personally reviewed following labs and imaging studies  CBC:  Recent Labs Lab 06/03/16 2204  WBC 35.0*  NEUTROABS 31.8*  HGB 14.8  HCT 45.5  MCV 94.0  PLT 123XX123   Basic Metabolic Panel:  Recent Labs Lab 06/03/16 2204  NA 137  K 4.6  CL 100*  CO2 23  GLUCOSE 120*  BUN 22*  CREATININE 0.67  CALCIUM 9.2   GFR: Estimated Creatinine Clearance: 59.5 mL/min (by C-G formula based on SCr of 0.8 mg/dL). Liver Function Tests: No results for input(s): AST, ALT, ALKPHOS, BILITOT, PROT, ALBUMIN in the last 168 hours. No results for input(s): LIPASE, AMYLASE in the last 168 hours. No results for input(s): AMMONIA in the last 168 hours. Coagulation Profile: No results for input(s): INR, PROTIME in the last 168  hours. Cardiac Enzymes: No results for input(s): CKTOTAL, CKMB, CKMBINDEX, TROPONINI in the last 168 hours. BNP (last 3 results) No results for input(s): PROBNP in the last 8760 hours. HbA1C: No results for input(s): HGBA1C in the last 72 hours. CBG: No results for input(s): GLUCAP in the last 168 hours. Lipid Profile: No results for input(s): CHOL, HDL, LDLCALC, TRIG, CHOLHDL, LDLDIRECT in the last 72 hours. Thyroid Function Tests: No results for input(s): TSH, T4TOTAL, FREET4, T3FREE, THYROIDAB in the last 72 hours. Anemia Panel: No results for input(s):  VITAMINB12, FOLATE, FERRITIN, TIBC, IRON, RETICCTPCT in the last 72 hours. Urine analysis:    Component Value Date/Time   COLORURINE YELLOW 06/03/2016 2240   APPEARANCEUR CLOUDY (A) 06/03/2016 2240   LABSPEC 1.020 06/03/2016 2240   PHURINE 6.0 06/03/2016 2240   GLUCOSEU NEGATIVE 06/03/2016 2240   HGBUR NEGATIVE 06/03/2016 2240   BILIRUBINUR NEGATIVE 06/03/2016 2240   KETONESUR NEGATIVE 06/03/2016 2240   PROTEINUR NEGATIVE 06/03/2016 2240   UROBILINOGEN 1.0 11/02/2014 0924   NITRITE NEGATIVE 06/03/2016 2240   LEUKOCYTESUR NEGATIVE 06/03/2016 2240   Sepsis Labs: @LABRCNTIP (procalcitonin:4,lacticidven:4) )No results found for this or any previous visit (from the past 240 hour(s)).   Radiological Exams on Admission: Dg Chest 1 View  Result Date: 06/03/2016 CLINICAL DATA:  Vomiting.  Concern for aspiration. EXAM: CHEST 1 VIEW COMPARISON:  Chest radiograph 02/08/2016 FINDINGS: Examination is limited by patient positioning. Patient is markedly rotated to the right. There is shallow lung inflation. Cardiomediastinal silhouette is largely obscured by what is favored to represent extra thoracic soft tissue. This supine radiograph shows no large pleural effusion or pneumothorax. Hazy opacities at the left lung base are similar to the prior examination. No lobar consolidation. IMPRESSION: 1. Examination limited by patient positioning. 2.  Persistent shallow lung inflation and hazy opacities at the left lung base. No other focal airspace disease. Electronically Signed   By: Ulyses Jarred M.D.   On: 06/03/2016 22:43     Assessment/Plan Principal Problem:   Sepsis ([place]) Active Problems:   Hypothyroidism   Paroxysmal a-fib (HCC)   PEG (percutaneous endoscopic gastrostomy) status ([place])   HCAP (healthcare-associated pneumonia)   Severe dementia   Seizures ([place])    1. Sepsis probably from aspiration pneumonia - patient has been placed on empiric antibiotics for healthcare associated pneumonia. Since patient had nausea vomiting will check abdominal x-ray. For now I have kept patient nothing by mouth and check CBGs every 4 hourly. All medications through IV. If abdominal x-ray shows PEG tube is in good position and no bowel obstruction then restart tube feedings. Follow blood cultures and lactate levels and procalcitonin levels and continue hydration. 2. History of seizures - I have placed patient on IV Keppra and Depakote until patient can take through the PEG tube. 3. Hypothyroidism - for now I have placed patient on IV Synthroid. 4. Paroxysmal atrial fibrillation presently in sinus tachycardia - patient is usually on metoprolol which I have dosed IV metoprolol 2.5 mg every week. Change to oral PEG tube dose once patient's PEG tube position is confirmed through KUB. Not on any anticoagulants reason not clear. 5. Severe mental retardation and dementia.   DVT prophylaxis: Lovenox. Code Status: DO NOT RESUSCITATE.  Family Communication: No family at the bedside.  Disposition Plan: Skilled nursing facility.  Consults called: None.  Admission status: Inpatient. Stepdown.    Rise Patience MD [facility] Pager [phone].  If 7PM-7AM, please contact night-coverage [email or web address] Password [facility]  06/04/2016, 12:44 AM

## 2016-06-04 NOTE — Progress Notes (Signed)
CRITICAL VALUE ALERT  Critical value received: Lactic Acid 3.4   Date of notification:  06/04/16   Time of notification:  [id number]  Critical value read back:Yes.    Nurse who received alert:  Erling Conte, RN  MD notified (1st page):  Hal Hope  Time of first page:  0300  MD notified (2nd page):  Time of second page:  Responding MD:  Hal Hope  Time MD responded:  [phone]

## 2016-06-04 NOTE — Progress Notes (Signed)
Pharmacy Antibiotic Note  Dawn [name] is a 70 y.o. female admitted on 06/03/2016 with pneumonia and sepsis.  Pharmacy has been consulted for Vancomycin, cefepime dosing.  Plan: Vancomycin 500mg  IV every 12 hours.  Goal trough 15-20 mcg/mL.  Height: 5\' 2"  (157.5 cm) Weight: 147 lb 4.3 oz (66.8 kg) IBW/kg (Calculated) : 50.1  Temp (24hrs), Avg:100.2 F (37.9 C), Min:97.2 F (36.2 C), Max:103 F (39.4 C)   Recent Labs Lab 06/03/16 2204 06/03/16 2219 06/04/16 0212  WBC 35.0*  --   --   CREATININE 0.67  --   --   LATICACIDVEN  --  8.63* 3.4*    Estimated Creatinine Clearance: 59.5 mL/min (by C-G formula based on SCr of 0.8 mg/dL).    Allergies  Allergen Reactions  . Carvedilol   . Ppd [Tuberculin Purified Protein Derivative]     Per MAR    Antimicrobials this admission: Vancomycin 06/03/2016 >> Zosyn 06/03/2016 x1 Cefepime 06/04/2016 >>   Dose adjustments this admission: -  Microbiology results: pending  Thank you for allowing pharmacy to be a part of this patient's care.  Nani Skillern Crowford 06/04/2016 5:59 AM

## 2016-06-04 NOTE — Progress Notes (Signed)
Patient admitted after midnight, please see H&P.  Continue IVF for elevated lactic acid.  Patient is DNR and has been seen by palliative care in 2015.  If limited or no improvement with abx will re-consult.  Continue IV abx and repeat labs in AM  Eulogio Bear DO

## 2016-06-05 DIAGNOSIS — I480 Paroxysmal atrial fibrillation: Secondary | ICD-10-CM

## 2016-06-05 DIAGNOSIS — E034 Atrophy of thyroid (acquired): Secondary | ICD-10-CM

## 2016-06-05 DIAGNOSIS — E038 Other specified hypothyroidism: Secondary | ICD-10-CM

## 2016-06-05 DIAGNOSIS — J189 Pneumonia, unspecified organism: Secondary | ICD-10-CM

## 2016-06-05 DIAGNOSIS — Z931 Gastrostomy status: Secondary | ICD-10-CM

## 2016-06-05 LAB — CBC
HEMATOCRIT: 38.7 % (ref 36.0–46.0)
HEMOGLOBIN: 12.7 g/dL (ref 12.0–15.0)
MCH: 30.3 pg (ref 26.0–34.0)
MCHC: 32.8 g/dL (ref 30.0–36.0)
MCV: 92.4 fL (ref 78.0–100.0)
Platelets: 252 10*3/uL (ref 150–400)
RBC: 4.19 MIL/uL (ref 3.87–5.11)
RDW: 14.9 % (ref 11.5–15.5)
WBC: 31.9 10*3/uL — ABNORMAL HIGH (ref 4.0–10.5)

## 2016-06-05 LAB — BASIC METABOLIC PANEL
Anion gap: 6 (ref 5–15)
BUN: 15 mg/dL (ref 4–21)
BUN: 15 mg/dL (ref 6–20)
CHLORIDE: 109 mmol/L (ref 101–111)
CO2: 23 mmol/L (ref 22–32)
Calcium: 8.6 mg/dL — ABNORMAL LOW (ref 8.9–10.3)
Creatinine, Ser: 0.46 mg/dL (ref 0.44–1.00)
Creatinine: 0.5 mg/dL (ref 0.5–1.1)
GFR calc Af Amer: >60 mL/min (ref >60)
GFR calc non Af Amer: >60 mL/min (ref >60)
GLUCOSE: 80 mg/dL (ref 65–99)
Glucose: 80 mg/dL
POTASSIUM: 4 mmol/L (ref 3.5–5.1)
Potassium: 4 mmol/L (ref 3.4–5.3)
SODIUM: 138 mmol/L (ref 137–147)
Sodium: 138 mmol/L (ref 135–145)

## 2016-06-05 LAB — CBC AND DIFFERENTIAL
HCT: 39 % (ref 36–46)
Hemoglobin: 12.7 g/dL (ref 12.0–16.0)
Platelets: 252 10*3/uL (ref 150–399)
WBC: 31.9 10*3/mL

## 2016-06-05 LAB — GLUCOSE, CAPILLARY
GLUCOSE-CAPILLARY: 88 mg/dL (ref 65–99)
GLUCOSE-CAPILLARY: 80 mg/dL (ref 65–99)
Glucose-Capillary: 101 mg/dL — ABNORMAL HIGH (ref 65–99)
Glucose-Capillary: 108 mg/dL — ABNORMAL HIGH (ref 65–99)
Glucose-Capillary: 84 mg/dL (ref 65–99)
Glucose-Capillary: 93 mg/dL (ref 65–99)

## 2016-06-05 LAB — LACTIC ACID, PLASMA: Lactic Acid, Venous: 1.8 mmol/L (ref 0.5–1.9)

## 2016-06-05 MED ORDER — FREE WATER
100.0000 mL | Status: DC
  Administered 2016-06-05 – 2016-06-07 (×14): 100 mL

## 2016-06-05 MED ORDER — JEVITY 1.2 CAL PO LIQD
1000.0000 mL | ORAL | Status: DC
  Administered 2016-06-05: 1000 mL
  Filled 2016-06-05: qty 1000

## 2016-06-05 MED ORDER — TWOCAL HN PO LIQD
237.0000 mL | ORAL | Status: DC
  Administered 2016-06-05 – 2016-06-07 (×3): 237 mL
  Filled 2016-06-05 (×3): qty 948

## 2016-06-05 MED ORDER — CLONAZEPAM 1 MG PO TABS
1.0000 mg | ORAL_TABLET | Freq: Three times a day (TID) | ORAL | Status: DC
  Administered 2016-06-05 – 2016-06-07 (×7): 1 mg
  Filled 2016-06-05 (×7): qty 1

## 2016-06-05 MED ORDER — METOPROLOL TARTRATE 25 MG PO TABS
12.5000 mg | ORAL_TABLET | Freq: Three times a day (TID) | ORAL | Status: DC
  Administered 2016-06-05 – 2016-06-07 (×6): 12.5 mg via ORAL
  Filled 2016-06-05 (×6): qty 1

## 2016-06-05 NOTE — Progress Notes (Signed)
Pharmacy Antibiotic Note  Dawn [name] is a 70 y.o. female admitted on 06/03/2016 with pneumonia and sepsis.  Pharmacy has been consulted for Vancomycin, cefepime dosing.  Today, 06/05/16  Afeb. WBC improved. SCr stable  Urine Cx with gram positive cocci - final pending  MRSA PCR neg  Plan: 1) Continue Vancomycin 500mg  IV q12 per current weight and renal function 2) Will check a vanc trough prior to tonight's dose at 10pm (prior to 5th total dose) 3) Continue cefepime 1g IV q8 per current renal function  Height: 5\' 2"  (157.5 cm) Weight: 147 lb 14.9 oz (67.1 kg) IBW/kg (Calculated) : 50.1  Temp (24hrs), Avg:98.3 F (36.8 C), Min:97.7 F (36.5 C), Max:99 F (37.2 C)   Recent Labs Lab 06/03/16 2204 06/03/16 2219 06/04/16 0212 06/04/16 0657 06/05/16 0540  WBC 35.0*  --   --  42.4* 31.9*  CREATININE 0.67  --   --  0.57 0.46  LATICACIDVEN  --  8.63* 3.4* 3.2* 1.8    Estimated Creatinine Clearance: 59.6 mL/min (by C-G formula based on SCr of 0.8 mg/dL).    Allergies  Allergen Reactions  . Carvedilol   . Ppd [Tuberculin Purified Protein Derivative]     Per MAR    Antimicrobials this admission: Vancomycin 06/03/2016 >> Zosyn 06/03/2016 x1 Cefepime 06/04/2016 >>   Dose adjustments this admission: -  Microbiology results: 8/21 BCx: sent 8/21 UCx: >100k GPC 8/21 MRSA PCR: neg  Thank you for allowing pharmacy to be a part of this patient's care.  Adrian Saran, PharmD, BCPS Pager [phone] 06/05/2016 10:35 AM

## 2016-06-05 NOTE — Progress Notes (Signed)
PROGRESS NOTE    Dawn [name]  [id number] DOB: 1945/11/03 DOA: 06/03/2016 PCP: Hennie Duos, MD   Outpatient Specialists:      Brief Narrative:  Dawn [name] is a 70 y.o. female with mental retardation and dementia, seizure disorder, hypothyroidism and paroxysmal atrial fibrillation was brought to the ER after patient had nausea vomiting followed by fever and chills. In the ER patient was found to be tachycardic with lactic acid around 8 and blood work showed leukocytosis. Patient was given fluid bolus for sepsis protocol. Chest x-ray shows infiltrates which possibly could be the source of infection. Patient is noncommunicative and does not provide history. Most of the history was obtained from the ER physician and ER nurse. On exam patient is not in distress and has productive cough and PEG tube is seen in place.    Assessment & Plan:   Principal Problem:   Sepsis (Dawn [name]) Active Problems:   Hypothyroidism   Paroxysmal a-fib (HCC)   PEG (percutaneous endoscopic gastrostomy) status ([place])   HCAP (healthcare-associated pneumonia)   Severe dementia   Seizures (HCC)   Sepsis  -?aspiration pneumonia  -restart tube feeds  History of seizures -  - IV Keppra and Depakote  -change to PO once able to tolerate tube feeds  Hypothyroidism - for now I have placed patient on IV Synthroid.  Paroxysmal atrial fibrillation -resume home PEG tube dose  Urine with gram + cocci- await culture   DVT prophylaxis:  Lovenox   Code Status: DNR   Family Communication: Sister- MPOA  Disposition Plan:     Consultants:     Procedures:        Subjective: Opens eyes but not verbal-- sister states this is her baseline  Objective: Vitals:   06/04/16 1915 06/05/16 0500 06/05/16 0526 06/05/16 1336  BP: (!) 157/58  133/81 109/78  Pulse: (!) 108  (!) 113 (!) 117  Resp: (!) 22     Temp: 97.7 F (36.5 C)  99 F (37.2 C) 98 F (36.7 C)  TempSrc: Oral   Axillary Axillary  SpO2: 97%  97% 98%  Weight:  67.1 kg (147 lb 14.9 oz)    Height:        Intake/Output Summary (Last 24 hours) at 06/05/16 1515 Last data filed at 06/05/16 0545  Gross per 24 hour  Intake              815 ml  Output                0 ml  Net              815 ml   Filed Weights   06/04/16 0042 06/04/16 0500 06/05/16 0500  Weight: 66.8 kg (147 lb 4.3 oz) 67 kg (147 lb 11.3 oz) 67.1 kg (147 lb 14.9 oz)    Examination:  General exam: chronically ill appearing, drool from right side of mouth Respiratory system: Coarse breath sounds Cardiovascular system: tachy Gastrointestinal system: Abdomen is nondistended, soft and nontender. No organomegaly or masses felt. Normal bowel sounds heard. Central nervous system: Alert and oriented. No focal neurological deficits. Extremities: contracted, did not follow commands    Data Reviewed: I have personally reviewed following labs and imaging studies  CBC:  Recent Labs Lab 06/03/16 2204 06/04/16 0657 06/05/16 0540  WBC 35.0* 42.4* 31.9*  NEUTROABS 31.8* 37.4*  --   HGB 14.8 12.9 12.7  HCT 45.5 39.5 38.7  MCV 94.0 93.6 92.4  PLT 297 275  [id number]   Basic Metabolic Panel:  Recent Labs Lab 06/03/16 2204 06/04/16 0657 06/05/16 0540  NA 137 136 138  K 4.6 4.2 4.0  CL 100* 107 109  CO2 23 23 23   GLUCOSE 120* 117* 80  BUN 22* 17 15  CREATININE 0.67 0.57 0.46  CALCIUM 9.2 8.4* 8.6*   GFR: Estimated Creatinine Clearance: 59.6 mL/min (by C-G formula based on SCr of 0.8 mg/dL). Liver Function Tests:  Recent Labs Lab 06/04/16 0657  AST 30  ALT 18  ALKPHOS 91  BILITOT 0.5  PROT 7.4  ALBUMIN 2.5*   No results for input(s): LIPASE, AMYLASE in the last 168 hours. No results for input(s): AMMONIA in the last 168 hours. Coagulation Profile: No results for input(s): INR, PROTIME in the last 168 hours. Cardiac Enzymes: No results for input(s): CKTOTAL, CKMB, CKMBINDEX, TROPONINI in the last 168 hours. BNP (last 3  results) No results for input(s): PROBNP in the last 8760 hours. HbA1C: No results for input(s): HGBA1C in the last 72 hours. CBG:  Recent Labs Lab 06/04/16 1959 06/05/16 0059 06/05/16 0439 06/05/16 0750 06/05/16 1157  GLUCAP 85 93 84 88 80   Lipid Profile: No results for input(s): CHOL, HDL, LDLCALC, TRIG, CHOLHDL, LDLDIRECT in the last 72 hours. Thyroid Function Tests: No results for input(s): TSH, T4TOTAL, FREET4, T3FREE, THYROIDAB in the last 72 hours. Anemia Panel: No results for input(s): VITAMINB12, FOLATE, FERRITIN, TIBC, IRON, RETICCTPCT in the last 72 hours. Urine analysis:    Component Value Date/Time   COLORURINE YELLOW 06/03/2016 2240   APPEARANCEUR CLOUDY (A) 06/03/2016 2240   LABSPEC 1.020 06/03/2016 2240   PHURINE 6.0 06/03/2016 2240   GLUCOSEU NEGATIVE 06/03/2016 2240   HGBUR NEGATIVE 06/03/2016 2240   BILIRUBINUR NEGATIVE 06/03/2016 2240   KETONESUR NEGATIVE 06/03/2016 2240   PROTEINUR NEGATIVE 06/03/2016 2240   UROBILINOGEN 1.0 11/02/2014 0924   NITRITE NEGATIVE 06/03/2016 2240   LEUKOCYTESUR NEGATIVE 06/03/2016 2240     ) Recent Results (from the past 240 hour(s))  Urine culture     Status: Abnormal (Preliminary result)   Collection Time: 06/03/16 10:40 PM  Result Value Ref Range Status   Specimen Description URINE, RANDOM  Final   Special Requests NONE  Final   Culture >=100,000 COLONIES/mL GRAM POSITIVE COCCI (A)  Final   Report Status PENDING  Incomplete  MRSA PCR Screening     Status: None   Collection Time: 06/04/16 12:55 AM  Result Value Ref Range Status   MRSA by PCR NEGATIVE NEGATIVE Final    Comment:        The GeneXpert MRSA Assay (FDA approved for NASAL specimens only), is one component of a comprehensive MRSA colonization surveillance program. It is not intended to diagnose MRSA infection nor to guide or monitor treatment for MRSA infections.       Anti-infectives    Start     Dose/Rate Route Frequency Ordered Stop    06/04/16 1000  vancomycin (VANCOCIN) 500 mg in sodium chloride 0.9 % 100 mL IVPB     500 mg 100 mL/hr over 60 Minutes Intravenous Every 12 hours 06/04/16 0553     06/04/16 0100  ceFEPIme (MAXIPIME) 1 g in dextrose 5 % 50 mL IVPB     1 g 100 mL/hr over 30 Minutes Intravenous Every 8 hours 06/04/16 0055     06/03/16 2230  piperacillin-tazobactam (ZOSYN) IVPB 3.375 g     3.375 g 100 mL/hr over 30 Minutes Intravenous  Once 06/03/16 2226 06/03/16  2300   06/03/16 2230  vancomycin (VANCOCIN) IVPB 1000 mg/200 mL premix     1,000 mg 200 mL/hr over 60 Minutes Intravenous  Once 06/03/16 2226 06/03/16 2359       Radiology Studies: Dg Chest 1 View  Result Date: 06/03/2016 CLINICAL DATA:  Vomiting.  Concern for aspiration. EXAM: CHEST 1 VIEW COMPARISON:  Chest radiograph 02/08/2016 FINDINGS: Examination is limited by patient positioning. Patient is markedly rotated to the right. There is shallow lung inflation. Cardiomediastinal silhouette is largely obscured by what is favored to represent extra thoracic soft tissue. This supine radiograph shows no large pleural effusion or pneumothorax. Hazy opacities at the left lung base are similar to the prior examination. No lobar consolidation. IMPRESSION: 1. Examination limited by patient positioning. 2. Persistent shallow lung inflation and hazy opacities at the left lung base. No other focal airspace disease. Electronically Signed   By: Ulyses Jarred M.D.   On: 06/03/2016 22:43   Dg Abd 1 View  Result Date: 06/04/2016 CLINICAL DATA:  Nausea and vomiting.  Cerebral palsy. EXAM: ABDOMEN - 1 VIEW COMPARISON:  Abdominal radiograph 05/12/2016 FINDINGS: A gastrostomy tube is again seen overlying the stomach. There are cholecystectomy clips in the right lower quadrant. Bilateral lower quadrant surgical clips are again noted. No dilated small bowel is identified. There is stool seen within the rectum. IMPRESSION: No radiographic evidence of small-bowel obstruction.  Electronically Signed   By: Ulyses Jarred M.D.   On: 06/04/2016 05:46        Scheduled Meds: . antiseptic oral rinse  7 mL Mouth Rinse q12n4p  . ceFEPime (MAXIPIME) IV  1 g Intravenous Q8H  . chlorhexidine  15 mL Mouth Rinse BID  . enoxaparin (LOVENOX) injection  40 mg Subcutaneous Q24H  . free water  100 mL Per Tube Q4H  . levETIRAcetam  500 mg Intravenous Q24H  . levothyroxine  50 mcg Intravenous Daily  . metoprolol  2.5 mg Intravenous Q8H  . valproate sodium  750 mg Intravenous Q8H  . vancomycin  500 mg Intravenous Q12H   Continuous Infusions: . TWOCAL HN 237 mL (06/05/16 1409)     LOS: 2 days    Time spent: 35 min    [place], DO [facility] Pager [phone]  If 7PM-7AM, please contact night-coverage [email or web address] Password [facility] 06/05/2016, 3:15 PM

## 2016-06-05 NOTE — Progress Notes (Signed)
Initial Nutrition Assessment  DOCUMENTATION CODES:   Not applicable  INTERVENTION:  - Pour 2 cans of TwoCal HN in feeding bag every 12 hours (4 cans every 24 hours) and run via pump at @ 40 mL/hr. This regimen will provide 1920 kcal, 80 grams of protein, and 664 mL free water. - Will order 100 mL free water every 4 hours which will provide an additional 600 mL/day.  - RD will follow-up 8/23.  NUTRITION DIAGNOSIS:   Inadequate oral intake related to inability to eat as evidenced by NPO status.   GOAL:   Patient will meet greater than or equal to 90% of their needs  MONITOR:   TF tolerance, Weight trends, Labs, Skin, I & O's  REASON FOR ASSESSMENT:   Low [place], Consult Enteral/tube feeding initiation and management  ASSESSMENT:   70 y.o. female with mental retardation and dementia, seizure disorder, hypothyroidism and paroxysmal atrial fibrillation was brought to the ER after patient had nausea vomiting followed by fever and chills. In the ER patient was found to be tachycardic with lactic acid around 8 and blood work showed leukocytosis. Patient was given fluid bolus for sepsis protocol. Chest x-ray shows infiltrates which possibly could be the source of infection. Patient is noncommunicative and does not provide history. Most of the history was obtained from the ER physician and ER nurse. On exam patient is not in distress and has productive cough and PEG tube is seen in place  Pt seen for consult for TF initiation and management. BMI indicates overweight status. Of note, pt was given 30 mL IVF/kg body weight in the ED. Weight up 3.3 kg from 8/20-8/21 following this. Used admission weight of 63.5 kg to calculate needs; will monitor weight trends and adjust as needed.   No family/visitors present at time of RD visit. Notes indicate that pt has hx of CP and MR and is nonverbal. She was sleeping at time of visit; did not perform physical assessment at that time with respect to pt's  comfort. Will attempt to complete at follow-up. Per chart review, some weight fluctuations over the past 2 years but pt mainly 60.8-64.7 kg since January 2015.   Per review, TF regimen PTA was TwoCal HN @ 39 mL/hr (4 cans) over 24 hours with 30 mL Prostat once/day via G-tube. This regimen provided 1972 kcal, 95 grams of protein, and 664 mL free water.  Spoke with Pharmacist who confirms this TF formula is available in cans here. Will order TF as outlined above and follow-up tomorrow.  Medications reviewed; 500 mg IV Keppra/day, 50 mcg IV Synthroid/day, PRN Zofran. Labs reviewed; CBGs: 84-93 mg/dL this AM, Ca: 8.6 mg/dL.    Diet Order:   NPO  Skin:   WDL per flowsheet but will monitor  Last BM:  PTA  Height:   Ht Readings from Last 1 Encounters:  06/04/16 5\' 2"  (1.575 m)    Weight:   Wt Readings from Last 1 Encounters:  06/05/16 147 lb 14.9 oz (67.1 kg)    Ideal Body Weight:  50 kg  BMI:  Body mass index is 27.06 kg/m.  Estimated Nutritional Needs:   Kcal:  1800-2000  Protein:  80-90 grams  Fluid:  >/= 1.6 L/day  EDUCATION NEEDS:   No education needs identified at this time    Jarome Matin, MS, RD, LDN Inpatient Clinical Dietitian Pager # [phone] After hours/weekend pager # [phone]

## 2016-06-05 NOTE — NC FL2 (Signed)
[place] LEVEL OF CARE SCREENING TOOL     IDENTIFICATION  Patient Name: Dawn [name] Birthdate: 06/28/1946 Sex: female Admission Date (Current Location): 06/03/2016  [place] and [place] Number:  [name] [id number] [place] and Address:  [facility],  [place], [place]      Provider Number: [id number]  Attending Physician Name and Address:  Geradine Girt, DO  Relative Name and Phone Number:  [name] - sister. Phone number [phone]    Current Level of Care: Hospital Recommended Level of Care: [place] Prior Approval Number:    Date Approved/Denied:   PASRR Number: [id number]  Discharge Plan: SNF    Current Diagnoses: Patient Active Problem List   Diagnosis Date Noted  . Seizures ([place]) 02/15/2016  . PNA (pneumonia) 02/08/2016  . Severe mental retardation 01/10/2015  . Severe dementia 01/10/2015  . Senile debility 01/10/2015  . 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 Acute respiratory failure with hypoxia ([place]) 11/20/2014  . Generalized weakness 11/20/2014  . Dysphagia 11/20/2014  . Dementia without behavioral disturbance 11/20/2014  . Development delay 11/02/2014  . Acute respiratory distress (HCC) 11/02/2014  . Paroxysmal ventricular tachycardia ([place]) 10/30/2014  . HCAP (healthcare-associated pneumonia) 10/26/2014  . Hematoma of right parietal scalp 08/13/2014  . Edema 08/09/2014  . Hematoma 08/05/2014  . Fall at nursing home 08/05/2014  . Leukocytosis 07/26/2014  . Tachycardia 07/11/2014  . Leukocytopenia, unspecified 07/11/2014  . Pneumonia 06/17/2014  . Sepsis ([place]) 06/12/2014  . Facial droop 06/12/2014  . Hypernatremia 06/12/2014  . PEG (percutaneous endoscopic gastrostomy) status ([place]) 06/12/2014  . FUO (fever of unknown origin) 06/02/2014  . Paroxysmal a-fib ([place]) 05/14/2014  . Dysphasia 05/14/2014  . UTI (lower urinary tract infection) 05/14/2014  . Tooth decay  05/14/2014  . SIRS (systemic inflammatory response syndrome) ([place]) 12/15/2013  . Thrombocytopenia ([place]) 10/29/2013  . Hypotension 10/29/2013  . Anemia of chronic disease 10/29/2013  . Diastolic heart failure, NYHA class 2 ([place]) 10/29/2013  . Atrial fibrillation with rapid ventricular response ([place]) 10/28/2013  . Seizure disorder ([place]) 10/28/2013  . Hypothyroidism 10/28/2013  . HLD (hyperlipidemia) 10/28/2013  . Non-ST elevation myocardial infarction (NSTEMI) ([place]) 10/28/2013    Orientation RESPIRATION BLADDER Height & Weight     Self  Normal Incontinent Weight: 147 lb 14.9 oz (67.1 kg) Height:  5\' 2"  (157.5 cm)  BEHAVIORAL SYMPTOMS/MOOD NEUROLOGICAL BOWEL NUTRITION STATUS    Convulsions/Seizures Incontinent Feeding tube  AMBULATORY STATUS COMMUNICATION OF NEEDS Skin   Extensive Assist Non-Verbally Normal                       Personal Care Assistance Level of Assistance  Bathing, Feeding, Dressing Bathing Assistance: Maximum assistance Feeding assistance: Limited assistance Dressing Assistance: Maximum assistance     Functional Limitations Info             SPECIAL CARE FACTORS FREQUENCY  Restorative feeding program                    Contractures      Additional Factors Info  Code Status, Allergies Code Status Info: DNR Allergies Info: Allergies:  Carvedilol, Ppd Tuberculin            Current Medications (06/05/2016):  This is the current hospital active medication list Current Facility-Administered Medications  Medication Dose Route Frequency Provider Last Rate Last Dose  . acetaminophen (TYLENOL) solution 650 mg  650 mg Oral Q6H PRN Geradine Girt, DO      .  acetaminophen (TYLENOL) tablet 650 mg  650 mg Oral Q6H PRN Rise Patience, MD       Or  . acetaminophen (TYLENOL) suppository 650 mg  650 mg Rectal Q6H PRN Rise Patience, MD      . antiseptic oral rinse (CPC / CETYLPYRIDINIUM CHLORIDE 0.05%) solution 7 mL  7 mL Mouth Rinse q12n4p  Rise Patience, MD      . ceFEPIme (MAXIPIME) 1 g in dextrose 5 % 50 mL IVPB  1 g Intravenous Q8H Rise Patience, MD   1 g at 06/05/16 0544  . chlorhexidine (PERIDEX) 0.12 % solution 15 mL  15 mL Mouth Rinse BID Rise Patience, MD   15 mL at 06/05/16 1015  . enoxaparin (LOVENOX) injection 40 mg  40 mg Subcutaneous Q24H Rise Patience, MD   40 mg at 06/05/16 1025  . free water 100 mL  100 mL Per Tube Q4H Jessica U Vann, DO   100 mL at 06/05/16 1045  . hydrALAZINE (APRESOLINE) injection 5 mg  5 mg Intravenous Q6H PRN Geradine Girt, DO   5 mg at 06/04/16 1021  . levETIRAcetam (KEPPRA) 500 mg in sodium chloride 0.9 % 100 mL IVPB  500 mg Intravenous Q24H Rise Patience, MD   500 mg at 06/05/16 1015  . levothyroxine (SYNTHROID, LEVOTHROID) injection 50 mcg  50 mcg Intravenous Daily Rise Patience, MD   50 mcg at 06/05/16 1015  . metoprolol (LOPRESSOR) injection 2.5 mg  2.5 mg Intravenous Q8H Rise Patience, MD   2.5 mg at 06/05/16 0544  . ondansetron (ZOFRAN) tablet 4 mg  4 mg Oral Q6H PRN Rise Patience, MD       Or  . ondansetron [facility]) injection 4 mg  4 mg Intravenous Q6H PRN Rise Patience, MD      . TWOCAL HN liquid 237 mL  237 mL Per Tube Continuous Geradine Girt, DO      . valproate (DEPACON) 750 mg in dextrose 5 % 50 mL IVPB  750 mg Intravenous Q8H Rise Patience, MD   750 mg at 06/05/16 1312  . vancomycin (VANCOCIN) 500 mg in sodium chloride 0.9 % 100 mL IVPB  500 mg Intravenous Q12H Geradine Girt, DO   500 mg at 06/05/16 1032     Discharge Medications: Please see discharge summary for a list of discharge medications.  Relevant Imaging Results:  Relevant Lab Results:   Additional Information SSN: [id number]  Standley Brooking, LCSW

## 2016-06-05 NOTE — Clinical Social Work Note (Signed)
Clinical Social Work Assessment  Patient Details  Name: Dawn [name] MRN: [id number] Date of Birth: 07-29-1946  Date of referral:  06/05/16               Reason for consult:  Facility Placement                Permission sought to share information with:  Facility Art therapist granted to share information::  Yes, Verbal Permission Granted  Name::        Agency::     Relationship::     Contact Information:     Housing/Transportation Living arrangements for the past 2 months:  [place] of Information:  Other (Comment Required) (patient's sister, [name] via phone) Patient Interpreter Needed:  None Criminal Activity/Legal Involvement Pertinent to Current Situation/Hospitalization:  No - Comment as needed Significant Relationships:  Siblings Lives with:  Facility Resident Do you feel safe going back to the place where you live?  Yes Need for family participation in patient care:  Yes (Comment)  Care giving concerns:  CSW received consult that patient was admitted from [facility].    Social Worker assessment / plan:  CSW confirmed with patient's sister, Bertram Millard via phone that patient is to return to [facility] SNF at discharge.   Employment status:    Forensic scientist:  Medicare, Medicaid In [place] PT Recommendations:  Not assessed at this time Information / Referral to community resources:     Patient/Family's Response to care:  Patient's sister states that she has been very pleased with the care she's been receiving at [facility].   Patient/Family's Understanding of and Emotional Response to Diagnosis, Current Treatment, and Prognosis:    Emotional Assessment Appearance:    Attitude/Demeanor/Rapport:    Affect (typically observed):    Orientation:  Oriented to Self Alcohol / Substance use:    Psych involvement (Current and /or in the community):     Discharge Needs  Concerns to be addressed:    Readmission within the  last 30 days:    Current discharge risk:    Barriers to Discharge:      Standley Brooking, LCSW 06/05/2016, 1:17 PM

## 2016-06-06 DIAGNOSIS — F0390 Unspecified dementia without behavioral disturbance: Secondary | ICD-10-CM

## 2016-06-06 LAB — CBC AND DIFFERENTIAL
HCT: 35 % — AB (ref 36–46)
HEMOGLOBIN: 11.8 g/dL — AB (ref 12.0–16.0)
PLATELETS: 205 10*3/uL (ref 150–399)
WBC: 25.8 10*3/mL

## 2016-06-06 LAB — BASIC METABOLIC PANEL
ANION GAP: 7 (ref 5–15)
BUN: 20 mg/dL (ref 4–21)
BUN: 20 mg/dL (ref 6–20)
CO2: 24 mmol/L (ref 22–32)
CREATININE: 0.5 mg/dL (ref 0.5–1.1)
Calcium: 8.6 mg/dL — ABNORMAL LOW (ref 8.9–10.3)
Chloride: 107 mmol/L (ref 101–111)
Creatinine, Ser: 0.46 mg/dL (ref 0.44–1.00)
GLUCOSE: 155 mg/dL — AB (ref 65–99)
Glucose: 155 mg/dL
POTASSIUM: 3.2 mmol/L — AB (ref 3.4–5.3)
POTASSIUM: 3.2 mmol/L — AB (ref 3.5–5.1)
Sodium: 138 mmol/L (ref 135–145)
Sodium: 138 mmol/L (ref 137–147)

## 2016-06-06 LAB — GLUCOSE, CAPILLARY
GLUCOSE-CAPILLARY: 101 mg/dL — AB (ref 65–99)
GLUCOSE-CAPILLARY: 104 mg/dL — AB (ref 65–99)
GLUCOSE-CAPILLARY: 124 mg/dL — AB (ref 65–99)
GLUCOSE-CAPILLARY: 131 mg/dL — AB (ref 65–99)

## 2016-06-06 LAB — CBC
HEMATOCRIT: 35.2 % — AB (ref 36.0–46.0)
Hemoglobin: 11.8 g/dL — ABNORMAL LOW (ref 12.0–15.0)
MCH: 30.5 pg (ref 26.0–34.0)
MCHC: 33.5 g/dL (ref 30.0–36.0)
MCV: 91 fL (ref 78.0–100.0)
Platelets: 205 10*3/uL (ref 150–400)
RBC: 3.87 MIL/uL (ref 3.87–5.11)
RDW: 15 % (ref 11.5–15.5)
WBC: 25.8 10*3/uL — AB (ref 4.0–10.5)

## 2016-06-06 LAB — VANCOMYCIN, TROUGH: Vancomycin Tr: 12 ug/mL — ABNORMAL LOW (ref 15–20)

## 2016-06-06 MED ORDER — POTASSIUM CHLORIDE 10 MEQ/100ML IV SOLN
10.0000 meq | INTRAVENOUS | Status: AC
  Administered 2016-06-06 (×4): 10 meq via INTRAVENOUS
  Filled 2016-06-06 (×4): qty 100

## 2016-06-06 MED ORDER — VALPROATE SODIUM 250 MG/5ML PO SOLN
750.0000 mg | Freq: Three times a day (TID) | ORAL | Status: DC
  Administered 2016-06-06 – 2016-06-07 (×4): 750 mg
  Filled 2016-06-06 (×6): qty 15

## 2016-06-06 MED ORDER — LEVOTHYROXINE SODIUM 100 MCG PO TABS
100.0000 ug | ORAL_TABLET | Freq: Every day | ORAL | Status: DC
  Administered 2016-06-06 – 2016-06-07 (×2): 100 ug
  Filled 2016-06-06 (×2): qty 1

## 2016-06-06 MED ORDER — IPRATROPIUM-ALBUTEROL 0.5-2.5 (3) MG/3ML IN SOLN: 3.0000 mL | Freq: Four times a day (QID) | RESPIRATORY_TRACT | Status: DC | PRN

## 2016-06-06 MED ORDER — AMOXICILLIN-POT CLAVULANATE 400-57 MG/5ML PO SUSR
875.0000 mg | Freq: Two times a day (BID) | ORAL | Status: DC
  Administered 2016-06-06 – 2016-06-07 (×3): 875 mg
  Filled 2016-06-06 (×5): qty 10.9

## 2016-06-06 MED ORDER — LEVETIRACETAM 100 MG/ML PO SOLN
500.0000 mg | Freq: Every day | ORAL | Status: DC
  Administered 2016-06-07: 500 mg
  Filled 2016-06-06: qty 5

## 2016-06-06 MED ORDER — AMOXICILLIN-POT CLAVULANATE 875-125 MG PO TABS: 1.0000 | ORAL_TABLET | Freq: Two times a day (BID) | ORAL | Status: DC

## 2016-06-06 NOTE — Progress Notes (Signed)
Pt very uncomfortable with chest vest and peg tube. No cough on request or spontaneously. Suction available for pt if needed. RT will continue to monitor.

## 2016-06-06 NOTE — Progress Notes (Signed)
Nutrition Follow-up  DOCUMENTATION CODES:   Not applicable  INTERVENTION:  - Continue TwoCal HN @ 40 mL/hr with 100 mL free water every 6 hours. - RD will follow-up 8/25.  NUTRITION DIAGNOSIS:   Inadequate oral intake related to inability to eat as evidenced by NPO status. -ongoing  GOAL:   Patient will meet greater than or equal to 90% of their needs -met with TF order.   MONITOR:   TF tolerance, Weight trends, Labs, Skin, I & O's  ASSESSMENT:   70 y.o. female with mental retardation and dementia, seizure disorder, hypothyroidism and paroxysmal atrial fibrillation was brought to the ER after patient had nausea vomiting followed by fever and chills. In the ER patient was found to be tachycardic with lactic acid around 8 and blood work showed leukocytosis. Patient was given fluid bolus for sepsis protocol. Chest x-ray shows infiltrates which possibly could be the source of infection. Patient is noncommunicative and does not provide history. Most of the history was obtained from the ER physician and ER nurse. On exam patient is not in distress and has productive cough and PEG tube is seen in place  8/23 Pt with PEG and currently receiving TF at goal rate order: TwoCal HN @ 40 mL/hr with 100 mL free water QID. This regimen is providing 1920 kcal, 80 grams of protein, and 1264 mL free water. RN in the room reports new bag for TF hung at 1000 and no issues with TF this shift. Per chart review, weight up 4.6 kg since admission; will continue to monitor weight trends and will adjust estimated needs/TF regimen if weight continues to trend up through follow-up.  Medications reviewed. Labs reviewed; CBGs: 101-131 mg/dL this AM, K: 3.2 mmol/L, Ca: 8.6 mg/dL.     8/22 - Of note, pt was given 30 mL IVF/kg body weight in the ED.  - Weight up 3.3 kg from 8/20-8/21 following this.  - Used admission weight of 63.5 kg to calculate needs; will monitor weight trends and adjust as needed.  - Notes  indicate that pt has hx of CP and MR and is nonverbal.  - She was sleeping at time of visit; did not perform physical assessment at that time with respect to pt's comfort.  - Per chart review, some weight fluctuations over the past 2 years but pt mainly 60.8-64.7 kg since January 2015.  - Per review, TF regimen PTA was TwoCal HN @ 39 mL/hr (4 cans) over 24 hours with 30 mL Prostat once/day via G-tube. This regimen provided 1972 kcal, 95 grams of protein, and 664 mL free water.  - Spoke with Pharmacist who confirms this TF formula is available in cans here.    Diet Order:   NPO  Skin:   WDL  Last BM:  PTA  Height:   Ht Readings from Last 1 Encounters:  06/04/16 '5\' 2"'  (1.575 m)    Weight:   Wt Readings from Last 1 Encounters:  06/06/16 150 lb 2.1 oz (68.1 kg)    Ideal Body Weight:  50 kg  BMI:  Body mass index is 27.46 kg/m.  Estimated Nutritional Needs:   Kcal:  1800-2000  Protein:  80-90 grams  Fluid:  >/= 1.6 L/day  EDUCATION NEEDS:   No education needs identified at this time    Jarome Matin, MS, RD, LDN Inpatient Clinical Dietitian Pager # [phone] After hours/weekend pager # [phone]

## 2016-06-06 NOTE — Progress Notes (Signed)
PROGRESS NOTE    Dawn [name]  [id number] DOB: [birth year] DOA: 06/03/2016 PCP: Hennie Duos, MD   Outpatient Specialists:      Brief Narrative:  Dawn [name] is a 70 y.o. female with mental retardation and dementia, seizure disorder, hypothyroidism and paroxysmal atrial fibrillation was brought to the ER after patient had nausea vomiting followed by fever and chills. In the ER patient was found to be tachycardic with lactic acid around 8 and blood work showed leukocytosis. Patient was given fluid bolus for sepsis protocol. Chest x-ray shows infiltrates which possibly could be the source of infection. Patient is noncommunicative and does not provide history. Most of the history was obtained from the ER physician and ER nurse. On exam patient is not in distress and has productive cough and PEG tube is seen in place.    Assessment & Plan:   Principal Problem:   Sepsis ([place]) Active Problems:   Hypothyroidism   Paroxysmal a-fib (HCC)   PEG (percutaneous endoscopic gastrostomy) status ([place])   HCAP (healthcare-associated pneumonia)   Severe dementia   Seizures (HCC)   Sepsis  -?aspiration pneumonia -- change to PO augmentin -restarted tube feeds-tolerated well  History of seizures -  - IV Keppra and Depakote- changed to PO  Hypothyroidism - resume home dose  Paroxysmal atrial fibrillation -resume home PEG tube dose  Urine with gram + cocci -suspect contaminant, will not treat   Hypokalemia  -replete  DVT prophylaxis:  Lovenox   Code Status: DNR   Family Communication: Sister- MPOA 8/22  Disposition Plan:   Back to SNF 8/24 if a febrile  Consultants:     Procedures:        Subjective: Appears more alert, moving around in bed  Objective: Vitals:   06/05/16 0526 06/05/16 1336 06/05/16 2156 06/06/16 0446  BP: 133/81 109/78 (!) 161/87 (!) 144/70  Pulse: (!) 113 (!) 117 (!) 116 (!) 107  Resp:   (!) 34 (!) 30  Temp: 99 F (37.2  C) 98 F (36.7 C) 97.9 F (36.6 C) 97.8 F (36.6 C)  TempSrc: Axillary Axillary Axillary Axillary  SpO2: 97% 98% 99% 96%  Weight:    68.1 kg (150 lb 2.1 oz)  Height:        Intake/Output Summary (Last 24 hours) at 06/06/16 1316 Last data filed at 06/06/16 1229  Gross per 24 hour  Intake          1264.83 ml  Output                0 ml  Net          1264.83 ml   Filed Weights   06/04/16 0500 06/05/16 0500 06/06/16 0446  Weight: 67 kg (147 lb 11.3 oz) 67.1 kg (147 lb 14.9 oz) 68.1 kg (150 lb 2.1 oz)    Examination:  General exam: chronically ill appearing, non-verbal but moving more in bed Respiratory system: Coarse breath sounds Cardiovascular system: tachy Gastrointestinal system: Abdomen is nondistended, soft and nontender. No organomegaly or masses felt. Normal bowel sounds heard. Central nervous system: Alert and oriented. No focal neurological deficits. Extremities: contracted, did not follow commands    Data Reviewed: I have personally reviewed following labs and imaging studies  CBC:  Recent Labs Lab 06/03/16 2204 06/04/16 0657 06/05/16 0540 06/06/16 0523  WBC 35.0* 42.4* 31.9* 25.8*  NEUTROABS 31.8* 37.4*  --   --   HGB 14.8 12.9 12.7 11.8*  HCT 45.5 39.5 38.7 35.2*  MCV 94.0 93.6 92.4 91.0  PLT 297 275 252 [id number]   Basic Metabolic Panel:  Recent Labs Lab 06/03/16 2204 06/04/16 0657 06/05/16 0540 06/06/16 0523  NA 137 136 138 138  K 4.6 4.2 4.0 3.2*  CL 100* 107 109 107  CO2 23 23 23 24   GLUCOSE 120* 117* 80 155*  BUN 22* 17 15 20   CREATININE 0.67 0.57 0.46 0.46  CALCIUM 9.2 8.4* 8.6* 8.6*   GFR: Estimated Creatinine Clearance: 60 mL/min (by C-G formula based on SCr of 0.8 mg/dL). Liver Function Tests:  Recent Labs Lab 06/04/16 0657  AST 30  ALT 18  ALKPHOS 91  BILITOT 0.5  PROT 7.4  ALBUMIN 2.5*   No results for input(s): LIPASE, AMYLASE in the last 168 hours. No results for input(s): AMMONIA in the last 168 hours. Coagulation  Profile: No results for input(s): INR, PROTIME in the last 168 hours. Cardiac Enzymes: No results for input(s): CKTOTAL, CKMB, CKMBINDEX, TROPONINI in the last 168 hours. BNP (last 3 results) No results for input(s): PROBNP in the last 8760 hours. HbA1C: No results for input(s): HGBA1C in the last 72 hours. CBG:  Recent Labs Lab 06/05/16 1640 06/05/16 1951 06/06/16 0007 06/06/16 0405 06/06/16 0724  GLUCAP 101* 108* 124* 101* 131*   Lipid Profile: No results for input(s): CHOL, HDL, LDLCALC, TRIG, CHOLHDL, LDLDIRECT in the last 72 hours. Thyroid Function Tests: No results for input(s): TSH, T4TOTAL, FREET4, T3FREE, THYROIDAB in the last 72 hours. Anemia Panel: No results for input(s): VITAMINB12, FOLATE, FERRITIN, TIBC, IRON, RETICCTPCT in the last 72 hours. Urine analysis:    Component Value Date/Time   COLORURINE YELLOW 06/03/2016 2240   APPEARANCEUR CLOUDY (A) 06/03/2016 2240   LABSPEC 1.020 06/03/2016 2240   PHURINE 6.0 06/03/2016 2240   GLUCOSEU NEGATIVE 06/03/2016 2240   HGBUR NEGATIVE 06/03/2016 2240   BILIRUBINUR NEGATIVE 06/03/2016 2240   KETONESUR NEGATIVE 06/03/2016 2240   PROTEINUR NEGATIVE 06/03/2016 2240   UROBILINOGEN 1.0 11/02/2014 0924   NITRITE NEGATIVE 06/03/2016 2240   LEUKOCYTESUR NEGATIVE 06/03/2016 2240     ) Recent Results (from the past 240 hour(s))  Blood Culture (routine x 2)     Status: None (Preliminary result)   Collection Time: 06/03/16  9:40 PM  Result Value Ref Range Status   Specimen Description BLOOD LEFT WRIST  Final   Special Requests IN PEDIATRIC BOTTLE [place]  Final   Culture   Final    NO GROWTH 1 DAY Performed at [facility]    Report Status PENDING  Incomplete  Blood Culture (routine x 2)     Status: None (Preliminary result)   Collection Time: 06/03/16  9:45 PM  Result Value Ref Range Status   Specimen Description BLOOD LEFT FOREARM  Final   Special Requests IN PEDIATRIC BOTTLE [place]  Final   Culture   Final     NO GROWTH 1 DAY Performed at [facility]    Report Status PENDING  Incomplete  Urine culture     Status: Abnormal (Preliminary result)   Collection Time: 06/03/16 10:40 PM  Result Value Ref Range Status   Specimen Description URINE, RANDOM  Final   Special Requests NONE  Final   Culture (A)  Final    >=100,000 COLONIES/mL STAPHYLOCOCCUS SPECIES (COAGULASE NEGATIVE)   Report Status PENDING  Incomplete  MRSA PCR Screening     Status: None   Collection Time: 06/04/16 12:55 AM  Result Value Ref Range Status   MRSA by  PCR NEGATIVE NEGATIVE Final    Comment:        The GeneXpert MRSA Assay (FDA approved for NASAL specimens only), is one component of a comprehensive MRSA colonization surveillance program. It is not intended to diagnose MRSA infection nor to guide or monitor treatment for MRSA infections.       Anti-infectives    Start     Dose/Rate Route Frequency Ordered Stop   06/06/16 1200  amoxicillin-clavulanate (AUGMENTIN) 875-125 MG per tablet 1 tablet  Status:  Discontinued     1 tablet Oral Every 12 hours 06/06/16 1050 06/06/16 1052   06/06/16 1200  amoxicillin-clavulanate (AUGMENTIN) 400-57 MG/5ML suspension 875 mg     875 mg Per Tube Every 12 hours 06/06/16 1051     06/04/16 1000  vancomycin (VANCOCIN) 500 mg in sodium chloride 0.9 % 100 mL IVPB  Status:  Discontinued     500 mg 100 mL/hr over 60 Minutes Intravenous Every 12 hours 06/04/16 0553 06/06/16 1050   06/04/16 0100  ceFEPIme (MAXIPIME) 1 g in dextrose 5 % 50 mL IVPB  Status:  Discontinued     1 g 100 mL/hr over 30 Minutes Intravenous Every 8 hours 06/04/16 0055 06/06/16 1050   06/03/16 2230  piperacillin-tazobactam (ZOSYN) IVPB 3.375 g     3.375 g 100 mL/hr over 30 Minutes Intravenous  Once 06/03/16 2226 06/03/16 2300   06/03/16 2230  vancomycin (VANCOCIN) IVPB 1000 mg/200 mL premix     1,000 mg 200 mL/hr over 60 Minutes Intravenous  Once 06/03/16 2226 06/03/16 2359       Radiology Studies: No  results found.      Scheduled Meds: . amoxicillin-clavulanate  875 mg Per Tube Q12H  . antiseptic oral rinse  7 mL Mouth Rinse q12n4p  . chlorhexidine  15 mL Mouth Rinse BID  . clonazePAM  1 mg Per Tube Q8H  . enoxaparin (LOVENOX) injection  40 mg Subcutaneous Q24H  . free water  100 mL Per Tube Q4H  . [START ON 06/07/2016] levETIRAcetam  500 mg Per Tube Daily  . levothyroxine  100 mcg Per Tube QAC breakfast  . metoprolol tartrate  12.5 mg Oral TID  . Valproate Sodium  750 mg Per Tube Q8H   Continuous Infusions: . TWOCAL HN 237 mL (06/06/16 1015)     LOS: 3 days    Time spent: 25 min    [place], DO [facility] Pager [phone]  If 7PM-7AM, please contact night-coverage [email or web address] Password [email or web address] 06/06/2016, 1:16 PM

## 2016-06-07 LAB — BASIC METABOLIC PANEL
ANION GAP: 6 (ref 5–15)
BUN: 18 mg/dL (ref 4–21)
BUN: 18 mg/dL (ref 6–20)
CALCIUM: 8.5 mg/dL — AB (ref 8.9–10.3)
CO2: 26 mmol/L (ref 22–32)
Chloride: 108 mmol/L (ref 101–111)
Creatinine, Ser: 0.45 mg/dL (ref 0.44–1.00)
Creatinine: 0.5 mg/dL (ref 0.5–1.1)
GFR calc Af Amer: >60 mL/min (ref >60)
GLUCOSE: 118 mg/dL
GLUCOSE: 118 mg/dL — AB (ref 65–99)
Potassium: 3.6 mmol/L (ref 3.5–5.1)
SODIUM: 140 mmol/L (ref 137–147)
Sodium: 140 mmol/L (ref 135–145)

## 2016-06-07 LAB — GLUCOSE, CAPILLARY
GLUCOSE-CAPILLARY: 112 mg/dL — AB (ref 65–99)
GLUCOSE-CAPILLARY: 139 mg/dL — AB (ref 65–99)
GLUCOSE-CAPILLARY: 156 mg/dL — AB (ref 65–99)
GLUCOSE-CAPILLARY: 179 mg/dL — AB (ref 65–99)
Glucose-Capillary: 115 mg/dL — ABNORMAL HIGH (ref 65–99)

## 2016-06-07 LAB — URINE CULTURE

## 2016-06-07 LAB — CBC
HEMATOCRIT: 32.1 % — AB (ref 36.0–46.0)
Hemoglobin: 10.9 g/dL — ABNORMAL LOW (ref 12.0–15.0)
MCH: 30.4 pg (ref 26.0–34.0)
MCHC: 34 g/dL (ref 30.0–36.0)
MCV: 89.4 fL (ref 78.0–100.0)
PLATELETS: 181 10*3/uL (ref 150–400)
RBC: 3.59 MIL/uL — ABNORMAL LOW (ref 3.87–5.11)
RDW: 14.9 % (ref 11.5–15.5)
WBC: 16.8 10*3/uL — AB (ref 4.0–10.5)

## 2016-06-07 LAB — CBC AND DIFFERENTIAL: WBC: 16.8 10*3/mL

## 2016-06-07 MED ORDER — ACETAMINOPHEN 650 MG RE SUPP: 650.0000 mg | Freq: Four times a day (QID) | RECTAL | 0 refills | Status: AC | PRN

## 2016-06-07 MED ORDER — AMOXICILLIN-POT CLAVULANATE 400-57 MG/5ML PO SUSR: 875.0000 mg | Freq: Two times a day (BID) | ORAL | 0 refills | Status: DC

## 2016-06-07 MED ORDER — IPRATROPIUM-ALBUTEROL 0.5-2.5 (3) MG/3ML IN SOLN: 3.0000 mL | Freq: Four times a day (QID) | RESPIRATORY_TRACT | Status: DC | PRN

## 2016-06-07 MED ORDER — FREE WATER: 100.0000 mL | Status: AC

## 2016-06-07 NOTE — Care Management Important Message (Signed)
Important Message  Patient Details  Name: Dawn [name] MRN: [id number] Date of Birth: November 12, 1945   Medicare Important Message Given:  Yes    Camillo Flaming 06/07/2016, 9:11 AMImportant Message  Patient Details  Name: Dawn [name] MRN: [id number] Date of Birth: 1946-03-20   Medicare Important Message Given:  Yes    Camillo Flaming 06/07/2016, 9:11 AM

## 2016-06-07 NOTE — Progress Notes (Signed)
Report called to [facility]. Given to Nurse. Pt will be transported back to facility via [facility].

## 2016-06-07 NOTE — Progress Notes (Signed)
Patient is set to discharge back to [facility] today. Patient & sister, Bertram Millard made aware. Discharge packet given to RN, Catie. [facility] called for transport.     Raynaldo Opitz, [facility] Clinical Social Worker cell #: [phone]

## 2016-06-07 NOTE — Care Management Note (Signed)
Case Management Note  Patient Details  Name: Dawn [name] MRN: [id number] Date of Birth: Jan 26, 1946  Subjective/Objective:                    Action/Plan:d/c SNF.   Expected Discharge Date:                  Expected Discharge Plan:  Skilled Nursing Facility  In-House Referral:  Clinical Social Work  Discharge planning Services  CM Consult  Post Acute Care Choice:    Choice offered to:     DME Arranged:    DME Agency:     HH Arranged:    [place] Agency:     Status of Service:  Completed, signed off  If discussed at [employer], dates discussed:    Additional Comments:  Dessa Phi, RN 06/07/2016, 1:23 PM

## 2016-06-07 NOTE — Discharge Summary (Signed)
Physician Discharge Summary  Dawn [name] [id number] DOB: 1946-08-16 DOA: 06/03/2016  PCP: Hennie Duos, MD  Admit date: 06/03/2016 Discharge date: 06/07/2016   Recommendations for Outpatient Follow-Up:   Aspiration precautions: keep head of bed elevated Cbc, bmp 1 week augmentin through 8/28 Bowel regimen if not BM for 2 days DNR-- continue palliative care talks with POA   Discharge Diagnosis:   Principal Problem:   Sepsis ([name]) Active Problems:   Hypothyroidism   Paroxysmal a-fib ([name])   PEG (percutaneous endoscopic gastrostomy) status ([name])   HCAP (healthcare-associated pneumonia)   Severe dementia   Seizures ([name])   Discharge disposition:   SNF:  Discharge Condition: Improved.  Diet recommendation: tube feedings  Wound care: None.   History of Present Illness:   Dawn [name] is a 70 y.o. female with mental retardation and dementia, seizure disorder, hypothyroidism and paroxysmal atrial fibrillation was brought to the ER after patient had nausea vomiting followed by fever and chills. In the ER patient was found to be tachycardic with lactic acid around 8 and blood work showed leukocytosis. Patient was given fluid bolus for sepsis protocol. Chest x-ray shows infiltrates which possibly could be the source of infection. Patient is noncommunicative and does not provide history. Most of the history was obtained from the ER physician and ER nurse. On exam patient is not in distress and has productive cough and PEG tube is seen in place.    Hospital Course by Problem:   Sepsis  -?aspiration pneumonia -- change to PO augmentin -restarted tube feeds-tolerated well  History of seizures-  - IV Keppra and Depakote- changed to PO through tube  Hypothyroidism- resume home dose  Paroxysmal atrial fibrillation -resume home PEG tube dose of BB  Urine with gram + cocci -suspect contaminant, will not treat                        Hypokalemia                     -repleted    Medical Consultants:    None.   Discharge Exam:   Vitals:   06/06/16 2016 06/07/16 0429  BP: (!) 158/77 (!) 126/59  Pulse: 98 99  Resp: (!) 22 20  Temp: 98.2 F (36.8 C) 98.9 F (37.2 C)   Vitals:   06/06/16 0446 06/06/16 1419 06/06/16 2016 06/07/16 0429  BP: (!) 144/70 105/84 (!) 158/77 (!) 126/59  Pulse: (!) 107 (!) 103 98 99  Resp: (!) 30 (!) 21 (!) 22 20  Temp: 97.8 F (36.6 C) 98.4 F (36.9 C) 98.2 F (36.8 C) 98.9 F (37.2 C)  TempSrc: Axillary Axillary Axillary Oral  SpO2: 96% 100% 95% 96%  Weight: 68.1 kg (150 lb 2.1 oz)   69.7 kg (153 lb 10.6 oz)  Height:        Gen:  NAD- at baseline- opens eyes   The results of significant diagnostics from this hospitalization (including imaging, microbiology, ancillary and laboratory) are listed below for reference.     Procedures and Diagnostic Studies:   Dg Chest 1 View  Result Date: 06/03/2016 CLINICAL DATA:  Vomiting.  Concern for aspiration. EXAM: CHEST 1 VIEW COMPARISON:  Chest radiograph 02/08/2016 FINDINGS: Examination is limited by patient positioning. Patient is markedly rotated to the right. There is shallow lung inflation. Cardiomediastinal silhouette is largely obscured by what is favored to represent extra thoracic soft tissue. This supine radiograph shows no large  pleural effusion or pneumothorax. Hazy opacities at the left lung base are similar to the prior examination. No lobar consolidation. IMPRESSION: 1. Examination limited by patient positioning. 2. Persistent shallow lung inflation and hazy opacities at the left lung base. No other focal airspace disease. Electronically Signed   By: Ulyses Jarred M.D.   On: 06/03/2016 22:43   Dg Abd 1 View  Result Date: 06/04/2016 CLINICAL DATA:  Nausea and vomiting.  Cerebral palsy. EXAM: ABDOMEN - 1 VIEW COMPARISON:  Abdominal radiograph 05/12/2016 FINDINGS: A gastrostomy tube is again seen overlying the stomach. There are  cholecystectomy clips in the right lower quadrant. Bilateral lower quadrant surgical clips are again noted. No dilated small bowel is identified. There is stool seen within the rectum. IMPRESSION: No radiographic evidence of small-bowel obstruction. Electronically Signed   By: Ulyses Jarred M.D.   On: 06/04/2016 05:46     Labs:   Basic Metabolic Panel:  Recent Labs Lab 06/03/16 2204 06/04/16 0657 06/05/16 0540 06/06/16 0523 06/07/16 1035  NA 137 136 138 138 140  K 4.6 4.2 4.0 3.2* 3.6  CL 100* 107 109 107 108  CO2 23 23 23 24 26   GLUCOSE 120* 117* 80 155* 118*  BUN 22* 17 15 20 18   CREATININE 0.67 0.57 0.46 0.46 0.45  CALCIUM 9.2 8.4* 8.6* 8.6* 8.5*   GFR Estimated Creatinine Clearance: 60.7 mL/min (by C-G formula based on SCr of 0.8 mg/dL). Liver Function Tests:  Recent Labs Lab 06/04/16 0657  AST 30  ALT 18  ALKPHOS 91  BILITOT 0.5  PROT 7.4  ALBUMIN 2.5*   No results for input(s): LIPASE, AMYLASE in the last 168 hours. No results for input(s): AMMONIA in the last 168 hours. Coagulation profile No results for input(s): INR, PROTIME in the last 168 hours.  CBC:  Recent Labs Lab 06/03/16 2204 06/04/16 0657 06/05/16 0540 06/06/16 0523 06/07/16 0848  WBC 35.0* 42.4* 31.9* 25.8* 16.8*  NEUTROABS 31.8* 37.4*  --   --   --   HGB 14.8 12.9 12.7 11.8* 10.9*  HCT 45.5 39.5 38.7 35.2* 32.1*  MCV 94.0 93.6 92.4 91.0 89.4  PLT 297 275 252 205 181   Cardiac Enzymes: No results for input(s): CKTOTAL, CKMB, CKMBINDEX, TROPONINI in the last 168 hours. BNP: Invalid input(s): POCBNP CBG:  Recent Labs Lab 06/06/16 2015 06/07/16 0002 06/07/16 0426 06/07/16 0726 06/07/16 1148  GLUCAP 104* 139* 179* 156* 112*   D-Dimer No results for input(s): DDIMER in the last 72 hours. Hgb A1c No results for input(s): HGBA1C in the last 72 hours. Lipid Profile No results for input(s): CHOL, HDL, LDLCALC, TRIG, CHOLHDL, LDLDIRECT in the last 72 hours. Thyroid function  studies No results for input(s): TSH, T4TOTAL, T3FREE, THYROIDAB in the last 72 hours.  Invalid input(s): FREET3 Anemia work up No results for input(s): VITAMINB12, FOLATE, FERRITIN, TIBC, IRON, RETICCTPCT in the last 72 hours. Microbiology Recent Results (from the past 240 hour(s))  Blood Culture (routine x 2)     Status: None (Preliminary result)   Collection Time: 06/03/16  9:40 PM  Result Value Ref Range Status   Specimen Description BLOOD LEFT WRIST  Final   Special Requests IN PEDIATRIC BOTTLE [place]  Final   Culture   Final    NO GROWTH 2 DAYS Performed at [facility]    Report Status PENDING  Incomplete  Blood Culture (routine x 2)     Status: None (Preliminary result)   Collection Time: 06/03/16  9:45  PM  Result Value Ref Range Status   Specimen Description BLOOD LEFT FOREARM  Final   Special Requests IN PEDIATRIC BOTTLE 1CC  Final   Culture   Final    NO GROWTH 2 DAYS Performed at [facility]    Report Status PENDING  Incomplete  Urine culture     Status: Abnormal   Collection Time: 06/03/16 10:40 PM  Result Value Ref Range Status   Specimen Description URINE, RANDOM  Final   Special Requests NONE  Final   Culture (A)  Final    >=100,000 COLONIES/mL STAPHYLOCOCCUS SPECIES (COAGULASE NEGATIVE)   Report Status 06/07/2016 FINAL  Final   Organism ID, Bacteria STAPHYLOCOCCUS SPECIES (COAGULASE NEGATIVE) (A)  Final      Susceptibility   Staphylococcus species (coagulase negative) - MIC*    CIPROFLOXACIN <=0.5 SENSITIVE Sensitive     GENTAMICIN <=0.5 SENSITIVE Sensitive     NITROFURANTOIN <=16 SENSITIVE Sensitive     OXACILLIN <=0.25 SENSITIVE Sensitive     TETRACYCLINE 2 SENSITIVE Sensitive     VANCOMYCIN <=0.5 SENSITIVE Sensitive     TRIMETH/SULFA <=10 SENSITIVE Sensitive     CLINDAMYCIN <=0.25 SENSITIVE Sensitive     RIFAMPIN <=0.5 SENSITIVE Sensitive     * >=100,000 COLONIES/mL STAPHYLOCOCCUS SPECIES (COAGULASE NEGATIVE)  MRSA PCR Screening      Status: None   Collection Time: 06/04/16 12:55 AM  Result Value Ref Range Status   MRSA by PCR NEGATIVE NEGATIVE Final    Comment:        The GeneXpert MRSA Assay (FDA approved for NASAL specimens only), is one component of a comprehensive MRSA colonization surveillance program. It is not intended to diagnose MRSA infection nor to guide or monitor treatment for MRSA infections.      Discharge Instructions:   Discharge Instructions    Discharge instructions    Complete by:  As directed   Aspiration precautions: keep head of bed elevated Cbc, bmp 1 week augmentin through 8/28 Bowel regimen if not BM for 2 days DNR-- continue palliative care talks with POA   Increase activity slowly    Complete by:  As directed       Medication List    TAKE these medications   acetaminophen 650 MG suppository Commonly known as:  TYLENOL Place 1 suppository (650 mg total) rectally every 6 (six) hours as needed for mild pain (or Fever >/= 101).   amoxicillin-clavulanate 400-57 MG/5ML suspension Commonly known as:  AUGMENTIN Place 10.9 mLs (875 mg total) into feeding tube every 12 (twelve) hours.   CENTAMIN Liqd Place 5 mLs into feeding tube daily.   clonazePAM 1 MG tablet Commonly known as:  KLONOPIN Place 1 mg into feeding tube every 8 (eight) hours.   docusate 50 MG/5ML liquid Commonly known as:  COLACE Place 20 mLs (200 mg total) into feeding tube 2 (two) times daily.   famotidine 20 MG tablet Commonly known as:  PEPCID Give 1 tablet via peg tube daily ( 20 mg )   feeding supplement (PRO-STAT SUGAR FREE 64) Liqd Place 30 mLs into feeding tube daily.   free water Soln Place 100 mLs into feeding tube every 4 (four) hours.   furosemide 20 MG tablet Commonly known as:  LASIX 20 mg by PEG Tube route 2 (two) times daily.   ipratropium-albuterol 0.5-2.5 (3) MG/3ML Soln Commonly known as:  DUONEB Take 3 mLs by nebulization every 6 (six) hours as needed.   levETIRAcetam 100  MG/ML solution Commonly known as:  KEPPRA Place 5 mLs (500 mg total) into feeding tube daily at 6 (six) AM.   levothyroxine 100 MCG tablet Commonly known as:  SYNTHROID, LEVOTHROID 100 mcg by PEG Tube route daily before breakfast.   metoprolol tartrate 25 MG tablet Commonly known as:  LOPRESSOR 12.5 mg by PEG Tube route 3 (three) times daily.   NUTRITIONAL SUPPLEMENT PO Two cal hn via peg at 39 cc/hr x 24 hours continuously   potassium chloride 20 MEQ/15ML (10%) Soln 20 mEq by PEG Tube route daily. Dilute with water before administered.   Valproic Acid 250 MG/5ML Syrp syrup Commonly known as:  DEPAKENE Place 15 mLs (750 mg total) into feeding tube 3 (three) times daily.      Follow-up Information    Hennie Duos, MD Follow up in 1 week(s).   Specialty:  Internal Medicine Contact information: [place] [address] [phone]            Time coordinating discharge: 35 min  Signed:  Tajuana Kniskern U Raunel Dimartino   [facility] 06/07/2016, 12:29 PM

## 2016-06-08 ENCOUNTER — Non-Acute Institutional Stay (SKILLED_NURSING_FACILITY): Payer: Medicare Other | Admitting: Internal Medicine

## 2016-06-08 ENCOUNTER — Encounter: Payer: Self-pay | Admitting: Internal Medicine

## 2016-06-08 DIAGNOSIS — R403 Persistent vegetative state: Secondary | ICD-10-CM

## 2016-06-08 DIAGNOSIS — I480 Paroxysmal atrial fibrillation: Secondary | ICD-10-CM | POA: Diagnosis not present

## 2016-06-08 DIAGNOSIS — E038 Other specified hypothyroidism: Secondary | ICD-10-CM | POA: Diagnosis not present

## 2016-06-08 DIAGNOSIS — F03C0 Unspecified dementia, severe, without behavioral disturbance, psychotic disturbance, mood disturbance, and anxiety: Secondary | ICD-10-CM

## 2016-06-08 DIAGNOSIS — J189 Pneumonia, unspecified organism: Secondary | ICD-10-CM | POA: Diagnosis not present

## 2016-06-08 DIAGNOSIS — F0390 Unspecified dementia without behavioral disturbance: Secondary | ICD-10-CM | POA: Diagnosis not present

## 2016-06-08 DIAGNOSIS — G40909 Epilepsy, unspecified, not intractable, without status epilepticus: Secondary | ICD-10-CM | POA: Diagnosis not present

## 2016-06-08 DIAGNOSIS — A419 Sepsis, unspecified organism: Secondary | ICD-10-CM

## 2016-06-08 DIAGNOSIS — E034 Atrophy of thyroid (acquired): Secondary | ICD-10-CM

## 2016-06-08 DIAGNOSIS — F72 Severe intellectual disabilities: Secondary | ICD-10-CM

## 2016-06-08 NOTE — Progress Notes (Signed)
MRN: [id number] Name: Dawn [name]  Sex: female Age: 70 y.o. DOB: May 05, 1946  [place] #:  Facility/Room: [name] [place]: SNF Provider: Noah Delaine. Sheppard Coil, MD Emergency Contacts: Extended Emergency Contact Information Primary Emergency Contact: Sellars,[name] Address: [place]          [place] [address] [place] Phone: [phone] Relation: Sister Secondary Emergency Contact: Malika Cower [place] Phone: [phone] Relation: None  Code Status: DNR  Allergies: Carvedilol and Ppd [tuberculin purified protein derivative]  Chief Complaint  Patient presents with  . Readmit To SNF    Admit to Facility    HPI: Patient is 70 y.o. female with MR and dementia, seizure disorder, hypothyroidism and paroxysmal atrial fibrillation was brought to the ER after patient had nausea vomiting at SNF followed by fever and chills. In the ER patient was found to be tachycardic with lactic acid around 8 and blood work showed leukocytosis. Pt was admitted to [facility] from 8/20-24 where she was treated for sepsis, felt 2/2 aspiration PNA. Pt is admitted back to SNF for residential care. While at SNF pt will be followed for sizures, tx with keppra and depakote, PAF, tx with metoprolol, and hypothyroidism, tx with synthroid.  Past Medical History:  Diagnosis Date  . Anemia   . Anginal pain ([place])   . Cataract   . Cataracts, bilateral   . CHF (congestive heart failure) ([place])   . Coronary artery disease   . Dementia   . Dental caries   . Dysphagia   . Esophageal reflux   . High cholesterol   . Hyperlipidemia   . Hypotension   . Hypothyroidism   . Optic atrophy   . Optic atrophy   . Osteoporosis   . 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 Paroxysmal atrial fibrillation (HCC)   . Seizures ([place])   . Sepsis ([place]) 01/2016  . Thyroid disease    hypothyroid  . Unspecified convulsions ([place])     Past Surgical History:  Procedure Laterality  Date  . CENTRAL VENOUS CATHETER INSERTION  05/15/2014      . ESOPHAGOGASTRODUODENOSCOPY N/A 06/03/2014   Procedure: ESOPHAGOGASTRODUODENOSCOPY (EGD);  Surgeon: Beryle Beams, MD;  Location: [facility] ENDOSCOPY;  Service: Endoscopy;  Laterality: N/A;  . GASTROSTOMY TUBE PLACEMENT    . midline incision        Medication List       Accurate as of 06/08/16 11:59 PM. Always use your most recent med list.          acetaminophen 650 MG suppository Commonly known as:  TYLENOL Place 1 suppository (650 mg total) rectally every 6 (six) hours as needed for mild pain (or Fever >/= 101).   amoxicillin-clavulanate 400-57 MG/5ML suspension Commonly known as:  AUGMENTIN Place 10.9 mLs (875 mg total) into feeding tube every 12 (twelve) hours.   CENTAMIN Liqd Place 5 mLs into feeding tube daily.   clonazePAM 1 MG tablet Commonly known as:  KLONOPIN Place 1 mg into feeding tube every 8 (eight) hours.   docusate 50 MG/5ML liquid Commonly known as:  COLACE Place 20 mLs (200 mg total) into feeding tube 2 (two) times daily.   famotidine 20 MG tablet Commonly known as:  PEPCID Give 1 tablet via peg tube daily ( 20 mg )   feeding supplement (PRO-STAT SUGAR FREE 64) Liqd Place 30 mLs into feeding tube daily.   free water Soln Place 100 mLs into feeding  tube every 4 (four) hours.   furosemide 20 MG tablet Commonly known as:  LASIX 20 mg by PEG Tube route 2 (two) times daily.   ipratropium-albuterol 0.5-2.5 (3) MG/3ML Soln Commonly known as:  DUONEB Take 3 mLs by nebulization every 6 (six) hours as needed.   levETIRAcetam 100 MG/ML solution Commonly known as:  KEPPRA Place 5 mLs (500 mg total) into feeding tube daily at 6 (six) AM.   levothyroxine 100 MCG tablet Commonly known as:  SYNTHROID, LEVOTHROID 100 mcg by PEG Tube route daily before breakfast.   metoprolol tartrate 25 MG tablet Commonly known as:  LOPRESSOR 12.5 mg by PEG Tube route 3 (three) times daily.   NUTRITIONAL SUPPLEMENT  PO Two cal hn via peg at 39 cc/hr x 24 hours continuously   potassium chloride 20 MEQ/15ML (10%) Soln 20 mEq by PEG Tube route daily. Dilute with water before administered.   Valproic Acid 250 MG/5ML Syrp syrup Commonly known as:  DEPAKENE Place 15 mLs (750 mg total) into feeding tube 3 (three) times daily.       No orders of the defined types were placed in this encounter.   Immunization History  Administered Date(s) Administered  . Influenza-Unspecified 08/13/2014, 07/11/2015    Social History  Substance Use Topics  . Smoking status: Never Smoker  . Smokeless tobacco: Never Used  . Alcohol use No    Family history is   Family History  Problem Relation Age of Onset  . Colon cancer Mother   . Diabetes type II Sister       Review of Systems  UTO 2/2 pt's MR/dementia   Vitals:   06/08/16 1037  BP: 131/81  Pulse: 92  Resp: (!) 21  Temp: 97.6 F (36.4 C)    SpO2 Readings from Last 1 Encounters:  06/07/16 98%        Physical Exam  GENERAL APPEARANCE: eyes open, non conversant,  No acute distress.  SKIN: No diaphoresis rash HEAD: Normocephalic, atraumatic  EYES: Conjunctiva/lids clear. Pupils round, reactive. EOMs intact.  EARS: External exam WNL, canals clear. Hearing grossly normal.  NOSE: No deformity or discharge.  MOUTH/THROAT: Lips w/o lesions  RESPIRATORY: Breathing is even, unlabored. Lung sounds are clear   CARDIOVASCULAR: Heart RRR no murmurs, rubs or gallops. No peripheral edema.   GASTROINTESTINAL: Abdomen is soft, non-tender, not distended w/ normal bowel sounds; PEG tube GENITOURINARY: Bladder non tender, not distended  MUSCULOSKELETAL: wasting and contractures all extremities NEUROLOGIC: vegetative, unpurposeful BUE movements at time  PSYCHIATRIC: n/a  Patient Active Problem List   Diagnosis Date Noted  . Seizures ([place]) 02/15/2016  . PNA (pneumonia) 02/08/2016  . Severe mental retardation 01/10/2015  . Severe dementia 01/10/2015   . Senile debility 01/10/2015  . 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 Acute respiratory failure with hypoxia ([place]) 11/20/2014  . Generalized weakness 11/20/2014  . Dysphagia 11/20/2014  . Dementia without behavioral disturbance 11/20/2014  . Development delay 11/02/2014  . Acute respiratory distress (HCC) 11/02/2014  . Paroxysmal ventricular tachycardia ([place]) 10/30/2014  . HCAP (healthcare-associated pneumonia) 10/26/2014  . Hematoma of right parietal scalp 08/13/2014  . Edema 08/09/2014  . Hematoma 08/05/2014  . Fall at nursing home 08/05/2014  . Leukocytosis 07/26/2014  . Tachycardia 07/11/2014  . Leukocytopenia, unspecified 07/11/2014  . Pneumonia 06/17/2014  . Sepsis ([place]) 06/12/2014  . Facial droop 06/12/2014  . Hypernatremia 06/12/2014  . PEG (percutaneous endoscopic gastrostomy) status ([place]) 06/12/2014  . FUO (fever of unknown origin) 06/02/2014  . Paroxysmal  a-fib ([place]) 05/14/2014  . Dysphasia 05/14/2014  . UTI (lower urinary tract infection) 05/14/2014  . Tooth decay 05/14/2014  . SIRS (systemic inflammatory response syndrome) ([place]) 12/15/2013  . Thrombocytopenia ([place]) 10/29/2013  . Hypotension 10/29/2013  . Anemia of chronic disease 10/29/2013  . Diastolic heart failure, NYHA class 2 ([place]) 10/29/2013  . Atrial fibrillation with rapid ventricular response ([place]) 10/28/2013  . Seizure disorder ([place]) 10/28/2013  . Hypothyroidism 10/28/2013  . HLD (hyperlipidemia) 10/28/2013  . Non-ST elevation myocardial infarction (NSTEMI) ([place]) 10/28/2013       Component Value Date/Time   WBC 16.8 (H) 06/07/2016 0848   RBC 3.59 (L) 06/07/2016 0848   HGB 10.9 (L) 06/07/2016 0848   HCT 32.1 (L) 06/07/2016 0848   HCT 25.4 (L) 11/03/2014 0725   PLT 181 06/07/2016 0848   MCV 89.4 06/07/2016 0848   LYMPHSABS 2.5 06/04/2016 0657   MONOABS 2.5 (H) 06/04/2016 0657   EOSABS 0.0 06/04/2016 0657   BASOSABS 0.0 06/04/2016 0657        Component Value Date/Time   NA 140  06/07/2016 1035   NA 140 06/07/2016   K 3.6 06/07/2016 1035   CL 108 06/07/2016 1035   CO2 26 06/07/2016 1035   GLUCOSE 118 (H) 06/07/2016 1035   BUN 18 06/07/2016 1035   BUN 18 06/07/2016   CREATININE 0.45 06/07/2016 1035   CALCIUM 8.5 (L) 06/07/2016 1035   PROT 7.4 06/04/2016 0657   ALBUMIN 2.5 (L) 06/04/2016 0657   AST 30 06/04/2016 0657   ALT 18 06/04/2016 0657   ALKPHOS 91 06/04/2016 0657   BILITOT 0.5 06/04/2016 0657   GFRNONAA >60 06/07/2016 1035   GFRAA >60 06/07/2016 1035    No results found for: HGBA1C  Lab Results  Component Value Date   CHOL 117 07/27/2014   HDL 22 (A) 07/27/2014   LDLCALC 72 07/27/2014   TRIG 116 07/27/2014     Dg Chest 1 View  Result Date: 06/03/2016 CLINICAL DATA:  Vomiting.  Concern for aspiration. EXAM: CHEST 1 VIEW COMPARISON:  Chest radiograph 02/08/2016 FINDINGS: Examination is limited by patient positioning. Patient is markedly rotated to the right. There is shallow lung inflation. Cardiomediastinal silhouette is largely obscured by what is favored to represent extra thoracic soft tissue. This supine radiograph shows no large pleural effusion or pneumothorax. Hazy opacities at the left lung base are similar to the prior examination. No lobar consolidation. IMPRESSION: 1. Examination limited by patient positioning. 2. Persistent shallow lung inflation and hazy opacities at the left lung base. No other focal airspace disease. Electronically Signed   By: Ulyses Jarred M.D.   On: 06/03/2016 22:43   Dg Abd 1 View  Result Date: 06/04/2016 CLINICAL DATA:  Nausea and vomiting.  Cerebral palsy. EXAM: ABDOMEN - 1 VIEW COMPARISON:  Abdominal radiograph 05/12/2016 FINDINGS: A gastrostomy tube is again seen overlying the stomach. There are cholecystectomy clips in the right lower quadrant. Bilateral lower quadrant surgical clips are again noted. No dilated small bowel is identified. There is stool seen within the rectum. IMPRESSION: No radiographic  evidence of small-bowel obstruction. Electronically Signed   By: Ulyses Jarred M.D.   On: 06/04/2016 05:46    Not all labs, radiology exams or other studies done during hospitalization come through on my EPIC note; however they are reviewed by me.    Assessment and Plan  SEPSIS/ASPIRATION PNA- tx initially with vanc/zosyn, then vanc and cefepime;pt was transitioned to augmentin 874 mg q12 per tube started on 8/23  SNF - cont augmentin per tube thru 8/28  SEIZURES- no reported seizures SNF - was on IV, now back on po keppra 500 mg PT daily and depakene 730 mg PT TID  HYPOTHYROIDISM SNF - cont synthroid per tube 100 mcg daily  AF- controlled SNF - cont metoprolol 12.5 mg BID PT  URINE WITH GRAM+ COCCI-felt to be a contaminant, not treated  VEGETATIVE STATE/DEMENTIA/MENTAL RETARDATION- unchanging/unchangable    Time spent > 45 min;> 50% of time with patient was spent reviewing records, labs, tests and studies, counseling and developing plan of care  Webb Silversmith D. Sheppard Coil, MD

## 2016-06-09 ENCOUNTER — Encounter: Payer: Self-pay | Admitting: Internal Medicine

## 2016-06-09 LAB — CULTURE, BLOOD (ROUTINE X 2)
CULTURE: NO GROWTH
CULTURE: NO GROWTH

## 2016-06-12 LAB — CBC AND DIFFERENTIAL
HCT: 37 % (ref 36–46)
HEMOGLOBIN: 12.1 g/dL (ref 12.0–16.0)
Platelets: 399 10*3/uL (ref 150–399)
WBC: 16.9 10*3/mL

## 2016-06-12 LAB — BASIC METABOLIC PANEL
BUN: 11 mg/dL (ref 4–21)
CREATININE: 0.4 mg/dL — AB (ref 0.5–1.1)
GLUCOSE: 93 mg/dL
POTASSIUM: 4.7 mmol/L (ref 3.4–5.3)
SODIUM: 136 mmol/L — AB (ref 137–147)

## 2016-06-13 LAB — BASIC METABOLIC PANEL
BUN: 11 mg/dL (ref 4–21)
BUN: 11 mg/dL (ref 4–21)
CREATININE: 0.4 mg/dL — AB (ref 0.5–1.1)
Creatinine: 0.4 mg/dL — AB (ref 0.5–1.1)
GLUCOSE: 93 mg/dL
Glucose: 93 mg/dL
POTASSIUM: 4.7 mmol/L (ref 3.4–5.3)
POTASSIUM: 4.7 mmol/L (ref 3.4–5.3)
SODIUM: 136 mmol/L — AB (ref 137–147)
Sodium: 136 mmol/L — AB (ref 137–147)

## 2016-06-13 LAB — CBC AND DIFFERENTIAL
HCT: 37 % (ref 36–46)
HEMATOCRIT: 37 % (ref 36–46)
HEMOGLOBIN: 12.1 g/dL (ref 12.0–16.0)
Hemoglobin: 12.1 g/dL (ref 12.0–16.0)
PLATELETS: 399 10*3/uL (ref 150–399)
PLATELETS: 399 10*3/uL (ref 150–399)
WBC: 16.9 10*3/mL
WBC: 16.9 10^3/mL

## 2016-06-14 ENCOUNTER — Non-Acute Institutional Stay (SKILLED_NURSING_FACILITY): Payer: Medicare Other | Admitting: Internal Medicine

## 2016-06-14 DIAGNOSIS — D72829 Elevated white blood cell count, unspecified: Secondary | ICD-10-CM | POA: Diagnosis not present

## 2016-06-15 ENCOUNTER — Encounter: Payer: Self-pay | Admitting: Internal Medicine

## 2016-06-15 NOTE — Progress Notes (Signed)
MRN: [id number] Name: Dawn [name]  Sex: female Age: 70 y.o. DOB: 03-29-1946  [place] #:  Facility/Room: [name]: SNF Provider: Noah Delaine. Sheppard Coil, MD Emergency Contacts: Extended Emergency Contact Information Primary Emergency Contact: Sellars,[name] Address: [address]          [place] [address] [place] Phone: [phone] Relation: Sister Secondary Emergency Contact: Jocabed Cower [place] Phone: [phone] Relation: None  Code Status: DNR  Allergies: Carvedilol and Ppd [tuberculin purified protein derivative]  Chief Complaint  Patient presents with  . Acute Visit    Acute    HPI: Patient is 70 y.o. female who   Past Medical History:  Diagnosis Date  . Anemia   . Anginal pain ([place])   . Cataract   . Cataracts, bilateral   . CHF (congestive heart failure) ([place])   . Coronary artery disease   . Dementia   . Dental caries   . Dysphagia   . Esophageal reflux   . High cholesterol   . Hyperlipidemia   . Hypotension   . Hypothyroidism   . Optic atrophy   . Optic atrophy   . Osteoporosis   . 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 Paroxysmal atrial fibrillation (HCC)   . Seizures ([place])   . Sepsis ([place]) 01/2016  . Thyroid disease    hypothyroid  . Unspecified convulsions ([place])     Past Surgical History:  Procedure Laterality Date  . CENTRAL VENOUS CATHETER INSERTION  05/15/2014      . ESOPHAGOGASTRODUODENOSCOPY N/A 06/03/2014   Procedure: ESOPHAGOGASTRODUODENOSCOPY (EGD);  Surgeon: Beryle Beams, MD;  Location: [facility] ENDOSCOPY;  Service: Endoscopy;  Laterality: N/A;  . GASTROSTOMY TUBE PLACEMENT    . midline incision        Medication List       Accurate as of 06/15/16 11:25 AM. Always use your most recent med list.          acetaminophen 650 MG suppository Commonly known as:  TYLENOL Place 1 suppository (650 mg total) rectally every 6 (six) hours as needed for mild pain (or Fever >/=  101).   amoxicillin-clavulanate 400-57 MG/5ML suspension Commonly known as:  AUGMENTIN Place 10.9 mLs (875 mg total) into feeding tube every 12 (twelve) hours.   CENTAMIN Liqd Place 5 mLs into feeding tube daily.   clonazePAM 1 MG tablet Commonly known as:  KLONOPIN Place 1 mg into feeding tube every 8 (eight) hours.   docusate 50 MG/5ML liquid Commonly known as:  COLACE Place 20 mLs (200 mg total) into feeding tube 2 (two) times daily.   famotidine 20 MG tablet Commonly known as:  PEPCID Give 1 tablet via peg tube daily ( 20 mg )   feeding supplement (PRO-STAT SUGAR FREE 64) Liqd Place 30 mLs into feeding tube daily.   free water Soln Place 100 mLs into feeding tube every 4 (four) hours.   furosemide 20 MG tablet Commonly known as:  LASIX 20 mg by PEG Tube route 2 (two) times daily.   ipratropium-albuterol 0.5-2.5 (3) MG/3ML Soln Commonly known as:  DUONEB Take 3 mLs by nebulization every 6 (six) hours as needed.   levETIRAcetam 100 MG/ML solution Commonly known as:  KEPPRA Place 5 mLs (500 mg total) into feeding tube daily at 6 (six) AM.   levothyroxine 100 MCG tablet Commonly known as:  SYNTHROID, LEVOTHROID 100 mcg by PEG Tube route daily before breakfast.  metoprolol tartrate 25 MG tablet Commonly known as:  LOPRESSOR 12.5 mg by PEG Tube route 3 (three) times daily.   NUTRITIONAL SUPPLEMENT PO Two cal hn via peg at 39 cc/hr x 24 hours continuously   potassium chloride 20 MEQ/15ML (10%) Soln 20 mEq by PEG Tube route daily. Dilute with water before administered.   Valproic Acid 250 MG/5ML Syrp syrup Commonly known as:  DEPAKENE Place 15 mLs (750 mg total) into feeding tube 3 (three) times daily.       No orders of the defined types were placed in this encounter.   Immunization History  Administered Date(s) Administered  . Influenza-Unspecified 08/13/2014, 07/11/2015    Social History  Substance Use Topics  . Smoking status: Never Smoker  .  Smokeless tobacco: Never Used  . Alcohol use No    Review of Systems  DATA OBTAINED: from patient, nurse, medical record, family member GENERAL:  no fevers, fatigue, appetite changes SKIN: No itching, rash HEENT: No complaint RESPIRATORY: No cough, wheezing, SOB CARDIAC: No chest pain, palpitations, lower extremity edema  GI: No abdominal pain, No N/V/D or constipation, No heartburn or reflux  GU: No dysuria, frequency or urgency, or incontinence  MUSCULOSKELETAL: No unrelieved bone/joint pain NEUROLOGIC: No headache, dizziness  PSYCHIATRIC: No overt anxiety or sadness  Vitals:   06/14/16 1118  BP: (!) 90/57  Pulse: 70  Resp: 18  Temp: 97 F (36.1 C)    Physical Exam  GENERAL APPEARANCE: Alert, conversant, No acute distress  SKIN: No diaphoresis rash HEENT: Unremarkable RESPIRATORY: Breathing is even, unlabored. Lung sounds are clear   CARDIOVASCULAR: Heart RRR no murmurs, rubs or gallops. No peripheral edema  GASTROINTESTINAL: Abdomen is soft, non-tender, not distended w/ normal bowel sounds.  GENITOURINARY: Bladder non tender, not distended  MUSCULOSKELETAL: No abnormal joints or musculature NEUROLOGIC: Cranial nerves 2-12 grossly intact. Moves all extremities PSYCHIATRIC: Mood and affect appropriate to situation, no behavioral issues  Patient Active Problem List   Diagnosis Date Noted  . Persistent vegetative state ([place]) 06/09/2016  . Seizures ([place]) 02/15/2016  . PNA (pneumonia) 02/08/2016  . Severe mental retardation 01/10/2015  . Severe dementia 01/10/2015  . Senile debility 01/10/2015  . 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 Acute respiratory failure with hypoxia ([place]) 11/20/2014  . Generalized weakness 11/20/2014  . Dysphagia 11/20/2014  . Dementia without behavioral disturbance 11/20/2014  . Development delay 11/02/2014  . Acute respiratory distress (HCC) 11/02/2014  . Paroxysmal ventricular tachycardia ([place]) 10/30/2014  . HCAP (healthcare-associated  pneumonia) 10/26/2014  . Hematoma of right parietal scalp 08/13/2014  . Edema 08/09/2014  . Hematoma 08/05/2014  . Fall at nursing home 08/05/2014  . Leukocytosis 07/26/2014  . Tachycardia 07/11/2014  . Leukocytopenia, unspecified 07/11/2014  . Pneumonia 06/17/2014  . Sepsis ([place]) 06/12/2014  . Facial droop 06/12/2014  . Hypernatremia 06/12/2014  . PEG (percutaneous endoscopic gastrostomy) status ([place]) 06/12/2014  . FUO (fever of unknown origin) 06/02/2014  . Paroxysmal a-fib ([place]) 05/14/2014  . Dysphasia 05/14/2014  . UTI (lower urinary tract infection) 05/14/2014  . Tooth decay 05/14/2014  . SIRS (systemic inflammatory response syndrome) ([place]) 12/15/2013  . Thrombocytopenia ([place]) 10/29/2013  . Hypotension 10/29/2013  . Anemia of chronic disease 10/29/2013  . Diastolic heart failure, NYHA class 2 ([place]) 10/29/2013  . Atrial fibrillation with rapid ventricular response ([place]) 10/28/2013  . Seizure disorder ([place]) 10/28/2013  . Hypothyroidism 10/28/2013  . HLD (hyperlipidemia) 10/28/2013  . Non-ST elevation myocardial infarction (NSTEMI) ([place]) 10/28/2013    CBC  Component Value Date/Time   WBC 16.9 06/13/2016   WBC 16.8 (H) 06/07/2016 0848   RBC 3.59 (L) 06/07/2016 0848   HGB 12.1 06/13/2016   HCT 37 06/13/2016   HCT 25.4 (L) 11/03/2014 0725   PLT 399 06/13/2016   MCV 89.4 06/07/2016 0848   LYMPHSABS 2.5 06/04/2016 0657   MONOABS 2.5 (H) 06/04/2016 0657   EOSABS 0.0 06/04/2016 0657   BASOSABS 0.0 06/04/2016 0657    CMP     Component Value Date/Time   NA 136 (A) 06/13/2016   K 4.7 06/13/2016   CL 108 06/07/2016 1035   CO2 26 06/07/2016 1035   GLUCOSE 118 (H) 06/07/2016 1035   BUN 11 06/13/2016   CREATININE 0.4 (A) 06/13/2016   CREATININE 0.45 06/07/2016 1035   CALCIUM 8.5 (L) 06/07/2016 1035   PROT 7.4 06/04/2016 0657   ALBUMIN 2.5 (L) 06/04/2016 0657   AST 30 06/04/2016 0657   ALT 18 06/04/2016 0657   ALKPHOS 91 06/04/2016 0657   BILITOT 0.5 06/04/2016  0657   GFRNONAA >60 06/07/2016 1035   GFRAA >60 06/07/2016 1035    Assessment and Plan  No problem-specific Assessment & Plan notes found for this encounter.   Noah Delaine. Sheppard Coil, MD

## 2016-06-16 ENCOUNTER — Encounter: Payer: Self-pay | Admitting: Internal Medicine

## 2016-06-16 NOTE — Progress Notes (Signed)
This encounter was created in error - please disregard.

## 2016-06-16 NOTE — Progress Notes (Signed)
MRN: [id number] Name: Dawn [name]  Sex: female Age: 70 y.o. DOB: [birth year]  [place] #:  [facility]: SNF Provider: Inocencio Homes D Emergency Contacts: Extended Emergency Contact Information Primary Emergency Contact: Sellars,[name] Address: [address]          [place] [address] [place] Phone: [phone] Relation: Sister Secondary Emergency Contact: Luiza Cower [place] Phone: [phone] Relation: None  Code Status:   Allergies: Carvedilol and Ppd [tuberculin purified protein derivative]  Chief Complaint  Patient presents with  . Acute Visit    HPI: Patient is 70 y.o. female who I am seeing because a routine lab returned with a WBC of 16.9. Pt has had no reported cough, fever, or foul urine. She is mostly vegetative so I can't get a ROS FROM HER.  Past Medical History:  Diagnosis Date  . Anemia   . Anginal pain ([place])   . Cataract   . Cataracts, bilateral   . CHF (congestive heart failure) ([place])   . Coronary artery disease   . Dementia   . Dental caries   . Dysphagia   . Esophageal reflux   . High cholesterol   . Hyperlipidemia   . Hypotension   . Hypothyroidism   . Optic atrophy   . Optic atrophy   . Osteoporosis   . 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 Paroxysmal atrial fibrillation (HCC)   . Seizures ([place])   . Sepsis ([place]) 01/2016  . Thyroid disease    hypothyroid  . Unspecified convulsions ([place])     Past Surgical History:  Procedure Laterality Date  . CENTRAL VENOUS CATHETER INSERTION  05/15/2014      . ESOPHAGOGASTRODUODENOSCOPY N/A 06/03/2014   Procedure: ESOPHAGOGASTRODUODENOSCOPY (EGD);  Surgeon: Beryle Beams, MD;  [facility] ENDOSCOPY;  Service: Endoscopy;  Laterality: N/A;  . GASTROSTOMY TUBE PLACEMENT    . midline incision        Medication List       Accurate as of 06/14/16 11:59 PM. Always use your most recent med list.          acetaminophen 650 MG  suppository Commonly known as:  TYLENOL Place 1 suppository (650 mg total) rectally every 6 (six) hours as needed for mild pain (or Fever >/= 101).   amoxicillin-clavulanate 400-57 MG/5ML suspension Commonly known as:  AUGMENTIN Place 10.9 mLs (875 mg total) into feeding tube every 12 (twelve) hours.   CENTAMIN Liqd Place 5 mLs into feeding tube daily.   clonazePAM 1 MG tablet Commonly known as:  KLONOPIN Place 1 mg into feeding tube every 8 (eight) hours.   docusate 50 MG/5ML liquid Commonly known as:  COLACE Place 20 mLs (200 mg total) into feeding tube 2 (two) times daily.   famotidine 20 MG tablet Commonly known as:  PEPCID Give 1 tablet via peg tube daily ( 20 mg )   feeding supplement (PRO-STAT SUGAR FREE 64) Liqd Place 30 mLs into feeding tube daily.   free water Soln Place 100 mLs into feeding tube every 4 (four) hours.   furosemide 20 MG tablet Commonly known as:  LASIX 20 mg by PEG Tube route 2 (two) times daily.   ipratropium-albuterol 0.5-2.5 (3) MG/3ML Soln Commonly known as:  DUONEB Take 3 mLs by nebulization every 6 (six) hours as needed.   levETIRAcetam 100 MG/ML solution Commonly known as:  KEPPRA Place 5 mLs (500 mg total) into feeding tube daily at 6 (  six) AM.   levothyroxine 100 MCG tablet Commonly known as:  SYNTHROID, LEVOTHROID 100 mcg by PEG Tube route daily before breakfast.   metoprolol tartrate 25 MG tablet Commonly known as:  LOPRESSOR 12.5 mg by PEG Tube route 3 (three) times daily.   NUTRITIONAL SUPPLEMENT PO Two cal hn via peg at 39 cc/hr x 24 hours continuously   potassium chloride 20 MEQ/15ML (10%) Soln 20 mEq by PEG Tube route daily. Dilute with water before administered.   Valproic Acid 250 MG/5ML Syrp syrup Commonly known as:  DEPAKENE Place 15 mLs (750 mg total) into feeding tube 3 (three) times daily.       No orders of the defined types were placed in this encounter.   Immunization History  Administered Date(s)  Administered  . Influenza-Unspecified 08/13/2014, 07/11/2015    Social History  Substance Use Topics  . Smoking status: Never Smoker  . Smokeless tobacco: Never Used  . Alcohol use No    Review of Systems  UTO to vegetative state    Vitals:   06/16/16 1242  BP: 132/75  Pulse: 71  Resp: 19  Temp: 97.6 F (36.4 C)    Physical Exam  GENERAL APPEARANCE: eyes open, non conversant, No acute distress  SKIN: No diaphoresis rash HEENT: Unremarkable RESPIRATORY: Breathing is even, unlabored. Lung sounds are clear   CARDIOVASCULAR: Heart RRR no murmurs, rubs or gallops. No peripheral edema  GASTROINTESTINAL: Abdomen is soft, non-tender, not distended w/ normal bowel sounds.  GENITOURINARY: Bladder non tender, not distended  MUSCULOSKELETAL: wasting and contractures BUE.BLE NEUROLOGIC: responds to voice and touch, moved LUE PSYCHIATRIC: n/a, no behavioral issues  Patient Active Problem List   Diagnosis Date Noted  . Persistent vegetative state ([place]) 06/09/2016  . Seizures ([place]) 02/15/2016  . PNA (pneumonia) 02/08/2016  . Severe mental retardation 01/10/2015  . Severe dementia 01/10/2015  . Senile debility 01/10/2015  . 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 Acute respiratory failure with hypoxia ([place]) 11/20/2014  . Generalized weakness 11/20/2014  . Dysphagia 11/20/2014  . Dementia without behavioral disturbance 11/20/2014  . Development delay 11/02/2014  . Acute respiratory distress (HCC) 11/02/2014  . Paroxysmal ventricular tachycardia ([place]) 10/30/2014  . HCAP (healthcare-associated pneumonia) 10/26/2014  . Hematoma of right parietal scalp 08/13/2014  . Edema 08/09/2014  . Hematoma 08/05/2014  . Fall at nursing home 08/05/2014  . Leukocytosis 07/26/2014  . Tachycardia 07/11/2014  . Leukocytopenia, unspecified 07/11/2014  . Pneumonia 06/17/2014  . Sepsis ([place]) 06/12/2014  . Facial droop 06/12/2014  . Hypernatremia 06/12/2014  . PEG (percutaneous endoscopic  gastrostomy) status ([place]) 06/12/2014  . FUO (fever of unknown origin) 06/02/2014  . Paroxysmal a-fib ([place]) 05/14/2014  . Dysphasia 05/14/2014  . UTI (lower urinary tract infection) 05/14/2014  . Tooth decay 05/14/2014  . SIRS (systemic inflammatory response syndrome) ([place]) 12/15/2013  . Thrombocytopenia ([place]) 10/29/2013  . Hypotension 10/29/2013  . Anemia of chronic disease 10/29/2013  . Diastolic heart failure, NYHA class 2 ([place]) 10/29/2013  . Atrial fibrillation with rapid ventricular response ([place]) 10/28/2013  . Seizure disorder ([place]) 10/28/2013  . Hypothyroidism 10/28/2013  . HLD (hyperlipidemia) 10/28/2013  . Non-ST elevation myocardial infarction (NSTEMI) ([place]) 10/28/2013    CBC    Component Value Date/Time   WBC 16.9 06/13/2016   WBC 16.8 (H) 06/07/2016 0848   RBC 3.59 (L) 06/07/2016 0848   HGB 12.1 06/13/2016   HCT 37 06/13/2016   HCT 25.4 (L) 11/03/2014 0725   PLT 399 06/13/2016   MCV 89.4 06/07/2016  0848   LYMPHSABS 2.5 06/04/2016 0657   MONOABS 2.5 (H) 06/04/2016 0657   EOSABS 0.0 06/04/2016 0657   BASOSABS 0.0 06/04/2016 0657    CMP     Component Value Date/Time   NA 136 (A) 06/13/2016   K 4.7 06/13/2016   CL 108 06/07/2016 1035   CO2 26 06/07/2016 1035   GLUCOSE 118 (H) 06/07/2016 1035   BUN 11 06/13/2016   CREATININE 0.4 (A) 06/13/2016   CREATININE 0.45 06/07/2016 1035   CALCIUM 8.5 (L) 06/07/2016 1035   PROT 7.4 06/04/2016 0657   ALBUMIN 2.5 (L) 06/04/2016 0657   AST 30 06/04/2016 0657   ALT 18 06/04/2016 0657   ALKPHOS 91 06/04/2016 0657   BILITOT 0.5 06/04/2016 0657   GFRNONAA >60 06/07/2016 1035   GFRAA >60 06/07/2016 1035    Assessment and Plan  ELEVATED WBC -  There are no signs of infection from PE; will order rine with c?S and CXR, PA and Lat; will monitor clinically   Time spent > 25 min;> 50% of time with patient was spent reviewing records, labs, tests and studies, counseling and developing plan of care  Hennie Duos,  MD

## 2016-06-28 ENCOUNTER — Non-Acute Institutional Stay (SKILLED_NURSING_FACILITY): Payer: Medicare Other | Admitting: Internal Medicine

## 2016-06-28 ENCOUNTER — Encounter: Payer: Self-pay | Admitting: Internal Medicine

## 2016-06-28 DIAGNOSIS — G2571 Drug induced akathisia: Secondary | ICD-10-CM

## 2016-06-28 DIAGNOSIS — R1310 Dysphagia, unspecified: Secondary | ICD-10-CM

## 2016-06-28 DIAGNOSIS — R450 Nervousness: Secondary | ICD-10-CM | POA: Diagnosis not present

## 2016-06-28 DIAGNOSIS — I5030 Unspecified diastolic (congestive) heart failure: Secondary | ICD-10-CM | POA: Diagnosis not present

## 2016-06-28 NOTE — Progress Notes (Signed)
MRN: [id number] Name: Dawn [name]  Sex: female Age: 70 y.o. DOB: 06/15/1946  [place] #:  Facility/Room: [facility]: SNF Provider: Noah Delaine. Sheppard Coil, MD Emergency Contacts: Extended Emergency Contact Information Primary Emergency Contact: Sellars,[name] Address: [address]          [place] [address] [place] Phone: [phone] Relation: Sister Secondary Emergency Contact: Rella Cower [place] Phone: [phone] Relation: None  Code Status: DNR  Allergies: Carvedilol and Ppd [tuberculin purified protein derivative]  Chief Complaint  Patient presents with  . Medical Management of Chronic Issues    Routine Visit    HPI: Patient is 71 y.o. female who is in chronic vegetative state who is being seen for CHF, dysphagia, and movement disorder.  Past Medical History:  Diagnosis Date  . Anemia   . Anginal pain ([place])   . Cataract   . Cataracts, bilateral   . CHF (congestive heart failure) ([place])   . Coronary artery disease   . Dementia   . Dental caries   . Dysphagia   . Esophageal reflux   . High cholesterol   . Hyperlipidemia   . Hypotension   . Hypothyroidism   . Optic atrophy   . Optic atrophy   . Osteoporosis   . 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 Paroxysmal atrial fibrillation (HCC)   . Seizures ([place])   . Sepsis ([place]) 01/2016  . Thyroid disease    hypothyroid  . Unspecified convulsions ([place])     Past Surgical History:  Procedure Laterality Date  . CENTRAL VENOUS CATHETER INSERTION  05/15/2014      . ESOPHAGOGASTRODUODENOSCOPY N/A 06/03/2014   Procedure: ESOPHAGOGASTRODUODENOSCOPY (EGD);  Surgeon: Beryle Beams, MD;  Location: [facility] ENDOSCOPY;  Service: Endoscopy;  Laterality: N/A;  . GASTROSTOMY TUBE PLACEMENT    . midline incision        Medication List       Accurate as of 06/28/16 11:59 PM. Always use your most recent med list.          acetaminophen 650 MG suppository Commonly  known as:  TYLENOL Place 1 suppository (650 mg total) rectally every 6 (six) hours as needed for mild pain (or Fever >/= 101).   CENTAMIN Liqd Place 5 mLs into feeding tube daily.   clonazePAM 1 MG tablet Commonly known as:  KLONOPIN Place 1 mg into feeding tube every 8 (eight) hours.   docusate 50 MG/5ML liquid Commonly known as:  COLACE Place 20 mLs (200 mg total) into feeding tube 2 (two) times daily.   famotidine 20 MG tablet Commonly known as:  PEPCID Give 1 tablet via peg tube daily ( 20 mg )   feeding supplement (PRO-STAT SUGAR FREE 64) Liqd Place 30 mLs into feeding tube daily.   free water Soln Place 100 mLs into feeding tube every 4 (four) hours.   furosemide 20 MG tablet Commonly known as:  LASIX 20 mg by PEG Tube route 2 (two) times daily.   ipratropium-albuterol 0.5-2.5 (3) MG/3ML Soln Commonly known as:  DUONEB Take 3 mLs by nebulization every 6 (six) hours as needed.   levETIRAcetam 100 MG/ML solution Commonly known as:  KEPPRA Place 5 mLs (500 mg total) into feeding tube daily at 6 (six) AM.   levothyroxine 100 MCG tablet Commonly known as:  SYNTHROID, LEVOTHROID 100 mcg by PEG Tube route daily before breakfast.   metoprolol tartrate 25 MG tablet Commonly  known as:  LOPRESSOR 12.5 mg by PEG Tube route 3 (three) times daily.   NUTRITIONAL SUPPLEMENT PO Two cal hn via peg at 39 cc/hr x 24 hours continuously   potassium chloride 20 MEQ/15ML (10%) Soln 20 mEq by PEG Tube route daily. Dilute with water before administered.   Valproic Acid 250 MG/5ML Syrp syrup Commonly known as:  DEPAKENE Place 15 mLs (750 mg total) into feeding tube 3 (three) times daily.       No orders of the defined types were placed in this encounter.   Immunization History  Administered Date(s) Administered  . Influenza-Unspecified 08/13/2014, 07/11/2015    Social History  Substance Use Topics  . Smoking status: Never Smoker  . Smokeless tobacco: Never Used  .  Alcohol use No    Review of Systems; UTO 2/2 veg state    Vitals:   06/28/16 1041  BP: 131/75  Pulse: 90  Resp: 19  Temp: 98 F (36.7 C)    Physical Exam  GENERAL APPEARANCE: eyes open, non conversant, No acute distress  SKIN: No diaphoresis rash HEENT: Unremarkable RESPIRATORY: Breathing is even, unlabored. Lung sounds are clear   CARDIOVASCULAR: Heart RRR no murmurs, rubs or gallops. No peripheral edema  GASTROINTESTINAL: Abdomen is soft, non-tender, not distended w/ normal bowel sounds; PEG tube.  GENITOURINARY: Bladder non tender, not distended  MUSCULOSKELETAL: contracture and wasting NEUROLOGIC: chronic vegetative state, can move upper extremities PSYCHIATRIC: n/a  Patient Active Problem List   Diagnosis Date Noted  . Persistent vegetative state ([place]) 06/09/2016  . Seizures ([place]) 02/15/2016  . PNA (pneumonia) 02/08/2016  . Severe mental retardation 01/10/2015  . Severe dementia 01/10/2015  . Senile debility 01/10/2015  . 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 Acute respiratory failure with hypoxia ([place]) 11/20/2014  . Generalized weakness 11/20/2014  . Dysphagia 11/20/2014  . Dementia without behavioral disturbance 11/20/2014  . Development delay 11/02/2014  . Acute respiratory distress (HCC) 11/02/2014  . Paroxysmal ventricular tachycardia ([place]) 10/30/2014  . HCAP (healthcare-associated pneumonia) 10/26/2014  . Hematoma of right parietal scalp 08/13/2014  . Edema 08/09/2014  . Hematoma 08/05/2014  . Fall at nursing home 08/05/2014  . Leukocytosis 07/26/2014  . Tachycardia 07/11/2014  . Leukocytopenia, unspecified 07/11/2014  . Pneumonia 06/17/2014  . Sepsis ([place]) 06/12/2014  . Facial droop 06/12/2014  . Hypernatremia 06/12/2014  . PEG (percutaneous endoscopic gastrostomy) status ([place]) 06/12/2014  . FUO (fever of unknown origin) 06/02/2014  . Paroxysmal a-fib ([place]) 05/14/2014  . Dysphasia 05/14/2014  . UTI (lower urinary tract infection) 05/14/2014   . Tooth decay 05/14/2014  . SIRS (systemic inflammatory response syndrome) ([place]) 12/15/2013  . Thrombocytopenia ([place]) 10/29/2013  . Hypotension 10/29/2013  . Anemia of chronic disease 10/29/2013  . Diastolic heart failure, NYHA class 2 ([place]) 10/29/2013  . Atrial fibrillation with rapid ventricular response ([place]) 10/28/2013  . Seizure disorder ([place]) 10/28/2013  . Hypothyroidism 10/28/2013  . HLD (hyperlipidemia) 10/28/2013  . Non-ST elevation myocardial infarction (NSTEMI) ([place]) 10/28/2013    CBC    Component Value Date/Time   WBC 16.9 06/13/2016   WBC 16.9 06/13/2016   WBC 16.8 (H) 06/07/2016 0848   RBC 3.59 (L) 06/07/2016 0848   HGB 12.1 06/13/2016   HGB 12.1 06/13/2016   HCT 37 06/13/2016   HCT 37 06/13/2016   HCT 25.4 (L) 11/03/2014 0725   PLT 399 06/13/2016   PLT 399 06/13/2016   MCV 89.4 06/07/2016 0848   LYMPHSABS 2.5 06/04/2016 0657   MONOABS 2.5 (H) 06/04/2016 [id number]  EOSABS 0.0 06/04/2016 0657   BASOSABS 0.0 06/04/2016 0657    CMP     Component Value Date/Time   NA 136 (A) 06/13/2016   NA 136 (A) 06/13/2016   K 4.7 06/13/2016   K 4.7 06/13/2016   CL 108 06/07/2016 1035   CO2 26 06/07/2016 1035   GLUCOSE 118 (H) 06/07/2016 1035   BUN 11 06/13/2016   BUN 11 06/13/2016   CREATININE 0.4 (A) 06/13/2016   CREATININE 0.4 (A) 06/13/2016   CREATININE 0.45 06/07/2016 1035   CALCIUM 8.5 (L) 06/07/2016 1035   PROT 7.4 06/04/2016 0657   ALBUMIN 2.5 (L) 06/04/2016 0657   AST 30 06/04/2016 0657   ALT 18 06/04/2016 0657   ALKPHOS 91 06/04/2016 0657   BILITOT 0.5 06/04/2016 0657   GFRNONAA >60 06/07/2016 1035   GFRAA >60 06/07/2016 1035    Assessment and Plan  Diastolic heart failure, NYHA class 2 No recent exacerbations or unusual weight gains;plan to cont metoprolol 12.5 mg TID and Lasix 20 mg BID; cont to monitor weights  Dysphagia Chromic and unchanging;plan to cont PEG tube per family desires  Restless movement disorder Has been controlled with  Klonopin;plan to cont klonopin 1 mg q 8   Andreah Goheen D. Sheppard Coil, MD

## 2016-07-01 ENCOUNTER — Encounter: Payer: Self-pay | Admitting: Internal Medicine

## 2016-07-01 NOTE — Assessment & Plan Note (Signed)
No recent exacerbations or unusual weight gains;plan to cont metoprolol 12.5 mg TID and Lasix 20 mg BID; cont to monitor weights

## 2016-07-01 NOTE — Assessment & Plan Note (Signed)
Chromic and unchanging;plan to cont PEG tube per family desires

## 2016-07-01 NOTE — Assessment & Plan Note (Signed)
Has been controlled with Klonopin;plan to cont klonopin 1 mg q 8

## 2016-07-25 ENCOUNTER — Encounter: Payer: Self-pay | Admitting: Internal Medicine

## 2016-07-25 ENCOUNTER — Non-Acute Institutional Stay (SKILLED_NURSING_FACILITY): Payer: Medicare Other | Admitting: Internal Medicine

## 2016-07-25 DIAGNOSIS — F0280 Dementia in other diseases classified elsewhere without behavioral disturbance: Secondary | ICD-10-CM | POA: Diagnosis not present

## 2016-07-25 DIAGNOSIS — G40909 Epilepsy, unspecified, not intractable, without status epilepticus: Secondary | ICD-10-CM | POA: Diagnosis not present

## 2016-07-25 DIAGNOSIS — F72 Severe intellectual disabilities: Secondary | ICD-10-CM

## 2016-07-25 NOTE — Progress Notes (Signed)
Location:  [facility] Room Number: [place] of Service:  SNF [phone])  Inocencio Homes, MD  Patient Care Team: Hennie Duos, MD as PCP - General (Internal Medicine)  Extended Emergency Contact Information Primary Emergency Contact: Sellars,[name] Address: [place]          [place] [address] [place] Phone: [phone] Relation: Sister Secondary Emergency Contact: Liz Cower [place] of [place] Phone: [phone] Relation: None    Allergies: Carvedilol and Ppd [tuberculin purified protein derivative]  Chief Complaint  Patient presents with  . Medical Management of Chronic Issues    Routine Visit    HPI: Patient is 70 y.o. female vegetative who is being seen for routine issues of MR, dementia and seizure disorder.  Past Medical History:  Diagnosis Date  . Anemia   . Anginal pain ([place])   . Cataract   . Cataracts, bilateral   . CHF (congestive heart failure) ([place])   . Coronary artery disease   . Dementia   . Dental caries   . Dysphagia   . Esophageal reflux   . High cholesterol   . Hyperlipidemia   . Hypotension   . Hypothyroidism   . Optic atrophy   . Optic atrophy   . Osteoporosis   . 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 Paroxysmal atrial fibrillation (HCC)   . Seizures ([place])   . Sepsis ([place]) 01/2016  . Thyroid disease    hypothyroid  . Unspecified convulsions ([place])     Past Surgical History:  Procedure Laterality Date  . CENTRAL VENOUS CATHETER INSERTION  05/15/2014      . ESOPHAGOGASTRODUODENOSCOPY N/A 06/03/2014   Procedure: ESOPHAGOGASTRODUODENOSCOPY (EGD);  Surgeon: Beryle Beams, MD;  Location: [facility] ENDOSCOPY;  Service: Endoscopy;  Laterality: N/A;  . GASTROSTOMY TUBE PLACEMENT    . midline incision        Medication List       Accurate as of 07/25/16 11:59 PM. Always use your most recent med list.          acetaminophen 650 MG suppository Commonly known as:   TYLENOL Place 1 suppository (650 mg total) rectally every 6 (six) hours as needed for mild pain (or Fever >/= 101).   CENTAMIN Liqd Place 5 mLs into feeding tube daily.   clonazePAM 1 MG tablet Commonly known as:  KLONOPIN Place 1 mg into feeding tube every 8 (eight) hours.   docusate 50 MG/5ML liquid Commonly known as:  COLACE Place 20 mLs (200 mg total) into feeding tube 2 (two) times daily.   famotidine 20 MG tablet Commonly known as:  PEPCID Give 1 tablet via peg tube daily ( 20 mg )   feeding supplement (PRO-STAT SUGAR FREE 64) Liqd Place 30 mLs into feeding tube daily.   free water Soln Place 100 mLs into feeding tube every 4 (four) hours.   furosemide 20 MG tablet Commonly known as:  LASIX 20 mg by PEG Tube route 2 (two) times daily.   ipratropium-albuterol 0.5-2.5 (3) MG/3ML Soln Commonly known as:  DUONEB Take 3 mLs by nebulization every 6 (six) hours as needed.   levETIRAcetam 100 MG/ML solution Commonly known as:  KEPPRA Place 5 mLs (500 mg total) into feeding tube daily at 6 (six) AM.   levothyroxine 100 MCG tablet Commonly known as:  SYNTHROID, LEVOTHROID 100 mcg by PEG Tube route daily before breakfast.   metoprolol tartrate 25 MG tablet Commonly known  as:  LOPRESSOR 12.5 mg by PEG Tube route 3 (three) times daily.   NUTRITIONAL SUPPLEMENT PO Two cal hn via peg at 39 cc/hr x 24 hours continuously   potassium chloride 20 MEQ/15ML (10%) Soln 20 mEq by PEG Tube route daily. Dilute with water before administered.   Valproic Acid 250 MG/5ML Syrp syrup Commonly known as:  DEPAKENE Place 15 mLs (750 mg total) into feeding tube 3 (three) times daily.       No orders of the defined types were placed in this encounter.   Immunization History  Administered Date(s) Administered  . Influenza-Unspecified 08/13/2014, 07/11/2015, 07/13/2016    Social History  Substance Use Topics  . Smoking status: Never Smoker  . Smokeless tobacco: Never Used  .  Alcohol use No    Review of   UTO 2/2 vegetation    Vitals:   07/25/16 1437  BP: 131/75  Pulse: 95  Resp: 19  Temp: 97 F (36.1 C)   Body mass index is 25.61 kg/m. Physical Exam  GENERAL APPEARANCE: eyes opent, non conversant, No acute distress  SKIN: No diaphoresis rash HEENT: Unremarkable RESPIRATORY: Breathing is even, unlabored. Lung sounds are clear   CARDIOVASCULAR: Heart RRR no murmurs, rubs or gallops. No peripheral edema  GASTROINTESTINAL: Abdomen is soft, non-tender, not distended w/ normal bowel sounds.  GENITOURINARY: Bladder non tender, not distended  MUSCULOSKELETAL: wasting and contractures upper amd lower ext NEUROLOGIC: vegetative, occ responds to deep touch, occ moves upper ext nonpurposefully PSYCHIATRIC: n/a  Patient Active Problem List   Diagnosis Date Noted  . Persistent vegetative state ([place]) 06/09/2016  . Seizures ([place]) 02/15/2016  . PNA (pneumonia) 02/08/2016  . Severe mental retardation 01/10/2015  . Severe dementia 01/10/2015  . Senile debility 01/10/2015  . 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 Acute respiratory failure with hypoxia ([place]) 11/20/2014  . Generalized weakness 11/20/2014  . Dysphagia 11/20/2014  . Dementia without behavioral disturbance 11/20/2014  . Development delay 11/02/2014  . Acute respiratory distress 11/02/2014  . Paroxysmal ventricular tachycardia ([place]) 10/30/2014  . HCAP (healthcare-associated pneumonia) 10/26/2014  . Hematoma of right parietal scalp 08/13/2014  . Edema 08/09/2014  . Hematoma 08/05/2014  . Fall at nursing home 08/05/2014  . Leukocytosis 07/26/2014  . Tachycardia 07/11/2014  . Leukocytopenia, unspecified 07/11/2014  . Pneumonia 06/17/2014  . Sepsis ([place]) 06/12/2014  . Facial droop 06/12/2014  . Hypernatremia 06/12/2014  . PEG (percutaneous endoscopic gastrostomy) status ([place]) 06/12/2014  . FUO (fever of unknown origin) 06/02/2014  . Paroxysmal a-fib ([place]) 05/14/2014  . Dysphasia  05/14/2014  . UTI (lower urinary tract infection) 05/14/2014  . Tooth decay 05/14/2014  . SIRS (systemic inflammatory response syndrome) ([place]) 12/15/2013  . Thrombocytopenia ([place]) 10/29/2013  . Hypotension 10/29/2013  . Anemia of chronic disease 10/29/2013  . Diastolic heart failure, NYHA class 2 ([place]) 10/29/2013  . Atrial fibrillation with rapid ventricular response ([place]) 10/28/2013  . Seizure disorder ([place]) 10/28/2013  . Hypothyroidism 10/28/2013  . HLD (hyperlipidemia) 10/28/2013  . Non-ST elevation myocardial infarction (NSTEMI) ([place]) 10/28/2013    CMP     Component Value Date/Time   NA 136 (A) 06/13/2016   NA 136 (A) 06/13/2016   K 4.7 06/13/2016   K 4.7 06/13/2016   CL 108 06/07/2016 1035   CO2 26 06/07/2016 1035   GLUCOSE 118 (H) 06/07/2016 1035   BUN 11 06/13/2016   BUN 11 06/13/2016   CREATININE 0.4 (A) 06/13/2016   CREATININE 0.4 (A) 06/13/2016   CREATININE 0.45 06/07/2016 1035  CALCIUM 8.5 (L) 06/07/2016 1035   PROT 7.4 06/04/2016 0657   ALBUMIN 2.5 (L) 06/04/2016 0657   AST 30 06/04/2016 0657   ALT 18 06/04/2016 0657   ALKPHOS 91 06/04/2016 0657   BILITOT 0.5 06/04/2016 0657   GFRNONAA >60 06/07/2016 1035   GFRAA >60 06/07/2016 1035    Recent Labs  02/12/16 1109 02/12/16 1542  06/05/16 0540  06/06/16 0523 06/07/16 06/07/16 1035 06/13/16  NA  --   --   < > 138  < > 138 140 140 136*  136*  K  --  3.6  < > 4.0  < > 3.2*  --  3.6 4.7  4.7  CL  --   --   < > 109  --  107  --  108  --   CO2  --   --   < > 23  --  24  --  26  --   GLUCOSE  --   --   < > 80  --  155*  --  118*  --   BUN  --   --   < > 15  < > 20 18 18 11  11   CREATININE  --   --   < > 0.46  < > 0.46 0.5 0.45 0.4*  0.4*  CALCIUM  --   --   < > 8.6*  --  8.6*  --  8.5*  --   MG 2.0 2.0  --   --   --   --   --   --   --   < > = values in this interval not displayed.  Recent Labs  02/08/16 1021 04/16/16 06/04/16 0657  AST 29 17 30   ALT 17 13 18   ALKPHOS 96 124 91  BILITOT 0.6  --   0.5  PROT 7.4  --  7.4  ALBUMIN 2.5*  --  2.5*    Recent Labs  02/08/16 1021  06/03/16 2204  06/04/16 0657  06/05/16 0540  06/06/16 0523 06/07/16 06/07/16 0848 06/13/16  WBC 23.4*  < > 35.0*  < > 42.4*  < > 31.9*  < > 25.8* 16.8 16.8* 16.9  16.9  NEUTROABS 17.6*  --  31.8*  --  37.4*  --   --   --   --   --   --   --   HGB 15.5*  < > 14.8  < > 12.9  < > 12.7  < > 11.8*  --  10.9* 12.1  12.1  HCT 45.9  < > 45.5  < > 39.5  < > 38.7  < > 35.2*  --  32.1* 37  37  MCV 95.4  < > 94.0  --  93.6  --  92.4  --  91.0  --  89.4  --   PLT 160  < > 297  < > 275  < > 252  < > 205  --  181 399  399  < > = values in this interval not displayed. No results for input(s): CHOL, LDLCALC, TRIG in the last 8760 hours.  Invalid input(s): HCL No results found for: [facility] Lab Results  Component Value Date   TSH 3.04 12/27/2014   No results found for: HGBA1C Lab Results  Component Value Date   CHOL 117 07/27/2014   HDL 22 (A) 07/27/2014   LDLCALC 72 07/27/2014   TRIG 116 07/27/2014    Significant Diagnostic Results in last 30  days:  No results found.  Assessment and Plan  Severe mental retardation Unchanging, unchangeable, led to early dementia, cannot differentiate the two; pt mostly vegetative; cont supportive care  Dementia without behavioral disturbance Occurred early due to lifelong MR; pt is vegetative; cont supportive care  Seizure disorder No known seizures in remembrance ;plan to cont keppra 500 mg PT daily     Arizbeth Cawthorn D. Sheppard Coil, MD

## 2016-07-29 ENCOUNTER — Encounter: Payer: Self-pay | Admitting: Internal Medicine

## 2016-07-29 NOTE — Assessment & Plan Note (Signed)
No known seizures in remembrance ;plan to cont keppra 500 mg PT daily

## 2016-07-29 NOTE — Assessment & Plan Note (Signed)
Unchanging, unchangeable, led to early dementia, cannot differentiate the two; pt mostly vegetative; cont supportive care

## 2016-07-29 NOTE — Assessment & Plan Note (Signed)
Occurred early due to lifelong MR; pt is vegetative; cont supportive care

## 2016-08-23 ENCOUNTER — Encounter: Payer: Self-pay | Admitting: Internal Medicine

## 2016-08-23 ENCOUNTER — Non-Acute Institutional Stay (SKILLED_NURSING_FACILITY): Payer: Medicare Other | Admitting: Internal Medicine

## 2016-08-23 DIAGNOSIS — I480 Paroxysmal atrial fibrillation: Secondary | ICD-10-CM | POA: Diagnosis not present

## 2016-08-23 DIAGNOSIS — I5030 Unspecified diastolic (congestive) heart failure: Secondary | ICD-10-CM | POA: Diagnosis not present

## 2016-08-23 DIAGNOSIS — E034 Atrophy of thyroid (acquired): Secondary | ICD-10-CM

## 2016-08-23 NOTE — Progress Notes (Signed)
Location:  [facility] [place] Room Number: [phone] Place of Service:  SNF [phone])  Inocencio Homes, MD  Patient Care Team: Hennie Duos, MD as PCP - General (Internal Medicine)  Extended Emergency Contact Information Primary Emergency Contact: Sellars,[name] Address: [place]          [place] [address] [place] Phone: [phone] Relation: Sister Secondary Emergency Contact: Maeryn Cower [place] of [place] Phone: [phone] Relation: None    Allergies: Carvedilol and Ppd [tuberculin purified protein derivative]  Chief Complaint  Patient presents with  . Medical Management of Chronic Issues    Routine Visit    HPI: Patient is 70 y.o. female who is being seen for routine issues of CHF, PAF and hypothyroidism.  Past Medical History:  Diagnosis Date  . Anemia   . Anginal pain ([place])   . Cataract   . Cataracts, bilateral   . CHF (congestive heart failure) ([place])   . Coronary artery disease   . Dementia   . Dental caries   . Dysphagia   . Esophageal reflux   . High cholesterol   . Hyperlipidemia   . Hypotension   . Hypothyroidism   . Optic atrophy   . Optic atrophy   . Osteoporosis   . 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 Paroxysmal atrial fibrillation (HCC)   . Seizures ([place])   . Sepsis ([place]) 01/2016  . Thyroid disease    hypothyroid  . Unspecified convulsions ([place])     Past Surgical History:  Procedure Laterality Date  . CENTRAL VENOUS CATHETER INSERTION  05/15/2014      . ESOPHAGOGASTRODUODENOSCOPY N/A 06/03/2014   Procedure: ESOPHAGOGASTRODUODENOSCOPY (EGD);  Surgeon: Beryle Beams, MD;  Location[facility] ENDOSCOPY;  Service: Endoscopy;  Laterality: N/A;  . GASTROSTOMY TUBE PLACEMENT    . midline incision        Medication List       Accurate as of 08/23/16 11:59 PM. Always use your most recent med list.          acetaminophen 650 MG suppository Commonly known as:  TYLENOL Place 1 suppository  (650 mg total) rectally every 6 (six) hours as needed for mild pain (or Fever >/= 101).   CENTAMIN Liqd Place 5 mLs into feeding tube daily.   clonazePAM 1 MG tablet Commonly known as:  KLONOPIN Place 1 mg into feeding tube every 8 (eight) hours.   docusate 50 MG/5ML liquid Commonly known as:  COLACE Place 20 mLs (200 mg total) into feeding tube 2 (two) times daily.   famotidine 20 MG tablet Commonly known as:  PEPCID Give 1 tablet via peg tube daily ( 20 mg )   feeding supplement (PRO-STAT SUGAR FREE 64) Liqd Place 30 mLs into feeding tube daily.   free water Soln Place 100 mLs into feeding tube every 4 (four) hours.   furosemide 20 MG tablet Commonly known as:  LASIX 20 mg by PEG Tube route 2 (two) times daily.   levETIRAcetam 100 MG/ML solution Commonly known as:  KEPPRA Place 5 mLs (500 mg total) into feeding tube daily at 6 (six) AM.   levothyroxine 100 MCG tablet Commonly known as:  SYNTHROID, LEVOTHROID 100 mcg by PEG Tube route daily before breakfast.   metoprolol tartrate 25 MG tablet Commonly known as:  LOPRESSOR 12.5 mg by PEG Tube route 3 (three) times daily.   NUTRITIONAL SUPPLEMENT PO Two cal hn via peg at 39 cc/hr  x 24 hours continuously   potassium chloride 20 MEQ/15ML (10%) Soln 20 mEq by PEG Tube route daily. Dilute with water before administered.   Valproic Acid 250 MG/5ML Syrp syrup Commonly known as:  DEPAKENE Place 15 mLs (750 mg total) into feeding tube 3 (three) times daily.       No orders of the defined types were placed in this encounter.   Immunization History  Administered Date(s) Administered  . Influenza-Unspecified 08/13/2014, 07/11/2015, 07/13/2016    Social History  Substance Use Topics  . Smoking status: Never Smoker  . Smokeless tobacco: Never Used  . Alcohol use No    Review of Systems  UTO 2/2 vegetative state    Vitals:   08/23/16 1015  BP: 131/75  Pulse: 92  Resp: 18  Temp: 97.9 F (36.6 C)   Body  mass index is 26.37 kg/m. Physical Exam  GENERAL APPEARANCE: eyes closed No acute distress  SKIN: No diaphoresis rash HEENT: Unremarkable RESPIRATORY: Breathing is even, unlabored. Lung sounds are clear   CARDIOVASCULAR: Heart RRR no murmurs, rubs or gallops. No peripheral edema  GASTROINTESTINAL: Abdomen is soft, non-tender, not distended w/ normal bowel sounds;PEG tube  GENITOURINARY: Bladder non tender, not distended  MUSCULOSKELETAL: wasting and contractures all extremitis NEUROLOGIC: vegetative;some involuntary movement of BUE PSYCHIATRIC:n/a  Patient Active Problem List   Diagnosis Date Noted  . Persistent vegetative state ([place]) 06/09/2016  . Seizures ([place]) 02/15/2016  . PNA (pneumonia) 02/08/2016  . Severe mental retardation 01/10/2015  . Severe dementia 01/10/2015  . Senile debility 01/10/2015  . 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 Acute respiratory failure with hypoxia ([place]) 11/20/2014  . Generalized weakness 11/20/2014  . Dysphagia 11/20/2014  . Dementia without behavioral disturbance 11/20/2014  . Development delay 11/02/2014  . Acute respiratory distress 11/02/2014  . Paroxysmal ventricular tachycardia ([place]) 10/30/2014  . HCAP (healthcare-associated pneumonia) 10/26/2014  . Hematoma of right parietal scalp 08/13/2014  . Edema 08/09/2014  . Hematoma 08/05/2014  . Fall at nursing home 08/05/2014  . Leukocytosis 07/26/2014  . Tachycardia 07/11/2014  . Leukocytopenia, unspecified 07/11/2014  . Pneumonia 06/17/2014  . Sepsis ([place]) 06/12/2014  . Facial droop 06/12/2014  . Hypernatremia 06/12/2014  . PEG (percutaneous endoscopic gastrostomy) status ([place]) 06/12/2014  . FUO (fever of unknown origin) 06/02/2014  . Paroxysmal a-fib ([place]) 05/14/2014  . Dysphasia 05/14/2014  . UTI (lower urinary tract infection) 05/14/2014  . Tooth decay 05/14/2014  . SIRS (systemic inflammatory response syndrome) ([place]) 12/15/2013  . Thrombocytopenia ([place]) 10/29/2013  .  Hypotension 10/29/2013  . Anemia of chronic disease 10/29/2013  . Diastolic heart failure, NYHA class 2 ([place]) 10/29/2013  . Atrial fibrillation with rapid ventricular response ([place]) 10/28/2013  . Seizure disorder ([place]) 10/28/2013  . Hypothyroidism 10/28/2013  . HLD (hyperlipidemia) 10/28/2013  . Non-ST elevation myocardial infarction (NSTEMI) ([place]) 10/28/2013    CMP     Component Value Date/Time   NA 136 (A) 06/13/2016   NA 136 (A) 06/13/2016   K 4.7 06/13/2016   K 4.7 06/13/2016   CL 108 06/07/2016 1035   CO2 26 06/07/2016 1035   GLUCOSE 118 (H) 06/07/2016 1035   BUN 11 06/13/2016   BUN 11 06/13/2016   CREATININE 0.4 (A) 06/13/2016   CREATININE 0.4 (A) 06/13/2016   CREATININE 0.45 06/07/2016 1035   CALCIUM 8.5 (L) 06/07/2016 1035   PROT 7.4 06/04/2016 0657   ALBUMIN 2.5 (L) 06/04/2016 0657   AST 30 06/04/2016 0657   ALT 18 06/04/2016 0657   ALKPHOS  91 06/04/2016 0657   BILITOT 0.5 06/04/2016 0657   GFRNONAA >60 06/07/2016 1035   GFRAA >60 06/07/2016 1035    Recent Labs  02/12/16 1109 02/12/16 1542  06/05/16 0540  06/06/16 0523  06/07/16 1035 06/12/16 06/13/16  NA  --   --   < > 138  < > 138  < > 140 136* 136*  136*  K  --  3.6  < > 4.0  < > 3.2*  --  3.6 4.7 4.7  4.7  CL  --   --   < > 109  --  107  --  108  --   --   CO2  --   --   < > 23  --  24  --  26  --   --   GLUCOSE  --   --   < > 80  --  155*  --  118*  --   --   BUN  --   --   < > 15  < > 20  < > 18 11 11  11   CREATININE  --   --   < > 0.46  < > 0.46  < > 0.45 0.4* 0.4*  0.4*  CALCIUM  --   --   < > 8.6*  --  8.6*  --  8.5*  --   --   MG 2.0 2.0  --   --   --   --   --   --   --   --   < > = values in this interval not displayed.  Recent Labs  02/08/16 1021 04/16/16 06/04/16 0657  AST 29 17 30   ALT 17 13 18   ALKPHOS 96 124 91  BILITOT 0.6  --  0.5  PROT 7.4  --  7.4  ALBUMIN 2.5*  --  2.5*    Recent Labs  02/08/16 1021  06/03/16 2204  06/04/16 0657  06/05/16 0540  06/06/16 0523   06/07/16 0848 06/12/16 06/13/16  WBC 23.4*  < > 35.0*  < > 42.4*  < > 31.9*  < > 25.8*  < > 16.8* 16.9 16.9  16.9  NEUTROABS 17.6*  --  31.8*  --  37.4*  --   --   --   --   --   --   --   --   HGB 15.5*  < > 14.8  < > 12.9  < > 12.7  < > 11.8*  --  10.9* 12.1 12.1  12.1  HCT 45.9  < > 45.5  < > 39.5  < > 38.7  < > 35.2*  --  32.1* 37 37  37  MCV 95.4  < > 94.0  --  93.6  --  92.4  --  91.0  --  89.4  --   --   PLT 160  < > 297  < > 275  < > 252  < > 205  --  181 399 399  399  < > = values in this interval not displayed. No results for input(s): CHOL, LDLCALC, TRIG in the last 8760 hours.  Invalid input(s): HCL No results found for: [facility] Lab Results  Component Value Date   TSH 3.04 12/27/2014   No results found for: HGBA1C Lab Results  Component Value Date   CHOL 117 07/27/2014   HDL 22 (A) 07/27/2014   LDLCALC 72 07/27/2014   TRIG 116 07/27/2014  Significant Diagnostic Results in last 30 days:  No results found.  Assessment and Plan  Diastolic heart failure, NYHA class 2 No exacerbations,pt has been stable; plan to cont metoprolol 12.5 mg TID and lasix 20 mg BID  Paroxysmal a-fib (HCC) Stable in NSR metoprolol 12.5 mg TID;plan to cont current meds  Hypothyroidism No change; TSH is 3.04, well controlled;plan to cont synthroid 100 mcg daily per tube    Webb Silversmith D. Sheppard Coil, MD

## 2016-08-26 ENCOUNTER — Encounter: Payer: Self-pay | Admitting: Internal Medicine

## 2016-08-26 NOTE — Assessment & Plan Note (Signed)
No change; TSH is 3.04, well controlled;plan to cont synthroid 100 mcg daily per tube

## 2016-08-26 NOTE — Assessment & Plan Note (Signed)
No exacerbations,pt has been stable; plan to cont metoprolol 12.5 mg TID and lasix 20 mg BID

## 2016-08-26 NOTE — Assessment & Plan Note (Signed)
Stable in NSR metoprolol 12.5 mg TID;plan to cont current meds

## 2016-09-08 ENCOUNTER — Emergency Department (HOSPITAL_COMMUNITY): Payer: Medicare Other

## 2016-09-08 ENCOUNTER — Inpatient Hospital Stay (HOSPITAL_COMMUNITY): Payer: Medicare Other

## 2016-09-08 ENCOUNTER — Inpatient Hospital Stay (HOSPITAL_COMMUNITY)
Admission: EM | Admit: 2016-09-08 | Discharge: 2016-09-18 | DRG: 871 | Disposition: A | Discharge status: Skilled Nursing Facility | Payer: Medicare Other | Attending: Internal Medicine | Admitting: Internal Medicine

## 2016-09-08 DIAGNOSIS — Z9181 History of falling: Secondary | ICD-10-CM

## 2016-09-08 DIAGNOSIS — I2510 Atherosclerotic heart disease of native coronary artery without angina pectoris: Secondary | ICD-10-CM | POA: Diagnosis present

## 2016-09-08 DIAGNOSIS — J9811 Atelectasis: Secondary | ICD-10-CM | POA: Diagnosis present

## 2016-09-08 DIAGNOSIS — F0280 Dementia in other diseases classified elsewhere without behavioral disturbance: Secondary | ICD-10-CM | POA: Diagnosis not present

## 2016-09-08 DIAGNOSIS — Z931 Gastrostomy status: Secondary | ICD-10-CM | POA: Diagnosis not present

## 2016-09-08 DIAGNOSIS — A419 Sepsis, unspecified organism: Secondary | ICD-10-CM

## 2016-09-08 DIAGNOSIS — F79 Unspecified intellectual disabilities: Secondary | ICD-10-CM | POA: Diagnosis present

## 2016-09-08 DIAGNOSIS — E875 Hyperkalemia: Secondary | ICD-10-CM | POA: Diagnosis present

## 2016-09-08 DIAGNOSIS — I480 Paroxysmal atrial fibrillation: Secondary | ICD-10-CM | POA: Diagnosis present

## 2016-09-08 DIAGNOSIS — E872 Acidosis: Secondary | ICD-10-CM | POA: Diagnosis present

## 2016-09-08 DIAGNOSIS — I509 Heart failure, unspecified: Secondary | ICD-10-CM | POA: Diagnosis present

## 2016-09-08 DIAGNOSIS — Z9189 Other specified personal risk factors, not elsewhere classified: Secondary | ICD-10-CM

## 2016-09-08 DIAGNOSIS — J810 Acute pulmonary edema: Secondary | ICD-10-CM | POA: Diagnosis present

## 2016-09-08 DIAGNOSIS — E038 Other specified hypothyroidism: Secondary | ICD-10-CM | POA: Diagnosis not present

## 2016-09-08 DIAGNOSIS — F0390 Unspecified dementia without behavioral disturbance: Secondary | ICD-10-CM | POA: Diagnosis present

## 2016-09-08 DIAGNOSIS — R6250 Unspecified lack of expected normal physiological development in childhood: Secondary | ICD-10-CM | POA: Diagnosis present

## 2016-09-08 DIAGNOSIS — R509 Fever, unspecified: Secondary | ICD-10-CM | POA: Diagnosis not present

## 2016-09-08 DIAGNOSIS — G8220 Paraplegia, unspecified: Secondary | ICD-10-CM | POA: Diagnosis present

## 2016-09-08 DIAGNOSIS — E039 Hypothyroidism, unspecified: Secondary | ICD-10-CM | POA: Diagnosis present

## 2016-09-08 DIAGNOSIS — R6520 Severe sepsis without septic shock: Secondary | ICD-10-CM | POA: Diagnosis not present

## 2016-09-08 DIAGNOSIS — E034 Atrophy of thyroid (acquired): Secondary | ICD-10-CM | POA: Diagnosis not present

## 2016-09-08 DIAGNOSIS — A4189 Other specified sepsis: Principal | ICD-10-CM | POA: Diagnosis present

## 2016-09-08 DIAGNOSIS — H4720 Unspecified optic atrophy: Secondary | ICD-10-CM | POA: Diagnosis present

## 2016-09-08 DIAGNOSIS — D72829 Elevated white blood cell count, unspecified: Secondary | ICD-10-CM | POA: Diagnosis present

## 2016-09-08 DIAGNOSIS — M810 Age-related osteoporosis without current pathological fracture: Secondary | ICD-10-CM | POA: Diagnosis present

## 2016-09-08 DIAGNOSIS — Z66 Do not resuscitate: Secondary | ICD-10-CM | POA: Diagnosis present

## 2016-09-08 DIAGNOSIS — J9601 Acute respiratory failure with hypoxia: Secondary | ICD-10-CM | POA: Diagnosis present

## 2016-09-08 DIAGNOSIS — R1310 Dysphagia, unspecified: Secondary | ICD-10-CM | POA: Diagnosis present

## 2016-09-08 DIAGNOSIS — R7881 Bacteremia: Secondary | ICD-10-CM | POA: Diagnosis not present

## 2016-09-08 DIAGNOSIS — G40909 Epilepsy, unspecified, not intractable, without status epilepticus: Secondary | ICD-10-CM | POA: Diagnosis present

## 2016-09-08 DIAGNOSIS — Z79899 Other long term (current) drug therapy: Secondary | ICD-10-CM

## 2016-09-08 DIAGNOSIS — H748X9 Other specified disorders of middle ear and mastoid, unspecified ear: Secondary | ICD-10-CM | POA: Diagnosis not present

## 2016-09-08 DIAGNOSIS — Z5181 Encounter for therapeutic drug level monitoring: Secondary | ICD-10-CM | POA: Diagnosis not present

## 2016-09-08 DIAGNOSIS — R569 Unspecified convulsions: Secondary | ICD-10-CM | POA: Diagnosis not present

## 2016-09-08 DIAGNOSIS — Z515 Encounter for palliative care: Secondary | ICD-10-CM | POA: Diagnosis present

## 2016-09-08 DIAGNOSIS — R6521 Severe sepsis with septic shock: Secondary | ICD-10-CM | POA: Diagnosis present

## 2016-09-08 DIAGNOSIS — D638 Anemia in other chronic diseases classified elsewhere: Secondary | ICD-10-CM | POA: Diagnosis present

## 2016-09-08 DIAGNOSIS — K219 Gastro-esophageal reflux disease without esophagitis: Secondary | ICD-10-CM | POA: Diagnosis present

## 2016-09-08 DIAGNOSIS — E876 Hypokalemia: Secondary | ICD-10-CM | POA: Diagnosis not present

## 2016-09-08 DIAGNOSIS — N179 Acute kidney failure, unspecified: Secondary | ICD-10-CM | POA: Diagnosis present

## 2016-09-08 DIAGNOSIS — I110 Hypertensive heart disease with heart failure: Secondary | ICD-10-CM | POA: Diagnosis present

## 2016-09-08 DIAGNOSIS — Z7401 Bed confinement status: Secondary | ICD-10-CM | POA: Diagnosis not present

## 2016-09-08 DIAGNOSIS — A4901 Methicillin susceptible Staphylococcus aureus infection, unspecified site: Secondary | ICD-10-CM | POA: Diagnosis not present

## 2016-09-08 DIAGNOSIS — D72828 Other elevated white blood cell count: Secondary | ICD-10-CM | POA: Diagnosis not present

## 2016-09-08 DIAGNOSIS — G249 Dystonia, unspecified: Secondary | ICD-10-CM | POA: Diagnosis not present

## 2016-09-08 DIAGNOSIS — R6510 Systemic inflammatory response syndrome (SIRS) of non-infectious origin without acute organ dysfunction: Secondary | ICD-10-CM | POA: Diagnosis not present

## 2016-09-08 DIAGNOSIS — E7800 Pure hypercholesterolemia, unspecified: Secondary | ICD-10-CM | POA: Diagnosis present

## 2016-09-08 LAB — PROTIME-INR
INR: 1.31
Prothrombin Time: 16.4 seconds — ABNORMAL HIGH (ref 11.4–15.2)

## 2016-09-08 LAB — CBC WITH DIFFERENTIAL/PLATELET
BASOS ABS: 0 10*3/uL (ref 0.0–0.1)
BASOS PCT: 0 %
Eosinophils Absolute: 0 10*3/uL (ref 0.0–0.7)
Eosinophils Relative: 0 %
HCT: 41.3 % (ref 36.0–46.0)
HEMOGLOBIN: 14.1 g/dL (ref 12.0–15.0)
LYMPHS PCT: 18 %
Lymphs Abs: 5 10*3/uL — ABNORMAL HIGH (ref 0.7–4.0)
MCH: 34 pg (ref 26.0–34.0)
MCHC: 34.1 g/dL (ref 30.0–36.0)
MCV: 99.5 fL (ref 78.0–100.0)
MONOS PCT: 10 %
Monocytes Absolute: 2.8 10*3/uL — ABNORMAL HIGH (ref 0.1–1.0)
NEUTROS ABS: 20 10*3/uL — AB (ref 1.7–7.7)
Neutrophils Relative %: 72 %
Platelets: 433 10*3/uL — ABNORMAL HIGH (ref 150–400)
RBC: 4.15 MIL/uL (ref 3.87–5.11)
RDW: 16 % — ABNORMAL HIGH (ref 11.5–15.5)
WBC: 27.8 10*3/uL — ABNORMAL HIGH (ref 4.0–10.5)

## 2016-09-08 LAB — URINALYSIS, ROUTINE W REFLEX MICROSCOPIC
Bilirubin Urine: NEGATIVE
Glucose, UA: NEGATIVE mg/dL
Ketones, ur: NEGATIVE mg/dL
Nitrite: NEGATIVE
Protein, ur: NEGATIVE mg/dL
SPECIFIC GRAVITY, URINE: 1.016 (ref 1.005–1.030)
pH: 5.5 (ref 5.0–8.0)

## 2016-09-08 LAB — URINE MICROSCOPIC-ADD ON

## 2016-09-08 LAB — I-STAT CG4 LACTIC ACID, ED
Lactic Acid, Venous: 8.3 mmol/L (ref 0.5–1.9)
Lactic Acid, Venous: 5.79 mmol/L (ref 0.5–1.9)

## 2016-09-08 LAB — COMPREHENSIVE METABOLIC PANEL
ALT: 54 U/L (ref 14–54)
AST: 161 U/L — AB (ref 15–41)
Albumin: 2.1 g/dL — ABNORMAL LOW (ref 3.5–5.0)
Alkaline Phosphatase: 347 U/L — ABNORMAL HIGH (ref 38–126)
Anion gap: 13 (ref 5–15)
BUN: 25 mg/dL — AB (ref 6–20)
CHLORIDE: 98 mmol/L — AB (ref 101–111)
CO2: 22 mmol/L (ref 22–32)
CREATININE: 0.87 mg/dL (ref 0.44–1.00)
Calcium: 8.2 mg/dL — ABNORMAL LOW (ref 8.9–10.3)
GFR calc Af Amer: >60 mL/min (ref >60)
GFR calc non Af Amer: >60 mL/min (ref >60)
Glucose, Bld: 115 mg/dL — ABNORMAL HIGH (ref 65–99)
Potassium: 5.5 mmol/L — ABNORMAL HIGH (ref 3.5–5.1)
SODIUM: 133 mmol/L — AB (ref 135–145)
Total Bilirubin: 2.1 mg/dL — ABNORMAL HIGH (ref 0.3–1.2)
Total Protein: 7.6 g/dL (ref 6.5–8.1)

## 2016-09-08 LAB — MRSA PCR SCREENING: MRSA BY PCR: NEGATIVE

## 2016-09-08 MED ORDER — ALBUTEROL SULFATE (2.5 MG/3ML) 0.083% IN NEBU: 2.5000 mg | INHALATION_SOLUTION | Freq: Four times a day (QID) | RESPIRATORY_TRACT | Status: DC | PRN

## 2016-09-08 MED ORDER — SODIUM CHLORIDE 0.9 % IV BOLUS (SEPSIS)
1000.0000 mL | Freq: Once | INTRAVENOUS | Status: AC
  Administered 2016-09-08: 1000 mL via INTRAVENOUS

## 2016-09-08 MED ORDER — VALPROATE SODIUM 250 MG/5ML PO SOLN
750.0000 mg | Freq: Three times a day (TID) | ORAL | Status: DC
  Administered 2016-09-08 – 2016-09-18 (×29): 750 mg
  Filled 2016-09-08 (×32): qty 15

## 2016-09-08 MED ORDER — VANCOMYCIN HCL IN DEXTROSE 1-5 GM/200ML-% IV SOLN
1000.0000 mg | INTRAVENOUS | Status: AC
Start: 2016-09-08 — End: 2016-09-08
  Administered 2016-09-08: 1000 mg via INTRAVENOUS
  Filled 2016-09-08: qty 200

## 2016-09-08 MED ORDER — SODIUM CHLORIDE 0.9 % IV BOLUS (SEPSIS)
250.0000 mL | Freq: Once | INTRAVENOUS | Status: AC
  Administered 2016-09-08: 250 mL via INTRAVENOUS

## 2016-09-08 MED ORDER — HEPARIN SODIUM (PORCINE) 5000 UNIT/ML IJ SOLN
5000.0000 [IU] | Freq: Three times a day (TID) | INTRAMUSCULAR | Status: DC
  Administered 2016-09-08 – 2016-09-10 (×5): 5000 [IU] via SUBCUTANEOUS
  Filled 2016-09-08 (×5): qty 1

## 2016-09-08 MED ORDER — METOPROLOL TARTRATE 12.5 MG HALF TABLET
12.5000 mg | ORAL_TABLET | Freq: Three times a day (TID) | ORAL | Status: DC
  Administered 2016-09-08 – 2016-09-09 (×4): 12.5 mg via ORAL
  Filled 2016-09-08 (×4): qty 1

## 2016-09-08 MED ORDER — ACETAMINOPHEN 650 MG RE SUPP: 650.0000 mg | Freq: Four times a day (QID) | RECTAL | Status: DC | PRN

## 2016-09-08 MED ORDER — PRO-STAT SUGAR FREE PO LIQD
30.0000 mL | Freq: Every day | ORAL | Status: DC
  Administered 2016-09-09 – 2016-09-18 (×10): 30 mL
  Filled 2016-09-08 (×10): qty 30

## 2016-09-08 MED ORDER — LEVOTHYROXINE SODIUM 100 MCG PO TABS
100.0000 ug | ORAL_TABLET | Freq: Every day | ORAL | Status: DC
  Administered 2016-09-09 – 2016-09-18 (×10): 100 ug via ORAL
  Filled 2016-09-08 (×10): qty 1

## 2016-09-08 MED ORDER — VANCOMYCIN HCL 500 MG IV SOLR
500.0000 mg | Freq: Two times a day (BID) | INTRAVENOUS | Status: DC
  Administered 2016-09-09: 500 mg via INTRAVENOUS
  Filled 2016-09-08 (×2): qty 500

## 2016-09-08 MED ORDER — CHLORHEXIDINE GLUCONATE 0.12 % MT SOLN
15.0000 mL | Freq: Two times a day (BID) | OROMUCOSAL | Status: DC
  Administered 2016-09-08 – 2016-09-18 (×20): 15 mL via OROMUCOSAL
  Filled 2016-09-08 (×17): qty 15

## 2016-09-08 MED ORDER — ACETAMINOPHEN 325 MG PO TABS
650.0000 mg | ORAL_TABLET | Freq: Four times a day (QID) | ORAL | Status: DC | PRN
  Administered 2016-09-18: 650 mg via ORAL
  Filled 2016-09-08: qty 2

## 2016-09-08 MED ORDER — SODIUM CHLORIDE 0.9 % IV SOLN: 250.0000 mL | INTRAVENOUS | Status: DC | PRN

## 2016-09-08 MED ORDER — ACETAMINOPHEN 650 MG RE SUPP
650.0000 mg | Freq: Four times a day (QID) | RECTAL | Status: DC | PRN
  Administered 2016-09-13 – 2016-09-18 (×10): 650 mg via RECTAL
  Filled 2016-09-08 (×11): qty 1

## 2016-09-08 MED ORDER — SODIUM CHLORIDE 0.9% FLUSH
3.0000 mL | Freq: Two times a day (BID) | INTRAVENOUS | Status: DC
  Administered 2016-09-08 – 2016-09-18 (×17): 3 mL via INTRAVENOUS

## 2016-09-08 MED ORDER — PIPERACILLIN-TAZOBACTAM 3.375 G IVPB 30 MIN
3.3750 g | Freq: Once | INTRAVENOUS | Status: AC
  Administered 2016-09-08: 3.375 g via INTRAVENOUS
  Filled 2016-09-08: qty 50

## 2016-09-08 MED ORDER — ORAL CARE MOUTH RINSE
15.0000 mL | Freq: Two times a day (BID) | OROMUCOSAL | Status: DC
  Administered 2016-09-09 – 2016-09-18 (×19): 15 mL via OROMUCOSAL

## 2016-09-08 MED ORDER — DOCUSATE SODIUM 50 MG/5ML PO LIQD
200.0000 mg | Freq: Two times a day (BID) | ORAL | Status: DC
Start: 2016-09-08 — End: 2016-09-18
  Administered 2016-09-08 – 2016-09-18 (×18): 200 mg
  Filled 2016-09-08 (×21): qty 20

## 2016-09-08 MED ORDER — CLONAZEPAM 1 MG PO TABS
1.0000 mg | ORAL_TABLET | Freq: Three times a day (TID) | ORAL | Status: DC
  Administered 2016-09-08 – 2016-09-18 (×29): 1 mg
  Filled 2016-09-08 (×29): qty 1

## 2016-09-08 MED ORDER — LEVETIRACETAM 100 MG/ML PO SOLN
500.0000 mg | Freq: Every day | ORAL | Status: DC
Start: 2016-09-09 — End: 2016-09-18
  Administered 2016-09-09 – 2016-09-18 (×10): 500 mg
  Filled 2016-09-08 (×10): qty 5

## 2016-09-08 MED ORDER — SODIUM CHLORIDE 0.9% FLUSH: 3.0000 mL | INTRAVENOUS | Status: DC | PRN

## 2016-09-08 MED ORDER — PIPERACILLIN-TAZOBACTAM 3.375 G IVPB
3.3750 g | Freq: Three times a day (TID) | INTRAVENOUS | Status: DC
  Administered 2016-09-08 – 2016-09-14 (×18): 3.375 g via INTRAVENOUS
  Filled 2016-09-08 (×17): qty 50

## 2016-09-08 MED ORDER — FREE WATER
100.0000 mL | Status: DC
  Administered 2016-09-08 – 2016-09-18 (×57): 100 mL

## 2016-09-08 NOTE — Progress Notes (Signed)
Called to let ED RN know that I'm ready to receive patient. RN said she is busy at the moment with another patient but will bring patient as soon as she can.

## 2016-09-08 NOTE — ED Notes (Signed)
[place]- Patient's legal guardian would like to be called regarding patient's stay status

## 2016-09-08 NOTE — ED Notes (Signed)
Mable Fill RN assist me with the in and out cath.

## 2016-09-08 NOTE — ED Notes (Signed)
[facility]ER REQUESTING PT BE STEPDOWN

## 2016-09-08 NOTE — ED Triage Notes (Signed)
Per [facility]- Pt resides at [facility]. DNR yellow copy. Pt is HX of CP- ( nonverbal however will open eyes and make eye contact). Today she is not meeting baseline upon arrival to room at 11:45. Rhonchi in all fields. Pt supine upon EMS arrival. Drooling with HX of aspiration pneumonia. VS recorded. Staff recorded BP 92/70.

## 2016-09-08 NOTE — ED Provider Notes (Signed)
[facility] DEPT Provider Note   CSN: [id number] Arrival date & time: 09/08/16  1233     History   Chief Complaint Chief Complaint  Patient presents with  . Altered Mental Status  . Code Sepsis  . Hyperventilating    HPI Dawn [name] is a 70 y.o. female.  HPI  well 5 caveat due to altered mental status   70 year old female with a history of mental retardation, dementia, seizure disorder, hypothyroidism, paroxysmal atrial fibrillation is brought to the emergency room from [facility]. I spoke with nursing staff who reports that she was found to be to This morning with an elevated temperature. There did not hear any adventitial lung sounds, oxygenation was within normal limits, blood pressure was normal. At baseline patient is usually able to track her eyes per EMS. Patient most recently discharged from the hospital on 06/07/2016 after aspiration pneumonia with sepsis.  Past Medical History:  Diagnosis Date  . Anemia   . Anginal pain ([place])   . Cataract   . Cataracts, bilateral   . CHF (congestive heart failure) ([place])   . Coronary artery disease   . Dementia   . Dental caries   . Dysphagia   . Esophageal reflux   . High cholesterol   . Hyperlipidemia   . Hypotension   . Hypothyroidism   . Optic atrophy   . Optic atrophy   . Osteoporosis   . 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 Paroxysmal atrial fibrillation (HCC)   . Seizures ([place])   . Sepsis ([place]) 01/2016  . Thyroid disease    hypothyroid  . Unspecified convulsions [facility])     Patient Active Problem List   Diagnosis Date Noted  . Persistent vegetative state ([place]) 06/09/2016  . Seizures ([place]) 02/15/2016  . PNA (pneumonia) 02/08/2016  . Severe mental retardation 01/10/2015  . Severe dementia 01/10/2015  . Senile debility 01/10/2015  . 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 Acute respiratory failure with hypoxia ([place]) 11/20/2014  . Generalized weakness 11/20/2014  . Dysphagia  11/20/2014  . Dementia without behavioral disturbance 11/20/2014  . Development delay 11/02/2014  . Acute respiratory distress 11/02/2014  . Paroxysmal ventricular tachycardia ([employer] [facility]) 10/30/2014  . HCAP (healthcare-associated pneumonia) 10/26/2014  . Hematoma of right parietal scalp 08/13/2014  . Edema 08/09/2014  . Hematoma 08/05/2014  . Fall at nursing home 08/05/2014  . Leukocytosis 07/26/2014  . Tachycardia 07/11/2014  . Leukocytopenia, unspecified 07/11/2014  . Pneumonia 06/17/2014  . Sepsis ([place]) 06/12/2014  . Facial droop 06/12/2014  . Hypernatremia 06/12/2014  . PEG (percutaneous endoscopic gastrostomy) status ([place]) 06/12/2014  . FUO (fever of unknown origin) 06/02/2014  . Paroxysmal a-fib ([place]) 05/14/2014  . Dysphasia 05/14/2014  . UTI (lower urinary tract infection) 05/14/2014  . Tooth decay 05/14/2014  . SIRS (systemic inflammatory response syndrome) ([place]) 12/15/2013  . Thrombocytopenia ([place]) 10/29/2013  . Hypotension 10/29/2013  . Anemia of chronic disease 10/29/2013  . Diastolic heart failure, NYHA class 2 ([place]) 10/29/2013  . Atrial fibrillation with rapid ventricular response ([place]) 10/28/2013  . Seizure disorder ([place]) 10/28/2013  . Hypothyroidism 10/28/2013  . HLD (hyperlipidemia) 10/28/2013  . Non-ST elevation myocardial infarction (NSTEMI) ([place]) 10/28/2013    Past Surgical History:  Procedure Laterality Date  . CENTRAL VENOUS CATHETER INSERTION  05/15/2014      . ESOPHAGOGASTRODUODENOSCOPY N/A 06/03/2014   Procedure: ESOPHAGOGASTRODUODENOSCOPY (EGD);  Surgeon: Beryle Beams, MD;  Location: [facility] ENDOSCOPY;  Service: Endoscopy;  Laterality: N/A;  . GASTROSTOMY  TUBE PLACEMENT    . midline incision      OB History    Gravida Para Term Preterm AB Living   0             SAB TAB Ectopic Multiple Live Births                   Home Medications    Prior to Admission medications   Medication Sig Start Date End Date Taking? Authorizing Provider  acetaminophen  (TYLENOL) 650 MG suppository Place 1 suppository (650 mg total) rectally every 6 (six) hours as needed for mild pain (or Fever >/= 101). 06/07/16  Yes Jessica U Vann, DO  Amino Acids-Protein Hydrolys (FEEDING SUPPLEMENT, PRO-STAT SUGAR FREE 64,) LIQD Place 30 mLs into feeding tube daily. 02/14/16  Yes Ripudeep Krystal Eaton, MD  clonazePAM (KLONOPIN) 1 MG tablet Place 1 mg into feeding tube every 8 (eight) hours.   Yes Historical Provider, MD  docusate (COLACE) 50 MG/5ML liquid Place 20 mLs (200 mg total) into feeding tube 2 (two) times daily. 06/09/14  Yes Bonnielee Haff, MD  famotidine (PEPCID) 20 MG tablet Give 1 tablet via peg tube daily ( 20 mg )    Yes Historical Provider, MD  furosemide (LASIX) 20 MG tablet 20 mg by PEG Tube route 2 (two) times daily.   Yes Historical Provider, MD  levETIRAcetam (KEPPRA) 100 MG/ML solution Place 5 mLs (500 mg total) into feeding tube daily at 6 (six) AM. 06/09/14  Yes Bonnielee Haff, MD  levothyroxine (SYNTHROID, LEVOTHROID) 100 MCG tablet 100 mcg by PEG Tube route daily before breakfast.   Yes Historical Provider, MD  metoprolol tartrate (LOPRESSOR) 25 MG tablet 12.5 mg by PEG Tube route 3 (three) times daily.   Yes Historical Provider, MD  Multiple Vitamins-Minerals (CENTAMIN) LIQD Place 5 mLs into feeding tube daily.   Yes Historical Provider, MD  Nutritional Supplements (NUTRITIONAL SUPPLEMENT PO) Two cal hn via peg at 39 cc/hr x 24 hours continuously   Yes Historical Provider, MD  potassium chloride 20 MEQ/15ML (10%) SOLN 20 mEq by PEG Tube route daily. Dilute with water before administered.   Yes Historical Provider, MD  Valproic Acid (DEPAKENE) 250 MG/5ML SYRP syrup Place 15 mLs (750 mg total) into feeding tube 3 (three) times daily. 06/09/14  Yes Bonnielee Haff, MD  Water For Irrigation, Sterile (FREE WATER) SOLN Place 100 mLs into feeding tube every 4 (four) hours. 06/07/16   Geradine Girt, DO    Family History Family History  Problem Relation Age of Onset  .  Colon cancer Mother   . Diabetes type II Sister     Social History Social History  Substance Use Topics  . Smoking status: Never Smoker  . Smokeless tobacco: Never Used  . Alcohol use No     Allergies   Carvedilol and Ppd [tuberculin purified protein derivative]   Review of Systems Review of Systems  All other systems reviewed and are negative.  Physical Exam Updated Vital Signs BP 119/70   Pulse 100   Temp 99.5 F (37.5 C) (Rectal)   Resp 12   Ht 5\' 2"  (1.575 m)   Wt 65.3 kg   SpO2 99%   BMI 26.34 kg/m   Physical Exam  Constitutional: She is oriented to person, place, and time.  HENT:  Head: Normocephalic and atraumatic.  Eyes: Conjunctivae are normal. Right eye exhibits no discharge. Left eye exhibits no discharge. No scleral icterus.  Neck: Normal range of motion.  No JVD present. No tracheal deviation present.  Cardiovascular: Regular rhythm.   Murmur heard. Pulmonary/Chest: Effort normal. No stridor.  Tachypnea  Abdominal: Soft.  Musculoskeletal: Normal range of motion. She exhibits no edema.  Neurological: She is alert and oriented to person, place, and time. Coordination normal.  Skin: Skin is warm.  No sores noted  Psychiatric: She has a normal mood and affect. Her behavior is normal. Judgment and thought content normal.  Nursing note and vitals reviewed.    ED Treatments / Results  Labs (all labs ordered are listed, but only abnormal results are displayed) Labs Reviewed  COMPREHENSIVE METABOLIC PANEL - Abnormal; Notable for the following:       Result Value   Sodium 133 (*)    Potassium 5.5 (*)    Chloride 98 (*)    Glucose, Bld 115 (*)    BUN 25 (*)    Calcium 8.2 (*)    Albumin 2.1 (*)    AST 161 (*)    Alkaline Phosphatase 347 (*)    Total Bilirubin 2.1 (*)    All other components within normal limits  CBC WITH DIFFERENTIAL/PLATELET - Abnormal; Notable for the following:    WBC 27.8 (*)    RDW 16.0 (*)    Platelets 433 (*)     Neutro Abs 20.0 (*)    Lymphs Abs 5.0 (*)    Monocytes Absolute 2.8 (*)    All other components within normal limits  URINALYSIS, ROUTINE W REFLEX MICROSCOPIC (NOT AT [facility]) - Abnormal; Notable for the following:    Hgb urine dipstick SMALL (*)    Leukocytes, UA TRACE (*)    All other components within normal limits  PROTIME-INR - Abnormal; Notable for the following:    Prothrombin Time 16.4 (*)    All other components within normal limits  URINE MICROSCOPIC-ADD ON - Abnormal; Notable for the following:    Squamous Epithelial / LPF 0-5 (*)    Bacteria, UA RARE (*)    All other components within normal limits  I-STAT CG4 LACTIC ACID, ED - Abnormal; Notable for the following:    Lactic Acid, Venous 8.30 (*)    All other components within normal limits  I-STAT CG4 LACTIC ACID, ED - Abnormal; Notable for the following:    Lactic Acid, Venous 5.79 (*)    All other components within normal limits  CULTURE, BLOOD (ROUTINE X 2)  CULTURE, BLOOD (ROUTINE X 2)  URINE CULTURE  I-STAT CG4 LACTIC ACID, ED    EKG  EKG Interpretation None       Radiology Dg Chest Portable 1 View  Result Date: 09/08/2016 CLINICAL DATA:  70 year old female with a history of sepsis EXAM: PORTABLE CHEST 1 VIEW COMPARISON:  06/03/2016, 02/08/2016, 11/11/2014 FINDINGS: Significant right rotation somewhat limits evaluation. Low lung volumes accentuating the interstitium. Interstitial opacities bilaterally. No pneumothorax identified.  No large pleural effusion. No large confluent airspace disease. IMPRESSION: Limited exam given the positioning with low lung volumes and likely atelectasis. No definite lobar pneumonia. Signed, Dulcy Fanny. Earleen Newport, DO [facility] Radiology Electronically Signed   By: Corrie Mckusick D.O.   On: 09/08/2016 13:31    Procedures Procedures (including critical care time)  CRITICAL CARE Performed by: Elmer Ramp   Total critical care  time: 35 minutes  Critical care time was exclusive of separately billable procedures and treating other patients.  Critical care was necessary to treat or prevent imminent or life-threatening deterioration.  Critical care  was time spent personally by me on the following activities: development of treatment plan with patient and/or surrogate as well as nursing, discussions with consultants, evaluation of patient's response to treatment, examination of patient, obtaining history from patient or surrogate, ordering and performing treatments and interventions, ordering and review of laboratory studies, ordering and review of radiographic studies, pulse oximetry and re-evaluation of patient's condition.  Medications Ordered in ED Medications  piperacillin-tazobactam (ZOSYN) IVPB 3.375 g (3.375 g Intravenous New Bag/Given 09/08/16 2027)  vancomycin (VANCOCIN) 500 mg in sodium chloride 0.9 % 100 mL IVPB (not administered)  sodium chloride 0.9 % bolus 1,000 mL (0 mLs Intravenous Stopped 09/08/16 1400)    And  sodium chloride 0.9 % bolus 1,000 mL (0 mLs Intravenous Stopped 09/08/16 1423)    And  sodium chloride 0.9 % bolus 250 mL (0 mLs Intravenous Stopped 09/08/16 1524)  piperacillin-tazobactam (ZOSYN) IVPB 3.375 g (0 g Intravenous Stopped 09/08/16 1415)  vancomycin (VANCOCIN) IVPB 1000 mg/200 mL premix (0 mg Intravenous Stopped 09/08/16 1453)     Initial Impression / Assessment and Plan / ED Course  I have reviewed the triage vital signs and the nursing notes.  Pertinent labs & imaging results that were available during my care of the patient were reviewed by me and considered in my medical decision making (see chart for details).  Clinical Course      Final Clinical Impressions(s) / ED Diagnoses   Final diagnoses:  Sepsis, due to unspecified organism ([place])  Sepsis ([place])    Labs:I-STAT lactic acid, urinalysis, PT/INR, CMP and CBC   Imaging: DG chest 2 view  Consults:  [facility]  Therapeutics: Vancomycin and Zosyn  Discharge Meds:   Assessment/Plan: 70 year old female presents today with sepsis. Uncertain etiology at this time. Patient started on sepsis protocol with weight-based fluid resuscitation, IV antibiotic. Hospital service was consulted for admission. Patient's vital signs improved while here in the ED.  After admission reassessment labs show elevation in LFTs, with no other source of infection question gallbladder source. Right upper quadrant ultrasound ordered, for coverage M.D. consult at 2 informed that I would be ordering the test the event patient makes it to the floor prior to resolution of the test.   New Prescriptions New Prescriptions   No medications on file     Okey Regal, PA-C 09/08/16 2123    Okey Regal, PA-C 09/08/16 2124    Duffy Bruce, MD 09/09/16 [phone]

## 2016-09-08 NOTE — ED Notes (Signed)
EDPA Provider at bedside. 

## 2016-09-08 NOTE — ED Notes (Signed)
BLOOD CULTURE X 1 5ML OBTAINED BY PATTI D RN LEFT HAND

## 2016-09-08 NOTE — ED Notes (Signed)
CODE SEPSIS- CHARGE PATTI RN AWARE

## 2016-09-08 NOTE — H&P (Signed)
History and Physical    Dawn [name] [id number] DOB: [birth year] DOA: 09/08/2016  PCP: Inocencio Homes, MD  Patient coming from: SNF  Chief Complaint: Fever  HPI: Dawn [name] is a 70 y.o. female with medical history significant of seizure disorder, dementia, A. fib, hypothyroidism who presents with fevers from nursing home. The patient is unable to provide history. History is obtained by EMR and ED physician. Otherwise patient is not able to provide any other history. Reportedly patient was discharged from the hospital 06/07/2016 after aspiration pneumonia  ED Course: While in the ED patient was found to have a fever as high as 103.4, lactic acidosis, and leukocytosis  Review of Systems: As per HPI otherwise 10 point review of systems negative.    Past Medical History:  Diagnosis Date  . Anemia   . Anginal pain ([place])   . Cataract   . Cataracts, bilateral   . CHF (congestive heart failure) ([place])   . Coronary artery disease   . Dementia   . Dental caries   . Dysphagia   . Esophageal reflux   . High cholesterol   . Hyperlipidemia   . Hypotension   . Hypothyroidism   . Optic atrophy   . Optic atrophy   . Osteoporosis   . 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 Paroxysmal atrial fibrillation (HCC)   . Seizures ([place])   . Sepsis ([place]) 01/2016  . Thyroid disease    hypothyroid  . Unspecified convulsions ([place])     Past Surgical History:  Procedure Laterality Date  . CENTRAL VENOUS CATHETER INSERTION  05/15/2014      . ESOPHAGOGASTRODUODENOSCOPY N/A 06/03/2014   Procedure: ESOPHAGOGASTRODUODENOSCOPY (EGD);  Surgeon: Beryle Beams, MD;  [facility];  Service: Endoscopy;  Laterality: N/A;  . GASTROSTOMY TUBE PLACEMENT    . midline incision       reports that she has never smoked. She has never used smokeless tobacco. She reports that she does not drink alcohol or use drugs.  Allergies  Allergen Reactions  . Carvedilol   . Ppd [Tuberculin Purified Protein  Derivative]     Per MAR    Family History  Problem Relation Age of Onset  . Colon cancer Mother   . Diabetes type II Sister     Prior to Admission medications   Medication Sig Start Date End Date Taking? Authorizing Provider  acetaminophen (TYLENOL) 650 MG suppository Place 1 suppository (650 mg total) rectally every 6 (six) hours as needed for mild pain (or Fever >/= 101). 06/07/16  Yes Jessica U Vann, DO  Amino Acids-Protein Hydrolys (FEEDING SUPPLEMENT, PRO-STAT SUGAR FREE 64,) LIQD Place 30 mLs into feeding tube daily. 02/14/16  Yes Ripudeep Krystal Eaton, MD  clonazePAM (KLONOPIN) 1 MG tablet Place 1 mg into feeding tube every 8 (eight) hours.   Yes Historical Provider, MD  docusate (COLACE) 50 MG/5ML liquid Place 20 mLs (200 mg total) into feeding tube 2 (two) times daily. 06/09/14  Yes Bonnielee Haff, MD  famotidine (PEPCID) 20 MG tablet Give 1 tablet via peg tube daily ( 20 mg )    Yes Historical Provider, MD  furosemide (LASIX) 20 MG tablet 20 mg by PEG Tube route 2 (two) times daily.   Yes Historical Provider, MD  levETIRAcetam (KEPPRA) 100 MG/ML solution Place 5 mLs (500 mg total) into feeding tube daily at 6 (six) AM. 06/09/14  Yes Bonnielee Haff, MD  levothyroxine (SYNTHROID, LEVOTHROID) 100 MCG tablet 100 mcg by PEG Tube  route daily before breakfast.   Yes Historical Provider, MD  metoprolol tartrate (LOPRESSOR) 25 MG tablet 12.5 mg by PEG Tube route 3 (three) times daily.   Yes Historical Provider, MD  Multiple Vitamins-Minerals (CENTAMIN) LIQD Place 5 mLs into feeding tube daily.   Yes Historical Provider, MD  Nutritional Supplements (NUTRITIONAL SUPPLEMENT PO) Two cal hn via peg at 39 cc/hr x 24 hours continuously   Yes Historical Provider, MD  potassium chloride 20 MEQ/15ML (10%) SOLN 20 mEq by PEG Tube route daily. Dilute with water before administered.   Yes Historical Provider, MD  Valproic Acid (DEPAKENE) 250 MG/5ML SYRP syrup Place 15 mLs (750 mg total) into feeding tube 3 (three)  times daily. 06/09/14  Yes Bonnielee Haff, MD  Water For Irrigation, Sterile (FREE WATER) SOLN Place 100 mLs into feeding tube every 4 (four) hours. 06/07/16   Geradine Girt, DO    Physical Exam: Vitals:   09/08/16 1445 09/08/16 1500 09/08/16 1515 09/08/16 1515  BP: 122/80 125/70 117/63 113/68  Pulse: 98 99 98 98  Resp: (!) 30 21 (!) 33 23  Temp:      TempSrc:      SpO2: 99% 100% 99% 99%  Weight:      Height:          Constitutional: NAD, calm, comfortable Vitals:   09/08/16 1445 09/08/16 1500 09/08/16 1515 09/08/16 1515  BP: 122/80 125/70 117/63 113/68  Pulse: 98 99 98 98  Resp: (!) 30 21 (!) 33 23  Temp:      TempSrc:      SpO2: 99% 100% 99% 99%  Weight:      Height:       Eyes: PERRL, lids and conjunctivae normal ENMT: Mucous membranes are dry. Posterior pharynx clear of any exudate or lesions. Poor dental hygeine Neck: normal, supple, no masses, no thyromegaly Respiratory: no wheezes, equal chest rise, no rhales Cardiovascular: Regular rate and rhythm, no murmurs / rubs / gallops.  Abdomen: no tenderness, no masses palpated. No hepatosplenomegaly. Bowel sounds positive.  Musculoskeletal: no clubbing / cyanosis. No joint deformity upper and lower extremities.   Skin: no rashes, lesions, ulcers. No induration Neurologic: Unable to assess completely secondary to limited patient cooperation Psychiatric: Unable to assess completely secondary to limited patient cooperation   Labs on Admission: I have personally reviewed following labs and imaging studies  CBC:  Recent Labs Lab 09/08/16 1309  WBC 27.8*  NEUTROABS 20.0*  HGB 14.1  HCT 41.3  MCV 99.5  PLT [id number]*   Basic Metabolic Panel:  Recent Labs Lab 09/08/16 1309  NA 133*  K 5.5*  CL 98*  CO2 22  GLUCOSE 115*  BUN 25*  CREATININE 0.87  CALCIUM 8.2*   GFR: Estimated Creatinine Clearance: 53.4 mL/min (by C-G formula based on SCr of 0.87 mg/dL). Liver Function Tests:  Recent Labs Lab 09/08/16 1309   AST 161*  ALT 54  ALKPHOS 347*  BILITOT 2.1*  PROT 7.6  ALBUMIN 2.1*   No results for input(s): LIPASE, AMYLASE in the last 168 hours. No results for input(s): AMMONIA in the last 168 hours. Coagulation Profile:  Recent Labs Lab 09/08/16 1450  INR 1.31   Cardiac Enzymes: No results for input(s): CKTOTAL, CKMB, CKMBINDEX, TROPONINI in the last 168 hours. BNP (last 3 results) No results for input(s): PROBNP in the last 8760 hours. HbA1C: No results for input(s): HGBA1C in the last 72 hours. CBG: No results for input(s): GLUCAP in the last 168 hours.  Lipid Profile: No results for input(s): CHOL, HDL, LDLCALC, TRIG, CHOLHDL, LDLDIRECT in the last 72 hours. Thyroid Function Tests: No results for input(s): TSH, T4TOTAL, FREET4, T3FREE, THYROIDAB in the last 72 hours. Anemia Panel: No results for input(s): VITAMINB12, FOLATE, FERRITIN, TIBC, IRON, RETICCTPCT in the last 72 hours. Urine analysis:    Component Value Date/Time   COLORURINE YELLOW 09/08/2016 1500   APPEARANCEUR CLEAR 09/08/2016 1500   LABSPEC 1.016 09/08/2016 1500   PHURINE 5.5 09/08/2016 1500   GLUCOSEU NEGATIVE 09/08/2016 1500   HGBUR SMALL (A) 09/08/2016 1500   BILIRUBINUR NEGATIVE 09/08/2016 1500   KETONESUR NEGATIVE 09/08/2016 1500   PROTEINUR NEGATIVE 09/08/2016 1500   UROBILINOGEN 1.0 11/02/2014 0924   NITRITE NEGATIVE 09/08/2016 1500   LEUKOCYTESUR TRACE (A) 09/08/2016 1500   Sepsis Labs: !!!!!!!!!!!!!!!!!!!!!!!!!!!!!!!!!!!!!!!!!!!! @LABRCNTIP (procalcitonin:4,lacticidven:4) )No results found for this or any previous visit (from the past 240 hour(s)).   Radiological Exams on Admission: Dg Chest Portable 1 View  Result Date: 09/08/2016 CLINICAL DATA:  70 year old female with a history of sepsis EXAM: PORTABLE CHEST 1 VIEW COMPARISON:  06/03/2016, 02/08/2016, 11/11/2014 FINDINGS: Significant right rotation somewhat limits evaluation. Low lung volumes accentuating the interstitium. Interstitial  opacities bilaterally. No pneumothorax identified.  No large pleural effusion. No large confluent airspace disease. IMPRESSION: Limited exam given the positioning with low lung volumes and likely atelectasis. No definite lobar pneumonia. Signed, Dulcy Fanny. Earleen Newport, DO [facility] Radiology Electronically Signed   By: Corrie Mckusick D.O.   On: 09/08/2016 13:31    EKG: Independently reviewed. Sinus tachycardia with no ST elevations or depressions  Assessment/Plan Active Problems:   SIRS (systemic inflammatory response syndrome) (HCC) - Source of infection not found but suspect it may be pulmonary. Will cover with vancomycin and Zosyn for now. Patient has history of seizures and may have had a seizure with aspiration. Although the suspicion has not been confirmed. Urine culture is not reassuring as source of infection  Hypothyroidism - stable continue home synthroid regimen  HLD - Pt on statin  Atrial fibrillation - Currently in Sinus - Will continue B blocker - Suspect patient is not on anticoagulation secondary to increased risk of falls given history of seizure disorder  GERD - stable  Seizure d/o - stable continue home medication regimen   DVT prophylaxis: Heparin Code Status: full Family Communication: None at bedside Disposition Plan: Inpatient Consults called: None Admission status: Alinda Sierras MD [facility] Pager [phone]  If 7PM-7AM, please contact night-coverage [email or web address] Password [email or web address]  09/08/2016, 4:50 PM

## 2016-09-08 NOTE — Progress Notes (Signed)
Pharmacy Antibiotic Note  Dawn [name] is a 70 y.o. female admitted on 09/08/2016 with sepsis.  Pharmacy has been consulted for vancomycin/Zosyn dosing.  Pt  with a history of mental retardation, dementia, seizure disorder, hypothyroidism, paroxysmal atrial fibrillation is brought to the emergency room from living facility after being found down. At baseline patient is usually able to track her eyes per EMS, nursing staff found her down in the supine position, drooling, unable to contract her eyes or respond appropriately.  Patient most recently discharged from the hospital on 06/07/2016 after aspiration pneumonia with sepsis.  Plan:  Zosyn 3.375g IV q8h (4 hour infusion).   Vancomycin 1000 mg IV x1 then 500 mg IV q12h   F/u clinical course, renal function, culture data as available   Height: 5\' 2"  (157.5 cm) Weight: 144 lb (65.3 kg) IBW/kg (Calculated) : 50.1  Temp (24hrs), Avg:103.4 F (39.7 C), Min:103.4 F (39.7 C), Max:103.4 F (39.7 C)   Recent Labs Lab 09/08/16 1309 09/08/16 1317  WBC 27.8*  --   CREATININE 0.87  --   LATICACIDVEN  --  8.30*    Estimated Creatinine Clearance: 53.4 mL/min (by C-G formula based on SCr of 0.87 mg/dL).    Allergies  Allergen Reactions  . Carvedilol   . Ppd [Tuberculin Purified Protein Derivative]     Per MAR    Antimicrobials this admission: Vancomycin 11/25 >>  Zosyn 11/25 >>   Dose adjustments this admission: ----  Microbiology results: 11/25 BCx: sent 11/25 UCx: sent   Thank you for allowing pharmacy to be a part of this patient's care.   Royetta Asal, PharmD, BCPS Pager [phone] 09/08/2016 2:46 PM

## 2016-09-08 NOTE — ED Notes (Signed)
Spoke with Agricultural consultant in [place] for patient

## 2016-09-08 NOTE — ED Notes (Signed)
BLOOD CULTURE X 2 OBTAINED 5 ML RT AC BY JARRITA RN

## 2016-09-08 NOTE — ED Notes (Signed)
Spoke with ICU Charge Nurse, Burt Knack, RN she will give me a call back when she is able to take the patient.

## 2016-09-08 NOTE — ED Notes (Signed)
EDP ALLEN MADE AWARE OF PT CURRENT STATUS

## 2016-09-08 NOTE — ED Notes (Signed)
EDPA Provider at bedside.Updating family

## 2016-09-08 NOTE — ED Notes (Signed)
Delay in transporting due to other patient care.

## 2016-09-09 ENCOUNTER — Inpatient Hospital Stay (HOSPITAL_COMMUNITY): Payer: Medicare Other

## 2016-09-09 DIAGNOSIS — E872 Acidosis: Secondary | ICD-10-CM

## 2016-09-09 DIAGNOSIS — R7881 Bacteremia: Secondary | ICD-10-CM

## 2016-09-09 LAB — BASIC METABOLIC PANEL
ANION GAP: 8 (ref 5–15)
BUN: 22 mg/dL — ABNORMAL HIGH (ref 6–20)
CALCIUM: 8.1 mg/dL — AB (ref 8.9–10.3)
CO2: 25 mmol/L (ref 22–32)
Chloride: 103 mmol/L (ref 101–111)
Creatinine, Ser: 0.72 mg/dL (ref 0.44–1.00)
Glucose, Bld: 93 mg/dL (ref 65–99)
Potassium: 4 mmol/L (ref 3.5–5.1)
SODIUM: 136 mmol/L (ref 135–145)

## 2016-09-09 LAB — CBC
HCT: 36.5 % (ref 36.0–46.0)
HEMOGLOBIN: 12.1 g/dL (ref 12.0–15.0)
MCH: 33 pg (ref 26.0–34.0)
MCHC: 33.2 g/dL (ref 30.0–36.0)
MCV: 99.5 fL (ref 78.0–100.0)
Platelets: 332 10*3/uL (ref 150–400)
RBC: 3.67 MIL/uL — AB (ref 3.87–5.11)
RDW: 15.6 % — ABNORMAL HIGH (ref 11.5–15.5)
WBC: 27.4 10*3/uL — AB (ref 4.0–10.5)

## 2016-09-09 LAB — BLOOD CULTURE ID PANEL (REFLEXED)
ACINETOBACTER BAUMANNII: NOT DETECTED
CANDIDA GLABRATA: NOT DETECTED
CANDIDA KRUSEI: NOT DETECTED
Candida albicans: NOT DETECTED
Candida parapsilosis: NOT DETECTED
Candida tropicalis: NOT DETECTED
ENTEROBACTER CLOACAE COMPLEX: NOT DETECTED
ENTEROCOCCUS SPECIES: NOT DETECTED
ESCHERICHIA COLI: NOT DETECTED
Enterobacteriaceae species: NOT DETECTED
Haemophilus influenzae: NOT DETECTED
Klebsiella oxytoca: NOT DETECTED
Klebsiella pneumoniae: NOT DETECTED
LISTERIA MONOCYTOGENES: NOT DETECTED
Methicillin resistance: NOT DETECTED
NEISSERIA MENINGITIDIS: NOT DETECTED
PSEUDOMONAS AERUGINOSA: NOT DETECTED
Proteus species: NOT DETECTED
STAPHYLOCOCCUS AUREUS BCID: NOT DETECTED
STREPTOCOCCUS AGALACTIAE: NOT DETECTED
STREPTOCOCCUS PNEUMONIAE: NOT DETECTED
STREPTOCOCCUS SPECIES: NOT DETECTED
Serratia marcescens: NOT DETECTED
Staphylococcus species: DETECTED — AB
Streptococcus pyogenes: NOT DETECTED

## 2016-09-09 LAB — URINE CULTURE: Culture: NO GROWTH

## 2016-09-09 MED ORDER — VANCOMYCIN HCL 500 MG IV SOLR
500.0000 mg | Freq: Two times a day (BID) | INTRAVENOUS | Status: DC
  Administered 2016-09-09 – 2016-09-10 (×3): 500 mg via INTRAVENOUS
  Filled 2016-09-09 (×4): qty 500

## 2016-09-09 MED ORDER — SODIUM CHLORIDE 0.9 % IV SOLN
INTRAVENOUS | Status: DC
  Administered 2016-09-09 – 2016-09-12 (×6): via INTRAVENOUS

## 2016-09-09 MED ORDER — RANITIDINE HCL 150 MG/10ML PO SYRP
150.0000 mg | ORAL_SOLUTION | Freq: Every day | ORAL | Status: DC
  Administered 2016-09-09 – 2016-09-18 (×9): 150 mg
  Filled 2016-09-09 (×11): qty 10

## 2016-09-09 MED ORDER — TWOCAL HN PO LIQD
1000.0000 mL | ORAL | Status: DC
  Administered 2016-09-09 – 2016-09-18 (×11): 1000 mL
  Filled 2016-09-09 (×12): qty 1185

## 2016-09-09 NOTE — Progress Notes (Signed)
PHARMACY - PHYSICIAN COMMUNICATION CRITICAL VALUE ALERT - BLOOD CULTURE IDENTIFICATION (BCID)  Results for orders placed or performed during the hospital encounter of 09/08/16  Blood Culture ID Panel (Reflexed) (Collected: 09/08/2016  1:09 PM)  Result Value Ref Range   Enterococcus species NOT DETECTED NOT DETECTED   Listeria monocytogenes NOT DETECTED NOT DETECTED   Staphylococcus species DETECTED (A) NOT DETECTED   Staphylococcus aureus NOT DETECTED NOT DETECTED   Methicillin resistance NOT DETECTED NOT DETECTED   Streptococcus species NOT DETECTED NOT DETECTED   Streptococcus agalactiae NOT DETECTED NOT DETECTED   Streptococcus pneumoniae NOT DETECTED NOT DETECTED   Streptococcus pyogenes NOT DETECTED NOT DETECTED   Acinetobacter baumannii NOT DETECTED NOT DETECTED   Enterobacteriaceae species NOT DETECTED NOT DETECTED   Enterobacter cloacae complex NOT DETECTED NOT DETECTED   Escherichia coli NOT DETECTED NOT DETECTED   Klebsiella oxytoca NOT DETECTED NOT DETECTED   Klebsiella pneumoniae NOT DETECTED NOT DETECTED   Proteus species NOT DETECTED NOT DETECTED   Serratia marcescens NOT DETECTED NOT DETECTED   Haemophilus influenzae NOT DETECTED NOT DETECTED   Neisseria meningitidis NOT DETECTED NOT DETECTED   Pseudomonas aeruginosa NOT DETECTED NOT DETECTED   Candida albicans NOT DETECTED NOT DETECTED   Candida glabrata NOT DETECTED NOT DETECTED   Candida krusei NOT DETECTED NOT DETECTED   Candida parapsilosis NOT DETECTED NOT DETECTED   Candida tropicalis NOT DETECTED NOT DETECTED    Name of physician (or Provider) ContactedCharlies Silvers Changes to prescribed antibiotics required: none at this time- anticipate contaminant.  Cont Zosyn for PNA.    Biagio Borg 09/09/2016  1:16 PM

## 2016-09-09 NOTE — Progress Notes (Signed)
Initial Nutrition Assessment  DOCUMENTATION CODES:   Not applicable  INTERVENTION:  Provide TwoCalHN @ 39 ml/hr via PEG tube + Pro-Stat 30 ml daily. This regimen provides 1972 kcal, 95 grams protein, 664 ml H2O daily.  Continue free water flushes as ordered of 100 ml Q4hrs to provide an additional 600 ml H2O daily.  NUTRITION DIAGNOSIS:   Swallowing difficulty related to dysphagia, lethargy/confusion as evidenced by other (see comment) (long-term PEG tube use to meet kcal/protein needs).  GOAL:   Patient will meet greater than or equal to 90% of their needs  MONITOR:   Labs, Weight trends, TF tolerance, Skin, I & O's  REASON FOR ASSESSMENT:   Consult Enteral/tube feeding initiation and management (Continue TF Regimen)  ASSESSMENT:   70 y.o. female with medical history significant of seizure disorder, dementia, A. fib, hypothyroidism who presents with fevers from nursing home. The patient is unable to provide history. History is obtained by EMR and ED physician. Found to have SIRS with unknown source of infection (pulmonary suspected).    Noted in chart that patient is nonverbal so was unable to obtain history at bedside. No family present. Per chart patient is from [facility] and typically receives TwoCalHN via PEG @ 39 ml/hr x 24 hrs + Pro-Stat 30 ml daily. Patient receives free water flushes of 100 ml Q4hrs, which are already ordered here. Patient's weight fluctuates in chart between 62.5 and 67.7 kg excluding outliers. Will use first admission weight of 65.3 kg to estimate needs as patient has received >2.5 L of fluid since admission.  Access: PEG tube - current PEG placed 04/12/2016 per chart  Medications reviewed and include: Colace, Free Water 100 ml Q4hrs, levothyroxine, Zosyn, Valproate sodium, vancomycin, NS @ 50 ml/hr.  Labs reviewed: BUN 22, Lactic Acid 5.79.   Nutrition-Focused physical exam completed. Findings are no fat depletion, no muscle depletion. Unable to  assess patient's back (thoracic/lumbar region and scapular bone region) or edema.   Discussed with RN. Also spoke with Pharmacy. There are approximately seven 1 L bottles of 2CalHN in stock - patient will require 1 bottle daily.   Diet Order:  Diet Heart Room service appropriate? Yes; Fluid consistency: Thin  Skin:  Reviewed, no issues  Last BM:  09/09/2016  Height:   Ht Readings from Last 1 Encounters:  09/08/16 5' (1.524 m)    Weight:   Wt Readings from Last 1 Encounters:  09/08/16 149 lb 4 oz (67.7 kg)    Ideal Body Weight:  45.45 kg  BMI:  Body mass index is 29.15 kg/m.  Estimated Nutritional Needs:   Kcal:  1800-2000 (~27-30 kcal/kg)  Protein:  85-100 grams (1.3-1.5 grams/kg)  Fluid:  >/= 1.6 L/day (25 ml/kg)  EDUCATION NEEDS:   No education needs identified at this time  Willey Blade, MS, RD, LDN Pager: [phone] After Hours Pager: [phone]

## 2016-09-09 NOTE — Progress Notes (Addendum)
Patient ID: Dawn [name], female   DOB: 10-01-1946, 70 y.o.   MRN: [id number]  PROGRESS NOTE    Dawn [name]  WJX:[id number] DOB: [birth year] DOA: 09/08/2016  PCP: Inocencio Homes, MD   Brief Narrative:   70 y.o. female with past medical history significant for developmental delay, seizure disorder, dementia, A. fib, hypothyroidism who presented from SNF with fevers. She was hospitalized for aspiration pneumonia in 05/2016. In ED, BP was 94/62, HR 111, RR 11-48, T max 103.23F, O2 sat 93%. Her blood work showed WBC count 27.8, lactic acid 8.3, potassium 5.5 (repeat lab WNL), normal Cr. She was started on vanco and zosyn and admitted to SDU.   Assessment & Plan:   Active Problems: Sepsis ([place]) / Gram positive cocci bacteremia / Leukocytosis / Lactic acidosis  - Sepsis criteria met on admission with fever, tachycardia, tachypnea, hypotension, leukocytosis, lactic acidosis - Source seems to be bacteremia, blood cx on admission with gram pos cocci in clusters - Follow up final Blood cx report - Follow up lactic acid, trending down - Monitor in SDU  Seizure disorder - Continue Keppra, Depakote, Clonazepam - Monitor for seizures   Hypothyroidism - Continue synthroid  Essential hypertension - Continue metoprolol 12.5 mg TID    DVT prophylaxis: heparin subQ Code Status: DNR/DNI Family Communication: no family at the bedside this am Disposition Plan: remains in SDU due to ongoing sepsis, elevated lactic acid    Consultants:   None   Procedures:  None   Antimicrobials:   Vanco and Zosyn 09/08/2016 -->   Subjective: No overnight events.   Objective: Vitals:   09/09/16 1255 09/09/16 1600 09/09/16 1927 09/09/16 2000  BP: (!) 102/53 (!) 95/56  110/72  Pulse: (!) 102 (!) 101  (!) 102  Resp: (!) 33 (!) 22  (!) 40  Temp:  98.9 F (37.2 C) 98.5 F (36.9 C)   TempSrc:  Axillary Axillary   SpO2: 99% 92%  97%  Weight:      Height:        Intake/Output Summary  (Last 24 hours) at 09/09/16 2027 Last data filed at 09/09/16 1700  Gross per 24 hour  Intake          1843.83 ml  Output                0 ml  Net          1843.83 ml   Filed Weights   09/08/16 1315 09/08/16 2210  Weight: 65.3 kg (144 lb) 67.7 kg (149 lb 4 oz)    Examination:  General exam: Appears calm and comfortable  Respiratory system: Clear to auscultation. Respiratory effort normal. Cardiovascular system: S1 & S2 heard, Rate controlled  Gastrointestinal system: Abdomen is nondistended, soft and nontender. No organomegaly or masses felt. Normal bowel sounds heard. Central nervous system: Disoriented, minimally verbal, No focal neurological deficits. Extremities: Symmetric 5 x 5 power. Skin: No rashes, lesions or ulcers Psychiatry: Normal mood, not agitated, not restless.   Data Reviewed: I have personally reviewed following labs and imaging studies  CBC:  Recent Labs Lab 09/08/16 1309 09/09/16 0354  WBC 27.8* 27.4*  NEUTROABS 20.0*  --   HGB 14.1 12.1  HCT 41.3 36.5  MCV 99.5 99.5  PLT 433* 213   Basic Metabolic Panel:  Recent Labs Lab 09/08/16 1309 09/09/16 0354  NA 133* 136  K 5.5* 4.0  CL 98* 103  CO2 22 25  GLUCOSE 115* 93  BUN 25* 22*  CREATININE 0.87 0.72  CALCIUM 8.2* 8.1*   GFR: Estimated Creatinine Clearance: 56.2 mL/min (by C-G formula based on SCr of 0.72 mg/dL). Liver Function Tests:  Recent Labs Lab 09/08/16 1309  AST 161*  ALT 54  ALKPHOS 347*  BILITOT 2.1*  PROT 7.6  ALBUMIN 2.1*   No results for input(s): LIPASE, AMYLASE in the last 168 hours. No results for input(s): AMMONIA in the last 168 hours. Coagulation Profile:  Recent Labs Lab 09/08/16 1450  INR 1.31   Cardiac Enzymes: No results for input(s): CKTOTAL, CKMB, CKMBINDEX, TROPONINI in the last 168 hours. BNP (last 3 results) No results for input(s): PROBNP in the last 8760 hours. HbA1C: No results for input(s): HGBA1C in the last 72 hours. CBG: No results for  input(s): GLUCAP in the last 168 hours. Lipid Profile: No results for input(s): CHOL, HDL, LDLCALC, TRIG, CHOLHDL, LDLDIRECT in the last 72 hours. Thyroid Function Tests: No results for input(s): TSH, T4TOTAL, FREET4, T3FREE, THYROIDAB in the last 72 hours. Anemia Panel: No results for input(s): VITAMINB12, FOLATE, FERRITIN, TIBC, IRON, RETICCTPCT in the last 72 hours. Urine analysis:    Component Value Date/Time   COLORURINE YELLOW 09/08/2016 1500   APPEARANCEUR CLEAR 09/08/2016 1500   LABSPEC 1.016 09/08/2016 1500   PHURINE 5.5 09/08/2016 1500   GLUCOSEU NEGATIVE 09/08/2016 1500   HGBUR SMALL (A) 09/08/2016 1500   BILIRUBINUR NEGATIVE 09/08/2016 1500   KETONESUR NEGATIVE 09/08/2016 1500   PROTEINUR NEGATIVE 09/08/2016 1500   UROBILINOGEN 1.0 11/02/2014 0924   NITRITE NEGATIVE 09/08/2016 1500   LEUKOCYTESUR TRACE (A) 09/08/2016 1500   Sepsis Labs: '@LABRCNTIP' (procalcitonin:4,lacticidven:4)  Culture, blood (Routine x 2)     Status: None (Preliminary result)   Collection Time: 09/08/16  1:06 PM  Result Value Ref Range Status   Specimen Description BLOOD LEFT HAND  Final   Special Requests BOTTLES DRAWN AEROBIC AND ANAEROBIC 5CC  Final   Culture   Final    NO GROWTH < 24 HOURS Performed at [facility]    Report Status PENDING  Incomplete  Culture, blood (Routine x 2)     Status: None (Preliminary result)   Collection Time: 09/08/16  1:09 PM  Result Value Ref Range Status   Specimen Description BLOOD RIGHT ANTECUBITAL  Final   Special Requests BOTTLES DRAWN AEROBIC AND ANAEROBIC 5CC  Final   Culture  Setup Time   Final    GRAM POSITIVE COCCI IN CLUSTERS   Report Status PENDING  Incomplete  Blood Culture ID Panel (Reflexed)     Status: Abnormal   Collection Time: 09/08/16  1:09 PM  Result Value Ref Range Status   Enterococcus species NOT DETECTED NOT DETECTED Final   Listeria monocytogenes NOT DETECTED NOT DETECTED Final   Staphylococcus species DETECTED (A) NOT  DETECTED Final   Staphylococcus aureus NOT DETECTED NOT DETECTED Final   Methicillin resistance NOT DETECTED NOT DETECTED Final   Streptococcus species NOT DETECTED NOT DETECTED Final   Streptococcus agalactiae NOT DETECTED NOT DETECTED Final   Streptococcus pneumoniae NOT DETECTED NOT DETECTED Final   Streptococcus pyogenes NOT DETECTED NOT DETECTED Final   Acinetobacter baumannii NOT DETECTED NOT DETECTED Final   Enterobacteriaceae species NOT DETECTED NOT DETECTED Final   Enterobacter cloacae complex NOT DETECTED NOT DETECTED Final   Escherichia coli NOT DETECTED NOT DETECTED Final   Klebsiella oxytoca NOT DETECTED NOT DETECTED Final   Klebsiella pneumoniae NOT DETECTED NOT DETECTED Final   Proteus species NOT DETECTED NOT DETECTED Final  Serratia marcescens NOT DETECTED NOT DETECTED Final   Haemophilus influenzae NOT DETECTED NOT DETECTED Final   Neisseria meningitidis NOT DETECTED NOT DETECTED Final   Pseudomonas aeruginosa NOT DETECTED NOT DETECTED Final   Candida albicans NOT DETECTED NOT DETECTED Final   Candida glabrata NOT DETECTED NOT DETECTED Final   Candida krusei NOT DETECTED NOT DETECTED Final   Candida parapsilosis NOT DETECTED NOT DETECTED Final   Candida tropicalis NOT DETECTED NOT DETECTED Final    Comment: Performed at [facility]  Urine culture     Status: None   Collection Time: 09/08/16  3:00 PM  Result Value Ref Range Status   Specimen Description URINE, CATHETERIZED  Final   Special Requests NONE  Final   Culture NO GROWTH Performed at [facility]   Final   Report Status 09/09/2016 FINAL  Final  MRSA PCR Screening     Status: None   Collection Time: 09/08/16 10:21 PM  Result Value Ref Range Status   MRSA by PCR NEGATIVE NEGATIVE Final      Radiology Studies: Dg Chest Portable 1 View Result Date: 09/08/2016 Limited exam given the positioning with low lung volumes and likely atelectasis. No definite lobar pneumonia.   US Abdomen  Limited Ruq Result Date: 09/09/2016 Surgically absent gallbladder.  No biliary ductal dilatation. Hepatic steatosis.      Scheduled Meds: . clonazePAM  1 mg Per Tube Q8H  . docusate  200 mg Per Tube BID  . feeding supplement   30 mL Per Tube Daily  . free water  100 mL Per Tube Q4H  . heparin  5,000 Units Subcutaneous Q8H  . levETIRAcetam  500 mg Per Tube Q0600  . levothyroxine  100 mcg Oral QAC breakfast  . mouth rinse  15 mL Mouth Rinse q12n4p  . metoprolol tartrate  12.5 mg Oral TID  . piperacillin-tazobac  3.375 g Intravenous Q8H  . ranitidine  150 mg Per Tube QHS  . TWOCAL HN  1,000 mL Per Tube Q24H  . Valproate Sodium  750 mg Per Tube TID   Continuous Infusions: . sodium chloride 50 mL/hr at 09/09/16 1056     LOS: 1 day    Time spent: 25 minutes  Greater than 50% of the time spent on counseling and coordinating the care.   Leisa Lenz, MD [facility] Pager [phone]  If 7PM-7AM, please contact night-coverage [email or web address] Password [facility] 09/09/2016, 8:27 PM

## 2016-09-09 NOTE — Progress Notes (Signed)
Pharmacy Antibiotic Note  Dawn [name] is a 70 y.o. female admitted on 09/08/2016 with sepsis.  Pharmacy has been consulted for vancomycin/Zosyn dosing.  Pt  with a history of mental retardation, dementia, seizure disorder, hypothyroidism, paroxysmal atrial fibrillation is brought to the emergency room from living facility after being found down. At baseline patient is usually able to track her eyes per EMS, nursing staff found her down in the supine position, drooling, unable to contract her eyes or respond appropriately.  -vanc had been stopped today then pharmacy re-consulted to start this pm Plan:  Vancomycin  500 mg IV q12h   Continue zosyn 3.375mg  IV q4h EI  F/u clinical course, renal function, culture data as available   Height: 5' (152.4 cm) Weight: 149 lb 4 oz (67.7 kg) IBW/kg (Calculated) : 45.5  Temp (24hrs), Avg:98.8 F (37.1 C), Min:97.9 F (36.6 C), Max:100 F (37.8 C)   Recent Labs Lab 09/08/16 1309 09/08/16 1317 09/08/16 1641 09/09/16 0354  WBC 27.8*  --   --  27.4*  CREATININE 0.87  --   --  0.72  LATICACIDVEN  --  8.30* 5.79*  --     Estimated Creatinine Clearance: 56.2 mL/min (by C-G formula based on SCr of 0.72 mg/dL).    Allergies  Allergen Reactions  . Carvedilol   . Ppd [Tuberculin Purified Protein Derivative]     Per MAR    Antimicrobials this admission: Vancomycin 11/25 >>  Zosyn 11/25 >>   Dose adjustments this admission: ----  Microbiology results: 11/25 BCx: 1/2 GPC (BCID + staph species, no resistance) 11/25 UCx: sent  11/26 Resp Panel PCR: sent 11/25 MRSA PCR: negative  Thank you for allowing pharmacy to be a part of this patient's care.   Dolly Rias RPh 09/09/2016, 8:52 PM Pager [phone]

## 2016-09-10 ENCOUNTER — Encounter (HOSPITAL_COMMUNITY): Payer: Self-pay

## 2016-09-10 DIAGNOSIS — E038 Other specified hypothyroidism: Secondary | ICD-10-CM

## 2016-09-10 DIAGNOSIS — G40909 Epilepsy, unspecified, not intractable, without status epilepticus: Secondary | ICD-10-CM

## 2016-09-10 LAB — RESPIRATORY PANEL BY PCR
Adenovirus: NOT DETECTED
Bordetella pertussis: NOT DETECTED
CORONAVIRUS NL63-RVPPCR: NOT DETECTED
CORONAVIRUS OC43-RVPPCR: NOT DETECTED
Chlamydophila pneumoniae: NOT DETECTED
Coronavirus 229E: NOT DETECTED
Coronavirus HKU1: NOT DETECTED
INFLUENZA A-RVPPCR: NOT DETECTED
INFLUENZA B-RVPPCR: NOT DETECTED
METAPNEUMOVIRUS-RVPPCR: NOT DETECTED
Mycoplasma pneumoniae: NOT DETECTED
PARAINFLUENZA VIRUS 1-RVPPCR: NOT DETECTED
PARAINFLUENZA VIRUS 2-RVPPCR: NOT DETECTED
PARAINFLUENZA VIRUS 3-RVPPCR: NOT DETECTED
PARAINFLUENZA VIRUS 4-RVPPCR: NOT DETECTED
RESPIRATORY SYNCYTIAL VIRUS-RVPPCR: NOT DETECTED
Rhinovirus / Enterovirus: NOT DETECTED

## 2016-09-10 LAB — CBC
HEMATOCRIT: 31.3 % — AB (ref 36.0–46.0)
HEMOGLOBIN: 10.3 g/dL — AB (ref 12.0–15.0)
MCH: 33.3 pg (ref 26.0–34.0)
MCHC: 32.9 g/dL (ref 30.0–36.0)
MCV: 101.3 fL — AB (ref 78.0–100.0)
Platelets: 311 10*3/uL (ref 150–400)
RBC: 3.09 MIL/uL — AB (ref 3.87–5.11)
RDW: 15.9 % — AB (ref 11.5–15.5)
WBC: 23.7 10*3/uL — AB (ref 4.0–10.5)

## 2016-09-10 LAB — BASIC METABOLIC PANEL
ANION GAP: 10 (ref 5–15)
BUN: 20 mg/dL (ref 6–20)
CALCIUM: 8.2 mg/dL — AB (ref 8.9–10.3)
CHLORIDE: 104 mmol/L (ref 101–111)
CO2: 25 mmol/L (ref 22–32)
Creatinine, Ser: 0.68 mg/dL (ref 0.44–1.00)
GFR calc Af Amer: >60 mL/min (ref >60)
GFR calc non Af Amer: >60 mL/min (ref >60)
GLUCOSE: 161 mg/dL — AB (ref 65–99)
POTASSIUM: 3.7 mmol/L (ref 3.5–5.1)
Sodium: 139 mmol/L (ref 135–145)

## 2016-09-10 LAB — LACTIC ACID, PLASMA
LACTIC ACID, VENOUS: 5.5 mmol/L — AB (ref 0.5–1.9)
Lactic Acid, Venous: 5.5 mmol/L (ref 0.5–1.9)

## 2016-09-10 NOTE — Progress Notes (Signed)
CRITICAL VALUE ALERT  Critical value received:  Lactic 5.5  Date of notification:  09/10/17  Time of notification:  [id number]  Critical value read back:Yes.    Nurse who received alert:  Audri   MD notified (1st page):  Charlies Silvers  Time of first page:  1150  MD notified (2nd page):  Time of second page:  Responding MD:  Charlies Silvers   Time MD responded:  1200

## 2016-09-10 NOTE — Progress Notes (Addendum)
Patient ID: Dawn [name], female   DOB: [birth year], 70 y.o.   MRN: [id number]  PROGRESS NOTE    Dawn [name]  [id number] DOB: Feb 25, 1946 DOA: 09/08/2016  PCP: Inocencio Homes, MD   Brief Narrative:   69 y.o. female with past medical history significant for developmental delay, seizure disorder, dementia, A. fib, hypothyroidism who presented from SNF with fevers. She was hospitalized for aspiration pneumonia in 05/2016. In ED, BP was 94/62, HR 111, RR 11-48, T max 103.74F, O2 sat 93%. Her blood work showed WBC count 27.8, lactic acid 8.3, potassium 5.5 (repeat lab WNL), normal Cr. She was started on vanco and zosyn and admitted to SDU.   Assessment & Plan:   Active Problems: Sepsis ([place]) / Gram positive cocci bacteremia / Leukocytosis / Lactic acidosis  - Sepsis criteria met on admission with fever, tachycardia, tachypnea, hypotension, leukocytosis, lactic acidosis - Source of infection is bacteremia, blood cx on admission with gram pos cocci in clusters, staph species; final report is pending  - Urine cx showed no growth  - Follow up lactic acid still high at 5.5  - Hypotension this am likely from metoprolol which we will stop this am - Continue to monitor in SDU   Seizure disorder - Continue Keppra, Depakote, Clonazepam - Monitor for seizures   Hypothyroidism - Continue synthroid  Essential hypertension - Stop metoprolol due to hypotension   Dysphagia - Peg feeds     DVT prophylaxis: heparin subQ Code Status: DNR/DNI Family Communication: no family at the bedside this am Disposition Plan: remains in SDU due to ongoing sepsis, elevated lactic acid    Consultants:   None   Procedures:  None   Antimicrobials:   Vanco and Zosyn 09/08/2016 -->   Subjective: No overnight events.   Objective: Vitals:   09/10/16 0400 09/10/16 0750 09/10/16 0755 09/10/16 0800  BP: (!) 95/57  (!) 89/46   Pulse:   (!) 101   Resp: (!) 36  (!) 50   Temp:    (!)  100.9 F (38.3 C)  TempSrc:    Axillary  SpO2: 98% (!) 86% 95%   Weight:      Height:        Intake/Output Summary (Last 24 hours) at 09/10/16 1010 Last data filed at 09/10/16 0800  Gross per 24 hour  Intake          1603.33 ml  Output                0 ml  Net          1603.33 ml   Filed Weights   09/08/16 1315 09/08/16 2210  Weight: 65.3 kg (144 lb) 67.7 kg (149 lb 4 oz)    Examination:  General exam: Appears calm and comfortable, no distress   Respiratory system: no wheezing, no rhonchi  Cardiovascular system: S1 & S2 heard, RRR Gastrointestinal system: (+) BS, non tender, peg in place  Central nervous system: Disoriented, minimally verbal, No focal neurological deficits. Extremities: No tenderness, nos welling  Skin: warm, dry  Psychiatry: Normal mood and affect   Data Reviewed: I have personally reviewed following labs and imaging studies  CBC:  Recent Labs Lab 09/08/16 1309 09/09/16 0354 09/10/16 0340  WBC 27.8* 27.4* 23.7*  NEUTROABS 20.0*  --   --   HGB 14.1 12.1 10.3*  HCT 41.3 36.5 31.3*  MCV 99.5 99.5 101.3*  PLT 433* 332 992   Basic Metabolic Panel:  Recent Labs Lab 09/08/16  1309 09/09/16 0354 09/10/16 0340  NA 133* 136 139  K 5.5* 4.0 3.7  CL 98* 103 104  CO2 _0 GLUCOSE 115* 93 161*  BUN 25* 22* 20  CREATININE 0.87 0.72 0.68  CALCIUM 8.2* 8.1* 8.2*   GFR: Estimated Creatinine Clearance: 56.2 mL/min (by C-G formula based on SCr of 0.68 mg/dL). Liver Function Tests:  Recent Labs Lab 09/08/16 1309  AST 161*  ALT 54  ALKPHOS 347*  BILITOT 2.1*  PROT 7.6  ALBUMIN 2.1*   No results for input(s): LIPASE, AMYLASE in the last 168 hours. No results for input(s): AMMONIA in the last 168 hours. Coagulation Profile:  Recent Labs Lab 09/08/16 1450  INR 1.31   Cardiac Enzymes: No results for input(s): CKTOTAL, CKMB, CKMBINDEX, TROPONINI in the last 168 hours. BNP (last 3 results) No results for input(s): PROBNP in the last  8760 hours. HbA1C: No results for input(s): HGBA1C in the last 72 hours. CBG: No results for input(s): GLUCAP in the last 168 hours. Lipid Profile: No results for input(s): CHOL, HDL, LDLCALC, TRIG, CHOLHDL, LDLDIRECT in the last 72 hours. Thyroid Function Tests: No results for input(s): TSH, T4TOTAL, FREET4, T3FREE, THYROIDAB in the last 72 hours. Anemia Panel: No results for input(s): VITAMINB12, FOLATE, FERRITIN, TIBC, IRON, RETICCTPCT in the last 72 hours. Urine analysis:    Component Value Date/Time   COLORURINE YELLOW 09/08/2016 1500   APPEARANCEUR CLEAR 09/08/2016 1500   LABSPEC 1.016 09/08/2016 1500   PHURINE 5.5 09/08/2016 1500   GLUCOSEU NEGATIVE 09/08/2016 1500   HGBUR SMALL (A) 09/08/2016 1500   BILIRUBINUR NEGATIVE 09/08/2016 1500   KETONESUR NEGATIVE 09/08/2016 1500   PROTEINUR NEGATIVE 09/08/2016 1500   UROBILINOGEN 1.0 11/02/2014 0924   NITRITE NEGATIVE 09/08/2016 1500   LEUKOCYTESUR TRACE (A) 09/08/2016 1500   Sepsis Labs: _1 (procalcitonin:4,lacticidven:4)  Culture, blood (Routine x 2)     Status: None (Preliminary result)   Collection Time: 09/08/16  1:06 PM  Result Value Ref Range Status   Specimen Description BLOOD LEFT HAND  Final   Special Requests BOTTLES DRAWN AEROBIC AND ANAEROBIC 5CC  Final   Culture   Final    NO GROWTH < 24 HOURS Performed at [facility]    Report Status PENDING  Incomplete  Culture, blood (Routine x 2)     Status: None (Preliminary result)   Collection Time: 09/08/16  1:09 PM  Result Value Ref Range Status   Specimen Description BLOOD RIGHT ANTECUBITAL  Final   Special Requests BOTTLES DRAWN AEROBIC AND ANAEROBIC 5CC  Final   Culture  Setup Time   Final    GRAM POSITIVE COCCI IN CLUSTERS   Report Status PENDING  Incomplete  Blood Culture ID Panel (Reflexed)     Status: Abnormal   Collection Time: 09/08/16  1:09 PM  Result Value Ref Range Status   Enterococcus species NOT DETECTED NOT DETECTED Final    Listeria monocytogenes NOT DETECTED NOT DETECTED Final   Staphylococcus species DETECTED (A) NOT DETECTED Final   Staphylococcus aureus NOT DETECTED NOT DETECTED Final   Methicillin resistance NOT DETECTED NOT DETECTED Final   Streptococcus species NOT DETECTED NOT DETECTED Final   Streptococcus agalactiae NOT DETECTED NOT DETECTED Final   Streptococcus pneumoniae NOT DETECTED NOT DETECTED Final   Streptococcus pyogenes NOT DETECTED NOT DETECTED Final   Acinetobacter baumannii NOT DETECTED NOT DETECTED Final   Enterobacteriaceae species NOT DETECTED NOT DETECTED Final   Enterobacter cloacae complex NOT DETECTED NOT DETECTED Final  Escherichia coli NOT DETECTED NOT DETECTED Final   Klebsiella oxytoca NOT DETECTED NOT DETECTED Final   Klebsiella pneumoniae NOT DETECTED NOT DETECTED Final   Proteus species NOT DETECTED NOT DETECTED Final   Serratia marcescens NOT DETECTED NOT DETECTED Final   Haemophilus influenzae NOT DETECTED NOT DETECTED Final   Neisseria meningitidis NOT DETECTED NOT DETECTED Final   Pseudomonas aeruginosa NOT DETECTED NOT DETECTED Final   Candida albicans NOT DETECTED NOT DETECTED Final   Candida glabrata NOT DETECTED NOT DETECTED Final   Candida krusei NOT DETECTED NOT DETECTED Final   Candida parapsilosis NOT DETECTED NOT DETECTED Final   Candida tropicalis NOT DETECTED NOT DETECTED Final    Comment: Performed at [facility]  Urine culture     Status: None   Collection Time: 09/08/16  3:00 PM  Result Value Ref Range Status   Specimen Description URINE, CATHETERIZED  Final   Special Requests NONE  Final   Culture NO GROWTH Performed at [facility]   Final   Report Status 09/09/2016 FINAL  Final  MRSA PCR Screening     Status: None   Collection Time: 09/08/16 10:21 PM  Result Value Ref Range Status   MRSA by PCR NEGATIVE NEGATIVE Final      Radiology Studies: Dg Chest Portable 1 View Result Date: 09/08/2016 Limited exam given the  positioning with low lung volumes and likely atelectasis. No definite lobar pneumonia.   US Abdomen Limited Ruq Result Date: 09/09/2016 Surgically absent gallbladder.  No biliary ductal dilatation. Hepatic steatosis.      Scheduled Meds: . clonazePAM  1 mg Per Tube Q8H  . docusate  200 mg Per Tube BID  . feeding supplement   30 mL Per Tube Daily  . free water  100 mL Per Tube Q4H  . heparin  5,000 Units Subcutaneous Q8H  . levETIRAcetam  500 mg Per Tube Q0600  . levothyroxine  100 mcg Oral QAC breakfast  . mouth rinse  15 mL Mouth Rinse q12n4p  . metoprolol tartrate  12.5 mg Oral TID  . piperacillin-tazobac  3.375 g Intravenous Q8H  . ranitidine  150 mg Per Tube QHS  . TWOCAL HN  1,000 mL Per Tube Q24H  . Valproate Sodium  750 mg Per Tube TID   Continuous Infusions: . sodium chloride 50 mL/hr at 09/10/16 0600     LOS: 2 days    Time spent: 25 minutes  Greater than 50% of the time spent on counseling and coordinating the care.   Leisa Lenz, MD [facility] Pager [phone]  If 7PM-7AM, please contact night-coverage [email or web address] Password [email or web address] 09/10/2016, 10:10 AM

## 2016-09-10 NOTE — Care Management Note (Signed)
Case Management Note  Patient Details  Name: DENYS PARRADO MRN: [id number] Date of Birth: 28-Apr-1946  Subjective/Objective:      sepsis              Action/Plan:  SNF Date:  September 10, 2016 Chart reviewed for concurrent status and case management needs. Will continue to follow patient progress. Discharge Planning: following for needs Expected discharge date: [id number] Velva Harman, BSN, [place], [place]  Expected Discharge Date:                  Expected Discharge Plan:  [place]  In-House Referral:  Clinical Social Work  Discharge planning Services     Post Acute Care Choice:    Choice offered to:     DME Arranged:    DME Agency:     HH Arranged:    [place] Agency:     Status of Service:  In process, will continue to follow  If discussed at Long Length of Stay Meetings, dates discussed:    Additional Comments:  Leeroy Cha, RN 09/10/2016, 10:30 AM

## 2016-09-10 NOTE — Progress Notes (Signed)
Paged Dr. Charlies Silvers to inform here patient is hypotensive with BP 84/45, map 54, HR low 100s, tachypnia and respirations from 40-50 and patient being on RA at shift change, now to 4L [place] to maintain o2 sat .   Jenita Seashore

## 2016-09-10 NOTE — Progress Notes (Signed)
CRITICAL VALUE ALERT  Critical value received:  Lactic 5.5   Date of notification:  11/271/  Time of notification:  [id number]   Critical value read back:Yes.    Nurse who received alert:  Jerl Santos   MD notified (1st page):  Charlies Silvers   Time of first page:  0909  MD notified (2nd page):Devine  Time of second page: 0935  Responding MD:  Charlies Silvers   Time MD responded:  [phone]

## 2016-09-10 NOTE — Progress Notes (Deleted)
Dr. Charlies Silvers aware of latest lactic acid and vitals. No new orders given.  Jenita Seashore

## 2016-09-11 ENCOUNTER — Encounter (HOSPITAL_COMMUNITY): Payer: Self-pay | Admitting: Radiology

## 2016-09-11 ENCOUNTER — Inpatient Hospital Stay (HOSPITAL_COMMUNITY): Payer: Medicare Other

## 2016-09-11 DIAGNOSIS — D72828 Other elevated white blood cell count: Secondary | ICD-10-CM

## 2016-09-11 LAB — CULTURE, BLOOD (ROUTINE X 2)

## 2016-09-11 LAB — BASIC METABOLIC PANEL
Anion gap: 5 (ref 5–15)
BUN: 18 mg/dL (ref 6–20)
CALCIUM: 8.3 mg/dL — AB (ref 8.9–10.3)
CHLORIDE: 108 mmol/L (ref 101–111)
CO2: 29 mmol/L (ref 22–32)
CREATININE: 0.55 mg/dL (ref 0.44–1.00)
GFR calc Af Amer: >60 mL/min (ref >60)
GFR calc non Af Amer: >60 mL/min (ref >60)
GLUCOSE: 146 mg/dL — AB (ref 65–99)
Potassium: 3.5 mmol/L (ref 3.5–5.1)
Sodium: 142 mmol/L (ref 135–145)

## 2016-09-11 LAB — CBC
HEMATOCRIT: 31.2 % — AB (ref 36.0–46.0)
HEMOGLOBIN: 10.2 g/dL — AB (ref 12.0–15.0)
MCH: 33.4 pg (ref 26.0–34.0)
MCHC: 32.7 g/dL (ref 30.0–36.0)
MCV: 102.3 fL — AB (ref 78.0–100.0)
Platelets: 278 10*3/uL (ref 150–400)
RBC: 3.05 MIL/uL — ABNORMAL LOW (ref 3.87–5.11)
RDW: 16.1 % — AB (ref 11.5–15.5)
WBC: 16.6 10*3/uL — ABNORMAL HIGH (ref 4.0–10.5)

## 2016-09-11 LAB — LACTIC ACID, PLASMA: Lactic Acid, Venous: 3.9 mmol/L (ref 0.5–1.9)

## 2016-09-11 LAB — VANCOMYCIN, TROUGH: VANCOMYCIN TR: 11 ug/mL — AB (ref 15–20)

## 2016-09-11 MED ORDER — IOPAMIDOL (ISOVUE-300) INJECTION 61%
15.0000 mL | Freq: Once | INTRAVENOUS | Status: DC | PRN
Start: 2016-09-11 — End: 2016-09-18
  Administered 2016-09-11: 15 mL via ORAL
  Filled 2016-09-11: qty 30

## 2016-09-11 MED ORDER — IOPAMIDOL (ISOVUE-300) INJECTION 61%
100.0000 mL | Freq: Once | INTRAVENOUS | Status: AC | PRN
  Administered 2016-09-11: 100 mL via INTRAVENOUS

## 2016-09-11 MED ORDER — VANCOMYCIN HCL IN DEXTROSE 750-5 MG/150ML-% IV SOLN
750.0000 mg | Freq: Two times a day (BID) | INTRAVENOUS | Status: DC
  Administered 2016-09-11 – 2016-09-14 (×7): 750 mg via INTRAVENOUS
  Filled 2016-09-11 (×9): qty 150

## 2016-09-11 NOTE — Progress Notes (Addendum)
Nutrition Follow-up  DOCUMENTATION CODES:   Not applicable  INTERVENTION:  - Continue TwoCal HN @ 39 mL/hr with 30 mL Prostat once/day and 100 mL free water every 4 hours. - RD will follow-up 11/30.  NUTRITION DIAGNOSIS:   Swallowing difficulty related to dysphagia, lethargy/confusion as evidenced by other (see comment) (long-term PEG tube use to meet kcal/protein needs) -ongoing  GOAL:   Patient will meet greater than or equal to 90% of their needs -met with current TF regimen.   MONITOR:   TF tolerance, Weight trends, Labs, Skin, I & O's  ASSESSMENT:   70 y.o. female with medical history significant of seizure disorder, dementia, A. fib, hypothyroidism who presents with fevers from nursing home. The patient is unable to provide history. History is obtained by EMR and ED physician. Found to have SIRS with unknown source of infection (pulmonary suspected).   11/28 Pt is nonverbal and resting at time of RD visit with no family/visitors present at bedside. Per chart review, no new weight since admission date. Pt continues with TwoCal HN @ 39 mL/hr with 30 mL Prostat once/day and 100 mL free water every 4 hours which is providing 1972 kcal, 95 grams of protein, and 1264 mL free water which is meeting estimated nutrition needs. Will continue to monitor weight trends, if possible, during hospitalization to ensure pt is not being overfed.  Medications reviewed; 200 mg Colace BID, 500 mg Keppra per PEG/day, 100 mcg Synthroid per PEG/day.  Labs reviewed; Ca: 8.3 mg/dL.  IVF: NS @ 100 mL/hr. BP: 120/48 and MAP: 76.    11/26 - Noted in chart that patient is nonverbal so was unable to obtain history at bedside.  - No family present.  - Per chart patient is from [facility] and typically receives TwoCalHN via PEG @ 39 ml/hr x 24 hrs + Pro-Stat 30 ml daily.  - Patient receives free water flushes of 100 ml Q4hrs.  - Patient's weight fluctuates in chart between 62.5 and 67.7 kg excluding  outliers.  - Will use first admission weight of 65.3 kg to estimate needs as patient has received >2.5 L of fluid since admission. - Access: PEG tube - current PEG placed 04/12/2016 per chart - Nutrition-Focused physical exam completed.  - Findings are no fat depletion, no muscle depletion.  - Unable to assess patient's back (thoracic/lumbar region and scapular bone region) or edema.  - Discussed with RN. Also spoke with Pharmacy. There are approximately seven 1 L bottles of 2CalHN in stock - patient will require 1 bottle daily.     Diet Order:  Diet Heart Room service appropriate? Yes; Fluid consistency: Thin  Skin:  Reviewed, no issues  Last BM:  11/27  Height:   Ht Readings from Last 1 Encounters:  09/08/16 5' (1.524 m)    Weight:   Wt Readings from Last 1 Encounters:  09/08/16 149 lb 4 oz (67.7 kg)    Ideal Body Weight:  45.45 kg  BMI:  Body mass index is 29.15 kg/m.  Estimated Nutritional Needs:   Kcal:  1800-2000 (~27-30 kcal/kg)  Protein:  85-100 grams (1.3-1.5 grams/kg)  Fluid:  >/= 1.6 L/day (25 ml/kg)  EDUCATION NEEDS:   No education needs identified at this time    Jarome Matin, MS, RD, LDN, CNSC Inpatient Clinical Dietitian Pager # [phone] After hours/weekend pager # [phone]

## 2016-09-11 NOTE — Progress Notes (Addendum)
Patient ID: Dawn [name], female   DOB: 1946/05/13, 70 y.o.   MRN: [id number]  PROGRESS NOTE    Dawn [name]  MCN:[id number] DOB: [birth year] DOA: 09/08/2016  PCP: Inocencio Homes, MD   Brief Narrative:  70 y.o. female with past medical history significant for developmental delay, seizure disorder, dementia, A. fib, hypothyroidism who presented from SNF with fevers. She was hospitalized for aspiration pneumonia in 05/2016. In ED, BP was 94/62, HR 111, RR 11-48, T max 103.52F, O2 sat 93%. Her blood work showed WBC count 27.8, lactic acid 8.3, potassium 5.5 (repeat lab WNL), normal Cr. She was started on vanco and zosyn and admitted to SDU.  Hospital course is complicated with ongoing fever and elevated lactic acid despite being on vanco and zosyn.  Assessment & Plan:   Active Problems: Sepsis ([place]) / coag negative staph bacteremia / Leukocytosis / Lactic acidosis  - Sepsis criteria met on admission with fever, tachycardia, tachypnea, hypotension, leukocytosis, lactic acidosis - Source of infection is likely bacteremia. Please note blood cultures on 09/08/2016 are growing staph species coag negative one of the blood cultures while the other blood culture show no growth - Repeat blood cultures 09/10/2016 so far show gram-positive cocci in clusters - Urine cx showed no growth  - We will obtain CT abdomen, pelvis, chest for further evaluation of ongoing fever - Follow up lactic acid still high at 5.5  - Check ing lactic acid this am - Blood pressure stable, 124/72 - Transfer to telemetry floor today  Seizure disorder - Continue Keppra, Depakote, Clonazepam - Monitor for seizures   Hypothyroidism - Continue synthroid  Essential hypertension - Stopped metoprolol 11/27 due to hypotension   Dysphagia - Peg feeds     DVT prophylaxis: heparin subQ Code Status: DNR/DNI Family Communication: no family at the bedside this am Disposition Plan: Transfer to telemetry floor  today   Consultants:   None   Procedures:  None   Antimicrobials:   Vanco and Zosyn 09/08/2016 -->   Subjective: No overnight events.   Objective: Vitals:   09/11/16 0300 09/11/16 0400 09/11/16 0500 09/11/16 0600  BP: (!) 85/42 (!) 91/42 (!) 140/55 124/72  Pulse: 95 91 95 100  Resp: '17 18 15 ' (!) 25  Temp:  98.6 F (37 C)    TempSrc:      SpO2: 100% 100% 100% 100%  Weight:      Height:        Intake/Output Summary (Last 24 hours) at 09/11/16 6294 Last data filed at 09/11/16 0600  Gross per 24 hour  Intake          3377.17 ml  Output                0 ml  Net          3377.17 ml   Filed Weights   09/08/16 1315 09/08/16 2210  Weight: 65.3 kg (144 lb) 67.7 kg (149 lb 4 oz)    Examination:  General exam: No acute distress, calm Respiratory system: Bilateral air entry, no wheezing Cardiovascular system: S1 & S2 heard, rate controlled Gastrointestinal system: (+) BS, non tender, peg in place, no distention Central nervous system: Disoriented, minimally verbal, No focal neurological deficits. Extremities: No swelling, pulses palpable Skin: warm, dry  Psychiatry: Normal mood and affect, no agitation or restlessness  Data Reviewed: I have personally reviewed following labs and imaging studies  CBC:  Recent Labs Lab 09/08/16 1309 09/09/16 0354 09/10/16 0340 09/11/16 0335  WBC 27.8* 27.4* 23.7* 16.6*  NEUTROABS 20.0*  --   --   --   HGB 14.1 12.1 10.3* 10.2*  HCT 41.3 36.5 31.3* 31.2*  MCV 99.5 99.5 101.3* 102.3*  PLT 433* 332 311 272   Basic Metabolic Panel:  Recent Labs Lab 09/08/16 1309 09/09/16 0354 09/10/16 0340 09/11/16 0335  NA 133* 136 139 142  K 5.5* 4.0 3.7 3.5  CL 98* 103 104 108  CO2 '22 25 25 29  ' GLUCOSE 115* 93 161* 146*  BUN 25* 22* 20 18  CREATININE 0.87 0.72 0.68 0.55  CALCIUM 8.2* 8.1* 8.2* 8.3*   GFR: Estimated Creatinine Clearance: 56.2 mL/min (by C-G formula based on SCr of 0.55 mg/dL). Liver Function Tests:  Recent  Labs Lab 09/08/16 1309  AST 161*  ALT 54  ALKPHOS 347*  BILITOT 2.1*  PROT 7.6  ALBUMIN 2.1*   No results for input(s): LIPASE, AMYLASE in the last 168 hours. No results for input(s): AMMONIA in the last 168 hours. Coagulation Profile:  Recent Labs Lab 09/08/16 1450  INR 1.31   Cardiac Enzymes: No results for input(s): CKTOTAL, CKMB, CKMBINDEX, TROPONINI in the last 168 hours. BNP (last 3 results) No results for input(s): PROBNP in the last 8760 hours. HbA1C: No results for input(s): HGBA1C in the last 72 hours. CBG: No results for input(s): GLUCAP in the last 168 hours. Lipid Profile: No results for input(s): CHOL, HDL, LDLCALC, TRIG, CHOLHDL, LDLDIRECT in the last 72 hours. Thyroid Function Tests: No results for input(s): TSH, T4TOTAL, FREET4, T3FREE, THYROIDAB in the last 72 hours. Anemia Panel: No results for input(s): VITAMINB12, FOLATE, FERRITIN, TIBC, IRON, RETICCTPCT in the last 72 hours. Urine analysis:    Component Value Date/Time   COLORURINE YELLOW 09/08/2016 1500   APPEARANCEUR CLEAR 09/08/2016 1500   LABSPEC 1.016 09/08/2016 1500   PHURINE 5.5 09/08/2016 1500   GLUCOSEU NEGATIVE 09/08/2016 1500   HGBUR SMALL (A) 09/08/2016 1500   BILIRUBINUR NEGATIVE 09/08/2016 1500   KETONESUR NEGATIVE 09/08/2016 1500   PROTEINUR NEGATIVE 09/08/2016 1500   UROBILINOGEN 1.0 11/02/2014 0924   NITRITE NEGATIVE 09/08/2016 1500   LEUKOCYTESUR TRACE (A) 09/08/2016 1500   Sepsis Labs: '@LABRCNTIP' (procalcitonin:4,lacticidven:4)  Results for orders placed or performed during the hospital encounter of 09/08/16  Culture, blood (Routine x 2)     Status: None (Preliminary result)   Collection Time: 09/08/16  1:06 PM  Result Value Ref Range Status   Specimen Description BLOOD LEFT HAND  Final   Special Requests BOTTLES DRAWN AEROBIC AND ANAEROBIC 5CC  Final   Culture   Final    NO GROWTH 2 DAYS Performed at [facility]    Report Status PENDING  Incomplete   Culture, blood (Routine x 2)     Status: Abnormal (Preliminary result)   Collection Time: 09/08/16  1:09 PM  Result Value Ref Range Status   Specimen Description BLOOD RIGHT ANTECUBITAL  Final   Special Requests BOTTLES DRAWN AEROBIC AND ANAEROBIC 5CC  Final   Culture  Setup Time   Final    GRAM POSITIVE COCCI IN CLUSTERS IN BOTH AEROBIC AND ANAEROBIC BOTTLES CRITICAL RESULT CALLED TO, READ BACK BY AND VERIFIED WITH: N GLOGOVAC,PHARMD AT 1253 09/09/16 BY L BENFIELD    Culture (A)  Final    STAPHYLOCOCCUS SPECIES (COAGULASE NEGATIVE) THE SIGNIFICANCE OF ISOLATING THIS ORGANISM FROM A SINGLE SET OF BLOOD CULTURES WHEN MULTIPLE SETS ARE DRAWN IS UNCERTAIN. PLEASE NOTIFY THE MICROBIOLOGY DEPARTMENT WITHIN ONE WEEK IF SPECIATION AND  SENSITIVITIES ARE REQUIRED. Performed at [facility]    Report Status PENDING  Incomplete  Blood Culture ID Panel (Reflexed)     Status: Abnormal   Collection Time: 09/08/16  1:09 PM  Result Value Ref Range Status   Enterococcus species NOT DETECTED NOT DETECTED Final   Listeria monocytogenes NOT DETECTED NOT DETECTED Final   Staphylococcus species DETECTED (A) NOT DETECTED Final    Comment: CRITICAL RESULT CALLED TO, READ BACK BY AND VERIFIED WITH: N GLOGOVAC,PHARMD AT 1253 09/09/16 BY L BENFIELD    Staphylococcus aureus NOT DETECTED NOT DETECTED Final   Methicillin resistance NOT DETECTED NOT DETECTED Final   Streptococcus species NOT DETECTED NOT DETECTED Final   Streptococcus agalactiae NOT DETECTED NOT DETECTED Final   Streptococcus pneumoniae NOT DETECTED NOT DETECTED Final   Streptococcus pyogenes NOT DETECTED NOT DETECTED Final   Acinetobacter baumannii NOT DETECTED NOT DETECTED Final   Enterobacteriaceae species NOT DETECTED NOT DETECTED Final   Enterobacter cloacae complex NOT DETECTED NOT DETECTED Final   Escherichia coli NOT DETECTED NOT DETECTED Final   Klebsiella oxytoca NOT DETECTED NOT DETECTED Final   Klebsiella pneumoniae NOT  DETECTED NOT DETECTED Final   Proteus species NOT DETECTED NOT DETECTED Final   Serratia marcescens NOT DETECTED NOT DETECTED Final   Haemophilus influenzae NOT DETECTED NOT DETECTED Final   Neisseria meningitidis NOT DETECTED NOT DETECTED Final   Pseudomonas aeruginosa NOT DETECTED NOT DETECTED Final   Candida albicans NOT DETECTED NOT DETECTED Final   Candida glabrata NOT DETECTED NOT DETECTED Final   Candida krusei NOT DETECTED NOT DETECTED Final   Candida parapsilosis NOT DETECTED NOT DETECTED Final   Candida tropicalis NOT DETECTED NOT DETECTED Final    Comment: Performed at [facility]  Urine culture     Status: None   Collection Time: 09/08/16  3:00 PM  Result Value Ref Range Status   Specimen Description URINE, CATHETERIZED  Final   Special Requests NONE  Final   Culture NO GROWTH Performed at [facility]   Final   Report Status 09/09/2016 FINAL  Final  MRSA PCR Screening     Status: None   Collection Time: 09/08/16 10:21 PM  Result Value Ref Range Status   MRSA by PCR NEGATIVE NEGATIVE Final    Comment:        The GeneXpert MRSA Assay (FDA approved for NASAL specimens only), is one component of a comprehensive MRSA colonization surveillance program. It is not intended to diagnose MRSA infection nor to guide or monitor treatment for MRSA infections.   Respiratory Panel by PCR     Status: None   Collection Time: 09/09/16  9:43 AM  Result Value Ref Range Status   Adenovirus NOT DETECTED NOT DETECTED Final   Coronavirus 229E NOT DETECTED NOT DETECTED Final   Coronavirus HKU1 NOT DETECTED NOT DETECTED Final   Coronavirus NL63 NOT DETECTED NOT DETECTED Final   Coronavirus OC43 NOT DETECTED NOT DETECTED Final   Metapneumovirus NOT DETECTED NOT DETECTED Final   Rhinovirus / Enterovirus NOT DETECTED NOT DETECTED Final   Influenza A NOT DETECTED NOT DETECTED Final   Influenza B NOT DETECTED NOT DETECTED Final   Parainfluenza Virus 1 NOT DETECTED NOT  DETECTED Final   Parainfluenza Virus 2 NOT DETECTED NOT DETECTED Final   Parainfluenza Virus 3 NOT DETECTED NOT DETECTED Final   Parainfluenza Virus 4 NOT DETECTED NOT DETECTED Final   Respiratory Syncytial Virus NOT DETECTED NOT DETECTED Final   Bordetella pertussis  NOT DETECTED NOT DETECTED Final   Chlamydophila pneumoniae NOT DETECTED NOT DETECTED Final   Mycoplasma pneumoniae NOT DETECTED NOT DETECTED Final    Comment: Performed at [facility]  Culture, blood (routine x 2)     Status: None (Preliminary result)   Collection Time: 09/10/16 10:29 AM  Result Value Ref Range Status   Specimen Description BLOOD RIGHT HAND  Final   Special Requests IN PEDIATRIC BOTTLE 4CC  Final   Culture  Setup Time   Final    GRAM POSITIVE COCCI IN CLUSTERS IN PEDIATRIC BOTTLE CRITICAL RESULT CALLED TO, READ BACK BY AND VERIFIED WITH: Jaquelyn Bitter AT [id number] ON [id number] BY Rhea Bleacher    Culture   Final    TOO YOUNG TO READ Performed at [facility]    Report Status PENDING  Incomplete     Radiology Studies: Dg Chest Portable 1 View Result Date: 09/08/2016 Limited exam given the positioning with low lung volumes and likely atelectasis. No definite lobar pneumonia.   US Abdomen Limited Ruq Result Date: 09/09/2016 Surgically absent gallbladder.  No biliary ductal dilatation. Hepatic steatosis.      Scheduled Meds: . clonazePAM  1 mg Per Tube Q8H  . docusate  200 mg Per Tube BID  . feeding supplement   30 mL Per Tube Daily  . free water  100 mL Per Tube Q4H  . heparin  5,000 Units Subcutaneous Q8H  . levETIRAcetam  500 mg Per Tube Q0600  . levothyroxine  100 mcg Oral QAC breakfast  . mouth rinse  15 mL Mouth Rinse q12n4p  . metoprolol tartrate  12.5 mg Oral TID  . piperacillin-tazobac  3.375 g Intravenous Q8H  . ranitidine  150 mg Per Tube QHS  . TWOCAL HN  1,000 mL Per Tube Q24H  . Valproate Sodium  750 mg Per Tube TID   Continuous Infusions: . sodium chloride 100 mL/hr  at 09/11/16 0626     LOS: 3 days    Time spent: 25 minutes  Greater than 50% of the time spent on counseling and coordinating the care.   Leisa Lenz, MD [facility] Pager [phone]  If 7PM-7AM, please contact night-coverage [email or web address] Password [facility] 09/11/2016, 7:21 AM

## 2016-09-11 NOTE — Progress Notes (Signed)
CSW consulted to assist with d/c planning. Pt is unable to participate in d/c planning due to medical / cognitive status. CSW has left VM for pt's sister, Dana Allan [phone], to contact CSW for assistance with d/c planning. Awaiting return call. Pt is a LTC resident from [facility]. CSW will continue to follow to assist with d/c planning.  Werner Lean LCSW [phone]

## 2016-09-11 NOTE — Progress Notes (Signed)
Pharmacy Antibiotic Note  Dawn [name] is a 70 y.o. female presented to the ED  on 09/08/2016 with fever. Broad abx with vancomycin and zosyn were started for suspected sepsis.   Today, 09/11/2016: - Tmax 100.9, wbc elevated but down - scr ok (crcl~56), LA down 3.9 - repeat bcx with 1/2 GPC in clusters - vancomycin trough now back slightly subtherapeutic at 11 (goal 15-20)  Plan: - increase vancomycin to 750 mg IV q12h - continue zosyn 3.375 gm IV q8h (infuse over 4 hours) - f/u cx ______________________________  Height: 5' (152.4 cm) Weight: 149 lb 4 oz (67.7 kg) IBW/kg (Calculated) : 45.5  Temp (24hrs), Avg:98.5 F (36.9 C), Min:97.9 F (36.6 C), Max:99.5 F (37.5 C)   Recent Labs Lab 09/08/16 1309 09/08/16 1317 09/08/16 1641 09/09/16 0354 09/10/16 0340 09/10/16 0755 09/10/16 1029 09/11/16 0335  WBC 27.8*  --   --  27.4* 23.7*  --   --  16.6*  CREATININE 0.87  --   --  0.72 0.68  --   --  0.55  LATICACIDVEN  --  8.30* 5.79*  --   --  5.5* 5.5*  --     Estimated Creatinine Clearance: 56.2 mL/min (by C-G formula based on SCr of 0.55 mg/dL).    Allergies  Allergen Reactions  . Carvedilol   . Ppd [Tuberculin Purified Protein Derivative]     Per MAR   Antimicrobials this admission:  11/25 Vancomycin >>  11/25 Zosyn>>   Dose adjustments this admission:  11/28 VT at 0900 = 11 ( 500 mg q12h) --> increase to 750 mg q12h  Microbiology results:  11/25 BCx x2: 1/2 GPC (BCID + staph species, no resistance) ++likely contaminant, did not recommend de-escalate (protocol=Ancef) at this time since treating asp PNA ++ 11/25 UCx: neg FINAL 11/26 Resp Panel PCR: neg 11/25 MRSA PCR: negative 11/27 bcx x2: 1/2 GPC in clusters (lab not repeating BCID since already ran it with first cx) ++ Dr. Charlies Silvers notified on 11/28, said to cont with vanc for now++   Thank you for allowing pharmacy to be a part of this patient's care.  Lynelle Doctor 09/11/2016 8:32 AM

## 2016-09-12 ENCOUNTER — Inpatient Hospital Stay (HOSPITAL_COMMUNITY): Payer: Medicare Other

## 2016-09-12 DIAGNOSIS — J810 Acute pulmonary edema: Secondary | ICD-10-CM

## 2016-09-12 DIAGNOSIS — R6520 Severe sepsis without septic shock: Secondary | ICD-10-CM

## 2016-09-12 LAB — COMPREHENSIVE METABOLIC PANEL
ALK PHOS: 394 U/L — AB (ref 38–126)
ALT: 43 U/L (ref 14–54)
ANION GAP: 7 (ref 5–15)
AST: 98 U/L — ABNORMAL HIGH (ref 15–41)
Albumin: 1.8 g/dL — ABNORMAL LOW (ref 3.5–5.0)
BILIRUBIN TOTAL: 1.5 mg/dL — AB (ref 0.3–1.2)
BUN: 13 mg/dL (ref 6–20)
CALCIUM: 8.2 mg/dL — AB (ref 8.9–10.3)
CO2: 24 mmol/L (ref 22–32)
Chloride: 107 mmol/L (ref 101–111)
Creatinine, Ser: 0.55 mg/dL (ref 0.44–1.00)
Glucose, Bld: 119 mg/dL — ABNORMAL HIGH (ref 65–99)
POTASSIUM: 3.3 mmol/L — AB (ref 3.5–5.1)
Sodium: 138 mmol/L (ref 135–145)
TOTAL PROTEIN: 6.7 g/dL (ref 6.5–8.1)

## 2016-09-12 LAB — BLOOD GAS, ARTERIAL
Acid-base deficit: 0 mmol/L (ref 0.0–2.0)
Bicarbonate: 23.8 mmol/L (ref 20.0–28.0)
DRAWN BY: 331471
FIO2: 0.21
O2 SAT: 93.2 %
PATIENT TEMPERATURE: 98.6
PO2 ART: 67.4 mmHg — AB (ref 83.0–108.0)
pCO2 arterial: 37.7 mmHg (ref 32.0–48.0)
pH, Arterial: 7.416 (ref 7.350–7.450)

## 2016-09-12 LAB — CBC
HCT: 30.1 % — ABNORMAL LOW (ref 36.0–46.0)
HEMATOCRIT: 32.2 % — AB (ref 36.0–46.0)
HEMOGLOBIN: 10.4 g/dL — AB (ref 12.0–15.0)
HEMOGLOBIN: 10 g/dL — AB (ref 12.0–15.0)
MCH: 32.7 pg (ref 26.0–34.0)
MCH: 33.7 pg (ref 26.0–34.0)
MCHC: 32.3 g/dL (ref 30.0–36.0)
MCHC: 33.2 g/dL (ref 30.0–36.0)
MCV: 101.3 fL — ABNORMAL HIGH (ref 78.0–100.0)
MCV: 101.3 fL — ABNORMAL HIGH (ref 78.0–100.0)
Platelets: 297 10*3/uL (ref 150–400)
Platelets: 292 10*3/uL (ref 150–400)
RBC: 3.18 MIL/uL — ABNORMAL LOW (ref 3.87–5.11)
RBC: 2.97 MIL/uL — AB (ref 3.87–5.11)
RDW: 16.1 % — ABNORMAL HIGH (ref 11.5–15.5)
RDW: 16.1 % — ABNORMAL HIGH (ref 11.5–15.5)
WBC: 16.2 10*3/uL — AB (ref 4.0–10.5)
WBC: 14.1 10*3/uL — AB (ref 4.0–10.5)

## 2016-09-12 LAB — LACTIC ACID, PLASMA
LACTIC ACID, VENOUS: 5.2 mmol/L — AB (ref 0.5–1.9)
Lactic Acid, Venous: 5.5 mmol/L (ref 0.5–1.9)

## 2016-09-12 LAB — PROCALCITONIN: PROCALCITONIN: 1.91 ng/mL

## 2016-09-12 MED ORDER — FUROSEMIDE 10 MG/ML IJ SOLN
60.0000 mg | Freq: Once | INTRAMUSCULAR | Status: AC
  Administered 2016-09-12: 60 mg via INTRAVENOUS

## 2016-09-12 MED ORDER — FUROSEMIDE 10 MG/ML IJ SOLN
INTRAMUSCULAR | Status: AC
Start: 2016-09-12 — End: 2016-09-12
  Filled 2016-09-12: qty 2

## 2016-09-12 MED ORDER — FUROSEMIDE 10 MG/ML IJ SOLN
INTRAMUSCULAR | Status: AC
Start: 2016-09-12 — End: 2016-09-12
  Filled 2016-09-12: qty 4

## 2016-09-12 MED ORDER — SODIUM CHLORIDE 0.9 % IV BOLUS (SEPSIS)
2000.0000 mL | Freq: Once | INTRAVENOUS | Status: AC
  Administered 2016-09-12: 2000 mL via INTRAVENOUS

## 2016-09-12 NOTE — Progress Notes (Signed)
PROGRESS NOTE                                                                                                                                                                                                             Patient Demographics:    Dawn [name], is a 70 y.o. female, DOB - [birth year], [id number]  Admit date - 09/08/2016   Admitting Physician Velvet Bathe, MD  Outpatient Primary MD for the patient is Inocencio Homes, MD  LOS - 4  Outpatient Specialists: none  Chief Complaint  Patient presents with  . Altered Mental Status  . Code Sepsis  . Hyperventilating       Brief Narrative   70 year old female with developmental delay, seizure disorder, dementia, A. fib, hypothyroidism who was sent from SNF with fever. She was hospitalized in August this year with aspiration pneumonia. Patient septic in the ED with hypotension, tachycardia, tachypnea and fever of 103.66F. Blood work showed WBC of 20 7.8K, lactic acid of 8.3 and potassium of 5.5. She was started on empiric vancomycin and Zosyn. Patient admitted to stepdown unit and transferred to medical floor 2 days later.    Subjective:   Patient still has soft blood pressure and elevated lactic acid of 5.4. Order for IV fluid bolus and shortly thereafter became short of breath and tachypnea. Course crackles on exam so IV fluids was discontinued and given 60 mg of IV Lasix, ordered Foley.   Assessment  & Plan :    Principal Problem:  Severe Sepsis ([place]) Source unclear. Empiric IV vancomycin and Zosyn. (Day 5). Blood cultures and respiratory panel on admission were negative however continues to have high lactic acid. Ordered repeat respiratory panel and LFTs.  Pro calcitonin was negative.  Blood culture on admission 1/2 growing coag-negative staph (possibly contaminant) however repeat culture on 11/27 (1/2) growing gram-positive cocci in clusters. Will follow  sensitivity. Check 2-D echo to rule out vegetation. CT chest abdomen and pelvis which shows small amount of intra-abdominal fluids which is unremarkable. Abdominal ultrasound on admission unremarkable as well. -Discussed with sister at bedside who informed her mentation is at baseline. (Patient is nonverbal).  Active Problems: Acute respiratory failure with hypoxia Possibly due to volume overload. Ordered IV Lasix and stopped fluids. O2 via nasal cannula and continuous pulse ox monitoring. Foley catheter for strict I/O. May need  to be transferred to step down if unimproved.     Seizure disorder (HCC) Continue Keppra, Depakote and clonazepam.    Hypothyroidism Continue Synthroid.  Essential hypertension Holding metoprolol due to hypertension.      Code Status : DO NOT RESUSCITATE  Family Communication  : Sister who is also the POA at bedside  Disposition Plan : return to SNF once improved  Barriers For Discharge : Active symptoms  Consults  :  None  Procedures  : Ultrasound abdomen CT chest abdomen and pelvis  DVT Prophylaxis  :  Lovenox -   Lab Results  Component Value Date   PLT 292 09/12/2016    Antibiotics  :   Anti-infectives    Start     Dose/Rate Route Frequency Ordered Stop   09/11/16 1100  vancomycin (VANCOCIN) IVPB 750 mg/150 ml premix     750 mg 150 mL/hr over 60 Minutes Intravenous Every 12 hours 09/11/16 1035     09/09/16 2100  vancomycin (VANCOCIN) 500 mg in sodium chloride 0.9 % 100 mL IVPB  Status:  Discontinued     500 mg 100 mL/hr over 60 Minutes Intravenous Every 12 hours 09/09/16 2050 09/11/16 1035   09/09/16 0200  vancomycin (VANCOCIN) 500 mg in sodium chloride 0.9 % 100 mL IVPB  Status:  Discontinued     500 mg 100 mL/hr over 60 Minutes Intravenous Every 12 hours 09/08/16 1437 09/09/16 1123   09/08/16 2000  piperacillin-tazobactam (ZOSYN) IVPB 3.375 g     3.375 g 12.5 mL/hr over 240 Minutes Intravenous Every 8 hours 09/08/16 1437      09/08/16 1345  piperacillin-tazobactam (ZOSYN) IVPB 3.375 g     3.375 g 100 mL/hr over 30 Minutes Intravenous  Once 09/08/16 1340 09/08/16 1415   09/08/16 1345  vancomycin (VANCOCIN) IVPB 1000 mg/200 mL premix     1,000 mg 200 mL/hr over 60 Minutes Intravenous STAT 09/08/16 1340 09/08/16 1453        Objective:   Vitals:   09/11/16 1900 09/11/16 2215 09/12/16 0511 09/12/16 0942  BP: (!) 115/45 (!) 115/56 (!) 125/56 (!) 134/59  Pulse:  73 (!) 101 (!) 102  Resp: 20 20 18 18   Temp: 98.7 F (37.1 C) 99.4 F (37.4 C) 98.5 F (36.9 C)   TempSrc: Oral Axillary Axillary   SpO2: 100% 100% 98% 97%  Weight:      Height:        Wt Readings from Last 3 Encounters:  09/08/16 67.7 kg (149 lb 4 oz)  08/23/16 65.4 kg (144 lb 3.2 oz)  07/25/16 63.5 kg (140 lb)     Intake/Output Summary (Last 24 hours) at 09/12/16 1337 Last data filed at 09/12/16 0600  Gross per 24 hour  Intake             3279 ml  Output                0 ml  Net             3279 ml     Physical Exam  [id number] but arousable, non-verbal HEENT: Moist mucosa, supple neck Chest: Coarse crackles bilaterally with scattered rhonchi CVS: S1 and S2 tachycardic, no murmurs or gallop GI: soft, NT, ND, BS+, G-tube in place Musculoskeletal: warm , 1+ pitting edema bilaterally CNS: Awake to commands but noncommunicative (baseline)    Data Review:    CBC  Recent Labs Lab 09/08/16 1309 09/09/16 0354 09/10/16 0340 09/11/16 0335 09/12/16 0515 09/12/16 1111  WBC 27.8* 27.4* 23.7* 16.6* 16.2* 14.1*  HGB 14.1 12.1 10.3* 10.2* 10.4* 10.0*  HCT 41.3 36.5 31.3* 31.2* 32.2* 30.1*  PLT 433* 332 311 278 297 292  MCV 99.5 99.5 101.3* 102.3* 101.3* 101.3*  MCH 34.0 33.0 33.3 33.4 32.7 33.7  MCHC 34.1 33.2 32.9 32.7 32.3 33.2  RDW 16.0* 15.6* 15.9* 16.1* 16.1* 16.1*  LYMPHSABS 5.0*  --   --   --   --   --   MONOABS 2.8*  --   --   --   --   --   EOSABS 0.0  --   --   --   --   --   BASOSABS 0.0  --   --   --   --   --       Chemistries   Recent Labs Lab 09/08/16 1309 09/09/16 0354 09/10/16 0340 09/11/16 0335  NA 133* 136 139 142  K 5.5* 4.0 3.7 3.5  CL 98* 103 104 108  CO2 22 25 25 29   GLUCOSE 115* 93 161* 146*  BUN 25* 22* 20 18  CREATININE 0.87 0.72 0.68 0.55  CALCIUM 8.2* 8.1* 8.2* 8.3*  AST 161*  --   --   --   ALT 54  --   --   --   ALKPHOS 347*  --   --   --   BILITOT 2.1*  --   --   --    ------------------------------------------------------------------------------------------------------------------ No results for input(s): CHOL, HDL, LDLCALC, TRIG, CHOLHDL, LDLDIRECT in the last 72 hours.  No results found for: HGBA1C ------------------------------------------------------------------------------------------------------------------ No results for input(s): TSH, T4TOTAL, T3FREE, THYROIDAB in the last 72 hours.  Invalid input(s): FREET3 ------------------------------------------------------------------------------------------------------------------ No results for input(s): VITAMINB12, FOLATE, FERRITIN, TIBC, IRON, RETICCTPCT in the last 72 hours.  Coagulation profile  Recent Labs Lab 09/08/16 1450  INR 1.31    No results for input(s): DDIMER in the last 72 hours.  Cardiac Enzymes No results for input(s): CKMB, TROPONINI, MYOGLOBIN in the last 168 hours.  Invalid input(s): CK ------------------------------------------------------------------------------------------------------------------    Component Value Date/Time   BNP 257.3 (H) 10/27/2014 0015    Inpatient Medications  Scheduled Meds: . chlorhexidine  15 mL Mouth Rinse BID  . clonazePAM  1 mg Per Tube Q8H  . docusate  200 mg Per Tube BID  . feeding supplement (PRO-STAT SUGAR FREE 64)  30 mL Per Tube Daily  . free water  100 mL Per Tube Q4H  . furosemide      . furosemide      . furosemide  60 mg Intravenous Once  . levETIRAcetam  500 mg Per Tube Q0600  . levothyroxine  100 mcg Oral QAC breakfast  . mouth  rinse  15 mL Mouth Rinse q12n4p  . piperacillin-tazobactam (ZOSYN)  IV  3.375 g Intravenous Q8H  . ranitidine  150 mg Per Tube QHS  . sodium chloride flush  3 mL Intravenous Q12H  . TWOCAL HN  1,000 mL Per Tube Q24H  . Valproate Sodium  750 mg Per Tube TID  . vancomycin  750 mg Intravenous Q12H   Continuous Infusions: PRN Meds:.sodium chloride, acetaminophen **OR** acetaminophen, albuterol, iopamidol, sodium chloride flush  Micro Results Recent Results (from the past 240 hour(s))  Culture, blood (Routine x 2)     Status: None (Preliminary result)   Collection Time: 09/08/16  1:06 PM  Result Value Ref Range Status   Specimen Description BLOOD LEFT HAND  Final   Special Requests BOTTLES  DRAWN AEROBIC AND ANAEROBIC 5CC  Final   Culture   Final    NO GROWTH 3 DAYS Performed at [facility]    Report Status PENDING  Incomplete  Culture, blood (Routine x 2)     Status: Abnormal   Collection Time: 09/08/16  1:09 PM  Result Value Ref Range Status   Specimen Description BLOOD RIGHT ANTECUBITAL  Final   Special Requests BOTTLES DRAWN AEROBIC AND ANAEROBIC 5CC  Final   Culture  Setup Time   Final    GRAM POSITIVE COCCI IN CLUSTERS IN BOTH AEROBIC AND ANAEROBIC BOTTLES CRITICAL RESULT CALLED TO, READ BACK BY AND VERIFIED WITH: N GLOGOVAC,PHARMD AT 1253 09/09/16 BY L BENFIELD    Culture (A)  Final    STAPHYLOCOCCUS SPECIES (COAGULASE NEGATIVE) THE SIGNIFICANCE OF ISOLATING THIS ORGANISM FROM A SINGLE SET OF BLOOD CULTURES WHEN MULTIPLE SETS ARE DRAWN IS UNCERTAIN. PLEASE NOTIFY THE MICROBIOLOGY DEPARTMENT WITHIN ONE WEEK IF SPECIATION AND SENSITIVITIES ARE REQUIRED. Performed at [facility]    Report Status 09/11/2016 FINAL  Final  Blood Culture ID Panel (Reflexed)     Status: Abnormal   Collection Time: 09/08/16  1:09 PM  Result Value Ref Range Status   Enterococcus species NOT DETECTED NOT DETECTED Final   Listeria monocytogenes NOT DETECTED NOT DETECTED Final    Staphylococcus species DETECTED (A) NOT DETECTED Final    Comment: CRITICAL RESULT CALLED TO, READ BACK BY AND VERIFIED WITH: N GLOGOVAC,PHARMD AT 1253 09/09/16 BY L BENFIELD    Staphylococcus aureus NOT DETECTED NOT DETECTED Final   Methicillin resistance NOT DETECTED NOT DETECTED Final   Streptococcus species NOT DETECTED NOT DETECTED Final   Streptococcus agalactiae NOT DETECTED NOT DETECTED Final   Streptococcus pneumoniae NOT DETECTED NOT DETECTED Final   Streptococcus pyogenes NOT DETECTED NOT DETECTED Final   Acinetobacter baumannii NOT DETECTED NOT DETECTED Final   Enterobacteriaceae species NOT DETECTED NOT DETECTED Final   Enterobacter cloacae complex NOT DETECTED NOT DETECTED Final   Escherichia coli NOT DETECTED NOT DETECTED Final   Klebsiella oxytoca NOT DETECTED NOT DETECTED Final   Klebsiella pneumoniae NOT DETECTED NOT DETECTED Final   Proteus species NOT DETECTED NOT DETECTED Final   Serratia marcescens NOT DETECTED NOT DETECTED Final   Haemophilus influenzae NOT DETECTED NOT DETECTED Final   Neisseria meningitidis NOT DETECTED NOT DETECTED Final   Pseudomonas aeruginosa NOT DETECTED NOT DETECTED Final   Candida albicans NOT DETECTED NOT DETECTED Final   Candida glabrata NOT DETECTED NOT DETECTED Final   Candida krusei NOT DETECTED NOT DETECTED Final   Candida parapsilosis NOT DETECTED NOT DETECTED Final   Candida tropicalis NOT DETECTED NOT DETECTED Final    Comment: Performed at [facility]  Urine culture     Status: None   Collection Time: 09/08/16  3:00 PM  Result Value Ref Range Status   Specimen Description URINE, CATHETERIZED  Final   Special Requests NONE  Final   Culture NO GROWTH Performed at [facility]   Final   Report Status 09/09/2016 FINAL  Final  MRSA PCR Screening     Status: None   Collection Time: 09/08/16 10:21 PM  Result Value Ref Range Status   MRSA by PCR NEGATIVE NEGATIVE Final    Comment:        The GeneXpert MRSA  Assay (FDA approved for NASAL specimens only), is one component of a comprehensive MRSA colonization surveillance program. It is not intended to diagnose MRSA infection nor to  guide or monitor treatment for MRSA infections.   Respiratory Panel by PCR     Status: None   Collection Time: 09/09/16  9:43 AM  Result Value Ref Range Status   Adenovirus NOT DETECTED NOT DETECTED Final   Coronavirus 229E NOT DETECTED NOT DETECTED Final   Coronavirus HKU1 NOT DETECTED NOT DETECTED Final   Coronavirus NL63 NOT DETECTED NOT DETECTED Final   Coronavirus OC43 NOT DETECTED NOT DETECTED Final   Metapneumovirus NOT DETECTED NOT DETECTED Final   Rhinovirus / Enterovirus NOT DETECTED NOT DETECTED Final   Influenza A NOT DETECTED NOT DETECTED Final   Influenza B NOT DETECTED NOT DETECTED Final   Parainfluenza Virus 1 NOT DETECTED NOT DETECTED Final   Parainfluenza Virus 2 NOT DETECTED NOT DETECTED Final   Parainfluenza Virus 3 NOT DETECTED NOT DETECTED Final   Parainfluenza Virus 4 NOT DETECTED NOT DETECTED Final   Respiratory Syncytial Virus NOT DETECTED NOT DETECTED Final   Bordetella pertussis NOT DETECTED NOT DETECTED Final   Chlamydophila pneumoniae NOT DETECTED NOT DETECTED Final   Mycoplasma pneumoniae NOT DETECTED NOT DETECTED Final    Comment: Performed at [facility]  Culture, blood (routine x 2)     Status: None (Preliminary result)   Collection Time: 09/10/16 10:29 AM  Result Value Ref Range Status   Specimen Description BLOOD RIGHT HAND  Final   Special Requests IN PEDIATRIC BOTTLE 4CC  Final   Culture  Setup Time   Final    GRAM POSITIVE COCCI IN CLUSTERS IN PEDIATRIC BOTTLE CRITICAL RESULT CALLED TO, READ BACK BY AND VERIFIED WITH: Chales Abrahams, PHARM AT [place] ON [id number] BY Rhea Bleacher    Culture   Final    GRAM POSITIVE COCCI IDENTIFICATION AND SUSCEPTIBILITIES TO FOLLOW Performed at [facility]    Report Status PENDING  Incomplete  Culture, blood (routine x 2)      Status: None (Preliminary result)   Collection Time: 09/10/16 10:37 AM  Result Value Ref Range Status   Specimen Description BLOOD LEFT HAND  Final   Special Requests BOTTLES DRAWN AEROBIC ONLY 5CC  Final   Culture   Final    NO GROWTH 1 DAY Performed at [facility]    Report Status PENDING  Incomplete    Radiology Reports Ct Chest W Contrast  Result Date: 09/11/2016 CLINICAL DATA:  Sepsis EXAM: CT CHEST, ABDOMEN, AND PELVIS WITH CONTRAST TECHNIQUE: Multidetector CT imaging of the chest, abdomen and pelvis was performed following the standard protocol during bolus administration of intravenous contrast. CONTRAST:  128mL ISOVUE-300 IOPAMIDOL (ISOVUE-300) INJECTION 61% COMPARISON:  06/01/2014 FINDINGS: CT CHEST FINDINGS Cardiovascular: There is no evidence of aortic aneurysm, dissection, or intramural hematoma. Mild coronary artery calcifications in the LAD territory. Mitral annular calcification. Mediastinum/Nodes: No evidence of abnormal mediastinal adenopathy no pericardial effusion. No abnormal axillary adenopathy. Lungs/Pleura: Small bilateral pleural effusions. No pneumothorax. There is dependent atelectasis bilaterally. Hazy ground-glass throughout the remainder of the lungs likely reflects hypoaeration. Musculoskeletal: There is no vertebral compression deformity in the thoracic spine. CT ABDOMEN PELVIS FINDINGS Hepatobiliary: Diffuse hepatic steatosis.  Postcholecystectomy. Pancreas: There is stranding surrounding the head and body of the pancreas. There is no evidence of pancreatic necrosis or hemorrhage. There is no obvious focal mass in the pancreas. Heterogeneity may simply reflect streak artifact. Spleen: Unremarkable. Adrenals/Urinary Tract: Left kidney remains severely atrophic. Adrenal glands are unremarkable. There is hypertrophy of the right kidney. Bladder is within normal limits. Stomach/Bowel: Gastrostomy tube is in place.  There is a foreign body in the stomach that is  unchanged compare with the prior study the colon is decompressed. The appendix is within normal limits and contains contrast. There is no obvious mass in the colon. No evidence of small-bowel obstruction. Vascular/Lymphatic: There is no evidence of aortic aneurysm. Mild atherosclerotic calcifications of the aorta and iliac vasculature are noted. No abnormal retroperitoneal adenopathy. Small para-aortic and mesenteric nodes. Reproductive: Uterus contains a single calcification likely related to an old fibroid. No obvious ovarian mass. Other: There is a small amount of free fluid in the upper pelvis and right lower quadrant of the abdomen associated with distal small bowel loops and the uterus. There is also stranding within the mesenteric fat as well as along the course of the ascending and descending colon. There is stranding of the subcutaneous fat as well. These findings may all simply represent volume overload state. Inflammatory process is not excluded. Musculoskeletal: No vertebral compression deformity. IMPRESSION: Small bilateral pleural effusions and dependent atelectasis. Small amount of free fluid in the lower abdomen and pelvis. This is nonspecific and may be related to volume overload. An inflammatory process is not excluded. Foreign body remains in the stomach Create Gastrostomy tube is in place. There is stranding about the pancreas which may be related to volume overload. A focal inflammatory process is not excluded. Electronically Signed   By: Marybelle Killings M.D.   On: 09/11/2016 12:41   Ct Abdomen Pelvis W Contrast  Result Date: 09/11/2016 CLINICAL DATA:  Sepsis EXAM: CT CHEST, ABDOMEN, AND PELVIS WITH CONTRAST TECHNIQUE: Multidetector CT imaging of the chest, abdomen and pelvis was performed following the standard protocol during bolus administration of intravenous contrast. CONTRAST:  181mL ISOVUE-300 IOPAMIDOL (ISOVUE-300) INJECTION 61% COMPARISON:  06/01/2014 FINDINGS: CT CHEST FINDINGS  Cardiovascular: There is no evidence of aortic aneurysm, dissection, or intramural hematoma. Mild coronary artery calcifications in the LAD territory. Mitral annular calcification. Mediastinum/Nodes: No evidence of abnormal mediastinal adenopathy no pericardial effusion. No abnormal axillary adenopathy. Lungs/Pleura: Small bilateral pleural effusions. No pneumothorax. There is dependent atelectasis bilaterally. Hazy ground-glass throughout the remainder of the lungs likely reflects hypoaeration. Musculoskeletal: There is no vertebral compression deformity in the thoracic spine. CT ABDOMEN PELVIS FINDINGS Hepatobiliary: Diffuse hepatic steatosis.  Postcholecystectomy. Pancreas: There is stranding surrounding the head and body of the pancreas. There is no evidence of pancreatic necrosis or hemorrhage. There is no obvious focal mass in the pancreas. Heterogeneity may simply reflect streak artifact. Spleen: Unremarkable. Adrenals/Urinary Tract: Left kidney remains severely atrophic. Adrenal glands are unremarkable. There is hypertrophy of the right kidney. Bladder is within normal limits. Stomach/Bowel: Gastrostomy tube is in place. There is a foreign body in the stomach that is unchanged compare with the prior study the colon is decompressed. The appendix is within normal limits and contains contrast. There is no obvious mass in the colon. No evidence of small-bowel obstruction. Vascular/Lymphatic: There is no evidence of aortic aneurysm. Mild atherosclerotic calcifications of the aorta and iliac vasculature are noted. No abnormal retroperitoneal adenopathy. Small para-aortic and mesenteric nodes. Reproductive: Uterus contains a single calcification likely related to an old fibroid. No obvious ovarian mass. Other: There is a small amount of free fluid in the upper pelvis and right lower quadrant of the abdomen associated with distal small bowel loops and the uterus. There is also stranding within the mesenteric fat as  well as along the course of the ascending and descending colon. There is stranding of the subcutaneous fat as well. These  findings may all simply represent volume overload state. Inflammatory process is not excluded. Musculoskeletal: No vertebral compression deformity. IMPRESSION: Small bilateral pleural effusions and dependent atelectasis. Small amount of free fluid in the lower abdomen and pelvis. This is nonspecific and may be related to volume overload. An inflammatory process is not excluded. Foreign body remains in the stomach Create Gastrostomy tube is in place. There is stranding about the pancreas which may be related to volume overload. A focal inflammatory process is not excluded. Electronically Signed   By: Marybelle Killings M.D.   On: 09/11/2016 12:41   Dg Chest Portable 1 View  Result Date: 09/08/2016 CLINICAL DATA:  70 year old female with a history of sepsis EXAM: PORTABLE CHEST 1 VIEW COMPARISON:  06/03/2016, 02/08/2016, 11/11/2014 FINDINGS: Significant right rotation somewhat limits evaluation. Low lung volumes accentuating the interstitium. Interstitial opacities bilaterally. No pneumothorax identified.  No large pleural effusion. No large confluent airspace disease. IMPRESSION: Limited exam given the positioning with low lung volumes and likely atelectasis. No definite lobar pneumonia. Signed, Dulcy Fanny. Earleen Newport, DO [facility] Radiology Electronically Signed   By: Corrie Mckusick D.O.   On: 09/08/2016 13:31   US Abdomen Limited Ruq  Result Date: 09/09/2016 CLINICAL DATA:  Sepsis.  Prior cholecystectomy. EXAM: US ABDOMEN LIMITED - RIGHT UPPER QUADRANT COMPARISON:  None. FINDINGS: Limited exam due to body habitus. Gallbladder: Surgically absent. Common bile duct: Diameter: 2.3 mm Liver: Diffusely increased in echogenicity. IMPRESSION: Surgically absent gallbladder.  No biliary ductal dilatation. Hepatic steatosis. Electronically Signed   By: Lovey Newcomer M.D.   On: 09/09/2016 10:44    Time Spent in minutes  35   Louellen Molder M.D on 09/12/2016 at 1:37 PM  Between 7am to 7pm - Pager - [phone]  After 7pm go to [email or web address] - password [facility]  [facility] -  Office  [phone]

## 2016-09-12 NOTE — [place] FL2 (Signed)
[place] MEDICAID FL2 LEVEL OF CARE SCREENING TOOL     IDENTIFICATION  Patient Name: Dawn [name] Birthdate: 08-01-1946 Sex: female Admission Date (Current Location): 09/08/2016  [facility] and [place] Number:  Herbalist and Address:  [facility],  [place] [address], [place]      Provider Number: [id number]  Attending Physician Name and Address:  Louellen Molder, MD  Relative Name and Phone Number:       Current Level of Care: Hospital Recommended Level of Care: [place] Prior Approval Number:    Date Approved/Denied:   PASRR Number: [id number]  Discharge Plan: SNF    Current Diagnoses: Patient Active Problem List   Diagnosis Date Noted  . Leukocytosis 07/26/2014  . Sepsis ([place]) 06/12/2014  . PEG (percutaneous endoscopic gastrostomy) status ([place]) 06/12/2014  . Seizure disorder ([facility]) 10/28/2013  . Hypothyroidism 10/28/2013    Orientation RESPIRATION BLADDER Height & Weight     Self  Normal Incontinent Weight: 149 lb 4 oz (67.7 kg) Height:  5' (152.4 cm)  BEHAVIORAL SYMPTOMS/MOOD NEUROLOGICAL BOWEL NUTRITION STATUS      Incontinent Feeding tube  AMBULATORY STATUS COMMUNICATION OF NEEDS Skin   Extensive Assist Verbally Normal                       Personal Care Assistance Level of Assistance  Bathing, Feeding, Dressing Bathing Assistance: Maximum assistance Feeding assistance: Limited assistance Dressing Assistance: Maximum assistance     Functional Limitations Info             SPECIAL CARE FACTORS FREQUENCY                       Contractures      Additional Factors Info  Code Status, Allergies Code Status Info: DNR Allergies Info: Carvedilol, Ppd Tuberculin Purified Protein Derivative           Current Medications (09/12/2016):  This is the current hospital active medication list Current Facility-Administered Medications  Medication Dose Route Frequency Provider Last Rate  Last Dose  . 0.9 %  sodium chloride infusion  250 mL Intravenous PRN Velvet Bathe, MD      . 0.9 %  sodium chloride infusion   Intravenous Continuous Robbie Lis, MD 100 mL/hr at 09/12/16 0353    . acetaminophen (TYLENOL) tablet 650 mg  650 mg Oral Q6H PRN Velvet Bathe, MD       Or  . acetaminophen (TYLENOL) suppository 650 mg  650 mg Rectal Q6H PRN Velvet Bathe, MD      . albuterol (PROVENTIL) (2.5 MG/3ML) 0.083% nebulizer solution 2.5 mg  2.5 mg Nebulization Q6H PRN Velvet Bathe, MD      . chlorhexidine (PERIDEX) 0.12 % solution 15 mL  15 mL Mouth Rinse BID Velvet Bathe, MD   15 mL at 09/11/16 2202  . clonazePAM (KLONOPIN) tablet 1 mg  1 mg Per Tube Q8H Velvet Bathe, MD   1 mg at 09/12/16 0651  . docusate (COLACE) 50 MG/5ML liquid 200 mg  200 mg Per Tube BID Velvet Bathe, MD   200 mg at 09/11/16 2208  . feeding supplement (PRO-STAT SUGAR FREE 64) liquid 30 mL  30 mL Per Tube Daily Velvet Bathe, MD   30 mL at 09/12/16 0935  . free water 100 mL  100 mL Per Tube Q4H Velvet Bathe, MD   100 mL at 09/12/16 0354  . iopamidol (ISOVUE-300) 61 % injection 15  mL  15 mL Oral Once PRN Robbie Lis, MD   15 mL at 09/11/16 0847  . levETIRAcetam (KEPPRA) 100 MG/ML solution 500 mg  500 mg Per Tube Q0600 Velvet Bathe, MD   500 mg at 09/12/16 0651  . levothyroxine (SYNTHROID, LEVOTHROID) tablet 100 mcg  100 mcg Oral QAC breakfast Velvet Bathe, MD   100 mcg at 09/12/16 0839  . MEDLINE mouth rinse  15 mL Mouth Rinse q12n4p Velvet Bathe, MD   15 mL at 09/11/16 1600  . piperacillin-tazobactam (ZOSYN) IVPB 3.375 g  3.375 g Intravenous Q8H Duffy Bruce, MD 12.5 mL/hr at 09/12/16 0353 3.375 g at 09/12/16 0353  . ranitidine (ZANTAC) 150 MG/10ML syrup 150 mg  150 mg Per Tube QHS Robbie Lis, MD   150 mg at 09/11/16 2208  . sodium chloride 0.9 % bolus 2,000 mL  2,000 mL Intravenous Once Nishant Dhungel, MD      . sodium chloride flush (NS) 0.9 % injection 3 mL  3 mL Intravenous Q12H Velvet Bathe, MD   3 mL at  09/11/16 2208  . sodium chloride flush (NS) 0.9 % injection 3 mL  3 mL Intravenous PRN Velvet Bathe, MD      . Dominic Pea HN liquid 1,000 mL  1,000 mL Per Tube Q24H Robbie Lis, MD   1,000 mL at 09/11/16 2202  . Valproate Sodium (DEPAKENE) solution 750 mg  750 mg Per Tube TID Velvet Bathe, MD   750 mg at 09/12/16 0935  . vancomycin (VANCOCIN) IVPB 750 mg/150 ml premix  750 mg Intravenous Q12H Anh P Pham, RPH   750 mg at 09/11/16 2330     Discharge Medications: Please see discharge summary for a list of discharge medications.  Relevant Imaging Results:  Relevant Lab Results:   Additional Information SSN: [id number]  Standley Brooking, LCSW

## 2016-09-12 NOTE — Progress Notes (Signed)
CRITICAL VALUE ALERT  Critical value received:  Lactic acid 5.5  Date of notification:  09/12/16  Time of notification:  0930  Critical value read back: yes, Tammy  Nurse who received alert:  Xitlally Mooneyham  MD notified (1st page):  dhungel  Time of first page:  Told at bedside  MD notified (2nd page):  Time of second page:  Responding MD:  dhungel  Time MD responded:

## 2016-09-12 NOTE — Progress Notes (Signed)
Upon entering room, RN noticed patient was coughing, breathing had become labored.  Provider contacted and then came to assess patient. Orders received to administer Lasix 60 mg IV and to place a Foley cathter. Medication were given and foley placed. Will continue to monitor.

## 2016-09-12 NOTE — Clinical Social Work Note (Signed)
Clinical Social Work Assessment  Patient Details  Name: Dawn [name] MRN: [id number] Date of Birth: 1945-11-22  Date of referral:  09/12/16               Reason for consult:  Facility Placement                Permission sought to share information with:  Facility Art therapist granted to share information::  Yes, Verbal Permission Granted  Name::        Agency::     Relationship::     Contact Information:     Housing/Transportation Living arrangements for the past 2 months:  [place] of Information:  Other (Comment Required) (sister, Dawn [name] via phone) Patient Interpreter Needed:  None Criminal Activity/Legal Involvement Pertinent to Current Situation/Hospitalization:  No - Comment as needed Significant Relationships:  Siblings Lives with:  Facility Resident Do you feel safe going back to the place where you live?  Yes Need for family participation in patient care:  Yes (Comment)  Care giving concerns:  CSW received consult that patient was admitted from [facility].    Social Worker assessment / plan:  CSW spoke with patient's sister, Dawn [name] (ph#: [phone]) re: discharge planning.   Employment status:  Retired Forensic scientist:  Medicare PT Recommendations:  Not assessed at this time Information / Referral to community resources:  [place]  Patient/Family's Response to care:  Patient's sister is agreeable with her returning to [facility] when ready for discharge.   Patient/Family's Understanding of and Emotional Response to Diagnosis, Current Treatment, and Prognosis:    Emotional Assessment Appearance:  Appears stated age Attitude/Demeanor/Rapport:    Affect (typically observed):    Orientation:  Self only Alcohol / Substance use:    Psych involvement (Current and /or in the community):     Discharge Needs  Concerns to be addressed:    Readmission within the last 30 days:    Current  discharge risk:    Barriers to Discharge:      Standley Brooking, LCSW 09/12/2016, 10:33 AM

## 2016-09-12 NOTE — Care Management Important Message (Signed)
Important Message  Patient Details  Name: CAEDYN COGDILL MRN: [id number] Date of Birth: February 05, 1946   Medicare Important Message Given:  Yes    Camillo Flaming 09/12/2016, 10:40 AMImportant Message  Patient Details  Name: ZAHIRA FOLKS MRN: [id number] Date of Birth: 1946-09-12   Medicare Important Message Given:  Yes    Camillo Flaming 09/12/2016, 10:40 AM

## 2016-09-13 ENCOUNTER — Inpatient Hospital Stay (HOSPITAL_COMMUNITY)
Admit: 2016-09-13 | Discharge: 2016-09-13 | Disposition: A | Discharge status: Home / Self Care | Payer: Medicare Other | Attending: Internal Medicine | Admitting: Internal Medicine

## 2016-09-13 DIAGNOSIS — J9601 Acute respiratory failure with hypoxia: Secondary | ICD-10-CM | POA: Diagnosis present

## 2016-09-13 LAB — CBC
HCT: 32.2 % — ABNORMAL LOW (ref 36.0–46.0)
Hemoglobin: 10.6 g/dL — ABNORMAL LOW (ref 12.0–15.0)
MCH: 33.2 pg (ref 26.0–34.0)
MCHC: 32.9 g/dL (ref 30.0–36.0)
MCV: 100.9 fL — ABNORMAL HIGH (ref 78.0–100.0)
PLATELETS: 300 10*3/uL (ref 150–400)
RBC: 3.19 MIL/uL — AB (ref 3.87–5.11)
RDW: 16.3 % — AB (ref 11.5–15.5)
WBC: 15.7 10*3/uL — AB (ref 4.0–10.5)

## 2016-09-13 LAB — RESPIRATORY PANEL BY PCR
Adenovirus: NOT DETECTED
BORDETELLA PERTUSSIS-RVPCR: NOT DETECTED
CHLAMYDOPHILA PNEUMONIAE-RVPPCR: NOT DETECTED
Coronavirus 229E: NOT DETECTED
Coronavirus HKU1: NOT DETECTED
Coronavirus NL63: NOT DETECTED
Coronavirus OC43: NOT DETECTED
INFLUENZA A-RVPPCR: NOT DETECTED
Influenza B: NOT DETECTED
METAPNEUMOVIRUS-RVPPCR: NOT DETECTED
Mycoplasma pneumoniae: NOT DETECTED
PARAINFLUENZA VIRUS 2-RVPPCR: NOT DETECTED
PARAINFLUENZA VIRUS 3-RVPPCR: NOT DETECTED
PARAINFLUENZA VIRUS 4-RVPPCR: NOT DETECTED
Parainfluenza Virus 1: NOT DETECTED
RESPIRATORY SYNCYTIAL VIRUS-RVPPCR: NOT DETECTED
RHINOVIRUS / ENTEROVIRUS - RVPPCR: NOT DETECTED

## 2016-09-13 LAB — CULTURE, BLOOD (ROUTINE X 2): Culture: NO GROWTH

## 2016-09-13 LAB — VALPROIC ACID LEVEL: VALPROIC ACID LVL: 79 ug/mL (ref 50.0–100.0)

## 2016-09-13 MED ORDER — FUROSEMIDE 10 MG/ML IJ SOLN
60.0000 mg | Freq: Once | INTRAMUSCULAR | Status: AC
  Administered 2016-09-13: 60 mg via INTRAVENOUS
  Filled 2016-09-13: qty 6

## 2016-09-13 MED ORDER — FUROSEMIDE 10 MG/ML IJ SOLN
40.0000 mg | Freq: Two times a day (BID) | INTRAMUSCULAR | Status: DC
  Administered 2016-09-13 – 2016-09-14 (×2): 40 mg via INTRAVENOUS
  Filled 2016-09-13 (×2): qty 4

## 2016-09-13 MED ORDER — POTASSIUM CHLORIDE 20 MEQ/15ML (10%) PO SOLN
40.0000 meq | Freq: Every day | ORAL | Status: DC
  Administered 2016-09-13 – 2016-09-17 (×5): 40 meq via ORAL
  Filled 2016-09-13 (×5): qty 30

## 2016-09-13 NOTE — Progress Notes (Signed)
PROGRESS NOTE                                                                                                                                                                                                             Patient Demographics:    Dawn [name], is a 70 y.o. female, DOB - [birth year], [id number]  Admit date - 09/08/2016   Admitting Physician Velvet Bathe, MD  Outpatient Primary MD for the patient is Inocencio Homes, MD  LOS - 5  Outpatient Specialists: none  Chief Complaint  Patient presents with  . Altered Mental Status  . Code Sepsis  . Hyperventilating       Brief Narrative   70 year old female with developmental delay, seizure disorder, dementia, A. fib, hypothyroidism who was sent from SNF with fever. She was hospitalized in August this year with aspiration pneumonia. Patient septic in the ED with hypotension, tachycardia, tachypnea and fever of 103.36F. Blood work showed WBC of 20 7.8K, lactic acid of 8.3 and potassium of 5.5. She was started on empiric vancomycin and Zosyn. Patient admitted to stepdown unit and transferred to medical floor 2 days later.    Subjective:   Patient afebrile but again became short of breath this morning. Ordered scheduled IV Lasix. Had good urine output yesterday  Assessment  & Plan :    Principal Problem:  Severe Sepsis ([place]) Source unclear. Empiric IV vancomycin and Zosyn. (Day 6). Blood culture 1/2 again growing staph, sensitivity pending. Respiratory viral panel negative. LFTs normal. -Lactate is persistently elevated and has high normal pro calcitonin. Check Depakote level, CPK and EEG to rule out active seizures. 2-D echo pending, to rule out vegetation. CT chest abdomen and pelvis  shows small amount of intra-abdominal fluids which is unremarkable. Abdominal ultrasound on admission unremarkable as well. -As per sister she has poor mentation and is  unchanged. -If symptoms unimproved will consult PC CM.  Active Problems: Acute respiratory failure with hypoxia He continued to volume overload. Chest x-ray on 11/29 showed bibasilar atelectasis only. Monitor with diuresis. Check Ddimer. Follow 2-D echo.    Seizure disorder (HCC) Continue Keppra, Depakote and clonazepam.    Hypothyroidism Continue Synthroid.  Essential hypertension Metoprolol on hold due to hypotension.  Hypokalemia Replenished    Code Status : DO NOT RESUSCITATE  Family Communication  : None at bedside  Disposition Plan : return to SNF once improved  Barriers For Discharge : Active symptoms  Consults  :  None  Procedures  : Ultrasound abdomen CT chest abdomen and pelvis  DVT Prophylaxis  :  Lovenox -   Lab Results  Component Value Date   PLT 300 09/13/2016    Antibiotics  :   Anti-infectives    Start     Dose/Rate Route Frequency Ordered Stop   09/11/16 1100  vancomycin (VANCOCIN) IVPB 750 mg/150 ml premix     750 mg 150 mL/hr over 60 Minutes Intravenous Every 12 hours 09/11/16 1035     09/09/16 2100  vancomycin (VANCOCIN) 500 mg in sodium chloride 0.9 % 100 mL IVPB  Status:  Discontinued     500 mg 100 mL/hr over 60 Minutes Intravenous Every 12 hours 09/09/16 2050 09/11/16 1035   09/09/16 0200  vancomycin (VANCOCIN) 500 mg in sodium chloride 0.9 % 100 mL IVPB  Status:  Discontinued     500 mg 100 mL/hr over 60 Minutes Intravenous Every 12 hours 09/08/16 1437 09/09/16 1123   09/08/16 2000  piperacillin-tazobactam (ZOSYN) IVPB 3.375 g     3.375 g 12.5 mL/hr over 240 Minutes Intravenous Every 8 hours 09/08/16 1437     09/08/16 1345  piperacillin-tazobactam (ZOSYN) IVPB 3.375 g     3.375 g 100 mL/hr over 30 Minutes Intravenous  Once 09/08/16 1340 09/08/16 1415   09/08/16 1345  vancomycin (VANCOCIN) IVPB 1000 mg/200 mL premix     1,000 mg 200 mL/hr over 60 Minutes Intravenous STAT 09/08/16 1340 09/08/16 1453        Objective:    Vitals:   09/12/16 0511 09/12/16 0942 09/12/16 2037 09/13/16 0650  BP: (!) 125/56 (!) 134/59 (!) 113/54 (!) 110/57  Pulse: (!) 101 (!) 102 (!) 111 (!) 111  Resp: 18 18 14 20   Temp: 98.5 F (36.9 C)  99.3 F (37.4 C) 99.4 F (37.4 C)  TempSrc: Axillary  Oral Oral  SpO2: 98% 97% 99% 96%  Weight:      Height:        Wt Readings from Last 3 Encounters:  09/08/16 67.7 kg (149 lb 4 oz)  08/23/16 65.4 kg (144 lb 3.2 oz)  07/25/16 63.5 kg (140 lb)     Intake/Output Summary (Last 24 hours) at 09/13/16 1318 Last data filed at 09/13/16 1312  Gross per 24 hour  Intake           1634.9 ml  Output             3075 ml  Net          -1440.1 ml     Physical Exam  [id number], tachypneic HEENT: Moist mucosa, supple neck, JVD + Chest: Coarse breath sounds bilaterally CVS: S1 and S2 tachycardic, no murmurs or gallop GI: soft, NT, ND, BS+, G-tube in place Foley placed Musculoskeletal: warm , 1+ pitting edema bilaterally CNS: Awake to common, non-verbal,    Data Review:    CBC  Recent Labs Lab 09/08/16 1309  09/10/16 0340 09/11/16 0335 09/12/16 0515 09/12/16 1111 09/13/16 0514  WBC 27.8*  < > 23.7* 16.6* 16.2* 14.1* 15.7*  HGB 14.1  < > 10.3* 10.2* 10.4* 10.0* 10.6*  HCT 41.3  < > 31.3* 31.2* 32.2* 30.1* 32.2*  PLT 433*  < > 311 278 297 292 300  MCV 99.5  < > 101.3* 102.3* 101.3* 101.3* 100.9*  MCH 34.0  < > 33.3 33.4 32.7 33.7 33.2  MCHC 34.1  < > 32.9 32.7 32.3 33.2 32.9  RDW 16.0*  < > 15.9* 16.1* 16.1* 16.1* 16.3*  LYMPHSABS 5.0*  --   --   --   --   --   --   MONOABS 2.8*  --   --   --   --   --   --   EOSABS 0.0  --   --   --   --   --   --   BASOSABS 0.0  --   --   --   --   --   --   < > = values in this interval not displayed.  Chemistries   Recent Labs Lab 09/08/16 1309 09/09/16 0354 09/10/16 0340 09/11/16 0335 09/12/16 1353  NA 133* 136 139 142 138  K 5.5* 4.0 3.7 3.5 3.3*  CL 98* 103 104 108 107  CO2 22 25 25 29 24   GLUCOSE 115* 93 161*  146* 119*  BUN 25* 22* 20 18 13   CREATININE 0.87 0.72 0.68 0.55 0.55  CALCIUM 8.2* 8.1* 8.2* 8.3* 8.2*  AST 161*  --   --   --  98*  ALT 54  --   --   --  43  ALKPHOS 347*  --   --   --  394*  BILITOT 2.1*  --   --   --  1.5*   ------------------------------------------------------------------------------------------------------------------ No results for input(s): CHOL, HDL, LDLCALC, TRIG, CHOLHDL, LDLDIRECT in the last 72 hours.  No results found for: HGBA1C ------------------------------------------------------------------------------------------------------------------ No results for input(s): TSH, T4TOTAL, T3FREE, THYROIDAB in the last 72 hours.  Invalid input(s): FREET3 ------------------------------------------------------------------------------------------------------------------ No results for input(s): VITAMINB12, FOLATE, FERRITIN, TIBC, IRON, RETICCTPCT in the last 72 hours.  Coagulation profile  Recent Labs Lab 09/08/16 1450  INR 1.31    No results for input(s): DDIMER in the last 72 hours.  Cardiac Enzymes No results for input(s): CKMB, TROPONINI, MYOGLOBIN in the last 168 hours.  Invalid input(s): CK ------------------------------------------------------------------------------------------------------------------    Component Value Date/Time   BNP 257.3 (H) 10/27/2014 0015    Inpatient Medications  Scheduled Meds: . chlorhexidine  15 mL Mouth Rinse BID  . clonazePAM  1 mg Per Tube Q8H  . docusate  200 mg Per Tube BID  . feeding supplement (PRO-STAT SUGAR FREE 64)  30 mL Per Tube Daily  . free water  100 mL Per Tube Q4H  . furosemide  40 mg Intravenous Q12H  . levETIRAcetam  500 mg Per Tube Q0600  . levothyroxine  100 mcg Oral QAC breakfast  . mouth rinse  15 mL Mouth Rinse q12n4p  . piperacillin-tazobactam (ZOSYN)  IV  3.375 g Intravenous Q8H  . potassium chloride  40 mEq Oral Daily  . ranitidine  150 mg Per Tube QHS  . sodium chloride flush  3 mL  Intravenous Q12H  . TWOCAL HN  1,000 mL Per Tube Q24H  . Valproate Sodium  750 mg Per Tube TID  . vancomycin  750 mg Intravenous Q12H   Continuous Infusions: PRN Meds:.sodium chloride, acetaminophen **OR** acetaminophen, albuterol, iopamidol, sodium chloride flush  Micro Results Recent Results (from the past 240 hour(s))  Culture, blood (Routine x 2)     Status: None (Preliminary result)   Collection Time: 09/08/16  1:06 PM  Result Value Ref Range Status   Specimen Description BLOOD LEFT HAND  Final   Special Requests BOTTLES DRAWN AEROBIC AND ANAEROBIC 5CC  Final   Culture   Final  NO GROWTH 4 DAYS Performed at [facility]    Report Status PENDING  Incomplete  Culture, blood (Routine x 2)     Status: Abnormal   Collection Time: 09/08/16  1:09 PM  Result Value Ref Range Status   Specimen Description BLOOD RIGHT ANTECUBITAL  Final   Special Requests BOTTLES DRAWN AEROBIC AND ANAEROBIC 5CC  Final   Culture  Setup Time   Final    GRAM POSITIVE COCCI IN CLUSTERS IN BOTH AEROBIC AND ANAEROBIC BOTTLES CRITICAL RESULT CALLED TO, READ BACK BY AND VERIFIED WITH: N GLOGOVAC,PHARMD AT 1253 09/09/16 BY L BENFIELD    Culture (A)  Final    STAPHYLOCOCCUS SPECIES (COAGULASE NEGATIVE) THE SIGNIFICANCE OF ISOLATING THIS ORGANISM FROM A SINGLE SET OF BLOOD CULTURES WHEN MULTIPLE SETS ARE DRAWN IS UNCERTAIN. PLEASE NOTIFY THE MICROBIOLOGY DEPARTMENT WITHIN ONE WEEK IF SPECIATION AND SENSITIVITIES ARE REQUIRED. Performed at [facility]    Report Status 09/11/2016 FINAL  Final  Blood Culture ID Panel (Reflexed)     Status: Abnormal   Collection Time: 09/08/16  1:09 PM  Result Value Ref Range Status   Enterococcus species NOT DETECTED NOT DETECTED Final   Listeria monocytogenes NOT DETECTED NOT DETECTED Final   Staphylococcus species DETECTED (A) NOT DETECTED Final    Comment: CRITICAL RESULT CALLED TO, READ BACK BY AND VERIFIED WITH: N GLOGOVAC,PHARMD AT 1253 09/09/16 BY L  BENFIELD    Staphylococcus aureus NOT DETECTED NOT DETECTED Final   Methicillin resistance NOT DETECTED NOT DETECTED Final   Streptococcus species NOT DETECTED NOT DETECTED Final   Streptococcus agalactiae NOT DETECTED NOT DETECTED Final   Streptococcus pneumoniae NOT DETECTED NOT DETECTED Final   Streptococcus pyogenes NOT DETECTED NOT DETECTED Final   Acinetobacter baumannii NOT DETECTED NOT DETECTED Final   Enterobacteriaceae species NOT DETECTED NOT DETECTED Final   Enterobacter cloacae complex NOT DETECTED NOT DETECTED Final   Escherichia coli NOT DETECTED NOT DETECTED Final   Klebsiella oxytoca NOT DETECTED NOT DETECTED Final   Klebsiella pneumoniae NOT DETECTED NOT DETECTED Final   Proteus species NOT DETECTED NOT DETECTED Final   Serratia marcescens NOT DETECTED NOT DETECTED Final   Haemophilus influenzae NOT DETECTED NOT DETECTED Final   Neisseria meningitidis NOT DETECTED NOT DETECTED Final   Pseudomonas aeruginosa NOT DETECTED NOT DETECTED Final   Candida albicans NOT DETECTED NOT DETECTED Final   Candida glabrata NOT DETECTED NOT DETECTED Final   Candida krusei NOT DETECTED NOT DETECTED Final   Candida parapsilosis NOT DETECTED NOT DETECTED Final   Candida tropicalis NOT DETECTED NOT DETECTED Final    Comment: Performed at [facility]  Urine culture     Status: None   Collection Time: 09/08/16  3:00 PM  Result Value Ref Range Status   Specimen Description URINE, CATHETERIZED  Final   Special Requests NONE  Final   Culture NO GROWTH Performed at [facility]   Final   Report Status 09/09/2016 FINAL  Final  MRSA PCR Screening     Status: None   Collection Time: 09/08/16 10:21 PM  Result Value Ref Range Status   MRSA by PCR NEGATIVE NEGATIVE Final    Comment:        The GeneXpert MRSA Assay (FDA approved for NASAL specimens only), is one component of a comprehensive MRSA colonization surveillance program. It is not intended to diagnose  MRSA infection nor to guide or monitor treatment for MRSA infections.   Respiratory Panel by PCR  Status: None   Collection Time: 09/09/16  9:43 AM  Result Value Ref Range Status   Adenovirus NOT DETECTED NOT DETECTED Final   Coronavirus 229E NOT DETECTED NOT DETECTED Final   Coronavirus HKU1 NOT DETECTED NOT DETECTED Final   Coronavirus NL63 NOT DETECTED NOT DETECTED Final   Coronavirus OC43 NOT DETECTED NOT DETECTED Final   Metapneumovirus NOT DETECTED NOT DETECTED Final   Rhinovirus / Enterovirus NOT DETECTED NOT DETECTED Final   Influenza A NOT DETECTED NOT DETECTED Final   Influenza B NOT DETECTED NOT DETECTED Final   Parainfluenza Virus 1 NOT DETECTED NOT DETECTED Final   Parainfluenza Virus 2 NOT DETECTED NOT DETECTED Final   Parainfluenza Virus 3 NOT DETECTED NOT DETECTED Final   Parainfluenza Virus 4 NOT DETECTED NOT DETECTED Final   Respiratory Syncytial Virus NOT DETECTED NOT DETECTED Final   Bordetella pertussis NOT DETECTED NOT DETECTED Final   Chlamydophila pneumoniae NOT DETECTED NOT DETECTED Final   Mycoplasma pneumoniae NOT DETECTED NOT DETECTED Final    Comment: Performed at [facility]  Culture, blood (routine x 2)     Status: None (Preliminary result)   Collection Time: 09/10/16 10:29 AM  Result Value Ref Range Status   Specimen Description BLOOD RIGHT HAND  Final   Special Requests IN PEDIATRIC BOTTLE 4CC  Final   Culture  Setup Time   Final    GRAM POSITIVE COCCI IN CLUSTERS IN PEDIATRIC BOTTLE CRITICAL RESULT CALLED TO, READ BACK BY AND VERIFIED WITH: Chales Abrahams, PHARM AT [place] ON [id number] BY Rhea Bleacher    Culture   Final    GRAM POSITIVE COCCI REPEATING ID AND SENSITIVITIES TO VERIFY Performed at [facility]    Report Status PENDING  Incomplete  Culture, blood (routine x 2)     Status: None (Preliminary result)   Collection Time: 09/10/16 10:37 AM  Result Value Ref Range Status   Specimen Description BLOOD LEFT HAND  Final   Special  Requests BOTTLES DRAWN AEROBIC ONLY 5CC  Final   Culture   Final    NO GROWTH 2 DAYS Performed at [facility]    Report Status PENDING  Incomplete  Respiratory Panel by PCR     Status: None   Collection Time: 09/13/16  8:14 AM  Result Value Ref Range Status   Adenovirus NOT DETECTED NOT DETECTED Final   Coronavirus 229E NOT DETECTED NOT DETECTED Final   Coronavirus HKU1 NOT DETECTED NOT DETECTED Final   Coronavirus NL63 NOT DETECTED NOT DETECTED Final   Coronavirus OC43 NOT DETECTED NOT DETECTED Final   Metapneumovirus NOT DETECTED NOT DETECTED Final   Rhinovirus / Enterovirus NOT DETECTED NOT DETECTED Final   Influenza A NOT DETECTED NOT DETECTED Final   Influenza B NOT DETECTED NOT DETECTED Final   Parainfluenza Virus 1 NOT DETECTED NOT DETECTED Final   Parainfluenza Virus 2 NOT DETECTED NOT DETECTED Final   Parainfluenza Virus 3 NOT DETECTED NOT DETECTED Final   Parainfluenza Virus 4 NOT DETECTED NOT DETECTED Final   Respiratory Syncytial Virus NOT DETECTED NOT DETECTED Final   Bordetella pertussis NOT DETECTED NOT DETECTED Final   Chlamydophila pneumoniae NOT DETECTED NOT DETECTED Final   Mycoplasma pneumoniae NOT DETECTED NOT DETECTED Final    Comment: Performed at [facility]    Radiology Reports Ct Chest W Contrast  Result Date: 09/11/2016 CLINICAL DATA:  Sepsis EXAM: CT CHEST, ABDOMEN, AND PELVIS WITH CONTRAST TECHNIQUE: Multidetector CT imaging of the chest, abdomen and pelvis  was performed following the standard protocol during bolus administration of intravenous contrast. CONTRAST:  142mL ISOVUE-300 IOPAMIDOL (ISOVUE-300) INJECTION 61% COMPARISON:  06/01/2014 FINDINGS: CT CHEST FINDINGS Cardiovascular: There is no evidence of aortic aneurysm, dissection, or intramural hematoma. Mild coronary artery calcifications in the LAD territory. Mitral annular calcification. Mediastinum/Nodes: No evidence of abnormal mediastinal adenopathy no pericardial effusion. No  abnormal axillary adenopathy. Lungs/Pleura: Small bilateral pleural effusions. No pneumothorax. There is dependent atelectasis bilaterally. Hazy ground-glass throughout the remainder of the lungs likely reflects hypoaeration. Musculoskeletal: There is no vertebral compression deformity in the thoracic spine. CT ABDOMEN PELVIS FINDINGS Hepatobiliary: Diffuse hepatic steatosis.  Postcholecystectomy. Pancreas: There is stranding surrounding the head and body of the pancreas. There is no evidence of pancreatic necrosis or hemorrhage. There is no obvious focal mass in the pancreas. Heterogeneity may simply reflect streak artifact. Spleen: Unremarkable. Adrenals/Urinary Tract: Left kidney remains severely atrophic. Adrenal glands are unremarkable. There is hypertrophy of the right kidney. Bladder is within normal limits. Stomach/Bowel: Gastrostomy tube is in place. There is a foreign body in the stomach that is unchanged compare with the prior study the colon is decompressed. The appendix is within normal limits and contains contrast. There is no obvious mass in the colon. No evidence of small-bowel obstruction. Vascular/Lymphatic: There is no evidence of aortic aneurysm. Mild atherosclerotic calcifications of the aorta and iliac vasculature are noted. No abnormal retroperitoneal adenopathy. Small para-aortic and mesenteric nodes. Reproductive: Uterus contains a single calcification likely related to an old fibroid. No obvious ovarian mass. Other: There is a small amount of free fluid in the upper pelvis and right lower quadrant of the abdomen associated with distal small bowel loops and the uterus. There is also stranding within the mesenteric fat as well as along the course of the ascending and descending colon. There is stranding of the subcutaneous fat as well. These findings may all simply represent volume overload state. Inflammatory process is not excluded. Musculoskeletal: No vertebral compression deformity.  IMPRESSION: Small bilateral pleural effusions and dependent atelectasis. Small amount of free fluid in the lower abdomen and pelvis. This is nonspecific and may be related to volume overload. An inflammatory process is not excluded. Foreign body remains in the stomach Create Gastrostomy tube is in place. There is stranding about the pancreas which may be related to volume overload. A focal inflammatory process is not excluded. Electronically Signed   By: Marybelle Killings M.D.   On: 09/11/2016 12:41   Ct Abdomen Pelvis W Contrast  Result Date: 09/11/2016 CLINICAL DATA:  Sepsis EXAM: CT CHEST, ABDOMEN, AND PELVIS WITH CONTRAST TECHNIQUE: Multidetector CT imaging of the chest, abdomen and pelvis was performed following the standard protocol during bolus administration of intravenous contrast. CONTRAST:  148mL ISOVUE-300 IOPAMIDOL (ISOVUE-300) INJECTION 61% COMPARISON:  06/01/2014 FINDINGS: CT CHEST FINDINGS Cardiovascular: There is no evidence of aortic aneurysm, dissection, or intramural hematoma. Mild coronary artery calcifications in the LAD territory. Mitral annular calcification. Mediastinum/Nodes: No evidence of abnormal mediastinal adenopathy no pericardial effusion. No abnormal axillary adenopathy. Lungs/Pleura: Small bilateral pleural effusions. No pneumothorax. There is dependent atelectasis bilaterally. Hazy ground-glass throughout the remainder of the lungs likely reflects hypoaeration. Musculoskeletal: There is no vertebral compression deformity in the thoracic spine. CT ABDOMEN PELVIS FINDINGS Hepatobiliary: Diffuse hepatic steatosis.  Postcholecystectomy. Pancreas: There is stranding surrounding the head and body of the pancreas. There is no evidence of pancreatic necrosis or hemorrhage. There is no obvious focal mass in the pancreas. Heterogeneity may simply reflect streak artifact. Spleen: Unremarkable.  Adrenals/Urinary Tract: Left kidney remains severely atrophic. Adrenal glands are unremarkable.  There is hypertrophy of the right kidney. Bladder is within normal limits. Stomach/Bowel: Gastrostomy tube is in place. There is a foreign body in the stomach that is unchanged compare with the prior study the colon is decompressed. The appendix is within normal limits and contains contrast. There is no obvious mass in the colon. No evidence of small-bowel obstruction. Vascular/Lymphatic: There is no evidence of aortic aneurysm. Mild atherosclerotic calcifications of the aorta and iliac vasculature are noted. No abnormal retroperitoneal adenopathy. Small para-aortic and mesenteric nodes. Reproductive: Uterus contains a single calcification likely related to an old fibroid. No obvious ovarian mass. Other: There is a small amount of free fluid in the upper pelvis and right lower quadrant of the abdomen associated with distal small bowel loops and the uterus. There is also stranding within the mesenteric fat as well as along the course of the ascending and descending colon. There is stranding of the subcutaneous fat as well. These findings may all simply represent volume overload state. Inflammatory process is not excluded. Musculoskeletal: No vertebral compression deformity. IMPRESSION: Small bilateral pleural effusions and dependent atelectasis. Small amount of free fluid in the lower abdomen and pelvis. This is nonspecific and may be related to volume overload. An inflammatory process is not excluded. Foreign body remains in the stomach Create Gastrostomy tube is in place. There is stranding about the pancreas which may be related to volume overload. A focal inflammatory process is not excluded. Electronically Signed   By: Marybelle Killings M.D.   On: 09/11/2016 12:41   Dg Chest Port 1 View  Result Date: 09/12/2016 CLINICAL DATA:  Shortness of Breath EXAM: PORTABLE CHEST 1 VIEW COMPARISON:  09/11/2016 FINDINGS: Cardiac shadow is mildly enlarged but stable. The overall inspiratory effort is poor with crowding of the  vascular markings. Bibasilar atelectatic changes are seen. The known pleural effusions are not as well appreciated as on the prior CT examination. No bony abnormality is seen. IMPRESSION: Stable bibasilar atelectasis. Overall poor inspiratory effort with crowding of the vascular markings. The known pleural effusions are not well appreciated. Electronically Signed   By: Inez Catalina M.D.   On: 09/12/2016 14:17   Dg Chest Portable 1 View  Result Date: 09/08/2016 CLINICAL DATA:  70 year old female with a history of sepsis EXAM: PORTABLE CHEST 1 VIEW COMPARISON:  06/03/2016, 02/08/2016, 11/11/2014 FINDINGS: Significant right rotation somewhat limits evaluation. Low lung volumes accentuating the interstitium. Interstitial opacities bilaterally. No pneumothorax identified.  No large pleural effusion. No large confluent airspace disease. IMPRESSION: Limited exam given the positioning with low lung volumes and likely atelectasis. No definite lobar pneumonia. Signed, Dulcy Fanny. Earleen Newport, DO [facility] Radiology Electronically Signed   By: Corrie Mckusick D.O.   On: 09/08/2016 13:31   US Abdomen Limited Ruq  Result Date: 09/09/2016 CLINICAL DATA:  Sepsis.  Prior cholecystectomy. EXAM: US ABDOMEN LIMITED - RIGHT UPPER QUADRANT COMPARISON:  None. FINDINGS: Limited exam due to body habitus. Gallbladder: Surgically absent. Common bile duct: Diameter: 2.3 mm Liver: Diffusely increased in echogenicity. IMPRESSION: Surgically absent gallbladder.  No biliary ductal dilatation. Hepatic steatosis. Electronically Signed   By: Lovey Newcomer M.D.   On: 09/09/2016 10:44    Time Spent in minutes  35   Louellen Molder M.D on 09/13/2016 at 1:18 PM  Between 7am to 7pm - Pager - [phone]  After 7pm go to [email or web address] - password [email or web address]  [facility] -  Office  [phone]

## 2016-09-13 NOTE — Progress Notes (Signed)
Nutrition Follow-up  DOCUMENTATION CODES:   Not applicable  INTERVENTION:  - Continue TwoCal HN @ 39 mL/hr with 30 mL Prostat once/day and 100 mL free water every 4 hours. - RD will follow-up 12/4 if pt unable to d/c by that date.   NUTRITION DIAGNOSIS:   Swallowing difficulty related to dysphagia, lethargy/confusion as evidenced by other (see comment) (long-term PEG tube use to meet kcal/protein needs). -ongoing  GOAL:   Patient will meet greater than or equal to 90% of their needs -met with current TF regimen.   MONITOR:   TF tolerance, Weight trends, Labs, Skin, I & O's  ASSESSMENT:   70 y.o. female with medical history significant of seizure disorder, dementia, A. fib, hypothyroidism who presents with fevers from nursing home. The patient is unable to provide history. History is obtained by EMR and ED physician. Found to have SIRS with unknown source of infection (pulmonary suspected).   11/30 Pt is nonverbal and resting at time of RD visit with no family/visitors present at bedside. Pt continues with home TF regimen via PEG without issue: TwoCal HN @ 39 mL/hr with 30 mL Prostat once/day and 100 mL free water every 4 hours which is providing 1972 kcal, 95 grams of protein, and 1264 mL free water which is meeting estimated nutrition needs. No new weight since admission. At time of TF initiation, Pharmacy had 7 1L bottles of TwoCal HN; today pt will receive her sixth bottle.   Medications reviewed; 200 mg Colace BID, 40 mg IV Lasix BID, 60 mg IV Lasix x1 dose yesterday and x1 dose today, 500 mg Keppra per PEG/day, 100 mcg Synthroid per PEG/day, 40 mEq KCl per PEG/DAY. Labs reviewed; K: 3.3 mmol/L, Ca: 8.2 mmol/L, Alk Phos and AST elevated.    11/28 - Pt is nonverbal and resting at time of RD visit with no family/visitors present at bedside.  - Per chart review, no new weight since admission date.  - Pt continues with TwoCal HN @ 39 mL/hr with 30 mL Prostat once/day and 100 mL  free water every 4 hours which is providing 1972 kcal, 95 grams of protein, and 1264 mL free water which is meeting estimated nutrition needs.  - Will continue to monitor weight trends, if possible, during hospitalization to ensure pt is not being overfed.  IVF: NS @ 100 mL/hr. BP: 120/48 and MAP: 76.    11/26 - Noted in chart that patient is nonverbal so was unable to obtain history at bedside.  - No family present.  - Per chart patient is from [facility] and typically receives TwoCalHN via PEG @ 39 ml/hr x 24 hrs + Pro-Stat 30 ml daily.  - Patient receives free water flushes of 100 ml Q4hrs.  - Patient's weight fluctuates in chart between 62.5 and 67.7 kg excluding outliers.  - Will use first admission weight of 65.3 kg to estimate needs as patient has received >2.5 L of fluid since admission. - Access: PEG tube - current PEG placed 04/12/2016 per chart - Nutrition-Focused physical exam completed.  - Findings are nofat depletion, nomuscle depletion.  - Unable to assess patient's back (thoracic/lumbar region and scapular bone region) or edema.  - Discussed with RN. Also spoke with Pharmacy. There are approximately seven 1 L bottles of 2CalHN in stock - patient will require 1 bottle daily.    Diet Order:  Diet Heart Room service appropriate? Yes; Fluid consistency: Thin  Skin:  Reviewed, no issues  Last BM:  11/30  Height:   Ht Readings from Last 1 Encounters:  09/08/16 5' (1.524 m)    Weight:   Wt Readings from Last 1 Encounters:  09/08/16 149 lb 4 oz (67.7 kg)    Ideal Body Weight:  45.45 kg  BMI:  Body mass index is 29.15 kg/m.  Estimated Nutritional Needs:   Kcal:  1800-2000 (~27-30 kcal/kg)  Protein:  85-100 grams (1.3-1.5 grams/kg)  Fluid:  >/= 1.6 L/day (25 ml/kg)  EDUCATION NEEDS:   No education needs identified at this time    Jarome Matin, MS, RD, LDN, CNSC Inpatient Clinical Dietitian Pager # [phone] After hours/weekend pager #  [phone]

## 2016-09-13 NOTE — Procedures (Signed)
Electroencephalogram (EEG) Report  Date of study: 09/13/16  Requesting clinician: Louellen Molder, MD  Reason for study: Evaluate for seizure  Brief clinical history: this is a 61-yo woman with h/o developmental delay, seizure d/o, and dementia admitted with sepsis. She has had persistent change in mentation and EEG is now requested for evaluation.   Medications:  Current Facility-Administered Medications:  .  0.9 %  sodium chloride infusion, 250 mL, Intravenous, PRN, Velvet Bathe, MD .  acetaminophen (TYLENOL) tablet 650 mg, 650 mg, Oral, Q6H PRN **OR** acetaminophen (TYLENOL) suppository 650 mg, 650 mg, Rectal, Q6H PRN, Velvet Bathe, MD, 650 mg at 09/13/16 0706 .  albuterol (PROVENTIL) (2.5 MG/3ML) 0.083% nebulizer solution 2.5 mg, 2.5 mg, Nebulization, Q6H PRN, Velvet Bathe, MD .  chlorhexidine (PERIDEX) 0.12 % solution 15 mL, 15 mL, Mouth Rinse, BID, Velvet Bathe, MD, 15 mL at 09/13/16 1106 .  clonazePAM (KLONOPIN) tablet 1 mg, 1 mg, Per Tube, Q8H, Velvet Bathe, MD, 1 mg at 09/13/16 1305 .  docusate (COLACE) 50 MG/5ML liquid 200 mg, 200 mg, Per Tube, BID, Velvet Bathe, MD, 200 mg at 09/13/16 1106 .  feeding supplement (PRO-STAT SUGAR FREE 64) liquid 30 mL, 30 mL, Per Tube, Daily, Velvet Bathe, MD, 30 mL at 09/13/16 1106 .  free water 100 mL, 100 mL, Per Tube, Q4H, Velvet Bathe, MD, 100 mL at 09/13/16 1600 .  furosemide (LASIX) injection 40 mg, 40 mg, Intravenous, Q12H, Nishant Dhungel, MD, 40 mg at 09/13/16 1815 .  iopamidol (ISOVUE-300) 61 % injection 15 mL, 15 mL, Oral, Once PRN, Robbie Lis, MD, 15 mL at 09/11/16 0847 .  levETIRAcetam (KEPPRA) 100 MG/ML solution 500 mg, 500 mg, Per Tube, Q0600, Velvet Bathe, MD, 500 mg at 09/13/16 0606 .  levothyroxine (SYNTHROID, LEVOTHROID) tablet 100 mcg, 100 mcg, Oral, QAC breakfast, Velvet Bathe, MD, 100 mcg at 09/13/16 0858 .  MEDLINE mouth rinse, 15 mL, Mouth Rinse, q12n4p, Velvet Bathe, MD, 15 mL at 09/13/16 1815 .  piperacillin-tazobactam  (ZOSYN) IVPB 3.375 g, 3.375 g, Intravenous, Q8H, Duffy Bruce, MD, Last Rate: 12.5 mL/hr at 09/13/16 1305, 3.375 g at 09/13/16 1305 .  potassium chloride 20 MEQ/15ML (10%) solution 40 mEq, 40 mEq, Oral, Daily, Nishant Dhungel, MD, 40 mEq at 09/13/16 1106 .  ranitidine (ZANTAC) 150 MG/10ML syrup 150 mg, 150 mg, Per Tube, QHS, Robbie Lis, MD, 150 mg at 09/12/16 2213 .  sodium chloride flush (NS) 0.9 % injection 3 mL, 3 mL, Intravenous, Q12H, Velvet Bathe, MD, 3 mL at 09/13/16 1123 .  sodium chloride flush (NS) 0.9 % injection 3 mL, 3 mL, Intravenous, PRN, Velvet Bathe, MD .  TWOCAL HN liquid 1,000 mL, 1,000 mL, Per Tube, Q24H, Robbie Lis, MD, 1,000 mL at 09/13/16 1124 .  Valproate Sodium (DEPAKENE) solution 750 mg, 750 mg, Per Tube, TID, Velvet Bathe, MD, 750 mg at 09/13/16 1815 .  vancomycin (VANCOCIN) IVPB 750 mg/150 ml premix, 750 mg, Intravenous, Q12H, Anh P Pham, RPH, 750 mg at 09/13/16 1106  Description: This is a routine EEG performed using standard international 10-20 electrode placement. A total of 18 channels are recorded, including one for the EKG. Wakefulness and drowsiness are recorded.   Activating Maneuvers: None  Findings:  The EKG channel demonstrates a regular rhythm with a rate of 110 beats per minute.   The background consists predominately of theta activity with some intermixed delta. There is rare alpha. Voltages are mildly reduced diffusely. The backbround shows limited reactivity.    There  are no focal asymmetries. No epileptiform discharges are present. No seizures are recorded.   Drowsiness is recorded and is notable for an increase in delta.   Impression: This is an abnormal EEG due to mild diffuse generalized slowing. This is consistent with a global encephalopathic process but nonspecific as to etiology. There is no evidence of seizure.    Melba Coon, MD [facility]

## 2016-09-13 NOTE — Progress Notes (Signed)
PROGRESS NOTE                                                                                                                                                                                                             Patient Demographics:    Dawn [name], is a 70 y.o. female, DOB - 1946/03/31, [id number]  Admit date - 09/08/2016   Admitting Physician Velvet Bathe, MD  Outpatient Primary MD for the patient is Inocencio Homes, MD  LOS - 5  Outpatient Specialists: none  Chief Complaint  Patient presents with  . Altered Mental Status  . Code Sepsis  . Hyperventilating       Brief Narrative   70 year old female with developmental delay, seizure disorder, dementia, A. fib, hypothyroidism who was sent from SNF with fever. She was hospitalized in August this year with aspiration pneumonia. Patient septic in the ED with hypotension, tachycardia, tachypnea and fever of 103.31F. Blood work showed WBC of 20 7.8K, lactic acid of 8.3 and potassium of 5.5. She was started on empiric vancomycin and Zosyn. Patient admitted to stepdown unit and transferred to medical floor 2 days later.    Subjective:   Patient afebrile but again became short of breath this morning. Ordered scheduled IV Lasix. Had good urine output yesterday  Assessment  & Plan :    Principal Problem:  Severe Sepsis ([place]) Source unclear. Empiric IV vancomycin and Zosyn. (Day 6). Blood culture 1/2 again growing staph, sensitivity pending. Respiratory viral panel negative. LFTs normal. Lactate continues to be high and [place] tone and is high normal. 2-D echo pending, to rule out vegetation. CT chest abdomen and pelvis which shows small amount of intra-abdominal fluids which is unremarkable. Abdominal ultrasound on admission unremarkable as well. -As per sister she has poor mentation and is unchanged. -If symptoms unimproved will consult PC CM.  Active  Problems: Acute respiratory failure with hypoxia He continued to volume overload. Chest x-ray on 11/29 showed bibasilar atelectasis only. Monitor with diuresis. Check Ddimer. Follow 2-D echo.    Seizure disorder (HCC) Continue Keppra, Depakote and clonazepam.    Hypothyroidism Continue Synthroid.  Essential hypertension Metoprolol on hold due to hypotension.  Hypokalemia Replenished    Code Status : DO NOT RESUSCITATE  Family Communication  : None at bedside  Disposition Plan : return to SNF once improved  Barriers For Discharge : Active symptoms  Consults  :  None  Procedures  : Ultrasound abdomen CT chest abdomen and pelvis  DVT Prophylaxis  :  Lovenox -   Lab Results  Component Value Date   PLT 300 09/13/2016    Antibiotics  :   Anti-infectives    Start     Dose/Rate Route Frequency Ordered Stop   09/11/16 1100  vancomycin (VANCOCIN) IVPB 750 mg/150 ml premix     750 mg 150 mL/hr over 60 Minutes Intravenous Every 12 hours 09/11/16 1035     09/09/16 2100  vancomycin (VANCOCIN) 500 mg in sodium chloride 0.9 % 100 mL IVPB  Status:  Discontinued     500 mg 100 mL/hr over 60 Minutes Intravenous Every 12 hours 09/09/16 2050 09/11/16 1035   09/09/16 0200  vancomycin (VANCOCIN) 500 mg in sodium chloride 0.9 % 100 mL IVPB  Status:  Discontinued     500 mg 100 mL/hr over 60 Minutes Intravenous Every 12 hours 09/08/16 1437 09/09/16 1123   09/08/16 2000  piperacillin-tazobactam (ZOSYN) IVPB 3.375 g     3.375 g 12.5 mL/hr over 240 Minutes Intravenous Every 8 hours 09/08/16 1437     09/08/16 1345  piperacillin-tazobactam (ZOSYN) IVPB 3.375 g     3.375 g 100 mL/hr over 30 Minutes Intravenous  Once 09/08/16 1340 09/08/16 1415   09/08/16 1345  vancomycin (VANCOCIN) IVPB 1000 mg/200 mL premix     1,000 mg 200 mL/hr over 60 Minutes Intravenous STAT 09/08/16 1340 09/08/16 1453        Objective:   Vitals:   09/12/16 0511 09/12/16 0942 09/12/16 2037 09/13/16 0650  BP:  (!) 125/56 (!) 134/59 (!) 113/54 (!) 110/57  Pulse: (!) 101 (!) 102 (!) 111 (!) 111  Resp: 18 18 14 20   Temp: 98.5 F (36.9 C)  99.3 F (37.4 C) 99.4 F (37.4 C)  TempSrc: Axillary  Oral Oral  SpO2: 98% 97% 99% 96%  Weight:      Height:        Wt Readings from Last 3 Encounters:  09/08/16 67.7 kg (149 lb 4 oz)  08/23/16 65.4 kg (144 lb 3.2 oz)  07/25/16 63.5 kg (140 lb)     Intake/Output Summary (Last 24 hours) at 09/13/16 1309 Last data filed at 09/13/16 1106  Gross per 24 hour  Intake           1574.9 ml  Output             3075 ml  Net          -1500.1 ml     Physical Exam  [id number] but arousable, non-verbal HEENT: Moist mucosa, supple neck Chest: Coarse crackles bilaterally with scattered rhonchi CVS: S1 and S2 tachycardic, no murmurs or gallop GI: soft, NT, ND, BS+, G-tube in place Musculoskeletal: warm , 1+ pitting edema bilaterally CNS: Awake to commands but noncommunicative (baseline)    Data Review:    CBC  Recent Labs Lab 09/08/16 1309  09/10/16 0340 09/11/16 0335 09/12/16 0515 09/12/16 1111 09/13/16 0514  WBC 27.8*  < > 23.7* 16.6* 16.2* 14.1* 15.7*  HGB 14.1  < > 10.3* 10.2* 10.4* 10.0* 10.6*  HCT 41.3  < > 31.3* 31.2* 32.2* 30.1* 32.2*  PLT 433*  < > 311 278 297 292 300  MCV 99.5  < > 101.3* 102.3* 101.3* 101.3* 100.9*  MCH 34.0  < > 33.3 33.4 32.7 33.7 33.2  MCHC 34.1  < > 32.9  32.7 32.3 33.2 32.9  RDW 16.0*  < > 15.9* 16.1* 16.1* 16.1* 16.3*  LYMPHSABS 5.0*  --   --   --   --   --   --   MONOABS 2.8*  --   --   --   --   --   --   EOSABS 0.0  --   --   --   --   --   --   BASOSABS 0.0  --   --   --   --   --   --   < > = values in this interval not displayed.  Chemistries   Recent Labs Lab 09/08/16 1309 09/09/16 0354 09/10/16 0340 09/11/16 0335 09/12/16 1353  NA 133* 136 139 142 138  K 5.5* 4.0 3.7 3.5 3.3*  CL 98* 103 104 108 107  CO2 22 25 25 29 24   GLUCOSE 115* 93 161* 146* 119*  BUN 25* 22* 20 18 13   CREATININE  0.87 0.72 0.68 0.55 0.55  CALCIUM 8.2* 8.1* 8.2* 8.3* 8.2*  AST 161*  --   --   --  98*  ALT 54  --   --   --  43  ALKPHOS 347*  --   --   --  394*  BILITOT 2.1*  --   --   --  1.5*   ------------------------------------------------------------------------------------------------------------------ No results for input(s): CHOL, HDL, LDLCALC, TRIG, CHOLHDL, LDLDIRECT in the last 72 hours.  No results found for: HGBA1C ------------------------------------------------------------------------------------------------------------------ No results for input(s): TSH, T4TOTAL, T3FREE, THYROIDAB in the last 72 hours.  Invalid input(s): FREET3 ------------------------------------------------------------------------------------------------------------------ No results for input(s): VITAMINB12, FOLATE, FERRITIN, TIBC, IRON, RETICCTPCT in the last 72 hours.  Coagulation profile  Recent Labs Lab 09/08/16 1450  INR 1.31    No results for input(s): DDIMER in the last 72 hours.  Cardiac Enzymes No results for input(s): CKMB, TROPONINI, MYOGLOBIN in the last 168 hours.  Invalid input(s): CK ------------------------------------------------------------------------------------------------------------------    Component Value Date/Time   BNP 257.3 (H) 10/27/2014 0015    Inpatient Medications  Scheduled Meds: . chlorhexidine  15 mL Mouth Rinse BID  . clonazePAM  1 mg Per Tube Q8H  . docusate  200 mg Per Tube BID  . feeding supplement (PRO-STAT SUGAR FREE 64)  30 mL Per Tube Daily  . free water  100 mL Per Tube Q4H  . furosemide  40 mg Intravenous Q12H  . levETIRAcetam  500 mg Per Tube Q0600  . levothyroxine  100 mcg Oral QAC breakfast  . mouth rinse  15 mL Mouth Rinse q12n4p  . piperacillin-tazobactam (ZOSYN)  IV  3.375 g Intravenous Q8H  . potassium chloride  40 mEq Oral Daily  . ranitidine  150 mg Per Tube QHS  . sodium chloride flush  3 mL Intravenous Q12H  . TWOCAL HN  1,000 mL Per  Tube Q24H  . Valproate Sodium  750 mg Per Tube TID  . vancomycin  750 mg Intravenous Q12H   Continuous Infusions: PRN Meds:.sodium chloride, acetaminophen **OR** acetaminophen, albuterol, iopamidol, sodium chloride flush  Micro Results Recent Results (from the past 240 hour(s))  Culture, blood (Routine x 2)     Status: None (Preliminary result)   Collection Time: 09/08/16  1:06 PM  Result Value Ref Range Status   Specimen Description BLOOD LEFT HAND  Final   Special Requests BOTTLES DRAWN AEROBIC AND ANAEROBIC 5CC  Final   Culture   Final    NO GROWTH 4 DAYS  Performed at [facility]    Report Status PENDING  Incomplete  Culture, blood (Routine x 2)     Status: Abnormal   Collection Time: 09/08/16  1:09 PM  Result Value Ref Range Status   Specimen Description BLOOD RIGHT ANTECUBITAL  Final   Special Requests BOTTLES DRAWN AEROBIC AND ANAEROBIC 5CC  Final   Culture  Setup Time   Final    GRAM POSITIVE COCCI IN CLUSTERS IN BOTH AEROBIC AND ANAEROBIC BOTTLES CRITICAL RESULT CALLED TO, READ BACK BY AND VERIFIED WITH: N GLOGOVAC,PHARMD AT 1253 09/09/16 BY L BENFIELD    Culture (A)  Final    STAPHYLOCOCCUS SPECIES (COAGULASE NEGATIVE) THE SIGNIFICANCE OF ISOLATING THIS ORGANISM FROM A SINGLE SET OF BLOOD CULTURES WHEN MULTIPLE SETS ARE DRAWN IS UNCERTAIN. PLEASE NOTIFY THE MICROBIOLOGY DEPARTMENT WITHIN ONE WEEK IF SPECIATION AND SENSITIVITIES ARE REQUIRED. Performed at [facility]    Report Status 09/11/2016 FINAL  Final  Blood Culture ID Panel (Reflexed)     Status: Abnormal   Collection Time: 09/08/16  1:09 PM  Result Value Ref Range Status   Enterococcus species NOT DETECTED NOT DETECTED Final   Listeria monocytogenes NOT DETECTED NOT DETECTED Final   Staphylococcus species DETECTED (A) NOT DETECTED Final    Comment: CRITICAL RESULT CALLED TO, READ BACK BY AND VERIFIED WITH: N GLOGOVAC,PHARMD AT 1253 09/09/16 BY L BENFIELD    Staphylococcus aureus NOT DETECTED  NOT DETECTED Final   Methicillin resistance NOT DETECTED NOT DETECTED Final   Streptococcus species NOT DETECTED NOT DETECTED Final   Streptococcus agalactiae NOT DETECTED NOT DETECTED Final   Streptococcus pneumoniae NOT DETECTED NOT DETECTED Final   Streptococcus pyogenes NOT DETECTED NOT DETECTED Final   Acinetobacter baumannii NOT DETECTED NOT DETECTED Final   Enterobacteriaceae species NOT DETECTED NOT DETECTED Final   Enterobacter cloacae complex NOT DETECTED NOT DETECTED Final   Escherichia coli NOT DETECTED NOT DETECTED Final   Klebsiella oxytoca NOT DETECTED NOT DETECTED Final   Klebsiella pneumoniae NOT DETECTED NOT DETECTED Final   Proteus species NOT DETECTED NOT DETECTED Final   Serratia marcescens NOT DETECTED NOT DETECTED Final   Haemophilus influenzae NOT DETECTED NOT DETECTED Final   Neisseria meningitidis NOT DETECTED NOT DETECTED Final   Pseudomonas aeruginosa NOT DETECTED NOT DETECTED Final   Candida albicans NOT DETECTED NOT DETECTED Final   Candida glabrata NOT DETECTED NOT DETECTED Final   Candida krusei NOT DETECTED NOT DETECTED Final   Candida parapsilosis NOT DETECTED NOT DETECTED Final   Candida tropicalis NOT DETECTED NOT DETECTED Final    Comment: Performed at [facility]  Urine culture     Status: None   Collection Time: 09/08/16  3:00 PM  Result Value Ref Range Status   Specimen Description URINE, CATHETERIZED  Final   Special Requests NONE  Final   Culture NO GROWTH Performed at [facility]   Final   Report Status 09/09/2016 FINAL  Final  MRSA PCR Screening     Status: None   Collection Time: 09/08/16 10:21 PM  Result Value Ref Range Status   MRSA by PCR NEGATIVE NEGATIVE Final    Comment:        The GeneXpert MRSA Assay (FDA approved for NASAL specimens only), is one component of a comprehensive MRSA colonization surveillance program. It is not intended to diagnose MRSA infection nor to guide or monitor treatment for MRSA  infections.   Respiratory Panel by PCR     Status: None  Collection Time: 09/09/16  9:43 AM  Result Value Ref Range Status   Adenovirus NOT DETECTED NOT DETECTED Final   Coronavirus 229E NOT DETECTED NOT DETECTED Final   Coronavirus HKU1 NOT DETECTED NOT DETECTED Final   Coronavirus NL63 NOT DETECTED NOT DETECTED Final   Coronavirus OC43 NOT DETECTED NOT DETECTED Final   Metapneumovirus NOT DETECTED NOT DETECTED Final   Rhinovirus / Enterovirus NOT DETECTED NOT DETECTED Final   Influenza A NOT DETECTED NOT DETECTED Final   Influenza B NOT DETECTED NOT DETECTED Final   Parainfluenza Virus 1 NOT DETECTED NOT DETECTED Final   Parainfluenza Virus 2 NOT DETECTED NOT DETECTED Final   Parainfluenza Virus 3 NOT DETECTED NOT DETECTED Final   Parainfluenza Virus 4 NOT DETECTED NOT DETECTED Final   Respiratory Syncytial Virus NOT DETECTED NOT DETECTED Final   Bordetella pertussis NOT DETECTED NOT DETECTED Final   Chlamydophila pneumoniae NOT DETECTED NOT DETECTED Final   Mycoplasma pneumoniae NOT DETECTED NOT DETECTED Final    Comment: Performed at [facility]  Culture, blood (routine x 2)     Status: None (Preliminary result)   Collection Time: 09/10/16 10:29 AM  Result Value Ref Range Status   Specimen Description BLOOD RIGHT HAND  Final   Special Requests IN PEDIATRIC BOTTLE 4CC  Final   Culture  Setup Time   Final    GRAM POSITIVE COCCI IN CLUSTERS IN PEDIATRIC BOTTLE CRITICAL RESULT CALLED TO, READ BACK BY AND VERIFIED WITH: Chales Abrahams, PHARM AT [place] ON [id number] BY Rhea Bleacher    Culture   Final    GRAM POSITIVE COCCI REPEATING ID AND SENSITIVITIES TO VERIFY Performed at [facility]    Report Status PENDING  Incomplete  Culture, blood (routine x 2)     Status: None (Preliminary result)   Collection Time: 09/10/16 10:37 AM  Result Value Ref Range Status   Specimen Description BLOOD LEFT HAND  Final   Special Requests BOTTLES DRAWN AEROBIC ONLY 5CC  Final   Culture    Final    NO GROWTH 2 DAYS Performed at [facility]    Report Status PENDING  Incomplete  Respiratory Panel by PCR     Status: None   Collection Time: 09/13/16  8:14 AM  Result Value Ref Range Status   Adenovirus NOT DETECTED NOT DETECTED Final   Coronavirus 229E NOT DETECTED NOT DETECTED Final   Coronavirus HKU1 NOT DETECTED NOT DETECTED Final   Coronavirus NL63 NOT DETECTED NOT DETECTED Final   Coronavirus OC43 NOT DETECTED NOT DETECTED Final   Metapneumovirus NOT DETECTED NOT DETECTED Final   Rhinovirus / Enterovirus NOT DETECTED NOT DETECTED Final   Influenza A NOT DETECTED NOT DETECTED Final   Influenza B NOT DETECTED NOT DETECTED Final   Parainfluenza Virus 1 NOT DETECTED NOT DETECTED Final   Parainfluenza Virus 2 NOT DETECTED NOT DETECTED Final   Parainfluenza Virus 3 NOT DETECTED NOT DETECTED Final   Parainfluenza Virus 4 NOT DETECTED NOT DETECTED Final   Respiratory Syncytial Virus NOT DETECTED NOT DETECTED Final   Bordetella pertussis NOT DETECTED NOT DETECTED Final   Chlamydophila pneumoniae NOT DETECTED NOT DETECTED Final   Mycoplasma pneumoniae NOT DETECTED NOT DETECTED Final    Comment: Performed at [facility]    Radiology Reports Ct Chest W Contrast  Result Date: 09/11/2016 CLINICAL DATA:  Sepsis EXAM: CT CHEST, ABDOMEN, AND PELVIS WITH CONTRAST TECHNIQUE: Multidetector CT imaging of the chest, abdomen and pelvis was performed following the  standard protocol during bolus administration of intravenous contrast. CONTRAST:  150mL ISOVUE-300 IOPAMIDOL (ISOVUE-300) INJECTION 61% COMPARISON:  06/01/2014 FINDINGS: CT CHEST FINDINGS Cardiovascular: There is no evidence of aortic aneurysm, dissection, or intramural hematoma. Mild coronary artery calcifications in the LAD territory. Mitral annular calcification. Mediastinum/Nodes: No evidence of abnormal mediastinal adenopathy no pericardial effusion. No abnormal axillary adenopathy. Lungs/Pleura: Small bilateral  pleural effusions. No pneumothorax. There is dependent atelectasis bilaterally. Hazy ground-glass throughout the remainder of the lungs likely reflects hypoaeration. Musculoskeletal: There is no vertebral compression deformity in the thoracic spine. CT ABDOMEN PELVIS FINDINGS Hepatobiliary: Diffuse hepatic steatosis.  Postcholecystectomy. Pancreas: There is stranding surrounding the head and body of the pancreas. There is no evidence of pancreatic necrosis or hemorrhage. There is no obvious focal mass in the pancreas. Heterogeneity may simply reflect streak artifact. Spleen: Unremarkable. Adrenals/Urinary Tract: Left kidney remains severely atrophic. Adrenal glands are unremarkable. There is hypertrophy of the right kidney. Bladder is within normal limits. Stomach/Bowel: Gastrostomy tube is in place. There is a foreign body in the stomach that is unchanged compare with the prior study the colon is decompressed. The appendix is within normal limits and contains contrast. There is no obvious mass in the colon. No evidence of small-bowel obstruction. Vascular/Lymphatic: There is no evidence of aortic aneurysm. Mild atherosclerotic calcifications of the aorta and iliac vasculature are noted. No abnormal retroperitoneal adenopathy. Small para-aortic and mesenteric nodes. Reproductive: Uterus contains a single calcification likely related to an old fibroid. No obvious ovarian mass. Other: There is a small amount of free fluid in the upper pelvis and right lower quadrant of the abdomen associated with distal small bowel loops and the uterus. There is also stranding within the mesenteric fat as well as along the course of the ascending and descending colon. There is stranding of the subcutaneous fat as well. These findings may all simply represent volume overload state. Inflammatory process is not excluded. Musculoskeletal: No vertebral compression deformity. IMPRESSION: Small bilateral pleural effusions and dependent  atelectasis. Small amount of free fluid in the lower abdomen and pelvis. This is nonspecific and may be related to volume overload. An inflammatory process is not excluded. Foreign body remains in the stomach Create Gastrostomy tube is in place. There is stranding about the pancreas which may be related to volume overload. A focal inflammatory process is not excluded. Electronically Signed   By: Marybelle Killings M.D.   On: 09/11/2016 12:41   Ct Abdomen Pelvis W Contrast  Result Date: 09/11/2016 CLINICAL DATA:  Sepsis EXAM: CT CHEST, ABDOMEN, AND PELVIS WITH CONTRAST TECHNIQUE: Multidetector CT imaging of the chest, abdomen and pelvis was performed following the standard protocol during bolus administration of intravenous contrast. CONTRAST:  133mL ISOVUE-300 IOPAMIDOL (ISOVUE-300) INJECTION 61% COMPARISON:  06/01/2014 FINDINGS: CT CHEST FINDINGS Cardiovascular: There is no evidence of aortic aneurysm, dissection, or intramural hematoma. Mild coronary artery calcifications in the LAD territory. Mitral annular calcification. Mediastinum/Nodes: No evidence of abnormal mediastinal adenopathy no pericardial effusion. No abnormal axillary adenopathy. Lungs/Pleura: Small bilateral pleural effusions. No pneumothorax. There is dependent atelectasis bilaterally. Hazy ground-glass throughout the remainder of the lungs likely reflects hypoaeration. Musculoskeletal: There is no vertebral compression deformity in the thoracic spine. CT ABDOMEN PELVIS FINDINGS Hepatobiliary: Diffuse hepatic steatosis.  Postcholecystectomy. Pancreas: There is stranding surrounding the head and body of the pancreas. There is no evidence of pancreatic necrosis or hemorrhage. There is no obvious focal mass in the pancreas. Heterogeneity may simply reflect streak artifact. Spleen: Unremarkable. Adrenals/Urinary Tract: Left kidney  remains severely atrophic. Adrenal glands are unremarkable. There is hypertrophy of the right kidney. Bladder is within  normal limits. Stomach/Bowel: Gastrostomy tube is in place. There is a foreign body in the stomach that is unchanged compare with the prior study the colon is decompressed. The appendix is within normal limits and contains contrast. There is no obvious mass in the colon. No evidence of small-bowel obstruction. Vascular/Lymphatic: There is no evidence of aortic aneurysm. Mild atherosclerotic calcifications of the aorta and iliac vasculature are noted. No abnormal retroperitoneal adenopathy. Small para-aortic and mesenteric nodes. Reproductive: Uterus contains a single calcification likely related to an old fibroid. No obvious ovarian mass. Other: There is a small amount of free fluid in the upper pelvis and right lower quadrant of the abdomen associated with distal small bowel loops and the uterus. There is also stranding within the mesenteric fat as well as along the course of the ascending and descending colon. There is stranding of the subcutaneous fat as well. These findings may all simply represent volume overload state. Inflammatory process is not excluded. Musculoskeletal: No vertebral compression deformity. IMPRESSION: Small bilateral pleural effusions and dependent atelectasis. Small amount of free fluid in the lower abdomen and pelvis. This is nonspecific and may be related to volume overload. An inflammatory process is not excluded. Foreign body remains in the stomach Create Gastrostomy tube is in place. There is stranding about the pancreas which may be related to volume overload. A focal inflammatory process is not excluded. Electronically Signed   By: Marybelle Killings M.D.   On: 09/11/2016 12:41   Dg Chest Port 1 View  Result Date: 09/12/2016 CLINICAL DATA:  Shortness of Breath EXAM: PORTABLE CHEST 1 VIEW COMPARISON:  09/11/2016 FINDINGS: Cardiac shadow is mildly enlarged but stable. The overall inspiratory effort is poor with crowding of the vascular markings. Bibasilar atelectatic changes are seen.  The known pleural effusions are not as well appreciated as on the prior CT examination. No bony abnormality is seen. IMPRESSION: Stable bibasilar atelectasis. Overall poor inspiratory effort with crowding of the vascular markings. The known pleural effusions are not well appreciated. Electronically Signed   By: Inez Catalina M.D.   On: 09/12/2016 14:17   Dg Chest Portable 1 View  Result Date: 09/08/2016 CLINICAL DATA:  70 year old female with a history of sepsis EXAM: PORTABLE CHEST 1 VIEW COMPARISON:  06/03/2016, 02/08/2016, 11/11/2014 FINDINGS: Significant right rotation somewhat limits evaluation. Low lung volumes accentuating the interstitium. Interstitial opacities bilaterally. No pneumothorax identified.  No large pleural effusion. No large confluent airspace disease. IMPRESSION: Limited exam given the positioning with low lung volumes and likely atelectasis. No definite lobar pneumonia. Signed, Dulcy Fanny. Earleen Newport, DO [facility] Electronically Signed   By: Corrie Mckusick D.O.   On: 09/08/2016 13:31   US Abdomen Limited Ruq  Result Date: 09/09/2016 CLINICAL DATA:  Sepsis.  Prior cholecystectomy. EXAM: US ABDOMEN LIMITED - RIGHT UPPER QUADRANT COMPARISON:  None. FINDINGS: Limited exam due to body habitus. Gallbladder: Surgically absent. Common bile duct: Diameter: 2.3 mm Liver: Diffusely increased in echogenicity. IMPRESSION: Surgically absent gallbladder.  No biliary ductal dilatation. Hepatic steatosis. Electronically Signed   By: Lovey Newcomer M.D.   On: 09/09/2016 10:44    Time Spent in minutes  35   Louellen Molder M.D on 09/13/2016 at 1:09 PM  Between 7am to 7pm - Pager - [phone]  After 7pm go to [email or web address] - password [facility]  [facility] -  Office  [phone]

## 2016-09-13 NOTE — Progress Notes (Signed)
EEG completed; results pending.    

## 2016-09-14 ENCOUNTER — Inpatient Hospital Stay (HOSPITAL_COMMUNITY): Payer: Medicare Other

## 2016-09-14 DIAGNOSIS — J9601 Acute respiratory failure with hypoxia: Secondary | ICD-10-CM

## 2016-09-14 DIAGNOSIS — J810 Acute pulmonary edema: Secondary | ICD-10-CM | POA: Diagnosis present

## 2016-09-14 DIAGNOSIS — R7881 Bacteremia: Secondary | ICD-10-CM

## 2016-09-14 DIAGNOSIS — Z9189 Other specified personal risk factors, not elsewhere classified: Secondary | ICD-10-CM

## 2016-09-14 DIAGNOSIS — A419 Sepsis, unspecified organism: Secondary | ICD-10-CM

## 2016-09-14 LAB — CULTURE, BLOOD (ROUTINE X 2)

## 2016-09-14 LAB — CBC
HCT: 32.8 % — ABNORMAL LOW (ref 36.0–46.0)
Hemoglobin: 10.8 g/dL — ABNORMAL LOW (ref 12.0–15.0)
MCH: 33.2 pg (ref 26.0–34.0)
MCHC: 32.9 g/dL (ref 30.0–36.0)
MCV: 100.9 fL — AB (ref 78.0–100.0)
PLATELETS: 317 10*3/uL (ref 150–400)
RBC: 3.25 MIL/uL — ABNORMAL LOW (ref 3.87–5.11)
RDW: 16.2 % — AB (ref 11.5–15.5)
WBC: 16.8 10*3/uL — AB (ref 4.0–10.5)

## 2016-09-14 LAB — BASIC METABOLIC PANEL
Anion gap: 13 (ref 5–15)
BUN: 20 mg/dL (ref 6–20)
CHLORIDE: 98 mmol/L — AB (ref 101–111)
CO2: 30 mmol/L (ref 22–32)
CREATININE: 0.7 mg/dL (ref 0.44–1.00)
Calcium: 8.2 mg/dL — ABNORMAL LOW (ref 8.9–10.3)
GFR calc Af Amer: >60 mL/min (ref >60)
GFR calc non Af Amer: >60 mL/min (ref >60)
GLUCOSE: 149 mg/dL — AB (ref 65–99)
Potassium: 4.1 mmol/L (ref 3.5–5.1)
SODIUM: 141 mmol/L (ref 135–145)

## 2016-09-14 LAB — PROCALCITONIN: PROCALCITONIN: 2.62 ng/mL

## 2016-09-14 LAB — LACTIC ACID, PLASMA
Lactic Acid, Venous: 6.3 mmol/L (ref 0.5–1.9)
Lactic Acid, Venous: 6.7 mmol/L (ref 0.5–1.9)

## 2016-09-14 LAB — ECHOCARDIOGRAM COMPLETE
Height: 60 in
WEIGHTICAEL: 2388.02 [oz_av]

## 2016-09-14 LAB — AMYLASE: Amylase: 44 U/L (ref 28–100)

## 2016-09-14 LAB — LIPASE, BLOOD: Lipase: 48 U/L (ref 11–51)

## 2016-09-14 LAB — D-DIMER, QUANTITATIVE: D-Dimer, Quant: 1.05 ug/mL-FEU — ABNORMAL HIGH (ref 0.00–0.50)

## 2016-09-14 LAB — LACTATE DEHYDROGENASE: LDH: 207 U/L — AB (ref 98–192)

## 2016-09-14 MED ORDER — LEVOFLOXACIN 750 MG PO TABS
750.0000 mg | ORAL_TABLET | Freq: Every day | ORAL | Status: DC
  Administered 2016-09-14 – 2016-09-15 (×2): 750 mg via ORAL
  Filled 2016-09-14 (×2): qty 1

## 2016-09-14 MED ORDER — FUROSEMIDE 10 MG/ML IJ SOLN
80.0000 mg | Freq: Two times a day (BID) | INTRAMUSCULAR | Status: DC
  Administered 2016-09-14 – 2016-09-18 (×6): 80 mg via INTRAVENOUS
  Filled 2016-09-14 (×8): qty 8

## 2016-09-14 NOTE — Progress Notes (Signed)
CRITICAL VALUE ALERT  Critical value received:  Lactic 6.3  Date of notification:  112/0/17  Time of notification: [id number]  Critical value read back: yes  Nurse who received alert:  G. Gerilyn Nestle, RN  MD notified (1st page):  Dr Clementeen Graham present at time of notification  Time of first page:  1025  MD notified (2nd page):  Time of second page:  Responding MD: [email or web address]  Time MD responded:  1025

## 2016-09-14 NOTE — Progress Notes (Signed)
-  Patient is from SNF: [facility] and may be a weekend discharge. Call placed to facility in effort to make sure a weekend dc can be managed in which they confirm to call theadmissions cell phone:  [phone].  Plan is to return to SNF once medically stable.  Lane Hacker, MSW Clinical Social Work: [employer] Coverage for : [phone]

## 2016-09-14 NOTE — Progress Notes (Signed)
Pharmacy Antibiotic Note  Dawn [name] is a 70 y.o. female presented to the ED  on 09/08/2016 with fever. Broad abx with vancomycin and zosyn were started for suspected sepsis.   Day #7 vancomycin/zosyn  Today, 09/14/2016: - Tmax 99.4 - LA worsening - repeat PCT pending - WBC Unchanged - SCr appears stable - repeat bcx with 1/2 GPC in clusters - identified as methicillin resistant CoNS.   Plan: - Continue vancomycin 750 mg IV q12h - Plan to repeat vancomycin trough 12/2 - continue zosyn 3.375 gm IV q8h (infuse over 4 hours) - f/u d/c if clinically appropriate.   - f/u length of therapy ______________________________  Height: 5' (152.4 cm) Weight: 149 lb 4 oz (67.7 kg) IBW/kg (Calculated) : 45.5  Temp (24hrs), Avg:98.3 F (36.8 C), Min:97.7 F (36.5 C), Max:99.2 F (37.3 C)   Recent Labs Lab 09/09/16 0354 09/10/16 0340 09/10/16 0755 09/10/16 1029 09/11/16 0335 09/11/16 0804 09/11/16 0934 09/12/16 0515 09/12/16 0828 09/12/16 1111 09/12/16 1353 09/13/16 0514 09/14/16 0426  WBC 27.4* 23.7*  --   --  16.6*  --   --  16.2*  --  14.1*  --  15.7* 16.8*  CREATININE 0.72 0.68  --   --  0.55  --   --   --   --   --  0.55  --  0.70  LATICACIDVEN  --   --  5.5* 5.5*  --  3.9*  --   --  5.5* 5.2*  --   --   --   VANCOTROUGH  --   --   --   --   --   --  11*  --   --   --   --   --   --     Estimated Creatinine Clearance: 56.2 mL/min (by C-G formula based on SCr of 0.7 mg/dL).    Allergies  Allergen Reactions  . Carvedilol   . Ppd [Tuberculin Purified Protein Derivative]     Per MAR   Antimicrobials this admission:  11/25 Vancomycin >>  11/25 Zosyn>>   Dose adjustments this admission:  11/28 VT at 0900 = 11 ( 500 mg q12h) --> increase to 750 mg q12h  Microbiology results:  11/25 BCx x2: 1/2 Coagulase-negative staph (BCID + staph species, no resistance) 11/25 UCx: neg FINAL 11/26 Resp Panel PCR: neg 11/25 MRSA PCR: negative 11/27 bcx x2: MR-CoNS  Thank  you for allowing pharmacy to be a part of this patient's care.  Doreene Eland, PharmD, BCPS.   Pager: [phone] 09/14/2016 7:43 AM

## 2016-09-14 NOTE — Progress Notes (Addendum)
PROGRESS NOTE                                                                                                                                                                                                             Patient Demographics:    Dawn [name], is a 70 y.o. female, DOB - Jan 10, 1946, [id number]  Admit date - 09/08/2016   Admitting Physician Velvet Bathe, MD  Outpatient Primary MD for the patient is Inocencio Homes, MD  LOS - 6  Outpatient Specialists: none  Chief Complaint  Patient presents with  . Altered Mental Status  . Code Sepsis  . Hyperventilating       Brief Narrative   70 year old female with developmental delay, seizure disorder, dementia, A. fib, hypothyroidism who was sent from SNF with fever. She was hospitalized in August this year with aspiration pneumonia. Patient septic in the ED with hypotension, tachycardia, tachypnea and fever of 103.90F. Blood work showed WBC of 20 7.8K, lactic acid of 8.3 and potassium of 5.5. She was started on empiric vancomycin and Zosyn. Patient admitted to stepdown unit and transferred to medical floor 2 days later.    Subjective:   Afebrile . Improved breathing with    Assessment  & Plan :    Principal Problem:  Severe Sepsis ([place]) Source unclear. Already received 7 days of empiric vancomycin and Zosyn.  Respiratory viral panel negative. LFTs normal. Persistently elevated lactate and high normal pro calcitonin. Depakote level normal. EEG without seizure activity. -2-D echo with EF 65-70%, no wall motion abnormality, no evidence of vegetation. Blood culture from 10/27 again showing coag-negative staph. Discontinue IV abx , switch to oral levaquin to complete 10 day course. CT chest abdomen and pelvis  unremarkable. Ultrasound abdomen negative for infection as well. -Appreciate PCCM consult. Recommend non-aggressive treatment given overall poor  prognosis. No further lactate monitoring.  Active Problems: Acute respiratory failure with hypoxia Secondary to volume overload. Diuresing well with IV Lasix. 2-D echo with EF 65-70% and no wall motion abnormality. Continue Foley for now.    Seizure disorder (HCC) Continue Keppra, Depakote and clonazepam.    Hypothyroidism Continue Synthroid.  Essential hypertension Metoprolol on hold due to hypotension.  Hypokalemia Replenished    Code Status : DO NOT RESUSCITATE  Family Communication  : Discussed in detail with sister who is her legal  guardian. She understands patient's poor quality of life. She would like patient to be treated for any treatable medical illness that would improve her quality of life. Discussed options of palliative care follow-up at the facility and she agrees.  Disposition Plan : return to SNF if remains afebrile and breathing improved in the morning.  Barriers For Discharge : Improving symptoms  Consults  :   PC CM  Procedures  : Ultrasound abdomen CT chest abdomen and pelvis 2-D echo  DVT Prophylaxis  :  Lovenox -   Lab Results  Component Value Date   PLT 317 09/14/2016    Antibiotics  :   Anti-infectives    Start     Dose/Rate Route Frequency Ordered Stop   09/11/16 1100  vancomycin (VANCOCIN) IVPB 750 mg/150 ml premix     750 mg 150 mL/hr over 60 Minutes Intravenous Every 12 hours 09/11/16 1035     09/09/16 2100  vancomycin (VANCOCIN) 500 mg in sodium chloride 0.9 % 100 mL IVPB  Status:  Discontinued     500 mg 100 mL/hr over 60 Minutes Intravenous Every 12 hours 09/09/16 2050 09/11/16 1035   09/09/16 0200  vancomycin (VANCOCIN) 500 mg in sodium chloride 0.9 % 100 mL IVPB  Status:  Discontinued     500 mg 100 mL/hr over 60 Minutes Intravenous Every 12 hours 09/08/16 1437 09/09/16 1123   09/08/16 2000  piperacillin-tazobactam (ZOSYN) IVPB 3.375 g     3.375 g 12.5 mL/hr over 240 Minutes Intravenous Every 8 hours 09/08/16 1437      09/08/16 1345  piperacillin-tazobactam (ZOSYN) IVPB 3.375 g     3.375 g 100 mL/hr over 30 Minutes Intravenous  Once 09/08/16 1340 09/08/16 1415   09/08/16 1345  vancomycin (VANCOCIN) IVPB 1000 mg/200 mL premix     1,000 mg 200 mL/hr over 60 Minutes Intravenous STAT 09/08/16 1340 09/08/16 1453        Objective:   Vitals:   09/13/16 0650 09/13/16 1327 09/13/16 2037 09/14/16 0655  BP: (!) 110/57 132/60 103/64 95/77  Pulse: (!) 111 (!) 113 (!) 109 (!) 116  Resp: 20 18  16   Temp: 99.4 F (37.4 C) 99.2 F (37.3 C) 97.7 F (36.5 C) 97.9 F (36.6 C)  TempSrc: Oral Axillary Axillary Axillary  SpO2: 96% 96% 99% 96%  Weight:      Height:        Wt Readings from Last 3 Encounters:  09/08/16 67.7 kg (149 lb 4 oz)  08/23/16 65.4 kg (144 lb 3.2 oz)  07/25/16 63.5 kg (140 lb)     Intake/Output Summary (Last 24 hours) at 09/14/16 1430 Last data filed at 09/14/16 1400  Gross per 24 hour  Intake           2029.1 ml  Output             4425 ml  Net          -2395.9 ml     Physical Exam  [id number],  HEENT: Moist mucosa, supple neck,  Chest: Improved breath sounds bilaterally CVS: S1 and S2 tachycardic, no murmurs or gallop GI: soft, NT, ND, BS+, G-tube in place ,, Foley placed Musculoskeletal: warm , 1+ pitting edema bilaterally CNS: Awake to common, non-verbal,    Data Review:    CBC  Recent Labs Lab 09/08/16 1309  09/11/16 0335 09/12/16 0515 09/12/16 1111 09/13/16 0514 09/14/16 0426  WBC 27.8*  < > 16.6* 16.2* 14.1* 15.7* 16.8*  HGB 14.1  < >  10.2* 10.4* 10.0* 10.6* 10.8*  HCT 41.3  < > 31.2* 32.2* 30.1* 32.2* 32.8*  PLT 433*  < > 278 297 292 300 317  MCV 99.5  < > 102.3* 101.3* 101.3* 100.9* 100.9*  MCH 34.0  < > 33.4 32.7 33.7 33.2 33.2  MCHC 34.1  < > 32.7 32.3 33.2 32.9 32.9  RDW 16.0*  < > 16.1* 16.1* 16.1* 16.3* 16.2*  LYMPHSABS 5.0*  --   --   --   --   --   --   MONOABS 2.8*  --   --   --   --   --   --   EOSABS 0.0  --   --   --   --   --   --     BASOSABS 0.0  --   --   --   --   --   --   < > = values in this interval not displayed.  Chemistries   Recent Labs Lab 09/08/16 1309 09/09/16 0354 09/10/16 0340 09/11/16 0335 09/12/16 1353 09/14/16 0426  NA 133* 136 139 142 138 141  K 5.5* 4.0 3.7 3.5 3.3* 4.1  CL 98* 103 104 108 107 98*  CO2 22 25 25 29 24 30   GLUCOSE 115* 93 161* 146* 119* 149*  BUN 25* 22* 20 18 13 20   CREATININE 0.87 0.72 0.68 0.55 0.55 0.70  CALCIUM 8.2* 8.1* 8.2* 8.3* 8.2* 8.2*  AST 161*  --   --   --  98*  --   ALT 54  --   --   --  43  --   ALKPHOS 347*  --   --   --  394*  --   BILITOT 2.1*  --   --   --  1.5*  --    ------------------------------------------------------------------------------------------------------------------ No results for input(s): CHOL, HDL, LDLCALC, TRIG, CHOLHDL, LDLDIRECT in the last 72 hours.  No results found for: HGBA1C ------------------------------------------------------------------------------------------------------------------ No results for input(s): TSH, T4TOTAL, T3FREE, THYROIDAB in the last 72 hours.  Invalid input(s): FREET3 ------------------------------------------------------------------------------------------------------------------ No results for input(s): VITAMINB12, FOLATE, FERRITIN, TIBC, IRON, RETICCTPCT in the last 72 hours.  Coagulation profile  Recent Labs Lab 09/08/16 1450  INR 1.31     Recent Labs  09/14/16 0920  DDIMER 1.05*    Cardiac Enzymes No results for input(s): CKMB, TROPONINI, MYOGLOBIN in the last 168 hours.  Invalid input(s): CK ------------------------------------------------------------------------------------------------------------------    Component Value Date/Time   BNP 257.3 (H) 10/27/2014 0015    Inpatient Medications  Scheduled Meds: . chlorhexidine  15 mL Mouth Rinse BID  . clonazePAM  1 mg Per Tube Q8H  . docusate  200 mg Per Tube BID  . feeding supplement (PRO-STAT SUGAR FREE 64)  30 mL Per  Tube Daily  . free water  100 mL Per Tube Q4H  . furosemide  80 mg Intravenous Q12H  . levETIRAcetam  500 mg Per Tube Q0600  . levothyroxine  100 mcg Oral QAC breakfast  . mouth rinse  15 mL Mouth Rinse q12n4p  . piperacillin-tazobactam (ZOSYN)  IV  3.375 g Intravenous Q8H  . potassium chloride  40 mEq Oral Daily  . ranitidine  150 mg Per Tube QHS  . sodium chloride flush  3 mL Intravenous Q12H  . TWOCAL HN  1,000 mL Per Tube Q24H  . Valproate Sodium  750 mg Per Tube TID  . vancomycin  750 mg Intravenous Q12H   Continuous Infusions: PRN Meds:.sodium chloride,  acetaminophen **OR** acetaminophen, albuterol, iopamidol, sodium chloride flush  Micro Results Recent Results (from the past 240 hour(s))  Culture, blood (Routine x 2)     Status: None   Collection Time: 09/08/16  1:06 PM  Result Value Ref Range Status   Specimen Description BLOOD LEFT HAND  Final   Special Requests BOTTLES DRAWN AEROBIC AND ANAEROBIC 5CC  Final   Culture   Final    NO GROWTH 5 DAYS Performed at [facility]    Report Status 09/13/2016 FINAL  Final  Culture, blood (Routine x 2)     Status: Abnormal   Collection Time: 09/08/16  1:09 PM  Result Value Ref Range Status   Specimen Description BLOOD RIGHT ANTECUBITAL  Final   Special Requests BOTTLES DRAWN AEROBIC AND ANAEROBIC 5CC  Final   Culture  Setup Time   Final    GRAM POSITIVE COCCI IN CLUSTERS IN BOTH AEROBIC AND ANAEROBIC BOTTLES CRITICAL RESULT CALLED TO, READ BACK BY AND VERIFIED WITH: N GLOGOVAC,PHARMD AT 1253 09/09/16 BY L BENFIELD    Culture (A)  Final    STAPHYLOCOCCUS SPECIES (COAGULASE NEGATIVE) THE SIGNIFICANCE OF ISOLATING THIS ORGANISM FROM A SINGLE SET OF BLOOD CULTURES WHEN MULTIPLE SETS ARE DRAWN IS UNCERTAIN. PLEASE NOTIFY THE MICROBIOLOGY DEPARTMENT WITHIN ONE WEEK IF SPECIATION AND SENSITIVITIES ARE REQUIRED. Performed at [facility]    Report Status 09/11/2016 FINAL  Final  Blood Culture ID Panel (Reflexed)      Status: Abnormal   Collection Time: 09/08/16  1:09 PM  Result Value Ref Range Status   Enterococcus species NOT DETECTED NOT DETECTED Final   Listeria monocytogenes NOT DETECTED NOT DETECTED Final   Staphylococcus species DETECTED (A) NOT DETECTED Final    Comment: CRITICAL RESULT CALLED TO, READ BACK BY AND VERIFIED WITH: N GLOGOVAC,PHARMD AT 1253 09/09/16 BY L BENFIELD    Staphylococcus aureus NOT DETECTED NOT DETECTED Final   Methicillin resistance NOT DETECTED NOT DETECTED Final   Streptococcus species NOT DETECTED NOT DETECTED Final   Streptococcus agalactiae NOT DETECTED NOT DETECTED Final   Streptococcus pneumoniae NOT DETECTED NOT DETECTED Final   Streptococcus pyogenes NOT DETECTED NOT DETECTED Final   Acinetobacter baumannii NOT DETECTED NOT DETECTED Final   Enterobacteriaceae species NOT DETECTED NOT DETECTED Final   Enterobacter cloacae complex NOT DETECTED NOT DETECTED Final   Escherichia coli NOT DETECTED NOT DETECTED Final   Klebsiella oxytoca NOT DETECTED NOT DETECTED Final   Klebsiella pneumoniae NOT DETECTED NOT DETECTED Final   Proteus species NOT DETECTED NOT DETECTED Final   Serratia marcescens NOT DETECTED NOT DETECTED Final   Haemophilus influenzae NOT DETECTED NOT DETECTED Final   Neisseria meningitidis NOT DETECTED NOT DETECTED Final   Pseudomonas aeruginosa NOT DETECTED NOT DETECTED Final   Candida albicans NOT DETECTED NOT DETECTED Final   Candida glabrata NOT DETECTED NOT DETECTED Final   Candida krusei NOT DETECTED NOT DETECTED Final   Candida parapsilosis NOT DETECTED NOT DETECTED Final   Candida tropicalis NOT DETECTED NOT DETECTED Final    Comment: Performed at [facility]  Urine culture     Status: None   Collection Time: 09/08/16  3:00 PM  Result Value Ref Range Status   Specimen Description URINE, CATHETERIZED  Final   Special Requests NONE  Final   Culture NO GROWTH Performed at [facility]   Final   Report Status 09/09/2016  FINAL  Final  MRSA PCR Screening     Status: None   Collection  Time: 09/08/16 10:21 PM  Result Value Ref Range Status   MRSA by PCR NEGATIVE NEGATIVE Final    Comment:        The GeneXpert MRSA Assay (FDA approved for NASAL specimens only), is one component of a comprehensive MRSA colonization surveillance program. It is not intended to diagnose MRSA infection nor to guide or monitor treatment for MRSA infections.   Respiratory Panel by PCR     Status: None   Collection Time: 09/09/16  9:43 AM  Result Value Ref Range Status   Adenovirus NOT DETECTED NOT DETECTED Final   Coronavirus 229E NOT DETECTED NOT DETECTED Final   Coronavirus HKU1 NOT DETECTED NOT DETECTED Final   Coronavirus NL63 NOT DETECTED NOT DETECTED Final   Coronavirus OC43 NOT DETECTED NOT DETECTED Final   Metapneumovirus NOT DETECTED NOT DETECTED Final   Rhinovirus / Enterovirus NOT DETECTED NOT DETECTED Final   Influenza A NOT DETECTED NOT DETECTED Final   Influenza B NOT DETECTED NOT DETECTED Final   Parainfluenza Virus 1 NOT DETECTED NOT DETECTED Final   Parainfluenza Virus 2 NOT DETECTED NOT DETECTED Final   Parainfluenza Virus 3 NOT DETECTED NOT DETECTED Final   Parainfluenza Virus 4 NOT DETECTED NOT DETECTED Final   Respiratory Syncytial Virus NOT DETECTED NOT DETECTED Final   Bordetella pertussis NOT DETECTED NOT DETECTED Final   Chlamydophila pneumoniae NOT DETECTED NOT DETECTED Final   Mycoplasma pneumoniae NOT DETECTED NOT DETECTED Final    Comment: Performed at [facility]  Culture, blood (routine x 2)     Status: Abnormal   Collection Time: 09/10/16 10:29 AM  Result Value Ref Range Status   Specimen Description BLOOD RIGHT HAND  Final   Special Requests IN PEDIATRIC BOTTLE 4CC  Final   Culture  Setup Time   Final    GRAM POSITIVE COCCI IN CLUSTERS IN PEDIATRIC BOTTLE CRITICAL RESULT CALLED TO, READ BACK BY AND VERIFIED WITH: Chales Abrahams, [place] AT [id number] ON [id number] BY Rhea Bleacher    Culture  (A)  Final    STAPHYLOCOCCUS SPECIES (COAGULASE NEGATIVE) SUSCEPTIBILITIES REQUESTED BY PHARMACY Performed at [facility]    Report Status 09/14/2016 FINAL  Final   Organism ID, Bacteria STAPHYLOCOCCUS SPECIES (COAGULASE NEGATIVE)  Final      Susceptibility   Staphylococcus species (coagulase negative) - MIC*    CIPROFLOXACIN >=8 RESISTANT Resistant     ERYTHROMYCIN >=8 RESISTANT Resistant     GENTAMICIN <=0.5 SENSITIVE Sensitive     OXACILLIN 0.5 RESISTANT Resistant     TETRACYCLINE <=1 SENSITIVE Sensitive     VANCOMYCIN 1 SENSITIVE Sensitive     TRIMETH/SULFA <=10 SENSITIVE Sensitive     CLINDAMYCIN <=0.25 RESISTANT Resistant     RIFAMPIN <=0.5 SENSITIVE Sensitive     Inducible Clindamycin POSITIVE Resistant     * STAPHYLOCOCCUS SPECIES (COAGULASE NEGATIVE)  Culture, blood (routine x 2)     Status: None (Preliminary result)   Collection Time: 09/10/16 10:37 AM  Result Value Ref Range Status   Specimen Description BLOOD LEFT HAND  Final   Special Requests BOTTLES DRAWN AEROBIC ONLY 5CC  Final   Culture   Final    NO GROWTH 4 DAYS Performed at [facility]    Report Status PENDING  Incomplete  Respiratory Panel by PCR     Status: None   Collection Time: 09/13/16  8:14 AM  Result Value Ref Range Status   Adenovirus NOT DETECTED NOT DETECTED Final   Coronavirus 229E NOT DETECTED  NOT DETECTED Final   Coronavirus HKU1 NOT DETECTED NOT DETECTED Final   Coronavirus NL63 NOT DETECTED NOT DETECTED Final   Coronavirus OC43 NOT DETECTED NOT DETECTED Final   Metapneumovirus NOT DETECTED NOT DETECTED Final   Rhinovirus / Enterovirus NOT DETECTED NOT DETECTED Final   Influenza A NOT DETECTED NOT DETECTED Final   Influenza B NOT DETECTED NOT DETECTED Final   Parainfluenza Virus 1 NOT DETECTED NOT DETECTED Final   Parainfluenza Virus 2 NOT DETECTED NOT DETECTED Final   Parainfluenza Virus 3 NOT DETECTED NOT DETECTED Final   Parainfluenza Virus 4 NOT DETECTED NOT DETECTED  Final   Respiratory Syncytial Virus NOT DETECTED NOT DETECTED Final   Bordetella pertussis NOT DETECTED NOT DETECTED Final   Chlamydophila pneumoniae NOT DETECTED NOT DETECTED Final   Mycoplasma pneumoniae NOT DETECTED NOT DETECTED Final    Comment: Performed at [facility]    Radiology Reports Ct Chest W Contrast  Result Date: 09/11/2016 CLINICAL DATA:  Sepsis EXAM: CT CHEST, ABDOMEN, AND PELVIS WITH CONTRAST TECHNIQUE: Multidetector CT imaging of the chest, abdomen and pelvis was performed following the standard protocol during bolus administration of intravenous contrast. CONTRAST:  119mL ISOVUE-300 IOPAMIDOL (ISOVUE-300) INJECTION 61% COMPARISON:  06/01/2014 FINDINGS: CT CHEST FINDINGS Cardiovascular: There is no evidence of aortic aneurysm, dissection, or intramural hematoma. Mild coronary artery calcifications in the LAD territory. Mitral annular calcification. Mediastinum/Nodes: No evidence of abnormal mediastinal adenopathy no pericardial effusion. No abnormal axillary adenopathy. Lungs/Pleura: Small bilateral pleural effusions. No pneumothorax. There is dependent atelectasis bilaterally. Hazy ground-glass throughout the remainder of the lungs likely reflects hypoaeration. Musculoskeletal: There is no vertebral compression deformity in the thoracic spine. CT ABDOMEN PELVIS FINDINGS Hepatobiliary: Diffuse hepatic steatosis.  Postcholecystectomy. Pancreas: There is stranding surrounding the head and body of the pancreas. There is no evidence of pancreatic necrosis or hemorrhage. There is no obvious focal mass in the pancreas. Heterogeneity may simply reflect streak artifact. Spleen: Unremarkable. Adrenals/Urinary Tract: Left kidney remains severely atrophic. Adrenal glands are unremarkable. There is hypertrophy of the right kidney. Bladder is within normal limits. Stomach/Bowel: Gastrostomy tube is in place. There is a foreign body in the stomach that is unchanged compare with the prior  study the colon is decompressed. The appendix is within normal limits and contains contrast. There is no obvious mass in the colon. No evidence of small-bowel obstruction. Vascular/Lymphatic: There is no evidence of aortic aneurysm. Mild atherosclerotic calcifications of the aorta and iliac vasculature are noted. No abnormal retroperitoneal adenopathy. Small para-aortic and mesenteric nodes. Reproductive: Uterus contains a single calcification likely related to an old fibroid. No obvious ovarian mass. Other: There is a small amount of free fluid in the upper pelvis and right lower quadrant of the abdomen associated with distal small bowel loops and the uterus. There is also stranding within the mesenteric fat as well as along the course of the ascending and descending colon. There is stranding of the subcutaneous fat as well. These findings may all simply represent volume overload state. Inflammatory process is not excluded. Musculoskeletal: No vertebral compression deformity. IMPRESSION: Small bilateral pleural effusions and dependent atelectasis. Small amount of free fluid in the lower abdomen and pelvis. This is nonspecific and may be related to volume overload. An inflammatory process is not excluded. Foreign body remains in the stomach Create Gastrostomy tube is in place. There is stranding about the pancreas which may be related to volume overload. A focal inflammatory process is not excluded. Electronically Signed   By: Arnell Sieving  Hoss M.D.   On: 09/11/2016 12:41   Ct Abdomen Pelvis W Contrast  Result Date: 09/11/2016 CLINICAL DATA:  Sepsis EXAM: CT CHEST, ABDOMEN, AND PELVIS WITH CONTRAST TECHNIQUE: Multidetector CT imaging of the chest, abdomen and pelvis was performed following the standard protocol during bolus administration of intravenous contrast. CONTRAST:  181mL ISOVUE-300 IOPAMIDOL (ISOVUE-300) INJECTION 61% COMPARISON:  06/01/2014 FINDINGS: CT CHEST FINDINGS Cardiovascular: There is no evidence  of aortic aneurysm, dissection, or intramural hematoma. Mild coronary artery calcifications in the LAD territory. Mitral annular calcification. Mediastinum/Nodes: No evidence of abnormal mediastinal adenopathy no pericardial effusion. No abnormal axillary adenopathy. Lungs/Pleura: Small bilateral pleural effusions. No pneumothorax. There is dependent atelectasis bilaterally. Hazy ground-glass throughout the remainder of the lungs likely reflects hypoaeration. Musculoskeletal: There is no vertebral compression deformity in the thoracic spine. CT ABDOMEN PELVIS FINDINGS Hepatobiliary: Diffuse hepatic steatosis.  Postcholecystectomy. Pancreas: There is stranding surrounding the head and body of the pancreas. There is no evidence of pancreatic necrosis or hemorrhage. There is no obvious focal mass in the pancreas. Heterogeneity may simply reflect streak artifact. Spleen: Unremarkable. Adrenals/Urinary Tract: Left kidney remains severely atrophic. Adrenal glands are unremarkable. There is hypertrophy of the right kidney. Bladder is within normal limits. Stomach/Bowel: Gastrostomy tube is in place. There is a foreign body in the stomach that is unchanged compare with the prior study the colon is decompressed. The appendix is within normal limits and contains contrast. There is no obvious mass in the colon. No evidence of small-bowel obstruction. Vascular/Lymphatic: There is no evidence of aortic aneurysm. Mild atherosclerotic calcifications of the aorta and iliac vasculature are noted. No abnormal retroperitoneal adenopathy. Small para-aortic and mesenteric nodes. Reproductive: Uterus contains a single calcification likely related to an old fibroid. No obvious ovarian mass. Other: There is a small amount of free fluid in the upper pelvis and right lower quadrant of the abdomen associated with distal small bowel loops and the uterus. There is also stranding within the mesenteric fat as well as along the course of the  ascending and descending colon. There is stranding of the subcutaneous fat as well. These findings may all simply represent volume overload state. Inflammatory process is not excluded. Musculoskeletal: No vertebral compression deformity. IMPRESSION: Small bilateral pleural effusions and dependent atelectasis. Small amount of free fluid in the lower abdomen and pelvis. This is nonspecific and may be related to volume overload. An inflammatory process is not excluded. Foreign body remains in the stomach Create Gastrostomy tube is in place. There is stranding about the pancreas which may be related to volume overload. A focal inflammatory process is not excluded. Electronically Signed   By: Marybelle Killings M.D.   On: 09/11/2016 12:41   Dg Chest Port 1 View  Result Date: 09/12/2016 CLINICAL DATA:  Shortness of Breath EXAM: PORTABLE CHEST 1 VIEW COMPARISON:  09/11/2016 FINDINGS: Cardiac shadow is mildly enlarged but stable. The overall inspiratory effort is poor with crowding of the vascular markings. Bibasilar atelectatic changes are seen. The known pleural effusions are not as well appreciated as on the prior CT examination. No bony abnormality is seen. IMPRESSION: Stable bibasilar atelectasis. Overall poor inspiratory effort with crowding of the vascular markings. The known pleural effusions are not well appreciated. Electronically Signed   By: Inez Catalina M.D.   On: 09/12/2016 14:17   Dg Chest Portable 1 View  Result Date: 09/08/2016 CLINICAL DATA:  70 year old female with a history of sepsis EXAM: PORTABLE CHEST 1 VIEW COMPARISON:  06/03/2016, 02/08/2016, 11/11/2014 FINDINGS: Significant  right rotation somewhat limits evaluation. Low lung volumes accentuating the interstitium. Interstitial opacities bilaterally. No pneumothorax identified.  No large pleural effusion. No large confluent airspace disease. IMPRESSION: Limited exam given the positioning with low lung volumes and likely atelectasis. No definite  lobar pneumonia. Signed, Dulcy Fanny. Earleen Newport, DO [facility] Electronically Signed   By: Corrie Mckusick D.O.   On: 09/08/2016 13:31   US Abdomen Limited Ruq  Result Date: 09/09/2016 CLINICAL DATA:  Sepsis.  Prior cholecystectomy. EXAM: US ABDOMEN LIMITED - RIGHT UPPER QUADRANT COMPARISON:  None. FINDINGS: Limited exam due to body habitus. Gallbladder: Surgically absent. Common bile duct: Diameter: 2.3 mm Liver: Diffusely increased in echogenicity. IMPRESSION: Surgically absent gallbladder.  No biliary ductal dilatation. Hepatic steatosis. Electronically Signed   By: Lovey Newcomer M.D.   On: 09/09/2016 10:44    Time Spent in minutes  35   Louellen Molder M.D on 09/14/2016 at 2:30 PM  Between 7am to 7pm - Pager - [phone]  After 7pm go to [email or web address] - password [facility]  [facility] -  Office  [phone]

## 2016-09-14 NOTE — Progress Notes (Signed)
  Echocardiogram 2D Echocardiogram has been performed.  Jennette Dubin 09/14/2016, 12:18 PM

## 2016-09-14 NOTE — Progress Notes (Signed)
CRITICAL VALUE ALERT  Critical value received:  Lactic 6.7  Date of notification:  09/14/16  Time of notification: 1400  Critical value read back: yes  Nurse who received alert: G. Gerilyn Nestle, RN  MD notified (1st page): [place]  Time of first page:  1403  MD notified (2nd page):  Time of second page:  Responding MD: Dhungel  Time MD responded:  [id number], present on unit

## 2016-09-14 NOTE — Consult Note (Signed)
Name: Dawn [name] MRN: [id number] DOB: 09-11-1946    ADMISSION DATE:  09/08/2016 CONSULTATION DATE:  12/1  REFERRING MD :  Rozanna Box   CHIEF COMPLAINT:  Lactic acid    HISTORY OF PRESENT ILLNESS:   This is a 70 year old pt who resides a SNF. Has sig h/o seizure d/o, developmental delay, CAF, dementia and hypothyroidism. Presented from SNF w/ fever on 11/25. Was admitted w/ working dx of sepsis. Source not clear but cultures did grow out SA. had initially done better, was transferred to the floor and then on 11/30 got short of breath w/ concern for pulmonary edema. Was treated w/ lasix, but also had new lactic acid checked. Initially her LA was elevated on admit but cleared appropriately from 8.3 down to 5.9. Lactic acid continued to be trended and along w/ escalating work of breathing the lactic acid once again began to climb. We were asked to see on consult on 12/1 to help explain the rising LA trend.    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  has a past surgical history that includes midline incision; Central venous catheter insertion (05/15/2014); Esophagogastroduodenoscopy (N/A, 06/03/2014); and Gastrostomy tube placement. Prior to Admission medications   Medication Sig Start Date End Date Taking? Authorizing Provider  acetaminophen (TYLENOL) 650 MG suppository Place 1 suppository (650 mg total) rectally every 6 (six) hours as needed for mild pain (or Fever >/= 101). 06/07/16  Yes Jessica U Vann, DO  Amino Acids-Protein Hydrolys (FEEDING  SUPPLEMENT, PRO-STAT SUGAR FREE 64,) LIQD Place 30 mLs into feeding tube daily. 02/14/16  Yes Ripudeep Krystal Eaton, MD  clonazePAM (KLONOPIN) 1 MG tablet Place 1 mg into feeding tube every 8 (eight) hours.   Yes Historical Provider, MD  docusate (COLACE) 50 MG/5ML liquid Place 20 mLs (200 mg total) into feeding tube 2 (two) times daily. 06/09/14  Yes Bonnielee Haff, MD  famotidine (PEPCID) 20 MG tablet Give 1 tablet via peg tube daily ( 20 mg )    Yes Historical Provider, MD  furosemide (LASIX) 20 MG tablet 20 mg by PEG Tube route 2 (two) times daily.   Yes Historical Provider, MD  levETIRAcetam (KEPPRA) 100 MG/ML solution Place 5 mLs (500 mg total) into feeding tube daily at 6 (six) AM. 06/09/14  Yes Bonnielee Haff, MD  levothyroxine (SYNTHROID, LEVOTHROID) 100 MCG tablet 100 mcg by PEG Tube route daily before breakfast.   Yes Historical Provider, MD  metoprolol tartrate (LOPRESSOR) 25 MG tablet 12.5 mg by PEG Tube route 3 (three) times daily.   Yes Historical Provider, MD  Multiple Vitamins-Minerals (CENTAMIN) LIQD Place 5 mLs into feeding tube daily.   Yes Historical Provider, MD  Nutritional Supplements (NUTRITIONAL SUPPLEMENT PO) Two cal hn via peg at 39 cc/hr x 24 hours continuously   Yes Historical Provider, MD  potassium chloride 20 MEQ/15ML (10%) SOLN 20 mEq by PEG Tube route daily. Dilute with water before administered.   Yes Historical Provider, MD  Valproic Acid (DEPAKENE) 250 MG/5ML SYRP syrup Place 15 mLs (750 mg total) into feeding tube 3 (three) times daily. 06/09/14  Yes Bonnielee Haff, MD  Water For Irrigation, Sterile (FREE WATER) SOLN Place 100 mLs  into feeding tube every 4 (four) hours. 06/07/16   Geradine Girt, DO   Allergies  Allergen Reactions  . Carvedilol   . Ppd [Tuberculin Purified Protein Derivative]     Per MAR    FAMILY HISTORY:  family history includes Colon cancer in her mother; Diabetes type II in her sister. SOCIAL HISTORY:  reports that she has never smoked. She has  never used smokeless tobacco. She reports that she does not drink alcohol or use drugs.  REVIEW OF SYSTEMS:   Constitutional: Negative for fever, chills, weight loss, malaise/fatigue and diaphoresis.  HENT: Negative for hearing loss, ear pain, nosebleeds, congestion, sore throat, neck pain, tinnitus and ear discharge.   Eyes: Negative for blurred vision, double vision, photophobia, pain, discharge and redness.  Respiratory: Negative for cough, hemoptysis, sputum production, shortness of breath, wheezing and stridor.   Cardiovascular: Negative for chest pain, palpitations, orthopnea, claudication, leg swelling and PND.  Gastrointestinal: Negative for heartburn, nausea, vomiting, abdominal pain, diarrhea, constipation, blood in stool and melena.  Genitourinary: Negative for dysuria, urgency, frequency, hematuria and flank pain.  Musculoskeletal: Negative for myalgias, back pain, joint pain and falls.  Skin: Negative for itching and rash.  Neurological: Negative for dizziness, tingling, tremors, sensory change, speech change, focal weakness, seizures, loss of consciousness, weakness and headaches.  Endo/Heme/Allergies: Negative for environmental allergies and polydipsia. Does not bruise/bleed easily.  SUBJECTIVE:   VITAL SIGNS: Temp:  [97.7 F (36.5 C)-99.2 F (37.3 C)] 97.9 F (36.6 C) (12/01 0655) Pulse Rate:  [109-116] 116 (12/01 0655) Resp:  [16-18] 16 (12/01 0655) BP: (95-132)/(60-77) 95/77 (12/01 0655) SpO2:  [96 %-99 %] 96 % (12/01 0655)  PHYSICAL EXAMINATION: General:  In bed, contracted Neuro:  Not follow commands, eyes open, groan? HEENT:  jvd  Cardiovascular:  s1 s2 RRr big murmur Lungs:  Coarse bases,  Abdomen:  Soft bs wnl, no r Musculoskeletal:  Mild edema Skin:  No rash   Recent Labs Lab 09/11/16 0335 09/12/16 1353 09/14/16 0426  NA 142 138 141  K 3.5 3.3* 4.1  CL 108 107 98*  CO2 29 24 30   BUN 18 13 20   CREATININE 0.55 0.55 0.70  GLUCOSE 146* 119* 149*     Recent Labs Lab 09/12/16 1111 09/13/16 0514 09/14/16 0426  HGB 10.0* 10.6* 10.8*  HCT 30.1* 32.2* 32.8*  WBC 14.1* 15.7* 16.8*  PLT 292 300 317   Dg Chest Port 1 View  Result Date: 09/12/2016 CLINICAL DATA:  Shortness of Breath EXAM: PORTABLE CHEST 1 VIEW COMPARISON:  09/11/2016 FINDINGS: Cardiac shadow is mildly enlarged but stable. The overall inspiratory effort is poor with crowding of the vascular markings. Bibasilar atelectatic changes are seen. The known pleural effusions are not as well appreciated as on the prior CT examination. No bony abnormality is seen. IMPRESSION: Stable bibasilar atelectasis. Overall poor inspiratory effort with crowding of the vascular markings. The known pleural effusions are not well appreciated. Electronically Signed   By: Inez Catalina M.D.   On: 09/12/2016 14:17    ASSESSMENT / PLAN: Septic shock-->resolved SA bacteremia  Possible HCAP Anemia of chronic disease  Hypokalemia  Dementia Hypothyroidism  dnr status  RE: Lactic acidosis Initially this was related to organ hypoperfusion in setting of septic shock. This cleared appropriately w/ hydration and empiric abx. Subsequent lactic acidosis was checked during which time she was developing increased work of breathing d/t volume overload. I do not believe that the current elevated lactate represents on-going sepsis but rather it is  a result of respiratory failure. Suspect that if a lactic acid had been checked at the height of her work of breathing we would have seen her  Lactic acid was higher and the elevated LA level of 6.3 actually represents an improvement. At any rate this lactic acid DOES NOT REPRESENT WORSENING SEPSIS.   Plan Cont current abx-->narrow as able  Keep euvolemic  Do not check further  Would get echo to r/o endocarditis  Stop checking lactic acids   Erick Colace ACNP-BC [place] Pager # [phone] OR # [phone] if no answer   09/14/2016, 11:35  AM   STAFF NOTE: I, Merrie Roof, MD FACP have personally reviewed patient's available data, including medical history, events of note, physical examination and test results as part of my evaluation. I have discussed with resident/NP and other care providers such as pharmacist, RN and RRT. In addition, I personally evaluated patient and elicited key findings of: not following commands, abdo soft, lungs coarse crackles bases, loud sys murmur, clinical course has been sepsis treated abx unclear source, lactic acid has been elevated flat, pancrease slight inflammation, Lactic is unclear, may have been from increase WOB after pulm edema, LFt seem to be wnl, prognosis is poor and horrible baseline quality of life, would NOt be aggressive further as would NOT change outcome, would not order lactic acid further, would assess amy, lip,ldh, CT was done and is reassuring, no fevers, maintain abx for empiric course, coag Neg staph is pos on 2 occasions and should be treated as pathogen , get ID consult, echo, I am unaware of any hardware issues  Call if needed in future  Lavon Paganini. Titus Mould, MD, [place] Pgr: [facility] 09/14/2016 2:48 PM

## 2016-09-15 LAB — CULTURE, BLOOD (ROUTINE X 2): Culture: NO GROWTH

## 2016-09-15 MED ORDER — DILTIAZEM HCL 25 MG/5ML IV SOLN
10.0000 mg | Freq: Four times a day (QID) | INTRAVENOUS | Status: DC | PRN
  Administered 2016-09-15 – 2016-09-16 (×2): 10 mg via INTRAVENOUS
  Filled 2016-09-15 (×4): qty 5

## 2016-09-15 MED ORDER — DILTIAZEM HCL 25 MG/5ML IV SOLN
10.0000 mg | Freq: Once | INTRAVENOUS | Status: AC
  Administered 2016-09-15: 10 mg via INTRAVENOUS
  Filled 2016-09-15: qty 5

## 2016-09-15 MED ORDER — IBUPROFEN 100 MG/5ML PO SUSP
400.0000 mg | Freq: Once | ORAL | Status: AC
  Administered 2016-09-15: 400 mg via ORAL
  Filled 2016-09-15: qty 20

## 2016-09-15 MED ORDER — IBUPROFEN 200 MG PO TABS: 400.0000 mg | ORAL_TABLET | Freq: Once | ORAL | Status: DC

## 2016-09-15 NOTE — Progress Notes (Signed)
Pt in ST 130s sustaining.  Dr Clementeen Graham aware.  New order for Cardizem noted.  Will administer.

## 2016-09-15 NOTE — Progress Notes (Signed)
PROGRESS NOTE                                                                                                                                                                                                             Patient Demographics:    Dawn [name], is a 70 y.o. female, DOB - 1946-03-02, [id number]  Admit date - 09/08/2016   Admitting Physician Velvet Bathe, MD  Outpatient Primary MD for the patient is Inocencio Homes, MD  LOS - 7  Outpatient Specialists: none  Chief Complaint  Patient presents with  . Altered Mental Status  . Code Sepsis  . Hyperventilating       Brief Narrative   70 year old female with developmental delay, seizure disorder, dementia, A. fib, hypothyroidism who was sent from SNF with fever. She was hospitalized in August this year with aspiration pneumonia. Patient septic in the ED with hypotension, tachycardia, tachypnea and fever of 103.60F. Blood work showed WBC of 20 7.8K, lactic acid of 8.3 and potassium of 5.5. She was started on empiric vancomycin and Zosyn. Patient admitted to stepdown unit and transferred to medical floor 2 days later.    Subjective:   Breathing better but remains septic and febrile.   Assessment  & Plan :    Principal Problem:  Severe Sepsis ([employer]) Source unclear.Completed 7 days of empiric vancomycin and Zosyn and transitioned to oral Levaquin. Blood culture 1/2 on both occasion going quite-negative staph. Respiratory viral panel negative. 2-D echo without vegetation. CT of the chest abdomen and pelvis unremarkable. Ultrasound abdomen unremarkable for source of infection. No skin wound are ulcers. No joint swelling or tenderness. -Given her significant deconditioning we'll plan on conservative measures, supportive care with Tylenol and complete 10 days of antibiotic course.  Active Problems: Acute respiratory failure with hypoxia Secondary to volume  overload. Diuresing well with IV Lasix, continue current dose. 2-D echo with EF 65-70% and no wall motion abnormality. Continue Foley for now.    Seizure disorder (HCC) Continue Keppra, Depakote and clonazepam.    Hypothyroidism Continue Synthroid.  Essential hypertension Metoprolol on hold due to hypotension.  Hypokalemia Replenished    Code Status : DO NOT RESUSCITATE  Family Communication  : Discussed in detail with sister who is her legal guardian. She understands patient's significant comorbidities and poor quality of life. Discussed again about goals  of care and she agrees that once patient is more stable she can be sent back to SNF with palliative care follow-up with goals on keeping her comfortable.  Disposition Plan : return to SNF once sepsis  resolves  Barriers For Discharge : active symptoms  Consults  :   PC CM  Procedures  : Ultrasound abdomen CT chest abdomen and pelvis 2-D echo  DVT Prophylaxis  :  Lovenox -   Lab Results  Component Value Date   PLT 317 09/14/2016    Antibiotics  :   Anti-infectives    Start     Dose/Rate Route Frequency Ordered Stop   09/14/16 2200  levofloxacin (LEVAQUIN) tablet 750 mg     750 mg Oral Daily 09/14/16 1532 09/17/16 0959   09/11/16 1100  vancomycin (VANCOCIN) IVPB 750 mg/150 ml premix  Status:  Discontinued     750 mg 150 mL/hr over 60 Minutes Intravenous Every 12 hours 09/11/16 1035 09/14/16 1530   09/09/16 2100  vancomycin (VANCOCIN) 500 mg in sodium chloride 0.9 % 100 mL IVPB  Status:  Discontinued     500 mg 100 mL/hr over 60 Minutes Intravenous Every 12 hours 09/09/16 2050 09/11/16 1035   09/09/16 0200  vancomycin (VANCOCIN) 500 mg in sodium chloride 0.9 % 100 mL IVPB  Status:  Discontinued     500 mg 100 mL/hr over 60 Minutes Intravenous Every 12 hours 09/08/16 1437 09/09/16 1123   09/08/16 2000  piperacillin-tazobactam (ZOSYN) IVPB 3.375 g  Status:  Discontinued     3.375 g 12.5 mL/hr over 240 Minutes  Intravenous Every 8 hours 09/08/16 1437 09/14/16 1530   09/08/16 1345  piperacillin-tazobactam (ZOSYN) IVPB 3.375 g     3.375 g 100 mL/hr over 30 Minutes Intravenous  Once 09/08/16 1340 09/08/16 1415   09/08/16 1345  vancomycin (VANCOCIN) IVPB 1000 mg/200 mL premix     1,000 mg 200 mL/hr over 60 Minutes Intravenous STAT 09/08/16 1340 09/08/16 1453        Objective:   Vitals:   09/15/16 0536 09/15/16 0715 09/15/16 1140 09/15/16 1331  BP:   110/64 102/80  Pulse:   (!) 128 (!) 127  Resp: (!) 24   (!) 22  Temp:  100 F (37.8 C) (!) 102.4 F (39.1 C) 99.1 F (37.3 C)  TempSrc:  Axillary Axillary Axillary  SpO2:   96% 99%  Weight:      Height:        Wt Readings from Last 3 Encounters:  09/08/16 67.7 kg (149 lb 4 oz)  08/23/16 65.4 kg (144 lb 3.2 oz)  07/25/16 63.5 kg (140 lb)     Intake/Output Summary (Last 24 hours) at 09/15/16 1410 Last data filed at 09/15/16 1121  Gross per 24 hour  Intake             1040 ml  Output             4000 ml  Net            -2960 ml     Physical Exam  [id number], , Poorly arousable HEENT: Moist mucosa, supple neck,  Chest: Scattered rhonchi bilaterally CVS: S1 and S2 tachycardic, no murmurs or gallop GI: soft, NT, ND, BS+, G-tube in place ,, Foley placed  Musculoskeletal: warm , 1+ pitting edema bilaterally CNS: Poorly arousable, nonverbal,    Data Review:    CBC  Recent Labs Lab 09/11/16 0335 09/12/16 0515 09/12/16 1111 09/13/16 0514 09/14/16 0426  WBC  16.6* 16.2* 14.1* 15.7* 16.8*  HGB 10.2* 10.4* 10.0* 10.6* 10.8*  HCT 31.2* 32.2* 30.1* 32.2* 32.8*  PLT 278 297 292 300 317  MCV 102.3* 101.3* 101.3* 100.9* 100.9*  MCH 33.4 32.7 33.7 33.2 33.2  MCHC 32.7 32.3 33.2 32.9 32.9  RDW 16.1* 16.1* 16.1* 16.3* 16.2*    Chemistries   Recent Labs Lab 09/09/16 0354 09/10/16 0340 09/11/16 0335 09/12/16 1353 09/14/16 0426  NA 136 139 142 138 141  K 4.0 3.7 3.5 3.3* 4.1  CL 103 104 108 107 98*  CO2 25 25 29 24 30    GLUCOSE 93 161* 146* 119* 149*  BUN 22* 20 18 13 20   CREATININE 0.72 0.68 0.55 0.55 0.70  CALCIUM 8.1* 8.2* 8.3* 8.2* 8.2*  AST  --   --   --  98*  --   ALT  --   --   --  43  --   ALKPHOS  --   --   --  394*  --   BILITOT  --   --   --  1.5*  --    ------------------------------------------------------------------------------------------------------------------ No results for input(s): CHOL, HDL, LDLCALC, TRIG, CHOLHDL, LDLDIRECT in the last 72 hours.  No results found for: HGBA1C ------------------------------------------------------------------------------------------------------------------ No results for input(s): TSH, T4TOTAL, T3FREE, THYROIDAB in the last 72 hours.  Invalid input(s): FREET3 ------------------------------------------------------------------------------------------------------------------ No results for input(s): VITAMINB12, FOLATE, FERRITIN, TIBC, IRON, RETICCTPCT in the last 72 hours.  Coagulation profile  Recent Labs Lab 09/08/16 1450  INR 1.31     Recent Labs  09/14/16 0920  DDIMER 1.05*    Cardiac Enzymes No results for input(s): CKMB, TROPONINI, MYOGLOBIN in the last 168 hours.  Invalid input(s): CK ------------------------------------------------------------------------------------------------------------------    Component Value Date/Time   BNP 257.3 (H) 10/27/2014 0015    Inpatient Medications  Scheduled Meds: . chlorhexidine  15 mL Mouth Rinse BID  . clonazePAM  1 mg Per Tube Q8H  . docusate  200 mg Per Tube BID  . feeding supplement (PRO-STAT SUGAR FREE 64)  30 mL Per Tube Daily  . free water  100 mL Per Tube Q4H  . furosemide  80 mg Intravenous Q12H  . levETIRAcetam  500 mg Per Tube Q0600  . levofloxacin  750 mg Oral Daily  . levothyroxine  100 mcg Oral QAC breakfast  . mouth rinse  15 mL Mouth Rinse q12n4p  . potassium chloride  40 mEq Oral Daily  . ranitidine  150 mg Per Tube QHS  . sodium chloride flush  3 mL  Intravenous Q12H  . TWOCAL HN  1,000 mL Per Tube Q24H  . Valproate Sodium  750 mg Per Tube TID   Continuous Infusions: PRN Meds:.sodium chloride, acetaminophen **OR** acetaminophen, albuterol, iopamidol, sodium chloride flush  Micro Results Recent Results (from the past 240 hour(s))  Culture, blood (Routine x 2)     Status: None   Collection Time: 09/08/16  1:06 PM  Result Value Ref Range Status   Specimen Description BLOOD LEFT HAND  Final   Special Requests BOTTLES DRAWN AEROBIC AND ANAEROBIC 5CC  Final   Culture   Final    NO GROWTH 5 DAYS Performed at [facility]    Report Status 09/13/2016 FINAL  Final  Culture, blood (Routine x 2)     Status: Abnormal   Collection Time: 09/08/16  1:09 PM  Result Value Ref Range Status   Specimen Description BLOOD RIGHT ANTECUBITAL  Final   Special Requests BOTTLES DRAWN AEROBIC AND  ANAEROBIC 5CC  Final   Culture  Setup Time   Final    GRAM POSITIVE COCCI IN CLUSTERS IN BOTH AEROBIC AND ANAEROBIC BOTTLES CRITICAL RESULT CALLED TO, READ BACK BY AND VERIFIED WITH: N GLOGOVAC,PHARMD AT 1253 09/09/16 BY L BENFIELD    Culture (A)  Final    STAPHYLOCOCCUS SPECIES (COAGULASE NEGATIVE) THE SIGNIFICANCE OF ISOLATING THIS ORGANISM FROM A SINGLE SET OF BLOOD CULTURES WHEN MULTIPLE SETS ARE DRAWN IS UNCERTAIN. PLEASE NOTIFY THE MICROBIOLOGY DEPARTMENT WITHIN ONE WEEK IF SPECIATION AND SENSITIVITIES ARE REQUIRED. Performed at [facility]    Report Status 09/11/2016 FINAL  Final  Blood Culture ID Panel (Reflexed)     Status: Abnormal   Collection Time: 09/08/16  1:09 PM  Result Value Ref Range Status   Enterococcus species NOT DETECTED NOT DETECTED Final   Listeria monocytogenes NOT DETECTED NOT DETECTED Final   Staphylococcus species DETECTED (A) NOT DETECTED Final    Comment: CRITICAL RESULT CALLED TO, READ BACK BY AND VERIFIED WITH: N GLOGOVAC,PHARMD AT 1253 09/09/16 BY L BENFIELD    Staphylococcus aureus NOT DETECTED NOT  DETECTED Final   Methicillin resistance NOT DETECTED NOT DETECTED Final   Streptococcus species NOT DETECTED NOT DETECTED Final   Streptococcus agalactiae NOT DETECTED NOT DETECTED Final   Streptococcus pneumoniae NOT DETECTED NOT DETECTED Final   Streptococcus pyogenes NOT DETECTED NOT DETECTED Final   Acinetobacter baumannii NOT DETECTED NOT DETECTED Final   Enterobacteriaceae species NOT DETECTED NOT DETECTED Final   Enterobacter cloacae complex NOT DETECTED NOT DETECTED Final   Escherichia coli NOT DETECTED NOT DETECTED Final   Klebsiella oxytoca NOT DETECTED NOT DETECTED Final   Klebsiella pneumoniae NOT DETECTED NOT DETECTED Final   Proteus species NOT DETECTED NOT DETECTED Final   Serratia marcescens NOT DETECTED NOT DETECTED Final   Haemophilus influenzae NOT DETECTED NOT DETECTED Final   Neisseria meningitidis NOT DETECTED NOT DETECTED Final   Pseudomonas aeruginosa NOT DETECTED NOT DETECTED Final   Candida albicans NOT DETECTED NOT DETECTED Final   Candida glabrata NOT DETECTED NOT DETECTED Final   Candida krusei NOT DETECTED NOT DETECTED Final   Candida parapsilosis NOT DETECTED NOT DETECTED Final   Candida tropicalis NOT DETECTED NOT DETECTED Final    Comment: Performed at [facility]  Urine culture     Status: None   Collection Time: 09/08/16  3:00 PM  Result Value Ref Range Status   Specimen Description URINE, CATHETERIZED  Final   Special Requests NONE  Final   Culture NO GROWTH Performed at [facility]   Final   Report Status 09/09/2016 FINAL  Final  MRSA PCR Screening     Status: None   Collection Time: 09/08/16 10:21 PM  Result Value Ref Range Status   MRSA by PCR NEGATIVE NEGATIVE Final    Comment:        The GeneXpert MRSA Assay (FDA approved for NASAL specimens only), is one component of a comprehensive MRSA colonization surveillance program. It is not intended to diagnose MRSA infection nor to guide or monitor treatment for MRSA  infections.   Respiratory Panel by PCR     Status: None   Collection Time: 09/09/16  9:43 AM  Result Value Ref Range Status   Adenovirus NOT DETECTED NOT DETECTED Final   Coronavirus 229E NOT DETECTED NOT DETECTED Final   Coronavirus HKU1 NOT DETECTED NOT DETECTED Final   Coronavirus NL63 NOT DETECTED NOT DETECTED Final   Coronavirus OC43 NOT DETECTED NOT DETECTED  Final   Metapneumovirus NOT DETECTED NOT DETECTED Final   Rhinovirus / Enterovirus NOT DETECTED NOT DETECTED Final   Influenza A NOT DETECTED NOT DETECTED Final   Influenza B NOT DETECTED NOT DETECTED Final   Parainfluenza Virus 1 NOT DETECTED NOT DETECTED Final   Parainfluenza Virus 2 NOT DETECTED NOT DETECTED Final   Parainfluenza Virus 3 NOT DETECTED NOT DETECTED Final   Parainfluenza Virus 4 NOT DETECTED NOT DETECTED Final   Respiratory Syncytial Virus NOT DETECTED NOT DETECTED Final   Bordetella pertussis NOT DETECTED NOT DETECTED Final   Chlamydophila pneumoniae NOT DETECTED NOT DETECTED Final   Mycoplasma pneumoniae NOT DETECTED NOT DETECTED Final    Comment: Performed at [facility]  Culture, blood (routine x 2)     Status: Abnormal   Collection Time: 09/10/16 10:29 AM  Result Value Ref Range Status   Specimen Description BLOOD RIGHT HAND  Final   Special Requests IN PEDIATRIC BOTTLE 4CC  Final   Culture  Setup Time   Final    GRAM POSITIVE COCCI IN CLUSTERS IN PEDIATRIC BOTTLE CRITICAL RESULT CALLED TO, READ BACK BY AND VERIFIED WITH: Chales Abrahams, [place] AT [place] ON [id number] BY Rhea Bleacher    Culture (A)  Final    STAPHYLOCOCCUS SPECIES (COAGULASE NEGATIVE) SUSCEPTIBILITIES REQUESTED BY PHARMACY Performed at [facility]    Report Status 09/14/2016 FINAL  Final   Organism ID, Bacteria STAPHYLOCOCCUS SPECIES (COAGULASE NEGATIVE)  Final      Susceptibility   Staphylococcus species (coagulase negative) - MIC*    CIPROFLOXACIN >=8 RESISTANT Resistant     ERYTHROMYCIN >=8 RESISTANT Resistant      GENTAMICIN <=0.5 SENSITIVE Sensitive     OXACILLIN 0.5 RESISTANT Resistant     TETRACYCLINE <=1 SENSITIVE Sensitive     VANCOMYCIN 1 SENSITIVE Sensitive     TRIMETH/SULFA <=10 SENSITIVE Sensitive     CLINDAMYCIN <=0.25 RESISTANT Resistant     RIFAMPIN <=0.5 SENSITIVE Sensitive     Inducible Clindamycin POSITIVE Resistant     * STAPHYLOCOCCUS SPECIES (COAGULASE NEGATIVE)  Culture, blood (routine x 2)     Status: None (Preliminary result)   Collection Time: 09/10/16 10:37 AM  Result Value Ref Range Status   Specimen Description BLOOD LEFT HAND  Final   Special Requests BOTTLES DRAWN AEROBIC ONLY 5CC  Final   Culture   Final    NO GROWTH 4 DAYS Performed at [facility]    Report Status PENDING  Incomplete  Respiratory Panel by PCR     Status: None   Collection Time: 09/13/16  8:14 AM  Result Value Ref Range Status   Adenovirus NOT DETECTED NOT DETECTED Final   Coronavirus 229E NOT DETECTED NOT DETECTED Final   Coronavirus HKU1 NOT DETECTED NOT DETECTED Final   Coronavirus NL63 NOT DETECTED NOT DETECTED Final   Coronavirus OC43 NOT DETECTED NOT DETECTED Final   Metapneumovirus NOT DETECTED NOT DETECTED Final   Rhinovirus / Enterovirus NOT DETECTED NOT DETECTED Final   Influenza A NOT DETECTED NOT DETECTED Final   Influenza B NOT DETECTED NOT DETECTED Final   Parainfluenza Virus 1 NOT DETECTED NOT DETECTED Final   Parainfluenza Virus 2 NOT DETECTED NOT DETECTED Final   Parainfluenza Virus 3 NOT DETECTED NOT DETECTED Final   Parainfluenza Virus 4 NOT DETECTED NOT DETECTED Final   Respiratory Syncytial Virus NOT DETECTED NOT DETECTED Final   Bordetella pertussis NOT DETECTED NOT DETECTED Final   Chlamydophila pneumoniae NOT DETECTED NOT DETECTED Final  Mycoplasma pneumoniae NOT DETECTED NOT DETECTED Final    Comment: Performed at  [facility]    Radiology Reports Ct Chest W Contrast  Result Date: 09/11/2016 CLINICAL DATA:  Sepsis EXAM: CT CHEST, ABDOMEN, AND  PELVIS WITH CONTRAST TECHNIQUE: Multidetector CT imaging of the chest, abdomen and pelvis was performed following the standard protocol during bolus administration of intravenous contrast. CONTRAST:  168mL ISOVUE-300 IOPAMIDOL (ISOVUE-300) INJECTION 61% COMPARISON:  06/01/2014 FINDINGS: CT CHEST FINDINGS Cardiovascular: There is no evidence of aortic aneurysm, dissection, or intramural hematoma. Mild coronary artery calcifications in the LAD territory. Mitral annular calcification. Mediastinum/Nodes: No evidence of abnormal mediastinal adenopathy no pericardial effusion. No abnormal axillary adenopathy. Lungs/Pleura: Small bilateral pleural effusions. No pneumothorax. There is dependent atelectasis bilaterally. Hazy ground-glass throughout the remainder of the lungs likely reflects hypoaeration. Musculoskeletal: There is no vertebral compression deformity in the thoracic spine. CT ABDOMEN PELVIS FINDINGS Hepatobiliary: Diffuse hepatic steatosis.  Postcholecystectomy. Pancreas: There is stranding surrounding the head and body of the pancreas. There is no evidence of pancreatic necrosis or hemorrhage. There is no obvious focal mass in the pancreas. Heterogeneity may simply reflect streak artifact. Spleen: Unremarkable. Adrenals/Urinary Tract: Left kidney remains severely atrophic. Adrenal glands are unremarkable. There is hypertrophy of the right kidney. Bladder is within normal limits. Stomach/Bowel: Gastrostomy tube is in place. There is a foreign body in the stomach that is unchanged compare with the prior study the colon is decompressed. The appendix is within normal limits and contains contrast. There is no obvious mass in the colon. No evidence of small-bowel obstruction. Vascular/Lymphatic: There is no evidence of aortic aneurysm. Mild atherosclerotic calcifications of the aorta and iliac vasculature are noted. No abnormal retroperitoneal adenopathy. Small para-aortic and mesenteric nodes. Reproductive: Uterus  contains a single calcification likely related to an old fibroid. No obvious ovarian mass. Other: There is a small amount of free fluid in the upper pelvis and right lower quadrant of the abdomen associated with distal small bowel loops and the uterus. There is also stranding within the mesenteric fat as well as along the course of the ascending and descending colon. There is stranding of the subcutaneous fat as well. These findings may all simply represent volume overload state. Inflammatory process is not excluded. Musculoskeletal: No vertebral compression deformity. IMPRESSION: Small bilateral pleural effusions and dependent atelectasis. Small amount of free fluid in the lower abdomen and pelvis. This is nonspecific and may be related to volume overload. An inflammatory process is not excluded. Foreign body remains in the stomach Create Gastrostomy tube is in place. There is stranding about the pancreas which may be related to volume overload. A focal inflammatory process is not excluded. Electronically Signed   By: Marybelle Killings M.D.   On: 09/11/2016 12:41   Ct Abdomen Pelvis W Contrast  Result Date: 09/11/2016 CLINICAL DATA:  Sepsis EXAM: CT CHEST, ABDOMEN, AND PELVIS WITH CONTRAST TECHNIQUE: Multidetector CT imaging of the chest, abdomen and pelvis was performed following the standard protocol during bolus administration of intravenous contrast. CONTRAST:  170mL ISOVUE-300 IOPAMIDOL (ISOVUE-300) INJECTION 61% COMPARISON:  06/01/2014 FINDINGS: CT CHEST FINDINGS Cardiovascular: There is no evidence of aortic aneurysm, dissection, or intramural hematoma. Mild coronary artery calcifications in the LAD territory. Mitral annular calcification. Mediastinum/Nodes: No evidence of abnormal mediastinal adenopathy no pericardial effusion. No abnormal axillary adenopathy. Lungs/Pleura: Small bilateral pleural effusions. No pneumothorax. There is dependent atelectasis bilaterally. Hazy ground-glass throughout the  remainder of the lungs likely reflects hypoaeration. Musculoskeletal: There is no vertebral compression deformity  in the thoracic spine. CT ABDOMEN PELVIS FINDINGS Hepatobiliary: Diffuse hepatic steatosis.  Postcholecystectomy. Pancreas: There is stranding surrounding the head and body of the pancreas. There is no evidence of pancreatic necrosis or hemorrhage. There is no obvious focal mass in the pancreas. Heterogeneity may simply reflect streak artifact. Spleen: Unremarkable. Adrenals/Urinary Tract: Left kidney remains severely atrophic. Adrenal glands are unremarkable. There is hypertrophy of the right kidney. Bladder is within normal limits. Stomach/Bowel: Gastrostomy tube is in place. There is a foreign body in the stomach that is unchanged compare with the prior study the colon is decompressed. The appendix is within normal limits and contains contrast. There is no obvious mass in the colon. No evidence of small-bowel obstruction. Vascular/Lymphatic: There is no evidence of aortic aneurysm. Mild atherosclerotic calcifications of the aorta and iliac vasculature are noted. No abnormal retroperitoneal adenopathy. Small para-aortic and mesenteric nodes. Reproductive: Uterus contains a single calcification likely related to an old fibroid. No obvious ovarian mass. Other: There is a small amount of free fluid in the upper pelvis and right lower quadrant of the abdomen associated with distal small bowel loops and the uterus. There is also stranding within the mesenteric fat as well as along the course of the ascending and descending colon. There is stranding of the subcutaneous fat as well. These findings may all simply represent volume overload state. Inflammatory process is not excluded. Musculoskeletal: No vertebral compression deformity. IMPRESSION: Small bilateral pleural effusions and dependent atelectasis. Small amount of free fluid in the lower abdomen and pelvis. This is nonspecific and may be related to  volume overload. An inflammatory process is not excluded. Foreign body remains in the stomach Create Gastrostomy tube is in place. There is stranding about the pancreas which may be related to volume overload. A focal inflammatory process is not excluded. Electronically Signed   By: Marybelle Killings M.D.   On: 09/11/2016 12:41   Dg Chest Port 1 View  Result Date: 09/12/2016 CLINICAL DATA:  Shortness of Breath EXAM: PORTABLE CHEST 1 VIEW COMPARISON:  09/11/2016 FINDINGS: Cardiac shadow is mildly enlarged but stable. The overall inspiratory effort is poor with crowding of the vascular markings. Bibasilar atelectatic changes are seen. The known pleural effusions are not as well appreciated as on the prior CT examination. No bony abnormality is seen. IMPRESSION: Stable bibasilar atelectasis. Overall poor inspiratory effort with crowding of the vascular markings. The known pleural effusions are not well appreciated. Electronically Signed   By: Inez Catalina M.D.   On: 09/12/2016 14:17   Dg Chest Portable 1 View  Result Date: 09/08/2016 CLINICAL DATA:  70 year old female with a history of sepsis EXAM: PORTABLE CHEST 1 VIEW COMPARISON:  06/03/2016, 02/08/2016, 11/11/2014 FINDINGS: Significant right rotation somewhat limits evaluation. Low lung volumes accentuating the interstitium. Interstitial opacities bilaterally. No pneumothorax identified.  No large pleural effusion. No large confluent airspace disease. IMPRESSION: Limited exam given the positioning with low lung volumes and likely atelectasis. No definite lobar pneumonia. Signed, Dulcy Fanny. Earleen Newport, DO [facility] Electronically Signed   By: Corrie Mckusick D.O.   On: 09/08/2016 13:31   US Abdomen Limited Ruq  Result Date: 09/09/2016 CLINICAL DATA:  Sepsis.  Prior cholecystectomy. EXAM: US ABDOMEN LIMITED - RIGHT UPPER QUADRANT COMPARISON:  None. FINDINGS: Limited exam due to body habitus. Gallbladder:  Surgically absent. Common bile duct: Diameter: 2.3 mm Liver: Diffusely increased in echogenicity. IMPRESSION: Surgically absent gallbladder.  No biliary ductal dilatation. Hepatic steatosis. Electronically Signed  By: Lovey Newcomer M.D.   On: 09/09/2016 10:44    Time Spent in minutes  35   Louellen Molder M.D on 09/15/2016 at 2:10 PM  Between 7am to 7pm - Pager - [phone]  After 7pm go to [email or web address] - password [facility]  [facility] -  Office  [phone]

## 2016-09-15 NOTE — Progress Notes (Signed)
Pt HR continues to sustain 130s though pt resting quietly.  Dr Clementeen Graham aware.  Order received for repeat dose of Cardizem IV and given.  Will monitor.

## 2016-09-15 NOTE — Progress Notes (Signed)
Pt warm to touch.  Axillary temp 102.4.  Dr Flonnie Overman made aware.  No new orders.  Will give Tylenol supp.

## 2016-09-16 ENCOUNTER — Encounter (HOSPITAL_COMMUNITY): Payer: Self-pay | Admitting: Radiology

## 2016-09-16 ENCOUNTER — Inpatient Hospital Stay (HOSPITAL_COMMUNITY): Payer: Medicare Other

## 2016-09-16 DIAGNOSIS — R509 Fever, unspecified: Secondary | ICD-10-CM

## 2016-09-16 DIAGNOSIS — Z888 Allergy status to other drugs, medicaments and biological substances status: Secondary | ICD-10-CM

## 2016-09-16 DIAGNOSIS — Z931 Gastrostomy status: Secondary | ICD-10-CM

## 2016-09-16 DIAGNOSIS — D72829 Elevated white blood cell count, unspecified: Secondary | ICD-10-CM

## 2016-09-16 DIAGNOSIS — J810 Acute pulmonary edema: Secondary | ICD-10-CM

## 2016-09-16 DIAGNOSIS — H748X9 Other specified disorders of middle ear and mastoid, unspecified ear: Secondary | ICD-10-CM

## 2016-09-16 MED ORDER — SODIUM CHLORIDE 0.9 % IJ SOLN
INTRAMUSCULAR | Status: AC
  Filled 2016-09-16: qty 50

## 2016-09-16 MED ORDER — IOPAMIDOL (ISOVUE-370) INJECTION 76%
INTRAVENOUS | Status: AC
  Filled 2016-09-16: qty 100

## 2016-09-16 MED ORDER — IOPAMIDOL (ISOVUE-370) INJECTION 76%
100.0000 mL | Freq: Once | INTRAVENOUS | Status: AC | PRN
  Administered 2016-09-16: 100 mL via INTRAVENOUS

## 2016-09-16 MED ORDER — VANCOMYCIN HCL IN DEXTROSE 750-5 MG/150ML-% IV SOLN
750.0000 mg | Freq: Two times a day (BID) | INTRAVENOUS | Status: DC
  Administered 2016-09-16 – 2016-09-18 (×4): 750 mg via INTRAVENOUS
  Filled 2016-09-16 (×5): qty 150

## 2016-09-16 NOTE — Progress Notes (Signed)
Patient's temp 103.8, PRN tylenol given and Dr. Clementeen Graham notified, on the way to see patient and orders written. Will continue to monitor patient.

## 2016-09-16 NOTE — Progress Notes (Signed)
Patient heart rate is sustaining between 120-130s . Patient is resting with no observed c/o of pain. Temperature remains elevated. Orders given to treat temp. Will give PRN medication for HR per frequency  once available. Will cont to monitor

## 2016-09-16 NOTE — Progress Notes (Signed)
PROGRESS NOTE                                                                                                                                                                                                             Patient Demographics:    Dawn [name], is a 70 y.o. female, DOB - 07/07/1946, [id number]  Admit date - 09/08/2016   Admitting Physician Velvet Bathe, MD  Outpatient Primary MD for the patient is Inocencio Homes, MD  LOS - 8  Outpatient Specialists: none  Chief Complaint  Patient presents with  . Altered Mental Status  . Code Sepsis  . Hyperventilating       Brief Narrative   70 year old female with developmental delay, seizure disorder, dementia, A. fib, hypothyroidism who was sent from SNF with fever. She was hospitalized in August this year with aspiration pneumonia. Patient septic in the ED with hypotension, tachycardia, tachypnea and fever of 103.27F. Blood work showed WBC of 20 7.8K, lactic acid of 8.3 and potassium of 5.5. She was started on empiric vancomycin and Zosyn. Patient admitted to stepdown unit and transferred to medical floor 2 days later.    Subjective:   Continues to be tachycardic with high grade fever.   Assessment  & Plan :    Principal Problem:  Severe Sepsis ([place]) Source unclear. 1/2 blood cx growing coag neg staph. Completed 7 days of empiric vancomycin and Zosyn and transitioned to oral Levaquin.  Respiratory viral panel negative. 2-D echo without vegetation. CT of the chest abdomen and pelvis unremarkable. Ultrasound abdomen unremarkable for source of infection. No skin wound are ulcers. No joint swelling or tenderness. -doppler LE neg for DVT. CT angio chest neg for PE. CT head shows mastoid effusion. -ID consulted. Recommends starting back IV vancomycin, if the blood cx showed true infection. -if no improvement in 24-48, will aim for comfort measures.  Active  Problems: Acute respiratory failure with hypoxia Secondary to volume overload. IV lasix for diuresis. Marland Kitchen 2-D echo with EF 65-70% and no wall motion abnormality. Continue Foley for now.    Seizure disorder (HCC) Continue Keppra, Depakote and clonazepam.    Hypothyroidism Continue Synthroid.  Essential hypertension Metoprolol on hold due to hypotension.  Hypokalemia Replenished    Code Status : DO NOT RESUSCITATE  Family Communication  : Discussed in detail with sister who  is her legal guardian. She understands patient's significant comorbidities and poor quality of life. Discussed again about goals of care and she agrees that once patient is more stable she can be sent back to SNF with palliative care follow-up with goals on keeping her comfortable.  Disposition Plan : return to SNF with pall care follow up vs comfort measures if worsens  Barriers For Discharge : active symptoms  Consults  :   PC CM ID  Procedures  : Ultrasound abdomen CT chest abdomen and pelvis 2-D echo CT angio chest  doppler LE Head CT  DVT Prophylaxis  :  Lovenox -   Lab Results  Component Value Date   PLT 317 09/14/2016    Antibiotics  :   Anti-infectives    Start     Dose/Rate Route Frequency Ordered Stop   09/14/16 2200  levofloxacin (LEVAQUIN) tablet 750 mg  Status:  Discontinued     750 mg Oral Daily 09/14/16 1532 09/16/16 0759   09/11/16 1100  vancomycin (VANCOCIN) IVPB 750 mg/150 ml premix  Status:  Discontinued     750 mg 150 mL/hr over 60 Minutes Intravenous Every 12 hours 09/11/16 1035 09/14/16 1530   09/09/16 2100  vancomycin (VANCOCIN) 500 mg in sodium chloride 0.9 % 100 mL IVPB  Status:  Discontinued     500 mg 100 mL/hr over 60 Minutes Intravenous Every 12 hours 09/09/16 2050 09/11/16 1035   09/09/16 0200  vancomycin (VANCOCIN) 500 mg in sodium chloride 0.9 % 100 mL IVPB  Status:  Discontinued     500 mg 100 mL/hr over 60 Minutes Intravenous Every 12 hours 09/08/16 1437  09/09/16 1123   09/08/16 2000  piperacillin-tazobactam (ZOSYN) IVPB 3.375 g  Status:  Discontinued     3.375 g 12.5 mL/hr over 240 Minutes Intravenous Every 8 hours 09/08/16 1437 09/14/16 1530   09/08/16 1345  piperacillin-tazobactam (ZOSYN) IVPB 3.375 g     3.375 g 100 mL/hr over 30 Minutes Intravenous  Once 09/08/16 1340 09/08/16 1415   09/08/16 1345  vancomycin (VANCOCIN) IVPB 1000 mg/200 mL premix     1,000 mg 200 mL/hr over 60 Minutes Intravenous STAT 09/08/16 1340 09/08/16 1453        Objective:   Vitals:   09/16/16 0635 09/16/16 0839 09/16/16 1000 09/16/16 1137  BP:      Pulse:    (!) 111  Resp: (!) 42   (!) 22  Temp:  (!) 103.8 F (39.9 C) (!) 101.5 F (38.6 C)   TempSrc:  Rectal Rectal   SpO2: 99%     Weight:      Height:        Wt Readings from Last 3 Encounters:  09/08/16 67.7 kg (149 lb 4 oz)  08/23/16 65.4 kg (144 lb 3.2 oz)  07/25/16 63.5 kg (140 lb)     Intake/Output Summary (Last 24 hours) at 09/16/16 1322 Last data filed at 09/16/16 1000  Gross per 24 hour  Intake              512 ml  Output             1251 ml  Net             -739 ml     Physical Exam  [id number], , Poorly arousable HEENT: Moist mucosa, supple neck,  Chest: Scattered rhonchi bilaterally CVS: S1 and S2 tachycardic, no murmurs GI: soft, NT, ND, BS+, G-tube in place ,, Foley+  Musculoskeletal: warm ,  Trace edema b/l CNS:  nonverbal,    Data Review:    CBC  Recent Labs Lab 09/11/16 0335 09/12/16 0515 09/12/16 1111 09/13/16 0514 09/14/16 0426  WBC 16.6* 16.2* 14.1* 15.7* 16.8*  HGB 10.2* 10.4* 10.0* 10.6* 10.8*  HCT 31.2* 32.2* 30.1* 32.2* 32.8*  PLT 278 297 292 300 317  MCV 102.3* 101.3* 101.3* 100.9* 100.9*  MCH 33.4 32.7 33.7 33.2 33.2  MCHC 32.7 32.3 33.2 32.9 32.9  RDW 16.1* 16.1* 16.1* 16.3* 16.2*    Chemistries   Recent Labs Lab 09/10/16 0340 09/11/16 0335 09/12/16 1353 09/14/16 0426  NA 139 142 138 141  K 3.7 3.5 3.3* 4.1  CL 104 108 107  98*  CO2 25 29 24 30   GLUCOSE 161* 146* 119* 149*  BUN 20 18 13 20   CREATININE 0.68 0.55 0.55 0.70  CALCIUM 8.2* 8.3* 8.2* 8.2*  AST  --   --  98*  --   ALT  --   --  43  --   ALKPHOS  --   --  394*  --   BILITOT  --   --  1.5*  --    ------------------------------------------------------------------------------------------------------------------ No results for input(s): CHOL, HDL, LDLCALC, TRIG, CHOLHDL, LDLDIRECT in the last 72 hours.  No results found for: HGBA1C ------------------------------------------------------------------------------------------------------------------ No results for input(s): TSH, T4TOTAL, T3FREE, THYROIDAB in the last 72 hours.  Invalid input(s): FREET3 ------------------------------------------------------------------------------------------------------------------ No results for input(s): VITAMINB12, FOLATE, FERRITIN, TIBC, IRON, RETICCTPCT in the last 72 hours.  Coagulation profile No results for input(s): INR, PROTIME in the last 168 hours.   Recent Labs  09/14/16 0920  DDIMER 1.05*    Cardiac Enzymes No results for input(s): CKMB, TROPONINI, MYOGLOBIN in the last 168 hours.  Invalid input(s): CK ------------------------------------------------------------------------------------------------------------------    Component Value Date/Time   BNP 257.3 (H) 10/27/2014 0015    Inpatient Medications  Scheduled Meds: . chlorhexidine  15 mL Mouth Rinse BID  . clonazePAM  1 mg Per Tube Q8H  . docusate  200 mg Per Tube BID  . feeding supplement (PRO-STAT SUGAR FREE 64)  30 mL Per Tube Daily  . free water  100 mL Per Tube Q4H  . furosemide  80 mg Intravenous Q12H  . iopamidol      . levETIRAcetam  500 mg Per Tube Q0600  . levothyroxine  100 mcg Oral QAC breakfast  . mouth rinse  15 mL Mouth Rinse q12n4p  . potassium chloride  40 mEq Oral Daily  . ranitidine  150 mg Per Tube QHS  . sodium chloride      . sodium chloride flush  3 mL  Intravenous Q12H  . TWOCAL HN  1,000 mL Per Tube Q24H  . Valproate Sodium  750 mg Per Tube TID   Continuous Infusions: PRN Meds:.sodium chloride, acetaminophen **OR** acetaminophen, albuterol, diltiazem, iopamidol, sodium chloride flush  Micro Results Recent Results (from the past 240 hour(s))  Culture, blood (Routine x 2)     Status: None   Collection Time: 09/08/16  1:06 PM  Result Value Ref Range Status   Specimen Description BLOOD LEFT HAND  Final   Special Requests BOTTLES DRAWN AEROBIC AND ANAEROBIC 5CC  Final   Culture   Final    NO GROWTH 5 DAYS Performed at [facility]    Report Status 09/13/2016 FINAL  Final  Culture, blood (Routine x 2)     Status: Abnormal   Collection Time: 09/08/16  1:09 PM  Result Value Ref Range Status  Specimen Description BLOOD RIGHT ANTECUBITAL  Final   Special Requests BOTTLES DRAWN AEROBIC AND ANAEROBIC 5CC  Final   Culture  Setup Time   Final    GRAM POSITIVE COCCI IN CLUSTERS IN BOTH AEROBIC AND ANAEROBIC BOTTLES CRITICAL RESULT CALLED TO, READ BACK BY AND VERIFIED WITH: N GLOGOVAC,PHARMD AT 1253 09/09/16 BY L BENFIELD    Culture (A)  Final    STAPHYLOCOCCUS SPECIES (COAGULASE NEGATIVE) THE SIGNIFICANCE OF ISOLATING THIS ORGANISM FROM A SINGLE SET OF BLOOD CULTURES WHEN MULTIPLE SETS ARE DRAWN IS UNCERTAIN. PLEASE NOTIFY THE MICROBIOLOGY DEPARTMENT WITHIN ONE WEEK IF SPECIATION AND SENSITIVITIES ARE REQUIRED. Performed at [facility]    Report Status 09/11/2016 FINAL  Final  Blood Culture ID Panel (Reflexed)     Status: Abnormal   Collection Time: 09/08/16  1:09 PM  Result Value Ref Range Status   Enterococcus species NOT DETECTED NOT DETECTED Final   Listeria monocytogenes NOT DETECTED NOT DETECTED Final   Staphylococcus species DETECTED (A) NOT DETECTED Final    Comment: CRITICAL RESULT CALLED TO, READ BACK BY AND VERIFIED WITH: N GLOGOVAC,PHARMD AT 1253 09/09/16 BY L BENFIELD    Staphylococcus aureus NOT DETECTED  NOT DETECTED Final   Methicillin resistance NOT DETECTED NOT DETECTED Final   Streptococcus species NOT DETECTED NOT DETECTED Final   Streptococcus agalactiae NOT DETECTED NOT DETECTED Final   Streptococcus pneumoniae NOT DETECTED NOT DETECTED Final   Streptococcus pyogenes NOT DETECTED NOT DETECTED Final   Acinetobacter baumannii NOT DETECTED NOT DETECTED Final   Enterobacteriaceae species NOT DETECTED NOT DETECTED Final   Enterobacter cloacae complex NOT DETECTED NOT DETECTED Final   Escherichia coli NOT DETECTED NOT DETECTED Final   Klebsiella oxytoca NOT DETECTED NOT DETECTED Final   Klebsiella pneumoniae NOT DETECTED NOT DETECTED Final   Proteus species NOT DETECTED NOT DETECTED Final   Serratia marcescens NOT DETECTED NOT DETECTED Final   Haemophilus influenzae NOT DETECTED NOT DETECTED Final   Neisseria meningitidis NOT DETECTED NOT DETECTED Final   Pseudomonas aeruginosa NOT DETECTED NOT DETECTED Final   Candida albicans NOT DETECTED NOT DETECTED Final   Candida glabrata NOT DETECTED NOT DETECTED Final   Candida krusei NOT DETECTED NOT DETECTED Final   Candida parapsilosis NOT DETECTED NOT DETECTED Final   Candida tropicalis NOT DETECTED NOT DETECTED Final    Comment: Performed at [facility]  Urine culture     Status: None   Collection Time: 09/08/16  3:00 PM  Result Value Ref Range Status   Specimen Description URINE, CATHETERIZED  Final   Special Requests NONE  Final   Culture NO GROWTH Performed at [facility]   Final   Report Status 09/09/2016 FINAL  Final  MRSA PCR Screening     Status: None   Collection Time: 09/08/16 10:21 PM  Result Value Ref Range Status   MRSA by PCR NEGATIVE NEGATIVE Final    Comment:        The GeneXpert MRSA Assay (FDA approved for NASAL specimens only), is one component of a comprehensive MRSA colonization surveillance program. It is not intended to diagnose MRSA infection nor to guide or monitor treatment for MRSA  infections.   Respiratory Panel by PCR     Status: None   Collection Time: 09/09/16  9:43 AM  Result Value Ref Range Status   Adenovirus NOT DETECTED NOT DETECTED Final   Coronavirus 229E NOT DETECTED NOT DETECTED Final   Coronavirus HKU1 NOT DETECTED NOT DETECTED Final  Coronavirus NL63 NOT DETECTED NOT DETECTED Final   Coronavirus OC43 NOT DETECTED NOT DETECTED Final   Metapneumovirus NOT DETECTED NOT DETECTED Final   Rhinovirus / Enterovirus NOT DETECTED NOT DETECTED Final   Influenza A NOT DETECTED NOT DETECTED Final   Influenza B NOT DETECTED NOT DETECTED Final   Parainfluenza Virus 1 NOT DETECTED NOT DETECTED Final   Parainfluenza Virus 2 NOT DETECTED NOT DETECTED Final   Parainfluenza Virus 3 NOT DETECTED NOT DETECTED Final   Parainfluenza Virus 4 NOT DETECTED NOT DETECTED Final   Respiratory Syncytial Virus NOT DETECTED NOT DETECTED Final   Bordetella pertussis NOT DETECTED NOT DETECTED Final   Chlamydophila pneumoniae NOT DETECTED NOT DETECTED Final   Mycoplasma pneumoniae NOT DETECTED NOT DETECTED Final    Comment: Performed at [facility]  Culture, blood (routine x 2)     Status: Abnormal   Collection Time: 09/10/16 10:29 AM  Result Value Ref Range Status   Specimen Description BLOOD RIGHT HAND  Final   Special Requests IN PEDIATRIC BOTTLE 4CC  Final   Culture  Setup Time   Final    GRAM POSITIVE COCCI IN CLUSTERS IN PEDIATRIC BOTTLE CRITICAL RESULT CALLED TO, READ BACK BY AND VERIFIED WITH: Chales Abrahams, [place] AT [place] ON [id number] BY Rhea Bleacher    Culture (A)  Final    STAPHYLOCOCCUS SPECIES (COAGULASE NEGATIVE) SUSCEPTIBILITIES REQUESTED BY PHARMACY Performed at [facility]    Report Status 09/14/2016 FINAL  Final   Organism ID, Bacteria STAPHYLOCOCCUS SPECIES (COAGULASE NEGATIVE)  Final      Susceptibility   Staphylococcus species (coagulase negative) - MIC*    CIPROFLOXACIN >=8 RESISTANT Resistant     ERYTHROMYCIN >=8 RESISTANT Resistant      GENTAMICIN <=0.5 SENSITIVE Sensitive     OXACILLIN 0.5 RESISTANT Resistant     TETRACYCLINE <=1 SENSITIVE Sensitive     VANCOMYCIN 1 SENSITIVE Sensitive     TRIMETH/SULFA <=10 SENSITIVE Sensitive     CLINDAMYCIN <=0.25 RESISTANT Resistant     RIFAMPIN <=0.5 SENSITIVE Sensitive     Inducible Clindamycin POSITIVE Resistant     * STAPHYLOCOCCUS SPECIES (COAGULASE NEGATIVE)  Culture, blood (routine x 2)     Status: None   Collection Time: 09/10/16 10:37 AM  Result Value Ref Range Status   Specimen Description BLOOD LEFT HAND  Final   Special Requests BOTTLES DRAWN AEROBIC ONLY 5CC  Final   Culture   Final    NO GROWTH 5 DAYS Performed at [facility]    Report Status 09/15/2016 FINAL  Final  Respiratory Panel by PCR     Status: None   Collection Time: 09/13/16  8:14 AM  Result Value Ref Range Status   Adenovirus NOT DETECTED NOT DETECTED Final   Coronavirus 229E NOT DETECTED NOT DETECTED Final   Coronavirus HKU1 NOT DETECTED NOT DETECTED Final   Coronavirus NL63 NOT DETECTED NOT DETECTED Final   Coronavirus OC43 NOT DETECTED NOT DETECTED Final   Metapneumovirus NOT DETECTED NOT DETECTED Final   Rhinovirus / Enterovirus NOT DETECTED NOT DETECTED Final   Influenza A NOT DETECTED NOT DETECTED Final   Influenza B NOT DETECTED NOT DETECTED Final   Parainfluenza Virus 1 NOT DETECTED NOT DETECTED Final   Parainfluenza Virus 2 NOT DETECTED NOT DETECTED Final   Parainfluenza Virus 3 NOT DETECTED NOT DETECTED Final   Parainfluenza Virus 4 NOT DETECTED NOT DETECTED Final   Respiratory Syncytial Virus NOT DETECTED NOT DETECTED Final   Bordetella pertussis NOT  DETECTED NOT DETECTED Final   Chlamydophila pneumoniae NOT DETECTED NOT DETECTED Final   Mycoplasma pneumoniae NOT DETECTED NOT DETECTED Final    Comment: Performed at [facility]    Radiology Reports Ct Head Wo Contrast  Result Date: 09/16/2016 CLINICAL DATA:  70 year old female with history of fever and sepsis.  EXAM: CT HEAD WITHOUT CONTRAST TECHNIQUE: Contiguous axial images were obtained from the base of the skull through the vertex without intravenous contrast. COMPARISON:  Head CT 08/03/2014. FINDINGS: Brain: No evidence of acute infarction, hemorrhage, hydrocephalus, extra-axial collection or mass lesion/mass effect. Mild cerebral and cerebellar atrophy. More focal area of volume loss in the left parieto-occipital region with associated enlargement of the atrium of the left lateral ventricle, potentially related to perinatal leukomalacia or encephalomalacia from prior infarction. Physiologic calcification of the basal ganglia bilaterally (left greater than right). Vascular: No hyperdense vessel or unexpected calcification. Skull: Hyperostosis. No acute displaced fracture. Large left mastoid effusion. Sinuses/Orbits: No acute finding. Other: None. IMPRESSION: 1. No acute intracranial abnormalities. 2. Large left mastoid effusion. Clinical correlation for signs and symptoms of mastoiditis is recommended. 3. Mild cerebral and cerebellar atrophy. 4. More focal atrophy in the left parieto-occipital region with associated dilatation of the atrium of the left lateral ventricle, similar to prior studies, potentially related to perinatal leukomalacia or prior infarct. Electronically Signed   By: Vinnie Langton M.D.   On: 09/16/2016 10:29   Ct Chest W Contrast  Result Date: 09/11/2016 CLINICAL DATA:  Sepsis EXAM: CT CHEST, ABDOMEN, AND PELVIS WITH CONTRAST TECHNIQUE: Multidetector CT imaging of the chest, abdomen and pelvis was performed following the standard protocol during bolus administration of intravenous contrast. CONTRAST:  158mL ISOVUE-300 IOPAMIDOL (ISOVUE-300) INJECTION 61% COMPARISON:  06/01/2014 FINDINGS: CT CHEST FINDINGS Cardiovascular: There is no evidence of aortic aneurysm, dissection, or intramural hematoma. Mild coronary artery calcifications in the LAD territory. Mitral annular calcification.  Mediastinum/Nodes: No evidence of abnormal mediastinal adenopathy no pericardial effusion. No abnormal axillary adenopathy. Lungs/Pleura: Small bilateral pleural effusions. No pneumothorax. There is dependent atelectasis bilaterally. Hazy ground-glass throughout the remainder of the lungs likely reflects hypoaeration. Musculoskeletal: There is no vertebral compression deformity in the thoracic spine. CT ABDOMEN PELVIS FINDINGS Hepatobiliary: Diffuse hepatic steatosis.  Postcholecystectomy. Pancreas: There is stranding surrounding the head and body of the pancreas. There is no evidence of pancreatic necrosis or hemorrhage. There is no obvious focal mass in the pancreas. Heterogeneity may simply reflect streak artifact. Spleen: Unremarkable. Adrenals/Urinary Tract: Left kidney remains severely atrophic. Adrenal glands are unremarkable. There is hypertrophy of the right kidney. Bladder is within normal limits. Stomach/Bowel: Gastrostomy tube is in place. There is a foreign body in the stomach that is unchanged compare with the prior study the colon is decompressed. The appendix is within normal limits and contains contrast. There is no obvious mass in the colon. No evidence of small-bowel obstruction. Vascular/Lymphatic: There is no evidence of aortic aneurysm. Mild atherosclerotic calcifications of the aorta and iliac vasculature are noted. No abnormal retroperitoneal adenopathy. Small para-aortic and mesenteric nodes. Reproductive: Uterus contains a single calcification likely related to an old fibroid. No obvious ovarian mass. Other: There is a small amount of free fluid in the upper pelvis and right lower quadrant of the abdomen associated with distal small bowel loops and the uterus. There is also stranding within the mesenteric fat as well as along the course of the ascending and descending colon. There is stranding of the subcutaneous fat as well. These findings may all simply  represent volume overload state.  Inflammatory process is not excluded. Musculoskeletal: No vertebral compression deformity. IMPRESSION: Small bilateral pleural effusions and dependent atelectasis. Small amount of free fluid in the lower abdomen and pelvis. This is nonspecific and may be related to volume overload. An inflammatory process is not excluded. Foreign body remains in the stomach Create Gastrostomy tube is in place. There is stranding about the pancreas which may be related to volume overload. A focal inflammatory process is not excluded. Electronically Signed   By: Marybelle Killings M.D.   On: 09/11/2016 12:41   Ct Angio Chest Pe W Or Wo Contrast  Result Date: 09/16/2016 CLINICAL DATA:  70 year old female with history of sepsis and fever. EXAM: CT ANGIOGRAPHY CHEST WITH CONTRAST TECHNIQUE: Multidetector CT imaging of the chest was performed using the standard protocol during bolus administration of intravenous contrast. Multiplanar CT image reconstructions and MIPs were obtained to evaluate the vascular anatomy. CONTRAST:  100 mL of Isovue 370. COMPARISON:  Multiple priors, most recently chest CT 09/11/2016. FINDINGS: Comment: Today's study is limited by considerable patient respiratory motion such that accurate assessment for distal segmental and subsegmental sized emboli is not possible on today's examination. Cardiovascular: Respiratory motion limits assessment of pulmonary vasculature. With these limitations in mind, there is no central, lobar or proximal segmental sized filling defect to suggest pulmonary embolism. More distal segmental and subsegmental sized emboli cannot be entirely excluded. Heart size is normal. There is no significant pericardial fluid, thickening or pericardial calcification. There is aortic atherosclerosis, as well as atherosclerosis of the great vessels of the mediastinum and the coronary arteries, including calcified atherosclerotic plaque in the left main and left circumflex coronary arteries.  Calcifications and thickening of the aortic valve. Calcifications of the mitral annulus. Mediastinum/Nodes: No pathologically enlarged mediastinal or hilar lymph nodes. Esophagus is unremarkable in appearance. No axillary lymphadenopathy. Lungs/Pleura: Trace left pleural effusion lying dependently. Low lung volumes. Patchy areas of very mild ground-glass attenuation and septal thickening noted throughout the lungs bilaterally, favored to largely be artifactual from low lung volumes, although some developing multifocal airspace disease is not excluded. No definite suspicious appearing pulmonary nodules or masses are noted on today's motion limited examination. Upper Abdomen: Percutaneous gastrostomy tube with tip in the distal body of the stomach. Unusual nearly tubular appearing foreign body is again noted in the stomach measuring approximately 3.1 x 8.9 x 3.3 cm. In retrospect, this foreign body has been present compared to prior CT dating back to [id number] and is of uncertain etiology. Diffuse low-attenuation in the liver suggestive of underlying hepatic steatosis (difficult to say for certain on today's contrast enhanced CT examination. Musculoskeletal: There are no aggressive appearing lytic or blastic lesions noted in the visualized portions of the skeleton. Review of the MIP images confirms the above findings. IMPRESSION: 1. Study is limited secondary to patient respiratory motion. With these limitations in mind there is no central, lobar or proximal segmental sized embolus. Smaller distal segmental and subsegmental sized emboli cannot be entirely excluded. 2. Trace left pleural effusion lying dependently. 3. Low lung volumes with some patchy ill-defined areas of ground-glass attenuation and septal thickening which is largely favored to be artifactual, although the possibility of developing multifocal airspace disease from atypical infection is not excluded. Attention on follow-up chest x-rays is recommended.  4. Aortic atherosclerosis, in addition to left main and left circumflex coronary artery disease. Assessment for potential risk factor modification, dietary therapy or pharmacologic therapy may be warranted, if clinically indicated. 5. There are  calcifications of the aortic valve and mitral annulus. Echocardiographic correlation for evaluation of potential valvular dysfunction may be warranted if clinically indicated. 6. Persistent highly unusual appearing foreign body in the stomach, similar to remote prior studies dating back to 12/29/2010. This remains of uncertain etiology and significance. Electronically Signed   By: Vinnie Langton M.D.   On: 09/16/2016 10:58   Ct Abdomen Pelvis W Contrast  Result Date: 09/11/2016 CLINICAL DATA:  Sepsis EXAM: CT CHEST, ABDOMEN, AND PELVIS WITH CONTRAST TECHNIQUE: Multidetector CT imaging of the chest, abdomen and pelvis was performed following the standard protocol during bolus administration of intravenous contrast. CONTRAST:  130mL ISOVUE-300 IOPAMIDOL (ISOVUE-300) INJECTION 61% COMPARISON:  06/01/2014 FINDINGS: CT CHEST FINDINGS Cardiovascular: There is no evidence of aortic aneurysm, dissection, or intramural hematoma. Mild coronary artery calcifications in the LAD territory. Mitral annular calcification. Mediastinum/Nodes: No evidence of abnormal mediastinal adenopathy no pericardial effusion. No abnormal axillary adenopathy. Lungs/Pleura: Small bilateral pleural effusions. No pneumothorax. There is dependent atelectasis bilaterally. Hazy ground-glass throughout the remainder of the lungs likely reflects hypoaeration. Musculoskeletal: There is no vertebral compression deformity in the thoracic spine. CT ABDOMEN PELVIS FINDINGS Hepatobiliary: Diffuse hepatic steatosis.  Postcholecystectomy. Pancreas: There is stranding surrounding the head and body of the pancreas. There is no evidence of pancreatic necrosis or hemorrhage. There is no obvious focal mass in the  pancreas. Heterogeneity may simply reflect streak artifact. Spleen: Unremarkable. Adrenals/Urinary Tract: Left kidney remains severely atrophic. Adrenal glands are unremarkable. There is hypertrophy of the right kidney. Bladder is within normal limits. Stomach/Bowel: Gastrostomy tube is in place. There is a foreign body in the stomach that is unchanged compare with the prior study the colon is decompressed. The appendix is within normal limits and contains contrast. There is no obvious mass in the colon. No evidence of small-bowel obstruction. Vascular/Lymphatic: There is no evidence of aortic aneurysm. Mild atherosclerotic calcifications of the aorta and iliac vasculature are noted. No abnormal retroperitoneal adenopathy. Small para-aortic and mesenteric nodes. Reproductive: Uterus contains a single calcification likely related to an old fibroid. No obvious ovarian mass. Other: There is a small amount of free fluid in the upper pelvis and right lower quadrant of the abdomen associated with distal small bowel loops and the uterus. There is also stranding within the mesenteric fat as well as along the course of the ascending and descending colon. There is stranding of the subcutaneous fat as well. These findings may all simply represent volume overload state. Inflammatory process is not excluded. Musculoskeletal: No vertebral compression deformity. IMPRESSION: Small bilateral pleural effusions and dependent atelectasis. Small amount of free fluid in the lower abdomen and pelvis. This is nonspecific and may be related to volume overload. An inflammatory process is not excluded. Foreign body remains in the stomach Create Gastrostomy tube is in place. There is stranding about the pancreas which may be related to volume overload. A focal inflammatory process is not excluded. Electronically Signed   By: Marybelle Killings M.D.   On: 09/11/2016 12:41   Dg Chest Port 1 View  Result Date: 09/12/2016 CLINICAL DATA:  Shortness  of Breath EXAM: PORTABLE CHEST 1 VIEW COMPARISON:  09/11/2016 FINDINGS: Cardiac shadow is mildly enlarged but stable. The overall inspiratory effort is poor with crowding of the vascular markings. Bibasilar atelectatic changes are seen. The known pleural effusions are not as well appreciated as on the prior CT examination. No bony abnormality is seen. IMPRESSION: Stable bibasilar atelectasis. Overall poor inspiratory effort with crowding of the vascular markings.  The known pleural effusions are not well appreciated. Electronically Signed   By: Inez Catalina M.D.   On: 09/12/2016 14:17   Dg Chest Portable 1 View  Result Date: 09/08/2016 CLINICAL DATA:  70 year old female with a history of sepsis EXAM: PORTABLE CHEST 1 VIEW COMPARISON:  06/03/2016, 02/08/2016, 11/11/2014 FINDINGS: Significant right rotation somewhat limits evaluation. Low lung volumes accentuating the interstitium. Interstitial opacities bilaterally. No pneumothorax identified.  No large pleural effusion. No large confluent airspace disease. IMPRESSION: Limited exam given the positioning with low lung volumes and likely atelectasis. No definite lobar pneumonia. Signed, Dulcy Fanny. Earleen Newport, DO [facility] Electronically Signed   By: Corrie Mckusick D.O.   On: 09/08/2016 13:31   US Abdomen Limited Ruq  Result Date: 09/09/2016 CLINICAL DATA:  Sepsis.  Prior cholecystectomy. EXAM: US ABDOMEN LIMITED - RIGHT UPPER QUADRANT COMPARISON:  None. FINDINGS: Limited exam due to body habitus. Gallbladder: Surgically absent. Common bile duct: Diameter: 2.3 mm Liver: Diffusely increased in echogenicity. IMPRESSION: Surgically absent gallbladder.  No biliary ductal dilatation. Hepatic steatosis. Electronically Signed   By: Lovey Newcomer M.D.   On: 09/09/2016 10:44    Time Spent in minutes  35   Louellen Molder M.D on 09/16/2016 at 1:22 PM  Between 7am to 7pm - Pager - [phone]  After 7pm go to  [email or web address] - password [facility]  [facility] -  Office  [phone]

## 2016-09-16 NOTE — Progress Notes (Signed)
Pharmacy Antibiotic Note  Dawn [name] is a 70 y.o. female admitted on 09/08/2016 with bacteremia.   Pharmacy has been consulted for Vancomycin dosing.  Plan: Vancomycin 750mg  IV q12h Follow renal function vanc trough at steady state  Height: 5' (152.4 cm) Weight: 149 lb 4 oz (67.7 kg) IBW/kg (Calculated) : 45.5  Temp (24hrs), Avg:102.5 F (39.2 C), Min:99.1 F (37.3 C), Max:103.8 F (39.9 C)   Recent Labs Lab 09/10/16 0340  09/11/16 0335 09/11/16 0804 09/11/16 0934 09/12/16 0515 09/12/16 0828 09/12/16 1111 09/12/16 1353 09/13/16 0514 09/14/16 0426 09/14/16 0920 09/14/16 1314  WBC 23.7*  --  16.6*  --   --  16.2*  --  14.1*  --  15.7* 16.8*  --   --   CREATININE 0.68  --  0.55  --   --   --   --   --  0.55  --  0.70  --   --   LATICACIDVEN  --   < >  --  3.9*  --   --  5.5* 5.2*  --   --   --  6.3* 6.7*  VANCOTROUGH  --   --   --   --  11*  --   --   --   --   --   --   --   --   < > = values in this interval not displayed.  Estimated Creatinine Clearance: 56.2 mL/min (by C-G formula based on SCr of 0.7 mg/dL).    Allergies  Allergen Reactions  . Carvedilol   . Ppd [Tuberculin Purified Protein Derivative]     Per MAR    Antimicrobials this admission: 11/25 Vancomycin >> 12/1, resumed 12/3 >> 11/25 Zosyn>> 12/1 12/1 LVQ >> 12/3 Dose adjustments this admission: 11/28 VT at 0900 = 11 ( 500 mg q12h) --> increase to 750 mg q12h  Microbiology results: 11/25 BCx x2: 1/2 Coagulase-negative staph (BCID + staph species, no resistance) 11/25 UCx: neg FINAL 11/26 Resp Panel PCR: neg 11/25 MRSA PCR: negative 11/27 bcx x2: 1/2 GPC in clusters - MR-CoNS 11/30 resp virus panel: neg  Thank you for allowing pharmacy to be a part of this patient's care.  Dolly Rias RPh 09/16/2016, 1:28 PM Pager [phone]

## 2016-09-16 NOTE — Progress Notes (Addendum)
Notified on call practitoners about abnormal HR (120s)  and Temp 103.6 rectally. Made on call aware tylenol and Cardizem PRN has been given with minimal to no change. Will reassess HR and Temp and notify of the values.  Will cont to monitor.

## 2016-09-16 NOTE — Progress Notes (Signed)
Temp 101, tylenol given and Dr. Clementeen Graham aware. Will continue to assess

## 2016-09-16 NOTE — Progress Notes (Signed)
*  Preliminary Results* Bilateral lower extremity venous duplex completed. Study was very technically difficult due to patient's contracted state. There is no obvious evidence of deep vein thrombosis bilaterally. There is no evidence of Baker's cyst bilaterally.  09/16/2016 9:39 AM Maudry Mayhew, BS, RVT, RDCS, RDMS

## 2016-09-16 NOTE — Progress Notes (Signed)
Made On Call P.A.-Sofia aware of values- Hr 122 and temp decreased to 103.1. Pt BP is 92/54 ordered given to hold 0600 dose of  80 mg of ivp lasix for now. Advised  rounding day shift  MD will readdress lasix orders and temp. Will make day shift nurse aware of concerns.

## 2016-09-16 NOTE — Consult Note (Addendum)
[facility] for Infectious Disease  Date of Admission:  09/08/2016  Date of Consult:  09/16/2016  Reason for Consult: Fever Referring Physician: Dhungel  Impression/Recommendation Fever  Mastoid effusion on head CT  Potential Hospice @ D/C  Would Stop levaquin Resume vanco Repeat her BCx Pursue hospice eval  Comment- The presumption previously was that the MRSE was a true pathogen. She is not on treatment for that now and her fevers have returned. Aside from levquin allergy, this would seem to be the clearest explanation. There is no listed hx of device implantation.   Thank you so much for this interesting consult,   Dr Linus Salmons will f/u 12-4  [name] (pager) [phone] [email or web address]  Dawn [name] is an 70 y.o. female.  HPI: 70 yo F with hx dementia, seizure d/o, adm on 11-25 with fever from her SNF. She was not able to provide hx.  In ED temp was 103.4, WBC 27.8, elevated AST and Alk P (normal ALT). Her lactate was 8.3. Her CXR did not show definite infiltrate. She was started on vanco/zosyn. Her adm BCx grew 1/2 MRSE and were again MRSE 11-27. She had RUQ u/s (-), no gall bladder.  She was eval on 12-1 by CCM for persistently elevated lactic acid which was felt to be due to ongoing increased work of breathing, not due to sepsis. She was also noted to have episodes of fluid overload. They also advised to check no further lactic acids due to her overall poor prognosis.  She was then changed to po levaquin for an additional 10 days of rx.  She began to have fever again over the last 24h, up to 103.6.  Her WBC has improve to 14.1 until rising to 16. 8 today.   TTE no vegitation  LE dopplers no DVT (12-3).   Past Medical History:  Diagnosis Date  . Anemia   . Anginal pain ([place])   . Cataract   . Cataracts, bilateral   . CHF (congestive heart failure) ([place])   . Coronary artery disease   . Dementia   . Dental caries   . Dysphagia   .  Esophageal reflux   . High cholesterol   . Hyperlipidemia   . Hypotension   . Hypothyroidism   . Optic atrophy   . Optic atrophy   . Osteoporosis   . 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 Paroxysmal atrial fibrillation ([facility])   . Seizures ([place])   . Sepsis ([place]) 01/2016  . Thyroid disease    hypothyroid  . Unspecified convulsions ([place])     Past Surgical History:  Procedure Laterality Date  . CENTRAL VENOUS CATHETER INSERTION  05/15/2014      . ESOPHAGOGASTRODUODENOSCOPY N/A 06/03/2014   Procedure: ESOPHAGOGASTRODUODENOSCOPY (EGD);  Surgeon: Beryle Beams, MD;  Location: [facility] ENDOSCOPY;  Service: Endoscopy;  Laterality: N/A;  . GASTROSTOMY TUBE PLACEMENT    . midline incision       Allergies  Allergen Reactions  . Carvedilol   . Ppd [Tuberculin Purified Protein Derivative]     Per MAR    Medications:  Scheduled: . chlorhexidine  15 mL Mouth Rinse BID  . clonazePAM  1 mg Per Tube Q8H  . docusate  200 mg Per Tube BID  . feeding supplement (PRO-STAT SUGAR FREE 64)  30 mL Per Tube Daily  . free water  100 mL Per Tube Q4H  . furosemide  80 mg Intravenous Q12H  . iopamidol      .  levETIRAcetam  500 mg Per Tube Q0600  . levothyroxine  100 mcg Oral QAC breakfast  . mouth rinse  15 mL Mouth Rinse q12n4p  . potassium chloride  40 mEq Oral Daily  . ranitidine  150 mg Per Tube QHS  . sodium chloride      . sodium chloride flush  3 mL Intravenous Q12H  . TWOCAL HN  1,000 mL Per Tube Q24H  . Valproate Sodium  750 mg Per Tube TID    Abtx:  Anti-infectives    Start     Dose/Rate Route Frequency Ordered Stop   09/14/16 2200  levofloxacin (LEVAQUIN) tablet 750 mg  Status:  Discontinued     750 mg Oral Daily 09/14/16 1532 09/16/16 0759   09/11/16 1100  vancomycin (VANCOCIN) IVPB 750 mg/150 ml premix  Status:  Discontinued     750 mg 150 mL/hr over 60 Minutes Intravenous Every 12 hours 09/11/16 1035 09/14/16 1530   09/09/16 2100  vancomycin (VANCOCIN) 500 mg in sodium chloride 0.9 % 100 mL  IVPB  Status:  Discontinued     500 mg 100 mL/hr over 60 Minutes Intravenous Every 12 hours 09/09/16 2050 09/11/16 1035   09/09/16 0200  vancomycin (VANCOCIN) 500 mg in sodium chloride 0.9 % 100 mL IVPB  Status:  Discontinued     500 mg 100 mL/hr over 60 Minutes Intravenous Every 12 hours 09/08/16 1437 09/09/16 1123   09/08/16 2000  piperacillin-tazobactam (ZOSYN) IVPB 3.375 g  Status:  Discontinued     3.375 g 12.5 mL/hr over 240 Minutes Intravenous Every 8 hours 09/08/16 1437 09/14/16 1530   09/08/16 1345  piperacillin-tazobactam (ZOSYN) IVPB 3.375 g     3.375 g 100 mL/hr over 30 Minutes Intravenous  Once 09/08/16 1340 09/08/16 1415   09/08/16 1345  vancomycin (VANCOCIN) IVPB 1000 mg/200 mL premix     1,000 mg 200 mL/hr over 60 Minutes Intravenous STAT 09/08/16 1340 09/08/16 1453      Total days of antibiotics: 8          Social History:  reports that she has never smoked. She has never used smokeless tobacco. She reports that she does not drink alcohol or use drugs.  Family History  Problem Relation Age of Onset  . Colon cancer Mother   . Diabetes type II Sister     General ROS: pt non-verbal. per nursing no cough, has soft but not watery BM. see HPI  Blood pressure (!) 92/54, pulse (!) 111, temperature (!) 101.5 F (38.6 C), temperature source Rectal, resp. rate (!) 22, height 5' (1.524 m), weight 67.7 kg (149 lb 4 oz), SpO2 99 %. General appearance: no distress Eyes: she will not cooperate and allow eye exam.  Throat: crusting on her lips. she does not open her mouth.  Neck: no adenopathy, supple, symmetrical, trachea midline and FROM Lungs: clear to auscultation bilaterally Heart: tachycardia Abdomen: normal findings: bowel sounds normal and soft, non-tender and abnormal findings:  GTube site is clean, no d/c. non-tender.  Extremities: contractures, scaling skin on feet. peripheral IVs   Results for orders placed or performed during the hospital encounter of 09/08/16  (from the past 48 hour(s))  Lactic acid, plasma     Status: Abnormal   Collection Time: 09/14/16  1:14 PM  Result Value Ref Range   Lactic Acid, Venous 6.7 (HH) 0.5 - 1.9 mmol/L    Comment: CRITICAL RESULT CALLED TO, READ BACK BY AND VERIFIED WITH: STEVENS,G. RN AT 1400 09/14/16 MULLINS,T  Lipase, blood     Status: None   Collection Time: 09/14/16  1:14 PM  Result Value Ref Range   Lipase 48 11 - 51 U/L  Amylase     Status: None   Collection Time: 09/14/16  1:14 PM  Result Value Ref Range   Amylase 44 28 - 100 U/L  Lactate dehydrogenase     Status: Abnormal   Collection Time: 09/14/16  1:14 PM  Result Value Ref Range   LDH 207 (H) 98 - 192 U/L      Component Value Date/Time   SDES BLOOD LEFT HAND 09/10/2016 1037   SPECREQUEST BOTTLES DRAWN AEROBIC ONLY 5CC 09/10/2016 1037   CULT  09/10/2016 1037    NO GROWTH 5 DAYS Performed at [facility] 09/15/2016 FINAL 09/10/2016 1037   Ct Head Wo Contrast  Result Date: 09/16/2016 CLINICAL DATA:  70 year old female with history of fever and sepsis. EXAM: CT HEAD WITHOUT CONTRAST TECHNIQUE: Contiguous axial images were obtained from the base of the skull through the vertex without intravenous contrast. COMPARISON:  Head CT 08/03/2014. FINDINGS: Brain: No evidence of acute infarction, hemorrhage, hydrocephalus, extra-axial collection or mass lesion/mass effect. Mild cerebral and cerebellar atrophy. More focal area of volume loss in the left parieto-occipital region with associated enlargement of the atrium of the left lateral ventricle, potentially related to perinatal leukomalacia or encephalomalacia from prior infarction. Physiologic calcification of the basal ganglia bilaterally (left greater than right). Vascular: No hyperdense vessel or unexpected calcification. Skull: Hyperostosis. No acute displaced fracture. Large left mastoid effusion. Sinuses/Orbits: No acute finding. Other: None. IMPRESSION: 1. No acute  intracranial abnormalities. 2. Large left mastoid effusion. Clinical correlation for signs and symptoms of mastoiditis is recommended. 3. Mild cerebral and cerebellar atrophy. 4. More focal atrophy in the left parieto-occipital region with associated dilatation of the atrium of the left lateral ventricle, similar to prior studies, potentially related to perinatal leukomalacia or prior infarct. Electronically Signed   By: Vinnie Langton M.D.   On: 09/16/2016 10:29   Ct Angio Chest Pe W Or Wo Contrast  Result Date: 09/16/2016 CLINICAL DATA:  70 year old female with history of sepsis and fever. EXAM: CT ANGIOGRAPHY CHEST WITH CONTRAST TECHNIQUE: Multidetector CT imaging of the chest was performed using the standard protocol during bolus administration of intravenous contrast. Multiplanar CT image reconstructions and MIPs were obtained to evaluate the vascular anatomy. CONTRAST:  100 mL of Isovue 370. COMPARISON:  Multiple priors, most recently chest CT 09/11/2016. FINDINGS: Comment: Today's study is limited by considerable patient respiratory motion such that accurate assessment for distal segmental and subsegmental sized emboli is not possible on today's examination. Cardiovascular: Respiratory motion limits assessment of pulmonary vasculature. With these limitations in mind, there is no central, lobar or proximal segmental sized filling defect to suggest pulmonary embolism. More distal segmental and subsegmental sized emboli cannot be entirely excluded. Heart size is normal. There is no significant pericardial fluid, thickening or pericardial calcification. There is aortic atherosclerosis, as well as atherosclerosis of the great vessels of the mediastinum and the coronary arteries, including calcified atherosclerotic plaque in the left main and left circumflex coronary arteries. Calcifications and thickening of the aortic valve. Calcifications of the mitral annulus. Mediastinum/Nodes: No pathologically enlarged  mediastinal or hilar lymph nodes. Esophagus is unremarkable in appearance. No axillary lymphadenopathy. Lungs/Pleura: Trace left pleural effusion lying dependently. Low lung volumes. Patchy areas of very mild ground-glass attenuation and septal thickening noted throughout the lungs bilaterally, favored to largely  be artifactual from low lung volumes, although some developing multifocal airspace disease is not excluded. No definite suspicious appearing pulmonary nodules or masses are noted on today's motion limited examination. Upper Abdomen: Percutaneous gastrostomy tube with tip in the distal body of the stomach. Unusual nearly tubular appearing foreign body is again noted in the stomach measuring approximately 3.1 x 8.9 x 3.3 cm. In retrospect, this foreign body has been present compared to prior CT dating back to 25/63/8937 and is of uncertain etiology. Diffuse low-attenuation in the liver suggestive of underlying hepatic steatosis (difficult to say for certain on today's contrast enhanced CT examination. Musculoskeletal: There are no aggressive appearing lytic or blastic lesions noted in the visualized portions of the skeleton. Review of the MIP images confirms the above findings. IMPRESSION: 1. Study is limited secondary to patient respiratory motion. With these limitations in mind there is no central, lobar or proximal segmental sized embolus. Smaller distal segmental and subsegmental sized emboli cannot be entirely excluded. 2. Trace left pleural effusion lying dependently. 3. Low lung volumes with some patchy ill-defined areas of ground-glass attenuation and septal thickening which is largely favored to be artifactual, although the possibility of developing multifocal airspace disease from atypical infection is not excluded. Attention on follow-up chest x-rays is recommended. 4. Aortic atherosclerosis, in addition to left main and left circumflex coronary artery disease. Assessment for potential risk factor  modification, dietary therapy or pharmacologic therapy may be warranted, if clinically indicated. 5. There are calcifications of the aortic valve and mitral annulus. Echocardiographic correlation for evaluation of potential valvular dysfunction may be warranted if clinically indicated. 6. Persistent highly unusual appearing foreign body in the stomach, similar to remote prior studies dating back to 12/29/2010. This remains of uncertain etiology and significance. Electronically Signed   By: Vinnie Langton M.D.   On: 09/16/2016 10:58   Recent Results (from the past 240 hour(s))  Culture, blood (Routine x 2)     Status: None   Collection Time: 09/08/16  1:06 PM  Result Value Ref Range Status   Specimen Description BLOOD LEFT HAND  Final   Special Requests BOTTLES DRAWN AEROBIC AND ANAEROBIC 5CC  Final   Culture   Final    NO GROWTH 5 DAYS Performed at [facility]    Report Status 09/13/2016 FINAL  Final  Culture, blood (Routine x 2)     Status: Abnormal   Collection Time: 09/08/16  1:09 PM  Result Value Ref Range Status   Specimen Description BLOOD RIGHT ANTECUBITAL  Final   Special Requests BOTTLES DRAWN AEROBIC AND ANAEROBIC 5CC  Final   Culture  Setup Time   Final    GRAM POSITIVE COCCI IN CLUSTERS IN BOTH AEROBIC AND ANAEROBIC BOTTLES CRITICAL RESULT CALLED TO, READ BACK BY AND VERIFIED WITH: N GLOGOVAC,PHARMD AT 1253 09/09/16 BY L BENFIELD    Culture (A)  Final    STAPHYLOCOCCUS SPECIES (COAGULASE NEGATIVE) THE SIGNIFICANCE OF ISOLATING THIS ORGANISM FROM A SINGLE SET OF BLOOD CULTURES WHEN MULTIPLE SETS ARE DRAWN IS UNCERTAIN. PLEASE NOTIFY THE MICROBIOLOGY DEPARTMENT WITHIN ONE WEEK IF SPECIATION AND SENSITIVITIES ARE REQUIRED. Performed at [facility]    Report Status 09/11/2016 FINAL  Final  Blood Culture ID Panel (Reflexed)     Status: Abnormal   Collection Time: 09/08/16  1:09 PM  Result Value Ref Range Status   Enterococcus species NOT DETECTED NOT DETECTED  Final   Listeria monocytogenes NOT DETECTED NOT DETECTED Final   Staphylococcus species DETECTED (A)  NOT DETECTED Final    Comment: CRITICAL RESULT CALLED TO, READ BACK BY AND VERIFIED WITH: N GLOGOVAC,PHARMD AT 1253 09/09/16 BY L BENFIELD    Staphylococcus aureus NOT DETECTED NOT DETECTED Final   Methicillin resistance NOT DETECTED NOT DETECTED Final   Streptococcus species NOT DETECTED NOT DETECTED Final   Streptococcus agalactiae NOT DETECTED NOT DETECTED Final   Streptococcus pneumoniae NOT DETECTED NOT DETECTED Final   Streptococcus pyogenes NOT DETECTED NOT DETECTED Final   Acinetobacter baumannii NOT DETECTED NOT DETECTED Final   Enterobacteriaceae species NOT DETECTED NOT DETECTED Final   Enterobacter cloacae complex NOT DETECTED NOT DETECTED Final   Escherichia coli NOT DETECTED NOT DETECTED Final   Klebsiella oxytoca NOT DETECTED NOT DETECTED Final   Klebsiella pneumoniae NOT DETECTED NOT DETECTED Final   Proteus species NOT DETECTED NOT DETECTED Final   Serratia marcescens NOT DETECTED NOT DETECTED Final   Haemophilus influenzae NOT DETECTED NOT DETECTED Final   Neisseria meningitidis NOT DETECTED NOT DETECTED Final   Pseudomonas aeruginosa NOT DETECTED NOT DETECTED Final   Candida albicans NOT DETECTED NOT DETECTED Final   Candida glabrata NOT DETECTED NOT DETECTED Final   Candida krusei NOT DETECTED NOT DETECTED Final   Candida parapsilosis NOT DETECTED NOT DETECTED Final   Candida tropicalis NOT DETECTED NOT DETECTED Final    Comment: Performed at [facility]  Urine culture     Status: None   Collection Time: 09/08/16  3:00 PM  Result Value Ref Range Status   Specimen Description URINE, CATHETERIZED  Final   Special Requests NONE  Final   Culture NO GROWTH Performed at [facility]   Final   Report Status 09/09/2016 FINAL  Final  MRSA PCR Screening     Status: None   Collection Time: 09/08/16 10:21 PM  Result Value Ref Range Status   MRSA by PCR  NEGATIVE NEGATIVE Final    Comment:        The GeneXpert MRSA Assay (FDA approved for NASAL specimens only), is one component of a comprehensive MRSA colonization surveillance program. It is not intended to diagnose MRSA infection nor to guide or monitor treatment for MRSA infections.   Respiratory Panel by PCR     Status: None   Collection Time: 09/09/16  9:43 AM  Result Value Ref Range Status   Adenovirus NOT DETECTED NOT DETECTED Final   Coronavirus 229E NOT DETECTED NOT DETECTED Final   Coronavirus HKU1 NOT DETECTED NOT DETECTED Final   Coronavirus NL63 NOT DETECTED NOT DETECTED Final   Coronavirus OC43 NOT DETECTED NOT DETECTED Final   Metapneumovirus NOT DETECTED NOT DETECTED Final   Rhinovirus / Enterovirus NOT DETECTED NOT DETECTED Final   Influenza A NOT DETECTED NOT DETECTED Final   Influenza B NOT DETECTED NOT DETECTED Final   Parainfluenza Virus 1 NOT DETECTED NOT DETECTED Final   Parainfluenza Virus 2 NOT DETECTED NOT DETECTED Final   Parainfluenza Virus 3 NOT DETECTED NOT DETECTED Final   Parainfluenza Virus 4 NOT DETECTED NOT DETECTED Final   Respiratory Syncytial Virus NOT DETECTED NOT DETECTED Final   Bordetella pertussis NOT DETECTED NOT DETECTED Final   Chlamydophila pneumoniae NOT DETECTED NOT DETECTED Final   Mycoplasma pneumoniae NOT DETECTED NOT DETECTED Final    Comment: Performed at [facility]  Culture, blood (routine x 2)     Status: Abnormal   Collection Time: 09/10/16 10:29 AM  Result Value Ref Range Status   Specimen Description BLOOD RIGHT HAND  Final  Special Requests IN PEDIATRIC BOTTLE 4CC  Final   Culture  Setup Time   Final    GRAM POSITIVE COCCI IN CLUSTERS IN PEDIATRIC BOTTLE CRITICAL RESULT CALLED TO, READ BACK BY AND VERIFIED WITH: Chales Abrahams, [place] [address] [place] [id number] BY Rhea Bleacher    Culture (A)  Final    STAPHYLOCOCCUS SPECIES (COAGULASE NEGATIVE) SUSCEPTIBILITIES REQUESTED BY PHARMACY Performed at [facility]     Report Status 09/14/2016 FINAL  Final   Organism ID, Bacteria STAPHYLOCOCCUS SPECIES (COAGULASE NEGATIVE)  Final      Susceptibility   Staphylococcus species (coagulase negative) - MIC*    CIPROFLOXACIN >=8 RESISTANT Resistant     ERYTHROMYCIN >=8 RESISTANT Resistant     GENTAMICIN <=0.5 SENSITIVE Sensitive     OXACILLIN 0.5 RESISTANT Resistant     TETRACYCLINE <=1 SENSITIVE Sensitive     VANCOMYCIN 1 SENSITIVE Sensitive     TRIMETH/SULFA <=10 SENSITIVE Sensitive     CLINDAMYCIN <=0.25 RESISTANT Resistant     RIFAMPIN <=0.5 SENSITIVE Sensitive     Inducible Clindamycin POSITIVE Resistant     * STAPHYLOCOCCUS SPECIES (COAGULASE NEGATIVE)  Culture, blood (routine x 2)     Status: None   Collection Time: 09/10/16 10:37 AM  Result Value Ref Range Status   Specimen Description BLOOD LEFT HAND  Final   Special Requests BOTTLES DRAWN AEROBIC ONLY 5CC  Final   Culture   Final    NO GROWTH 5 DAYS Performed at [facility]    Report Status 09/15/2016 FINAL  Final  Respiratory Panel by PCR     Status: None   Collection Time: 09/13/16  8:14 AM  Result Value Ref Range Status   Adenovirus NOT DETECTED NOT DETECTED Final   Coronavirus 229E NOT DETECTED NOT DETECTED Final   Coronavirus HKU1 NOT DETECTED NOT DETECTED Final   Coronavirus NL63 NOT DETECTED NOT DETECTED Final   Coronavirus OC43 NOT DETECTED NOT DETECTED Final   Metapneumovirus NOT DETECTED NOT DETECTED Final   Rhinovirus / Enterovirus NOT DETECTED NOT DETECTED Final   Influenza A NOT DETECTED NOT DETECTED Final   Influenza B NOT DETECTED NOT DETECTED Final   Parainfluenza Virus 1 NOT DETECTED NOT DETECTED Final   Parainfluenza Virus 2 NOT DETECTED NOT DETECTED Final   Parainfluenza Virus 3 NOT DETECTED NOT DETECTED Final   Parainfluenza Virus 4 NOT DETECTED NOT DETECTED Final   Respiratory Syncytial Virus NOT DETECTED NOT DETECTED Final   Bordetella pertussis NOT DETECTED NOT DETECTED Final   Chlamydophila pneumoniae  NOT DETECTED NOT DETECTED Final   Mycoplasma pneumoniae NOT DETECTED NOT DETECTED Final    Comment: Performed at [facility]      09/16/2016, 12:37 PM     LOS: 8 days    Records and images were personally reviewed where available.

## 2016-09-16 NOTE — Progress Notes (Signed)
Patient temp remains elevated at 103.0 rectally. Made on call MD aware. Not time for Tylenol PRN. PA [place] gave one time order for Motrin suspension. Will give and continue to monitor

## 2016-09-17 DIAGNOSIS — Z5181 Encounter for therapeutic drug level monitoring: Secondary | ICD-10-CM

## 2016-09-17 DIAGNOSIS — A4901 Methicillin susceptible Staphylococcus aureus infection, unspecified site: Secondary | ICD-10-CM

## 2016-09-17 NOTE — Care Management Important Message (Signed)
Important Message  Patient Details  Name: Dawn [name] MRN: [id number] Date of Birth: 30-Sep-1946   Medicare Important Message Given:  Yes    Camillo Flaming 09/17/2016, 10:20 AMImportant Message  Patient Details  Name: Dawn [name] MRN: [id number] Date of Birth: 01-Nov-1945   Medicare Important Message Given:  Yes    Camillo Flaming 09/17/2016, 10:20 AM

## 2016-09-17 NOTE — Progress Notes (Signed)
NP on call made aware of patient HR, Temp  and BP,  order given to hold am lasix

## 2016-09-17 NOTE — Progress Notes (Signed)
PROGRESS NOTE                                                                                                                                                                                                             Patient Demographics:    Dawn [name], is a 70 y.o. female, DOB - [birth year], WV:[id number]  Admit date - 09/08/2016   Admitting Physician Velvet Bathe, MD  Outpatient Primary MD for the patient is Inocencio Homes, MD  LOS - 9  Outpatient Specialists: none  Chief Complaint  Patient presents with  . Altered Mental Status  . Code Sepsis  . Hyperventilating       Brief Narrative   70 year old female with developmental delay, seizure disorder, dementia, A. fib, hypothyroidism who was sent from SNF with fever. She was hospitalized in August this year with aspiration pneumonia. Patient septic in the ED with hypotension, tachycardia, tachypnea and fever of 103.3F. Blood work showed WBC of 20 7.8K, lactic acid of 8.3 and potassium of 5.5. She was started on empiric vancomycin and Zosyn. Patient admitted to stepdown unit and transferred to medical floor 2 days later.    Subjective:   In terms unchanged, continues to be septic with fever and tachycardia.   Assessment  & Plan :    Principal Problem:  Severe Sepsis ([place]) Source unclear. Suspect coag negative staph bacteremia. Restarted IV vancomycin. Repeat blood culture from yesterday was negative. Full infectious workup including:  Respiratory viral panel ,  2-D echo for vegetation,. CT of the chest abdomen and pelvis ultrasound abdomen for source of infection have been negative.  No skin wound are ulcers. No joint swelling or tenderness. -doppler LE neg for DVT. CT angio chest neg for PE. CT head shows mastoid effusion. -ID consult appreciated. Will discuss with them again tomorrow morning. If no improvement despite 48 hours of IV vancomycin again she  may need to be made for comfort.  Active Problems: Acute respiratory failure with hypoxia Secondary to volume overload. Slowly improving. Continue IV Lasix for diuresis.Marland Kitchen 2-D echo with EF 65-70% and no wall motion abnormality. Continue Foley    Seizure disorder ([place]) Continue Keppra, Depakote and clonazepam.    Hypothyroidism Continue Synthroid.  Essential hypertension Metoprolol on hold due to hypotension.  Hypokalemia Replenished    Code Status : DO NOT RESUSCITATE  Family  Communication  : Discussed in detail with sister who is her legal guardian. She understands patient's significant comorbidities and poor quality of life. Discussed again about goals of care and she agrees that once patient is more stable she can be sent back to SNF with palliative care follow-up with goals on keeping her comfortable.  Disposition Plan : return to SNF with pall care follow up vs comfort measures if worsens  Barriers For Discharge : active symptoms  Consults  :   PC CM ID  Procedures  : Ultrasound abdomen CT chest abdomen and pelvis 2-D echo CT angio chest  doppler LE Head CT  DVT Prophylaxis  :  Lovenox -   Lab Results  Component Value Date   PLT 317 09/14/2016    Antibiotics  :   Anti-infectives    Start     Dose/Rate Route Frequency Ordered Stop   09/16/16 1400  vancomycin (VANCOCIN) IVPB 750 mg/150 ml premix     750 mg 150 mL/hr over 60 Minutes Intravenous Every 12 hours 09/16/16 1325     09/14/16 2200  levofloxacin (LEVAQUIN) tablet 750 mg  Status:  Discontinued     750 mg Oral Daily 09/14/16 1532 09/16/16 0759   09/11/16 1100  vancomycin (VANCOCIN) IVPB 750 mg/150 ml premix  Status:  Discontinued     750 mg 150 mL/hr over 60 Minutes Intravenous Every 12 hours 09/11/16 1035 09/14/16 1530   09/09/16 2100  vancomycin (VANCOCIN) 500 mg in sodium chloride 0.9 % 100 mL IVPB  Status:  Discontinued     500 mg 100 mL/hr over 60 Minutes Intravenous Every 12 hours 09/09/16  2050 09/11/16 1035   09/09/16 0200  vancomycin (VANCOCIN) 500 mg in sodium chloride 0.9 % 100 mL IVPB  Status:  Discontinued     500 mg 100 mL/hr over 60 Minutes Intravenous Every 12 hours 09/08/16 1437 09/09/16 1123   09/08/16 2000  piperacillin-tazobactam (ZOSYN) IVPB 3.375 g  Status:  Discontinued     3.375 g 12.5 mL/hr over 240 Minutes Intravenous Every 8 hours 09/08/16 1437 09/14/16 1530   09/08/16 1345  piperacillin-tazobactam (ZOSYN) IVPB 3.375 g     3.375 g 100 mL/hr over 30 Minutes Intravenous  Once 09/08/16 1340 09/08/16 1415   09/08/16 1345  vancomycin (VANCOCIN) IVPB 1000 mg/200 mL premix     1,000 mg 200 mL/hr over 60 Minutes Intravenous STAT 09/08/16 1340 09/08/16 1453        Objective:   Vitals:   09/17/16 0523 09/17/16 0751 09/17/16 1356 09/17/16 1630  BP: 99/66  (!) 90/57   Pulse: (!) 113  (!) 113   Resp: 18  (!) 32   Temp: (!) 102.3 F (39.1 C) (!) 101.1 F (38.4 C) (!) 102.4 F (39.1 C) (!) 102.2 F (39 C)  TempSrc: Rectal Rectal Rectal Rectal  SpO2: 100%  100%   Weight:      Height:        Wt Readings from Last 3 Encounters:  09/08/16 67.7 kg (149 lb 4 oz)  08/23/16 65.4 kg (144 lb 3.2 oz)  07/25/16 63.5 kg (140 lb)     Intake/Output Summary (Last 24 hours) at 09/17/16 1924 Last data filed at 09/17/16 1401  Gross per 24 hour  Intake              300 ml  Output              950 ml  Net             -  650 ml     Physical Exam  [id number], , Poorly arousable HEENT: Moist mucosa, supple neck,  Chest: Scattered rhonchi bilaterally CVS: S1 and S2 tachycardic, no murmurs GI: soft, NT, ND, BS+, G-tube in place ,, Foley+  Musculoskeletal: warm , Trace edema b/l CNS:  nonverbal,    Data Review:    CBC  Recent Labs Lab 09/11/16 0335 09/12/16 0515 09/12/16 1111 09/13/16 0514 09/14/16 0426  WBC 16.6* 16.2* 14.1* 15.7* 16.8*  HGB 10.2* 10.4* 10.0* 10.6* 10.8*  HCT 31.2* 32.2* 30.1* 32.2* 32.8*  PLT 278 297 292 300 317  MCV 102.3*  101.3* 101.3* 100.9* 100.9*  MCH 33.4 32.7 33.7 33.2 33.2  MCHC 32.7 32.3 33.2 32.9 32.9  RDW 16.1* 16.1* 16.1* 16.3* 16.2*    Chemistries   Recent Labs Lab 09/11/16 0335 09/12/16 1353 09/14/16 0426  NA 142 138 141  K 3.5 3.3* 4.1  CL 108 107 98*  CO2 29 24 30   GLUCOSE 146* 119* 149*  BUN 18 13 20   CREATININE 0.55 0.55 0.70  CALCIUM 8.3* 8.2* 8.2*  AST  --  98*  --   ALT  --  43  --   ALKPHOS  --  394*  --   BILITOT  --  1.5*  --    ------------------------------------------------------------------------------------------------------------------ No results for input(s): CHOL, HDL, LDLCALC, TRIG, CHOLHDL, LDLDIRECT in the last 72 hours.  No results found for: HGBA1C ------------------------------------------------------------------------------------------------------------------ No results for input(s): TSH, T4TOTAL, T3FREE, THYROIDAB in the last 72 hours.  Invalid input(s): FREET3 ------------------------------------------------------------------------------------------------------------------ No results for input(s): VITAMINB12, FOLATE, FERRITIN, TIBC, IRON, RETICCTPCT in the last 72 hours.  Coagulation profile No results for input(s): INR, PROTIME in the last 168 hours.  No results for input(s): DDIMER in the last 72 hours.  Cardiac Enzymes No results for input(s): CKMB, TROPONINI, MYOGLOBIN in the last 168 hours.  Invalid input(s): CK ------------------------------------------------------------------------------------------------------------------    Component Value Date/Time   BNP 257.3 (H) 10/27/2014 0015    Inpatient Medications  Scheduled Meds: . chlorhexidine  15 mL Mouth Rinse BID  . clonazePAM  1 mg Per Tube Q8H  . docusate  200 mg Per Tube BID  . feeding supplement (PRO-STAT SUGAR FREE 64)  30 mL Per Tube Daily  . free water  100 mL Per Tube Q4H  . furosemide  80 mg Intravenous Q12H  . levETIRAcetam  500 mg Per Tube Q0600  . levothyroxine  100  mcg Oral QAC breakfast  . mouth rinse  15 mL Mouth Rinse q12n4p  . potassium chloride  40 mEq Oral Daily  . ranitidine  150 mg Per Tube QHS  . sodium chloride flush  3 mL Intravenous Q12H  . TWOCAL HN  1,000 mL Per Tube Q24H  . Valproate Sodium  750 mg Per Tube TID  . vancomycin  750 mg Intravenous Q12H   Continuous Infusions: PRN Meds:.sodium chloride, acetaminophen **OR** acetaminophen, albuterol, diltiazem, iopamidol, sodium chloride flush  Micro Results Recent Results (from the past 240 hour(s))  Culture, blood (Routine x 2)     Status: None   Collection Time: 09/08/16  1:06 PM  Result Value Ref Range Status   Specimen Description BLOOD LEFT HAND  Final   Special Requests BOTTLES DRAWN AEROBIC AND ANAEROBIC 5CC  Final   Culture   Final    NO GROWTH 5 DAYS Performed at [facility]    Report Status 09/13/2016 FINAL  Final  Culture, blood (Routine x 2)     Status: Abnormal  Collection Time: 09/08/16  1:09 PM  Result Value Ref Range Status   Specimen Description BLOOD RIGHT ANTECUBITAL  Final   Special Requests BOTTLES DRAWN AEROBIC AND ANAEROBIC 5CC  Final   Culture  Setup Time   Final    GRAM POSITIVE COCCI IN CLUSTERS IN BOTH AEROBIC AND ANAEROBIC BOTTLES CRITICAL RESULT CALLED TO, READ BACK BY AND VERIFIED WITH: N GLOGOVAC,PHARMD AT 1253 09/09/16 BY L BENFIELD    Culture (A)  Final    STAPHYLOCOCCUS SPECIES (COAGULASE NEGATIVE) THE SIGNIFICANCE OF ISOLATING THIS ORGANISM FROM A SINGLE SET OF BLOOD CULTURES WHEN MULTIPLE SETS ARE DRAWN IS UNCERTAIN. PLEASE NOTIFY THE MICROBIOLOGY DEPARTMENT WITHIN ONE WEEK IF SPECIATION AND SENSITIVITIES ARE REQUIRED. Performed at [facility]    Report Status 09/11/2016 FINAL  Final  Blood Culture ID Panel (Reflexed)     Status: Abnormal   Collection Time: 09/08/16  1:09 PM  Result Value Ref Range Status   Enterococcus species NOT DETECTED NOT DETECTED Final   Listeria monocytogenes NOT DETECTED NOT DETECTED Final    Staphylococcus species DETECTED (A) NOT DETECTED Final    Comment: CRITICAL RESULT CALLED TO, READ BACK BY AND VERIFIED WITH: N GLOGOVAC,PHARMD AT 1253 09/09/16 BY L BENFIELD    Staphylococcus aureus NOT DETECTED NOT DETECTED Final   Methicillin resistance NOT DETECTED NOT DETECTED Final   Streptococcus species NOT DETECTED NOT DETECTED Final   Streptococcus agalactiae NOT DETECTED NOT DETECTED Final   Streptococcus pneumoniae NOT DETECTED NOT DETECTED Final   Streptococcus pyogenes NOT DETECTED NOT DETECTED Final   Acinetobacter baumannii NOT DETECTED NOT DETECTED Final   Enterobacteriaceae species NOT DETECTED NOT DETECTED Final   Enterobacter cloacae complex NOT DETECTED NOT DETECTED Final   Escherichia coli NOT DETECTED NOT DETECTED Final   Klebsiella oxytoca NOT DETECTED NOT DETECTED Final   Klebsiella pneumoniae NOT DETECTED NOT DETECTED Final   Proteus species NOT DETECTED NOT DETECTED Final   Serratia marcescens NOT DETECTED NOT DETECTED Final   Haemophilus influenzae NOT DETECTED NOT DETECTED Final   Neisseria meningitidis NOT DETECTED NOT DETECTED Final   Pseudomonas aeruginosa NOT DETECTED NOT DETECTED Final   Candida albicans NOT DETECTED NOT DETECTED Final   Candida glabrata NOT DETECTED NOT DETECTED Final   Candida krusei NOT DETECTED NOT DETECTED Final   Candida parapsilosis NOT DETECTED NOT DETECTED Final   Candida tropicalis NOT DETECTED NOT DETECTED Final    Comment: Performed at [facility]  Urine culture     Status: None   Collection Time: 09/08/16  3:00 PM  Result Value Ref Range Status   Specimen Description URINE, CATHETERIZED  Final   Special Requests NONE  Final   Culture NO GROWTH Performed at [facility]   Final   Report Status 09/09/2016 FINAL  Final  MRSA PCR Screening     Status: None   Collection Time: 09/08/16 10:21 PM  Result Value Ref Range Status   MRSA by PCR NEGATIVE NEGATIVE Final    Comment:        The GeneXpert MRSA  Assay (FDA approved for NASAL specimens only), is one component of a comprehensive MRSA colonization surveillance program. It is not intended to diagnose MRSA infection nor to guide or monitor treatment for MRSA infections.   Respiratory Panel by PCR     Status: None   Collection Time: 09/09/16  9:43 AM  Result Value Ref Range Status   Adenovirus NOT DETECTED NOT DETECTED Final   Coronavirus 229E NOT DETECTED  NOT DETECTED Final   Coronavirus HKU1 NOT DETECTED NOT DETECTED Final   Coronavirus NL63 NOT DETECTED NOT DETECTED Final   Coronavirus OC43 NOT DETECTED NOT DETECTED Final   Metapneumovirus NOT DETECTED NOT DETECTED Final   Rhinovirus / Enterovirus NOT DETECTED NOT DETECTED Final   Influenza A NOT DETECTED NOT DETECTED Final   Influenza B NOT DETECTED NOT DETECTED Final   Parainfluenza Virus 1 NOT DETECTED NOT DETECTED Final   Parainfluenza Virus 2 NOT DETECTED NOT DETECTED Final   Parainfluenza Virus 3 NOT DETECTED NOT DETECTED Final   Parainfluenza Virus 4 NOT DETECTED NOT DETECTED Final   Respiratory Syncytial Virus NOT DETECTED NOT DETECTED Final   Bordetella pertussis NOT DETECTED NOT DETECTED Final   Chlamydophila pneumoniae NOT DETECTED NOT DETECTED Final   Mycoplasma pneumoniae NOT DETECTED NOT DETECTED Final    Comment: Performed at [facility]  Culture, blood (routine x 2)     Status: Abnormal   Collection Time: 09/10/16 10:29 AM  Result Value Ref Range Status   Specimen Description BLOOD RIGHT HAND  Final   Special Requests IN PEDIATRIC BOTTLE 4CC  Final   Culture  Setup Time   Final    GRAM POSITIVE COCCI IN CLUSTERS IN PEDIATRIC BOTTLE CRITICAL RESULT CALLED TO, READ BACK BY AND VERIFIED WITH: Chales Abrahams, [place] AT [place] ON [id number] BY Rhea Bleacher    Culture (A)  Final    STAPHYLOCOCCUS SPECIES (COAGULASE NEGATIVE) SUSCEPTIBILITIES REQUESTED BY PHARMACY Performed at [facility]    Report Status 09/14/2016 FINAL  Final   Organism ID, Bacteria  STAPHYLOCOCCUS SPECIES (COAGULASE NEGATIVE)  Final      Susceptibility   Staphylococcus species (coagulase negative) - MIC*    CIPROFLOXACIN >=8 RESISTANT Resistant     ERYTHROMYCIN >=8 RESISTANT Resistant     GENTAMICIN <=0.5 SENSITIVE Sensitive     OXACILLIN 0.5 RESISTANT Resistant     TETRACYCLINE <=1 SENSITIVE Sensitive     VANCOMYCIN 1 SENSITIVE Sensitive     TRIMETH/SULFA <=10 SENSITIVE Sensitive     CLINDAMYCIN <=0.25 RESISTANT Resistant     RIFAMPIN <=0.5 SENSITIVE Sensitive     Inducible Clindamycin POSITIVE Resistant     * STAPHYLOCOCCUS SPECIES (COAGULASE NEGATIVE)  Culture, blood (routine x 2)     Status: None   Collection Time: 09/10/16 10:37 AM  Result Value Ref Range Status   Specimen Description BLOOD LEFT HAND  Final   Special Requests BOTTLES DRAWN AEROBIC ONLY 5CC  Final   Culture   Final    NO GROWTH 5 DAYS Performed at [facility]    Report Status 09/15/2016 FINAL  Final  Respiratory Panel by PCR     Status: None   Collection Time: 09/13/16  8:14 AM  Result Value Ref Range Status   Adenovirus NOT DETECTED NOT DETECTED Final   Coronavirus 229E NOT DETECTED NOT DETECTED Final   Coronavirus HKU1 NOT DETECTED NOT DETECTED Final   Coronavirus NL63 NOT DETECTED NOT DETECTED Final   Coronavirus OC43 NOT DETECTED NOT DETECTED Final   Metapneumovirus NOT DETECTED NOT DETECTED Final   Rhinovirus / Enterovirus NOT DETECTED NOT DETECTED Final   Influenza A NOT DETECTED NOT DETECTED Final   Influenza B NOT DETECTED NOT DETECTED Final   Parainfluenza Virus 1 NOT DETECTED NOT DETECTED Final   Parainfluenza Virus 2 NOT DETECTED NOT DETECTED Final   Parainfluenza Virus 3 NOT DETECTED NOT DETECTED Final   Parainfluenza Virus 4 NOT DETECTED NOT DETECTED Final  Respiratory Syncytial Virus NOT DETECTED NOT DETECTED Final   Bordetella pertussis NOT DETECTED NOT DETECTED Final   Chlamydophila pneumoniae NOT DETECTED NOT DETECTED Final   Mycoplasma pneumoniae NOT  DETECTED NOT DETECTED Final    Comment: Performed at [facility]  Culture, blood (Routine X 2) w Reflex to ID Panel     Status: None (Preliminary result)   Collection Time: 09/16/16  1:22 PM  Result Value Ref Range Status   Specimen Description BLOOD RIGHT HAND  Final   Special Requests BOTTLES DRAWN AEROBIC AND ANAEROBIC  5 CC EACH  Final   Culture   Final    NO GROWTH < 24 HOURS Performed at [facility]    Report Status PENDING  Incomplete    Radiology Reports Ct Head Wo Contrast  Result Date: 09/16/2016 CLINICAL DATA:  70 year old female with history of fever and sepsis. EXAM: CT HEAD WITHOUT CONTRAST TECHNIQUE: Contiguous axial images were obtained from the base of the skull through the vertex without intravenous contrast. COMPARISON:  Head CT 08/03/2014. FINDINGS: Brain: No evidence of acute infarction, hemorrhage, hydrocephalus, extra-axial collection or mass lesion/mass effect. Mild cerebral and cerebellar atrophy. More focal area of volume loss in the left parieto-occipital region with associated enlargement of the atrium of the left lateral ventricle, potentially related to perinatal leukomalacia or encephalomalacia from prior infarction. Physiologic calcification of the basal ganglia bilaterally (left greater than right). Vascular: No hyperdense vessel or unexpected calcification. Skull: Hyperostosis. No acute displaced fracture. Large left mastoid effusion. Sinuses/Orbits: No acute finding. Other: None. IMPRESSION: 1. No acute intracranial abnormalities. 2. Large left mastoid effusion. Clinical correlation for signs and symptoms of mastoiditis is recommended. 3. Mild cerebral and cerebellar atrophy. 4. More focal atrophy in the left parieto-occipital region with associated dilatation of the atrium of the left lateral ventricle, similar to prior studies, potentially related to perinatal leukomalacia or prior infarct. Electronically Signed   By: Vinnie Langton M.D.   On:  09/16/2016 10:29   Ct Chest W Contrast  Result Date: 09/11/2016 CLINICAL DATA:  Sepsis EXAM: CT CHEST, ABDOMEN, AND PELVIS WITH CONTRAST TECHNIQUE: Multidetector CT imaging of the chest, abdomen and pelvis was performed following the standard protocol during bolus administration of intravenous contrast. CONTRAST:  164mL ISOVUE-300 IOPAMIDOL (ISOVUE-300) INJECTION 61% COMPARISON:  06/01/2014 FINDINGS: CT CHEST FINDINGS Cardiovascular: There is no evidence of aortic aneurysm, dissection, or intramural hematoma. Mild coronary artery calcifications in the LAD territory. Mitral annular calcification. Mediastinum/Nodes: No evidence of abnormal mediastinal adenopathy no pericardial effusion. No abnormal axillary adenopathy. Lungs/Pleura: Small bilateral pleural effusions. No pneumothorax. There is dependent atelectasis bilaterally. Hazy ground-glass throughout the remainder of the lungs likely reflects hypoaeration. Musculoskeletal: There is no vertebral compression deformity in the thoracic spine. CT ABDOMEN PELVIS FINDINGS Hepatobiliary: Diffuse hepatic steatosis.  Postcholecystectomy. Pancreas: There is stranding surrounding the head and body of the pancreas. There is no evidence of pancreatic necrosis or hemorrhage. There is no obvious focal mass in the pancreas. Heterogeneity may simply reflect streak artifact. Spleen: Unremarkable. Adrenals/Urinary Tract: Left kidney remains severely atrophic. Adrenal glands are unremarkable. There is hypertrophy of the right kidney. Bladder is within normal limits. Stomach/Bowel: Gastrostomy tube is in place. There is a foreign body in the stomach that is unchanged compare with the prior study the colon is decompressed. The appendix is within normal limits and contains contrast. There is no obvious mass in the colon. No evidence of small-bowel obstruction. Vascular/Lymphatic: There is no evidence of aortic aneurysm. Mild atherosclerotic  calcifications of the aorta and iliac  vasculature are noted. No abnormal retroperitoneal adenopathy. Small para-aortic and mesenteric nodes. Reproductive: Uterus contains a single calcification likely related to an old fibroid. No obvious ovarian mass. Other: There is a small amount of free fluid in the upper pelvis and right lower quadrant of the abdomen associated with distal small bowel loops and the uterus. There is also stranding within the mesenteric fat as well as along the course of the ascending and descending colon. There is stranding of the subcutaneous fat as well. These findings may all simply represent volume overload state. Inflammatory process is not excluded. Musculoskeletal: No vertebral compression deformity. IMPRESSION: Small bilateral pleural effusions and dependent atelectasis. Small amount of free fluid in the lower abdomen and pelvis. This is nonspecific and may be related to volume overload. An inflammatory process is not excluded. Foreign body remains in the stomach Create Gastrostomy tube is in place. There is stranding about the pancreas which may be related to volume overload. A focal inflammatory process is not excluded. Electronically Signed   By: Marybelle Killings M.D.   On: 09/11/2016 12:41   Ct Angio Chest Pe W Or Wo Contrast  Result Date: 09/16/2016 CLINICAL DATA:  70 year old female with history of sepsis and fever. EXAM: CT ANGIOGRAPHY CHEST WITH CONTRAST TECHNIQUE: Multidetector CT imaging of the chest was performed using the standard protocol during bolus administration of intravenous contrast. Multiplanar CT image reconstructions and MIPs were obtained to evaluate the vascular anatomy. CONTRAST:  100 mL of Isovue 370. COMPARISON:  Multiple priors, most recently chest CT 09/11/2016. FINDINGS: Comment: Today's study is limited by considerable patient respiratory motion such that accurate assessment for distal segmental and subsegmental sized emboli is not possible on today's examination. Cardiovascular: Respiratory  motion limits assessment of pulmonary vasculature. With these limitations in mind, there is no central, lobar or proximal segmental sized filling defect to suggest pulmonary embolism. More distal segmental and subsegmental sized emboli cannot be entirely excluded. Heart size is normal. There is no significant pericardial fluid, thickening or pericardial calcification. There is aortic atherosclerosis, as well as atherosclerosis of the great vessels of the mediastinum and the coronary arteries, including calcified atherosclerotic plaque in the left main and left circumflex coronary arteries. Calcifications and thickening of the aortic valve. Calcifications of the mitral annulus. Mediastinum/Nodes: No pathologically enlarged mediastinal or hilar lymph nodes. Esophagus is unremarkable in appearance. No axillary lymphadenopathy. Lungs/Pleura: Trace left pleural effusion lying dependently. Low lung volumes. Patchy areas of very mild ground-glass attenuation and septal thickening noted throughout the lungs bilaterally, favored to largely be artifactual from low lung volumes, although some developing multifocal airspace disease is not excluded. No definite suspicious appearing pulmonary nodules or masses are noted on today's motion limited examination. Upper Abdomen: Percutaneous gastrostomy tube with tip in the distal body of the stomach. Unusual nearly tubular appearing foreign body is again noted in the stomach measuring approximately 3.1 x 8.9 x 3.3 cm. In retrospect, this foreign body has been present compared to prior CT dating back to [id number] and is of uncertain etiology. Diffuse low-attenuation in the liver suggestive of underlying hepatic steatosis (difficult to say for certain on today's contrast enhanced CT examination. Musculoskeletal: There are no aggressive appearing lytic or blastic lesions noted in the visualized portions of the skeleton. Review of the MIP images confirms the above findings. IMPRESSION:  1. Study is limited secondary to patient respiratory motion. With these limitations in mind there is no central, lobar or proximal segmental  sized embolus. Smaller distal segmental and subsegmental sized emboli cannot be entirely excluded. 2. Trace left pleural effusion lying dependently. 3. Low lung volumes with some patchy ill-defined areas of ground-glass attenuation and septal thickening which is largely favored to be artifactual, although the possibility of developing multifocal airspace disease from atypical infection is not excluded. Attention on follow-up chest x-rays is recommended. 4. Aortic atherosclerosis, in addition to left main and left circumflex coronary artery disease. Assessment for potential risk factor modification, dietary therapy or pharmacologic therapy may be warranted, if clinically indicated. 5. There are calcifications of the aortic valve and mitral annulus. Echocardiographic correlation for evaluation of potential valvular dysfunction may be warranted if clinically indicated. 6. Persistent highly unusual appearing foreign body in the stomach, similar to remote prior studies dating back to 12/29/2010. This remains of uncertain etiology and significance. Electronically Signed   By: Vinnie Langton M.D.   On: 09/16/2016 10:58   Ct Abdomen Pelvis W Contrast  Result Date: 09/11/2016 CLINICAL DATA:  Sepsis EXAM: CT CHEST, ABDOMEN, AND PELVIS WITH CONTRAST TECHNIQUE: Multidetector CT imaging of the chest, abdomen and pelvis was performed following the standard protocol during bolus administration of intravenous contrast. CONTRAST:  141mL ISOVUE-300 IOPAMIDOL (ISOVUE-300) INJECTION 61% COMPARISON:  06/01/2014 FINDINGS: CT CHEST FINDINGS Cardiovascular: There is no evidence of aortic aneurysm, dissection, or intramural hematoma. Mild coronary artery calcifications in the LAD territory. Mitral annular calcification. Mediastinum/Nodes: No evidence of abnormal mediastinal adenopathy no  pericardial effusion. No abnormal axillary adenopathy. Lungs/Pleura: Small bilateral pleural effusions. No pneumothorax. There is dependent atelectasis bilaterally. Hazy ground-glass throughout the remainder of the lungs likely reflects hypoaeration. Musculoskeletal: There is no vertebral compression deformity in the thoracic spine. CT ABDOMEN PELVIS FINDINGS Hepatobiliary: Diffuse hepatic steatosis.  Postcholecystectomy. Pancreas: There is stranding surrounding the head and body of the pancreas. There is no evidence of pancreatic necrosis or hemorrhage. There is no obvious focal mass in the pancreas. Heterogeneity may simply reflect streak artifact. Spleen: Unremarkable. Adrenals/Urinary Tract: Left kidney remains severely atrophic. Adrenal glands are unremarkable. There is hypertrophy of the right kidney. Bladder is within normal limits. Stomach/Bowel: Gastrostomy tube is in place. There is a foreign body in the stomach that is unchanged compare with the prior study the colon is decompressed. The appendix is within normal limits and contains contrast. There is no obvious mass in the colon. No evidence of small-bowel obstruction. Vascular/Lymphatic: There is no evidence of aortic aneurysm. Mild atherosclerotic calcifications of the aorta and iliac vasculature are noted. No abnormal retroperitoneal adenopathy. Small para-aortic and mesenteric nodes. Reproductive: Uterus contains a single calcification likely related to an old fibroid. No obvious ovarian mass. Other: There is a small amount of free fluid in the upper pelvis and right lower quadrant of the abdomen associated with distal small bowel loops and the uterus. There is also stranding within the mesenteric fat as well as along the course of the ascending and descending colon. There is stranding of the subcutaneous fat as well. These findings may all simply represent volume overload state. Inflammatory process is not excluded. Musculoskeletal: No vertebral  compression deformity. IMPRESSION: Small bilateral pleural effusions and dependent atelectasis. Small amount of free fluid in the lower abdomen and pelvis. This is nonspecific and may be related to volume overload. An inflammatory process is not excluded. Foreign body remains in the stomach Create Gastrostomy tube is in place. There is stranding about the pancreas which may be related to volume overload. A focal inflammatory process is not excluded. Electronically  Signed   By: Marybelle Killings M.D.   On: 09/11/2016 12:41   Dg Chest Port 1 View  Result Date: 09/12/2016 CLINICAL DATA:  Shortness of Breath EXAM: PORTABLE CHEST 1 VIEW COMPARISON:  09/11/2016 FINDINGS: Cardiac shadow is mildly enlarged but stable. The overall inspiratory effort is poor with crowding of the vascular markings. Bibasilar atelectatic changes are seen. The known pleural effusions are not as well appreciated as on the prior CT examination. No bony abnormality is seen. IMPRESSION: Stable bibasilar atelectasis. Overall poor inspiratory effort with crowding of the vascular markings. The known pleural effusions are not well appreciated. Electronically Signed   By: Inez Catalina M.D.   On: 09/12/2016 14:17   Dg Chest Portable 1 View  Result Date: 09/08/2016 CLINICAL DATA:  70 year old female with a history of sepsis EXAM: PORTABLE CHEST 1 VIEW COMPARISON:  06/03/2016, 02/08/2016, 11/11/2014 FINDINGS: Significant right rotation somewhat limits evaluation. Low lung volumes accentuating the interstitium. Interstitial opacities bilaterally. No pneumothorax identified.  No large pleural effusion. No large confluent airspace disease. IMPRESSION: Limited exam given the positioning with low lung volumes and likely atelectasis. No definite lobar pneumonia. Signed, Dulcy Fanny. Earleen Newport, DO [facility] Electronically Signed   By: Corrie Mckusick D.O.   On: 09/08/2016 13:31   US Abdomen Limited  Ruq  Result Date: 09/09/2016 CLINICAL DATA:  Sepsis.  Prior cholecystectomy. EXAM: US ABDOMEN LIMITED - RIGHT UPPER QUADRANT COMPARISON:  None. FINDINGS: Limited exam due to body habitus. Gallbladder: Surgically absent. Common bile duct: Diameter: 2.3 mm Liver: Diffusely increased in echogenicity. IMPRESSION: Surgically absent gallbladder.  No biliary ductal dilatation. Hepatic steatosis. Electronically Signed   By: Lovey Newcomer M.D.   On: 09/09/2016 10:44    Time Spent in minutes  35   Louellen Molder M.D on 09/17/2016 at 7:24 PM  Between 7am to 7pm - Pager - [phone]  After 7pm go to [email or web address] - password [facility]  [facility] -  Office  [phone]

## 2016-09-17 NOTE — Progress Notes (Signed)
[facility]   Reason for visit: Follow up on fever  Interval History: repeat blood culture sent yesterday. Remains febrile, on vancomycin for repeated positive CoNS blood culture, also in urine, though interestingly is MSSE instead of resistant.   Physical Exam: Constitutional:  Vitals:   09/17/16 0751 09/17/16 1356  BP:  (!) 90/57  Pulse:  (!) 113  Resp:  (!) 32  Temp: (!) 101.1 F (38.4 C) (!) 102.4 F (39.1 C)   patient appears in NAD, non verbal, no interaction Respiratory: Normal respiratory effort; CTA B Cardiovascular: RRR GI: soft, nt  Review of Systems: Unable to be assessed due to mental status  Lab Results  Component Value Date   WBC 16.8 (H) 09/14/2016   HGB 10.8 (L) 09/14/2016   HCT 32.8 (L) 09/14/2016   MCV 100.9 (H) 09/14/2016   PLT 317 09/14/2016    Lab Results  Component Value Date   CREATININE 0.70 09/14/2016   BUN 20 09/14/2016   NA 141 09/14/2016   K 4.1 09/14/2016   CL 98 (L) 09/14/2016   CO2 30 09/14/2016    Lab Results  Component Value Date   ALT 43 09/12/2016   AST 98 (H) 09/12/2016   ALKPHOS 394 (H) 09/12/2016     Microbiology: Recent Results (from the past 240 hour(s))  Culture, blood (Routine x 2)     Status: None   Collection Time: 09/08/16  1:06 PM  Result Value Ref Range Status   Specimen Description BLOOD LEFT HAND  Final   Special Requests BOTTLES DRAWN AEROBIC AND ANAEROBIC 5CC  Final   Culture   Final    NO GROWTH 5 DAYS Performed at [facility]    Report Status 09/13/2016 FINAL  Final  Culture, blood (Routine x 2)     Status: Abnormal   Collection Time: 09/08/16  1:09 PM  Result Value Ref Range Status   Specimen Description BLOOD RIGHT ANTECUBITAL  Final   Special Requests BOTTLES DRAWN AEROBIC AND ANAEROBIC 5CC  Final   Culture  Setup Time   Final    GRAM POSITIVE COCCI IN CLUSTERS IN BOTH AEROBIC AND ANAEROBIC BOTTLES CRITICAL RESULT CALLED TO, READ BACK BY AND VERIFIED  WITH: N GLOGOVAC,PHARMD AT 1253 09/09/16 BY L BENFIELD    Culture (A)  Final    STAPHYLOCOCCUS SPECIES (COAGULASE NEGATIVE) THE SIGNIFICANCE OF ISOLATING THIS ORGANISM FROM A SINGLE SET OF BLOOD CULTURES WHEN MULTIPLE SETS ARE DRAWN IS UNCERTAIN. PLEASE NOTIFY THE MICROBIOLOGY DEPARTMENT WITHIN ONE WEEK IF SPECIATION AND SENSITIVITIES ARE REQUIRED. Performed at [facility]    Report Status 09/11/2016 FINAL  Final  Blood Culture ID Panel (Reflexed)     Status: Abnormal   Collection Time: 09/08/16  1:09 PM  Result Value Ref Range Status   Enterococcus species NOT DETECTED NOT DETECTED Final   Listeria monocytogenes NOT DETECTED NOT DETECTED Final   Staphylococcus species DETECTED (A) NOT DETECTED Final    Comment: CRITICAL RESULT CALLED TO, READ BACK BY AND VERIFIED WITH: N GLOGOVAC,PHARMD AT 1253 09/09/16 BY L BENFIELD    Staphylococcus aureus NOT DETECTED NOT DETECTED Final   Methicillin resistance NOT DETECTED NOT DETECTED Final   Streptococcus species NOT DETECTED NOT DETECTED Final   Streptococcus agalactiae NOT DETECTED NOT DETECTED Final   Streptococcus pneumoniae NOT DETECTED NOT DETECTED Final   Streptococcus pyogenes NOT DETECTED NOT DETECTED Final   Acinetobacter baumannii NOT DETECTED NOT DETECTED Final   Enterobacteriaceae species NOT  DETECTED NOT DETECTED Final   Enterobacter cloacae complex NOT DETECTED NOT DETECTED Final   Escherichia coli NOT DETECTED NOT DETECTED Final   Klebsiella oxytoca NOT DETECTED NOT DETECTED Final   Klebsiella pneumoniae NOT DETECTED NOT DETECTED Final   Proteus species NOT DETECTED NOT DETECTED Final   Serratia marcescens NOT DETECTED NOT DETECTED Final   Haemophilus influenzae NOT DETECTED NOT DETECTED Final   Neisseria meningitidis NOT DETECTED NOT DETECTED Final   Pseudomonas aeruginosa NOT DETECTED NOT DETECTED Final   Candida albicans NOT DETECTED NOT DETECTED Final   Candida glabrata NOT DETECTED NOT DETECTED Final   Candida  krusei NOT DETECTED NOT DETECTED Final   Candida parapsilosis NOT DETECTED NOT DETECTED Final   Candida tropicalis NOT DETECTED NOT DETECTED Final    Comment: Performed at [facility]  Urine culture     Status: None   Collection Time: 09/08/16  3:00 PM  Result Value Ref Range Status   Specimen Description URINE, CATHETERIZED  Final   Special Requests NONE  Final   Culture NO GROWTH Performed at [facility]   Final   Report Status 09/09/2016 FINAL  Final  MRSA PCR Screening     Status: None   Collection Time: 09/08/16 10:21 PM  Result Value Ref Range Status   MRSA by PCR NEGATIVE NEGATIVE Final    Comment:        The GeneXpert MRSA Assay (FDA approved for NASAL specimens only), is one component of a comprehensive MRSA colonization surveillance program. It is not intended to diagnose MRSA infection nor to guide or monitor treatment for MRSA infections.   Respiratory Panel by PCR     Status: None   Collection Time: 09/09/16  9:43 AM  Result Value Ref Range Status   Adenovirus NOT DETECTED NOT DETECTED Final   Coronavirus 229E NOT DETECTED NOT DETECTED Final   Coronavirus HKU1 NOT DETECTED NOT DETECTED Final   Coronavirus NL63 NOT DETECTED NOT DETECTED Final   Coronavirus OC43 NOT DETECTED NOT DETECTED Final   Metapneumovirus NOT DETECTED NOT DETECTED Final   Rhinovirus / Enterovirus NOT DETECTED NOT DETECTED Final   Influenza A NOT DETECTED NOT DETECTED Final   Influenza B NOT DETECTED NOT DETECTED Final   Parainfluenza Virus 1 NOT DETECTED NOT DETECTED Final   Parainfluenza Virus 2 NOT DETECTED NOT DETECTED Final   Parainfluenza Virus 3 NOT DETECTED NOT DETECTED Final   Parainfluenza Virus 4 NOT DETECTED NOT DETECTED Final   Respiratory Syncytial Virus NOT DETECTED NOT DETECTED Final   Bordetella pertussis NOT DETECTED NOT DETECTED Final   Chlamydophila pneumoniae NOT DETECTED NOT DETECTED Final   Mycoplasma pneumoniae NOT DETECTED NOT DETECTED Final     Comment: Performed at [facility]  Culture, blood (routine x 2)     Status: Abnormal   Collection Time: 09/10/16 10:29 AM  Result Value Ref Range Status   Specimen Description BLOOD RIGHT HAND  Final   Special Requests IN PEDIATRIC BOTTLE 4CC  Final   Culture  Setup Time   Final    GRAM POSITIVE COCCI IN CLUSTERS IN PEDIATRIC BOTTLE CRITICAL RESULT CALLED TO, READ BACK BY AND VERIFIED WITH: Chales Abrahams, [place] [facility] [place] ON [id number] BY Rhea Bleacher    Culture (A)  Final    STAPHYLOCOCCUS SPECIES (COAGULASE NEGATIVE) SUSCEPTIBILITIES REQUESTED BY PHARMACY Performed at [facility]    Report Status 09/14/2016 FINAL  Final   Organism ID, Bacteria STAPHYLOCOCCUS SPECIES (COAGULASE NEGATIVE)  Final  Susceptibility   Staphylococcus species (coagulase negative) - MIC*    CIPROFLOXACIN >=8 RESISTANT Resistant     ERYTHROMYCIN >=8 RESISTANT Resistant     GENTAMICIN <=0.5 SENSITIVE Sensitive     OXACILLIN 0.5 RESISTANT Resistant     TETRACYCLINE <=1 SENSITIVE Sensitive     VANCOMYCIN 1 SENSITIVE Sensitive     TRIMETH/SULFA <=10 SENSITIVE Sensitive     CLINDAMYCIN <=0.25 RESISTANT Resistant     RIFAMPIN <=0.5 SENSITIVE Sensitive     Inducible Clindamycin POSITIVE Resistant     * STAPHYLOCOCCUS SPECIES (COAGULASE NEGATIVE)  Culture, blood (routine x 2)     Status: None   Collection Time: 09/10/16 10:37 AM  Result Value Ref Range Status   Specimen Description BLOOD LEFT HAND  Final   Special Requests BOTTLES DRAWN AEROBIC ONLY 5CC  Final   Culture   Final    NO GROWTH 5 DAYS Performed at [facility]    Report Status 09/15/2016 FINAL  Final  Respiratory Panel by PCR     Status: None   Collection Time: 09/13/16  8:14 AM  Result Value Ref Range Status   Adenovirus NOT DETECTED NOT DETECTED Final   Coronavirus 229E NOT DETECTED NOT DETECTED Final   Coronavirus HKU1 NOT DETECTED NOT DETECTED Final   Coronavirus NL63 NOT DETECTED NOT DETECTED Final   Coronavirus OC43 NOT  DETECTED NOT DETECTED Final   Metapneumovirus NOT DETECTED NOT DETECTED Final   Rhinovirus / Enterovirus NOT DETECTED NOT DETECTED Final   Influenza A NOT DETECTED NOT DETECTED Final   Influenza B NOT DETECTED NOT DETECTED Final   Parainfluenza Virus 1 NOT DETECTED NOT DETECTED Final   Parainfluenza Virus 2 NOT DETECTED NOT DETECTED Final   Parainfluenza Virus 3 NOT DETECTED NOT DETECTED Final   Parainfluenza Virus 4 NOT DETECTED NOT DETECTED Final   Respiratory Syncytial Virus NOT DETECTED NOT DETECTED Final   Bordetella pertussis NOT DETECTED NOT DETECTED Final   Chlamydophila pneumoniae NOT DETECTED NOT DETECTED Final   Mycoplasma pneumoniae NOT DETECTED NOT DETECTED Final    Comment: Performed at [facility]  Culture, blood (Routine X 2) w Reflex to ID Panel     Status: None (Preliminary result)   Collection Time: 09/16/16  1:22 PM  Result Value Ref Range Status   Specimen Description BLOOD RIGHT HAND  Final   Special Requests BOTTLES DRAWN AEROBIC AND ANAEROBIC  5 CC EACH  Final   Culture   Final    NO GROWTH < 24 HOURS Performed at [facility]    Report Status PENDING  Incomplete    Impression/Plan:  1. Fever - unlcear etiology.  No clear source but with positive blood culture with CoNS I suspect this is most likely.  On vancomyin and will continue.  2. Medication monitoring - will need to follow creat and levels if decision is made to continue antbiotics.

## 2016-09-17 NOTE — Progress Notes (Signed)
Nutrition Follow-up  DOCUMENTATION CODES:   Not applicable  INTERVENTION:  Continue TwoCal HN @ 39 ml/hr via PEG tube with 30 ml Pro-Stat once daily and 100 ml free water every 4 hours. Regimen provides 1972 kcal, 95 grams protein, 1264 ml free water daily.  Will monitor for discussions regarding goals of care and update nutrition intervention as needed.   NUTRITION DIAGNOSIS:   Swallowing difficulty related to dysphagia, lethargy/confusion as evidenced by other (see comment) (long-term PEG tube use to meet kcal/protein needs).  Ongoing.  GOAL:   Patient will meet greater than or equal to 90% of their needs  Met with TF regimen.  MONITOR:   TF tolerance, Weight trends, Labs, Skin, I & O's  REASON FOR ASSESSMENT:   Consult Enteral/tube feeding initiation and management (Continue TF Regimen)  ASSESSMENT:   70 y.o. female with medical history significant of seizure disorder, dementia, A. fib, hypothyroidism who presents with fevers from nursing home. The patient is unable to provide history. History is obtained by EMR and ED physician. Found to have SIRS with unknown source of infection (pulmonary suspected).   -Patient with unclear source for severe sepsis. Remains febrile. -Per chart, if no improvement with in 24-48 hours will aim for comfort measures. Per Disposition Plan, may return to SNF with palliative care follow-up vs comfort measures.   Discussed with RN as patient is nonverbal and no family present at bedside. Patient tolerating home TF regimen via PEG of TwoCal HN @ 39 ml/hr with 30 ml Pro-Stat once daily and 100 ml free water every 4 hours. Confirmed with pharmacy that there is adequate cans of TwoCal HN for patient.   Medications reviewed and include: Colace, Lasix 80 mg Q12hrs, levothyroxine, potassium chloride 40 mEq daily, ranitidine, vancomycin.  Labs reviewed: Lactic Acid (venous) 6.7. No other chem profile since last assessment.  Diet Order:  Diet Heart  Room service appropriate? Yes; Fluid consistency: Thin  Skin:  Reviewed, no issues  Last BM:  09/15/2016  Height:   Ht Readings from Last 1 Encounters:  09/08/16 5' (1.524 m)    Weight:   Wt Readings from Last 1 Encounters:  09/08/16 149 lb 4 oz (67.7 kg)    Ideal Body Weight:  45.45 kg  BMI:  Body mass index is 29.15 kg/m.  Estimated Nutritional Needs:   Kcal:  1800-2000 (~27-30 kcal/kg)  Protein:  85-100 grams (1.3-1.5 grams/kg)  Fluid:  >/= 1.6 L/day (25 ml/kg)  EDUCATION NEEDS:   No education needs identified at this time  Willey Blade, MS, RD, LDN Pager: [phone] After Hours Pager: [phone]

## 2016-09-18 ENCOUNTER — Encounter: Payer: Self-pay | Admitting: Internal Medicine

## 2016-09-18 ENCOUNTER — Non-Acute Institutional Stay (SKILLED_NURSING_FACILITY): Payer: Medicare Other | Admitting: Internal Medicine

## 2016-09-18 DIAGNOSIS — N179 Acute kidney failure, unspecified: Secondary | ICD-10-CM

## 2016-09-18 DIAGNOSIS — R6521 Severe sepsis with septic shock: Secondary | ICD-10-CM

## 2016-09-18 DIAGNOSIS — E875 Hyperkalemia: Secondary | ICD-10-CM

## 2016-09-18 DIAGNOSIS — J9601 Acute respiratory failure with hypoxia: Secondary | ICD-10-CM | POA: Diagnosis not present

## 2016-09-18 DIAGNOSIS — R569 Unspecified convulsions: Secondary | ICD-10-CM

## 2016-09-18 DIAGNOSIS — F0280 Dementia in other diseases classified elsewhere without behavioral disturbance: Secondary | ICD-10-CM

## 2016-09-18 DIAGNOSIS — J810 Acute pulmonary edema: Secondary | ICD-10-CM

## 2016-09-18 DIAGNOSIS — E034 Atrophy of thyroid (acquired): Secondary | ICD-10-CM

## 2016-09-18 DIAGNOSIS — F79 Unspecified intellectual disabilities: Secondary | ICD-10-CM

## 2016-09-18 DIAGNOSIS — A419 Sepsis, unspecified organism: Secondary | ICD-10-CM

## 2016-09-18 DIAGNOSIS — G249 Dystonia, unspecified: Secondary | ICD-10-CM

## 2016-09-18 DIAGNOSIS — F0390 Unspecified dementia without behavioral disturbance: Secondary | ICD-10-CM | POA: Insufficient documentation

## 2016-09-18 LAB — BASIC METABOLIC PANEL
Anion gap: 18 — ABNORMAL HIGH (ref 5–15)
BUN: 76 mg/dL — AB (ref 6–20)
CALCIUM: 8.8 mg/dL — AB (ref 8.9–10.3)
CHLORIDE: 101 mmol/L (ref 101–111)
CO2: 29 mmol/L (ref 22–32)
CREATININE: 1.7 mg/dL — AB (ref 0.44–1.00)
GFR calc non Af Amer: 29 mL/min — ABNORMAL LOW (ref >60)
GFR, EST AFRICAN AMERICAN: 34 mL/min — AB (ref >60)
Glucose, Bld: 137 mg/dL — ABNORMAL HIGH (ref 65–99)
Potassium: 5.2 mmol/L — ABNORMAL HIGH (ref 3.5–5.1)
SODIUM: 148 mmol/L — AB (ref 135–145)

## 2016-09-18 LAB — CBC
HCT: 36.1 % (ref 36.0–46.0)
Hemoglobin: 11.3 g/dL — ABNORMAL LOW (ref 12.0–15.0)
MCH: 33.3 pg (ref 26.0–34.0)
MCHC: 31.3 g/dL (ref 30.0–36.0)
MCV: 106.5 fL — AB (ref 78.0–100.0)
PLATELETS: 298 10*3/uL (ref 150–400)
RBC: 3.39 MIL/uL — ABNORMAL LOW (ref 3.87–5.11)
RDW: 17.6 % — AB (ref 11.5–15.5)
WBC: 23.7 10*3/uL — AB (ref 4.0–10.5)

## 2016-09-18 MED ORDER — FUROSEMIDE 20 MG PO TABS
40.0000 mg | ORAL_TABLET | Freq: Two times a day (BID) | ORAL | 0 refills | Status: AC
Start: 2016-09-18 — End: ?

## 2016-09-18 NOTE — Progress Notes (Signed)
Report given to nurse at [facility]. Patient stable at discharge.

## 2016-09-18 NOTE — Discharge Summary (Signed)
Physician Discharge Summary  Dawn [name] [id number] DOB: [birth year] DOA: 09/08/2016  PCP: Inocencio Homes, MD  Admit date: 09/08/2016 Discharge date: 09/18/2016  Admitted From: Skilled nursing facility Dawn [name] from) Disposition:  Skilled nursing facility with hospice  Recommendations for Outpatient Follow-up:  1. Patient is being discharged back to skilled nursing facility with hospice for comfort care. Prognosis is extremely guarded. As per discussion with patient's sister goal is for full comfort with no aggressive measures including IV fluids, IV antibiotics or hospitalizations.   Equipment/Devices: Oxygen for comfort  Discharge Condition: Guarded CODE STATUS: DO NOT RESUSCITATE/comfort care Diet recommendation: Do feeds as tolerated   Discharge Diagnoses:  Principal Problem:   Severe sepsis with septic shock (Dawn [name])   Active Problems:   Seizure disorder (HCC)   Hypothyroidism   PEG (percutaneous endoscopic gastrostomy) status ([place])   Leukocytosis   Acute respiratory failure with hypoxia (HCC)   At risk for shortness of breath   Acute pulmonary edema (HCC)   Mental retardation   Acute kidney injury (Dawn [name])   Hyperkalemia  Brief narrative/history of present illness 70 year old female with developmental delay, seizure disorder, dementia, A. fib, hypothyroidism who was sent from SNF with fever. She was hospitalized in August this year with aspiration pneumonia. Patient septic in the ED with hypotension, tachycardia, tachypnea and fever of 103.76F. Blood work showed WBC of 20 7.8K, lactic acid of 8.3 and potassium of 5.5. She was started on empiric vancomycin and Zosyn. Patient admitted to stepdown unit and transferred to medical floor 2 days later. Hospital course prolonged due to persistent sepsis without clear underlying cause.  Principal Problem:  Severe Sepsis ([place]) Source unclear. Suspect coag negative staph bacteremia. However she continues to be febrile and  septic despite restarting IV vancomycin for last 48 hours. Repeat blood cultures from 12/3 have been negative. Full infectious workup including:  Respiratory viral panel ,  2-D echo for vegetation,. CT of the chest abdomen and pelvis ultrasound abdomen for source of infection have been negative.  No skin wound are ulcers. No joint swelling or tenderness. -doppler LE neg for DVT. CT angio chest neg for PE. CT head shows mastoid effusion. -Discussed again with ID. Given lack of improvement despite resuming IV vancomycin we have decided to discontinue antibiotics.   -Discussed with sister on the phone in detail given her poor overall prognosis and lack of improvement despite multiple tests and aggressive treatment. She agrees with patient returning back to skilled nursing facility with hospice for full comfort.  Active Problems: Acute respiratory failure with hypoxia Secondary to volume overload. Received IV Lasix for diuresis. 2-D echo with EF 65-70% and no wall motion abnormality. Continue Foley upon discharge.    Seizure disorder (HCC) Continue Keppra, Depakote and clonazepam.    Hypothyroidism Continue Synthroid.  Essential hypertension Metoprolol held due to hypotension.  Hypokalemia/hypokalemia  Acute kidney injury Likely secondary to diuresis, ongoing sepsis and? Vancomycin toxicity. No further lab draws. Continue Lasix for symptomatic management.     Family Communication  :  I have had multiple discussions with patient's sister Dawn [name] on the phone about her persistent sepsis and poor overall quality of life. Sister understand that patient is bedbound, nonverbal and has poor quality of life. I explained to her about her ongoing sepsis without clear underlying cause and lack of improvement. She understand that patient is progressively deteriorating and has a very poor overall prognosis. Discussed options for hospice and she agrees with making her comfortable, no aggressive  measures, no further  antibiotics and no rehospitalization.  Disposition Plan : return to SNF with going for hospice and comfort care.  Consults  :   PC CM ID  Procedures  : Ultrasound abdomen CT chest abdomen and pelvis 2-D echo CT angio chest  doppler LE Head CT  Discharge Instructions     Medication List    STOP taking these medications   CENTAMIN Liqd   famotidine 20 MG tablet Commonly known as:  PEPCID   potassium chloride 20 MEQ/15ML (10%) Soln  metoprolol tartrate 25 MG tablet Commonly known as:  LOPRESSOR     TAKE these medications   acetaminophen 650 MG suppository Commonly known as:  TYLENOL Place 1 suppository (650 mg total) rectally every 6 (six) hours as needed for mild pain (or Fever >/= 101).   clonazePAM 1 MG tablet Commonly known as:  KLONOPIN Place 1 mg into feeding tube every 8 (eight) hours.   docusate 50 MG/5ML liquid Commonly known as:  COLACE Place 20 mLs (200 mg total) into feeding tube 2 (two) times daily.   feeding supplement (PRO-STAT SUGAR FREE 64) Liqd Place 30 mLs into feeding tube daily.   free water Soln Place 100 mLs into feeding tube every 4 (four) hours.   furosemide 20 MG tablet Commonly known as:  LASIX 2 tablets (40 mg total) by Per J Tube route 2 (two) times daily. What changed:  how much to take  how to take this   levETIRAcetam 100 MG/ML solution Commonly known as:  KEPPRA Place 5 mLs (500 mg total) into feeding tube daily at 6 (six) AM.   levothyroxine 100 MCG tablet Commonly known as:  SYNTHROID, LEVOTHROID 100 mcg by PEG Tube route daily before breakfast.      NUTRITIONAL SUPPLEMENT PO Two cal hn via peg at 39 cc/hr x 24 hours continuously   Valproic Acid 250 MG/5ML Syrp syrup Commonly known as:  DEPAKENE Place 15 mLs (750 mg total) into feeding tube 3 (three) times daily.      Contact information for after-discharge care    Destination    [facility] Follow up.    Specialty:  [place] information: [place] [phone]             Allergies  Allergen Reactions  . Carvedilol   . Ppd [Tuberculin Purified Protein Derivative]     Per MAR        Procedures/Studies: Ct Head Wo Contrast  Result Date: 09/16/2016 CLINICAL DATA:  70 year old female with history of fever and sepsis. EXAM: CT HEAD WITHOUT CONTRAST TECHNIQUE: Contiguous axial images were obtained from the base of the skull through the vertex without intravenous contrast. COMPARISON:  Head CT 08/03/2014. FINDINGS: Brain: No evidence of acute infarction, hemorrhage, hydrocephalus, extra-axial collection or mass lesion/mass effect. Mild cerebral and cerebellar atrophy. More focal area of volume loss in the left parieto-occipital region with associated enlargement of the atrium of the left lateral ventricle, potentially related to perinatal leukomalacia or encephalomalacia from prior infarction. Physiologic calcification of the basal ganglia bilaterally (left greater than right). Vascular: No hyperdense vessel or unexpected calcification. Skull: Hyperostosis. No acute displaced fracture. Large left mastoid effusion. Sinuses/Orbits: No acute finding. Other: None. IMPRESSION: 1. No acute intracranial abnormalities. 2. Large left mastoid effusion. Clinical correlation for signs and symptoms of mastoiditis is recommended. 3. Mild cerebral and cerebellar atrophy. 4. More focal atrophy in the left parieto-occipital region with associated dilatation of the atrium  of the left lateral ventricle, similar to prior studies, potentially related to perinatal leukomalacia or prior infarct. Electronically Signed   By: Vinnie Langton M.D.   On: 09/16/2016 10:29   Ct Chest W Contrast  Result Date: 09/11/2016 CLINICAL DATA:  Sepsis EXAM: CT CHEST, ABDOMEN, AND PELVIS WITH CONTRAST TECHNIQUE: Multidetector CT imaging of the chest, abdomen and pelvis  was performed following the standard protocol during bolus administration of intravenous contrast. CONTRAST:  136mL ISOVUE-300 IOPAMIDOL (ISOVUE-300) INJECTION 61% COMPARISON:  06/01/2014 FINDINGS: CT CHEST FINDINGS Cardiovascular: There is no evidence of aortic aneurysm, dissection, or intramural hematoma. Mild coronary artery calcifications in the LAD territory. Mitral annular calcification. Mediastinum/Nodes: No evidence of abnormal mediastinal adenopathy no pericardial effusion. No abnormal axillary adenopathy. Lungs/Pleura: Small bilateral pleural effusions. No pneumothorax. There is dependent atelectasis bilaterally. Hazy ground-glass throughout the remainder of the lungs likely reflects hypoaeration. Musculoskeletal: There is no vertebral compression deformity in the thoracic spine. CT ABDOMEN PELVIS FINDINGS Hepatobiliary: Diffuse hepatic steatosis.  Postcholecystectomy. Pancreas: There is stranding surrounding the head and body of the pancreas. There is no evidence of pancreatic necrosis or hemorrhage. There is no obvious focal mass in the pancreas. Heterogeneity may simply reflect streak artifact. Spleen: Unremarkable. Adrenals/Urinary Tract: Left kidney remains severely atrophic. Adrenal glands are unremarkable. There is hypertrophy of the right kidney. Bladder is within normal limits. Stomach/Bowel: Gastrostomy tube is in place. There is a foreign body in the stomach that is unchanged compare with the prior study the colon is decompressed. The appendix is within normal limits and contains contrast. There is no obvious mass in the colon. No evidence of small-bowel obstruction. Vascular/Lymphatic: There is no evidence of aortic aneurysm. Mild atherosclerotic calcifications of the aorta and iliac vasculature are noted. No abnormal retroperitoneal adenopathy. Small para-aortic and mesenteric nodes. Reproductive: Uterus contains a single calcification likely related to an old fibroid. No obvious ovarian mass.  Other: There is a small amount of free fluid in the upper pelvis and right lower quadrant of the abdomen associated with distal small bowel loops and the uterus. There is also stranding within the mesenteric fat as well as along the course of the ascending and descending colon. There is stranding of the subcutaneous fat as well. These findings may all simply represent volume overload state. Inflammatory process is not excluded. Musculoskeletal: No vertebral compression deformity. IMPRESSION: Small bilateral pleural effusions and dependent atelectasis. Small amount of free fluid in the lower abdomen and pelvis. This is nonspecific and may be related to volume overload. An inflammatory process is not excluded. Foreign body remains in the stomach Create Gastrostomy tube is in place. There is stranding about the pancreas which may be related to volume overload. A focal inflammatory process is not excluded. Electronically Signed   By: Marybelle Killings M.D.   On: 09/11/2016 12:41   Ct Angio Chest Pe W Or Wo Contrast  Result Date: 09/16/2016 CLINICAL DATA:  70 year old female with history of sepsis and fever. EXAM: CT ANGIOGRAPHY CHEST WITH CONTRAST TECHNIQUE: Multidetector CT imaging of the chest was performed using the standard protocol during bolus administration of intravenous contrast. Multiplanar CT image reconstructions and MIPs were obtained to evaluate the vascular anatomy. CONTRAST:  100 mL of Isovue 370. COMPARISON:  Multiple priors, most recently chest CT 09/11/2016. FINDINGS: Comment: Today's study is limited by considerable patient respiratory motion such that accurate assessment for distal segmental and subsegmental sized emboli is not possible on today's examination. Cardiovascular: Respiratory motion limits assessment of pulmonary vasculature. With  these limitations in mind, there is no central, lobar or proximal segmental sized filling defect to suggest pulmonary embolism. More distal segmental and  subsegmental sized emboli cannot be entirely excluded. Heart size is normal. There is no significant pericardial fluid, thickening or pericardial calcification. There is aortic atherosclerosis, as well as atherosclerosis of the great vessels of the mediastinum and the coronary arteries, including calcified atherosclerotic plaque in the left main and left circumflex coronary arteries. Calcifications and thickening of the aortic valve. Calcifications of the mitral annulus. Mediastinum/Nodes: No pathologically enlarged mediastinal or hilar lymph nodes. Esophagus is unremarkable in appearance. No axillary lymphadenopathy. Lungs/Pleura: Trace left pleural effusion lying dependently. Low lung volumes. Patchy areas of very mild ground-glass attenuation and septal thickening noted throughout the lungs bilaterally, favored to largely be artifactual from low lung volumes, although some developing multifocal airspace disease is not excluded. No definite suspicious appearing pulmonary nodules or masses are noted on today's motion limited examination. Upper Abdomen: Percutaneous gastrostomy tube with tip in the distal body of the stomach. Unusual nearly tubular appearing foreign body is again noted in the stomach measuring approximately 3.1 x 8.9 x 3.3 cm. In retrospect, this foreign body has been present compared to prior CT dating back to [id number] and is of uncertain etiology. Diffuse low-attenuation in the liver suggestive of underlying hepatic steatosis (difficult to say for certain on today's contrast enhanced CT examination. Musculoskeletal: There are no aggressive appearing lytic or blastic lesions noted in the visualized portions of the skeleton. Review of the MIP images confirms the above findings. IMPRESSION: 1. Study is limited secondary to patient respiratory motion. With these limitations in mind there is no central, lobar or proximal segmental sized embolus. Smaller distal segmental and subsegmental sized emboli  cannot be entirely excluded. 2. Trace left pleural effusion lying dependently. 3. Low lung volumes with some patchy ill-defined areas of ground-glass attenuation and septal thickening which is largely favored to be artifactual, although the possibility of developing multifocal airspace disease from atypical infection is not excluded. Attention on follow-up chest x-rays is recommended. 4. Aortic atherosclerosis, in addition to left main and left circumflex coronary artery disease. Assessment for potential risk factor modification, dietary therapy or pharmacologic therapy may be warranted, if clinically indicated. 5. There are calcifications of the aortic valve and mitral annulus. Echocardiographic correlation for evaluation of potential valvular dysfunction may be warranted if clinically indicated. 6. Persistent highly unusual appearing foreign body in the stomach, similar to remote prior studies dating back to 12/29/2010. This remains of uncertain etiology and significance. Electronically Signed   By: Vinnie Langton M.D.   On: 09/16/2016 10:58   Ct Abdomen Pelvis W Contrast  Result Date: 09/11/2016 CLINICAL DATA:  Sepsis EXAM: CT CHEST, ABDOMEN, AND PELVIS WITH CONTRAST TECHNIQUE: Multidetector CT imaging of the chest, abdomen and pelvis was performed following the standard protocol during bolus administration of intravenous contrast. CONTRAST:  12mL ISOVUE-300 IOPAMIDOL (ISOVUE-300) INJECTION 61% COMPARISON:  06/01/2014 FINDINGS: CT CHEST FINDINGS Cardiovascular: There is no evidence of aortic aneurysm, dissection, or intramural hematoma. Mild coronary artery calcifications in the LAD territory. Mitral annular calcification. Mediastinum/Nodes: No evidence of abnormal mediastinal adenopathy no pericardial effusion. No abnormal axillary adenopathy. Lungs/Pleura: Small bilateral pleural effusions. No pneumothorax. There is dependent atelectasis bilaterally. Hazy ground-glass throughout the remainder of the  lungs likely reflects hypoaeration. Musculoskeletal: There is no vertebral compression deformity in the thoracic spine. CT ABDOMEN PELVIS FINDINGS Hepatobiliary: Diffuse hepatic steatosis.  Postcholecystectomy. Pancreas: There is stranding surrounding the head and  body of the pancreas. There is no evidence of pancreatic necrosis or hemorrhage. There is no obvious focal mass in the pancreas. Heterogeneity may simply reflect streak artifact. Spleen: Unremarkable. Adrenals/Urinary Tract: Left kidney remains severely atrophic. Adrenal glands are unremarkable. There is hypertrophy of the right kidney. Bladder is within normal limits. Stomach/Bowel: Gastrostomy tube is in place. There is a foreign body in the stomach that is unchanged compare with the prior study the colon is decompressed. The appendix is within normal limits and contains contrast. There is no obvious mass in the colon. No evidence of small-bowel obstruction. Vascular/Lymphatic: There is no evidence of aortic aneurysm. Mild atherosclerotic calcifications of the aorta and iliac vasculature are noted. No abnormal retroperitoneal adenopathy. Small para-aortic and mesenteric nodes. Reproductive: Uterus contains a single calcification likely related to an old fibroid. No obvious ovarian mass. Other: There is a small amount of free fluid in the upper pelvis and right lower quadrant of the abdomen associated with distal small bowel loops and the uterus. There is also stranding within the mesenteric fat as well as along the course of the ascending and descending colon. There is stranding of the subcutaneous fat as well. These findings may all simply represent volume overload state. Inflammatory process is not excluded. Musculoskeletal: No vertebral compression deformity. IMPRESSION: Small bilateral pleural effusions and dependent atelectasis. Small amount of free fluid in the lower abdomen and pelvis. This is nonspecific and may be related to volume overload. An  inflammatory process is not excluded. Foreign body remains in the stomach Create Gastrostomy tube is in place. There is stranding about the pancreas which may be related to volume overload. A focal inflammatory process is not excluded. Electronically Signed   By: Marybelle Killings M.D.   On: 09/11/2016 12:41   Dg Chest Port 1 View  Result Date: 09/12/2016 CLINICAL DATA:  Shortness of Breath EXAM: PORTABLE CHEST 1 VIEW COMPARISON:  09/11/2016 FINDINGS: Cardiac shadow is mildly enlarged but stable. The overall inspiratory effort is poor with crowding of the vascular markings. Bibasilar atelectatic changes are seen. The known pleural effusions are not as well appreciated as on the prior CT examination. No bony abnormality is seen. IMPRESSION: Stable bibasilar atelectasis. Overall poor inspiratory effort with crowding of the vascular markings. The known pleural effusions are not well appreciated. Electronically Signed   By: Inez Catalina M.D.   On: 09/12/2016 14:17   Dg Chest Portable 1 View  Result Date: 09/08/2016 CLINICAL DATA:  70 year old female with a history of sepsis EXAM: PORTABLE CHEST 1 VIEW COMPARISON:  06/03/2016, 02/08/2016, 11/11/2014 FINDINGS: Significant right rotation somewhat limits evaluation. Low lung volumes accentuating the interstitium. Interstitial opacities bilaterally. No pneumothorax identified.  No large pleural effusion. No large confluent airspace disease. IMPRESSION: Limited exam given the positioning with low lung volumes and likely atelectasis. No definite lobar pneumonia. Signed, Dulcy Fanny. Earleen Newport, DO [facility] Electronically Signed   By: Corrie Mckusick D.O.   On: 09/08/2016 13:31   US Abdomen Limited Ruq  Result Date: 09/09/2016 CLINICAL DATA:  Sepsis.  Prior cholecystectomy. EXAM: US ABDOMEN LIMITED - RIGHT UPPER QUADRANT COMPARISON:  None. FINDINGS: Limited exam due to body habitus. Gallbladder: Surgically absent.  Common bile duct: Diameter: 2.3 mm Liver: Diffusely increased in echogenicity. IMPRESSION: Surgically absent gallbladder.  No biliary ductal dilatation. Hepatic steatosis. Electronically Signed   By: Lovey Newcomer M.D.   On: 09/09/2016 10:44        Subjective: Continues to respond poorly.  Nonverbal. Remains septic.  Discharge Exam: Vitals:   09/18/16 0625 09/18/16 0927  BP: 114/65   Pulse: (!) 110   Resp: 18   Temp: (!) 101 F (38.3 C) 100 F (37.8 C)   Vitals:   09/17/16 2148 09/18/16 0120 09/18/16 0625 09/18/16 0927  BP: (!) 104/59  114/65   Pulse: (!) 116  (!) 110   Resp: 18  18   Temp: (!) 102 F (38.9 C) (!) 102 F (38.9 C) (!) 101 F (38.3 C) 100 F (37.8 C)  TempSrc: Rectal Rectal Rectal Rectal  SpO2: 99%  100%   Weight:      Height:         [id number], , Poorly arousable HEENT: Moist mucosa, supple neck,  Chest: Scattered rhonchi bilaterally CVS: S1 and S2 tachycardic, no murmurs GI: soft, NT, ND, BS+, G-tube in place ,, Foley+  Musculoskeletal: warm , Trace edema b/l CNS:  nonverbal,   The results of significant diagnostics from this hospitalization (including imaging, microbiology, ancillary and laboratory) are listed below for reference.     Microbiology: Recent Results (from the past 240 hour(s))  Culture, blood (Routine x 2)     Status: None   Collection Time: 09/08/16  1:06 PM  Result Value Ref Range Status   Specimen Description BLOOD LEFT HAND  Final   Special Requests BOTTLES DRAWN AEROBIC AND ANAEROBIC 5CC  Final   Culture   Final    NO GROWTH 5 DAYS Performed at [facility]    Report Status 09/13/2016 FINAL  Final  Culture, blood (Routine x 2)     Status: Abnormal   Collection Time: 09/08/16  1:09 PM  Result Value Ref Range Status   Specimen Description BLOOD RIGHT ANTECUBITAL  Final   Special Requests BOTTLES DRAWN AEROBIC AND ANAEROBIC 5CC  Final   Culture  Setup Time   Final    GRAM POSITIVE COCCI IN CLUSTERS IN BOTH  AEROBIC AND ANAEROBIC BOTTLES CRITICAL RESULT CALLED TO, READ BACK BY AND VERIFIED WITH: N GLOGOVAC,PHARMD AT 1253 09/09/16 BY L BENFIELD    Culture (A)  Final    STAPHYLOCOCCUS SPECIES (COAGULASE NEGATIVE) THE SIGNIFICANCE OF ISOLATING THIS ORGANISM FROM A SINGLE SET OF BLOOD CULTURES WHEN MULTIPLE SETS ARE DRAWN IS UNCERTAIN. PLEASE NOTIFY THE MICROBIOLOGY DEPARTMENT WITHIN ONE WEEK IF SPECIATION AND SENSITIVITIES ARE REQUIRED. Performed at [facility]    Report Status 09/11/2016 FINAL  Final  Blood Culture ID Panel (Reflexed)     Status: Abnormal   Collection Time: 09/08/16  1:09 PM  Result Value Ref Range Status   Enterococcus species NOT DETECTED NOT DETECTED Final   Listeria monocytogenes NOT DETECTED NOT DETECTED Final   Staphylococcus species DETECTED (A) NOT DETECTED Final    Comment: CRITICAL RESULT CALLED TO, READ BACK BY AND VERIFIED WITH: N GLOGOVAC,PHARMD AT 1253 09/09/16 BY L BENFIELD    Staphylococcus aureus NOT DETECTED NOT DETECTED Final   Methicillin resistance NOT DETECTED NOT DETECTED Final   Streptococcus species NOT DETECTED NOT DETECTED Final   Streptococcus agalactiae NOT DETECTED NOT DETECTED Final   Streptococcus pneumoniae NOT DETECTED NOT DETECTED Final   Streptococcus pyogenes NOT DETECTED NOT DETECTED Final   Acinetobacter baumannii NOT DETECTED NOT DETECTED Final   Enterobacteriaceae species NOT DETECTED NOT DETECTED Final   Enterobacter cloacae complex NOT DETECTED NOT DETECTED Final   Escherichia coli NOT DETECTED NOT DETECTED Final   Klebsiella oxytoca NOT DETECTED NOT DETECTED Final   Klebsiella pneumoniae NOT  DETECTED NOT DETECTED Final   Proteus species NOT DETECTED NOT DETECTED Final   Serratia marcescens NOT DETECTED NOT DETECTED Final   Haemophilus influenzae NOT DETECTED NOT DETECTED Final   Neisseria meningitidis NOT DETECTED NOT DETECTED Final   Pseudomonas aeruginosa NOT DETECTED NOT DETECTED Final   Candida albicans NOT DETECTED  NOT DETECTED Final   Candida glabrata NOT DETECTED NOT DETECTED Final   Candida krusei NOT DETECTED NOT DETECTED Final   Candida parapsilosis NOT DETECTED NOT DETECTED Final   Candida tropicalis NOT DETECTED NOT DETECTED Final    Comment: Performed at [facility]  Urine culture     Status: None   Collection Time: 09/08/16  3:00 PM  Result Value Ref Range Status   Specimen Description URINE, CATHETERIZED  Final   Special Requests NONE  Final   Culture NO GROWTH Performed at [facility]   Final   Report Status 09/09/2016 FINAL  Final  MRSA PCR Screening     Status: None   Collection Time: 09/08/16 10:21 PM  Result Value Ref Range Status   MRSA by PCR NEGATIVE NEGATIVE Final    Comment:        The GeneXpert MRSA Assay (FDA approved for NASAL specimens only), is one component of a comprehensive MRSA colonization surveillance program. It is not intended to diagnose MRSA infection nor to guide or monitor treatment for MRSA infections.   Respiratory Panel by PCR     Status: None   Collection Time: 09/09/16  9:43 AM  Result Value Ref Range Status   Adenovirus NOT DETECTED NOT DETECTED Final   Coronavirus 229E NOT DETECTED NOT DETECTED Final   Coronavirus HKU1 NOT DETECTED NOT DETECTED Final   Coronavirus NL63 NOT DETECTED NOT DETECTED Final   Coronavirus OC43 NOT DETECTED NOT DETECTED Final   Metapneumovirus NOT DETECTED NOT DETECTED Final   Rhinovirus / Enterovirus NOT DETECTED NOT DETECTED Final   Influenza A NOT DETECTED NOT DETECTED Final   Influenza B NOT DETECTED NOT DETECTED Final   Parainfluenza Virus 1 NOT DETECTED NOT DETECTED Final   Parainfluenza Virus 2 NOT DETECTED NOT DETECTED Final   Parainfluenza Virus 3 NOT DETECTED NOT DETECTED Final   Parainfluenza Virus 4 NOT DETECTED NOT DETECTED Final   Respiratory Syncytial Virus NOT DETECTED NOT DETECTED Final   Bordetella pertussis NOT DETECTED NOT DETECTED Final   Chlamydophila pneumoniae NOT DETECTED  NOT DETECTED Final   Mycoplasma pneumoniae NOT DETECTED NOT DETECTED Final    Comment: Performed at [facility]  Culture, blood (routine x 2)     Status: Abnormal   Collection Time: 09/10/16 10:29 AM  Result Value Ref Range Status   Specimen Description BLOOD RIGHT HAND  Final   Special Requests IN PEDIATRIC BOTTLE 4CC  Final   Culture  Setup Time   Final    GRAM POSITIVE COCCI IN CLUSTERS IN PEDIATRIC BOTTLE CRITICAL RESULT CALLED TO, READ BACK BY AND VERIFIED WITH: Chales Abrahams, [place] AT [place] ON [id number] BY Rhea Bleacher    Culture (A)  Final    STAPHYLOCOCCUS SPECIES (COAGULASE NEGATIVE) SUSCEPTIBILITIES REQUESTED BY PHARMACY Performed at [facility]    Report Status 09/14/2016 FINAL  Final   Organism ID, Bacteria STAPHYLOCOCCUS SPECIES (COAGULASE NEGATIVE)  Final      Susceptibility   Staphylococcus species (coagulase negative) - MIC*    CIPROFLOXACIN >=8 RESISTANT Resistant     ERYTHROMYCIN >=8 RESISTANT Resistant     GENTAMICIN <=0.5 SENSITIVE Sensitive  OXACILLIN 0.5 RESISTANT Resistant     TETRACYCLINE <=1 SENSITIVE Sensitive     VANCOMYCIN 1 SENSITIVE Sensitive     TRIMETH/SULFA <=10 SENSITIVE Sensitive     CLINDAMYCIN <=0.25 RESISTANT Resistant     RIFAMPIN <=0.5 SENSITIVE Sensitive     Inducible Clindamycin POSITIVE Resistant     * STAPHYLOCOCCUS SPECIES (COAGULASE NEGATIVE)  Culture, blood (routine x 2)     Status: None   Collection Time: 09/10/16 10:37 AM  Result Value Ref Range Status   Specimen Description BLOOD LEFT HAND  Final   Special Requests BOTTLES DRAWN AEROBIC ONLY 5CC  Final   Culture   Final    NO GROWTH 5 DAYS Performed at [facility]    Report Status 09/15/2016 FINAL  Final  Respiratory Panel by PCR     Status: None   Collection Time: 09/13/16  8:14 AM  Result Value Ref Range Status   Adenovirus NOT DETECTED NOT DETECTED Final   Coronavirus 229E NOT DETECTED NOT DETECTED Final   Coronavirus HKU1 NOT DETECTED NOT DETECTED  Final   Coronavirus NL63 NOT DETECTED NOT DETECTED Final   Coronavirus OC43 NOT DETECTED NOT DETECTED Final   Metapneumovirus NOT DETECTED NOT DETECTED Final   Rhinovirus / Enterovirus NOT DETECTED NOT DETECTED Final   Influenza A NOT DETECTED NOT DETECTED Final   Influenza B NOT DETECTED NOT DETECTED Final   Parainfluenza Virus 1 NOT DETECTED NOT DETECTED Final   Parainfluenza Virus 2 NOT DETECTED NOT DETECTED Final   Parainfluenza Virus 3 NOT DETECTED NOT DETECTED Final   Parainfluenza Virus 4 NOT DETECTED NOT DETECTED Final   Respiratory Syncytial Virus NOT DETECTED NOT DETECTED Final   Bordetella pertussis NOT DETECTED NOT DETECTED Final   Chlamydophila pneumoniae NOT DETECTED NOT DETECTED Final   Mycoplasma pneumoniae NOT DETECTED NOT DETECTED Final    Comment: Performed at [facility]  Culture, blood (Routine X 2) w Reflex to ID Panel     Status: None (Preliminary result)   Collection Time: 09/16/16  1:22 PM  Result Value Ref Range Status   Specimen Description BLOOD RIGHT HAND  Final   Special Requests BOTTLES DRAWN AEROBIC AND ANAEROBIC  5 CC EACH  Final   Culture   Final    NO GROWTH < 24 HOURS Performed at [facility]    Report Status PENDING  Incomplete     Labs: BNP (last 3 results) No results for input(s): BNP in the last 8760 hours. Basic Metabolic Panel:  Recent Labs Lab 09/12/16 1353 09/14/16 0426 09/18/16 0413  NA 138 141 148*  K 3.3* 4.1 5.2*  CL 107 98* 101  CO2 24 30 29   GLUCOSE 119* 149* 137*  BUN 13 20 76*  CREATININE 0.55 0.70 1.70*  CALCIUM 8.2* 8.2* 8.8*   Liver Function Tests:  Recent Labs Lab 09/12/16 1353  AST 98*  ALT 43  ALKPHOS 394*  BILITOT 1.5*  PROT 6.7  ALBUMIN 1.8*    Recent Labs Lab 09/14/16 1314  LIPASE 48  AMYLASE 44   No results for input(s): AMMONIA in the last 168 hours. CBC:  Recent Labs Lab 09/12/16 0515 09/12/16 1111 09/13/16 0514 09/14/16 0426 09/18/16 0413  WBC 16.2* 14.1* 15.7*  16.8* 23.7*  HGB 10.4* 10.0* 10.6* 10.8* 11.3*  HCT 32.2* 30.1* 32.2* 32.8* 36.1  MCV 101.3* 101.3* 100.9* 100.9* 106.5*  PLT 297 292 300 317 298   Cardiac Enzymes: No results for input(s): CKTOTAL, CKMB, CKMBINDEX, TROPONINI in  the last 168 hours. BNP: Invalid input(s): POCBNP CBG: No results for input(s): GLUCAP in the last 168 hours. D-Dimer No results for input(s): DDIMER in the last 72 hours. Hgb A1c No results for input(s): HGBA1C in the last 72 hours. Lipid Profile No results for input(s): CHOL, HDL, LDLCALC, TRIG, CHOLHDL, LDLDIRECT in the last 72 hours. Thyroid function studies No results for input(s): TSH, T4TOTAL, T3FREE, THYROIDAB in the last 72 hours.  Invalid input(s): FREET3 Anemia work up No results for input(s): VITAMINB12, FOLATE, FERRITIN, TIBC, IRON, RETICCTPCT in the last 72 hours. Urinalysis    Component Value Date/Time   COLORURINE YELLOW 09/08/2016 1500   APPEARANCEUR CLEAR 09/08/2016 1500   LABSPEC 1.016 09/08/2016 1500   PHURINE 5.5 09/08/2016 1500   GLUCOSEU NEGATIVE 09/08/2016 1500   HGBUR SMALL (A) 09/08/2016 1500   BILIRUBINUR NEGATIVE 09/08/2016 1500   KETONESUR NEGATIVE 09/08/2016 1500   PROTEINUR NEGATIVE 09/08/2016 1500   UROBILINOGEN 1.0 11/02/2014 0924   NITRITE NEGATIVE 09/08/2016 1500   LEUKOCYTESUR TRACE (A) 09/08/2016 1500   Sepsis Labs Invalid input(s): PROCALCITONIN,  WBC,  LACTICIDVEN Microbiology Recent Results (from the past 240 hour(s))  Culture, blood (Routine x 2)     Status: None   Collection Time: 09/08/16  1:06 PM  Result Value Ref Range Status   Specimen Description BLOOD LEFT HAND  Final   Special Requests BOTTLES DRAWN AEROBIC AND ANAEROBIC 5CC  Final   Culture   Final    NO GROWTH 5 DAYS Performed at [facility]    Report Status 09/13/2016 FINAL  Final  Culture, blood (Routine x 2)     Status: Abnormal   Collection Time: 09/08/16  1:09 PM  Result Value Ref Range Status   Specimen Description BLOOD  RIGHT ANTECUBITAL  Final   Special Requests BOTTLES DRAWN AEROBIC AND ANAEROBIC 5CC  Final   Culture  Setup Time   Final    GRAM POSITIVE COCCI IN CLUSTERS IN BOTH AEROBIC AND ANAEROBIC BOTTLES CRITICAL RESULT CALLED TO, READ BACK BY AND VERIFIED WITH: N GLOGOVAC,PHARMD AT 1253 09/09/16 BY L BENFIELD    Culture (A)  Final    STAPHYLOCOCCUS SPECIES (COAGULASE NEGATIVE) THE SIGNIFICANCE OF ISOLATING THIS ORGANISM FROM A SINGLE SET OF BLOOD CULTURES WHEN MULTIPLE SETS ARE DRAWN IS UNCERTAIN. PLEASE NOTIFY THE MICROBIOLOGY DEPARTMENT WITHIN ONE WEEK IF SPECIATION AND SENSITIVITIES ARE REQUIRED. Performed at [facility]    Report Status 09/11/2016 FINAL  Final  Blood Culture ID Panel (Reflexed)     Status: Abnormal   Collection Time: 09/08/16  1:09 PM  Result Value Ref Range Status   Enterococcus species NOT DETECTED NOT DETECTED Final   Listeria monocytogenes NOT DETECTED NOT DETECTED Final   Staphylococcus species DETECTED (A) NOT DETECTED Final    Comment: CRITICAL RESULT CALLED TO, READ BACK BY AND VERIFIED WITH: N GLOGOVAC,PHARMD AT 1253 09/09/16 BY L BENFIELD    Staphylococcus aureus NOT DETECTED NOT DETECTED Final   Methicillin resistance NOT DETECTED NOT DETECTED Final   Streptococcus species NOT DETECTED NOT DETECTED Final   Streptococcus agalactiae NOT DETECTED NOT DETECTED Final   Streptococcus pneumoniae NOT DETECTED NOT DETECTED Final   Streptococcus pyogenes NOT DETECTED NOT DETECTED Final   Acinetobacter baumannii NOT DETECTED NOT DETECTED Final   Enterobacteriaceae species NOT DETECTED NOT DETECTED Final   Enterobacter cloacae complex NOT DETECTED NOT DETECTED Final   Escherichia coli NOT DETECTED NOT DETECTED Final   Klebsiella oxytoca NOT DETECTED NOT DETECTED Final  Klebsiella pneumoniae NOT DETECTED NOT DETECTED Final   Proteus species NOT DETECTED NOT DETECTED Final   Serratia marcescens NOT DETECTED NOT DETECTED Final   Haemophilus influenzae NOT DETECTED  NOT DETECTED Final   Neisseria meningitidis NOT DETECTED NOT DETECTED Final   Pseudomonas aeruginosa NOT DETECTED NOT DETECTED Final   Candida albicans NOT DETECTED NOT DETECTED Final   Candida glabrata NOT DETECTED NOT DETECTED Final   Candida krusei NOT DETECTED NOT DETECTED Final   Candida parapsilosis NOT DETECTED NOT DETECTED Final   Candida tropicalis NOT DETECTED NOT DETECTED Final    Comment: Performed at [facility]  Urine culture     Status: None   Collection Time: 09/08/16  3:00 PM  Result Value Ref Range Status   Specimen Description URINE, CATHETERIZED  Final   Special Requests NONE  Final   Culture NO GROWTH Performed at [facility]   Final   Report Status 09/09/2016 FINAL  Final  MRSA PCR Screening     Status: None   Collection Time: 09/08/16 10:21 PM  Result Value Ref Range Status   MRSA by PCR NEGATIVE NEGATIVE Final    Comment:        The GeneXpert MRSA Assay (FDA approved for NASAL specimens only), is one component of a comprehensive MRSA colonization surveillance program. It is not intended to diagnose MRSA infection nor to guide or monitor treatment for MRSA infections.   Respiratory Panel by PCR     Status: None   Collection Time: 09/09/16  9:43 AM  Result Value Ref Range Status   Adenovirus NOT DETECTED NOT DETECTED Final   Coronavirus 229E NOT DETECTED NOT DETECTED Final   Coronavirus HKU1 NOT DETECTED NOT DETECTED Final   Coronavirus NL63 NOT DETECTED NOT DETECTED Final   Coronavirus OC43 NOT DETECTED NOT DETECTED Final   Metapneumovirus NOT DETECTED NOT DETECTED Final   Rhinovirus / Enterovirus NOT DETECTED NOT DETECTED Final   Influenza A NOT DETECTED NOT DETECTED Final   Influenza B NOT DETECTED NOT DETECTED Final   Parainfluenza Virus 1 NOT DETECTED NOT DETECTED Final   Parainfluenza Virus 2 NOT DETECTED NOT DETECTED Final   Parainfluenza Virus 3 NOT DETECTED NOT DETECTED Final   Parainfluenza Virus 4 NOT DETECTED NOT  DETECTED Final   Respiratory Syncytial Virus NOT DETECTED NOT DETECTED Final   Bordetella pertussis NOT DETECTED NOT DETECTED Final   Chlamydophila pneumoniae NOT DETECTED NOT DETECTED Final   Mycoplasma pneumoniae NOT DETECTED NOT DETECTED Final    Comment: Performed at [facility]  Culture, blood (routine x 2)     Status: Abnormal   Collection Time: 09/10/16 10:29 AM  Result Value Ref Range Status   Specimen Description BLOOD RIGHT HAND  Final   Special Requests IN PEDIATRIC BOTTLE 4CC  Final   Culture  Setup Time   Final    GRAM POSITIVE COCCI IN CLUSTERS IN PEDIATRIC BOTTLE CRITICAL RESULT CALLED TO, READ BACK BY AND VERIFIED WITH: Chales Abrahams, [place] AT [place] ON [id number] BY Rhea Bleacher    Culture (A)  Final    STAPHYLOCOCCUS SPECIES (COAGULASE NEGATIVE) SUSCEPTIBILITIES REQUESTED BY PHARMACY Performed at [facility]    Report Status 09/14/2016 FINAL  Final   Organism ID, Bacteria STAPHYLOCOCCUS SPECIES (COAGULASE NEGATIVE)  Final      Susceptibility   Staphylococcus species (coagulase negative) - MIC*    CIPROFLOXACIN >=8 RESISTANT Resistant     ERYTHROMYCIN >=8 RESISTANT Resistant     GENTAMICIN <=0.5  SENSITIVE Sensitive     OXACILLIN 0.5 RESISTANT Resistant     TETRACYCLINE <=1 SENSITIVE Sensitive     VANCOMYCIN 1 SENSITIVE Sensitive     TRIMETH/SULFA <=10 SENSITIVE Sensitive     CLINDAMYCIN <=0.25 RESISTANT Resistant     RIFAMPIN <=0.5 SENSITIVE Sensitive     Inducible Clindamycin POSITIVE Resistant     * STAPHYLOCOCCUS SPECIES (COAGULASE NEGATIVE)  Culture, blood (routine x 2)     Status: None   Collection Time: 09/10/16 10:37 AM  Result Value Ref Range Status   Specimen Description BLOOD LEFT HAND  Final   Special Requests BOTTLES DRAWN AEROBIC ONLY 5CC  Final   Culture   Final    NO GROWTH 5 DAYS Performed at [facility]    Report Status 09/15/2016 FINAL  Final  Respiratory Panel by PCR     Status: None   Collection Time: 09/13/16  8:14 AM   Result Value Ref Range Status   Adenovirus NOT DETECTED NOT DETECTED Final   Coronavirus 229E NOT DETECTED NOT DETECTED Final   Coronavirus HKU1 NOT DETECTED NOT DETECTED Final   Coronavirus NL63 NOT DETECTED NOT DETECTED Final   Coronavirus OC43 NOT DETECTED NOT DETECTED Final   Metapneumovirus NOT DETECTED NOT DETECTED Final   Rhinovirus / Enterovirus NOT DETECTED NOT DETECTED Final   Influenza A NOT DETECTED NOT DETECTED Final   Influenza B NOT DETECTED NOT DETECTED Final   Parainfluenza Virus 1 NOT DETECTED NOT DETECTED Final   Parainfluenza Virus 2 NOT DETECTED NOT DETECTED Final   Parainfluenza Virus 3 NOT DETECTED NOT DETECTED Final   Parainfluenza Virus 4 NOT DETECTED NOT DETECTED Final   Respiratory Syncytial Virus NOT DETECTED NOT DETECTED Final   Bordetella pertussis NOT DETECTED NOT DETECTED Final   Chlamydophila pneumoniae NOT DETECTED NOT DETECTED Final   Mycoplasma pneumoniae NOT DETECTED NOT DETECTED Final    Comment: Performed at [facility]  Culture, blood (Routine X 2) w Reflex to ID Panel     Status: None (Preliminary result)   Collection Time: 09/16/16  1:22 PM  Result Value Ref Range Status   Specimen Description BLOOD RIGHT HAND  Final   Special Requests BOTTLES DRAWN AEROBIC AND ANAEROBIC  5 CC EACH  Final   Culture   Final    NO GROWTH < 24 HOURS Performed at [facility]    Report Status PENDING  Incomplete     Time coordinating discharge: Over 30 minutes  SIGNED:   Louellen Molder, MD  [facility] 09/18/2016, 11:35 AM Pager   If 7PM-7AM, please contact night-coverage [email or web address] Password [email or web address]

## 2016-09-18 NOTE — Progress Notes (Signed)
: Provider:  Noah Delaine. Sheppard Coil, MD Location:  [place] Room Number: [phone] Place of Service:  SNF ([phone])  PCP: Inocencio Homes, MD Patient Care Team: Hennie Duos, MD as PCP - General (Internal Medicine)  Extended Emergency Contact Information Primary Emergency Contact: Sellars,[name] Address: [address]          [place] [address] [place] Phone: [phone] Relation: Sister Secondary Emergency Contact: Bahja Cower [place] Phone: [phone] Relation: None     Allergies: Carvedilol and Ppd [tuberculin purified protein derivative]  Chief Complaint  Patient presents with  . New Admit To SNF    Admit to Facility    HPI: Patient is 70 y.o. female with MR, seizure disorder, dementia, A. fib, hypothyroidism who was sent from SNF with fever. She was hospitalized in August this year with aspiration pneumonia. Patient septic in the ED with hypotension, tachycardia, tachypnea and fever of 103.3F. Blood work showed WBC of 20 7.8K, lactic acid of 8.3 and potassium of 5.5. She was started on empiric vancomycin and Zosyn. Pt was admitted to [facility] from 11/25-12/5.Hospital course prolonged due to persistent sepsis without clear underlying cause, despite a long course of broad spectrum IV .Given her poor overall prognosis and lack of improvement despite multiple tests and aggressive treatment pt's sister finally  agrees with patient returning back to skilled nursing facility with hospice for full comfort. It is noted that sister still wants pt to receive tube feeding.  Past Medical History:  Diagnosis Date  . Anemia   . Anginal pain ([place])   . Cataract   . Cataracts, bilateral   . CHF (congestive heart failure) ([place])   . Coronary artery disease   . Dementia   . Dental caries   . Dysphagia   . Esophageal reflux   . High cholesterol   . Hyperlipidemia   . Hypotension   . Hypothyroidism   . Optic atrophy     . Optic atrophy   . Osteoporosis   . 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 Paroxysmal atrial fibrillation (HCC)   . Seizures ([place])   . Sepsis ([place]) 01/2016  . Thyroid disease    hypothyroid  . Unspecified convulsions ([place])     Past Surgical History:  Procedure Laterality Date  . CENTRAL VENOUS CATHETER INSERTION  05/15/2014      . ESOPHAGOGASTRODUODENOSCOPY N/A 06/03/2014   Procedure: ESOPHAGOGASTRODUODENOSCOPY (EGD);  Surgeon: Beryle Beams, MD;  Location: [facility] ENDOSCOPY;  Service: Endoscopy;  Laterality: N/A;  . GASTROSTOMY TUBE PLACEMENT    . midline incision        Medication List       Accurate as of 09/18/16 11:59 PM. Always use your most recent med list.          acetaminophen 650 MG suppository Commonly known as:  TYLENOL Place 1 suppository (650 mg total) rectally every 6 (six) hours as needed for mild pain (or Fever >/= 101).   clonazePAM 1 MG tablet Commonly known as:  KLONOPIN Place 1 mg into feeding tube every 8 (eight) hours.   docusate 50 MG/5ML liquid Commonly known as:  COLACE Place 20 mLs (200 mg total) into feeding tube 2 (two) times daily.   feeding supplement (PRO-STAT SUGAR FREE 64) Liqd Place 30 mLs into feeding tube daily.   free water Soln Place 100 mLs into feeding tube every 4 (four) hours.   furosemide 20 MG tablet Commonly  known as:  LASIX 2 tablets (40 mg total) by Per J Tube route 2 (two) times daily.   levETIRAcetam 100 MG/ML solution Commonly known as:  KEPPRA Place 5 mLs (500 mg total) into feeding tube daily at 6 (six) AM.   levothyroxine 100 MCG tablet Commonly known as:  SYNTHROID, LEVOTHROID 100 mcg by PEG Tube route daily before breakfast.   NUTRITIONAL SUPPLEMENT PO Two cal hn via peg at 39 cc/hr x 24 hours continuously   Valproic Acid 250 MG/5ML Syrp syrup Commonly known as:  DEPAKENE Place 15 mLs (750 mg total) into feeding tube 3 (three) times daily.       No orders of the defined types were placed in this  encounter.   Immunization History  Administered Date(s) Administered  . Influenza-Unspecified 08/13/2014, 07/11/2015, 07/13/2016    Social History  Substance Use Topics  . Smoking status: Never Smoker  . Smokeless tobacco: Never Used  . Alcohol use No    Family history is   Family History  Problem Relation Age of Onset  . Colon cancer Mother   . Diabetes type II Sister       Review of Systems - UTO 2/2 vegetative state; nursing without concerns   Vitals:   09/18/16 1617  BP: 94/70  Pulse: (!) 128  Resp: (!) 28  Temp: (!) 102.4 F (39.1 C)    SpO2 Readings from Last 1 Encounters:  09/18/16 97%   Body mass index is 29.15 kg/m.     Physical Exam  GENERAL APPEARANCE: vegetative, NAD SKIN: No diaphoresis rash HEAD: Normocephalic, atraumatic  EYES: Conjunctiva/lids clear[. EOM wandering EARS: External exam WNL, canals clear; does not respond to sound NOSE: No deformity or discharge.  MOUTH/THROAT: Lips w/o lesions  RESPIRATORY: Breathing is even, unlabored. Lung sounds are clear   CARDIOVASCULAR: Heart RRR no murmurs, rubs or gallops. No peripheral edema.   GASTROINTESTINAL: Abdomen is soft, non-tender, not distended w/ normal bowel sounds.PEG with food running GENITOURINARY: Bladder non tender, not distended  MUSCULOSKELETAL: contractures and wasting NEUROLOGIC: vegetative PSYCHIATRIC: n/a  Patient Active Problem List   Diagnosis Date Noted  . Severe sepsis with septic shock ([place]) 09/18/2016  . Mental retardation 09/18/2016  . Acute kidney injury ([place]) 09/18/2016  . Seizures ([place]) 09/18/2016  . Dementia 09/18/2016  . 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 At risk for shortness of breath   . Acute pulmonary edema (HCC)   . Acute respiratory failure with hypoxia ([place])   . Leukocytosis 07/26/2014  . PEG (percutaneous endoscopic gastrostomy) status ([place]) 06/12/2014  . Seizure disorder ([place]) 10/28/2013  . Hypothyroidism 10/28/2013      Labs  reviewed: Basic Metabolic Panel:    Component Value Date/Time   NA 148 (H) 09/18/2016 0413   NA 136 (A) 06/13/2016   NA 136 (A) 06/13/2016   K 5.2 (H) 09/18/2016 0413   CL 101 09/18/2016 0413   CO2 29 09/18/2016 0413   GLUCOSE 137 (H) 09/18/2016 0413   BUN 76 (H) 09/18/2016 0413   BUN 11 06/13/2016   BUN 11 06/13/2016   CREATININE 1.70 (H) 09/18/2016 0413   CALCIUM 8.8 (L) 09/18/2016 0413   PROT 6.7 09/12/2016 1353   ALBUMIN 1.8 (L) 09/12/2016 1353   AST 98 (H) 09/12/2016 1353   ALT 43 09/12/2016 1353   ALKPHOS 394 (H) 09/12/2016 1353   BILITOT 1.5 (H) 09/12/2016 1353   GFRNONAA 29 (L) 09/18/2016 0413   GFRAA 34 (L) 09/18/2016 0413     Recent  Labs  02/12/16 1109 02/12/16 1542  09/12/16 1353 09/14/16 0426 09/18/16 0413  NA  --   --   < > 138 141 148*  K  --  3.6  < > 3.3* 4.1 5.2*  CL  --   --   < > 107 98* 101  CO2  --   --   < > 24 30 29   GLUCOSE  --   --   < > 119* 149* 137*  BUN  --   --   < > 13 20 76*  CREATININE  --   --   < > 0.55 0.70 1.70*  CALCIUM  --   --   < > 8.2* 8.2* 8.8*  MG 2.0 2.0  --   --   --   --   < > = values in this interval not displayed. Liver Function Tests:  Recent Labs  06/04/16 0657 09/08/16 1309 09/12/16 1353  AST 30 161* 98*  ALT 18 54 43  ALKPHOS 91 347* 394*  BILITOT 0.5 2.1* 1.5*  PROT 7.4 7.6 6.7  ALBUMIN 2.5* 2.1* 1.8*    Recent Labs  09/14/16 1314  LIPASE 48  AMYLASE 44   No results for input(s): AMMONIA in the last 8760 hours. CBC:  Recent Labs  06/03/16 2204  06/04/16 0657  09/08/16 1309  09/13/16 0514 09/14/16 0426 09/18/16 0413  WBC 35.0*  < > 42.4*  < > 27.8*  < > 15.7* 16.8* 23.7*  NEUTROABS 31.8*  --  37.4*  --  20.0*  --   --   --   --   HGB 14.8  < > 12.9  < > 14.1  < > 10.6* 10.8* 11.3*  HCT 45.5  < > 39.5  < > 41.3  < > 32.2* 32.8* 36.1  MCV 94.0  --  93.6  < > 99.5  < > 100.9* 100.9* 106.5*  PLT 297  < > 275  < > 433*  < > 300 317 298  < > = values in this interval not  displayed. Lipid No results for input(s): CHOL, HDL, LDLCALC, TRIG in the last 8760 hours.  Cardiac Enzymes: No results for input(s): CKTOTAL, CKMB, CKMBINDEX, TROPONINI in the last 8760 hours. BNP: No results for input(s): BNP in the last 8760 hours. No results found for: MICROALBUR No results found for: HGBA1C Lab Results  Component Value Date   TSH 3.04 12/27/2014   Lab Results  Component Value Date   [id number] (H) 11/03/2014   Lab Results  Component Value Date   FOLATE 8.5 05/15/2014   Lab Results  Component Value Date   IRON 18 (L) 11/03/2014   TIBC 179 (L) 11/03/2014   FERRITIN 935 (H) 11/03/2014    Imaging and Procedures obtained prior to SNF admission: No results found.   Not all labs, radiology exams or other studies done during hospitalization come through on my EPIC note; however they are reviewed by me.    Assessment and Plan  SEVERE SEPSIS -Source unclear. Suspect coag negative staph bacteremia. However she continues to be febrile and septic despite restarting IV vancomycin for last 48 hours. Repeat blood cultures from 12/3 have been negative. Full infectious workup including: Respiratory viral panel , 2-D echo for vegetation,. CT of the chest abdomen and pelvis ultrasound abdomen for source of infection have been negative. No skin wound are ulcers. No joint swelling or tenderness. -doppler LE neg for DVT. CT angio chest neg for PE.  CT head shows mastoid effusion. -Discussed again with ID. Given lack of improvement despite resuming IV vancomycin we have decided to discontinue antibiotics SNF - will consult Hospice in am  ACUTE RESP FAILURE WITH HYPOXIA 2/2 ACUTE PULM EDEMA -Secondary to volume overload. Received IV Lasix for diuresis. 2-D echo with EF 65-70% and no wall motion abnormality. Continue Foley upon discharge snf - D/C WITH LASIX 40 MG bid; WILL CONT  SEIZURE DISORDER SNF - stable; cont Keppra 500 mg daily and depakote 750 mg  TID  HYPOTHYROIDISM SNF - cont synthroid 100 mcg  AKI- Likely secondary to diuresis, ongoing sepsis and? Vancomycin toxicity. No further lab draws. Continue Lasix for symptomatic management. SNF - cont no further lab draws  NEUROLOGIC MOVEMENT DISORDER  SNF - cont klonopin 1 mg per tube q 8    Time spent > 45 min;> 50% of time with patient was spent reviewing records, labs, tests and studies, counseling and developing plan of care  Webb Silversmith D. Sheppard Coil, MD

## 2016-09-18 NOTE — Progress Notes (Signed)
Patient is set to discharge back to [facility] today. Patient & sister, Bertram Millard made aware. Discharge packet given to RN, Geni Bers. [facility] called for transport.     Raynaldo Opitz, [facility] Clinical Social Worker cell #: [phone]

## 2016-09-19 ENCOUNTER — Encounter: Payer: Self-pay | Admitting: Internal Medicine

## 2016-09-19 ENCOUNTER — Telehealth: Payer: Self-pay

## 2016-09-19 NOTE — Telephone Encounter (Signed)
Re-admission to facility. This is a patient you were seeing at [facility]. [facility] F/U was completed 09/18/16. Hospital discharge from [place] on 09/18/16.

## 2016-09-19 NOTE — Progress Notes (Signed)
Opened in error; Disregard.

## 2016-09-21 LAB — CULTURE, BLOOD (ROUTINE X 2): CULTURE: NO GROWTH

## 2016-09-27 ENCOUNTER — Non-Acute Institutional Stay (SKILLED_NURSING_FACILITY): Payer: Medicare Other | Admitting: Internal Medicine

## 2016-09-27 DIAGNOSIS — Z515 Encounter for palliative care: Secondary | ICD-10-CM | POA: Diagnosis not present

## 2016-09-28 ENCOUNTER — Encounter: Payer: Self-pay | Admitting: Internal Medicine

## 2016-09-28 ENCOUNTER — Non-Acute Institutional Stay (SKILLED_NURSING_FACILITY): Payer: Medicare Other | Admitting: Internal Medicine

## 2016-09-28 DIAGNOSIS — Z515 Encounter for palliative care: Secondary | ICD-10-CM

## 2016-09-28 DIAGNOSIS — Z931 Gastrostomy status: Secondary | ICD-10-CM | POA: Diagnosis not present

## 2016-09-28 NOTE — Progress Notes (Signed)
Location:  [facility] Room Number: [phone] Place of Service:  SNF [phone])  Inocencio Homes, MD  Patient Care Team: Hennie Duos, MD as PCP - General (Internal Medicine)  Extended Emergency Contact Information Primary Emergency Contact: Sellars,[name] Address: [address]          [place] [address] [place] Phone: [phone] Relation: Sister Secondary Emergency Contact: Shereka Cower [place] Phone: [phone] Relation: None    Allergies: Carvedilol and Ppd [tuberculin purified protein derivative]  Chief Complaint  Patient presents with  . Acute Visit    Acute    HPI: Patient is 70 y.o. female who   Past Medical History:  Diagnosis Date  . Anemia   . Anginal pain ([place])   . Cataract   . Cataracts, bilateral   . CHF (congestive heart failure) ([place])   . Coronary artery disease   . Dementia   . Dental caries   . Dysphagia   . Esophageal reflux   . High cholesterol   . Hyperlipidemia   . Hypotension   . Hypothyroidism   . Optic atrophy   . Optic atrophy   . Osteoporosis   . 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 Paroxysmal atrial fibrillation (HCC)   . Seizures ([place])   . Sepsis ([place]) 01/2016  . Thyroid disease    hypothyroid  . Unspecified convulsions ([place])     Past Surgical History:  Procedure Laterality Date  . CENTRAL VENOUS CATHETER INSERTION  05/15/2014      . ESOPHAGOGASTRODUODENOSCOPY N/A 06/03/2014   Procedure: ESOPHAGOGASTRODUODENOSCOPY (EGD);  Surgeon: Beryle Beams, MD;  Location: [facility] ENDOSCOPY;  Service: Endoscopy;  Laterality: N/A;  . GASTROSTOMY TUBE PLACEMENT    . midline incision      Allergies as of 09/28/2016      Reactions   Carvedilol    Ppd [tuberculin Purified Protein Derivative]    Per [facility]      Medication List       Accurate as of 09/28/16 10:11 AM. Always use your most recent med list.          acetaminophen 650 MG suppository Commonly known as:   TYLENOL Place 1 suppository (650 mg total) rectally every 6 (six) hours as needed for mild pain (or Fever >/= 101).   clonazePAM 1 MG tablet Commonly known as:  KLONOPIN Place 1 mg into feeding tube every 8 (eight) hours.   docusate 50 MG/5ML liquid Commonly known as:  COLACE Place 20 mLs (200 mg total) into feeding tube 2 (two) times daily.   feeding supplement (PRO-STAT SUGAR FREE 64) Liqd Place 30 mLs into feeding tube daily.   free water Soln Place 100 mLs into feeding tube every 4 (four) hours.   furosemide 20 MG tablet Commonly known as:  LASIX 2 tablets (40 mg total) by Per J Tube route 2 (two) times daily.   levETIRAcetam 100 MG/ML solution Commonly known as:  KEPPRA Place 5 mLs (500 mg total) into feeding tube daily at 6 (six) AM.   levothyroxine 100 MCG tablet Commonly known as:  SYNTHROID, LEVOTHROID 100 mcg by PEG Tube route daily before breakfast.   NUTRITIONAL SUPPLEMENT PO Two cal hn via peg at 39 cc/hr x 24 hours continuously   Valproic Acid 250 MG/5ML Syrp syrup Commonly known as:  DEPAKENE Place 15 mLs (750 mg total) into feeding tube 3 (three) times daily.  No orders of the defined types were placed in this encounter.   Immunization History  Administered Date(s) Administered  . Influenza-Unspecified 08/13/2014, 07/11/2015, 07/13/2016    Social History  Substance Use Topics  . Smoking status: Never Smoker  . Smokeless tobacco: Never Used  . Alcohol use No    Review of Systems  DATA OBTAINED: from patient, nurse, medical record, family member GENERAL:  no fevers, fatigue, appetite changes SKIN: No itching, rash HEENT: No complaint RESPIRATORY: No cough, wheezing, SOB CARDIAC: No chest pain, palpitations, lower extremity edema  GI: No abdominal pain, No N/V/D or constipation, No heartburn or reflux  GU: No dysuria, frequency or urgency, or incontinence  MUSCULOSKELETAL: No unrelieved bone/joint pain NEUROLOGIC: No headache,  dizziness  PSYCHIATRIC: No overt anxiety or sadness  Vitals:   09/27/16 0957  BP: 131/75  Pulse: 92  Resp: 18  Temp: 97.9 F (36.6 C)   Body mass index is 28.63 kg/m. Physical Exam  GENERAL APPEARANCE: Alert, conversant, No acute distress  SKIN: No diaphoresis rash HEENT: Unremarkable RESPIRATORY: Breathing is even, unlabored. Lung sounds are clear   CARDIOVASCULAR: Heart RRR no murmurs, rubs or gallops. No peripheral edema  GASTROINTESTINAL: Abdomen is soft, non-tender, not distended w/ normal bowel sounds.  GENITOURINARY: Bladder non tender, not distended  MUSCULOSKELETAL: No abnormal joints or musculature NEUROLOGIC: Cranial nerves 2-12 grossly intact. Moves all extremities PSYCHIATRIC: Mood and affect appropriate to situation, no behavioral issues  Patient Active Problem List   Diagnosis Date Noted  . Severe sepsis with septic shock ([place]) 09/18/2016  . Mental retardation 09/18/2016  . Acute kidney injury ([place]) 09/18/2016  . Seizures ([place]) 09/18/2016  . Dementia 09/18/2016  . 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 At risk for shortness of breath   . Acute pulmonary edema (HCC)   . Acute respiratory failure with hypoxia ([place])   . Leukocytosis 07/26/2014  . PEG (percutaneous endoscopic gastrostomy) status ([place]) 06/12/2014  . Seizure disorder ([place]) 10/28/2013  . Hypothyroidism 10/28/2013    CMP     Component Value Date/Time   NA 148 (H) 09/18/2016 0413   NA 136 (A) 06/13/2016   NA 136 (A) 06/13/2016   K 5.2 (H) 09/18/2016 0413   CL 101 09/18/2016 0413   CO2 29 09/18/2016 0413   GLUCOSE 137 (H) 09/18/2016 0413   BUN 76 (H) 09/18/2016 0413   BUN 11 06/13/2016   BUN 11 06/13/2016   CREATININE 1.70 (H) 09/18/2016 0413   CALCIUM 8.8 (L) 09/18/2016 0413   PROT 6.7 09/12/2016 1353   ALBUMIN 1.8 (L) 09/12/2016 1353   AST 98 (H) 09/12/2016 1353   ALT 43 09/12/2016 1353   ALKPHOS 394 (H) 09/12/2016 1353   BILITOT 1.5 (H) 09/12/2016 1353   GFRNONAA 29 (L)  09/18/2016 0413   GFRAA 34 (L) 09/18/2016 0413    Recent Labs  02/12/16 1109 02/12/16 1542  09/12/16 1353 09/14/16 0426 09/18/16 0413  NA  --   --   < > 138 141 148*  K  --  3.6  < > 3.3* 4.1 5.2*  CL  --   --   < > 107 98* 101  CO2  --   --   < > 24 30 29   GLUCOSE  --   --   < > 119* 149* 137*  BUN  --   --   < > 13 20 76*  CREATININE  --   --   < > 0.55 0.70 1.70*  CALCIUM  --   --   < >  8.2* 8.2* 8.8*  MG 2.0 2.0  --   --   --   --   < > = values in this interval not displayed.  Recent Labs  06/04/16 0657 09/08/16 1309 09/12/16 1353  AST 30 161* 98*  ALT 18 54 43  ALKPHOS 91 347* 394*  BILITOT 0.5 2.1* 1.5*  PROT 7.4 7.6 6.7  ALBUMIN 2.5* 2.1* 1.8*    Recent Labs  06/03/16 2204  06/04/16 0657  09/08/16 1309  09/13/16 0514 09/14/16 0426 09/18/16 0413  WBC 35.0*  < > 42.4*  < > 27.8*  < > 15.7* 16.8* 23.7*  NEUTROABS 31.8*  --  37.4*  --  20.0*  --   --   --   --   HGB 14.8  < > 12.9  < > 14.1  < > 10.6* 10.8* 11.3*  HCT 45.5  < > 39.5  < > 41.3  < > 32.2* 32.8* 36.1  MCV 94.0  --  93.6  < > 99.5  < > 100.9* 100.9* 106.5*  PLT 297  < > 275  < > 433*  < > 300 317 298  < > = values in this interval not displayed. No results for input(s): CHOL, LDLCALC, TRIG in the last 8760 hours.  Invalid input(s): HCL No results found for: [facility] Lab Results  Component Value Date   TSH 3.04 12/27/2014   No results found for: HGBA1C Lab Results  Component Value Date   CHOL 117 07/27/2014   HDL 22 (A) 07/27/2014   LDLCALC 72 07/27/2014   TRIG 116 07/27/2014    Significant Diagnostic Results in last 30 days:  Ct Head Wo Contrast  Result Date: 09/16/2016 CLINICAL DATA:  70 year old female with history of fever and sepsis. EXAM: CT HEAD WITHOUT CONTRAST TECHNIQUE: Contiguous axial images were obtained from the base of the skull through the vertex without intravenous contrast. COMPARISON:  Head CT 08/03/2014. FINDINGS: Brain: No evidence of acute infarction,  hemorrhage, hydrocephalus, extra-axial collection or mass lesion/mass effect. Mild cerebral and cerebellar atrophy. More focal area of volume loss in the left parieto-occipital region with associated enlargement of the atrium of the left lateral ventricle, potentially related to perinatal leukomalacia or encephalomalacia from prior infarction. Physiologic calcification of the basal ganglia bilaterally (left greater than right). Vascular: No hyperdense vessel or unexpected calcification. Skull: Hyperostosis. No acute displaced fracture. Large left mastoid effusion. Sinuses/Orbits: No acute finding. Other: None. IMPRESSION: 1. No acute intracranial abnormalities. 2. Large left mastoid effusion. Clinical correlation for signs and symptoms of mastoiditis is recommended. 3. Mild cerebral and cerebellar atrophy. 4. More focal atrophy in the left parieto-occipital region with associated dilatation of the atrium of the left lateral ventricle, similar to prior studies, potentially related to perinatal leukomalacia or prior infarct. Electronically Signed   By: Vinnie Langton M.D.   On: 09/16/2016 10:29   Ct Chest W Contrast  Result Date: 09/11/2016 CLINICAL DATA:  Sepsis EXAM: CT CHEST, ABDOMEN, AND PELVIS WITH CONTRAST TECHNIQUE: Multidetector CT imaging of the chest, abdomen and pelvis was performed following the standard protocol during bolus administration of intravenous contrast. CONTRAST:  149mL ISOVUE-300 IOPAMIDOL (ISOVUE-300) INJECTION 61% COMPARISON:  06/01/2014 FINDINGS: CT CHEST FINDINGS Cardiovascular: There is no evidence of aortic aneurysm, dissection, or intramural hematoma. Mild coronary artery calcifications in the LAD territory. Mitral annular calcification. Mediastinum/Nodes: No evidence of abnormal mediastinal adenopathy no pericardial effusion. No abnormal axillary adenopathy. Lungs/Pleura: Small bilateral pleural effusions. No pneumothorax. There is dependent atelectasis bilaterally.  Hazy  ground-glass throughout the remainder of the lungs likely reflects hypoaeration. Musculoskeletal: There is no vertebral compression deformity in the thoracic spine. CT ABDOMEN PELVIS FINDINGS Hepatobiliary: Diffuse hepatic steatosis.  Postcholecystectomy. Pancreas: There is stranding surrounding the head and body of the pancreas. There is no evidence of pancreatic necrosis or hemorrhage. There is no obvious focal mass in the pancreas. Heterogeneity may simply reflect streak artifact. Spleen: Unremarkable. Adrenals/Urinary Tract: Left kidney remains severely atrophic. Adrenal glands are unremarkable. There is hypertrophy of the right kidney. Bladder is within normal limits. Stomach/Bowel: Gastrostomy tube is in place. There is a foreign body in the stomach that is unchanged compare with the prior study the colon is decompressed. The appendix is within normal limits and contains contrast. There is no obvious mass in the colon. No evidence of small-bowel obstruction. Vascular/Lymphatic: There is no evidence of aortic aneurysm. Mild atherosclerotic calcifications of the aorta and iliac vasculature are noted. No abnormal retroperitoneal adenopathy. Small para-aortic and mesenteric nodes. Reproductive: Uterus contains a single calcification likely related to an old fibroid. No obvious ovarian mass. Other: There is a small amount of free fluid in the upper pelvis and right lower quadrant of the abdomen associated with distal small bowel loops and the uterus. There is also stranding within the mesenteric fat as well as along the course of the ascending and descending colon. There is stranding of the subcutaneous fat as well. These findings may all simply represent volume overload state. Inflammatory process is not excluded. Musculoskeletal: No vertebral compression deformity. IMPRESSION: Small bilateral pleural effusions and dependent atelectasis. Small amount of free fluid in the lower abdomen and pelvis. This is  nonspecific and may be related to volume overload. An inflammatory process is not excluded. Foreign body remains in the stomach Create Gastrostomy tube is in place. There is stranding about the pancreas which may be related to volume overload. A focal inflammatory process is not excluded. Electronically Signed   By: Marybelle Killings M.D.   On: 09/11/2016 12:41   Ct Angio Chest Pe W Or Wo Contrast  Result Date: 09/16/2016 CLINICAL DATA:  70 year old female with history of sepsis and fever. EXAM: CT ANGIOGRAPHY CHEST WITH CONTRAST TECHNIQUE: Multidetector CT imaging of the chest was performed using the standard protocol during bolus administration of intravenous contrast. Multiplanar CT image reconstructions and MIPs were obtained to evaluate the vascular anatomy. CONTRAST:  100 mL of Isovue 370. COMPARISON:  Multiple priors, most recently chest CT 09/11/2016. FINDINGS: Comment: Today's study is limited by considerable patient respiratory motion such that accurate assessment for distal segmental and subsegmental sized emboli is not possible on today's examination. Cardiovascular: Respiratory motion limits assessment of pulmonary vasculature. With these limitations in mind, there is no central, lobar or proximal segmental sized filling defect to suggest pulmonary embolism. More distal segmental and subsegmental sized emboli cannot be entirely excluded. Heart size is normal. There is no significant pericardial fluid, thickening or pericardial calcification. There is aortic atherosclerosis, as well as atherosclerosis of the great vessels of the mediastinum and the coronary arteries, including calcified atherosclerotic plaque in the left main and left circumflex coronary arteries. Calcifications and thickening of the aortic valve. Calcifications of the mitral annulus. Mediastinum/Nodes: No pathologically enlarged mediastinal or hilar lymph nodes. Esophagus is unremarkable in appearance. No axillary lymphadenopathy.  Lungs/Pleura: Trace left pleural effusion lying dependently. Low lung volumes. Patchy areas of very mild ground-glass attenuation and septal thickening noted throughout the lungs bilaterally, favored to largely be artifactual from low lung  volumes, although some developing multifocal airspace disease is not excluded. No definite suspicious appearing pulmonary nodules or masses are noted on today's motion limited examination. Upper Abdomen: Percutaneous gastrostomy tube with tip in the distal body of the stomach. Unusual nearly tubular appearing foreign body is again noted in the stomach measuring approximately 3.1 x 8.9 x 3.3 cm. In retrospect, this foreign body has been present compared to prior CT dating back to [id number] and is of uncertain etiology. Diffuse low-attenuation in the liver suggestive of underlying hepatic steatosis (difficult to say for certain on today's contrast enhanced CT examination. Musculoskeletal: There are no aggressive appearing lytic or blastic lesions noted in the visualized portions of the skeleton. Review of the MIP images confirms the above findings. IMPRESSION: 1. Study is limited secondary to patient respiratory motion. With these limitations in mind there is no central, lobar or proximal segmental sized embolus. Smaller distal segmental and subsegmental sized emboli cannot be entirely excluded. 2. Trace left pleural effusion lying dependently. 3. Low lung volumes with some patchy ill-defined areas of ground-glass attenuation and septal thickening which is largely favored to be artifactual, although the possibility of developing multifocal airspace disease from atypical infection is not excluded. Attention on follow-up chest x-rays is recommended. 4. Aortic atherosclerosis, in addition to left main and left circumflex coronary artery disease. Assessment for potential risk factor modification, dietary therapy or pharmacologic therapy may be warranted, if clinically indicated. 5.  There are calcifications of the aortic valve and mitral annulus. Echocardiographic correlation for evaluation of potential valvular dysfunction may be warranted if clinically indicated. 6. Persistent highly unusual appearing foreign body in the stomach, similar to remote prior studies dating back to 12/29/2010. This remains of uncertain etiology and significance. Electronically Signed   By: Vinnie Langton M.D.   On: 09/16/2016 10:58   Ct Abdomen Pelvis W Contrast  Result Date: 09/11/2016 CLINICAL DATA:  Sepsis EXAM: CT CHEST, ABDOMEN, AND PELVIS WITH CONTRAST TECHNIQUE: Multidetector CT imaging of the chest, abdomen and pelvis was performed following the standard protocol during bolus administration of intravenous contrast. CONTRAST:  152mL ISOVUE-300 IOPAMIDOL (ISOVUE-300) INJECTION 61% COMPARISON:  06/01/2014 FINDINGS: CT CHEST FINDINGS Cardiovascular: There is no evidence of aortic aneurysm, dissection, or intramural hematoma. Mild coronary artery calcifications in the LAD territory. Mitral annular calcification. Mediastinum/Nodes: No evidence of abnormal mediastinal adenopathy no pericardial effusion. No abnormal axillary adenopathy. Lungs/Pleura: Small bilateral pleural effusions. No pneumothorax. There is dependent atelectasis bilaterally. Hazy ground-glass throughout the remainder of the lungs likely reflects hypoaeration. Musculoskeletal: There is no vertebral compression deformity in the thoracic spine. CT ABDOMEN PELVIS FINDINGS Hepatobiliary: Diffuse hepatic steatosis.  Postcholecystectomy. Pancreas: There is stranding surrounding the head and body of the pancreas. There is no evidence of pancreatic necrosis or hemorrhage. There is no obvious focal mass in the pancreas. Heterogeneity may simply reflect streak artifact. Spleen: Unremarkable. Adrenals/Urinary Tract: Left kidney remains severely atrophic. Adrenal glands are unremarkable. There is hypertrophy of the right kidney. Bladder is within  normal limits. Stomach/Bowel: Gastrostomy tube is in place. There is a foreign body in the stomach that is unchanged compare with the prior study the colon is decompressed. The appendix is within normal limits and contains contrast. There is no obvious mass in the colon. No evidence of small-bowel obstruction. Vascular/Lymphatic: There is no evidence of aortic aneurysm. Mild atherosclerotic calcifications of the aorta and iliac vasculature are noted. No abnormal retroperitoneal adenopathy. Small para-aortic and mesenteric nodes. Reproductive: Uterus contains a single calcification likely related to an  old fibroid. No obvious ovarian mass. Other: There is a small amount of free fluid in the upper pelvis and right lower quadrant of the abdomen associated with distal small bowel loops and the uterus. There is also stranding within the mesenteric fat as well as along the course of the ascending and descending colon. There is stranding of the subcutaneous fat as well. These findings may all simply represent volume overload state. Inflammatory process is not excluded. Musculoskeletal: No vertebral compression deformity. IMPRESSION: Small bilateral pleural effusions and dependent atelectasis. Small amount of free fluid in the lower abdomen and pelvis. This is nonspecific and may be related to volume overload. An inflammatory process is not excluded. Foreign body remains in the stomach Create Gastrostomy tube is in place. There is stranding about the pancreas which may be related to volume overload. A focal inflammatory process is not excluded. Electronically Signed   By: Marybelle Killings M.D.   On: 09/11/2016 12:41   Dg Chest Port 1 View  Result Date: 09/12/2016 CLINICAL DATA:  Shortness of Breath EXAM: PORTABLE CHEST 1 VIEW COMPARISON:  09/11/2016 FINDINGS: Cardiac shadow is mildly enlarged but stable. The overall inspiratory effort is poor with crowding of the vascular markings. Bibasilar atelectatic changes are seen.  The known pleural effusions are not as well appreciated as on the prior CT examination. No bony abnormality is seen. IMPRESSION: Stable bibasilar atelectasis. Overall poor inspiratory effort with crowding of the vascular markings. The known pleural effusions are not well appreciated. Electronically Signed   By: Inez Catalina M.D.   On: 09/12/2016 14:17   Dg Chest Portable 1 View  Result Date: 09/08/2016 CLINICAL DATA:  70 year old female with a history of sepsis EXAM: PORTABLE CHEST 1 VIEW COMPARISON:  06/03/2016, 02/08/2016, 11/11/2014 FINDINGS: Significant right rotation somewhat limits evaluation. Low lung volumes accentuating the interstitium. Interstitial opacities bilaterally. No pneumothorax identified.  No large pleural effusion. No large confluent airspace disease. IMPRESSION: Limited exam given the positioning with low lung volumes and likely atelectasis. No definite lobar pneumonia. Signed, Dulcy Fanny. Earleen Newport, DO [facility]iology Electronically Signed   By: Corrie Mckusick D.O.   On: 09/08/2016 13:31   US Abdomen Limited Ruq  Result Date: 09/09/2016 CLINICAL DATA:  Sepsis.  Prior cholecystectomy. EXAM: US ABDOMEN LIMITED - RIGHT UPPER QUADRANT COMPARISON:  None. FINDINGS: Limited exam due to body habitus. Gallbladder: Surgically absent. Common bile duct: Diameter: 2.3 mm Liver: Diffusely increased in echogenicity. IMPRESSION: Surgically absent gallbladder.  No biliary ductal dilatation. Hepatic steatosis. Electronically Signed   By: Lovey Newcomer M.D.   On: 09/09/2016 10:44    Assessment and Plan  No problem-specific Assessment & Plan notes found for this encounter.   Labs/tests ordered:    Noah Delaine. Sheppard Coil, MD

## 2016-09-28 NOTE — Progress Notes (Signed)
Location:  [facility] Room Number: [phone] Place of Service:  SNF [phone])  Inocencio Homes, MD  Patient Care Team: Hennie Duos, MD as PCP - General (Internal Medicine)  Extended Emergency Contact Information Primary Emergency Contact: Sellars,[name] Address: [address]          [place] [address] [place] Phone: [phone] Relation: Sister Secondary Emergency Contact: Jean Cower [place] Phone: [phone] Relation: None    Allergies: Carvedilol and Ppd [tuberculin purified protein derivative]  Chief Complaint  Patient presents with  . Acute Visit    Acute    HPI: Patient is 70 y.o. female who is being seen acutely because she appears to be transitioning. 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 I was asked by the DON to join her, Hospice and pt's sister and POA to discuss trying to get the feeding stopped and other comfort measures put into place.  Past Medical History:  Diagnosis Date  . Anemia   . Anginal pain ([place])   . Cataract   . Cataracts, bilateral   . CHF (congestive heart failure) ([place])   . Coronary artery disease   . Dementia   . Dental caries   . Dysphagia   . Esophageal reflux   . High cholesterol   . Hyperlipidemia   . Hypotension   . Hypothyroidism   . Optic atrophy   . Optic atrophy   . Osteoporosis   . 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 Paroxysmal atrial fibrillation (HCC)   . Seizures ([place])   . Sepsis ([place]) 01/2016  . Thyroid disease    hypothyroid  . Unspecified convulsions ([place])     Past Surgical History:  Procedure Laterality Date  . CENTRAL VENOUS CATHETER INSERTION  05/15/2014      . ESOPHAGOGASTRODUODENOSCOPY N/A 06/03/2014   Procedure: ESOPHAGOGASTRODUODENOSCOPY (EGD);  Surgeon: Beryle Beams, MD;  Location: [facility] ENDOSCOPY;   Service: Endoscopy;  Laterality: N/A;  . GASTROSTOMY TUBE PLACEMENT    . midline incision      Allergies as of 09/28/2016      Reactions   Carvedilol    Ppd [tuberculin Purified Protein Derivative]    Per [facility]      Medication List       Accurate as of 09/28/16  3:34 PM. Always use your most recent med list.          acetaminophen 650 MG suppository Commonly known as:  TYLENOL Place 1 suppository (650 mg total) rectally every 6 (six) hours as needed for mild pain (or Fever >/= 101).   clonazePAM 1 MG tablet Commonly known as:  KLONOPIN Place 1 mg into feeding tube every 8 (eight) hours.   docusate 50 MG/5ML liquid Commonly known as:  COLACE Place 20 mLs (200 mg total) into feeding tube 2 (two) times daily.   feeding supplement (PRO-STAT SUGAR FREE 64) Liqd Place 30 mLs into feeding tube daily.   free water Soln Place 100 mLs into feeding tube every 4 (four) hours.   furosemide 20 MG tablet Commonly known as:  LASIX 2 tablets (40 mg total) by Per J Tube route 2 (two) times daily.   levETIRAcetam 100 MG/ML solution Commonly known as:  KEPPRA Place 5 mLs (500 mg total) into feeding tube daily at 6 (  six) AM.   levothyroxine 100 MCG tablet Commonly known as:  SYNTHROID, LEVOTHROID 100 mcg by PEG Tube route daily before breakfast.   NUTRITIONAL SUPPLEMENT PO Two cal hn via peg at 39 cc/hr x 24 hours continuously   Valproic Acid 250 MG/5ML Syrp syrup Commonly known as:  DEPAKENE Place 15 mLs (750 mg total) into feeding tube 3 (three) times daily.       No orders of the defined types were placed in this encounter.   Immunization History  Administered Date(s) Administered  . Influenza-Unspecified 08/13/2014, 07/11/2015, 07/13/2016    Social History  Substance Use Topics  . Smoking status: Never Smoker  . Smokeless tobacco: Never Used  . Alcohol use No    Review of Systems  UTO 2/2 vegetative state; I have concerns about feeding tube, hospice believes  pt is transitioning    Vitals:   09/28/16 1533  BP: 94/63  Pulse: 91  Resp: 20  Temp: 97 F (36.1 C)   Body mass index is 28.51 kg/m. Physical Exam  GENERAL APPEARANCE: Alert, conversant, No acute distress  SKIN: No diaphoresis rash HEENT: Unremarkable RESPIRATORY: Breathing is even, unlabored. Lung sounds are clear   CARDIOVASCULAR: Heart RRR no murmurs, rubs or gallops. No peripheral edema  GASTROINTESTINAL: Abdomen is soft, non-tender, not distended w/ normal bowel sounds.  GENITOURINARY: Bladder non tender, not distended  MUSCULOSKELETAL: No abnormal joints or musculature NEUROLOGIC: Cranial nerves 2-12 grossly intact. Moves all extremities PSYCHIATRIC: Mood and affect appropriate to situation, no behavioral issues  Patient Active Problem List   Diagnosis Date Noted  . Severe sepsis with septic shock ([place]) 09/18/2016  . Mental retardation 09/18/2016  . Acute kidney injury ([place]) 09/18/2016  . Seizures ([place]) 09/18/2016  . Dementia 09/18/2016  . 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 At risk for shortness of breath   . Acute pulmonary edema (HCC)   . Acute respiratory failure with hypoxia ([place])   . Leukocytosis 07/26/2014  . PEG (percutaneous endoscopic gastrostomy) status ([place]) 06/12/2014  . Seizure disorder ([place]) 10/28/2013  . Hypothyroidism 10/28/2013    CMP     Component Value Date/Time   NA 148 (H) 09/18/2016 0413   NA 136 (A) 06/13/2016   NA 136 (A) 06/13/2016   K 5.2 (H) 09/18/2016 0413   CL 101 09/18/2016 0413   CO2 29 09/18/2016 0413   GLUCOSE 137 (H) 09/18/2016 0413   BUN 76 (H) 09/18/2016 0413   BUN 11 06/13/2016   BUN 11 06/13/2016   CREATININE 1.70 (H) 09/18/2016 0413   CALCIUM 8.8 (L) 09/18/2016 0413   PROT 6.7 09/12/2016 1353   ALBUMIN 1.8 (L) 09/12/2016 1353   AST 98 (H) 09/12/2016 1353   ALT 43 09/12/2016 1353   ALKPHOS 394 (H) 09/12/2016 1353   BILITOT 1.5 (H) 09/12/2016 1353   GFRNONAA 29 (L) 09/18/2016 0413   GFRAA 34 (L)  09/18/2016 0413    Recent Labs  02/12/16 1109 02/12/16 1542  09/12/16 1353 09/14/16 0426 09/18/16 0413  NA  --   --   < > 138 141 148*  K  --  3.6  < > 3.3* 4.1 5.2*  CL  --   --   < > 107 98* 101  CO2  --   --   < > 24 30 29   GLUCOSE  --   --   < > 119* 149* 137*  BUN  --   --   < > 13 20 76*  CREATININE  --   --   < >  0.55 0.70 1.70*  CALCIUM  --   --   < > 8.2* 8.2* 8.8*  MG 2.0 2.0  --   --   --   --   < > = values in this interval not displayed.  Recent Labs  06/04/16 0657 09/08/16 1309 09/12/16 1353  AST 30 161* 98*  ALT 18 54 43  ALKPHOS 91 347* 394*  BILITOT 0.5 2.1* 1.5*  PROT 7.4 7.6 6.7  ALBUMIN 2.5* 2.1* 1.8*    Recent Labs  06/03/16 2204  06/04/16 0657  09/08/16 1309  09/13/16 0514 09/14/16 0426 09/18/16 0413  WBC 35.0*  < > 42.4*  < > 27.8*  < > 15.7* 16.8* 23.7*  NEUTROABS 31.8*  --  37.4*  --  20.0*  --   --   --   --   HGB 14.8  < > 12.9  < > 14.1  < > 10.6* 10.8* 11.3*  HCT 45.5  < > 39.5  < > 41.3  < > 32.2* 32.8* 36.1  MCV 94.0  --  93.6  < > 99.5  < > 100.9* 100.9* 106.5*  PLT 297  < > 275  < > 433*  < > 300 317 298  < > = values in this interval not displayed. No results for input(s): CHOL, LDLCALC, TRIG in the last 8760 hours.  Invalid input(s): HCL No results found for: [facility] Lab Results  Component Value Date   TSH 3.04 12/27/2014   No results found for: HGBA1C Lab Results  Component Value Date   CHOL 117 07/27/2014   HDL 22 (A) 07/27/2014   LDLCALC 72 07/27/2014   TRIG 116 07/27/2014    Significant Diagnostic Results in last 30 days:  Ct Head Wo Contrast  Result Date: 09/16/2016 CLINICAL DATA:  70 year old female with history of fever and sepsis. EXAM: CT HEAD WITHOUT CONTRAST TECHNIQUE: Contiguous axial images were obtained from the base of the skull through the vertex without intravenous contrast. COMPARISON:  Head CT 08/03/2014. FINDINGS: Brain: No evidence of acute infarction, hemorrhage, hydrocephalus, extra-axial  collection or mass lesion/mass effect. Mild cerebral and cerebellar atrophy. More focal area of volume loss in the left parieto-occipital region with associated enlargement of the atrium of the left lateral ventricle, potentially related to perinatal leukomalacia or encephalomalacia from prior infarction. Physiologic calcification of the basal ganglia bilaterally (left greater than right). Vascular: No hyperdense vessel or unexpected calcification. Skull: Hyperostosis. No acute displaced fracture. Large left mastoid effusion. Sinuses/Orbits: No acute finding. Other: None. IMPRESSION: 1. No acute intracranial abnormalities. 2. Large left mastoid effusion. Clinical correlation for signs and symptoms of mastoiditis is recommended. 3. Mild cerebral and cerebellar atrophy. 4. More focal atrophy in the left parieto-occipital region with associated dilatation of the atrium of the left lateral ventricle, similar to prior studies, potentially related to perinatal leukomalacia or prior infarct. Electronically Signed   By: Vinnie Langton M.D.   On: 09/16/2016 10:29   Ct Chest W Contrast  Result Date: 09/11/2016 CLINICAL DATA:  Sepsis EXAM: CT CHEST, ABDOMEN, AND PELVIS WITH CONTRAST TECHNIQUE: Multidetector CT imaging of the chest, abdomen and pelvis was performed following the standard protocol during bolus administration of intravenous contrast. CONTRAST:  152mL ISOVUE-300 IOPAMIDOL (ISOVUE-300) INJECTION 61% COMPARISON:  06/01/2014 FINDINGS: CT CHEST FINDINGS Cardiovascular: There is no evidence of aortic aneurysm, dissection, or intramural hematoma. Mild coronary artery calcifications in the LAD territory. Mitral annular calcification. Mediastinum/Nodes: No evidence of abnormal mediastinal adenopathy no pericardial effusion. No abnormal  axillary adenopathy. Lungs/Pleura: Small bilateral pleural effusions. No pneumothorax. There is dependent atelectasis bilaterally. Hazy ground-glass throughout the remainder of the  lungs likely reflects hypoaeration. Musculoskeletal: There is no vertebral compression deformity in the thoracic spine. CT ABDOMEN PELVIS FINDINGS Hepatobiliary: Diffuse hepatic steatosis.  Postcholecystectomy. Pancreas: There is stranding surrounding the head and body of the pancreas. There is no evidence of pancreatic necrosis or hemorrhage. There is no obvious focal mass in the pancreas. Heterogeneity may simply reflect streak artifact. Spleen: Unremarkable. Adrenals/Urinary Tract: Left kidney remains severely atrophic. Adrenal glands are unremarkable. There is hypertrophy of the right kidney. Bladder is within normal limits. Stomach/Bowel: Gastrostomy tube is in place. There is a foreign body in the stomach that is unchanged compare with the prior study the colon is decompressed. The appendix is within normal limits and contains contrast. There is no obvious mass in the colon. No evidence of small-bowel obstruction. Vascular/Lymphatic: There is no evidence of aortic aneurysm. Mild atherosclerotic calcifications of the aorta and iliac vasculature are noted. No abnormal retroperitoneal adenopathy. Small para-aortic and mesenteric nodes. Reproductive: Uterus contains a single calcification likely related to an old fibroid. No obvious ovarian mass. Other: There is a small amount of free fluid in the upper pelvis and right lower quadrant of the abdomen associated with distal small bowel loops and the uterus. There is also stranding within the mesenteric fat as well as along the course of the ascending and descending colon. There is stranding of the subcutaneous fat as well. These findings may all simply represent volume overload state. Inflammatory process is not excluded. Musculoskeletal: No vertebral compression deformity. IMPRESSION: Small bilateral pleural effusions and dependent atelectasis. Small amount of free fluid in the lower abdomen and pelvis. This is nonspecific and may be related to volume overload. An  inflammatory process is not excluded. Foreign body remains in the stomach Create Gastrostomy tube is in place. There is stranding about the pancreas which may be related to volume overload. A focal inflammatory process is not excluded. Electronically Signed   By: Marybelle Killings M.D.   On: 09/11/2016 12:41   Ct Angio Chest Pe W Or Wo Contrast  Result Date: 09/16/2016 CLINICAL DATA:  70 year old female with history of sepsis and fever. EXAM: CT ANGIOGRAPHY CHEST WITH CONTRAST TECHNIQUE: Multidetector CT imaging of the chest was performed using the standard protocol during bolus administration of intravenous contrast. Multiplanar CT image reconstructions and MIPs were obtained to evaluate the vascular anatomy. CONTRAST:  100 mL of Isovue 370. COMPARISON:  Multiple priors, most recently chest CT 09/11/2016. FINDINGS: Comment: Today's study is limited by considerable patient respiratory motion such that accurate assessment for distal segmental and subsegmental sized emboli is not possible on today's examination. Cardiovascular: Respiratory motion limits assessment of pulmonary vasculature. With these limitations in mind, there is no central, lobar or proximal segmental sized filling defect to suggest pulmonary embolism. More distal segmental and subsegmental sized emboli cannot be entirely excluded. Heart size is normal. There is no significant pericardial fluid, thickening or pericardial calcification. There is aortic atherosclerosis, as well as atherosclerosis of the great vessels of the mediastinum and the coronary arteries, including calcified atherosclerotic plaque in the left main and left circumflex coronary arteries. Calcifications and thickening of the aortic valve. Calcifications of the mitral annulus. Mediastinum/Nodes: No pathologically enlarged mediastinal or hilar lymph nodes. Esophagus is unremarkable in appearance. No axillary lymphadenopathy. Lungs/Pleura: Trace left pleural effusion lying dependently.  Low lung volumes. Patchy areas of very mild ground-glass attenuation and septal  thickening noted throughout the lungs bilaterally, favored to largely be artifactual from low lung volumes, although some developing multifocal airspace disease is not excluded. No definite suspicious appearing pulmonary nodules or masses are noted on today's motion limited examination. Upper Abdomen: Percutaneous gastrostomy tube with tip in the distal body of the stomach. Unusual nearly tubular appearing foreign body is again noted in the stomach measuring approximately 3.1 x 8.9 x 3.3 cm. In retrospect, this foreign body has been present compared to prior CT dating back to [id number] and is of uncertain etiology. Diffuse low-attenuation in the liver suggestive of underlying hepatic steatosis (difficult to say for certain on today's contrast enhanced CT examination. Musculoskeletal: There are no aggressive appearing lytic or blastic lesions noted in the visualized portions of the skeleton. Review of the MIP images confirms the above findings. IMPRESSION: 1. Study is limited secondary to patient respiratory motion. With these limitations in mind there is no central, lobar or proximal segmental sized embolus. Smaller distal segmental and subsegmental sized emboli cannot be entirely excluded. 2. Trace left pleural effusion lying dependently. 3. Low lung volumes with some patchy ill-defined areas of ground-glass attenuation and septal thickening which is largely favored to be artifactual, although the possibility of developing multifocal airspace disease from atypical infection is not excluded. Attention on follow-up chest x-rays is recommended. 4. Aortic atherosclerosis, in addition to left main and left circumflex coronary artery disease. Assessment for potential risk factor modification, dietary therapy or pharmacologic therapy may be warranted, if clinically indicated. 5. There are calcifications of the aortic valve and mitral annulus.  Echocardiographic correlation for evaluation of potential valvular dysfunction may be warranted if clinically indicated. 6. Persistent highly unusual appearing foreign body in the stomach, similar to remote prior studies dating back to 12/29/2010. This remains of uncertain etiology and significance. Electronically Signed   By: Vinnie Langton M.D.   On: 09/16/2016 10:58   Ct Abdomen Pelvis W Contrast  Result Date: 09/11/2016 CLINICAL DATA:  Sepsis EXAM: CT CHEST, ABDOMEN, AND PELVIS WITH CONTRAST TECHNIQUE: Multidetector CT imaging of the chest, abdomen and pelvis was performed following the standard protocol during bolus administration of intravenous contrast. CONTRAST:  190mL ISOVUE-300 IOPAMIDOL (ISOVUE-300) INJECTION 61% COMPARISON:  06/01/2014 FINDINGS: CT CHEST FINDINGS Cardiovascular: There is no evidence of aortic aneurysm, dissection, or intramural hematoma. Mild coronary artery calcifications in the LAD territory. Mitral annular calcification. Mediastinum/Nodes: No evidence of abnormal mediastinal adenopathy no pericardial effusion. No abnormal axillary adenopathy. Lungs/Pleura: Small bilateral pleural effusions. No pneumothorax. There is dependent atelectasis bilaterally. Hazy ground-glass throughout the remainder of the lungs likely reflects hypoaeration. Musculoskeletal: There is no vertebral compression deformity in the thoracic spine. CT ABDOMEN PELVIS FINDINGS Hepatobiliary: Diffuse hepatic steatosis.  Postcholecystectomy. Pancreas: There is stranding surrounding the head and body of the pancreas. There is no evidence of pancreatic necrosis or hemorrhage. There is no obvious focal mass in the pancreas. Heterogeneity may simply reflect streak artifact. Spleen: Unremarkable. Adrenals/Urinary Tract: Left kidney remains severely atrophic. Adrenal glands are unremarkable. There is hypertrophy of the right kidney. Bladder is within normal limits. Stomach/Bowel: Gastrostomy tube is in place. There is  a foreign body in the stomach that is unchanged compare with the prior study the colon is decompressed. The appendix is within normal limits and contains contrast. There is no obvious mass in the colon. No evidence of small-bowel obstruction. Vascular/Lymphatic: There is no evidence of aortic aneurysm. Mild atherosclerotic calcifications of the aorta and iliac vasculature are noted. No abnormal retroperitoneal adenopathy. Small  para-aortic and mesenteric nodes. Reproductive: Uterus contains a single calcification likely related to an old fibroid. No obvious ovarian mass. Other: There is a small amount of free fluid in the upper pelvis and right lower quadrant of the abdomen associated with distal small bowel loops and the uterus. There is also stranding within the mesenteric fat as well as along the course of the ascending and descending colon. There is stranding of the subcutaneous fat as well. These findings may all simply represent volume overload state. Inflammatory process is not excluded. Musculoskeletal: No vertebral compression deformity. IMPRESSION: Small bilateral pleural effusions and dependent atelectasis. Small amount of free fluid in the lower abdomen and pelvis. This is nonspecific and may be related to volume overload. An inflammatory process is not excluded. Foreign body remains in the stomach Create Gastrostomy tube is in place. There is stranding about the pancreas which may be related to volume overload. A focal inflammatory process is not excluded. Electronically Signed   By: Marybelle Killings M.D.   On: 09/11/2016 12:41   Dg Chest Port 1 View  Result Date: 09/12/2016 CLINICAL DATA:  Shortness of Breath EXAM: PORTABLE CHEST 1 VIEW COMPARISON:  09/11/2016 FINDINGS: Cardiac shadow is mildly enlarged but stable. The overall inspiratory effort is poor with crowding of the vascular markings. Bibasilar atelectatic changes are seen. The known pleural effusions are not as well appreciated as on the  prior CT examination. No bony abnormality is seen. IMPRESSION: Stable bibasilar atelectasis. Overall poor inspiratory effort with crowding of the vascular markings. The known pleural effusions are not well appreciated. Electronically Signed   By: Inez Catalina M.D.   On: 09/12/2016 14:17   Dg Chest Portable 1 View  Result Date: 09/08/2016 CLINICAL DATA:  70 year old female with a history of sepsis EXAM: PORTABLE CHEST 1 VIEW COMPARISON:  06/03/2016, 02/08/2016, 11/11/2014 FINDINGS: Significant right rotation somewhat limits evaluation. Low lung volumes accentuating the interstitium. Interstitial opacities bilaterally. No pneumothorax identified.  No large pleural effusion. No large confluent airspace disease. IMPRESSION: Limited exam given the positioning with low lung volumes and likely atelectasis. No definite lobar pneumonia. Signed, Dulcy Fanny. Earleen Newport, DO [facility] Radiology Electronically Signed   By: Corrie Mckusick D.O.   On: 09/08/2016 13:31   US Abdomen Limited Ruq  Result Date: 09/09/2016 CLINICAL DATA:  Sepsis.  Prior cholecystectomy. EXAM: US ABDOMEN LIMITED - RIGHT UPPER QUADRANT COMPARISON:  None. FINDINGS: Limited exam due to body habitus. Gallbladder: Surgically absent. Common bile duct: Diameter: 2.3 mm Liver: Diffusely increased in echogenicity. IMPRESSION: Surgically absent gallbladder.  No biliary ductal dilatation. Hepatic steatosis. Electronically Signed   By: Lovey Newcomer M.D.   On: 09/09/2016 10:44    Assessment and Plan  ENCOUNTER FOR FAMILY CONFERENCE/ DYING PATIENT - pt's sister has in the past been resistant to any but the most aggressive treatment for this pt with a very poor quality of life for years; however we were able to make her see the futility of feeding, that her GI tract had stopped working and feeding solution was pooling in stomach, then esophagus, then out of her mouth so she agreed to stop the feeding.  After this I  spoke to [place] by phone to stop the water flushes and wrote orders to stop all meds except the seizure meds and her klonopin, which withdrawal could precipitate a seizure. Pt already had morphine but appeared comfortable; will monitor for need for morphine     Time spent > 45 min  Noah Delaine. Sheppard Coil, MD

## 2016-10-09 ENCOUNTER — Encounter: Payer: Self-pay | Admitting: Internal Medicine

## 2016-10-10 NOTE — Progress Notes (Signed)
This encounter was created in error - please disregard.

## 2016-10-15 DEATH — deceased

## 2016-10-20 ENCOUNTER — Encounter: Payer: Self-pay | Admitting: Internal Medicine

## 2016-10-20 NOTE — Progress Notes (Signed)
Location:  [facility] [place] [facility] Service:  SNF (31)SNF  Inocencio Homes, MD  Patient Care Team: Hennie Duos, MD as PCP - General (Internal Medicine)  Extended Emergency Contact Information Primary Emergency Contact: Sellars,[name] Address: [address]          [place] [address] [place] Phone: [phone] Relation: Sister Secondary Emergency Contact: Bionca Cower [place] Phone: [phone] Relation: None    Allergies: Carvedilol and Ppd [tuberculin purified protein derivative]  Chief Complaint  Patient presents with  . Acute Visit    HPI: Patient is 71 y.o. female who Hospice has made me aware is transitioning today.   Past Medical History:  Diagnosis Date  . Anemia   . Anginal pain ([place])   . Cataract   . Cataracts, bilateral   . CHF (congestive heart failure) ([place])   . Coronary artery disease   . Dementia   . Dental caries   . Dysphagia   . Esophageal reflux   . High cholesterol   . Hyperlipidemia   . Hypotension   . Hypothyroidism   . Optic atrophy   . Optic atrophy   . Osteoporosis   . 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 Paroxysmal atrial fibrillation (HCC)   . Seizures ([place])   . Sepsis ([place]) 01/2016  . Thyroid disease    hypothyroid  . Unspecified convulsions ([place])     Past Surgical History:  Procedure Laterality Date  . CENTRAL VENOUS CATHETER INSERTION  05/15/2014      . ESOPHAGOGASTRODUODENOSCOPY N/A 06/03/2014   Procedure: ESOPHAGOGASTRODUODENOSCOPY (EGD);  Surgeon: Beryle Beams, MD;  Location: [facility] ENDOSCOPY;  Service: Endoscopy;  Laterality: N/A;  . GASTROSTOMY TUBE PLACEMENT    . midline incision      Allergies as of 09/27/2016      Reactions   Carvedilol    Ppd [tuberculin Purified Protein Derivative]    Per [facility]      Medication List       Accurate as of 09/27/16 11:59 PM. Always use your most recent med list.          acetaminophen 650 MG  suppository Commonly known as:  TYLENOL Place 1 suppository (650 mg total) rectally every 6 (six) hours as needed for mild pain (or Fever >/= 101).   clonazePAM 1 MG tablet Commonly known as:  KLONOPIN Place 1 mg into feeding tube every 8 (eight) hours.   docusate 50 MG/5ML liquid Commonly known as:  COLACE Place 20 mLs (200 mg total) into feeding tube 2 (two) times daily.   feeding supplement (PRO-STAT SUGAR FREE 64) Liqd Place 30 mLs into feeding tube daily.   free water Soln Place 100 mLs into feeding tube every 4 (four) hours.   furosemide 20 MG tablet Commonly known as:  LASIX 2 tablets (40 mg total) by Per J Tube route 2 (two) times daily.   levETIRAcetam 100 MG/ML solution Commonly known as:  KEPPRA Place 5 mLs (500 mg total) into feeding tube daily at 6 (six) AM.   levothyroxine 100 MCG tablet Commonly known as:  SYNTHROID, LEVOTHROID 100 mcg by PEG Tube route daily before breakfast.   NUTRITIONAL SUPPLEMENT PO Two cal hn via peg at 39 cc/hr x 24 hours continuously   Valproic Acid 250 MG/5ML Syrp syrup Commonly known as:  DEPAKENE Place 15 mLs (750 mg total) into feeding tube 3 (three) times daily.  No orders of the defined types were placed in this encounter.   Immunization History  Administered Date(s) Administered  . Influenza-Unspecified 08/13/2014, 07/11/2015, 07/13/2016    Social History  Substance Use Topics  . Smoking status: Never Smoker  . Smokeless tobacco: Never Used  . Alcohol use No    Review of Systems  UTO 2/2 unconscious    Vitals:   10/20/16 2011  BP: 94/63  Pulse: 91  Resp: 20  Temp: 97 F (36.1 C)   There is no height or weight on file to calculate BMI. Physical Exam  GENERAL APPEARANCE: not arousable  SKIN: No diaphoresis rash HEENT: Unremarkable RESPIRATORY: Breathing is even, unlabored. Lung sounds are clear   CARDIOVASCULAR: Heart RRR no murmurs, rubs or gallops. No peripheral edema  GASTROINTESTINAL:  Abdomen is soft, non-tender, not distended w/ normal bowel sounds.  GENITOURINARY: Bladder non tender, not distended  MUSCULOSKELETAL: wasting and contractures all extremities NEUROLOGIC: not conscious PSYCHIATRIC: n/a  Patient Active Problem List   Diagnosis Date Noted  . Severe sepsis with septic shock ([place]) 09/18/2016  . Mental retardation 09/18/2016  . Acute kidney injury ([place]) 09/18/2016  . Seizures ([place]) 09/18/2016  . Dementia 09/18/2016  . 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 At risk for shortness of breath   . Acute pulmonary edema (HCC)   . Acute respiratory failure with hypoxia ([place])   . Leukocytosis 07/26/2014  . PEG (percutaneous endoscopic gastrostomy) status ([place]) 06/12/2014  . Seizure disorder ([place]) 10/28/2013  . Hypothyroidism 10/28/2013    CMP     Component Value Date/Time   NA 148 (H) 09/18/2016 0413   NA 136 (A) 06/13/2016   NA 136 (A) 06/13/2016   K 5.2 (H) 09/18/2016 0413   CL 101 09/18/2016 0413   CO2 29 09/18/2016 0413   GLUCOSE 137 (H) 09/18/2016 0413   BUN 76 (H) 09/18/2016 0413   BUN 11 06/13/2016   BUN 11 06/13/2016   CREATININE 1.70 (H) 09/18/2016 0413   CALCIUM 8.8 (L) 09/18/2016 0413   PROT 6.7 09/12/2016 1353   ALBUMIN 1.8 (L) 09/12/2016 1353   AST 98 (H) 09/12/2016 1353   ALT 43 09/12/2016 1353   ALKPHOS 394 (H) 09/12/2016 1353   BILITOT 1.5 (H) 09/12/2016 1353   GFRNONAA 29 (L) 09/18/2016 0413   GFRAA 34 (L) 09/18/2016 0413    Recent Labs  02/12/16 1109 02/12/16 1542  09/12/16 1353 09/14/16 0426 09/18/16 0413  NA  --   --   < > 138 141 148*  K  --  3.6  < > 3.3* 4.1 5.2*  CL  --   --   < > 107 98* 101  CO2  --   --   < > 24 30 29   GLUCOSE  --   --   < > 119* 149* 137*  BUN  --   --   < > 13 20 76*  CREATININE  --   --   < > 0.55 0.70 1.70*  CALCIUM  --   --   < > 8.2* 8.2* 8.8*  MG 2.0 2.0  --   --   --   --   < > = values in this interval not displayed.  Recent Labs  06/04/16 0657 09/08/16 1309  09/12/16 1353  AST 30 161* 98*  ALT 18 54 43  ALKPHOS 91 347* 394*  BILITOT 0.5 2.1* 1.5*  PROT 7.4 7.6 6.7  ALBUMIN 2.5* 2.1* 1.8*    Recent Labs  06/03/16 2204  06/04/16 0657  09/08/16 1309  09/13/16 0514 09/14/16 0426 09/18/16 0413  WBC 35.0*  < > 42.4*  < > 27.8*  < > 15.7* 16.8* 23.7*  NEUTROABS 31.8*  --  37.4*  --  20.0*  --   --   --   --   HGB 14.8  < > 12.9  < > 14.1  < > 10.6* 10.8* 11.3*  HCT 45.5  < > 39.5  < > 41.3  < > 32.2* 32.8* 36.1  MCV 94.0  --  93.6  < > 99.5  < > 100.9* 100.9* 106.5*  PLT 297  < > 275  < > 433*  < > 300 317 298  < > = values in this interval not displayed. No results for input(s): CHOL, LDLCALC, TRIG in the last 8760 hours.  Invalid input(s): HCL No results found for: [facility] Lab Results  Component Value Date   TSH 3.04 12/27/2014   No results found for: HGBA1C Lab Results  Component Value Date   CHOL 117 07/27/2014   HDL 22 (A) 07/27/2014   LDLCALC 72 07/27/2014   TRIG 116 07/27/2014    Significant Diagnostic Results in last 30 days:  No results found.  Assessment and Plan  DYING CARE - will stop all meds except keppra, valproic acid and klonopin. Morphine is witten fr pt but I doubt she will need any, she looks very comfortable.    Inocencio Homes, MD
